# Patient Record
Sex: Female | Born: 1985 | Race: White | Hispanic: No | Smoking: Current every day smoker
Health system: Southern US, Community
[De-identification: ages and names within clinical notes are randomized; demographics above are authoritative.]

## PROBLEM LIST (undated history)

## (undated) DIAGNOSIS — F329 Major depressive disorder, single episode, unspecified: Secondary | ICD-10-CM

## (undated) DIAGNOSIS — K219 Gastro-esophageal reflux disease without esophagitis: Secondary | ICD-10-CM

## (undated) DIAGNOSIS — F909 Attention-deficit hyperactivity disorder, unspecified type: Secondary | ICD-10-CM

## (undated) DIAGNOSIS — F1910 Other psychoactive substance abuse, uncomplicated: Secondary | ICD-10-CM

## (undated) DIAGNOSIS — R011 Cardiac murmur, unspecified: Secondary | ICD-10-CM

## (undated) DIAGNOSIS — K589 Irritable bowel syndrome without diarrhea: Secondary | ICD-10-CM

## (undated) DIAGNOSIS — J343 Hypertrophy of nasal turbinates: Secondary | ICD-10-CM

## (undated) DIAGNOSIS — M26629 Arthralgia of temporomandibular joint, unspecified side: Secondary | ICD-10-CM

## (undated) DIAGNOSIS — J342 Deviated nasal septum: Secondary | ICD-10-CM

## (undated) DIAGNOSIS — K7689 Other specified diseases of liver: Secondary | ICD-10-CM

## (undated) DIAGNOSIS — Z98811 Dental restoration status: Secondary | ICD-10-CM

## (undated) DIAGNOSIS — F429 Obsessive-compulsive disorder, unspecified: Secondary | ICD-10-CM

## (undated) DIAGNOSIS — M797 Fibromyalgia: Secondary | ICD-10-CM

## (undated) DIAGNOSIS — I10 Essential (primary) hypertension: Secondary | ICD-10-CM

## (undated) DIAGNOSIS — L309 Dermatitis, unspecified: Secondary | ICD-10-CM

## (undated) DIAGNOSIS — F32A Depression, unspecified: Secondary | ICD-10-CM

## (undated) DIAGNOSIS — A64 Unspecified sexually transmitted disease: Secondary | ICD-10-CM

## (undated) DIAGNOSIS — R87619 Unspecified abnormal cytological findings in specimens from cervix uteri: Secondary | ICD-10-CM

## (undated) DIAGNOSIS — F319 Bipolar disorder, unspecified: Secondary | ICD-10-CM

## (undated) DIAGNOSIS — G43909 Migraine, unspecified, not intractable, without status migrainosus: Secondary | ICD-10-CM

## (undated) DIAGNOSIS — F419 Anxiety disorder, unspecified: Secondary | ICD-10-CM

## (undated) HISTORY — DX: Depression, unspecified: F32.A

## (undated) HISTORY — PX: COARCTATION OF AORTA REPAIR: SHX261

## (undated) HISTORY — DX: Unspecified abnormal cytological findings in specimens from cervix uteri: R87.619

## (undated) HISTORY — DX: Migraine, unspecified, not intractable, without status migrainosus: G43.909

## (undated) HISTORY — DX: Essential (primary) hypertension: I10

## (undated) HISTORY — DX: Bipolar disorder, unspecified: F31.9

## (undated) HISTORY — PX: ASD AND VSD REPAIR: SHX257

## (undated) HISTORY — DX: Anxiety disorder, unspecified: F41.9

## (undated) HISTORY — DX: Attention-deficit hyperactivity disorder, unspecified type: F90.9

## (undated) HISTORY — DX: Major depressive disorder, single episode, unspecified: F32.9

## (undated) HISTORY — DX: Gastro-esophageal reflux disease without esophagitis: K21.9

## (undated) HISTORY — DX: Obsessive-compulsive disorder, unspecified: F42.9

## (undated) HISTORY — DX: Fibromyalgia: M79.7

## (undated) HISTORY — DX: Other specified diseases of liver: K76.89

## (undated) HISTORY — DX: Cardiac murmur, unspecified: R01.1

## (undated) HISTORY — DX: Other psychoactive substance abuse, uncomplicated: F19.10

## (undated) HISTORY — DX: Irritable bowel syndrome, unspecified: K58.9

## (undated) HISTORY — DX: Unspecified sexually transmitted disease: A64

---
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
## 2007-02-05 ENCOUNTER — Ambulatory Visit: Payer: Self-pay | Admitting: *Deleted

## 2007-02-05 ENCOUNTER — Ambulatory Visit: Payer: Self-pay | Admitting: Cardiology

## 2007-02-05 ENCOUNTER — Inpatient Hospital Stay (HOSPITAL_COMMUNITY): Admission: EM | Admit: 2007-02-05 | Discharge: 2007-02-09 | Payer: Self-pay | Admitting: Emergency Medicine

## 2007-02-06 ENCOUNTER — Encounter: Payer: Self-pay | Admitting: Cardiology

## 2007-02-06 ENCOUNTER — Encounter: Payer: Self-pay | Admitting: Internal Medicine

## 2007-02-11 ENCOUNTER — Ambulatory Visit: Payer: Self-pay | Admitting: Gastroenterology

## 2007-02-11 ENCOUNTER — Ambulatory Visit: Payer: Self-pay | Admitting: Internal Medicine

## 2007-02-18 ENCOUNTER — Ambulatory Visit: Payer: Self-pay | Admitting: Gastroenterology

## 2007-04-24 DIAGNOSIS — R079 Chest pain, unspecified: Secondary | ICD-10-CM | POA: Insufficient documentation

## 2007-04-24 DIAGNOSIS — I7090 Unspecified atherosclerosis: Secondary | ICD-10-CM

## 2007-04-24 DIAGNOSIS — Q251 Coarctation of aorta: Secondary | ICD-10-CM | POA: Insufficient documentation

## 2007-04-24 DIAGNOSIS — K219 Gastro-esophageal reflux disease without esophagitis: Secondary | ICD-10-CM | POA: Insufficient documentation

## 2007-04-24 DIAGNOSIS — R143 Flatulence: Secondary | ICD-10-CM

## 2007-04-24 DIAGNOSIS — R142 Eructation: Secondary | ICD-10-CM

## 2007-04-24 DIAGNOSIS — G43909 Migraine, unspecified, not intractable, without status migrainosus: Secondary | ICD-10-CM

## 2007-04-24 DIAGNOSIS — R141 Gas pain: Secondary | ICD-10-CM | POA: Insufficient documentation

## 2007-04-24 DIAGNOSIS — Q210 Ventricular septal defect: Secondary | ICD-10-CM

## 2007-04-24 DIAGNOSIS — I10 Essential (primary) hypertension: Secondary | ICD-10-CM | POA: Insufficient documentation

## 2007-04-29 ENCOUNTER — Ambulatory Visit: Payer: Self-pay | Admitting: Internal Medicine

## 2007-05-11 ENCOUNTER — Emergency Department (HOSPITAL_COMMUNITY): Admission: EM | Admit: 2007-05-11 | Discharge: 2007-05-11 | Payer: Self-pay | Admitting: Emergency Medicine

## 2007-11-26 ENCOUNTER — Ambulatory Visit: Payer: Self-pay | Admitting: Internal Medicine

## 2007-12-08 ENCOUNTER — Ambulatory Visit: Payer: Self-pay | Admitting: Internal Medicine

## 2007-12-08 DIAGNOSIS — J309 Allergic rhinitis, unspecified: Secondary | ICD-10-CM | POA: Insufficient documentation

## 2007-12-08 DIAGNOSIS — K589 Irritable bowel syndrome without diarrhea: Secondary | ICD-10-CM | POA: Insufficient documentation

## 2007-12-08 DIAGNOSIS — Q249 Congenital malformation of heart, unspecified: Secondary | ICD-10-CM

## 2007-12-08 DIAGNOSIS — L259 Unspecified contact dermatitis, unspecified cause: Secondary | ICD-10-CM | POA: Insufficient documentation

## 2007-12-08 DIAGNOSIS — J310 Chronic rhinitis: Secondary | ICD-10-CM | POA: Insufficient documentation

## 2007-12-08 DIAGNOSIS — F329 Major depressive disorder, single episode, unspecified: Secondary | ICD-10-CM

## 2007-12-08 DIAGNOSIS — F411 Generalized anxiety disorder: Secondary | ICD-10-CM | POA: Insufficient documentation

## 2007-12-08 DIAGNOSIS — F418 Other specified anxiety disorders: Secondary | ICD-10-CM | POA: Insufficient documentation

## 2007-12-15 ENCOUNTER — Ambulatory Visit: Payer: Self-pay | Admitting: Internal Medicine

## 2007-12-15 DIAGNOSIS — B9789 Other viral agents as the cause of diseases classified elsewhere: Secondary | ICD-10-CM

## 2007-12-15 LAB — CONVERTED CEMR LAB
Inflenza A Ag: NEGATIVE
Influenza B Ag: NEGATIVE
Rapid Strep: NEGATIVE

## 2008-01-19 ENCOUNTER — Ambulatory Visit: Payer: Self-pay | Admitting: Internal Medicine

## 2008-01-19 DIAGNOSIS — F909 Attention-deficit hyperactivity disorder, unspecified type: Secondary | ICD-10-CM | POA: Insufficient documentation

## 2008-01-19 DIAGNOSIS — F988 Other specified behavioral and emotional disorders with onset usually occurring in childhood and adolescence: Secondary | ICD-10-CM | POA: Insufficient documentation

## 2008-02-07 ENCOUNTER — Ambulatory Visit: Payer: Self-pay | Admitting: Internal Medicine

## 2008-02-10 ENCOUNTER — Encounter (INDEPENDENT_AMBULATORY_CARE_PROVIDER_SITE_OTHER): Payer: Self-pay | Admitting: Internal Medicine

## 2008-02-14 ENCOUNTER — Encounter (INDEPENDENT_AMBULATORY_CARE_PROVIDER_SITE_OTHER): Payer: Self-pay | Admitting: Internal Medicine

## 2008-02-22 ENCOUNTER — Ambulatory Visit: Payer: Self-pay | Admitting: Internal Medicine

## 2008-02-22 DIAGNOSIS — R110 Nausea: Secondary | ICD-10-CM

## 2008-02-22 DIAGNOSIS — R1013 Epigastric pain: Secondary | ICD-10-CM

## 2008-02-22 LAB — CONVERTED CEMR LAB
Beta hcg, urine, semiquantitative: NEGATIVE
Blood in Urine, dipstick: NEGATIVE
Nitrite: NEGATIVE
Protein, U semiquant: NEGATIVE
WBC Urine, dipstick: NEGATIVE
pH: 7

## 2008-02-28 LAB — CONVERTED CEMR LAB
ALT: 10 units/L (ref 0–35)
AST: 13 units/L (ref 0–37)
Basophils Absolute: 0 10*3/uL (ref 0.0–0.1)
Basophils Relative: 0 % (ref 0–1)
CO2: 24 meq/L (ref 19–32)
Creatinine, Ser: 0.72 mg/dL (ref 0.40–1.20)
Eosinophils Relative: 1 % (ref 0–5)
HCT: 40.4 % (ref 36.0–46.0)
Lymphocytes Relative: 14 % (ref 12–46)
MCHC: 33.9 g/dL (ref 30.0–36.0)
Monocytes Absolute: 0.7 10*3/uL (ref 0.1–1.0)
Neutro Abs: 6 10*3/uL (ref 1.7–7.7)
Platelets: 270 10*3/uL (ref 150–400)
RDW: 13.6 % (ref 11.5–15.5)
Sodium: 137 meq/L (ref 135–145)
Total Bilirubin: 0.5 mg/dL (ref 0.3–1.2)
Total Protein: 7 g/dL (ref 6.0–8.3)

## 2008-03-01 ENCOUNTER — Ambulatory Visit (HOSPITAL_COMMUNITY): Admission: RE | Admit: 2008-03-01 | Discharge: 2008-03-01 | Payer: Self-pay | Admitting: Internal Medicine

## 2008-03-09 ENCOUNTER — Encounter (INDEPENDENT_AMBULATORY_CARE_PROVIDER_SITE_OTHER): Payer: Self-pay | Admitting: Internal Medicine

## 2008-03-16 ENCOUNTER — Ambulatory Visit: Payer: Self-pay | Admitting: Gastroenterology

## 2008-03-21 ENCOUNTER — Encounter (HOSPITAL_COMMUNITY): Admission: RE | Admit: 2008-03-21 | Discharge: 2008-03-28 | Payer: Self-pay | Admitting: Gastroenterology

## 2008-03-28 ENCOUNTER — Ambulatory Visit: Payer: Self-pay | Admitting: Licensed Clinical Social Worker

## 2008-03-29 ENCOUNTER — Ambulatory Visit: Payer: Self-pay | Admitting: Gastroenterology

## 2008-03-30 ENCOUNTER — Telehealth (INDEPENDENT_AMBULATORY_CARE_PROVIDER_SITE_OTHER): Payer: Self-pay | Admitting: Internal Medicine

## 2008-03-31 ENCOUNTER — Encounter: Payer: Self-pay | Admitting: Gastroenterology

## 2008-03-31 LAB — CONVERTED CEMR LAB: TSH: 1.299 microintl units/mL (ref 0.350–4.50)

## 2008-04-03 ENCOUNTER — Ambulatory Visit: Payer: Self-pay | Admitting: Licensed Clinical Social Worker

## 2008-04-04 ENCOUNTER — Ambulatory Visit (HOSPITAL_COMMUNITY): Admission: RE | Admit: 2008-04-04 | Discharge: 2008-04-04 | Payer: Self-pay | Admitting: Gastroenterology

## 2008-04-04 ENCOUNTER — Ambulatory Visit: Payer: Self-pay | Admitting: Gastroenterology

## 2008-04-04 ENCOUNTER — Encounter: Payer: Self-pay | Admitting: Gastroenterology

## 2008-04-04 HISTORY — PX: FLEXIBLE SIGMOIDOSCOPY: SHX1649

## 2008-04-04 HISTORY — PX: ESOPHAGOGASTRODUODENOSCOPY: SHX1529

## 2008-04-07 ENCOUNTER — Ambulatory Visit: Payer: Self-pay | Admitting: Internal Medicine

## 2008-04-07 DIAGNOSIS — T148XXA Other injury of unspecified body region, initial encounter: Secondary | ICD-10-CM | POA: Insufficient documentation

## 2008-04-13 ENCOUNTER — Telehealth (INDEPENDENT_AMBULATORY_CARE_PROVIDER_SITE_OTHER): Payer: Self-pay | Admitting: Internal Medicine

## 2008-04-14 ENCOUNTER — Ambulatory Visit: Payer: Self-pay | Admitting: Licensed Clinical Social Worker

## 2008-04-14 ENCOUNTER — Encounter (INDEPENDENT_AMBULATORY_CARE_PROVIDER_SITE_OTHER): Payer: Self-pay | Admitting: Internal Medicine

## 2008-04-28 ENCOUNTER — Encounter (HOSPITAL_COMMUNITY): Admission: RE | Admit: 2008-04-28 | Discharge: 2008-05-28 | Payer: Self-pay | Admitting: Gastroenterology

## 2008-05-03 ENCOUNTER — Encounter (INDEPENDENT_AMBULATORY_CARE_PROVIDER_SITE_OTHER): Payer: Self-pay | Admitting: Internal Medicine

## 2008-05-04 ENCOUNTER — Ambulatory Visit: Payer: Self-pay | Admitting: Licensed Clinical Social Worker

## 2008-05-05 ENCOUNTER — Ambulatory Visit: Payer: Self-pay | Admitting: Gastroenterology

## 2008-05-06 ENCOUNTER — Encounter: Payer: Self-pay | Admitting: Gastroenterology

## 2008-05-18 ENCOUNTER — Ambulatory Visit: Payer: Self-pay | Admitting: Licensed Clinical Social Worker

## 2008-05-19 ENCOUNTER — Encounter (INDEPENDENT_AMBULATORY_CARE_PROVIDER_SITE_OTHER): Payer: Self-pay | Admitting: Internal Medicine

## 2008-05-29 ENCOUNTER — Ambulatory Visit: Payer: Self-pay | Admitting: Licensed Clinical Social Worker

## 2008-06-02 ENCOUNTER — Ambulatory Visit: Payer: Self-pay | Admitting: Internal Medicine

## 2008-07-31 ENCOUNTER — Ambulatory Visit: Payer: Self-pay | Admitting: Internal Medicine

## 2008-07-31 ENCOUNTER — Telehealth (INDEPENDENT_AMBULATORY_CARE_PROVIDER_SITE_OTHER): Payer: Self-pay | Admitting: Internal Medicine

## 2008-07-31 LAB — CONVERTED CEMR LAB
Beta hcg, urine, semiquantitative: POSITIVE
Bilirubin Urine: NEGATIVE
Blood in Urine, dipstick: NEGATIVE
Glucose, Urine, Semiquant: NEGATIVE
Ketones, urine, test strip: NEGATIVE
Protein, U semiquant: 100
Urobilinogen, UA: 0.2
pH: 7

## 2008-08-01 ENCOUNTER — Telehealth: Payer: Self-pay | Admitting: Internal Medicine

## 2008-08-09 ENCOUNTER — Encounter (INDEPENDENT_AMBULATORY_CARE_PROVIDER_SITE_OTHER): Payer: Self-pay | Admitting: *Deleted

## 2008-08-22 ENCOUNTER — Encounter: Payer: Self-pay | Admitting: Internal Medicine

## 2008-08-22 ENCOUNTER — Ambulatory Visit: Payer: Self-pay

## 2008-09-01 ENCOUNTER — Ambulatory Visit: Payer: Self-pay

## 2008-10-20 ENCOUNTER — Telehealth (INDEPENDENT_AMBULATORY_CARE_PROVIDER_SITE_OTHER): Payer: Self-pay | Admitting: *Deleted

## 2008-11-21 ENCOUNTER — Encounter (INDEPENDENT_AMBULATORY_CARE_PROVIDER_SITE_OTHER): Payer: Self-pay | Admitting: *Deleted

## 2008-12-06 ENCOUNTER — Ambulatory Visit (HOSPITAL_COMMUNITY): Admission: RE | Admit: 2008-12-06 | Discharge: 2008-12-06 | Payer: Self-pay | Admitting: Obstetrics and Gynecology

## 2009-01-02 ENCOUNTER — Ambulatory Visit (HOSPITAL_COMMUNITY): Admission: RE | Admit: 2009-01-02 | Discharge: 2009-01-02 | Payer: Self-pay | Admitting: Obstetrics and Gynecology

## 2009-01-11 ENCOUNTER — Inpatient Hospital Stay (HOSPITAL_COMMUNITY): Admission: AD | Admit: 2009-01-11 | Discharge: 2009-01-12 | Payer: Self-pay | Admitting: Obstetrics and Gynecology

## 2009-04-09 ENCOUNTER — Inpatient Hospital Stay (HOSPITAL_COMMUNITY): Admission: AD | Admit: 2009-04-09 | Discharge: 2009-04-09 | Payer: Self-pay | Admitting: Obstetrics & Gynecology

## 2009-04-11 ENCOUNTER — Inpatient Hospital Stay (HOSPITAL_COMMUNITY): Admission: AD | Admit: 2009-04-11 | Discharge: 2009-04-15 | Payer: Self-pay | Admitting: Obstetrics & Gynecology

## 2009-05-21 ENCOUNTER — Ambulatory Visit: Payer: Self-pay | Admitting: Internal Medicine

## 2009-08-02 ENCOUNTER — Telehealth (INDEPENDENT_AMBULATORY_CARE_PROVIDER_SITE_OTHER): Payer: Self-pay | Admitting: *Deleted

## 2009-11-04 ENCOUNTER — Emergency Department (HOSPITAL_COMMUNITY): Admission: EM | Admit: 2009-11-04 | Discharge: 2009-11-04 | Payer: Self-pay | Admitting: Emergency Medicine

## 2009-11-14 ENCOUNTER — Ambulatory Visit (HOSPITAL_COMMUNITY): Payer: Self-pay | Admitting: Psychiatry

## 2009-12-23 ENCOUNTER — Emergency Department (HOSPITAL_COMMUNITY): Admission: EM | Admit: 2009-12-23 | Discharge: 2009-12-23 | Payer: Self-pay | Admitting: Emergency Medicine

## 2009-12-23 ENCOUNTER — Telehealth: Payer: Self-pay | Admitting: Nurse Practitioner

## 2010-01-21 ENCOUNTER — Telehealth (INDEPENDENT_AMBULATORY_CARE_PROVIDER_SITE_OTHER): Payer: Self-pay | Admitting: *Deleted

## 2010-01-21 ENCOUNTER — Ambulatory Visit: Payer: Self-pay

## 2010-01-21 ENCOUNTER — Ambulatory Visit: Payer: Self-pay | Admitting: Cardiology

## 2010-01-21 ENCOUNTER — Encounter: Payer: Self-pay | Admitting: Internal Medicine

## 2010-01-21 ENCOUNTER — Ambulatory Visit (HOSPITAL_COMMUNITY): Admission: RE | Admit: 2010-01-21 | Discharge: 2010-01-21 | Payer: Self-pay | Admitting: Internal Medicine

## 2010-01-21 ENCOUNTER — Ambulatory Visit: Payer: Self-pay | Admitting: Internal Medicine

## 2010-01-21 DIAGNOSIS — R0789 Other chest pain: Secondary | ICD-10-CM

## 2010-03-22 ENCOUNTER — Emergency Department (HOSPITAL_COMMUNITY)
Admission: EM | Admit: 2010-03-22 | Discharge: 2010-03-22 | Payer: Self-pay | Source: Home / Self Care | Admitting: Emergency Medicine

## 2010-03-27 ENCOUNTER — Telehealth: Payer: Self-pay | Admitting: Internal Medicine

## 2010-04-20 ENCOUNTER — Encounter: Payer: Self-pay | Admitting: Family Medicine

## 2010-04-20 ENCOUNTER — Encounter: Payer: Self-pay | Admitting: *Deleted

## 2010-04-30 NOTE — Progress Notes (Signed)
Summary: pt wants a call back  Medications Added CLINORIL 200 MG TABS (SULINDAC) Take 1 tablet twice a day       Phone Note Call from Patient Call back at Home Phone [phone]   Caller: Patient Reason for Call: Talk to Nurse Summary of Call: pt is pregnant and wants to know if it's ok .Marland Kitchen..pls call pt  Initial call taken by: Sydell Axon,  Aug 01, 2008 11:24 AM  Follow-up for Phone Call        Patient needs echo to evaluate heart and aorta.  I do not recomm clinoril while pregnant, category C.  Discuss with OB other options.  Needs clinic f/u after echo  PT CALLING BACK.Marland Kitchen [phone] WOULD LIKE CALLED TODAY Follow-up by: Sherrill Raring, MD, [facility],  Aug 01, 2008 2:13 PM  Additional Follow-up for Phone Call Additional follow up Details #1::        Called pt advised she needs echo and rov with Dr. Tenny Craw.. will send message to pcc to schedule. Pt. is currently NOT on any medications.    New/Updated Medications: CLINORIL 200 MG TABS (SULINDAC) Take 1 tablet twice a day

## 2010-04-30 NOTE — Progress Notes (Signed)
Summary: Psychiatry  Psychiatry   Imported By: Lutricia Horsfall LPN 16/12/9602 54:09:81  _____________________________________________________________________  External Attachment:    Type:   Image     Comment:   External Document

## 2010-04-30 NOTE — Assessment & Plan Note (Signed)
Summary: f1y per pt call/lg   Visit Type:  Follow-up Primary Provider:  Dr Latricia Heft  CC:  pt having no complaints.  History of Present Illness: Patient is a 25 year old with a history of ASD/VSD/Coarc repair.  She was last in clinic in May of 2010 when she had an echo done.  She is now postpartum approx 6 wks   She was actually seen and followed by Dr. Ambrose Mantle at [facility].  Pregnancy was uncomplicated until time of delivery.  She underwent a C Section because of fetal heart rate decelerations. She notes occasional chest pressure both during and post pregnancy.  Not associated with activity. she has had occasional dizziness but no syncope.  Current Medications (verified): 1)  Biotin 800 Mcg Tabs (Biotin) 2)  Multivitamins  Tabs (Multiple Vitamin) 3)  Triamcinolone Acetonide 0.1 % Oint (Triamcinolone Acetonide) .... Apply To Affected Area Two Times A Day 4)  Alprazolam 0.5 Mg Tabs (Alprazolam) .... 1/2 To 1 By Mouth Q 6 Hours As Needed Anxiety 5)  Fluticasone Propionate 50 Mcg/act Susp (Fluticasone Propionate) .... 2 Sprays in Each Nostril Once Daily 6)  Tylenol 325 Mg Tabs (Acetaminophen) .... As Needed 7)  Amoxicillin 500 Mg Caps (Amoxicillin) .... 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 EOMI,  PERRLA.  Neck: JVP is normal. No thyromegaly. No bruits.  Lungs: clear to auscultation. No rales no wheezes.  Heart: Regular rate and rhythm. Normal S1, S2. No S3.   No significant murmurs. PMI not displaced.  Abdomen:  Supple, nontender. Normal bowel sounds. No masses. No hepatomegaly.  Extremities:   Good distal pulses throughout. No lower extremity edema.  Musculoskeletal :moving all extremities.  Neuro:   alert and oriented x3.    EKG  Procedure date:  05/21/2009  Findings:      NSR.  74 bpm.  Nonspecific ST T wave changes.  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  Tolerated preg well.  Delivery at [facility]. BP is good.  Will f/u in 12 months with probable echo.  Problem # 2:  HYPERTENSION (ICD-401.9) BP is good.  Other Orders: EKG w/ Interpretation (93000)

## 2010-04-30 NOTE — Progress Notes (Signed)
  Phone Note Other Incoming   Request: Send information Summary of Call: Request for records received from [facility]. Request forwarded to [facility].     

## 2010-04-30 NOTE — Progress Notes (Signed)
Summary: Cardiology Phone Note - Palpitations and LH  Phone Note Call from Patient Call back at Home Phone [phone]   Caller: Patient Summary of Call: received call from pt this afternoon stating that she has had occasional palpitations over the past few days.  today while sitting she had sudden chest tightness, palpitations, and has since been lightheaded and unsteady on her feet, despite palpitations resolving.  i've advisded that she present to her local ED for evaluation.  She plans to go to [employer]. Initial call taken by: Creig Hines, ANP-BC,  December 23, 2009 5:14 PM

## 2010-04-30 NOTE — Assessment & Plan Note (Signed)
Summary: rov   Primary Provider:  Dr Latricia Heft   History of Present Illness: Patient is a 25 year old with a history of ASD/VSD/Coarc repair.  I saw her earlier this year.. The patient comes in  to be evaluated for chest tightness.  She was seen in the ER.   She said that she had tightness in her chest for days.  Continuous.  She also has felt palpitations.  Occasional dizziness but no syncope.  In the ER she was given Rx for reflux.  She said this didn't help. She also says that about 1 wk ago she began feeling better. She has been under increased stress from losing her job.  Current Medications (verified): 1)  Biotin 800 Mcg Tabs (Biotin) .Marland Kitchen.. 1 Tab By Mouth Once Daily 2)  Multivitamins  Tabs (Multiple Vitamin) 3)  Triamcinolone Acetonide 0.1 % Oint (Triamcinolone Acetonide) .... Apply To Affected Area Two Times A Day 4)  Tylenol 325 Mg Tabs (Acetaminophen) .... As Needed 5)  Amoxicillin 500 Mg Caps (Amoxicillin) .... Take As  Dental 6)  Paxil 30 Mg Tabs (Paroxetine Hcl) .Marland Kitchen.. 1 Tab By Mouth Once Daily 7)  Ativan 0.5 Mg Tabs (Lorazepam) .... As Needed  Allergies: 1)  Sulfa  Past History:  Past medical, surgical, family and social histories (including risk factors) reviewed, and no changes noted (except as noted below).  Past Medical History: Reviewed history from 12/08/2007 and no changes required. 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 Post ASD/VSD repair, coarc repair 1989 cardiac cath 2008  Family History: Reviewed history from 12/08/2007 and no changes required. father-64-A&W mother-57-hyprlipidemia, HTN, thyroid sister-24 brother-21  Social History: Reviewed history from 04/07/2008 and no changes required. Single lives with parents Smoker-1.2ppd x5  years--quit Sept 09 Alcohol use-yes-0-2 drinks, 2-4x per month Drug use-yes-hx of marijuana and pain pills          -12/08/07-smoked x1 month ago-this was first time in 2.5 years          Morphine Sulfate and oxycontin-none since age 89  Review of Systems       All systems reviewed.  Neg to the above problem except as noted above.  Vital Signs:  Patient profile:   25 year old female Height:      65 inches Weight:      196 pounds BMI:     32.73 Pulse rate:   87 / minute Resp:     12 per minute BP sitting:   120 / 80  (left arm)  Vitals Entered By: Kem Parkinson (January 21, 2010 8:30 AM)  Serial Vital Signs/Assessments:  Time      Position  BP       Pulse  Resp  Temp     By           R Arm     120/80                         Kimalexis Barnes           L Arm     118/84                         Kem Parkinson   Physical Exam  Additional Exam:  Patient is in NAD HEENT:  Normocephalic, atraumatic. EOMI, PERRLA.  Neck: JVP is normal. No thyromegaly. No bruits.  Lungs: clear to auscultation. No rales.  Tight upper airways Heart: Regular rate and rhythm. Normal S1, S2. No S3.   No significant murmurs. PMI not displaced.  Abdomen:  Supple, nontender. Normal bowel sounds. No masses. No hepatomegaly.  Extremities:   Good distal pulses throughout. No lower extremity edema.  Musculoskeletal :moving all extremities.  Neuro:   alert and oriented x3.    Impression & Recommendations:  Problem # 1:  CHEST DISCOMFORT (ICD-786.59) Paitent's symptoms are improving.  ON exam she has some chest tightness.  I wonder if indeed she had a URI.  Chiild was sick at home. I have given her an Rx for an inhaled steroid.  will go ahead and schedule an echo to reevaluate.  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 Orders: Echocardiogram (Echo)  Patient Instructions: 1)  Your physician recommends that you schedule a follow-up appointment in 12 months. 2)  Your  physician has recommended you make the following change in your medication: START taking Asmanex inhaler 1 puff daily.  3)  Your physician has requested that you have an echocardiogram.  Echocardiography is a painless test that uses sound waves to create images of your heart. It provides your doctor with information about the size and shape of your heart and how well your heart's chambers and valves are working.  This procedure takes approximately one hour. There are no restrictions for this procedure. Prescriptions: ASMANEX 60 METERED DOSES 220 MCG/INH AEPB (MOMETASONE FUROATE) Please take 1 puff daily.  #1 x 14   Entered by:   Ellender Hose RN   Authorized by:   Sherrill Raring, MD, [facility]   Signed by:   Ellender Hose RN on 01/21/2010   Method used:   Electronically to        [facility] [phone]* (retail)       [address]       [place], [place]  [address]       Ph: [phone] or [phone]       Fax: [phone]   RxID:   [id number]

## 2010-04-30 NOTE — Letter (Signed)
Summary: shot record  shot record   Imported By: Curtis Sites 02/14/2008 10:41:16  _____________________________________________________________________  External Attachment:    Type:   Image     Comment:   External Document

## 2010-04-30 NOTE — Progress Notes (Signed)
Summary: Asmanex not covered by insurance  Phone Note From Pharmacy   Caller: Luisa Hart Reason for Call: Medication not on formulary Summary of Call: Returned call from pharmacy stating Asmanex is not covered by insurance, wanted to change to Flovent HFA 110 micrograms 1 puff two times a day.  I oked the change.  Pt will pick up Rx tonight.   Initial call taken by: Robbi Garter NP-PA,  January 21, 2010 6:32 PM

## 2010-05-02 NOTE — Progress Notes (Signed)
Summary: pt request call  Phone Note Call from Patient Call back at [phone] or  [phone]   Caller: Patient Reason for Call: Talk to Nurse Initial call taken by: Judie Grieve,  March 27, 2010 10:08 AM  Follow-up for Phone Call        Called patient and she advised me that she recently saw her primary care doctor and she gave her a script for a medication to help her lose weight called ADEPEX ( PHENTERMINE) 37.5mg  to be taken every day. She will not start medication without Dr.Shaun Zuccaro' review. Advised her that I would discuss this use of this medication with Dr.Emanuela Runnion and get back to her this week or next. Patient verbalized understanding.   Layne Benton, RN, BSN  March 27, 2010 11:00 AM   Additional Follow-up for Phone Call Additional follow up Details #1::        pt states this is the correct medication adipex 35mg .   Additional Follow-up by: Roe Coombs,  March 27, 2010 11:16 AM    Additional Follow-up for Phone Call Additional follow up Details #2::    Called patient back and advised that Dr. Tenny Craw does not want her to take medication for weight loss. Recommended weight watchers or nutrasystem instead. Layne Benton, RN, BSN  April 05, 2010 4:07 PM

## 2010-06-11 ENCOUNTER — Encounter (HOSPITAL_COMMUNITY): Payer: Self-pay | Admitting: Psychiatry

## 2010-06-13 LAB — POCT CARDIAC MARKERS
CKMB, poc: <1 ng/mL — ABNORMAL LOW (ref 1.0–8.0)
CKMB, poc: <1 ng/mL — ABNORMAL LOW (ref 1.0–8.0)
Myoglobin, poc: 24.4 ng/mL (ref 12–200)
Troponin i, poc: <0.05 ng/mL (ref 0.00–0.09)

## 2010-06-13 LAB — CBC
HCT: 38.5 % (ref 36.0–46.0)
Hemoglobin: 13.2 g/dL (ref 12.0–15.0)
MCV: 81.9 fL (ref 78.0–100.0)
RBC: 4.7 MIL/uL (ref 3.87–5.11)
RDW: 13.4 % (ref 11.5–15.5)
WBC: 6 10*3/uL (ref 4.0–10.5)

## 2010-06-13 LAB — URINALYSIS, ROUTINE W REFLEX MICROSCOPIC
Bilirubin Urine: NEGATIVE
Glucose, UA: NEGATIVE mg/dL
Ketones, ur: NEGATIVE mg/dL
Protein, ur: NEGATIVE mg/dL

## 2010-06-13 LAB — BASIC METABOLIC PANEL
BUN: 8 mg/dL (ref 6–23)
Chloride: 102 mEq/L (ref 96–112)
GFR calc Af Amer: >60 mL/min (ref >60)
GFR calc non Af Amer: >60 mL/min (ref >60)
Potassium: 3.7 mEq/L (ref 3.5–5.1)
Sodium: 137 mEq/L (ref 135–145)

## 2010-06-13 LAB — PREGNANCY, URINE: Preg Test, Ur: NEGATIVE

## 2010-06-13 LAB — DIFFERENTIAL
Basophils Absolute: 0 10*3/uL (ref 0.0–0.1)
Lymphocytes Relative: 18 % (ref 12–46)
Lymphs Abs: 1.1 10*3/uL (ref 0.7–4.0)
Monocytes Absolute: 0.5 10*3/uL (ref 0.1–1.0)
Neutro Abs: 4.4 10*3/uL (ref 1.7–7.7)

## 2010-06-16 LAB — CBC
HCT: 26.9 % — ABNORMAL LOW (ref 36.0–46.0)
Hemoglobin: 9 g/dL — ABNORMAL LOW (ref 12.0–15.0)
MCHC: 33.7 g/dL (ref 30.0–36.0)
MCV: 79.1 fL (ref 78.0–100.0)
MCV: 79.3 fL (ref 78.0–100.0)
Platelets: 191 10*3/uL (ref 150–400)
Platelets: 250 10*3/uL (ref 150–400)
RBC: 4.32 MIL/uL (ref 3.87–5.11)
RDW: 15.2 % (ref 11.5–15.5)
RDW: 15.3 % (ref 11.5–15.5)

## 2010-06-16 LAB — COMPREHENSIVE METABOLIC PANEL
ALT: 11 U/L (ref 0–35)
AST: 17 U/L (ref 0–37)
Calcium: 9.2 mg/dL (ref 8.4–10.5)
Creatinine, Ser: 0.47 mg/dL (ref 0.4–1.2)
GFR calc Af Amer: >60 mL/min (ref >60)
GFR calc non Af Amer: >60 mL/min (ref >60)
Sodium: 134 mEq/L — ABNORMAL LOW (ref 135–145)
Total Protein: 5.9 g/dL — ABNORMAL LOW (ref 6.0–8.3)

## 2010-06-16 LAB — DIFFERENTIAL
Basophils Absolute: 0 10*3/uL (ref 0.0–0.1)
Eosinophils Absolute: 0 10*3/uL (ref 0.0–0.7)
Lymphocytes Relative: 7 % — ABNORMAL LOW (ref 12–46)
Monocytes Absolute: 0.5 10*3/uL (ref 0.1–1.0)
Neutrophils Relative %: 87 % — ABNORMAL HIGH (ref 43–77)

## 2010-06-16 LAB — RPR: RPR Ser Ql: NONREACTIVE

## 2010-06-16 LAB — RH IMMUNE GLOB WKUP(>/=20WKS)(NOT WOMEN'S HOSP)

## 2010-06-25 ENCOUNTER — Encounter (INDEPENDENT_AMBULATORY_CARE_PROVIDER_SITE_OTHER): Payer: Medicaid Other | Admitting: Psychiatry

## 2010-06-25 DIAGNOSIS — F39 Unspecified mood [affective] disorder: Secondary | ICD-10-CM

## 2010-06-25 DIAGNOSIS — F411 Generalized anxiety disorder: Secondary | ICD-10-CM

## 2010-06-26 ENCOUNTER — Emergency Department (HOSPITAL_COMMUNITY): Payer: Medicaid Other

## 2010-06-26 ENCOUNTER — Emergency Department (HOSPITAL_COMMUNITY)
Admission: EM | Admit: 2010-06-26 | Discharge: 2010-06-27 | Disposition: A | Discharge status: Home / Self Care | Payer: Medicaid Other | Attending: Emergency Medicine | Admitting: Emergency Medicine

## 2010-06-26 DIAGNOSIS — Z9889 Other specified postprocedural states: Secondary | ICD-10-CM | POA: Insufficient documentation

## 2010-06-26 DIAGNOSIS — R0609 Other forms of dyspnea: Secondary | ICD-10-CM | POA: Insufficient documentation

## 2010-06-26 DIAGNOSIS — F17200 Nicotine dependence, unspecified, uncomplicated: Secondary | ICD-10-CM | POA: Insufficient documentation

## 2010-06-26 DIAGNOSIS — R5381 Other malaise: Secondary | ICD-10-CM | POA: Insufficient documentation

## 2010-06-26 DIAGNOSIS — R509 Fever, unspecified: Secondary | ICD-10-CM | POA: Insufficient documentation

## 2010-06-26 DIAGNOSIS — R799 Abnormal finding of blood chemistry, unspecified: Secondary | ICD-10-CM | POA: Insufficient documentation

## 2010-06-26 DIAGNOSIS — M79609 Pain in unspecified limb: Secondary | ICD-10-CM | POA: Insufficient documentation

## 2010-06-26 DIAGNOSIS — R0989 Other specified symptoms and signs involving the circulatory and respiratory systems: Secondary | ICD-10-CM | POA: Insufficient documentation

## 2010-06-26 DIAGNOSIS — R5383 Other fatigue: Secondary | ICD-10-CM | POA: Insufficient documentation

## 2010-06-26 DIAGNOSIS — Z79899 Other long term (current) drug therapy: Secondary | ICD-10-CM | POA: Insufficient documentation

## 2010-06-26 DIAGNOSIS — R0602 Shortness of breath: Secondary | ICD-10-CM | POA: Insufficient documentation

## 2010-06-26 LAB — URINALYSIS, ROUTINE W REFLEX MICROSCOPIC
Bilirubin Urine: NEGATIVE
Ketones, ur: NEGATIVE mg/dL
Leukocytes, UA: NEGATIVE
Nitrite: NEGATIVE
Specific Gravity, Urine: 1.02 (ref 1.005–1.030)
Urobilinogen, UA: 1 mg/dL (ref 0.0–1.0)
pH: 6.5 (ref 5.0–8.0)

## 2010-06-26 LAB — DIFFERENTIAL
Basophils Absolute: 0.2 10*3/uL — ABNORMAL HIGH (ref 0.0–0.1)
Basophils Relative: 4 % — ABNORMAL HIGH (ref 0–1)
Eosinophils Absolute: 0 10*3/uL (ref 0.0–0.7)
Monocytes Absolute: 0.3 10*3/uL (ref 0.1–1.0)
Monocytes Relative: 5 % (ref 3–12)
Neutro Abs: 2 10*3/uL (ref 1.7–7.7)
Neutrophils Relative %: 36 % — ABNORMAL LOW (ref 43–77)

## 2010-06-26 LAB — COMPREHENSIVE METABOLIC PANEL
ALT: 31 U/L (ref 0–35)
AST: 31 U/L (ref 0–37)
Albumin: 3.4 g/dL — ABNORMAL LOW (ref 3.5–5.2)
CO2: 25 mEq/L (ref 19–32)
Calcium: 8.5 mg/dL (ref 8.4–10.5)
Creatinine, Ser: 0.85 mg/dL (ref 0.4–1.2)
GFR calc Af Amer: >60 mL/min (ref >60)
Sodium: 133 mEq/L — ABNORMAL LOW (ref 135–145)

## 2010-06-26 LAB — CBC
Hemoglobin: 12.5 g/dL (ref 12.0–15.0)
MCH: 29.1 pg (ref 26.0–34.0)
MCHC: 33.6 g/dL (ref 30.0–36.0)
Platelets: 230 10*3/uL (ref 150–400)
RBC: 4.29 MIL/uL (ref 3.87–5.11)

## 2010-06-26 LAB — PREGNANCY, URINE: Preg Test, Ur: NEGATIVE

## 2010-06-27 ENCOUNTER — Emergency Department (HOSPITAL_COMMUNITY): Payer: Medicaid Other

## 2010-06-27 MED ORDER — IOHEXOL 350 MG/ML SOLN
100.0000 mL | Freq: Once | INTRAVENOUS | Status: AC | PRN
  Administered 2010-06-27: 100 mL via INTRAVENOUS

## 2010-07-01 ENCOUNTER — Telehealth: Payer: Self-pay | Admitting: Internal Medicine

## 2010-07-01 NOTE — Telephone Encounter (Signed)
Called patient back. She is complaining about having SOB with exertion times 2 weeks. She states that she went to her primary care doctor and he felt  that her SOB was stress related. She went to th ER recently and a PE was ruled out per the patient and she was advised to follow up with cardiology. Advised her to see Dr.Ross on 4/5.

## 2010-07-01 NOTE — Telephone Encounter (Signed)
LMOM for call back. 

## 2010-07-01 NOTE — Telephone Encounter (Signed)
Pt calling having sob x 2 weeks

## 2010-07-01 NOTE — Telephone Encounter (Signed)
Pt rtn call 

## 2010-07-03 ENCOUNTER — Encounter (INDEPENDENT_AMBULATORY_CARE_PROVIDER_SITE_OTHER): Payer: Medicaid Other | Admitting: Psychiatry

## 2010-07-03 DIAGNOSIS — F39 Unspecified mood [affective] disorder: Secondary | ICD-10-CM

## 2010-07-04 ENCOUNTER — Ambulatory Visit (INDEPENDENT_AMBULATORY_CARE_PROVIDER_SITE_OTHER): Payer: Medicaid Other | Admitting: Internal Medicine

## 2010-07-04 ENCOUNTER — Encounter: Payer: Self-pay | Admitting: Internal Medicine

## 2010-07-04 DIAGNOSIS — Z954 Presence of other heart-valve replacement: Secondary | ICD-10-CM

## 2010-07-04 DIAGNOSIS — R0789 Other chest pain: Secondary | ICD-10-CM

## 2010-07-04 DIAGNOSIS — R0609 Other forms of dyspnea: Secondary | ICD-10-CM

## 2010-07-04 DIAGNOSIS — Q251 Coarctation of aorta: Secondary | ICD-10-CM

## 2010-07-04 DIAGNOSIS — I7090 Unspecified atherosclerosis: Secondary | ICD-10-CM

## 2010-07-04 LAB — URINALYSIS, ROUTINE W REFLEX MICROSCOPIC
Glucose, UA: NEGATIVE mg/dL
Ketones, ur: NEGATIVE mg/dL
Nitrite: NEGATIVE
Protein, ur: NEGATIVE mg/dL
Urobilinogen, UA: 0.2 mg/dL (ref 0.0–1.0)

## 2010-07-04 LAB — CBC
Hemoglobin: 10.7 g/dL — ABNORMAL LOW (ref 12.0–15.0)
MCV: 87.7 fL (ref 78.0–100.0)
RBC: 3.7 MIL/uL — ABNORMAL LOW (ref 3.87–5.11)
WBC: 8 10*3/uL (ref 4.0–10.5)

## 2010-07-04 LAB — GC/CHLAMYDIA PROBE AMP, GENITAL: GC Probe Amp, Genital: NEGATIVE

## 2010-07-04 LAB — URINE CULTURE: Colony Count: 3000

## 2010-07-04 LAB — ABO/RH: ABO/RH(D): O NEG

## 2010-07-04 LAB — URINE MICROSCOPIC-ADD ON

## 2010-07-04 LAB — WET PREP, GENITAL: Clue Cells Wet Prep HPF POC: NONE SEEN

## 2010-07-04 MED ORDER — ALBUTEROL 90 MCG/ACT IN AERS: 2.0000 | INHALATION_SPRAY | Freq: Four times a day (QID) | RESPIRATORY_TRACT | Status: DC | PRN

## 2010-07-04 MED ORDER — OMEPRAZOLE 20 MG PO CPDR: 20.0000 mg | DELAYED_RELEASE_CAPSULE | Freq: Two times a day (BID) | ORAL | Status: DC

## 2010-07-04 NOTE — Assessment & Plan Note (Signed)
S/p repair.  No residual

## 2010-07-04 NOTE — Progress Notes (Signed)
HPI Patient is a 25 year old with a history of ASD/VSD/COarc repair.  I saw her last year.  Had CP then.  Was atypical.  Echo showed no evidence  Of defect.  Peak gradient through the coarc is 2.3 m/sec. She presents today with a 4 wk history history of SOB.  CHest tightness.  She was seen in [facility] ER on 3/29   CT of chest was neg for PE.  She was sent out. She continues to complain of SOB with activity.   Used 1 puff of Qvar 1 1/2 wks ago. Allergies  Allergen Reactions  . Sulfonamide Derivatives     Current Outpatient Prescriptions  Medication Sig Dispense Refill  . acetaminophen (TYLENOL) 325 MG tablet Take 650 mg by mouth every 6 (six) hours as needed.        Marland Kitchen amoxicillin (AMOXIL) 500 MG capsule 500 mg. As directed       . Biotin 800 MCG TABS Take by mouth.        . levonorgestrel-ethinyl estradiol (NORDETTE) 0.15-30 MG-MCG per tablet Take 1 tablet by mouth daily. Birth control       . LORazepam (ATIVAN) 0.5 MG tablet Take 0.5 mg by mouth every 8 (eight) hours.        . mometasone (ASMANEX) 220 MCG/INH inhaler Inhale 2 puffs into the lungs daily.        . Multiple Vitamin (MULTIVITAMIN) tablet Take 1 tablet by mouth daily.        Marland Kitchen PARoxetine (PAXIL) 30 MG tablet Take 30 mg by mouth every morning.        . triamcinolone (KENALOG) 0.1 % ointment Apply topically 2 (two) times daily.          Past Medical History  Diagnosis Date  . Esophageal reflux   . Migraine, unspecified, without mention of intractable migraine without mention of status migrainosus   . Hypertension   . IBS (irritable bowel syndrome)   . Allergic rhinitis   . Anxiety   . Depression     Past Surgical History  Procedure Date  . Post asd/vsd repair ,coarc repair 1989   . Cath cardiac 2008     No family history on file.  History   Social History  . Marital Status: Single    Spouse Name: N/A    Number of Children: N/A  . Years of Education: N/A   Occupational History  . Not on file.   Social History  Main Topics  . Smoking status: Former Games developer  . Smokeless tobacco: Former Neurosurgeon    Quit date: 05/30/2010  . Alcohol Use: Not on file  . Drug Use: Not on file  . Sexually Active: Not on file   Other Topics Concern  . Not on file   Social History Narrative  . No narrative on file    Review of Systems:  All systems reviewed.  They are negative to the above problem except as previously stated.  Vital Signs: BP 117/77  Pulse 89  Ht 5\' 4"  (1.626 m)  Wt 208 lb (94.348 kg)  BMI 35.70 kg/m2  LMP 03/13/2010  Physical Exam  HEENT:  Normocephalic, atraumatic. EOMI, PERRLA.  Neck: JVP is normal. No thyromegaly. No bruits.  Lungs: Upper airway wheeze.  MOving air well otherwise. Heart: Regular rate and rhythm. Normal S1, S2. No S3.   No significant murmurs. PMI not displaced.  Abdomen:  Supple, nontender. Normal bowel sounds. No masses. No hepatomegaly.  Extremities:   Good distal  pulses throughout. No lower extremity edema.  Musculoskeletal :moving all extremities.  Neuro:   alert and oriented x3.  CN II-XII grossly intact.  WIth walking sats stay 99 going down to 97%  HR with 118 after walking   EKG:  SR  64.  1st degree av block. Assessment and Plan:

## 2010-07-04 NOTE — Patient Instructions (Signed)
Your physician recommends that you schedule a follow-up appointment in: 12 months with Dr. Tenny Craw. Your physician has recommended you make the following change in your medication: Use albuterol inhaler 2 puffs every 6 hours as needed for wheezing.  Start omeprazole 20 mg by mouth twice daily.

## 2010-07-04 NOTE — Assessment & Plan Note (Signed)
Mild gradient by echo this winter.  I do not think contrib to symptoms.

## 2010-07-04 NOTE — Assessment & Plan Note (Signed)
Patient has mild chest discomfort or tightness that I think is mainly due to pulmonary problems.  She has some pseudowheezing on exam. I would recomm a trial of omeprazole 20 bid.  I have given her an Rx  For albuterol to use as needed.  She has only used Qvar 1 x in past 1 1/2 wks.  Don't use now.  Will get PFTs Monday and F/U with K Clance next wk

## 2010-07-08 ENCOUNTER — Ambulatory Visit (INDEPENDENT_AMBULATORY_CARE_PROVIDER_SITE_OTHER): Payer: Medicaid Other | Admitting: Pulmonary Disease

## 2010-07-08 DIAGNOSIS — R0609 Other forms of dyspnea: Secondary | ICD-10-CM

## 2010-07-08 LAB — PULMONARY FUNCTION TEST

## 2010-07-08 NOTE — Progress Notes (Signed)
PFT done today. 

## 2010-07-09 ENCOUNTER — Encounter: Payer: Self-pay | Admitting: Pulmonary Disease

## 2010-07-10 ENCOUNTER — Encounter (INDEPENDENT_AMBULATORY_CARE_PROVIDER_SITE_OTHER): Payer: Medicaid Other | Admitting: Psychiatry

## 2010-07-10 DIAGNOSIS — F39 Unspecified mood [affective] disorder: Secondary | ICD-10-CM

## 2010-07-11 ENCOUNTER — Ambulatory Visit (INDEPENDENT_AMBULATORY_CARE_PROVIDER_SITE_OTHER): Payer: Medicaid Other | Admitting: Pulmonary Disease

## 2010-07-11 ENCOUNTER — Encounter: Payer: Self-pay | Admitting: Pulmonary Disease

## 2010-07-11 VITALS — BP 114/70 | HR 103 | Temp 98.4°F | Ht 65.0 in | Wt 209.8 lb

## 2010-07-11 DIAGNOSIS — R0600 Dyspnea, unspecified: Secondary | ICD-10-CM

## 2010-07-11 DIAGNOSIS — R0989 Other specified symptoms and signs involving the circulatory and respiratory systems: Secondary | ICD-10-CM

## 2010-07-11 NOTE — Progress Notes (Signed)
  Subjective:    Patient ID: Kristin Stephens, female    DOB: 1985/10/21, 25 y.o.   MRN: [id number]  HPI The pt is a 25y/o female who I have been asked to see for dyspnea.  She has known congenital heart disease, but has done quite well with surgery as an infant.  She has mild chronic doe (but is obese as well), but approx one month ago began to notice worsening fatigue, doe, and chest tightness.  She currently describes a 3block doe or less at moderate pace, and will also get winded bringing groceries in from the car.  Her chest tightness comes and goes, and is not necessarily exertion related.  She has minimal cough with no significant mucus.  She denies any LE edema of significance.  She has no h/o asthma, but does have seasonal allergies.  She has been tried on albuterol as needed, and thinks she may be better.  She is using about 2 times a day.  She has been tried on PPI for reflux, and feels the chest tightness is better, but the breathing is no different.  She has had a recent ct chest last month that was unremarkable, and her echo last year was stable.  She did not desaturate with ambulation at last visit with Dr. Tenny Craw.  She has had pfts today which show no obstruction, mild restriction, and a moderate reduction in DLCO which corrects with Av.     Review of Systems  Constitutional: Positive for unexpected weight change. Negative for fever.  HENT: Positive for congestion. Negative for ear pain, nosebleeds, sore throat, rhinorrhea, sneezing, trouble swallowing, dental problem, postnasal drip and sinus pressure.   Eyes: Negative for redness and itching.  Respiratory: Positive for cough and shortness of breath. Negative for chest tightness and wheezing.   Cardiovascular: Positive for chest pain and palpitations.  Gastrointestinal: Negative for nausea and vomiting.  Genitourinary: Negative for dysuria.  Musculoskeletal: Positive for joint swelling.  Skin: Negative for rash.  Neurological:  Positive for headaches.  Hematological: Does not bruise/bleed easily.  Psychiatric/Behavioral: Positive for dysphoric mood. The patient is not nervous/anxious.        Objective:   Physical Exam Constitutional:  Obese female, no acute distress  HENT:  Nares patent without discharge  Oropharynx without exudate, palate and uvula are normal  Eyes:  Perrla, eomi, no scleral icterus  Neck:  No JVD, no TMG  Cardiovascular:  Normal rate, regular rhythm, no rubs or gallops.  No obvious murmur heard (surprisingly)        Intact distal pulses  Pulmonary :  Normal breath sounds, no stridor or respiratory distress   No rales, rhonchi, or wheezing  Abdominal:  Soft, nondistended, bowel sounds present.  No tenderness noted.   Musculoskeletal:  No lower extremity edema noted.  Lymph Nodes:  No cervical lymphadenopathy noted  Skin:  No cyanosis noted  Neurologic:  Alert, appropriate, moves all 4 extremities without obvious deficit.         Assessment & Plan:

## 2010-07-11 NOTE — Patient Instructions (Signed)
Ok to use albuterol as needed, but do not use qvar or asmanex Will schedule for methacholine challenge testing, and call you with results.

## 2010-07-15 ENCOUNTER — Encounter: Payer: Self-pay | Admitting: Pulmonary Disease

## 2010-07-15 NOTE — Assessment & Plan Note (Signed)
The pt has worsening doe over the last 4 weeks, along with chest tightness that may or may not be related.  Nothing has been found on w/u so far.  Her chest tightness has improved with PPI, but not her sob.  I have asked her to continue with this.  She thinks SABA may have helped her breathing, but PFTS show no airflow obstruction.  Of course, this does not exclude possible asthma.  I think her decreased lung volumes are due to her obesity (50 pound weight gain in one year), and her DLCO can be due to this as well given her correction with alveolar volume adjustment.  Because of the complicated nature of the pt with her congenital heart disease, I think we need to put the issue of asthma to rest.  If it is not this, then further evaluation may be necessary.  Will schedule for methacholine challenge testing, and have her f/u afterwards.

## 2010-07-19 ENCOUNTER — Ambulatory Visit (HOSPITAL_COMMUNITY)
Admission: RE | Admit: 2010-07-19 | Discharge: 2010-07-19 | Disposition: A | Discharge status: Home / Self Care | Payer: Medicaid Other | Source: Ambulatory Visit | Attending: Pulmonary Disease | Admitting: Pulmonary Disease

## 2010-07-19 ENCOUNTER — Emergency Department (HOSPITAL_COMMUNITY)
Admission: EM | Admit: 2010-07-19 | Discharge: 2010-07-19 | Disposition: A | Discharge status: Home / Self Care | Payer: Medicaid Other | Attending: Emergency Medicine | Admitting: Emergency Medicine

## 2010-07-19 DIAGNOSIS — R0602 Shortness of breath: Secondary | ICD-10-CM | POA: Insufficient documentation

## 2010-07-19 DIAGNOSIS — F411 Generalized anxiety disorder: Secondary | ICD-10-CM | POA: Insufficient documentation

## 2010-07-19 DIAGNOSIS — J9801 Acute bronchospasm: Secondary | ICD-10-CM | POA: Insufficient documentation

## 2010-07-19 DIAGNOSIS — R079 Chest pain, unspecified: Secondary | ICD-10-CM | POA: Insufficient documentation

## 2010-07-19 DIAGNOSIS — I498 Other specified cardiac arrhythmias: Secondary | ICD-10-CM | POA: Insufficient documentation

## 2010-07-23 ENCOUNTER — Institutional Professional Consult (permissible substitution): Payer: Medicaid Other | Admitting: Internal Medicine

## 2010-07-24 ENCOUNTER — Telehealth: Payer: Self-pay | Admitting: Pulmonary Disease

## 2010-07-24 ENCOUNTER — Encounter (INDEPENDENT_AMBULATORY_CARE_PROVIDER_SITE_OTHER): Payer: Medicaid Other | Admitting: Psychiatry

## 2010-07-24 DIAGNOSIS — F411 Generalized anxiety disorder: Secondary | ICD-10-CM

## 2010-07-24 DIAGNOSIS — F39 Unspecified mood [affective] disorder: Secondary | ICD-10-CM

## 2010-07-24 NOTE — Telephone Encounter (Signed)
Spoke with patient-states she had Methacholine challenge on 07-19-10; states she is trying to return to work asap but states after having test she had to get a breathing tx and then had an attack after that and was able to use her rescue inhaler to help relieve the attack. Pt is concerned and would like results as soon as possible. Please advise.

## 2010-07-24 NOTE — Telephone Encounter (Signed)
Have not seen the results.  They usu take a long time getting this to [place].  Please have them fax over asap whether it is read or not.

## 2010-07-24 NOTE — Telephone Encounter (Signed)
Please advise getting pt meth challenge results. Thanks

## 2010-07-25 ENCOUNTER — Telehealth: Payer: Self-pay | Admitting: Internal Medicine

## 2010-07-25 ENCOUNTER — Ambulatory Visit (INDEPENDENT_AMBULATORY_CARE_PROVIDER_SITE_OTHER): Payer: Medicaid Other | Admitting: Psychiatry

## 2010-07-25 DIAGNOSIS — F3189 Other bipolar disorder: Secondary | ICD-10-CM

## 2010-07-25 NOTE — Telephone Encounter (Signed)
Pt has question re meds. Pt would like to talk to a nurse.

## 2010-07-25 NOTE — Group Therapy Note (Signed)
NAME:  Kristin Stephens, Kristin Stephens              ACCOUNT NO.:  [id number]  MEDICAL RECORD NO.:  [id number]           PATIENT TYPE:  A  LOCATION:  [facility]                           FACILITY:  [facility]  PHYSICIAN:  Syed T. Arfeen, M.D.        DATE OF BIRTH:                                PROGRESS NOTE   The patient is a 25 year old Caucasian unemployed female.  The patient has been seen by our therapist in this office for a long time.  She had stopped coming until very recently she restarted counseling due to increased mood swings, depression and anxiety.  The patient admitted that she has long history of psychiatric illness and has been taking Paxil through her primary care doctor.  However, now she is not sure that she is on the right medication.  She wanted to come off from Paxil but also endorsed increased agitation, anger, mood swings and poor sleep.  She was started on Paxil when she was going through severe postpartum depression 14 months ago.  She mentioned at that time she was having a lot of social isolation, either sleeping too much or not enough, and did not want to be around anyone but her daughter or spending all day with the daughter.  At that time she also noted yelling, cursing and agitated and wanted to hurt her boyfriend.  She was started on Paxil which help her depression but she also noticed that she has been more moody and irritable.  She also endorsed difficult relationship with her mother who initially did not like that she was pregnant without marriage but supported her during the pregnancy and the patient admitted that without her she could not have handled the pregnancy and postpartum depression.  She has been seeing Peggy since then and now she realized that she needs either a different medication to help her mood swings.  The patient denies any auditory hallucinations, suicidal thoughts or homicidal thoughts but endorsed she has burst of energy at times when she is spending too  much time on cleaning,  eating  impulsively and shopping unreasonably.  She admitted that she also gets into a road rage and has been pulled over 6 times but only got ticket one time for speeding.  She realizes that people have noticed that she has severe mood swings.  Lately she has been so anxious and irritable that she is biting her nails to the point that they start bleeding. She also has history of self-abusive behavior in her teens when she cut herself with a  razor.  However, she never required any stitches.  Currently the patient is on 40 mg of Paxil and takes Ativan on a p.r.n. basis.  PAST PSYCHIATRIC HISTORY: As mentioned above the patient has been seeing a therapist in this office when she was only 25 years old.  At that time she mentioned that she was very depressed having a lot of tension and concentration problems and going through the rage.  She was cutting herself with a razor blade.  At age 87 she stopped that self-abusive behavior.  She mentioned at that time her  grandmother was dying and  she could not handle the stress very well.  She was very close to her grandmother whoactually raised her since she was very young.  She denies any history of psychiatric inpatient treatment.  However, she has been seen by psychiatrist when she was admitted on the medical floor and prescribed Wellbutrin but she did not like it.  PSYCHOSOCIAL HISTORY: The patient reported that when she was only 89 years old her father left her mom and she was actually raised by her mom and her grandmother.  She admitted that her father was never in her life and she tried to write him a letter but her letter was sent back without father to open it. She had lot of resentment and anger towards her father. But in time has been able to manage that anger.  She was very close to her  grandmother when she died at age 31.  She admitted that she was in a severe rage and depression.  She is mother of a  45-month-old daughter.  She denies history of getting her but admitted having a difficult relationship with her boyfriend, but for the past few months her boyfriend has been more involved in taking care of the daughter.  The patient currently lives with her mother and her relationship with the mother has been improving.  FAMILY HISTORY: The patient endorsed significant psychiatric illness runs on the father's side, but she did not know the details.  MEDICAL HISTORY: The patient has history of ASD,  VSD, operation of the aorta,  migraine,  and reflux esophagitis.  She also had a cardiac catheterization in 2008. She sees a cardiologist, Dr. Dietrich Pates but she is not prescribed any  medication.  Her primary care doctor is Dr.Luking  She takes inhaler, birth control pill and Ativan 0.5 mg as needed and Paxil 40 mg daily.  ALLERGIES: She is allergic to sulfa.  SUBSTANCE ABUSE: The patient endorsed history of using pot, drugs and alcohol in her teens when she was self-medicating for her depression and anxiety, though she had to stop all the drugs, she continued to drink on and off but denies any history of DWI, tremors, blackout or seizures.  EDUCATION AND WORK HISTORY: The patient has GED.  She has difficulty in the school with attention and concentration and has formally tested by Dr. Jiles Garter in this office. 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  MENTAL STATUS EXAM: The patient is mildly obese young female who is casually dressed.  She is very cooperative but anxious.  Her speech is at times fast and rapid but clear and coherent.  Her thought process was logical, linear and goal-directed.  She denies any auditory hallucinations, suicidal thoughts or homicidal thoughts.  Her attention and concentration were distracted at times as she was more emotional and tearful when she was talking about her father and grandmother.   However, there were no psychosis present.  She described her mood as anxious and affect was constricted.  There were no paranoia or delusion present at this time she is alert and oriented x3.  Her insight, judgment, impulse control were okay.  DIAGNOSIS: AXIS I:  Mood disorder NOS, rule out bipolar disorder depressed type, rule out major depressive disorder recurrent, possibly alcohol abuse. AXIS II:  Deferred. AXIS III:  See medical history. AXIS IV:  Mild to moderate. AXIS V:  55-65.  PLAN: At this time  the patient is requesting to change her medication as she feels that Paxil is not helping her depression but also causing more agitation and irritable.  I had talked in length about her underlying diagnosis, possible bipolar disorder which the patient believes, given the relevant facts and history which she had in past few years.  We talked about cutting down the Paxil from 40-30 mg to see if lowering the Paxil can help her moodiness and anger.  We also talked about starting the Lamictal.  However, the patient will discuss this medication with her cardiologist, if it is safe to take given the fact she has ASD and VSD and coarctation of  aorta.  We will also talk about continued counselling with a therapist in this office.  At this time we will reduce the Paxil to 30 mg and I also asked to take the Paxil during the day, believing that taking the Paxil at the night time causing insomnia. I will see her again in 2 weeks.  I  explained the risks and benefits of medication.  I have recommended in crisis call 9-1-1 or go to the local ER which she acknowledged.  Will see her again in 2 weeks.     Syed T. Lolly Mustache, M.D.     STA/MEDQ  D:  07/25/2010  T:  07/25/2010  Job:  [id number]  Electronically Signed by Kathryne Sharper M.D. on 07/25/2010 02:46:33 PM

## 2010-07-25 NOTE — Telephone Encounter (Signed)
Spoke with pt who reports she saw Dr. Peggyann Shoals at [facility] yesterday. Per pt Dr. Peggyann Shoals would like to wean pt off Paxil and then start Lamictal daily but would like Dr. Tenny Craw' input on this change in medication.  Pt is due to see Dr. Peggyann Shoals again in 2 weeks and start new medication at that time if OK with Dr. Tenny Craw. I told pt I would forward to Dr. Tenny Craw to review.

## 2010-07-26 NOTE — Telephone Encounter (Signed)
Received results and put in KC's very important look at folder for him to review.  

## 2010-07-26 NOTE — Telephone Encounter (Signed)
Please let her know the methacholine challenge was positive See if we can call in symbicort 160/4.5  2 inhalations am and pm.  Rinse mouth well. Need to see her in 3 weeks to review test with her on site and see how she has responded.

## 2010-07-26 NOTE — Telephone Encounter (Signed)
[id number] 

## 2010-07-29 ENCOUNTER — Encounter: Payer: Self-pay | Admitting: Pulmonary Disease

## 2010-07-29 MED ORDER — BUDESONIDE-FORMOTEROL FUMARATE 160-4.5 MCG/ACT IN AERO: 2.0000 | INHALATION_SPRAY | Freq: Two times a day (BID) | RESPIRATORY_TRACT | Status: DC

## 2010-07-29 NOTE — Telephone Encounter (Signed)
Called and spoke with pt.  Pt aware of MCT results and KC's recs.  Rx sent to pt's pharmacy of choice:  [facility] in [place].  Pt scheduled 3 week f/u appt for Friday May 25 at 9:45am.

## 2010-07-31 ENCOUNTER — Encounter (INDEPENDENT_AMBULATORY_CARE_PROVIDER_SITE_OTHER): Payer: Medicaid Other | Admitting: Psychiatry

## 2010-07-31 DIAGNOSIS — F39 Unspecified mood [affective] disorder: Secondary | ICD-10-CM

## 2010-08-05 NOTE — Telephone Encounter (Signed)
Called patient and she is aware that Dr.Ross is OK with medication change.

## 2010-08-05 NOTE — Telephone Encounter (Signed)
I think it is OK for her to try the Lamictel.  Wean Paxil.

## 2010-08-07 ENCOUNTER — Encounter (INDEPENDENT_AMBULATORY_CARE_PROVIDER_SITE_OTHER): Payer: Medicaid Other | Admitting: Psychiatry

## 2010-08-07 DIAGNOSIS — F39 Unspecified mood [affective] disorder: Secondary | ICD-10-CM

## 2010-08-08 ENCOUNTER — Encounter (INDEPENDENT_AMBULATORY_CARE_PROVIDER_SITE_OTHER): Payer: Medicaid Other | Admitting: Psychiatry

## 2010-08-08 DIAGNOSIS — F39 Unspecified mood [affective] disorder: Secondary | ICD-10-CM

## 2010-08-13 NOTE — Consult Note (Signed)
NAME:  Kristin Stephens, Kristin Stephens NO.:  [id number]   MEDICAL RECORD NO.:  [id number]          PATIENT TYPE:  AMB   LOCATION:  DAY                           FACILITY:  [facility]   PHYSICIAN:  Kassie Mends, M.D.      DATE OF BIRTH:  [birth year]   DATE OF CONSULTATION:  DATE OF DISCHARGE:                                 CONSULTATION   REFERRING [name]:  Erle Crocker, MD   PROBLEM LIST:  1. Rectal bleeding.  2. Abdominal pain and nausea for a year.  3. Depression.  4. Anxiety.  5. Migraines.  6. History of repair of ASD and VSD, as well as coarctation of the      aorta.  7. Chest pain evaluated in 2008, found to be non-erosive reflux      disease and treated unsuccessfully with Protonix and Kapidex.  8. Cardiac catheterization in 2008.   SUBJECTIVE:  Kristin Stephens is a 25 year old female who was initially seen  on March 16, 2008.  A question was whether or not she had biliary  dyskinesia.  She had extensive evaluation in 2008, for chest pain which  included a CTA of the chest and abdomen.  She had no acute intra-  abdominal process.  The vasculature showed postsurgical changes in the  thoracic aorta and no evidence of abdominal aortic aneurysm or  dissection.  She states that this summer there was a possibility that  she has H1N1 virus.  She reports of not being confirmed and she did  receive the vaccination.  She received a flu shot and pneumonia shot at  the same time.  She says that before Thanksgiving she would have  problems intermittently with vomiting.  The last time she had vomiting  was a few weeks ago.  If she overeats, then she may vomit.  She has been  nauseous since before Thanksgiving.  She was given ondansetron and  Phenergan.  Phenergan causes her to be drowsy.  In the past, proton pump  inhibitors had not worked for her chest pain or her nausea.  She denies  any weight loss.  Appetite has been okay.  She eats more at nighttime.  The nausea currently is all  the time.  Zofran helps.  She never has  watery stool.  She does have to strain at times to have a bowel  movement.  She uses ibuprofen every few weeks.  She rarely consumes ice  or milk.  She does not use aspirin or BC powders.  She consumes cheese  regularly.  Her nausea may be worse with spicy foods.  Bland foods are  okay.  She describes a pressure in the middle of her abdomen which  radiates to her bilateral upper quadrant.  Sometimes, she has pressure  and then nausea and sometimes she has nausea and then pressure.  She  only bloats around her menstrual cycle.  She has had no problems with  sedation.  One to two times a week, she does see blood in her stool.  She saw blood today with wiping.  She attributes that to hemorrhoids.  Her last menstrual period was last month.   ALLERGIES:  SULFA.   MEDICATIONS:  1. Trazodone 100 mg nightly.  2. Xanax 0.5 mg.  3. Phenergan as needed.  4. Ondansetron 8 mg twice a day as needed.  5. Multivitamin.  6. Biotin.  7. Tylenol.  8. Ibuprofen.   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  She  lives with her mother.  She is employed at [employer].   REVIEW OF SYSTEMS:  Last year she weighed 163 pounds.  Review of systems  per the subjective, otherwise, negative.   OBJECTIVE:  VITAL SIGNS:  Weight 174 pounds, height 5 feet 4 inches,  temperature 97.8, blood pressure 110/68, pulse 72, BMI 29.5  (overweight).GENERAL:  She is in no apparent distress, alert and  oriented x4.HEENT:  Atraumatic, normocephalic.  Pupils equal and  reactive to light.MOUTH:  No oral lesions.  Posterior pharynx without  erythema or exudate.NECK:  Full range of motion and no  lymphadenopathy.LUNGS:  Clear to auscultation  bilaterally.CARDIOVASCULAR:  Regular rhythm, no murmur, normal S1 and  S2.ABDOMEN:  Bowel sounds are present, soft, nondistended, mild  tenderness to palpation in the epigastrium without rebound or  guarding.EXTREMITIES:  No  cyanosis or edema.   RADIOGRAPHIC STUDIES:  HIDA scan on March 21, 2008, showed a  gallbladder ejection fraction of 48%, no evidence of acute cholecystitis  or biliary obstruction, abdominal ultrasound in December 2009, revealed  gallbladder sludge.   LABORATORY DATA:  From November 2009, white count 7.9, hemoglobin 13.7,  platelets 270, potassium 4.2, BUN 12, creatinine 0.72, total bili 0.5,  albumin 4.7, AST 13, ALT 10.   ASSESSMENT:  Kristin Stephens is a 25 year old female who complains of  persistent nausea.  The differential diagnosis includes postviral  gastroparesis, H. pylori gastritis, adrenal insufficiency, occult  diabetes with thyroid disease, and a low likelihood of celiac sprue.  She also complains of dysphagia to solids.  She also has problems with  pills.  She never has a problem with liquid.  The differential diagnosis  include peptic stricture, esophageal web, and a low likelihood of  eosinophilic esophagitis.  Thank you for allowing me to see Kristin Stephens  at consultation.  My recommendations follow.   RECOMMENDATIONS:  1. Will schedule an EGD to biopsy the esophagus, stomach, and the      duodenum.  She was scheduled for colonoscopy with MoviPrep due to      rectal bleeding.  We would check a TSH, hemoglobin A1c, and      cortisol level.  2. She has a follow up appointment to see me in 6-8 weeks with regards      to her nausea.  If the biopsy do not reveal an etiology for her      nausea, then she will have a gastric emptying study.     Kassie Mends, M.D.  Electronically Signed    SM/MEDQ  D:  03/29/2008  T:  03/30/2008  Job:  [id number]   cc:   Erle Crocker, M.D.

## 2010-08-13 NOTE — Assessment & Plan Note (Signed)
NAMEMarland Stephens  MAVERY, MILLING               CHART#:  [id number]   DATE:  03/16/2008                       DOB:  04-May-1985   REQUESTING PHYSICIAN:  Erle Crocker, MD   CHIEF COMPLAINT:  Upper abdominal pain and nausea.   HISTORY OF PRESENT ILLNESS:  The patient is a 25 year old Caucasian  female.  She tells me for about the last year and half, she has had  upper abdominal pain.  It is made worse with certain foods.  She has had  a trial of Kapidex 60 mg daily for 2-3 weeks.  She has noticed no  difference.  She tells me she had an EGD by Dr. Arlyce Dice 1 year ago and  this was normal.  She has had an abdominal ultrasound, which did show  gallbladder sludge and splenomegaly at 13.5 cm.  She tells me her nausea  is usually associated with upper abdominal pain, which radiates to the  middle of her abdomen.  It is worse with spicy foods.  It is a constant  pain.  It is worse after she eats.  She describes the pain as a  pressure.  She rates it 7/10 on pain scale.  She has continued to gain  weight.  She has gained about 40 pounds in the last 2 years and  attributes this to starting Depo.  She denies any heartburn or  indigestion.  Denies any water brash.  She rarely has dysphagia and  feels as though food gets stuck in her upper esophagus.  Her appetite  waxes and wanes.  She occasionally has constipation which alter with  loose stools.  She has noticed scant blood in her stools with wiping and  attributes this to hemorrhoids.  She does use ibuprofen on a p.r.n.  basis for migraines rarely.   Laboratory studies from February 22, 2008, show a normal CBC and normal  CMP including LFTs, a negative urine pregnancy test.  She tells me that  ondansetron seems to be the only medication that helps her nausea.   PAST SURGICAL HISTORY:  She has history of coarctation of the aorta with  VSD and ASD, surgically corrected; migraine headaches; NERD;  hypertension; IBS;  allergic rhinitis; anxiety; depression;  and cardiac  cath in 2008.   CURRENT MEDICATIONS:  Trazodone 100 mg nightly, Xanax 0.5 mg p.r.n.,  Phenergan 12.5 mg q.6 h. p.r.n., ondansetron 8 mg b.i.d. p.r.n.,  multivitamin daily, biotin daily, Tylenol p.r.n., and ibuprofen 800 mg  p.r.n. rarely.   ALLERGIES:  Sulfa.   FAMILY HISTORY:  There is no known family history of colorectal  carcinoma, liver or chronic GI problems.  Mother has hypertension and  hyperlipidemia.  Father's history is unknown.  She has 3 healthy  siblings.   SOCIAL HISTORY:  The patient is single.  She lives with her mother.  She  is employed part-time with [employer].  She is an [employer] student wanting to  study nursing.  She has a remote history of tobacco use.  She consumes  alcohol about once a month and denies any drug use.   REVIEW OF SYSTEMS:  See HPI, otherwise negative.   PHYSICAL EXAMINATION:  VITAL SIGNS:  Weight 172 pounds, height 64  inches, temperature 97.8, blood pressure 120/74, and pulse 68.  GENERAL:  She is an obese Caucasian female, who is  alert, oriented,  pleasant, and cooperative, no acute distress.  HEENT:  Sclerae clear, nonicteric.  Conjunctivae pink.  Oropharynx pink  and moist without any lesions.  NECK:  Supple without mass or thyromegaly.  CHEST:  Heart, regular rate and rhythm.  Normal S1 and S2.  LUNGS:  Clear to auscultation bilaterally.  ABDOMEN:  Positive bowel sounds x4.  No bruits auscultated.  Abdomen is  soft.  She does have mild upper abdominal tenderness and tenderness  around the umbilicus.  There is no rebound, tenderness, or guarding.  No  hepatosplenomegaly or mass.  EXTREMITIES:  Without clubbing or edema.   ASSESSMENT:  The patient is a 25 year old Caucasian female with chronic  upper abdominal pain and nausea.  Symptoms are made worse with certain  foods.  EGD last year was unremarkable.  Recent abdominal ultrasound is  benign.  I suspect she could have biliary dyskinesia to attribute to her  symptoms  especially since it is worse postprandially.  It is possible  this could be functional abdominal pain and/or gastroparesis.   PLAN:  1. HIDA scan.  2. We will follow up with HIDA scan results.  If positive, she will be      referred to Surgery.  If negative, we will discuss further with Dr.      Cira Servant whether she is going to need EGD versus gastric emptying      study.   Thank you Dr. Jen Mow for allowing me to participate in the care of the  patient.       Lorenza Burton, N.P.  Electronically Signed     Kassie Mends, M.D.  Electronically Signed    KJ/MEDQ  D:  03/17/2008  T:  03/18/2008  Job:  [id number]   cc:   Erle Crocker, M.D.

## 2010-08-13 NOTE — Assessment & Plan Note (Signed)
[facility]                         GASTROENTEROLOGY OFFICE NOTE   Kristin Stephens, Kristin Stephens                     MRN:          [id number]  DATE:02/18/2007                            DOB:          03/31/1986    PROBLEM:  Chest pain.   Kristin Stephens has returned following hospitalization where she was  evaluated for chest pain.  Several months ago, she underwent cardiac  catheterization that was normal.  Cardiac evaluation both in hospital  and in Dr. Tenny Craw' office apparently was unremarkable.  She is status post  ASD and VSD repair at several weeks of age.  She underwent upper  endoscopy that was normal.  She has been taking Protonix twice a day.  She was recently started on Reglan, at my advise, because of abdominal  bloating.  She reports no improvement in her bloating symptoms.  She has  chest pain that is described as a pressure-type of discomfort in her mid  and upper chest.  It clearly worsens with twisting and bending and  lifting.  Pain is there all the time.  She feels like food may get stuck  in her throat, though she denies dysphagia, per se.  There is no history  of pyrosis.  Her February 05, 2007, admission was prompted by  lightheadedness and tingling of the hands concurrent with chest pain and  dizziness.   MEDICATIONS:  1. Protonix twice a day.  2. Biotin twice a day.  3. Oral contraceptive.  4. Reglan.   ALLERGIES:  She is allergic to SULFA.   PHYSICAL EXAMINATION:  VITAL SIGNS:  Pulse 72, blood pressure 124/68,  weight 163.  CHEST:  Clear.  CARDIOVASCULAR:  There is a 1-2/6 early systolic murmur heard best at  the left sternal border.  She has mild tenderness to palpation over her  sternum.  She claims this does reproduce her chest discomfort.  Abdomen  is without masses, tenderness or organomegaly.   IMPRESSION:  Chest pain.  I strongly suspect this is due to chest wall  pain.  There is no evidence for cardiac pain and I do not  think  gastrointestinal pain is objective including pain from reflux.   RECOMMENDATIONS:  1. Discontinue Reglan and Protonix.  2. Trial of Clinoril 200 mg twice a day for 10 days, then as needed.  3. Xanax 0.5 mg p.o. q.8h. p.r.n.   I have instructed Kristin Stephens to follow up with Dr. Gerda Diss.     Barbette Hair. Arlyce Dice, MD,FACG  Electronically Signed    RDK/MedQ  DD: 02/18/2007  DT: 02/19/2007  Job #: [id number]   cc:   Donna Bernard, M.D.
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
## 2010-08-13 NOTE — Assessment & Plan Note (Signed)
[facility]                            CARDIOLOGY OFFICE NOTE   Kristin Stephens, Kristin Stephens                     MRN:          [id number]  DATE:11/26/2007                            DOB:          April 19, 1985    IDENTIFICATION:  Kristin Stephens is a 25 year old who I have seen in the  clinic.  She has a history of congenital heart disease (status post  ASD/VSD repair.  Also history of coarctation of the aorta).  I last saw  her back in January.  She has had a history of chest pain.  Note, she  has been told she has reflux and been treated for this with improvement.   Since seen, the patient is active.  She is a Consulting civil engineer.  She does have  episodes of chest pain, though not consistent.  Note, she is not on  anything for her GI tract.  Notes occasional tingling in her hands and  feet.   CURRENT MEDICINES:  Multivitamin and Tylenol.   PHYSICAL EXAMINATION:  GENERAL:  The patient is in no distress.  VITAL SIGNS:  Blood pressure 125/79 bilateral arms, left calf 122/82,  pulse is 74 and regular.  Orthostatics; lying 110/70, pulse 69, the  patient has little pressure in the chest; sitting, 112/73, pulse 72,  dizzy at first; standing at 0 minutes 113/74, pulse 75, little  lightheaded; 2 minutes, 119/23, pulse 76, little lightheaded; 5 minutes,  118/74, pulse 76.  The patient without symptoms.  LUNGS:  Clear.  CARDIAC:  Regular rate and rhythm.  S1 and S2.  No S3.  No significant  murmurs.  ABDOMEN:  Benign.  EXTREMITIES:  No edema.   IMPRESSION:  1. Dizziness.  The patient has had a problem in the past.  She has      probably had some mild autonomic dysfunction.  She shows no      evidence of significant issues today, though on exam.  Again, I      encouraged her to stay adequately hydrated.  2. History of congenital heart disease status post repair with      residual coarctation.  Note, it does not appear to be significant      though.  She was seen by [facility]  Cardiology and placed on      antihypertensives, which made things worse.  I would not change      this for now.  I would not place her on anything with the blood      pressures as listed and again I will follow up clinically.  Note,      her last echo was in 2008.  We will need to review it to see if she      needs a repeat.  3. Chest pain is intermittent, again maybe related to autonomic      issues.  Again, maybe gastrointestinal, which she has had.  No      followup from here unless symptoms change.   I will set to see the patient back in about 1 year's time.     Pricilla Riffle, MD,  [facility]  Electronically Signed   PVR/MedQ  DD: 11/30/2007  DT: 12/01/2007  Job #: [id number]

## 2010-08-13 NOTE — Assessment & Plan Note (Signed)
[facility]                            CARDIOLOGY OFFICE NOTE   Kristin Stephens, Kristin Stephens                     MRN:          [id number]  DATE:04/29/2007                            DOB:          08/30/1985    IDENTIFICATION:  [name] is an 25 year old woman whom I saw back in  November.  She was previously followed by Dr. Lorna Few and then by  Dr. Dorris Singh [facility].  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  She also has a  history of a coarctation of the aorta.  I do not have the records.  I  saw them from [facility] but cannot find them.   When I saw her in November, she was having significant abdominal chest  pain that was not convinced was GI.  I started her on Reglan.  She was  seen by Dr. Barnet Pall in GI.  This appointment in GI was back in  November 20.  He did not think that her pain was GI in nature but gave  her a trial of Clinoril for 2 weeks and then p.r.n.   In the interval she says it is improved.  In the only comes here and  there.  Otherwise her breathing is okay.  She is in school.   She does have a little lightheadedness but no syncope.   She does give out, but she always has, with activity of any significant  walking.   CURRENT MEDS:  Include Biotin and multivitamin.   PHYSICAL EXAM:  The patient is in no distress.  Blood pressure initially 107/63.  Orthostatics blood pressure lying  104/62, pulse 68, sitting 101/62 pulse 78, standing at 0-minutes, she  was dizzy (note lightheaded while lying and sitting).  The blood  pressure standing initially 101/61 pulse 82, at 2 minutes lightheaded  101/62 pulse 88, at 5 minutes dizzy 101/59 pulse 82.  Her lungs are clear.  Cardiac exam regular rate and rhythm, S1-S2 no S3 no murmurs.  ABDOMEN:  Benign.  EXTREMITIES:  No edema.   IMPRESSION:  1. Congenital heart disease, has residual coarctation.  I do not have      the echo  records.  I do have the CAT scan that showed the site of      the coarctation repair.  I do not have the pressure readings.  Her      blood pressure is on the lower side.  She is just a little dizzy.      Encouraged to increase her fluid and salt intake.  I would not      place her on any medicines that she had been on.  2. Chest pains intermittent.  Again treat p.r.n.   I will set to see the patient back at the end of the summer.  Need to  get the records from [facility].     Pricilla Riffle, MD, [facility]  Electronically Signed    PVR/MedQ  DD: 04/29/2007  DT: 04/30/2007  Job #: [id number]

## 2010-08-13 NOTE — Op Note (Signed)
NAME:  Kristin Stephens, Kristin Stephens              ACCOUNT NO.:  [id number]   MEDICAL RECORD NO.:  [id number]          PATIENT TYPE:  AMB   LOCATION:  DAY                           FACILITY:  [facility]   PHYSICIAN:  Kassie Mends, M.D.      DATE OF BIRTH:  March 11, 1986   DATE OF PROCEDURE:  DATE OF DISCHARGE:                               OPERATIVE REPORT   REFERRING PHYSICIAN:  Erle Crocker, MD   PROCEDURES:  1. Limited colonoscopy due to poor bowel prep.  2. Esophagogastroduodenoscopy with cold forceps biopsy of the gastric      and duodenal mucosa.   INDICATION FOR EXAM:  Ms. Kristin Stephens is a 25 year old female who presents  with rectal bleeding.  She is also complaining of abdominal pain and  nausea for a year.  Ondansetron helps, and Phenergan makes her drowsy.  She uses ibuprofen. She also was complaining of difficulty swallowing  solids and pills.   LABORATORY DATA:  All within normal limits-TSH 1.299, hemoglobin 5.2,  morning cortisol 19.1.   FINDINGS:  1. Able to adequately visualize the mucosa from the anal verge to 40      cm, but beyond that, she had semiformed stool in the lumen and in      the proximal transverse colon it became more formed.  The scope      could not be advanced beyond this point.  No masses were seen.      Polyps less than 1 cm would have been missed.  Unable to appreciate      diverticulosis, inflammatory changes, or arteriovenous      malformations.  2. Small internal hemorrhoids.  Otherwise, normal retroflexed view of      the rectum.  3. Normal esophagus.  It did not have the appearance of feline      esophagus.  No evidence of Barrett's mass, erosion, ulceration, or      stricture.  4. Mild patchy erythema in the antrum.  Biopsy was obtained via cold      forceps to evaluate for eosinophilic gastritis or H. pylori      gastritis.  5. Normal duodenal bulb.  Lymphangiectasia in the second portion of      the duodenum without erythema or ulceration.  Biopsies obtained  via      cold forceps to evaluate for celiac sprue or eosinophilic      duodenitis.   DIAGNOSES:  1. No obvious source for nausea identified.  2. Mild gastritis.  3. Mild lymphangiectasia.   RECOMMENDATIONS:  1. Will await biopsies.  If biopsy reveal no etiology for nausea, then      we will proceed with a gastric-emptying study.  2. No aspirin or anti-inflammatory drugs for 30 days.  3. She should follow a high-fiber diet.  She is given a handout on      high-fiber diet, gastritis, and hemorrhoids.  4. The patient already has a follow up appointment to see me in 6-8      weeks regarding her nausea.   MEDICATIONS:  1. Demerol 125 mg IV.  2. Versed 6 mg IV.  3. Phenergan 25 mg IV.   PROCEDURE TECHNIQUE:  Physical exam was performed.  Informed consent was  obtained from the patient after explaining the benefits, risks, and  alternatives to the procedure.  The patient was connected to the monitor  and placed in left lateral position.  Continuous oxygen was provided by  nasal cannula.  IV Medicine administered through an indwelling cannula.  After administration of sedation on anorectal exam, the patient's rectum  was intubated.  The scope was advanced under direct visualization to the  proximal transverse colon.  The procedure was limited by poor bowel  prep.  The scope was removed slowly by carefully examining the color,  texture, anatomy, and integrity of the mucosa on the way out.   After colonoscopy, the patient's esophagus was intubated with a  diagnostic gastroscope and the scope was advanced under direct  visualization to the second portion of the duodenum.  The scope was  removed slowly by carefully examining the color, texture, anatomy, and  integrity of mucosa on the way out.  The patient was recovered in  endoscopy and discharged home in satisfactory condition.   PATH:  Mild gastritis. Nl duodenum. GES 12910:normal      Kassie Mends, M.D.  Electronically  Signed     SM/MEDQ  D:  04/04/2008  T:  04/04/2008  Job:  [id number]   cc:   Erle Crocker, M.D.

## 2010-08-13 NOTE — Assessment & Plan Note (Signed)
NAME:  Kristin Stephens, Kristin Stephens               CHART#:  [id number]   DATE:  05/05/2008                       DOB:  03-Apr-1985   REFERRING PHYSICIAN:  Erle Crocker, MD   PROBLEM LIST:  1. Rectal bleeding secondary to small internal hemorrhoids.  2. Flexible sigmoidoscopy in January 2010 due to formed stool in the      colon.  3. Mild gastritis via EGD in January 2010.  Small bowel biopsies      showed no evidence of celiac sprue.  4. Mildly reduced gallbladder.  Ejection fraction at 40% in December      2009 and gallbladder sludge seen on ultrasound in December 2009.  5. 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  6. Migraine headaches.  7. Nonerosive reflux disease.  8. Hypertension.  9. Anxiety.  10.Allergic rhinitis.  11.Depression.  12.Last cardiac catheterization in 2008.  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   SUBJECTIVE:  Kristin Stephens is a 25 year old female who was initially seen  in December 2009 for upper abdominal pain and nausea.  She had a HIDA  scan which showed a mildly reduced gallbladder ejection fraction.  Due  to her persistent complaint of nausea and dysphagia to solids and pills,  she was scheduled for an EGD.  She did have labs drawn during her  evaluation in December 2009.  Her TSH was 1.299, hemoglobin A1c 5.7, and  morning cortisol level 19.1.  All her labs were within normal limits.  Her upper endoscopy showed mild gastritis and no evidence of celiac  sprue.  A gastric emptying study was performed in January 2010 and it  was normal.   She returns today feeling better since being placed on a lactose-free  diet.  She was also asked to avoid anti-inflammatory drugs.  She is  using Zofran intermittently.  She uses a Zofran more at nighttime when  she eats her dinner.  Sometimes she uses it every night, but it really  depends on what she eats.  She is currently eating 2 times a day.  She  is able to eat up to 3 times a day.   She does have exposure to chemicals  and cleaning products at work.  Her headaches have become more frequent  over the last 2 weeks.  She has had one daily for the last 2 weeks.  She  uses Tylenol.  She cannot take Imitrex because it makes her chest tight.  Mother also has Fioricet which she uses.  She denies any posterior nasal  drainage.  She does have visual changes associated with her headaches.  She has some bilateral ear discomfort.  She denies any congestion.  She  is only dizzy with migraines.  She has chronic neck and back pain.  She  complains of weakness in her legs and her arms.  She denies any  heartburn or indigestion.  She did vomit once within the last week.   ALLERGIES:  SULFA.   MEDICATIONS:  1. Trazodone 100 mg nightly.  2. Xanax 0.5 mg.  3. Phenergan as needed.  4. Zofran as needed.  5. Multivitamin.  6. Biaxin  7. Tylenol as needed.  8. Saline nasal drops.   OBJECTIVE:  VITAL SIGNS:  Weight 178 pounds (up 6 pounds since December  2009), height 5 feet 4 inches, BMI 30.6 (obese), temperature 98, blood  pressure 104/70, pulse 72.  GENERAL:  She is in no apparent distress.  Alert and oriented x4.  HEENT:  Atraumatic, normocephalic.  Pupils are equal and reactive to  light.  Mouth, no oral lesions.  Posterior pharynx without erythema or  exudate.  NECK:  Full range of motion.  No lymphadenopathy.  LUNGS:  Clear to auscultation bilaterally.  CARDIOVASCULAR:  Regular rhythm.  No murmur.  Normal S1 and S2.  ABDOMEN:  Bowel sounds are present, soft, nontender, nondistended.  No  rebound or guarding.  EXTREMITIES:  No cyanosis or edema.  NEUROLOGIC:  She has no horizontal or vertical nystagmus, her pupils  equal and reactive to light, no visual field defects, she is able to  successfully perform finger-to-nose, rapid successive movements, shin-to-  heel, and heel-to-toe tandem walking without difficulty.  She has no  gross neurologic deficits.   ASSESSMENT:  Ms.  Stephens is a 25 year old female who complains of nausea  which persists but is better.  No obvious gastrointestinal source has  been identified.  Review of systems did not reveal any significant  pathology.  However, she is complaining of headaches and nausea.  The  nausea may be generated from psychosocial stressors.  The differential  diagnosis includes a low likelihood of normal pressure hydrocephalus, or  central nervous system or vascular anomaly, or malignancy.  Thank you  for allowing me to see Kristin Stephens in consultation.  My recommendation is  followed.   RECOMMENDATIONS:  1. Will give her a refill on her Zofran.  She was given 4 mg tablets      and asked to take 1-2 p.o. every 4-6 hours as needed for nausea      with refills.  2. Will order a CT scan of her head due to her nausea and headaches.  3. Will also perform a 24-hour urine for heavy metals.  4. She has a follow up appointment to see me in 3 months.  If her CT      scan and her 24-hour urine do not reveal an etiology for her      nausea, then we will refer her to Neurology.  Her GI workup is      complete.  I discussed this with Kristin Stephens and her mother.   RADS:  CT-normal, GES-nl, 24-HR Heavy metals-nl       Kassie Mends, M.D.  Electronically Signed     SM/MEDQ  D:  05/05/2008  T:  05/06/2008  Job:  [id number]   cc:   Erle Crocker, M.D.

## 2010-08-15 ENCOUNTER — Encounter: Payer: Self-pay | Admitting: Pulmonary Disease

## 2010-08-16 NOTE — Discharge Summary (Signed)
NAMECHARLE, Kristin Stephens NO.:  [id number]   MEDICAL RECORD NO.:  [id number]          PATIENT TYPE:  INP   LOCATION:  A318                          FACILITY:  [facility]   PHYSICIAN:  Donna Bernard, M.D.DATE OF BIRTH:  June 20, 1985   DATE OF ADMISSION:  08/23/2004  DATE OF DISCHARGE:  05/31/2006LH                                 DISCHARGE SUMMARY   FINAL DIAGNOSES:  1.  Migraine headaches.  2.  Gastroenteritis.   DISPOSITION:  The patient is discharged to home.   DISCHARGE MEDICATIONS:  1.  Protonix 40 mg daily x30 days.  2.  Prednisone taper as directed.  3.  Phenergan 25 mg q.4-6h. p.r.n. nausea.   FOLLOWUP:  1.  Follow up with the headache specialist, Dr. Neale Burly, as scheduled.  2.  Follow up with [place] as needed.   HISTORY AND PHYSICAL:  Please see initial H&P as dictated.   HOSPITAL COURSE:  This patient is a 25 year old white female who was  admitted to the emergency room with acute difficulties.  She had developed  five days of progressive headaches accompanied by vomiting.  Headache was  throbbing in nature with photophobia.  The patient was known to have history  of migraines.  Her presentation was complicated, however, by severe  vomiting.  In addition, other family members had been recently admitted to  the hospital for vomiting.  White blood cell on admission was 13,000 with a  left shift.  The patient was admitted to the hospital.  She started on IV  fluids.  She as given Phenergan for nausea, morphine for pain.  Topamax was  administered 200 mg at bedtime, this in keeping with her chronic Topamax  dose.  It took rather a long time for the patient to improve.  She was in  the hospital for five days.  She continued to have severe headaches,  continued to have severe nausea.  The day prior to discharge, the patient  finally started turning the corner.  On the day of discharge she was feeling  better and sent home.  Diagnosis and disposition as noted  above.       WSL/MEDQ  D:  10/16/2004  T:  10/17/2004  Job:  [id number]

## 2010-08-16 NOTE — H&P (Signed)
NAMEALTON, TREMBLAY              ACCOUNT NO.:  [id number]   MEDICAL RECORD NO.:  [id number]          PATIENT TYPE:  INP   LOCATION:  A318                          FACILITY:  [facility]   PHYSICIAN:  Scott A. Gerda Diss, MD    DATE OF BIRTH:  11-Feb-1986   DATE OF ADMISSION:  08/23/2004  DATE OF DISCHARGE:  LH                                HISTORY & PHYSICAL   CHIEF COMPLAINT:  Headache, vomiting.   HISTORY OF PRESENT ILLNESS:  This 25 year old white female who five days ago  began with having a headache.  She has a history of severe migraines.  She  states this was a throbbing headache with mild photophobia, no double  vision, no unilateral numbness or weakness, progressed on to having nausea  the following day with multiple episodes of vomiting for the next several  days and was feeling very weak and dizzy, like she was going to pass out  whenever she stood up, very lethargic.  She states that her other family  members had gastroenteritis and even her grandmother had been recently in  the hospital for gastroenteritis.  She states she tried various measures for  her headache including taking Advil and Tylenol without success.  In  addition to this she also states that she tried laying down.  Nothing  worked.  She was unable to keep anything down today, so she went ahead and  came in to be see in the office.  She was orthostatic in regards to her  heart rate going up but her blood pressure stayed steady.   PAST MEDICAL HISTORY:  1.  Congenital heart disease that was repaired.  2.  Migraine headaches.   She is up to date on all immunizations.   VICODIN causes nausea.   She is currently on:  1.  Topamax 200 mg q.h.s.  2.  Ibuprofen p.r.n.  3.  Relpax p.r.n.   REVIEW OF SYSTEMS:  Negative.   PHYSICAL EXAMINATION:  GENERAL:  NAD.  HEENT:  Optic disks appear sharp.  Pupils respond to light.  TMs normal.  __________  normal.  NECK:  Supple.  CHEST:  CTA.  HEART:  Regular.  ABDOMEN:   Soft.  EXTREMITIES:  No edema.   White count 13.2, 94 segs.  Sodium 135, potassium 3.6, CO2 19.  Liver  enzymes negative.  Amylase negative.   ASSESSMENT/PLAN:  1.  Status migraine.      1.  I had spoke with Dr. Neale Burly, her neurologist.  He recommended          intravenous steroids.      2.  We will go ahead and put her on Solu-Medrol every eight hours          intravenously, plus also morphine for pain.      3.  When she leaves, she will need to be on a steroid taper over 5-6          days.  2.  Gastroenteritis.      1.  I feel this is a viral illness but because she did have slight  abdominal tenderness, I will repeat a urine.  A urine in the office          did not show any urinary tract infection.      2.  We will also repeat lab work within the next 24 hours.       SAL/MEDQ  D:  08/23/2004  T:  08/23/2004  Job:  [id number]

## 2010-08-19 ENCOUNTER — Encounter: Payer: Self-pay | Admitting: Pulmonary Disease

## 2010-08-20 ENCOUNTER — Encounter: Payer: Self-pay | Admitting: Pulmonary Disease

## 2010-08-21 ENCOUNTER — Encounter (INDEPENDENT_AMBULATORY_CARE_PROVIDER_SITE_OTHER): Payer: Medicaid Other | Admitting: Psychiatry

## 2010-08-21 DIAGNOSIS — F39 Unspecified mood [affective] disorder: Secondary | ICD-10-CM

## 2010-08-22 ENCOUNTER — Encounter (INDEPENDENT_AMBULATORY_CARE_PROVIDER_SITE_OTHER): Payer: Medicaid Other | Admitting: Psychiatry

## 2010-08-22 DIAGNOSIS — F3189 Other bipolar disorder: Secondary | ICD-10-CM

## 2010-08-23 ENCOUNTER — Ambulatory Visit: Payer: Medicaid Other | Admitting: Pulmonary Disease

## 2010-08-28 ENCOUNTER — Ambulatory Visit (INDEPENDENT_AMBULATORY_CARE_PROVIDER_SITE_OTHER): Payer: Medicaid Other | Admitting: Pulmonary Disease

## 2010-08-28 ENCOUNTER — Encounter: Payer: Self-pay | Admitting: Pulmonary Disease

## 2010-08-28 VITALS — BP 118/70 | HR 73 | Temp 97.4°F | Ht 65.0 in | Wt 205.6 lb

## 2010-08-28 DIAGNOSIS — J45909 Unspecified asthma, uncomplicated: Secondary | ICD-10-CM

## 2010-08-28 MED ORDER — BUDESONIDE-FORMOTEROL FUMARATE 160-4.5 MCG/ACT IN AERO: 2.0000 | INHALATION_SPRAY | Freq: Two times a day (BID) | RESPIRATORY_TRACT | Status: DC

## 2010-08-28 NOTE — Assessment & Plan Note (Signed)
The pt has asthma on the basis of her methacholine challenge testing, and has seen significant improvement since being on symbicort.  She still has dyspnea with heavier exertional activities, but suspect this is related to her weight and conditioning issues.  She has no sob at rest, and this is much improved from prior.  I have had a long discussion with her about asthma, airway inflammation, and how she will need to stay on medication everyday for this.  We may be able to get her to an ICS alone in the future if she continues to improve.

## 2010-08-28 NOTE — Patient Instructions (Addendum)
Stay on symbicort as you are doing, keep mouth rinsed well Please call if you are having to use your rescue inhaler more than twice a week, or if you feel your symptoms are not under control. followup with me in 3mos

## 2010-08-28 NOTE — Progress Notes (Signed)
  Subjective:    Patient ID: Kristin Stephens, female    DOB: Jul 26, 1985, 25 y.o.   MRN: [id number]  HPI The pt comes in today for f/u of her known asthma.  She had totally normal spirometry, but had a very impressive methacholine challenge test.  She has been started on symbicort, and has seen a significant improvement in her breathing.  She denies any cough or issues with the inhaler.  She still has doe at times, but overall it is significantly improved.  I think her obesity and deconditioning are still issues for her.  She rarely has had to use her rescue inhaler.    Review of Systems  Constitutional: Negative for fever and unexpected weight change.  HENT: Positive for ear pain, congestion and sneezing. Negative for nosebleeds, sore throat, rhinorrhea, trouble swallowing, dental problem, postnasal drip and sinus pressure.   Eyes: Negative for redness and itching.  Respiratory: Positive for cough, choking and shortness of breath. Negative for chest tightness and wheezing.   Cardiovascular: Positive for palpitations and leg swelling.  Gastrointestinal: Positive for nausea. Negative for vomiting.  Genitourinary: Negative for dysuria.  Musculoskeletal: Negative for joint swelling.  Skin: Positive for rash.  Neurological: Positive for headaches.  Hematological: Bruises/bleeds easily.  Psychiatric/Behavioral: Positive for dysphoric mood. The patient is nervous/anxious.        Objective:   Physical Exam Obese female in nad Nares patent without discharge, no purulence Chest totally clear to auscultation Cor with rrr LE without edema, no cyanosis  Alert, oriented, moves all 4         Assessment & Plan:

## 2010-09-03 ENCOUNTER — Ambulatory Visit: Payer: Medicaid Other | Admitting: Pulmonary Disease

## 2010-09-05 ENCOUNTER — Encounter (HOSPITAL_BASED_OUTPATIENT_CLINIC_OR_DEPARTMENT_OTHER): Payer: Medicaid Other

## 2010-09-12 ENCOUNTER — Encounter (INDEPENDENT_AMBULATORY_CARE_PROVIDER_SITE_OTHER): Payer: Medicaid Other | Admitting: Psychiatry

## 2010-09-12 DIAGNOSIS — F3189 Other bipolar disorder: Secondary | ICD-10-CM

## 2010-09-13 ENCOUNTER — Encounter (INDEPENDENT_AMBULATORY_CARE_PROVIDER_SITE_OTHER): Payer: Medicaid Other | Admitting: Psychiatry

## 2010-09-13 DIAGNOSIS — F39 Unspecified mood [affective] disorder: Secondary | ICD-10-CM

## 2010-10-10 ENCOUNTER — Encounter (INDEPENDENT_AMBULATORY_CARE_PROVIDER_SITE_OTHER): Payer: Medicaid Other | Admitting: Psychiatry

## 2010-10-10 DIAGNOSIS — F3189 Other bipolar disorder: Secondary | ICD-10-CM

## 2010-11-12 ENCOUNTER — Telehealth: Payer: Self-pay | Admitting: Pulmonary Disease

## 2010-11-12 NOTE — Telephone Encounter (Signed)
I Spoke with the pt and she states that she had an asthma attack at work today and EMS was called and they gave her a breathing treatment and advised her to go to ER but she refused. She is c/o increased SOB, chest pressure, and dry cough. I advised pt she needs an appt. Pt set to see [facility] tomorrow 11-13-10 at 9:30am. Carron Curie, CMA

## 2010-11-13 ENCOUNTER — Encounter: Payer: Self-pay | Admitting: Pulmonary Disease

## 2010-11-13 ENCOUNTER — Ambulatory Visit (INDEPENDENT_AMBULATORY_CARE_PROVIDER_SITE_OTHER): Payer: Medicaid Other | Admitting: Pulmonary Disease

## 2010-11-13 VITALS — BP 122/78 | HR 85 | Temp 98.1°F | Ht 65.0 in | Wt 208.8 lb

## 2010-11-13 DIAGNOSIS — J45909 Unspecified asthma, uncomplicated: Secondary | ICD-10-CM

## 2010-11-13 MED ORDER — PREDNISONE 20 MG PO TABS: ORAL_TABLET | ORAL | Status: DC

## 2010-11-13 NOTE — Patient Instructions (Signed)
Will treat this as a possible asthma flare, but this may be due to spasticity of your vocal cords. No change in meds. Please call if you have another episode. Cancel upcoming apptm, and arrange for followup with me in 4mos.

## 2010-11-13 NOTE — Progress Notes (Signed)
  Subjective:    Patient ID: Kristin Stephens, female    DOB: [birth year], 25 y.o.   MRN: [id number]  HPI The patient comes in today for an acute sick visit.  She has known reactive airways disease by methacholine challenge, that is felt probably secondary to asthma.  She also has some history however that is suggestive of an element of upper airway dysfunction.  She's been maintained on symbicort and has been doing quite well.  She notes a recent episode at work where suddenly she began to have chest tightness, shortness of breath, but admits that she had been having hoarseness leading up to this event.  She had exercised aggressively the night before, and did not feel like her usual self the next morning.  However, she was not short of breath nor did she have any cough or congestion prior to this episode.  She ultimately had to be treated with a nebulizer treatment, and had improvement in her symptoms.  She denied any excessive reflux the day before or leading up to this episode.  She currently is much improved, but does not feel that she is back to her usual baseline.  She still feels some chest heaviness and some restriction to airflow.   Review of Systems  Constitutional: Negative for fever and unexpected weight change.  HENT: Positive for sore throat and trouble swallowing. Negative for ear pain, nosebleeds, congestion, rhinorrhea, sneezing, dental problem, postnasal drip and sinus pressure.   Eyes: Negative for redness and itching.  Respiratory: Positive for cough, chest tightness and shortness of breath. Negative for wheezing.   Cardiovascular: Negative for palpitations and leg swelling.  Gastrointestinal: Negative for nausea and vomiting.  Genitourinary: Negative for dysuria.  Musculoskeletal: Negative for joint swelling.  Skin: Negative for rash.  Neurological: Negative for headaches.  Hematological: Bruises/bleeds easily.  Psychiatric/Behavioral: Positive for dysphoric mood. The patient  is nervous/anxious.        Objective:   Physical Exam Obese female in no acute distress Nose without discharge or purulence Chest totally clear to auscultation Cardiac with regular rate and rhythm, 2/6 systolic murmur Lower extremities without edema, no cyanosis noted Alert and oriented, moves all 4 extremities.       Assessment & Plan:

## 2010-11-13 NOTE — Assessment & Plan Note (Signed)
The patient had been doing extremely well with symbicort, however recently had an episode of worsening shortness of breath that responded to a nebulizer treatment with albuterol.  It still remains unclear however whether this was truly an asthma attack, or whether this involved more of her upper airway.  She did have some hoarseness prior to this, and I wonder if she also has vocal cord dysfunction.  Since she has not returned to her usual baseline, we'll go ahead and treat her with a course of prednisone.  If she has another episode similar to this, may have to consider whether this is vocal cord dysfunction.  At that point she may benefit from speech therapy evaluation for relaxation exercises.

## 2010-12-04 ENCOUNTER — Ambulatory Visit: Payer: Medicaid Other | Admitting: Pulmonary Disease

## 2010-12-20 LAB — DIFFERENTIAL
Basophils Relative: 1
Lymphs Abs: 0.9
Monocytes Absolute: 0.5
Monocytes Relative: 8
Neutro Abs: 4.7

## 2010-12-20 LAB — URINALYSIS, ROUTINE W REFLEX MICROSCOPIC
Glucose, UA: NEGATIVE
Specific Gravity, Urine: >1.03 — ABNORMAL HIGH
pH: 5.5

## 2010-12-20 LAB — BASIC METABOLIC PANEL
Chloride: 107
GFR calc Af Amer: >60
Potassium: 3.7
Sodium: 137

## 2010-12-20 LAB — CBC
Hemoglobin: 11.3 — ABNORMAL LOW
MCHC: 35.2
MCV: 82.5
RBC: 3.88
WBC: 6.1

## 2011-01-07 ENCOUNTER — Encounter (HOSPITAL_COMMUNITY): Payer: Medicaid Other | Admitting: Psychiatry

## 2011-01-07 LAB — CBC
HCT: 38.2
Platelets: 263
RDW: 13.4
WBC: 5.4

## 2011-01-07 LAB — I-STAT 8, (EC8 V) (CONVERTED LAB)
Acid-base deficit: 1
Bicarbonate: 22
Glucose, Bld: 108 — ABNORMAL HIGH
HCT: 41
Hemoglobin: 13.9
Operator id: 272551
Potassium: 3.8
Sodium: 138
TCO2: 23

## 2011-01-07 LAB — URINALYSIS, ROUTINE W REFLEX MICROSCOPIC
Bilirubin Urine: NEGATIVE
Glucose, UA: NEGATIVE
Hgb urine dipstick: NEGATIVE
Ketones, ur: NEGATIVE
Nitrite: NEGATIVE
Specific Gravity, Urine: >1.046 — ABNORMAL HIGH
pH: 7

## 2011-01-07 LAB — LIPID PANEL
Cholesterol: 139
HDL: 33 — ABNORMAL LOW
LDL Cholesterol: 92
Total CHOL/HDL Ratio: 4.2
Triglycerides: 72

## 2011-01-07 LAB — RAPID URINE DRUG SCREEN, HOSP PERFORMED
Benzodiazepines: NOT DETECTED
Cocaine: NOT DETECTED
Opiates: POSITIVE — AB
Tetrahydrocannabinol: NOT DETECTED

## 2011-01-07 LAB — COMPREHENSIVE METABOLIC PANEL
CO2: 26
Calcium: 8.7
Creatinine, Ser: 0.71
GFR calc non Af Amer: >60
Glucose, Bld: 85
Total Protein: 6

## 2011-01-07 LAB — DIFFERENTIAL
Basophils Relative: 1
Eosinophils Absolute: 0
Eosinophils Relative: 1
Lymphs Abs: 1.3
Monocytes Relative: 9
Neutrophils Relative %: 65

## 2011-01-07 LAB — CK TOTAL AND CKMB (NOT AT ARMC)
Relative Index: 0.6
Total CK: 108

## 2011-01-07 LAB — CARDIAC PANEL(CRET KIN+CKTOT+MB+TROPI)
Relative Index: INVALID
Relative Index: INVALID
Total CK: 71
Total CK: 79
Troponin I: 0.01

## 2011-01-07 LAB — POCT CARDIAC MARKERS
CKMB, poc: <1 — ABNORMAL LOW
Myoglobin, poc: 32.6

## 2011-01-07 LAB — PREGNANCY, URINE: Preg Test, Ur: NEGATIVE

## 2011-01-07 LAB — MAGNESIUM: Magnesium: 2.1

## 2011-01-09 ENCOUNTER — Encounter (INDEPENDENT_AMBULATORY_CARE_PROVIDER_SITE_OTHER): Payer: Medicaid Other | Admitting: Psychiatry

## 2011-01-09 DIAGNOSIS — F39 Unspecified mood [affective] disorder: Secondary | ICD-10-CM

## 2011-02-27 NOTE — Telephone Encounter (Signed)
Seen by Dr.Ross on 07/04/2010.

## 2011-02-27 NOTE — Telephone Encounter (Signed)
Please close encounter

## 2011-03-06 ENCOUNTER — Ambulatory Visit (INDEPENDENT_AMBULATORY_CARE_PROVIDER_SITE_OTHER): Payer: Medicaid Other | Admitting: Psychiatry

## 2011-03-06 ENCOUNTER — Encounter (HOSPITAL_COMMUNITY): Payer: Medicaid Other | Admitting: Psychiatry

## 2011-03-06 ENCOUNTER — Encounter (HOSPITAL_COMMUNITY): Payer: Self-pay | Admitting: Psychiatry

## 2011-03-06 DIAGNOSIS — F319 Bipolar disorder, unspecified: Secondary | ICD-10-CM

## 2011-03-06 DIAGNOSIS — F3130 Bipolar disorder, current episode depressed, mild or moderate severity, unspecified: Secondary | ICD-10-CM

## 2011-03-06 DIAGNOSIS — F3189 Other bipolar disorder: Secondary | ICD-10-CM

## 2011-03-06 MED ORDER — PAROXETINE HCL 40 MG PO TABS: 40.0000 mg | ORAL_TABLET | Freq: Every day | ORAL | Status: DC

## 2011-03-06 MED ORDER — LAMOTRIGINE 100 MG PO TABS: 100.0000 mg | ORAL_TABLET | Freq: Every day | ORAL | Status: DC

## 2011-03-06 NOTE — Progress Notes (Signed)
Patient came for her followup appointment. She is more sad and upset as she lost her job 2 days ago. She admitted being more depressed anxious poor sleep and getting easily irritable. She is taking more regularly her melatonin for sleep. She has notice more isolated withdrawn with decreased energy and limited involvement in her life. However she denies any active or passive suicidal thinking. She admitted lately more crying and worried about her future. She is wondering if she can go back on her Paxil 40 mg which she was taking earlier this year. She is applying for unemployment and also looking for a new job. She had a good support system, her boyfriend his very involved and take good care of her. Patient also remembering the loss of her grandmother who died 4 years ago around this time. However patient is very close to her 12-year-old daughter and wants to live for her. She is hoping increase Paxil will alleviate her depression. She also wants to resume her therapy with therapist in this office. She denies any side effects of medication including itching or rash with Lamictal. She is not using any drugs.  Mental status examination Patient is well groomed and casually dressed. She described her mood as depressed and anxious and her affect is constricted. She was notice at times tearful but she denies any active or passive suicidal thoughts homicidal thoughts. She denies any auditory or visual hallucination. There are no psychotic symptoms present. Her attention and concentration is fair. Her thought process is logical linear and goal-directed. She's alert and oriented x3. Her insight judgment and impulse control is okay  Assessment Mood disorder NOS, rule out bipolar disorder depressed type  Plan I will increase her Paxil to 40 mg which she was taking earlier this year. I have explained the risk and benefits of medication in detail. The also talk about safety plan that in case if her symptoms start to get  worse or any time if she having any suicidal thoughts and homicidal thoughts than she need to call 911 or go to local ER. Patient agreed with the plan. I will continue Lamictal 100 mg daily. We also talked about applying for unemployment and new job. She will also resume her therapy in this office. I will see her again in 4-5 weeks

## 2011-03-07 ENCOUNTER — Ambulatory Visit: Payer: Medicaid Other | Admitting: Pulmonary Disease

## 2011-03-08 ENCOUNTER — Other Ambulatory Visit (HOSPITAL_COMMUNITY): Payer: Self-pay | Admitting: Psychiatry

## 2011-04-10 ENCOUNTER — Ambulatory Visit: Payer: Medicaid Other | Admitting: Pulmonary Disease

## 2011-04-15 ENCOUNTER — Ambulatory Visit (HOSPITAL_COMMUNITY): Payer: Medicaid Other | Admitting: Psychiatry

## 2011-05-01 ENCOUNTER — Encounter (HOSPITAL_COMMUNITY): Payer: Self-pay | Admitting: Psychiatry

## 2011-05-01 ENCOUNTER — Ambulatory Visit (INDEPENDENT_AMBULATORY_CARE_PROVIDER_SITE_OTHER): Payer: Medicaid Other | Admitting: Psychiatry

## 2011-05-01 VITALS — Wt 205.0 lb

## 2011-05-01 DIAGNOSIS — F319 Bipolar disorder, unspecified: Secondary | ICD-10-CM

## 2011-05-01 MED ORDER — LORAZEPAM 0.5 MG PO TABS: 0.5000 mg | ORAL_TABLET | ORAL | Status: DC | PRN

## 2011-05-01 MED ORDER — PAROXETINE HCL 40 MG PO TABS: 40.0000 mg | ORAL_TABLET | Freq: Every day | ORAL | Status: DC

## 2011-05-01 MED ORDER — LAMOTRIGINE 100 MG PO TABS: 100.0000 mg | ORAL_TABLET | Freq: Every day | ORAL | Status: DC

## 2011-05-01 NOTE — Progress Notes (Signed)
Patient came for her followup appointment. She is doing better since we increased the Paxil to 40 mg. She is also thinking to start school in March. 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 Overall her mood has been much improved. She denies any crying spells agitation or anger. Though she has not seen the therapist but like to schedule appointment very soon. She recently approved her to medicate. She still has some issues in the family but she is dealing better from the past. She reported no side effects of medication. She started recently her diet program and able to lose 3 pounds from the last visit. She denies any tremors shakes or sedation but the medication. She is still takes lorazepam only if needed. She is not using any drugs or drinking alcohol.  Mental status examination Patient is well groomed and casually dressed. She described her mood is good and her affect is improved from the past. Her speech is soft clear and coherent. She denies any active or passive suicidal thoughts or homicidal thoughts. Her attention and concentration is also improved from the past. She denies any auditory or visual hallucination. There are no psychotic symptoms present.  Her thought process is logical linear and goal-directed. She's alert and oriented x3. Her insight judgment and impulse control is okay  Assessment Mood disorder NOS, rule out bipolar disorder depressed type  Plan I will continue her Paxil 40 mg daily and Lamictal 100 mg daily. I will also continue lorazepam 0.5 mg extreme anxiety. I have explained the risk and benefits of medication in detail. I discuss the safety plan that in case if her symptoms start to get worse or any time if she having any suicidal thoughts and homicidal thoughts than she need to call 911 or go to local ER.Marland Kitchen She will also resume her therapy in this office. I will see her again in 8 weeks

## 2011-05-09 ENCOUNTER — Encounter (HOSPITAL_COMMUNITY): Payer: Self-pay | Admitting: Psychiatry

## 2011-05-09 ENCOUNTER — Ambulatory Visit (INDEPENDENT_AMBULATORY_CARE_PROVIDER_SITE_OTHER): Payer: Medicaid Other | Admitting: Psychiatry

## 2011-05-09 DIAGNOSIS — F319 Bipolar disorder, unspecified: Secondary | ICD-10-CM

## 2011-05-09 NOTE — Patient Instructions (Signed)
Discussed orally 

## 2011-05-12 NOTE — Progress Notes (Signed)
Patient:  Kristin Stephens   DOB: Aug 11, 1985  MR Number: [id number]  [facility]:  [address] [place],  [place], [address]  Start: Friday 05/09/2011 3:05 PM End: Friday 05/09/2011 3:55 PM  Provider/Observer:     Florencia Reasons, MSW, LCSW   Chief Complaint:      Chief Complaint  Patient presents with  . Depression  . Anxiety    Reason For Service:     The patient has a returning patient to this practice and has resumed services due to increased symptoms of anxiety and depression. Patient also has a history of mood swings.  Interventions Strategy:  Supportive therapy  Participation Level:   Active  Participation Quality:  Monopolizing      Behavioral Observation:  Casual, Alert, and Tearful.   Current Psychosocial Factors: Patient's maternal uncle, who is a father figure for patient, recently was diagnosed with cancer. Patient lost her job in December 2012. Patient reports a poor relationship with her sister  Content of Session:   Reviewing symptoms, identifying stressors, processing feelings, discussed the patient's illness and the effects  Current Status:   The patient reports improved stability in mood but continued mood swings at times along with increased anxiety and worry.  Patient Progress:   Fair. The patient reports experiencing several stressors within the past 2-3 months including her uncle recently being diagnosed with cancer, being laid off her job in December 2012, and increased thoughts about her deceased grandmother who died on Christmas Eve in 09-03-05. Patient also reports increased discord with her sister. Patient has returned to live with her mother and reports good support from mother. Patient reports she and her mother still have conflict at times. Patient also has increased thoughts about her father and she hasn't seen since she was 26 years old. She has unresolved issues regarding the lack of relationship with her father. Patient is hopeful that  she can apply and attend the nursing program in the fall. She reports continued support from her child's father.  Target Goals:   Decrease anxiety, decreased excessive worry, improve mood, decrease crying spells,and improve coping skills  Last Reviewed:   07/03/2010  Goals Addressed Today:    Decrease anxiety, decrease worry  Impression/Diagnosis:   The patient has a long-standing history of symptoms of anxiety and depression along with mood swings beginning in early adolescence. Symptoms have worsened since the birth of her daughter in January 2011. Current symptoms now include anxiety, depressed mood, ruminating thoughts, irritability, mood swings, and crying spells. Diagnoses: bipolar disorder NOS, anxiety disorder NOS  Diagnosis:  Axis I:  1. Bipolar disorder             Axis II: Deferred

## 2011-05-16 ENCOUNTER — Ambulatory Visit (INDEPENDENT_AMBULATORY_CARE_PROVIDER_SITE_OTHER): Payer: Medicaid Other | Admitting: Psychiatry

## 2011-05-16 DIAGNOSIS — F319 Bipolar disorder, unspecified: Secondary | ICD-10-CM

## 2011-05-19 NOTE — Progress Notes (Signed)
Patient:  Kristin Stephens   DOB: 02-22-1986  MR Number: [id number]  Location[facility]:  [address] [place],  [place], [address]  Start: Friday 05/16/2011 3:05 PM End: Friday 05/16/2011 3:55 PM  Provider/Observer:     Florencia Reasons, MSW, LCSW   Chief Complaint:      Chief Complaint  Patient presents with  . Depression  . Anxiety    Reason For Service:     The patient is a returning patient to this practice and has resumed services due to increased symptoms of anxiety and depression. Patient also has a history of mood swings.  Interventions Strategy:  Supportive therapy, psychoeducation, cognitive interpersonal therapy  Participation Level:   Active  Participation Quality:  Monopolizing      Behavioral Observation:  Casual, Alert, and Tearful.   Current Psychosocial Factors: Patient's maternal uncle, who is a father figure for patient, recently was diagnosed with cancer. Patient lost her job in December 2012. Patient reports ongoing stress regarding her relationship with her mother  Content of Session:   Reviewing symptoms, identifying stressors, processing feelings, working with patient and her mother to facilitate understanding of patient's illness as well as support for patient  Current Status:   The patient reports improved stability in mood but continued mood swings at times along with increased anxiety and worry.  Patient Progress:   Fair. The patient reports continued stress and expresses increased frustration regarding her relationship with her mother. Patient reports being appreciative of mother's support but disliking the way mother treats her as mother does not treat her like she is an adult.  Mother expresses concern that patient does not follow through in some areas and does not help with household tasks as mother expects. Therapist works with patient and her mother to improve communication skills including empathic listening skills. Patient shares with  therapist that she continues to have mood swings.  Patient reports she is considering taking a nursing entrance exam for [employer].  Target Goals:   Decrease anxiety, decreased excessive worry, improve mood, decrease crying spells,and improve coping skills  Last Reviewed:   07/03/2010  Goals Addressed Today:    Decrease anxiety, decrease worry  Impression/Diagnosis:   The patient has a long-standing history of symptoms of anxiety and depression along with mood swings beginning in early adolescence. Symptoms have worsened since the birth of her daughter in January 2011. Current symptoms now include anxiety, depressed mood, ruminating thoughts, irritability, mood swings, and crying spells. Diagnoses: bipolar disorder NOS, anxiety disorder NOS  Diagnosis:  Axis I:  1. Bipolar disorder             Axis II: Deferred

## 2011-05-23 ENCOUNTER — Ambulatory Visit (INDEPENDENT_AMBULATORY_CARE_PROVIDER_SITE_OTHER): Payer: Medicaid Other | Admitting: Psychiatry

## 2011-05-23 ENCOUNTER — Encounter (HOSPITAL_COMMUNITY): Payer: Self-pay | Admitting: Psychiatry

## 2011-05-23 DIAGNOSIS — F319 Bipolar disorder, unspecified: Secondary | ICD-10-CM

## 2011-05-23 DIAGNOSIS — F419 Anxiety disorder, unspecified: Secondary | ICD-10-CM

## 2011-05-23 DIAGNOSIS — F411 Generalized anxiety disorder: Secondary | ICD-10-CM

## 2011-05-23 NOTE — Progress Notes (Signed)
Patient:  Kristin Stephens   DOB: 19-Nov-1985  MR Number: [id number]  Location[facility]:  [address] [place],  [place], [address]  Start: Friday 05/23/2011 8:40 PM End: Friday 05/23/2011 9:30 PM  Provider/Observer:     Florencia Reasons, MSW, LCSW   Chief Complaint:      Chief Complaint  Patient presents with  . Depression  . Anxiety    Reason For Service:     The patient is a returning patient to this practice and has resumed services due to increased symptoms of anxiety and depression. Patient also has a history of mood swings.  Interventions Strategy:  Supportive therapy, psychoeducation, cognitive behavioral therapy  Participation Level:   Active  Participation Quality:  Monopolizing      Behavioral Observation:  Casual, Alert,   Current Psychosocial Factors:  Patient reports recent conflict with her mother.   Content of Session:   Reviewing symptoms, identifying stressors, processing feelings, identifying thought patterns and effects on patient's mood and behavior, practicing a mindfulness activity using breath awareness  Current Status:   The patient reports improved stability in mood but continued mood swings at times along with anxiety and worry.   Patient Progress:   Fair. The patient reports increased stress due to recent conflict with her mother as well as patient's concerns about her daughter. Patient admits a tendency to anticipate the worst. Therapist works with patient to begin to identify thinking errors. Patient also admits  inappropriate and unresolved guilt regarding her relationship with her daughter when patient was experiencing postpartum depression. 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 Patient also is pleased that she has improved self-care efforts regarding nutrition.  Target Goals:   Decrease anxiety, decrease excessive worry, improve mood, decrease crying spells,and  improve coping skills  Last Reviewed:   07/03/2010  Goals Addressed Today:    Decrease anxiety, decrease worry  Impression/Diagnosis:   The patient has a long-standing history of symptoms of anxiety and depression along with mood swings beginning in early adolescence. Symptoms have worsened since the birth of her daughter in January 2011. Current symptoms now include anxiety, depressed mood, ruminating thoughts, irritability, mood swings, and crying spells. Diagnoses: bipolar disorder NOS, anxiety disorder NOS  Diagnosis:  Axis I:  1. Bipolar disorder   2. Anxiety disorder             Axis II: Deferred

## 2011-05-23 NOTE — Patient Instructions (Signed)
Practice mindfulness exercise using breath awareness.  Practice diaphragmatic breathing.

## 2011-05-29 ENCOUNTER — Ambulatory Visit (INDEPENDENT_AMBULATORY_CARE_PROVIDER_SITE_OTHER): Payer: Medicaid Other | Admitting: Psychiatry

## 2011-05-29 ENCOUNTER — Encounter (HOSPITAL_COMMUNITY): Payer: Self-pay | Admitting: Psychiatry

## 2011-05-29 DIAGNOSIS — F411 Generalized anxiety disorder: Secondary | ICD-10-CM

## 2011-05-29 DIAGNOSIS — F419 Anxiety disorder, unspecified: Secondary | ICD-10-CM

## 2011-05-29 DIAGNOSIS — F319 Bipolar disorder, unspecified: Secondary | ICD-10-CM

## 2011-05-29 NOTE — Progress Notes (Signed)
Patient:  Kristin Stephens   DOB: 06-Apr-1985  MR Number: [id number]  Location[facility]:  [address] [place],  [place], [address]  Start: Thursday 05/29/2011 9:35 AM End: Thursday 05/29/2011 10:30 PM  Provider/Observer:     Florencia Reasons, MSW, LCSW   Chief Complaint:      Chief Complaint  Patient presents with  . Depression  . Anxiety    Reason For Service:     The patient is a returning patient to this practice and has resumed services due to increased symptoms of anxiety and depression. Patient also has a history of mood swings.  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  Content of Session:   Reviewing symptoms, processing feelings, identifying ways to improve self-care, identifying ways to develop schedule and balance responsibilities, identifying ways to increase my exposure and physicial activity. Therapist and patient also began to explore tracking tools to monitor her symptoms and help patient in developing healthy lifestyle.  Current Status:   The patient reports improved mood stability but continued down days on rainy days, Patient also is experiencing anxiety regarding beginning classes.  Patient Progress:   Fair. 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 She plans to attend orientation this Saturday and anticipates starting classes 06/09/2011. Patient is experiencing some anxiety about becoming adjusted to school and being able to concentrate. However, she is glad that she will be doing something productive and states feeling a sense of accomplishment being accepted into the program. Therapist and patient discuss the need for self-care and  balance. Patient reports continued strong support from her mother.  Target Goals:   Decrease anxiety, decrease excessive worry, improve mood, decrease crying spells,and improve coping skills  Last Reviewed:   07/03/2010  Goals Addressed Today:    Decrease anxiety, decrease worry, improve coping skills  Impression/Diagnosis:   The patient has a long-standing history of symptoms of anxiety and depression along with mood swings beginning in early adolescence. Symptoms have worsened since the birth of her daughter in January 2011. Current symptoms now include anxiety, depressed mood, ruminating thoughts, irritability, mood swings, and crying spells. Diagnoses: bipolar disorder NOS, anxiety disorder NOS  Diagnosis:  Axis I:  1. Bipolar disorder   2. Anxiety disorder             Axis II: Deferred

## 2011-06-26 ENCOUNTER — Ambulatory Visit (INDEPENDENT_AMBULATORY_CARE_PROVIDER_SITE_OTHER): Payer: Medicaid Other | Admitting: Psychiatry

## 2011-06-26 ENCOUNTER — Ambulatory Visit (HOSPITAL_COMMUNITY): Payer: Medicaid Other | Admitting: Psychiatry

## 2011-06-26 ENCOUNTER — Encounter (HOSPITAL_COMMUNITY): Payer: Self-pay | Admitting: Psychiatry

## 2011-06-26 ENCOUNTER — Ambulatory Visit (HOSPITAL_COMMUNITY): Payer: Self-pay | Admitting: Psychiatry

## 2011-06-26 VITALS — Wt 207.0 lb

## 2011-06-26 DIAGNOSIS — Z79899 Other long term (current) drug therapy: Secondary | ICD-10-CM

## 2011-06-26 DIAGNOSIS — F319 Bipolar disorder, unspecified: Secondary | ICD-10-CM

## 2011-06-26 MED ORDER — LAMOTRIGINE 100 MG PO TABS: 100.0000 mg | ORAL_TABLET | Freq: Every day | ORAL | Status: DC

## 2011-06-26 MED ORDER — PAROXETINE HCL 40 MG PO TABS: 40.0000 mg | ORAL_TABLET | Freq: Every day | ORAL | Status: DC

## 2011-06-26 NOTE — Progress Notes (Signed)
Chief complaint Medication management and followup.  History of present illness Patient is 26 year old Caucasian female who came for her followup appointment.  She has been compliant with her medication and reported no side effects.  She recently started school and wants to do nursing .  She is very happy about her school .  She has a good support from her mother.  She denies any agitation anger or mood swings.  She is sleeping fine .  She has some anxiety and nervousness but overall she denies any crying spells depressive thoughts agitation or severe anger .  She's not drinking or using any illegal substances.  She is seeing her therapist regularly .  She does not abuse his or ask for early refills of her Ativan.  Current psychiatric medication Lamictal 100 mg daily Paxil 40 mg daily Ativan 0.5 mg as needed for anxiety.  Medical history Patient has history of headache, GERD, allergic rhinitis, hypertension, irritable bowel syndrome and aortic Valve repaired.  She has recently started weight loss program and working with trainer.  Mental status examination Patient is well groomed and casually dressed. She is pleasant, calm and cooperative.  She described her mood good and her affect is mood congruent.  Her speech is soft clear and coherent. She denies any active or passive suicidal thoughts or homicidal thoughts. Her attention and concentration is also improved from the past. She denies any auditory or visual hallucination. There are no psychotic symptoms present.  Her thought process is logical linear and goal-directed. She's alert and oriented x3. Her insight judgment and impulse control is okay  Assessment Axis I Mood disorder NOS, R/O Bipolar disorder  Axis II deferred Axis III see medical history Axis IV mild to moderate Axis V 60-65  Plan I will continue her Paxil 40 mg daily,  Lamictal 100 mg daily and lorazepam 0.5 mg for extreme anxiety.  I will order CBC calm CMP and hemoglobin A1c  since patient has no blood work in past one year. I have explained the risk and benefits of medication in detail. I discuss the safety plan that in case if her symptoms start to get worse or any time if she having any suicidal thoughts and homicidal thoughts than she need to call 911 or go to local ER.Marland Kitchen She will also resume her therapy in this office. I will see her again in 8 weeks

## 2011-07-04 ENCOUNTER — Other Ambulatory Visit: Payer: Self-pay | Admitting: Internal Medicine

## 2011-07-14 ENCOUNTER — Ambulatory Visit: Payer: Medicaid Other | Admitting: Internal Medicine

## 2011-08-28 ENCOUNTER — Ambulatory Visit (INDEPENDENT_AMBULATORY_CARE_PROVIDER_SITE_OTHER): Payer: Self-pay | Admitting: Psychiatry

## 2011-08-28 ENCOUNTER — Encounter (HOSPITAL_COMMUNITY): Payer: Self-pay | Admitting: Psychiatry

## 2011-08-28 VITALS — Wt 210.0 lb

## 2011-08-28 DIAGNOSIS — F319 Bipolar disorder, unspecified: Secondary | ICD-10-CM

## 2011-08-28 DIAGNOSIS — F909 Attention-deficit hyperactivity disorder, unspecified type: Secondary | ICD-10-CM

## 2011-08-28 DIAGNOSIS — Z79899 Other long term (current) drug therapy: Secondary | ICD-10-CM

## 2011-08-28 LAB — COMPREHENSIVE METABOLIC PANEL
ALT: 17 U/L (ref 0–35)
CO2: 28 mEq/L (ref 19–32)
Calcium: 9.3 mg/dL (ref 8.4–10.5)
Chloride: 103 mEq/L (ref 96–112)
Sodium: 138 mEq/L (ref 135–145)
Total Protein: 6.8 g/dL (ref 6.0–8.3)

## 2011-08-28 LAB — CBC WITH DIFFERENTIAL/PLATELET
Lymphocytes Relative: 30 % (ref 12–46)
Lymphs Abs: 1.9 10*3/uL (ref 0.7–4.0)
MCV: 86.8 fL (ref 78.0–100.0)
Neutro Abs: 4 10*3/uL (ref 1.7–7.7)
Neutrophils Relative %: 63 % (ref 43–77)
Platelets: 332 10*3/uL (ref 150–400)
RBC: 4.63 MIL/uL (ref 3.87–5.11)
WBC: 6.3 10*3/uL (ref 4.0–10.5)

## 2011-08-28 LAB — HEMOGLOBIN A1C: Hgb A1c MFr Bld: 5 % (ref <5.7)

## 2011-08-28 MED ORDER — METHYLPHENIDATE HCL 5 MG PO TABS: 5.0000 mg | ORAL_TABLET | Freq: Every day | ORAL | Status: DC

## 2011-08-28 MED ORDER — PAROXETINE HCL 40 MG PO TABS: 40.0000 mg | ORAL_TABLET | Freq: Every day | ORAL | Status: DC

## 2011-08-28 MED ORDER — LAMOTRIGINE 100 MG PO TABS: 100.0000 mg | ORAL_TABLET | Freq: Every day | ORAL | Status: DC

## 2011-08-28 NOTE — Progress Notes (Signed)
Chief complaint I started school and I have difficulty in concentration.    History of present illness Patient is 26 year old Caucasian female who came for her followup appointment.  Patient is compliant with her school and recently reported difficulty in concentration and attention.  She's able to make B's and A's however she requires a lot of time to finish her job and Prozac.  In the past she has been diagnosed with ADHD and ADD and given Ritalin with good response.  She's wondering if she can try again Ritalin is the smaller dose .  Overall she is doing better in her mood and agitation.  She is sleeping better.  Her relationship is going well.  She denies any agitation anger or any crying spells.  She denies any rash or age with Lamictal.  She rarely takes lorazepam.  She's not drinking or using any illegal substance.  Patient had a good support from her mother.  Patient told she forgot to do blood work however she will do in next week's.    Current psychiatric medication Lamictal 100 mg daily Paxil 40 mg daily Ativan 0.5 mg as needed for anxiety.  Past psychiatric history Patient has been seeing in this office since April 2012 by this Clinical research associate however she has been seeing therapist in this office for more than 10 years.  Patient endorse history of mood swing, depression, anger and poor attention and concentration.  Patient also endorse history of self abusive behavior and endorse cutting herself with a razor blade.  She mentioned that time that her grandmother was dying she was in a lot of the stress and developed significant depression.  In the past she was prescribed Wellbutrin but she was admitted on medical for the she never took the Wellbutrin.  Patient has a history of suicidal attempt or any inpatient psychiatric treatment.  She had history of burst of energy with excessive shopping, cleaning and impulse he eating too much.  She also get into road rage and has been told multiple  times.  Psychosocial history Patient was raised by her mother and grandmother when her father left when she was only 26 years old.  Patient told her father was never in her life and she was very close to her grandmother.  Patient has a lot of resentment and anger towards her father.  Patient grandmother died when she was only 31 years old.  Patient has one daughter who is 39 years old.  Patient has a very good relationship with her boyfriend whose been very supportive.  Family history Patient endorse psychiatric illness once on her father side however patient do not know the details.  Alcohol and substance use history Patient endorse history of using marijuana and drugs and alcohol in her teens when she was self-medicating for her depression and anxiety.  However she claims to be sober since then.  Patient has a history of DWI, tremors or blackout.  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  She is hoping to finish her schooling 2015.  Patient has a history of attention and concentration problem which was formally tested by psychologist in this office.    Medical history Patient has history of headache, GERD, allergic rhinitis, hypertension, irritable bowel syndrome and aortic Valve repaired.    Mental status examination Patient is well groomed and casually dressed. She is pleasant, calm and cooperative.  She described her mood is tired and her affect is mood congruent.  Her speech is soft clear and coherent. She denies any active or passive suicidal thoughts or homicidal thoughts. Her attention and concentration is fair.  She denies any auditory or visual hallucination. There are no psychotic symptoms present.  Her thought process is logical linear and goal-directed. She's alert and oriented x3. Her insight judgment and impulse control is okay  Assessment Axis I Mood disorder NOS, R/O Bipolar disorder, ADD by history.  Axis II deferred Axis  III see medical history Axis IV mild to moderate Axis V 60-65  Plan I discussed the symptoms to the patient.  We will add Ritalin 5 mg to target the concentration and attention problem .  I will continue her Paxil 40 mg daily,  Lamictal 100 mg daily and lorazepam 0.5 mg for extreme anxiety.  I explain in detail the risks and benefits of medication.  I also recommend not to use drugs or any alcohol with her psychiatric medication due to potential interaction.  Patient is not ringing or using any illegal substance at this time.  I also encouraged her to have her blood work before the next visit.  I reordered her her CBC, CMP and hemoglobin A 1C.  I recommend to call us if you've any question or concern about the medication.  I will see her again in 4 weeks.  Time spent 30 minutes.

## 2011-09-23 ENCOUNTER — Ambulatory Visit (HOSPITAL_COMMUNITY): Payer: Self-pay | Admitting: Psychiatry

## 2011-10-03 ENCOUNTER — Other Ambulatory Visit (HOSPITAL_COMMUNITY): Payer: Self-pay | Admitting: *Deleted

## 2011-10-03 DIAGNOSIS — F319 Bipolar disorder, unspecified: Secondary | ICD-10-CM

## 2011-10-05 ENCOUNTER — Other Ambulatory Visit (HOSPITAL_COMMUNITY): Payer: Self-pay | Admitting: Psychiatry

## 2011-10-05 MED ORDER — LAMOTRIGINE 100 MG PO TABS: 100.0000 mg | ORAL_TABLET | Freq: Every day | ORAL | Status: DC

## 2011-10-09 ENCOUNTER — Encounter: Payer: Self-pay | Admitting: Internal Medicine

## 2011-10-22 ENCOUNTER — Other Ambulatory Visit: Payer: Self-pay | Admitting: Family Medicine

## 2011-10-22 DIAGNOSIS — R109 Unspecified abdominal pain: Secondary | ICD-10-CM

## 2011-10-22 LAB — BASIC METABOLIC PANEL
Alkaline Phosphatase: 81 U/L
Bilirubin, Direct: 0.1 mg/dL (ref 0.01–0.4)
Bilirubin, Indirect: 0.2
CO2: 28 mmol/L
Calcium: 9.6 mg/dL
Creat: 0.63
Lipase: 19 units/L (ref 0–53)
Sodium: 139 mmol/L (ref 137–147)

## 2011-10-27 ENCOUNTER — Ambulatory Visit (HOSPITAL_COMMUNITY)
Admission: RE | Admit: 2011-10-27 | Discharge: 2011-10-27 | Disposition: A | Discharge status: Home / Self Care | Payer: Medicaid Other | Source: Ambulatory Visit | Attending: Family Medicine | Admitting: Family Medicine

## 2011-10-27 DIAGNOSIS — R109 Unspecified abdominal pain: Secondary | ICD-10-CM | POA: Insufficient documentation

## 2011-10-27 DIAGNOSIS — K769 Liver disease, unspecified: Secondary | ICD-10-CM | POA: Insufficient documentation

## 2011-10-30 ENCOUNTER — Other Ambulatory Visit: Payer: Self-pay | Admitting: Family Medicine

## 2011-10-30 DIAGNOSIS — K769 Liver disease, unspecified: Secondary | ICD-10-CM

## 2011-10-30 DIAGNOSIS — R109 Unspecified abdominal pain: Secondary | ICD-10-CM

## 2011-11-03 ENCOUNTER — Encounter (HOSPITAL_COMMUNITY)
Admission: RE | Admit: 2011-11-03 | Discharge: 2011-11-03 | Disposition: A | Discharge status: Home / Self Care | Payer: Medicaid Other | Source: Ambulatory Visit | Attending: Family Medicine | Admitting: Family Medicine

## 2011-11-03 ENCOUNTER — Encounter (HOSPITAL_COMMUNITY): Payer: Self-pay

## 2011-11-03 ENCOUNTER — Ambulatory Visit (HOSPITAL_COMMUNITY)
Admission: RE | Admit: 2011-11-03 | Discharge: 2011-11-03 | Disposition: A | Discharge status: Home / Self Care | Payer: Medicaid Other | Source: Ambulatory Visit | Attending: Family Medicine | Admitting: Family Medicine

## 2011-11-03 DIAGNOSIS — R109 Unspecified abdominal pain: Secondary | ICD-10-CM | POA: Insufficient documentation

## 2011-11-03 DIAGNOSIS — K769 Liver disease, unspecified: Secondary | ICD-10-CM

## 2011-11-03 MED ORDER — GADOXETATE DISODIUM 0.25 MMOL/ML IV SOLN
10.0000 mL | Freq: Once | INTRAVENOUS | Status: AC | PRN
  Administered 2011-11-03: 10 mL via INTRAVENOUS

## 2011-11-03 MED ORDER — AMPHOTERICIN B 50 MG IJ SOLR
INTRAMUSCULAR | Status: AC
  Filled 2011-11-03: qty 50

## 2011-11-03 MED ORDER — TECHNETIUM TC 99M MEBROFENIN IV KIT
5.0000 | PACK | Freq: Once | INTRAVENOUS | Status: AC | PRN
  Administered 2011-11-03: 4.9 via INTRAVENOUS

## 2011-11-10 ENCOUNTER — Encounter (HOSPITAL_COMMUNITY): Payer: Self-pay | Admitting: *Deleted

## 2011-11-10 ENCOUNTER — Emergency Department (HOSPITAL_COMMUNITY): Payer: Medicaid Other

## 2011-11-10 ENCOUNTER — Emergency Department (HOSPITAL_COMMUNITY)
Admission: EM | Admit: 2011-11-10 | Discharge: 2011-11-11 | Disposition: A | Discharge status: Home / Self Care | Payer: Medicaid Other | Attending: Emergency Medicine | Admitting: Emergency Medicine

## 2011-11-10 DIAGNOSIS — K219 Gastro-esophageal reflux disease without esophagitis: Secondary | ICD-10-CM | POA: Insufficient documentation

## 2011-11-10 DIAGNOSIS — R1013 Epigastric pain: Secondary | ICD-10-CM | POA: Insufficient documentation

## 2011-11-10 DIAGNOSIS — I10 Essential (primary) hypertension: Secondary | ICD-10-CM | POA: Insufficient documentation

## 2011-11-10 DIAGNOSIS — K589 Irritable bowel syndrome without diarrhea: Secondary | ICD-10-CM | POA: Insufficient documentation

## 2011-11-10 DIAGNOSIS — R141 Gas pain: Secondary | ICD-10-CM | POA: Insufficient documentation

## 2011-11-10 DIAGNOSIS — Z87891 Personal history of nicotine dependence: Secondary | ICD-10-CM | POA: Insufficient documentation

## 2011-11-10 DIAGNOSIS — R142 Eructation: Secondary | ICD-10-CM | POA: Insufficient documentation

## 2011-11-10 MED ORDER — SODIUM CHLORIDE 0.9 % IV SOLN: INTRAVENOUS | Status: DC

## 2011-11-10 MED ORDER — ONDANSETRON HCL 4 MG/2ML IJ SOLN
4.0000 mg | Freq: Once | INTRAMUSCULAR | Status: AC
  Administered 2011-11-11: 4 mg via INTRAVENOUS
  Filled 2011-11-10: qty 2

## 2011-11-10 MED ORDER — LORAZEPAM 2 MG/ML IJ SOLN
1.0000 mg | Freq: Once | INTRAMUSCULAR | Status: AC
  Administered 2011-11-11: 1 mg via INTRAVENOUS
  Filled 2011-11-10: qty 1

## 2011-11-10 MED ORDER — PANTOPRAZOLE SODIUM 40 MG IV SOLR
40.0000 mg | Freq: Once | INTRAVENOUS | Status: AC
  Administered 2011-11-11: 40 mg via INTRAVENOUS
  Filled 2011-11-10: qty 40

## 2011-11-10 MED ORDER — SODIUM CHLORIDE 0.9 % IV BOLUS (SEPSIS)
250.0000 mL | Freq: Once | INTRAVENOUS | Status: AC
  Administered 2011-11-11: 250 mL via INTRAVENOUS

## 2011-11-10 MED ORDER — HYDROMORPHONE HCL PF 1 MG/ML IJ SOLN
1.0000 mg | Freq: Once | INTRAMUSCULAR | Status: AC
  Administered 2011-11-11: 1 mg via INTRAVENOUS
  Filled 2011-11-10: qty 1

## 2011-11-10 NOTE — ED Notes (Signed)
Constant belching, for 7 hours, Longest this has happened in past was 2 hours. And stopped on its own.  Alert.

## 2011-11-10 NOTE — ED Provider Notes (Addendum)
History  This chart was scribed for Shelda Jakes, MD by Erskine Emery. This patient was seen in room APA18/APA18 and the patient's care was started at 22:25.   CSN: [id number]  Arrival date & time 11/10/11  2042   First MD Initiated Contact with Patient 11/10/11 2225      Chief Complaint  Patient presents with  . Abdominal Pain    constant belching    (Consider location/radiation/quality/duration/timing/severity/associated sxs/prior treatment) HPI Kristin Stephens is a 26 y.o. female who presents to the Emergency Department complaining of belching for over 8 hours with an associated pressure-like abdomdinal pain, chest pain, and emesis in the mouth. Pt has tried Tums, Maalox, and Pepcid AC today with no relief from symptoms. Pt also reports she is clammy and sweaty and has loose stools, but denies any diarrhea, dysuria, or fevers. Pt has had one prior episode of simlar symptoms, but it only lasted 2 hours. Pt had an episode of abdominal pain 5 years ago and was prescribed medication that she now takes x2/day but she still has no official diagnosis. Pt's LNMP was 2 weeks ago. Pt has been seeing Dr. Jonette Eva for her GI issues and has an appointment to see her soon.   Dr. Lubertha South is the pt's PCP.   Past Medical History  Diagnosis Date  . Esophageal reflux   . Migraine, unspecified, without mention of intractable migraine without mention of status migrainosus   . Hypertension   . IBS (irritable bowel syndrome)   . Allergic rhinitis   . Anxiety   . Depression   . Precancerous changes of the cervix   . Asthma     Past Surgical History  Procedure Date  . Post asd/vsd repair ,coarc repair 1989   . Cath cardiac 2008   . Cesarean section     Family History  Problem Relation Age of Onset  . Hyperlipidemia Mother   . Hypertension Mother   . Emphysema Paternal Grandmother   . Heart disease Paternal Grandmother   . Heart disease Maternal Grandmother   . Stroke  Maternal Grandfather   . Prostate cancer Maternal Grandfather   . Bipolar disorder Paternal Grandfather     History  Substance Use Topics  . Smoking status: Former Smoker -- 0.3 packs/day for 6 years    Quit date: 05/30/2010  . Smokeless tobacco: Former Neurosurgeon    Quit date: 05/30/2010   Comment: started at age 43.   Marland Kitchen Alcohol Use: No    OB History    Grav Para Term Preterm Abortions TAB SAB Ect Mult Living                  Review of Systems  Constitutional: Negative for fever and chills.  HENT: Positive for trouble swallowing. Negative for neck pain.   Eyes: Negative for photophobia.  Respiratory: Negative for shortness of breath.   Cardiovascular: Positive for chest pain.  Gastrointestinal: Positive for vomiting and abdominal pain. Negative for diarrhea.  Genitourinary: Negative for dysuria.  Musculoskeletal: Negative for back pain.  Skin: Negative for rash.  Neurological: Negative for weakness.  Psychiatric/Behavioral: Negative for hallucinations.  All other systems reviewed and are negative.    Allergies  Coconut flavor and Sulfonamide derivatives  Home Medications   Current Outpatient Rx  Name Route Sig Dispense Refill  . ACETAMINOPHEN 325 MG PO TABS Oral Take 650 mg by mouth every 6 (six) hours as needed.      . ALBUTEROL SULFATE HFA 108 (  90 BASE) MCG/ACT IN AERS Inhalation Inhale 2 puffs into the lungs every 6 (six) hours as needed.    . AMPHETAMINE-DEXTROAMPHETAMINE 20 MG PO TABS Oral Take 10 mg by mouth 2 (two) times daily. *to be taken atleast 4 hours apart*    . LAMOTRIGINE 100 MG PO TABS Oral Take 1 tablet (100 mg total) by mouth daily. 30 tablet 0    Needs appointment for future refill.  Marland Kitchen LORAZEPAM 0.5 MG PO TABS Oral Take 1 tablet (0.5 mg total) by mouth as needed for anxiety. 30 tablet 1  . ONE-DAILY MULTI VITAMINS PO TABS Oral Take 1 tablet by mouth daily.      Marland Kitchen NORGESTIMATE-ETH ESTRADIOL 0.25-35 MG-MCG PO TABS Oral Take 1 tablet by mouth daily.    Marland Kitchen  PANTOPRAZOLE SODIUM 40 MG PO TBEC Oral Take 40 mg by mouth daily as needed. For acid reflux    . PAROXETINE HCL 20 MG PO TABS Oral Take 40 mg by mouth daily.    . TRIAMCINOLONE ACETONIDE 0.1 % EX OINT Topical Apply topically 2 (two) times daily as needed.     . ALBUTEROL 90 MCG/ACT IN AERS Inhalation Inhale 2 puffs into the lungs every 6 (six) hours as needed for wheezing. 17 g 12  . BUDESONIDE-FORMOTEROL FUMARATE 160-4.5 MCG/ACT IN AERO Inhalation Inhale 2 puffs into the lungs 2 (two) times daily. 1 Inhaler 6    Triage Vitals: BP 142/80  Pulse 102  Temp 97.7 F (36.5 C) (Oral)  Resp 20  Ht 5\' 5"  (1.651 m)  Wt 215 lb (97.523 kg)  BMI 35.78 kg/m2  SpO2 98%  LMP 10/30/2011  Physical Exam  Nursing note and vitals reviewed. Constitutional: She is oriented to person, place, and time. She appears well-developed and well-nourished. No distress.  HENT:  Head: Normocephalic and atraumatic.  Eyes: EOM are normal.  Neck: Neck supple. No tracheal deviation present.  Cardiovascular: Normal rate, regular rhythm and normal heart sounds.   No murmur heard. Pulmonary/Chest: Effort normal and breath sounds normal. No respiratory distress.  Abdominal: Soft. Bowel sounds are normal. There is no tenderness.  Musculoskeletal: Normal range of motion. She exhibits no edema.  Neurological: She is alert and oriented to person, place, and time.  Skin: Skin is warm and dry.  Psychiatric: She has a normal mood and affect.    ED Course  Procedures (including critical care time) DIAGNOSTIC STUDIES: Oxygen Saturation is 98% on room air, normal by my interpretation.    COORDINATION OF CARE: 23:01--I evaluated the patient and we discussed a treatment plan including pain medication to which the pt agreed. I notified the pt that she can have some water.    Labs Reviewed  CBC WITH DIFFERENTIAL  COMPREHENSIVE METABOLIC PANEL  LIPASE, BLOOD   Dg Chest Port 1 View  11/11/2011  *RADIOLOGY REPORT*  Clinical  Data: Abdominal pain, chest pain  PORTABLE CHEST - 1 VIEW  Comparison: 06/26/2010  Findings: Vascular clips project over the proximal descending thoracic aorta. Lungs clear.  Heart size and pulmonary vascularity normal.  No effusion.  Visualized bones unremarkable.  IMPRESSION: No acute disease  Original Report Authenticated By: Osa Craver, M.D.   Results for orders placed during the hospital encounter of 11/10/11  CBC WITH DIFFERENTIAL      Component Value Range   WBC 8.2  4.0 - 10.5 K/uL   RBC 4.62  3.87 - 5.11 MIL/uL   Hemoglobin 13.4  12.0 - 15.0 g/dL   HCT 40.1  36.0 - 46.0 %   MCV 86.8  78.0 - 100.0 fL   MCH 29.0  26.0 - 34.0 pg   MCHC 33.4  30.0 - 36.0 g/dL   RDW 19.1  47.8 - 29.5 %   Platelets 330  150 - 400 K/uL   Neutrophils Relative 70  43 - 77 %   Neutro Abs 5.8  1.7 - 7.7 K/uL   Lymphocytes Relative 23  12 - 46 %   Lymphs Abs 1.9  0.7 - 4.0 K/uL   Monocytes Relative 7  3 - 12 %   Monocytes Absolute 0.5  0.1 - 1.0 K/uL   Eosinophils Relative 0  0 - 5 %   Eosinophils Absolute 0.0  0.0 - 0.7 K/uL   Basophils Relative 0  0 - 1 %   Basophils Absolute 0.0  0.0 - 0.1 K/uL  COMPREHENSIVE METABOLIC PANEL      Component Value Range   Sodium 139  135 - 145 mEq/L   Potassium 3.5  3.5 - 5.1 mEq/L   Chloride 103  96 - 112 mEq/L   CO2 27  19 - 32 mEq/L   Glucose, Bld 87  70 - 99 mg/dL   BUN 10  6 - 23 mg/dL   Creatinine, Ser 6.21  0.50 - 1.10 mg/dL   Calcium 9.9  8.4 - 30.8 mg/dL   Total Protein 7.8  6.0 - 8.3 g/dL   Albumin 4.0  3.5 - 5.2 g/dL   AST 14  0 - 37 U/L   ALT 13  0 - 35 U/L   Alkaline Phosphatase 86  39 - 117 U/L   Total Bilirubin 0.4  0.3 - 1.2 mg/dL   GFR calc non Af Amer >90  >90 mL/min   GFR calc Af Amer >90  >90 mL/min  LIPASE, BLOOD      Component Value Range   Lipase 29  11 - 59 U/L     1. Belching   2. Abdominal pain, epigastric       MDM  Patient with history of epigastric abdominal pain with extensive workup by Dr. Kennis Carina  gallbladders toes been including the hiatus scan. When patient gets epigastric pain that time she gets persistent belching belching has never lasted longer than 2 hours tonight is been going on for 7 hours. To control the belching with the Protonix Ativan and the lauded on first round was not successful even though patient's epigastric abdominal pain is improved. It is associated with a reflux type component because she does get some sour taste in her mouth. Her liver function test are normal lipase is normal no leukocytosis abdomen was soft no acute surgical abdomen. Will try of further Ativan and hydromorphone.  Patient has also been referred to GI medicine Dr. Darrick Penna for this problem.     I personally performed the services described in this documentation, which was scribed in my presence. The recorded information has been reviewed and considered.      Shelda Jakes, MD 11/11/11 [phone]   Addendum: For Plan B will try IV Reglan 10 mg and simethicone chewable tablets to see if we can help control the perpetual belching.  Shelda Jakes, MD 11/11/11 [phone]

## 2011-11-11 LAB — COMPREHENSIVE METABOLIC PANEL
ALT: 13 U/L (ref 0–35)
AST: 14 U/L (ref 0–37)
Albumin: 4 g/dL (ref 3.5–5.2)
CO2: 27 mEq/L (ref 19–32)
Calcium: 9.9 mg/dL (ref 8.4–10.5)
Chloride: 103 mEq/L (ref 96–112)
GFR calc non Af Amer: >90 mL/min (ref >90)
Sodium: 139 mEq/L (ref 135–145)
Total Bilirubin: 0.4 mg/dL (ref 0.3–1.2)

## 2011-11-11 LAB — CBC WITH DIFFERENTIAL/PLATELET
Basophils Absolute: 0 10*3/uL (ref 0.0–0.1)
Basophils Relative: 0 % (ref 0–1)
Lymphocytes Relative: 23 % (ref 12–46)
MCHC: 33.4 g/dL (ref 30.0–36.0)
Neutro Abs: 5.8 10*3/uL (ref 1.7–7.7)
Platelets: 330 10*3/uL (ref 150–400)
RDW: 13.3 % (ref 11.5–15.5)
WBC: 8.2 10*3/uL (ref 4.0–10.5)

## 2011-11-11 MED ORDER — METOCLOPRAMIDE HCL 5 MG/ML IJ SOLN
10.0000 mg | Freq: Once | INTRAMUSCULAR | Status: AC
  Administered 2011-11-11: 10 mg via INTRAVENOUS
  Filled 2011-11-11: qty 2

## 2011-11-11 MED ORDER — SIMETHICONE 80 MG PO CHEW
80.0000 mg | CHEWABLE_TABLET | Freq: Once | ORAL | Status: AC
  Administered 2011-11-11: 80 mg via ORAL
  Filled 2011-11-11: qty 1

## 2011-11-11 NOTE — ED Provider Notes (Signed)
1 Assumed care/disposition of patient here with continuous belching. Has just been given simethicone and reglan. 1610 Patient states that belching has resolved.  Ready for discharge home.  Nicoletta Dress. Colon Branch, MD 11/11/11 [id number]

## 2011-11-11 NOTE — ED Notes (Signed)
Amount of "belching" and volume related to same has decreased slightly.  Medicated as ordered.  Lights dimmed and patient encouraged to relax.

## 2011-11-11 NOTE — ED Notes (Signed)
Almost no more belching - states she feels much better.  Calling for transport home

## 2011-11-17 ENCOUNTER — Encounter: Payer: Self-pay | Admitting: Gastroenterology

## 2011-11-17 ENCOUNTER — Ambulatory Visit (INDEPENDENT_AMBULATORY_CARE_PROVIDER_SITE_OTHER): Payer: 59 | Admitting: Psychiatry

## 2011-11-17 DIAGNOSIS — F319 Bipolar disorder, unspecified: Secondary | ICD-10-CM

## 2011-11-17 NOTE — Patient Instructions (Signed)
Discussed orally 

## 2011-11-17 NOTE — Progress Notes (Addendum)
Patient:  Kristin Stephens   DOB: [birth year]  MR Number: [id number]  [facility]  [address] [place],  [place], [address]  Start: Monday 11/17/2011 10:00 AM End: Monday 11/17/2011 10:55 AM  Provider/Observer:     Florencia Reasons, MSW, LCSW   Chief Complaint:      Chief Complaint  Patient presents with  . Anxiety    Reason For Service:     The patient is a returning patient to this practice and has resumed services now that she has insurance. Patient reports increased symptoms of anxiety and depression. She has a history of bipolar disorder.  Interventions Strategy:  Supportive therapy, cognitive therapy  Participation Level:   Active  Participation Quality:  Appropriate    Behavioral Observation:  Casual, Alert, Anxious  Current Psychosocial Factors:  Patient reports stress related to doing poorly in one of her classes, balancing parenting responsibilities with attending school, and patient's two and  half-year-old daughter recently taking some of patient's medication. Patient also is experiencing increased gastrointestinal issues but does not yet have a diagnosis.  Content of Session:   Reviewing symptoms, processing feelings, reframing negative thoughts  Current Status:   The patient reports anxiety, excessive worry, sleep difficulty, fatigue, and irritability.  Patient Progress:   Fair. The patient reports experiencing increased stress and anxiety. She is pleased with attending school but expresses frustration regarding doing poorly in one of her classes. However, she has accepted that she may have to repeat this particular class. Patient struggles with organization and balancing her responsibilities. She expresses guilt about sometimes being too tired to play with her daughter. She is pleased that they have a good relationship and admits that she puts too much pressure on herself. She continues to have strong support from her mother. Patient also reports an  incident occurred 2 weeks ago when her to a half-year-old daughter took one of patient's Adderall pills. Per patient's report, she took her daughter to the emergency room at [facility]. Her child was transferred to [facility] hospital where she eventually suffered respiratory issues related to being given Ativan in the ER. Patient reports being very frightened by the incident. She also expresses embarrassment. Therapist works with patient to process her feelings and to reframe negative thoughts. Patient also is worried about increased gastrointestinal issues. She is scheduled to see a gastroenterologist tomorrow. Patient also is scheduled to see Dr. Lolly Mustache for medication management on 11/20/2011.   Target Goals:   Decrease anxiety, decrease excessive worry  Last Reviewed:   07/03/2010  Goals Addressed Today:    Decrease anxiety, decrease worry,   Impression/Diagnosis:   The patient has a long-standing history of symptoms of anxiety and depression along with mood swings beginning in early adolescence. Symptoms have worsened since the birth of her daughter in January 2011. Current symptoms now include anxiety, depressed mood, ruminating thoughts, irritability, mood swings, and crying spells. Diagnosis: bipolar 1 disorder  Diagnosis:  Axis I:  1. Bipolar 1 disorder             Axis II: Deferred

## 2011-11-18 ENCOUNTER — Encounter: Payer: Self-pay | Admitting: Urgent Care

## 2011-11-18 ENCOUNTER — Ambulatory Visit (INDEPENDENT_AMBULATORY_CARE_PROVIDER_SITE_OTHER): Payer: Medicaid Other | Admitting: Urgent Care

## 2011-11-18 VITALS — BP 125/75 | HR 96 | Temp 98.0°F | Ht 65.0 in | Wt 220.4 lb

## 2011-11-18 DIAGNOSIS — R140 Abdominal distension (gaseous): Secondary | ICD-10-CM

## 2011-11-18 DIAGNOSIS — R141 Gas pain: Secondary | ICD-10-CM

## 2011-11-18 DIAGNOSIS — R110 Nausea: Secondary | ICD-10-CM

## 2011-11-18 MED ORDER — ALIGN PO CAPS: 1.0000 | ORAL_CAPSULE | Freq: Every day | ORAL | Status: DC

## 2011-11-18 MED ORDER — DEXLANSOPRAZOLE 60 MG PO CPDR: 60.0000 mg | DELAYED_RELEASE_CAPSULE | Freq: Every day | ORAL | Status: DC

## 2011-11-18 MED ORDER — SIMETHICONE 125 MG PO CHEW: 125.0000 mg | CHEWABLE_TABLET | Freq: Four times a day (QID) | ORAL | Status: DC | PRN

## 2011-11-18 NOTE — Progress Notes (Signed)
Primary Care Physician:  Harlow Asa, MD Primary Gastroenterologist:  Dr. Jonette Eva  Chief Complaint  Patient presents with  . Abdominal Pain  . Bloated    HPI:  Kristin Stephens is a 26 y.o. female here for evaluation of chronic abdominal bloating.  C/o chronic bloating & discomfort.  Also has "gas trapping "RUQ.  Increased belching.  Chronic nausea without vomiting.  BM 1-5 per day.  Denies rectal bleeding or melena.  No appetite.  Weight gain 48# since 4 yr ago.  Denies heartburn & indigestion.  Takes protonix 40mg  bid daily.    Gas X, TUMS, Maalox, Pepcid AC, Pepto Bismol.  No recent GYN exam.  She does a workup for chronic nausea included a negative gastric imaging study, normal fasting cortisol, normal EGD, a normal head CT. She was recently diagnosed with hepatic FNH.  10/22/11 CBC normal, met 7 and LFTs normal, lipase normal 10/30/11 MR abdomen with and without contrast-7.0 cm enhancing lesion in the central hepatic dome, corresponding to the sonographic abnormality, with imaging features characteristic of focal nodular hyperplasia (FNH). 11/03/11 HIDA scan-1. Patent cystic duct and common bile duct. 2. Normal ejection fraction equal 57% 10/27/11 abdominal ultrasound-No gallstones are noted within gallbladder. Normal CBD. 2. Heterogeneous somewhat nodular lesion in the right hepatic lobe  measures 7 x 6.3 x 6.8 cm. Further evaluation with enhanced MRI liver lesion protocol is recommended. 3. No aortic aneurysm.    Past Medical History  Diagnosis Date  . Esophageal reflux   . Migraine, unspecified, without mention of intractable migraine without mention of status migrainosus   . Hypertension   . IBS (irritable bowel syndrome)   . Allergic rhinitis   . Anxiety   . Depression   . Precancerous changes of the cervix   . Asthma   . Coarctation of aorta   . Bipolar affective disorder   . Liver lesion     7cm FNH    Past Surgical History  Procedure Date  . Post asd/vsd repair ,coarc  repair 1989   . Cath cardiac 2008   . Cesarean section 2011  . Esophagogastroduodenoscopy 04/04/2008      Normal esophagus/Mild patchy erythema in the antrum/ Normal duodenal bulb, normal small bowel biopsy  . Flexible sigmoidoscopy 04/04/2008      Fields- Small internal hemorrhoids( poor bowel prep)    Current Outpatient Prescriptions  Medication Sig Dispense Refill  . acetaminophen (TYLENOL) 325 MG tablet Take 650 mg by mouth every 6 (six) hours as needed.        Marland Kitchen albuterol (PROVENTIL HFA) 108 (90 BASE) MCG/ACT inhaler Inhale 2 puffs into the lungs every 6 (six) hours as needed.      Marland Kitchen amphetamine-dextroamphetamine (ADDERALL) 20 MG tablet Take 10 mg by mouth 2 (two) times daily. *to be taken atleast 4 hours apart*      . lamoTRIgine (LAMICTAL) 100 MG tablet Take 1 tablet (100 mg total) by mouth daily.  30 tablet  0  . LORazepam (ATIVAN) 0.5 MG tablet Take 1 tablet (0.5 mg total) by mouth as needed for anxiety.  30 tablet  1  . norgestimate-ethinyl estradiol (SPRINTEC 28) 0.25-35 MG-MCG tablet Take 1 tablet by mouth daily.      . pantoprazole (PROTONIX) 40 MG tablet Take 40 mg by mouth 2 (two) times daily. For acid reflux      . PARoxetine (PAXIL) 20 MG tablet Take 40 mg by mouth daily.      Marland Kitchen albuterol (PROVENTIL) 90 MCG/ACT inhaler  Inhale 2 puffs into the lungs every 6 (six) hours as needed for wheezing.  17 g  12  . budesonide-formoterol (SYMBICORT) 160-4.5 MCG/ACT inhaler Inhale 2 puffs into the lungs 2 (two) times daily.  1 Inhaler  6    Allergies as of 11/18/2011 - Review Complete 11/18/2011  Allergen Reaction Noted  . Coconut flavor Hives and Itching 11/10/2011  . Sulfonamide derivatives Swelling and Other (See Comments)     Family History  Problem Relation Age of Onset  . Hyperlipidemia Mother   . Hypertension Mother   . Emphysema Paternal Grandmother   . Heart disease Paternal Grandmother   . Heart disease Maternal Grandmother   . Stroke Maternal Grandfather   .  Prostate cancer Maternal Grandfather   . Bipolar disorder Paternal Grandfather     History   Social History  . Marital Status: Single    Spouse Name: N/A    Number of Children: 1  . Years of Education: N/A   Occupational History  . 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 Smoking status: Current Some Day Smoker -- 0.3 packs/day for 6 years    Last Attempt to Quit: 05/30/2010  . Smokeless tobacco: Former Neurosurgeon    Quit date: 05/30/2010   Comment: started at age 54.   Marland Kitchen Alcohol Use: Yes     rare, once per month  . Drug Use: No     marijuana and pain pills; 12/08/07-smoked x 1 month ago- this was the first time in 2.years.  Morphine Sulfate and oxycontin - none since age 25  . Sexually Active: Yes    Birth Control/ Protection: Pill   Other Topics Concern  . Not on file   Social History Narrative   Lives with mother and 2 1/2 yr old daughter.     Review of Systems: Gen: see HPI CV: Denies chest pain, angina, palpitations, syncope, orthopnea, PND, peripheral edema, and claudication. Resp: Denies dyspnea at rest, dyspnea with exercise, cough, sputum, wheezing, coughing up blood, and pleurisy. GI: Denies vomiting blood, jaundice, and fecal incontinence.   GU : Denies urinary burning, blood in urine, urinary frequency, urinary hesitancy, nocturnal urination, and urinary incontinence. MS: Denies joint pain, limitation of movement, and swelling, stiffness, low back pain, extremity pain. Denies muscle weakness, cramps, atrophy.  Derm: Denies rash, itching, dry skin, hives, moles, warts, or unhealing ulcers.  Psych: Denies depression, anxiety, memory loss, suicidal ideation, hallucinations, paranoia, and confusion. Heme: Denies bruising, bleeding, and enlarged lymph nodes. Neuro:  Denies any headaches, dizziness, paresthesias. Endo:  Denies any problems with DM, thyroid, adrenal function.  Physical Exam: BP 125/75  Pulse 96  Temp 98 F (36.7 C)  (Temporal)  Ht 5\' 5"  (1.651 m)  Wt 220 lb 6.4 oz (99.973 kg)  BMI 36.68 kg/m2  LMP 10/30/2011 Patient's last menstrual period was 10/30/2011. General:   Alert,  Well-developed, obese, pleasant and cooperative in NAD.  Accompanied by her mother. Head:  Normocephalic and atraumatic. Eyes:  Sclera clear, no icterus.   Conjunctiva pink. Ears:  Normal auditory acuity. Nose:  No deformity, discharge, or lesions. Mouth:  No deformity or lesions,oropharynx pink & moist. Neck:  Supple; no masses or thyromegaly. Lungs:  Clear throughout to auscultation.   No wheezes, crackles, or rhonchi. No acute distress. Heart:  Regular rate and rhythm; no murmurs, clicks, rubs,  or gallops. Abdomen:  Protuberant. Normal bowel sounds.  No bruits.  Soft, non-tender and non-distended without  masses, hepatosplenomegaly or hernias noted.  No guarding or rebound tenderness.   Rectal:  Deferred. Msk:  Symmetrical without gross deformities. Normal posture. Pulses:  Normal pulses noted. Extremities:  No clubbing or edema. Neurologic:  Alert and  oriented x4;  grossly normal neurologically. Skin:  Intact without significant lesions or rashes. Lymph Nodes:  No significant cervical adenopathy. Psych:  Alert and cooperative. Normal mood and affect.

## 2011-11-18 NOTE — Assessment & Plan Note (Addendum)
Kristin Stephens is a pleasant 26 y.o. female with chronic abdominal bloating and symptoms typical for irritable bowel syndrome and functional abdominal pain. No alarm features. Previous colonoscopy, EGD, small bowel biopsy negative for celiac disease. Small bowel bacterial overgrowth remains in the differential. Symptomatic treatment for IBS initially, however if she fails would consider HBT.  Urine pregnancy prior to beginning any medications Begin align daily Begin Dexilant 60 mg daily Simethicone as directed for bloating Stop Protonix Office visit in 6 weeks w/ Dr Darrick Penna

## 2011-11-18 NOTE — Patient Instructions (Addendum)
Go get your urine test at lab today before you begin any medication Begin align daily Begin Dexilant 60 mg daily Simethicone as directed for bloating Stop Protonix Office visit in 6 weeks w/ Dr Darrick Penna

## 2011-11-19 LAB — CBC WITH DIFFERENTIAL/PLATELET
HCT: 38 %
WBC: 5.7
platelet count: 342

## 2011-11-19 LAB — PREGNANCY, URINE: Preg Test, Ur: NEGATIVE

## 2011-11-19 NOTE — Progress Notes (Signed)
Faxed to PCP

## 2011-11-20 NOTE — Progress Notes (Signed)
Quick Note:  LMOM to call. ______ 

## 2011-11-20 NOTE — Progress Notes (Signed)
Quick Note:  Please let patient know urine pregnancy negative. CC: Harlow Asa, MD  ______

## 2011-11-24 ENCOUNTER — Ambulatory Visit (HOSPITAL_COMMUNITY): Payer: Self-pay | Admitting: Psychiatry

## 2011-11-25 ENCOUNTER — Encounter (HOSPITAL_COMMUNITY): Payer: Self-pay | Admitting: Psychiatry

## 2011-11-25 ENCOUNTER — Ambulatory Visit (INDEPENDENT_AMBULATORY_CARE_PROVIDER_SITE_OTHER): Payer: 59 | Admitting: Psychiatry

## 2011-11-25 ENCOUNTER — Telehealth: Payer: Self-pay | Admitting: Gastroenterology

## 2011-11-25 VITALS — BP 133/84 | HR 69 | Wt 219.0 lb

## 2011-11-25 DIAGNOSIS — F338 Other recurrent depressive disorders: Secondary | ICD-10-CM | POA: Insufficient documentation

## 2011-11-25 DIAGNOSIS — F319 Bipolar disorder, unspecified: Secondary | ICD-10-CM

## 2011-11-25 DIAGNOSIS — F39 Unspecified mood [affective] disorder: Secondary | ICD-10-CM

## 2011-11-25 MED ORDER — LAMOTRIGINE 150 MG PO TABS: 150.0000 mg | ORAL_TABLET | Freq: Every day | ORAL | Status: DC

## 2011-11-25 NOTE — Telephone Encounter (Signed)
Pt called to follow up PA and to see if she can get samples of Dexilant because she has enough for 4 more days. Please call her back at [phone]

## 2011-11-25 NOTE — Progress Notes (Signed)
Chief complaint I am started to feel more depressed and irritable.  I do not want to do anything.      History of present illness Patient is 26 year old Caucasian female who came for her followup appointment.  She was last seen in May and since then she has been getting prescription from her primary care physician as she cannot afford seeing psychiatrist.  Recently she has insurance and now like to continue her psychiatric treatment here.  On her last visit we started her on Ritalin as patient endorse ADD symptoms.  She did not felt better and her primary care physician try Adderall .  She is taking Adderall with good response.  She takes Adderall only the days when she needed.  Patient admitted recently she's been feeling more depressed sad isolated and lack of motivation.  She also endorse irritable mood and frustrated.  She endorse some stress from the school but denies any other issues.  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  She is concerned about her depression .  She endorse poor energy and crying spells.  She also gained weight in past few months and her blood pressure is also high .  She has seen recently GI Dr. and given medication .  Patient is hoping that her GI symptoms get better.  She admitted feeling nervous about her physical health and future.  She denies any mania, active or passive suicidal thoughts or homicidal thoughts.  She denies any paranoia or any hallucination.  She still takes Paxil and Lamictal.  She denies any rash itching or any side effects.    Current psychiatric medication Lamictal 100 mg daily Paxil 40 mg daily Adderall 10 mg daily however is prescribed 20 mg , she takes only when she needed  Ativan 0.5 mg as needed for anxiety.  Has not taken in recent weeks.  Past psychiatric history Patient has been seeing in this office since April 2012 by this Clinical research associate however she has been seeing therapist in this office for more than 10 years.  Patient endorse history  of mood swing, depression, anger and poor attention and concentration.  Patient also endorse history of self abusive behavior and endorse cutting herself with a razor blade when her grandmother was dying.  In the past she was prescribed Wellbutrin but she was admitted on medical for the she never took the Wellbutrin.  Patient denies any history of suicidal attempt or any inpatient psychiatric treatment.  She had history of burst of energy with excessive shopping, cleaning, road rage and impulse eating.   Psychosocial history Patient was raised by her mother and grandmother when her father left when she was only 65 years old.  Patient told her father was never in her life and she was very close to her grandmother.  Patient has a lot of resentment and anger towards her father.  Patient grandmother died when she was only 53 years old.  Patient has one daughter.  Patient has a very good relationship with her boyfriend whose been very supportive.  Family history Patient endorse psychiatric illness once on her father side however patient do not know the details.  Alcohol and substance use history Patient endorse history of using marijuana and drugs and alcohol in her teens when she was self-medicating for her depression and anxiety.  However she claims to be sober since then.  Patient has a history of DWI, tremors or blackout.  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  She is hoping to finish her schooling 2015.  Patient has a history of attention and concentration problem which was formally tested by psychologist in this office.    Medical history Patient has history of headache, GERD, allergic rhinitis, hypertension, irritable bowel syndrome and aortic Valve repaired.    Mental status examination Patient is well groomed and casually dressed. She is anxious but cooperative.  She described her mood is sad depressed and her affect is constricted.  She  denies any active or passive suicidal thoughts or homicidal thoughts.  Her speech is fast but clear and coherent.  Her thought process is logical linear and goal-directed.  There no psychotic symptoms present at this time.  Her attention and concentration this could.  She's oriented x3.  Her insight judgment and impulse control is okay.  Assessment Axis I Mood disorder NOS, R/O Bipolar disorder, ADD by history.  Axis II deferred Axis III see medical history Axis IV mild to moderate Axis V 60-65  Plan I reviewed vitals, progress note and response to medication.  Her last blood work was normal including CBC CMP.  I will increase her Lamictal to 150 mg daily.  I explained the risks and benefits especially rash in that case she need to stop Lamictal immediately.  We also talk about weight loss and closely monitoring her blood pressure and diet.  She does not take Adderall every day.  I recommend to call us if she is a question or concern about the medication she feels worsening of the symptoms.  I will see her again in 3 weeks.  Time spent 30 minutes.

## 2011-11-26 NOTE — Telephone Encounter (Signed)
#  3 boxes of Dexilant at front desk. LM for pt to pick up. We are still waiting to hear from insurance about PA

## 2011-11-27 ENCOUNTER — Ambulatory Visit (INDEPENDENT_AMBULATORY_CARE_PROVIDER_SITE_OTHER): Payer: 59 | Admitting: Psychiatry

## 2011-11-27 DIAGNOSIS — F319 Bipolar disorder, unspecified: Secondary | ICD-10-CM

## 2011-11-27 NOTE — Telephone Encounter (Signed)
agree

## 2011-11-27 NOTE — Progress Notes (Signed)
Patient:  Kristin Stephens   DOB: [birth year]  MR Number: [id number]  Location: [facility]:  [address] [place],  [place], [address]  Start: Thursday 11/27/2011 3:05 PM End: Thursday 11/27/2011 4:00 PM  Provider/Observer:     Florencia Reasons, MSW, LCSW   Chief Complaint:      Chief Complaint  Patient presents with  . Anxiety  . Depression    Reason For Service:     The patient is a returning patient to this practice and has resumed services now that she has insurance. Patient reports increased symptoms of anxiety and depression. She has a history of bipolar disorder.  Interventions Strategy:  Supportive therapy, cognitive therapy  Participation Level:   Active  Participation Quality:  Appropriate    Behavioral Observation:  Casual, Alert, Anxious  Current Psychosocial Factors:  Patient reports continued stress related to class. Patient also continues to experience gastrointestinal issues.  Content of Session:   Reviewing symptoms, processing feelings, reframing negative thoughts, identifying patient's strengths, discussing patient's mental illness and the effects on patient's life as well as ways to manage  Current Status:   The patient reports decreased irritability, increased energy, but continued anxiety and excessive worry.  Patient Progress:   Fair. The patient reports continued stress regarding academics as this is her last week of class. Patient expresses frustration and anxiety regarding her relationship with one of her instructors as she is failing then instructor's class. She recently received an e-mail from the instructor requesting patient completed a 10 page paper. Patient has decided against completing the paper as she has determined she will not have a high enough grade to pass the class even if she does extremely well on the paper. However, patient expresses some anxiety about talking with her instructor. The patient also continues to worry about her  gastrointestinal issues and weight gain. Therapist and patient explore ways to improve self-care. Patient expresses her concern about having bipolar disorder. Therapist works with patient to discuss the impact of the disorder on patient's life and areas within patient's control in terms of managing the disorder. Therapist Hospital works with patient to identify strengths. Patient reports decreased irritability and increased energy since taking increased dosage of Lamictal as prescribed by Dr. Lolly Mustache.  Target Goals:   Decrease anxiety, decrease excessive worry  Last Reviewed:   07/03/2010  Goals Addressed Today:    Decrease anxiety, decrease worry,   Impression/Diagnosis:   The patient has a long-standing history of symptoms of anxiety and depression along with mood swings beginning in early adolescence. Symptoms have worsened since the birth of her daughter in January 2011. Current symptoms now include anxiety, depressed mood, ruminating thoughts, irritability, mood swings, and crying spells. Diagnosis: bipolar 1 disorder  Diagnosis:  Axis I:  1. Bipolar disorder             Axis II: Deferred

## 2011-11-27 NOTE — Patient Instructions (Signed)
Discussed orally 

## 2011-11-28 ENCOUNTER — Encounter: Payer: Self-pay | Admitting: Internal Medicine

## 2011-11-28 ENCOUNTER — Encounter: Payer: Self-pay | Admitting: Pulmonary Disease

## 2011-11-28 ENCOUNTER — Ambulatory Visit (INDEPENDENT_AMBULATORY_CARE_PROVIDER_SITE_OTHER): Payer: Medicaid Other | Admitting: Pulmonary Disease

## 2011-11-28 ENCOUNTER — Ambulatory Visit (INDEPENDENT_AMBULATORY_CARE_PROVIDER_SITE_OTHER): Payer: Medicaid Other | Admitting: Internal Medicine

## 2011-11-28 VITALS — BP 120/72 | HR 82 | Temp 98.0°F | Ht 65.0 in | Wt 220.6 lb

## 2011-11-28 VITALS — BP 122/76 | HR 88 | Ht 65.0 in | Wt 217.0 lb

## 2011-11-28 DIAGNOSIS — J45909 Unspecified asthma, uncomplicated: Secondary | ICD-10-CM

## 2011-11-28 DIAGNOSIS — I10 Essential (primary) hypertension: Secondary | ICD-10-CM

## 2011-11-28 MED ORDER — BECLOMETHASONE DIPROPIONATE 80 MCG/ACT IN AERS: 2.0000 | INHALATION_SPRAY | Freq: Two times a day (BID) | RESPIRATORY_TRACT | Status: DC

## 2011-11-28 MED ORDER — ALBUTEROL SULFATE HFA 108 (90 BASE) MCG/ACT IN AERS: 2.0000 | INHALATION_SPRAY | Freq: Four times a day (QID) | RESPIRATORY_TRACT | Status: DC | PRN

## 2011-11-28 NOTE — Patient Instructions (Signed)
Your physician wants you to follow-up in:  12 months.  You will receive a reminder letter in the mail two months in advance. If you don't receive a letter, please call our office to schedule the follow-up appointment.   

## 2011-11-28 NOTE — Patient Instructions (Addendum)
Will start back on a daily medication for your asthma now that you have coverage.  Start qvar  2 inhalations am and pm everday whether you think you need it or not.  Keep mouth rinsed well. Use albuterol for rescue only, but call us if using more than twice a week. Stop smoking.  You have to do this! followup with me in 6mos if doing well.

## 2011-11-28 NOTE — Assessment & Plan Note (Signed)
The patient has a history of mild asthma, and I stressed to her the importance of staying on a daily medication for maintenance.  Now that she has coverage, there is no reason why she cannot stay on a maintenance inhaler.  I would like to change her over to Qvar since this is on the [facility] formulary.  I have also asked her to call me if she is overusing her albuterol.  Finally, I stressed the importance of total smoking cessation, as well as weight loss.

## 2011-11-28 NOTE — Progress Notes (Signed)
  Subjective:    Patient ID: Kristin Stephens, female    DOB: Dec 27, 1985, 26 y.o.   MRN: [id number]  HPI Patient comes in today for followup of her known asthma.  She was not able to afford her maintenance medication, and therefore discontinued.  Since being off this, she has not seen a significant worsening of her symptoms with day-to-day activity, but does see worsening shortness of breath with more moderate to heavy exertional activities.  He has not had significant cough or mucus production.  She now has Medicaid, and is able to afford her maintenance medications.   Review of Systems  Constitutional: Negative for fever and unexpected weight change.  HENT: Negative for ear pain, nosebleeds, congestion, sore throat, rhinorrhea, sneezing, trouble swallowing, dental problem, postnasal drip and sinus pressure.   Eyes: Negative for redness and itching.  Respiratory: Negative for cough, chest tightness, shortness of breath and wheezing.   Cardiovascular: Negative for palpitations and leg swelling.  Gastrointestinal: Negative for nausea and vomiting.  Genitourinary: Negative for dysuria.  Musculoskeletal: Negative for joint swelling.  Skin: Negative for rash.  Neurological: Negative for headaches.  Hematological: Does not bruise/bleed easily.  Psychiatric/Behavioral: Negative for dysphoric mood. The patient is not nervous/anxious.        Objective:   Physical Exam Obese female in no acute distress Nose without purulent or discharge noted Chest totally clear to auscultation Cardiac exam with regular rate and rhythm Lower extremities without edema, no cyanosis Alert and oriented, moves all 4 extremities.       Assessment & Plan:

## 2011-11-28 NOTE — Progress Notes (Addendum)
HPI Patient is a 26 year old with a history of ASD/VSD/Coarctation repair.  I saw hier in clinic in April 2012.  Echo prior showed no residual defects  Peak gradient across coarc of 2.3 m/sec SInce seen she has done OK from a cardiac standpoint She denies SOB  No palpitations.  No CP   She was diagnosed with bipolar disorder  She has improved with current Rx.  Allergies  Allergen Reactions  . Coconut Flavor Hives and Itching  . Sulfonamide Derivatives Swelling and Other (See Comments)    REACTION: Swelling Ophthalmic only. Patient can take orally    Current Outpatient Prescriptions  Medication Sig Dispense Refill  . acetaminophen (TYLENOL) 325 MG tablet Take 650 mg by mouth every 6 (six) hours as needed.        Marland Kitchen albuterol (PROVENTIL HFA) 108 (90 BASE) MCG/ACT inhaler Inhale 2 puffs into the lungs every 6 (six) hours as needed.      Marland Kitchen amphetamine-dextroamphetamine (ADDERALL) 20 MG tablet Take 10 mg by mouth 2 (two) times daily. *to be taken atleast 4 hours apart*      . bifidobacterium infantis (ALIGN) capsule Take 1 capsule by mouth daily.  14 capsule  0  . dexlansoprazole (DEXILANT) 60 MG capsule Take 1 capsule (60 mg total) by mouth daily.  31 capsule  2  . lamoTRIgine (LAMICTAL) 150 MG tablet Take 1 tablet (150 mg total) by mouth daily.  30 tablet  0  . LORazepam (ATIVAN) 0.5 MG tablet Take 1 tablet (0.5 mg total) by mouth as needed for anxiety.  30 tablet  1  . norgestimate-ethinyl estradiol (SPRINTEC 28) 0.25-35 MG-MCG tablet Take 1 tablet by mouth daily.      Marland Kitchen PARoxetine (PAXIL) 20 MG tablet Take 40 mg by mouth daily.      . simethicone (MYLICON) 125 MG chewable tablet Chew 1 tablet (125 mg total) by mouth every 6 (six) hours as needed for flatulence.  30 tablet  5  . albuterol (PROVENTIL) 90 MCG/ACT inhaler Inhale 2 puffs into the lungs every 6 (six) hours as needed for wheezing.  17 g  12  . budesonide-formoterol (SYMBICORT) 160-4.5 MCG/ACT inhaler Inhale 2 puffs into the lungs 2  (two) times daily.  1 Inhaler  6    Past Medical History  Diagnosis Date  . Esophageal reflux   . Migraine, unspecified, without mention of intractable migraine without mention of status migrainosus   . Hypertension   . IBS (irritable bowel syndrome)   . Allergic rhinitis   . Anxiety   . Depression   . Precancerous changes of the cervix   . Asthma   . Coarctation of aorta   . Bipolar affective disorder   . Liver lesion     7cm FNH    Past Surgical History  Procedure Date  . Post asd/vsd repair ,coarc repair 1989   . Cath cardiac 2008   . Cesarean section 2011  . Esophagogastroduodenoscopy 04/04/2008      Normal esophagus/Mild patchy erythema in the antrum/ Normal duodenal bulb, normal small bowel biopsy  . Flexible sigmoidoscopy 04/04/2008      Fields- Small internal hemorrhoids( poor bowel prep)    Family History  Problem Relation Age of Onset  . Hyperlipidemia Mother   . Hypertension Mother   . Emphysema Paternal Grandmother   . Heart disease Paternal Grandmother   . Heart disease Maternal Grandmother   . Stroke Maternal Grandfather   . Prostate cancer Maternal Grandfather   .  Bipolar disorder Paternal Grandfather     History   Social History  . Marital Status: Single    Spouse Name: N/A    Number of Children: 1  . Years of Education: N/A   Occupational History  . 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 Smoking status: Current Some Day Smoker -- 0.3 packs/day for 6 years    Last Attempt to Quit: 05/30/2010  . Smokeless tobacco: Former Neurosurgeon    Quit date: 05/30/2010   Comment: started at age 10.   Marland Kitchen Alcohol Use: Yes     rare, once per month  . Drug Use: No     marijuana and pain pills; 12/08/07-smoked x 1 month ago- this was the first time in 2.years.  Morphine Sulfate and oxycontin - none since age 63  . Sexually Active: Yes    Birth Control/ Protection: Pill   Other Topics Concern  . Not on file   Social History  Narrative   Lives with mother and 2 1/2 yr old daughter.     Review of Systems:  All systems reviewed.  They are negative to the above problem except as previously stated.  Vital Signs: BP 122/76  Pulse 88  Ht 5\' 5"  (1.651 m)  Wt 217 lb (98.431 kg)  BMI 36.11 kg/m2  LMP 10/30/2011  Physical Exam Patient is in NAD HEENT:  Normocephalic, atraumatic. EOMI, PERRLA.  Neck: JVP is normal.  No bruits.  Lungs: clear to auscultation. No rales no wheezes.  Heart: Regular rate and rhythm. Normal S1, S2. No S3.   No significant murmurs. PMI not displaced.  Abdomen:  Supple, nontender. Normal bowel sounds. No masses. No hepatomegaly.  Extremities:   Good distal pulses throughout. No lower extremity edema.  Musculoskeletal :moving all extremities.  Neuro:   alert and oriented x3.  CN II-XII grossly intact.  EKG:  SR  82 bpm. Assessment and Plan:  1.  Congenital heart disease.  Patient doing well from a cardiac standpoint.  Last echo with mild gradient in desc aorta.  Will follow periodically.  2.  HCM  Encouraged her to increase her physical activity.

## 2011-12-12 ENCOUNTER — Ambulatory Visit (HOSPITAL_COMMUNITY): Payer: Self-pay | Admitting: Psychiatry

## 2011-12-16 ENCOUNTER — Encounter (HOSPITAL_COMMUNITY): Payer: Self-pay | Admitting: Psychiatry

## 2011-12-16 ENCOUNTER — Ambulatory Visit (INDEPENDENT_AMBULATORY_CARE_PROVIDER_SITE_OTHER): Payer: 59 | Admitting: Psychiatry

## 2011-12-16 VITALS — BP 127/75 | HR 78 | Wt 219.0 lb

## 2011-12-16 DIAGNOSIS — F319 Bipolar disorder, unspecified: Secondary | ICD-10-CM

## 2011-12-16 MED ORDER — LAMOTRIGINE 150 MG PO TABS: 150.0000 mg | ORAL_TABLET | Freq: Every day | ORAL | Status: DC

## 2011-12-16 MED ORDER — PAROXETINE HCL 40 MG PO TABS: 40.0000 mg | ORAL_TABLET | Freq: Every day | ORAL | Status: DC

## 2011-12-16 NOTE — Progress Notes (Signed)
Chief complaint I am doing better on Lamictal.  I still have lack of energy and decreased motivation to do things.        History of present illness Patient is 26 year old Caucasian female who came for her followup appointment.  On her last appointment we have increase her Lamictal to target the physical depression and mood lability.  Patient has some anxiety symptoms.  She still feels some time decreased energy and stays to herself.  She's stressed about school , she is thinking to switch her school and like to restart at [employer] .  She was not happy at her current school when she was told that she cannot pasta spike having  B grades in one class.  She was told that passing should be more than 90%.  She stalk to the dean however if issue do not resolve she will consider going to community college.  She's been busy taking care of her daughter.  She noticed usually in wintertime it more depressed and isolated.  But increasing Lamictal help her mood and crying spells.  She is taking Ativan very rarely and takes Adderall only a few times in past 2 weeks.  She denies any crying spells.  She denies any agitation anger mood swing.  She denies any insomnia or any feeling of hopelessness or helplessness.  There were no rash or itching present.  She started to monitor her calorie intake and does not eat when she gets borderm.  She's not drinking or using any illegal substance.  Her GI symptoms are also improved since she start taking dexilant.    Current psychiatric medication Lamictal 150 mg daily Paxil 40 mg daily Adderall 10 mg daily however is prescribed 20 mg , she takes only when she needed prescribed by primary care physician Ativan 0.5 mg as needed for anxiety.  Has not taken in recent weeks.  Past psychiatric history Patient has been seeing in this office since April 2012 by this Clinical research associate however she has been seeing therapist in this office for more than 10 years.  Patient endorse history of mood swing,  depression, anger and poor attention and concentration.  Patient also endorse history of self abusive behavior and endorse cutting herself with a razor blade when her grandmother was dying.  In the past she was prescribed Wellbutrin but she was admitted on medical for the she never took the Wellbutrin.  Patient denies any history of suicidal attempt or any inpatient psychiatric treatment.  She had history of burst of energy with excessive shopping, cleaning, road rage and impulse eating.   Psychosocial history Patient was raised by her mother and grandmother when her father left when she was only 31 years old.  Patient told her father was never in her life and she was very close to her grandmother.  Patient has a lot of resentment and anger towards her father.  Patient grandmother died when she was only 59 years old.  Patient has one daughter.  Patient has a very good relationship with her boyfriend whose been very supportive.  Family history Patient endorse psychiatric illness once on her father side however patient do not know the details.  Alcohol and substance use history Patient endorse history of using marijuana and drugs and alcohol in her teens when she was self-medicating for her depression and anxiety.  However she claims to be sober since then.  Patient denies any history of DWI, tremors or blackout.  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  She is considering to change at a school .  She is hoping to finish her schooling 2015.  Patient has a history of attention and concentration problem which was formally tested by psychologist in this office.    Medical history Patient has history of headache, GERD, allergic rhinitis, hypertension, irritable bowel syndrome and aortic Valve repaired.    Mental status examination Patient is well groomed and casually dressed. She is calm and cooperative.  She described her mood is better and her  affect is improved from the past   She denies any active or passive suicidal thoughts or homicidal thoughts.  Her speech is fast but clear and coherent.  Her thought process is logical linear and goal-directed.  There no psychotic symptoms present at this time.  Her attention and concentration this could.  She's oriented x3.  Her insight judgment and impulse control is okay.  Assessment Axis I Mood disorder NOS, R/O Bipolar disorder, ADD by history.  Axis II deferred Axis III see medical history Axis IV mild to moderate Axis V 60-65  Plan I reviewed vitals, progress note and response to medication.  I will continue her current psychiatric dosage.  I also encouraged her to take Adderall more often to help her focus energy and a lack of concentration.  I also recommend to take Ativan to 0.5 mg half tablet as needed if full tablet make her sleepy.  We will consider increasing Paxil the patient continued to have depressive symptoms or get worse in the winter.  I will see her again in 2 months.  Time spent 30 minutes.

## 2011-12-19 ENCOUNTER — Ambulatory Visit (INDEPENDENT_AMBULATORY_CARE_PROVIDER_SITE_OTHER): Payer: 59 | Admitting: Psychiatry

## 2011-12-19 DIAGNOSIS — F319 Bipolar disorder, unspecified: Secondary | ICD-10-CM

## 2011-12-22 NOTE — Progress Notes (Signed)
Patient:  Kristin Stephens   DOB: 1985/10/22  MR Number: [id number]  [facility]: [facility]:  [address] [place],  [place], [address]  Start: Friday 12/19/2011 10:00 AM End: Friday 12/19/2011 10:50 AM  Provider/Observer:     Florencia Reasons, MSW, LCSW   Chief Complaint:      Chief Complaint  Patient presents with  . Depression  . Anxiety    Reason For Service:     The patient is a returning patient to this practice and has resumed services now that she has insurance. Patient reports increased symptoms of anxiety and depression. She has a history of bipolar disorder.  Interventions Strategy:  Supportive therapy, cognitive therapy  Participation Level:   Active  Participation Quality:  Appropriate    Behavioral Observation:  Casual, Alert, Anxious, tearful  Current Psychosocial Factors:  Patient reports stressful relationship with boyfriend.  Patient also continues to experience gastrointestinal issues.  Content of Session:   Reviewing symptoms, processing feelings, identifying ways to develop structure and routine, identifying ways to improve self-care  Current Status:   The patient reports decreased irritability but continued anxiety and excessive worry.  Patient Progress:   Fair. 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 She expresses frustration as she uses schools policies and procedures as confusing and misleading.Marland Kitchen However, she already has taken steps to enroll at [facility] and will meet with her advisor on 02/09/2012 in anticipation of starting the nursing program in January 2014. Patient expresses some anxiety about managing her time now that she isn't in school. She also expresses fear of becoming more depressed as the seasons change. Therapist works with patient to identify ways to develop a schedule and include activities to improve patient's self-care as well as manage her time. Therapist and  patient also discuss ways to increase light exposure. Patient reports increased worry about her relationship with her boyfriend as they do not see each other on a regular basis. Patient also expresses ambivalent feelings about the relationship as they have not accomplished what patient had anticipated at this point in their relationship.  Target Goals:   Decrease anxiety, decrease excessive worry  Last Reviewed:   07/03/2010  Goals Addressed Today:    Decrease anxiety, decrease worry,   Impression/Diagnosis:   The patient has a long-standing history of symptoms of anxiety and depression along with mood swings beginning in early adolescence. Symptoms have worsened since the birth of her daughter in January 2011. Current symptoms now include anxiety, depressed mood, ruminating thoughts, irritability, mood swings, and crying spells. Diagnosis: bipolar 1 disorder  Diagnosis:  Axis I:  1. Bipolar disorder             Axis II: Deferred

## 2011-12-22 NOTE — Patient Instructions (Signed)
Discussed orally 

## 2011-12-24 ENCOUNTER — Ambulatory Visit (HOSPITAL_COMMUNITY): Payer: Self-pay | Admitting: Psychiatry

## 2011-12-25 ENCOUNTER — Ambulatory Visit: Payer: Self-pay | Admitting: Gastroenterology

## 2011-12-26 ENCOUNTER — Ambulatory Visit (INDEPENDENT_AMBULATORY_CARE_PROVIDER_SITE_OTHER): Payer: 59 | Admitting: Psychiatry

## 2011-12-26 DIAGNOSIS — F319 Bipolar disorder, unspecified: Secondary | ICD-10-CM

## 2011-12-31 ENCOUNTER — Encounter: Payer: Self-pay | Admitting: Gastroenterology

## 2011-12-31 ENCOUNTER — Ambulatory Visit (INDEPENDENT_AMBULATORY_CARE_PROVIDER_SITE_OTHER): Payer: Medicaid Other | Admitting: Gastroenterology

## 2011-12-31 VITALS — BP 149/92 | HR 118 | Temp 98.1°F | Ht 65.0 in | Wt 224.2 lb

## 2011-12-31 DIAGNOSIS — K589 Irritable bowel syndrome without diarrhea: Secondary | ICD-10-CM

## 2011-12-31 DIAGNOSIS — K219 Gastro-esophageal reflux disease without esophagitis: Secondary | ICD-10-CM

## 2011-12-31 NOTE — Progress Notes (Signed)
Faxed to PCP

## 2011-12-31 NOTE — Progress Notes (Signed)
Subjective:    Patient ID: Kristin Stephens, female    DOB: 24-Jul-1985, 24 y.o.   MRN: [id number]  PCP: Gerda Diss  HPI INSURANCE HASN'T APPROVED DEXILANT. HAS MEDICAID. TRIED 2 OTHER MEDS. GETTING SAMPLES FROM [facility]. BLOATING IS GONE. TAKING ALIGN EVERY OTHER DAY. PAYS OUT OF POCKET. KIND OF EXPENSIVE. QUESTIONS ABOUT PPIS AND BONE LOSS. SMOKE: LESS THAN 2X/MO.  Past Medical History  Diagnosis Date  . Esophageal reflux   . Migraine, unspecified, without mention of intractable migraine without mention of status migrainosus   . Hypertension   . IBS (irritable bowel syndrome)   . Allergic rhinitis   . Anxiety   . Depression   . Precancerous changes of the cervix   . Asthma   . Coarctation of aorta   . Bipolar affective disorder   . Liver lesion     7cm FNH    Past Surgical History  Procedure Date  . Post asd/vsd repair ,coarc repair 1989   . Cath cardiac 2008   . Cesarean section 2011  . Esophagogastroduodenoscopy 04/04/2008      Normal esophagus/Mild patchy erythema in the antrum/ Normal duodenal bulb, normal small bowel biopsy  . Flexible sigmoidoscopy 04/04/2008      Jhanvi Drakeford- Small internal hemorrhoids( poor bowel prep)    Allergies  Allergen Reactions  . Coconut Flavor Hives and Itching  . Sulfonamide Derivatives Swelling and Other (See Comments)    REACTION: Swelling Ophthalmic only. Patient can take orally   Current Outpatient Prescriptions  Medication Sig Dispense Refill  . acetaminophen (TYLENOL) 325 MG tablet Take 650 mg by mouth every 6 (six) hours as needed.        Marland Kitchen albuterol (PROVENTIL HFA;VENTOLIN HFA) 108 (90 BASE) MCG/ACT inhaler Inhale 2 puffs into the lungs every 6 (six) hours as needed for wheezing.    Marland Kitchen ALPRAZolam (XANAX) 0.5 MG tablet Take 0.5 mg by mouth at bedtime as needed.    Marland Kitchen amphetamine-dextroamphetamine (ADDERALL) 20 MG tablet Take 10 mg by mouth 2 (two) times daily. *to be taken atleast 4 hours apart*    . beclomethasone (QVAR) 80 MCG/ACT inhaler  Inhale 2 puffs into the lungs 2 (two) times daily.    . bifidobacterium infantis (ALIGN) capsule Take 1 capsule by mouth daily.    . Biotin 5000 MCG TABS Take 5,000 mg by mouth daily.    Marland Kitchen dexlansoprazole (DEXILANT) 60 MG capsule Take 60 mg by mouth daily.    Marland Kitchen lamoTRIgine (LAMICTAL) 150 MG tablet Take 1 tablet (150 mg total) by mouth daily.    Marland Kitchen PARoxetine (PAXIL) 40 MG tablet Take 1 tablet (40 mg total) by mouth daily.    . simethicone (MYLICON) 125 MG chewable tablet Chew 125 mg by mouth every 6 (six) hours as needed.    .      .      . albuterol (PROVENTIL,VENTOLIN) 90 MCG/ACT inhaler Inhale 2 puffs into the lungs every 6 (six) hours as needed.    Marland Kitchen LORazepam (ATIVAN) 0.5 MG tablet Take 1 tablet (0.5 mg total) by mouth as needed for anxiety.    . norgestimate-ethinyl estradiol (SPRINTEC 28) 0.25-35 MG-MCG tablet Take 1 tablet by mouth daily.          Review of Systems     Objective:   Physical Exam  Constitutional: She is oriented to person, place, and time. She appears well-nourished. No distress.  HENT:  Head: Normocephalic and atraumatic.  Mouth/Throat: Oropharynx is clear and moist. No oropharyngeal exudate.  Eyes: Pupils are equal, round, and reactive to light. No scleral icterus.  Neck: Normal range of motion. Neck supple.  Cardiovascular: Normal rate, regular rhythm and normal heart sounds.   Pulmonary/Chest: Effort normal and breath sounds normal. No respiratory distress.  Abdominal: Soft. Bowel sounds are normal. She exhibits no distension. There is no tenderness.  Neurological: She is alert and oriented to person, place, and time.       NO FOCAL DEFICITS   Psychiatric: She has a normal mood and affect.          Assessment & Plan:

## 2011-12-31 NOTE — Patient Instructions (Signed)
CONTINUE DEXILANT.  CONTINUE A PROBIOTIC DAILY.  CONTINUE YOUR WEIGHT LOSS EFFORTS. FOLLOW A LOW FAT DIET. SEE INFO BELOW.  FOLLOW UP IN 4 MOS.   Low-Fat Diet BREADS, CEREALS, PASTA, RICE, DRIED PEAS, AND BEANS These products are high in carbohydrates and most are low in fat. Therefore, they can be increased in the diet as substitutes for fatty foods. They too, however, contain calories and should not be eaten in excess. Cereals can be eaten for snacks as well as for breakfast.   FRUITS AND VEGETABLES It is good to eat fruits and vegetables. Besides being sources of fiber, both are rich in vitamins and some minerals. They help you get the daily allowances of these nutrients. Fruits and vegetables can be used for snacks and desserts.  MEATS Limit lean meat, chicken, [place], and fish to no more than 6 ounces per day. Beef, Pork, and Lamb Use lean cuts of beef, pork, and lamb. Lean cuts include:  Extra-lean ground beef.  Arm roast.  Sirloin tip.  Center-cut ham.  Round steak.  Loin chops.  Rump roast.  Tenderloin.  Trim all fat off the outside of meats before cooking. It is not necessary to severely decrease the intake of red meat, but lean choices should be made. Lean meat is rich in protein and contains a highly absorbable form of iron. Premenopausal women, in particular, should avoid reducing lean red meat because this could increase the risk for low red blood cells (iron-deficiency anemia).  Chicken and [place] These are good sources of protein. The fat of poultry can be reduced by removing the skin and underlying fat layers before cooking. Chicken and [place] can be substituted for lean red meat in the diet. Poultry should not be fried or covered with high-fat sauces. Fish and Shellfish Fish is a good source of protein. Shellfish contain cholesterol, but they usually are low in saturated fatty acids. The preparation of fish is important. Like chicken and [place], they should not be  fried or covered with high-fat sauces. EGGS Egg whites contain no fat or cholesterol. They can be eaten often. Try 1 to 2 egg whites instead of whole eggs in recipes or use egg substitutes that do not contain yolk. MILK AND DAIRY PRODUCTS Use skim or 1% milk instead of 2% or whole milk. Decrease whole milk, natural, and processed cheeses. Use nonfat or low-fat (2%) cottage cheese or low-fat cheeses made from vegetable oils. Choose nonfat or low-fat (1 to 2%) yogurt. Experiment with evaporated skim milk in recipes that call for heavy cream. Substitute low-fat yogurt or low-fat cottage cheese for sour cream in dips and salad dressings. Have at least 2 servings of low-fat dairy products, such as 2 glasses of skim (or 1%) milk each day to help get your daily calcium intake. FATS AND OILS Reduce the total intake of fats, especially saturated fat. Butterfat, lard, and beef fats are high in saturated fat and cholesterol. These should be avoided as much as possible. Vegetable fats do not contain cholesterol, but certain vegetable fats, such as coconut oil, palm oil, and palm kernel oil are very high in saturated fats. These should be limited. These fats are often used in bakery goods, processed foods, popcorn, oils, and nondairy creamers. Vegetable shortenings and some peanut butters contain hydrogenated oils, which are also saturated fats. Read the labels on these foods and check for saturated vegetable oils. Unsaturated vegetable oils and fats do not raise blood cholesterol. However, they should be limited because they  are fats and are high in calories. Total fat should still be limited to 30% of your daily caloric intake. Desirable liquid vegetable oils are corn oil, cottonseed oil, olive oil, canola oil, safflower oil, soybean oil, and sunflower oil. Peanut oil is not as good, but small amounts are acceptable. Buy a heart-healthy tub margarine that has no partially hydrogenated oils in the ingredients. Mayonnaise  and salad dressings often are made from unsaturated fats, but they should also be limited because of their high calorie and fat content. Seeds, nuts, peanut butter, olives, and avocados are high in fat, but the fat is mainly the unsaturated type. These foods should be limited mainly to avoid excess calories and fat. OTHER EATING TIPS Snacks  Most sweets should be limited as snacks. They tend to be rich in calories and fats, and their caloric content outweighs their nutritional value. Some good choices in snacks are graham crackers, melba toast, soda crackers, bagels (no egg), English muffins, fruits, and vegetables. These snacks are preferable to snack crackers, [place] fries, TORTILLA CHIPS, and POTATO chips. Popcorn should be air-popped or cooked in small amounts of liquid vegetable oil. Desserts Eat fruit, low-fat yogurt, and fruit ices instead of pastries, cake, and cookies. Sherbet, angel food cake, gelatin dessert, frozen low-fat yogurt, or other frozen products that do not contain saturated fat (pure fruit juice bars, frozen ice pops) are also acceptable.  COOKING METHODS Choose those methods that use little or no fat. They include: Poaching.  Braising.  Steaming.  Grilling.  Baking.  Stir-frying.  Broiling.  Microwaving.  Foods can be cooked in a nonstick pan without added fat, or use a nonfat cooking spray in regular cookware. Limit fried foods and avoid frying in saturated fat. Add moisture to lean meats by using water, broth, cooking wines, and other nonfat or low-fat sauces along with the cooking methods mentioned above. Soups and stews should be chilled after cooking. The fat that forms on top after a few hours in the refrigerator should be skimmed off. When preparing meals, avoid using excess salt. Salt can contribute to raising blood pressure in some people.  EATING AWAY FROM HOME Order entres, potatoes, and vegetables without sauces or butter. When meat exceeds the size of a deck  of cards (3 to 4 ounces), the rest can be taken home for another meal. Choose vegetable or fruit salads and ask for low-calorie salad dressings to be served on the side. Use dressings sparingly. Limit high-fat toppings, such as bacon, crumbled eggs, cheese, sunflower seeds, and olives. Ask for heart-healthy tub margarine instead of butter.

## 2011-12-31 NOTE — Assessment & Plan Note (Addendum)
SX IMPROVED WITH PROBIOTIC. GAINED 52 LBS SINCE 2009.  CONTINUE DAILY (ALIGN OR [employer] BRAND). OPV IN 4 MOS.

## 2011-12-31 NOTE — Assessment & Plan Note (Signed)
FAILED PROTONIX.  CONTINUE DEXILANT. AWAITING PA. PT WILL CALL IN  2 WEEKS IF NO DECISION BY MEDICAID. LOW FAT DIET CONTINUE WEIGHT LOSS EFFORTS OPV IN 4 MOS

## 2012-01-01 NOTE — Patient Instructions (Signed)
Discussed orally 

## 2012-01-01 NOTE — Progress Notes (Signed)
Patient:  Kristin Stephens   DOB: 06-Jul-1985  MR Number: [id number]  Location: [facility]:  [address] [place],  [place], [address]  Start: Friday 12/26/2011 3:00 PM End: Friday 12/26/2011 3:50 PM  Provider/Observer:     Florencia Reasons, MSW, LCSW   Chief Complaint:      Chief Complaint  Patient presents with  . Anxiety  . Depression    Reason For Service:     The patient is a returning patient to this practice and has resumed services now that she has insurance. Patient reports increased symptoms of anxiety and depression. She has a history of bipolar disorder.  Interventions Strategy:  Supportive therapy, cognitive therapy  Participation Level:   Active  Participation Quality:  Appropriate    Behavioral Observation:  Casual, Alert, Anxious  Current Psychosocial Factors:  Patient reports improved communication with boyfriend.  Content of Session:   Reviewing symptoms, processing feelings, reviewing ways to develop structure and routine, reviewing ways to improve self-care  Current Status:   The patient reports decreased irritability, decreased anxiety, and decreased worry  Patient Progress:   Good. The patient reports decreased stress as she was assertive with her boyfriend and expressed her concerns about their relationship. Per patient's report, her boyfriend was receptive and is making increased efforts to improve his communication as well as increase the frequency he sees patient and their daughter.  Patient shares that she has been a good breath to nasal spray for 4-5 years. However she officially quit using nasal spray this week under the supervision of her primary care physician. Patient is fearful that she may  resume nasal spray use if and when she becomes congested. Therapist works with patient to identify her thought patterns and effects on her mood and behavior. Therapist also works with patient to problem solve and identify options available to patient.  Patient continues to struggle with maintaining consistency regarding self-care and organization. Therapist reviews with patient ways to develop structure and routine as well as ways to improve self-care.  Target Goals:   Decrease anxiety, decrease excessive worry  Last Reviewed:   07/03/2010  Goals Addressed Today:    Decrease anxiety, decrease worry,   Impression/Diagnosis:   The patient has a long-standing history of symptoms of anxiety and depression along with mood swings beginning in early adolescence. Symptoms have worsened since the birth of her daughter in January 2011. Current symptoms now include anxiety, depressed mood, ruminating thoughts, irritability, mood swings, and crying spells. Diagnosis: bipolar 1 disorder  Diagnosis:  Axis I:  1. Bipolar disorder             Axis II: Deferred

## 2012-01-05 ENCOUNTER — Ambulatory Visit (HOSPITAL_COMMUNITY): Payer: Self-pay | Admitting: Psychiatry

## 2012-01-05 ENCOUNTER — Ambulatory Visit (HOSPITAL_COMMUNITY)
Admission: RE | Admit: 2012-01-05 | Discharge: 2012-01-05 | Disposition: A | Discharge status: Home / Self Care | Payer: Medicaid Other | Source: Ambulatory Visit | Attending: Family Medicine | Admitting: Family Medicine

## 2012-01-05 ENCOUNTER — Other Ambulatory Visit: Payer: Self-pay | Admitting: Family Medicine

## 2012-01-05 DIAGNOSIS — M25473 Effusion, unspecified ankle: Secondary | ICD-10-CM | POA: Insufficient documentation

## 2012-01-05 DIAGNOSIS — M25579 Pain in unspecified ankle and joints of unspecified foot: Secondary | ICD-10-CM | POA: Insufficient documentation

## 2012-01-05 DIAGNOSIS — M25476 Effusion, unspecified foot: Secondary | ICD-10-CM | POA: Insufficient documentation

## 2012-01-08 NOTE — Progress Notes (Signed)
Reminder in epic to follow up in 4 months °

## 2012-01-11 NOTE — Progress Notes (Signed)
REVIEWED.  

## 2012-01-19 ENCOUNTER — Ambulatory Visit (INDEPENDENT_AMBULATORY_CARE_PROVIDER_SITE_OTHER): Payer: Medicaid Other | Admitting: Psychiatry

## 2012-01-19 DIAGNOSIS — F319 Bipolar disorder, unspecified: Secondary | ICD-10-CM

## 2012-01-21 NOTE — Progress Notes (Signed)
Patient:  Kristin Stephens   DOB: [birth year]  MR Number: [id number]  Location: [facility]:  [address] [place],  [place], [address]  Start: Monday 01/19/2012 1:00 PM End: Monday 01/19/2012 1:50 PM  Provider/Observer:     Florencia Reasons, MSW, LCSW   Chief Complaint:      Chief Complaint  Patient presents with  . Depression    Reason For Service:     The patient is a returning patient to this practice and has resumed services now that she has insurance. Patient reports increased symptoms of anxiety and depression. She has a history of bipolar disorder. Patient is seen today for follow up appointment.  Interventions Strategy:  Supportive therapy, psychoeducation ,cognitive therapy  Participation Level:   Active  Participation Quality:  Appropriate    Behavioral Observation:  Casual, Alert, less anxious  Current Psychosocial Factors:  Patient reports improved communication with boyfriend.  Content of Session:   Reviewing symptoms, processing feelings, developing treatment plan  Current Status:   The patient reports decreased irritability, decreased anxiety, and decreased worry but experiencing periods of depression since last session  Patient Progress:   Good. The patient reports doing better but continuing to experience periods of depression. She reports having an episode recently in which she felt very lonely, sad, and fear of being alone. She also reports increased negative thought patterns during that time. Therapist works with patient to discuss the symptoms of her illness and ways to intervene. Patient reports increased thoughts as well as grief and loss issues regarding her relationships with her father and her sister. Patient is pleased with improved and successful efforts regarding improving her communication skills in her relationship with her boyfriend. She reports decreased yelling and being assertive rather than aggressive. Patient continues to struggle  regarding self-care but reports being more active until recently when she tore a ligament in her foot. She continues to pursue efforts to resume attending school. She is considering going to [employer] next semester rather than attending [employer].    Target Goals:   1. Increase understanding of mental disorder and improve self-care ; 1: 1 psychoeducation and cognitive behavior therapy, one time every 1-4 weeks     2. Improve communication skills (decrease yelling) and ability to identify and verbalize feelings; 1-1 psychotherapy ( supportive and cognitive therapy) one time every 1-4 weeks    3. Decrease anxiety and improve relaxation/coping techniques; 1-1 psychotherapy (supportive and cognitive behavioral therapy) one time every 1-4 weeks    4. increase positive thought patterns and decreased negative thought patterns; 1:1 psychotherapy (supportive and cognitive therapy) one time every 1-4 weeks  Last Reviewed:   01/19/2012  Goals Addressed Today:    Goal 1  Impression/Diagnosis:   The patient has a long-standing history of symptoms of anxiety and depression along with mood swings beginning in early adolescence. Symptoms have worsened since the birth of her daughter in January 2011. Current symptoms now include anxiety, depressed mood, ruminating thoughts, irritability, mood swings, and crying spells. Diagnosis: bipolar 1 disorder  Diagnosis:  Axis I:  1. Bipolar disorder             Axis II: Deferred

## 2012-01-21 NOTE — Patient Instructions (Signed)
Discussed orally 

## 2012-01-28 ENCOUNTER — Ambulatory Visit (HOSPITAL_COMMUNITY): Payer: Self-pay | Admitting: Psychiatry

## 2012-02-05 ENCOUNTER — Ambulatory Visit (INDEPENDENT_AMBULATORY_CARE_PROVIDER_SITE_OTHER): Payer: Medicaid Other | Admitting: Psychiatry

## 2012-02-05 ENCOUNTER — Encounter (HOSPITAL_COMMUNITY): Payer: Self-pay | Admitting: Psychiatry

## 2012-02-05 VITALS — BP 124/78 | HR 72 | Ht 65.75 in | Wt 218.0 lb

## 2012-02-05 DIAGNOSIS — F988 Other specified behavioral and emotional disorders with onset usually occurring in childhood and adolescence: Secondary | ICD-10-CM

## 2012-02-05 DIAGNOSIS — F319 Bipolar disorder, unspecified: Secondary | ICD-10-CM

## 2012-02-05 MED ORDER — CITALOPRAM HYDROBROMIDE 10 MG PO TABS: ORAL_TABLET | ORAL | Status: DC

## 2012-02-05 MED ORDER — LAMOTRIGINE 150 MG PO TABS: 150.0000 mg | ORAL_TABLET | Freq: Every day | ORAL | Status: DC

## 2012-02-05 NOTE — Progress Notes (Signed)
Chief complaint I am doing okay on Lamictal, but the depression and anxiety keep coming back.   History of present illness Patient is 26 year old Caucasian female who came for her followup appointment. Discussed her history, medications, effects, and side effects, along with her responses and GI symptoms.  It seems that she could have Celiac Disease and Seasonal affective disorder.  Recc that she consult with her provider for a test to confirm the Celiac.  Also recommended that she try [employer] brand bread and use almond flour for substitiues.  Also gave her infor about [employer] planner and trying full spectrum lights.  Also discussed the addictive nature of Xanax.  She only used 5 doses of Xanax in the month of Oct.  She also had 2 episodes of depression in Oct.  Will shift from Paxil to Celexa in cross over titration.      Current psychiatric medication Lamictal 150 mg daily Paxil 40 mg daily Adderall 10 mg daily however is prescribed 20 mg , she takes only when she needed prescribed by primary care physician Xanax from nurse practitioner.  Past psychiatric history Patient has been seeing in this office since April 2012 by this Clinical research associate however she has been seeing therapist in this office for more than 10 years.  Patient endorse history of mood swing, depression, anger and poor attention and concentration.  Patient also endorse history of self abusive behavior and endorse cutting herself with a razor blade when her grandmother was dying.  In the past she was prescribed Wellbutrin but she was admitted on medical for the she never took the Wellbutrin.  Patient denies any history of suicidal attempt or any inpatient psychiatric treatment.  She had history of burst of energy with excessive shopping, cleaning, road rage and impulse eating.   Psychosocial history Patient was raised by her mother and grandmother when her father left when she was only 44 years old.  Patient told her father was never in  her life and she was very close to her grandmother.  Patient has a lot of resentment and anger towards her father.  Patient grandmother died when she was only 89 years old.  Patient has one daughter.  Patient has a very good relationship with her boyfriend whose been very supportive.  Family history Patient endorse psychiatric illness once on her father side however patient do not know the details.  Alcohol and substance use history Patient endorse history of using marijuana and drugs and alcohol in her teens when she was self-medicating for her depression and anxiety.  However she claims to be sober since then.  Patient denies any history of DWI, tremors or blackout.  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  She is considering to change at a school .  She is hoping to finish her schooling 2015.  Patient has a history of attention and concentration problem which was formally tested by psychologist in this office.    Medical history Patient has history of headache, GERD, allergic rhinitis, hypertension, irritable bowel syndrome and aortic Valve repaired.    Mental status examination Patient is well groomed and casually dressed. She is calm and cooperative.  She described her mood is better and her affect is improved from the past   She denies any active or passive suicidal thoughts or homicidal thoughts.  Her speech is fast but clear and coherent.  Her thought process is logical linear and goal-directed.  There no psychotic  symptoms present at this time.  Her attention and concentration this could.  She's oriented x3.  Her insight judgment and impulse control is okay.  Assessment Axis I Mood disorder NOS, R/O Bipolar disorder, ADD by history.  Axis II deferred Axis III see medical history Axis IV mild to moderate Axis V 60-65  Plan I reviewed vitals, history, medications, effects and side effects as well as response to medications and  general health issues.  Also asked her about her spiritual home.  She dose not have a church that helped her grow.  Spoke to her about some options.  She was open to them.  Will taper off Paxil and onto Celexa.  Suggest that she stop the Xanax and occasional cigarette.  Also try using full spectrum lights for the possible seasonal affective disorder.   Dan Humphreys, Imari Sivertsen 02/05/2012

## 2012-02-05 NOTE — Patient Instructions (Addendum)
Light therapy lights from [employer] may be very helpful for you.  Getting tests for celiac disease could also be helpful.  [employer] for [employer] by Wyn Quaker Schafe may be an interesting source for you.  Strongly consider attending at least 6 Alanon Meetings to help you learn about how your helping others to the exclusion of helping yourself is actually hurting yourself and is actually an addiction to fixing others and that you need to work the 12 Step to Happiness through the [employer]. Al-Anon Family Groups could be helpful with how to deal with substance abusing family and friends. Or your own issues of being in victim role.  There are only 40 Alanon Family Group meetings a week here in [place].  Online are current listing of those meetings @ [email or web address].[email or web address]/meetings.[email or web address]  There are [employer].  Search on line and there you can learn the format and can access the schedule for yourself.  Their number is [phone]  Break Paxil in 1/2 for a week then stop  Start Celexa as instructed and could go up to 30 or even 40 mg a day.

## 2012-02-06 ENCOUNTER — Ambulatory Visit (INDEPENDENT_AMBULATORY_CARE_PROVIDER_SITE_OTHER): Payer: 59 | Admitting: Psychiatry

## 2012-02-06 DIAGNOSIS — F319 Bipolar disorder, unspecified: Secondary | ICD-10-CM

## 2012-02-06 NOTE — Patient Instructions (Signed)
Discussed orally 

## 2012-02-06 NOTE — Progress Notes (Signed)
Patient:  Kristin Stephens   DOB: [birth year]  MR Number: [id number]  Location: [facility]:  [address] [place],  [place], [address]  Start: Friday 02/06/2012 11:00 AM End: Friday 02/06/2012 11:50 AM  Provider/Observer:     Florencia Reasons, MSW, LCSW   Chief Complaint:      Chief Complaint  Patient presents with  . Depression  . Anxiety    Reason For Service:     The patient is a returning patient to this practice and has resumed services now that she has insurance. Patient reports increased symptoms of anxiety and depression. She has a history of bipolar disorder. Patient is seen today for follow up appointment.  Interventions Strategy:  Supportive therapy, psychoeducation ,cognitive therapy  Participation Level:   Active  Participation Quality:  Appropriate    Behavioral Observation:  Casual, Alert, less anxious, improved mood  Current Psychosocial Factors:  Patient reports stress related to trying to enroll in school and her relationship with her sister.  Content of Session:   Reviewing symptoms, processing feelings, identifying counter statements to challenge negative thoughts, reinforcing patient's efforts to improve self-care  Current Status:   The patient reports decreased irritability, decreased anxiety, and decreased worry along with improved mood.  Patient Progress:   Good. The patient reports doing well  but  experiencing a period of depression that caused patient to miss her last appointment as she was very emotional and could not get out of bed. She also reports experiencing increased negative thoughts and guilt during that time. Therapist works with patient to identify counter statements to use to intervene when having negative thought patterns. Patient expresses frustration about trying to enroll at the local community college as extensive information about patient's mental healthy history was requested per patient's report. She has decided to no longer  pursue involvement at that particular college but plans to try to roll in a no other community college. Patient reports increased thoughts and concerns about her sister and expresses sadness as well as frustration. Patient is concerned that sister is not addressing emotional problems and fears her sister may have problems if she becomes pregnant as patient had when she was pregnant. Therapist works with patient to process feelings and to discuss boundary issues and limits of patient's responsibility in patient's relationship with her sister.   Target Goals:   1. Increase understanding of mental disorder and improve self-care ; 1: 1 psychoeducation and cognitive behavior therapy, one time every 1-4 weeks     2. Improve communication skills (decrease yelling) and ability to identify and verbalize feelings; 1-1 psychotherapy ( supportive and cognitive therapy) one time every 1-4 weeks    3. Decrease anxiety and improve relaxation/coping techniques; 1-1 psychotherapy (supportive and cognitive behavioral therapy) one time every 1-4 weeks    4. increase positive thought patterns and decreased negative thought patterns; 1:1 psychotherapy (supportive and cognitive therapy) one time every 1-4 weeks  Last Reviewed:   01/19/2012  Goals Addressed Today:    Goals 3 and 4  Impression/Diagnosis:   The patient has a long-standing history of symptoms of anxiety and depression along with mood swings beginning in early adolescence. Symptoms have worsened since the birth of her daughter in January 2011. Current symptoms now include anxiety, depressed mood, ruminating thoughts, irritability, mood swings, and crying spells. Diagnosis: bipolar 1 disorder  Diagnosis:  Axis I:  1. Bipolar 1 disorder             Axis II: Deferred

## 2012-02-10 ENCOUNTER — Ambulatory Visit (INDEPENDENT_AMBULATORY_CARE_PROVIDER_SITE_OTHER): Payer: 59 | Admitting: Psychiatry

## 2012-02-10 DIAGNOSIS — F319 Bipolar disorder, unspecified: Secondary | ICD-10-CM

## 2012-02-10 NOTE — Patient Instructions (Signed)
Discussed orally 

## 2012-02-10 NOTE — Progress Notes (Signed)
Patient:  Kristin Stephens   DOB: Mar 15, 1986  MR Number: [id number]  Location: [facility]:  [address] [place],  [place], [address]  Start: Monday 02/10/2012 9:15 AM End: Monday 02/10/2012 10:00 AM  Provider/Observer:     Florencia Reasons, MSW, LCSW   Chief Complaint:      Chief Complaint  Patient presents with  . Anxiety    Reason For Service:     The patient is a returning patient to this practice and reports increased symptoms of anxiety and depression. She has a history of bipolar disorder. Patient is seen today for follow up appointment.  Interventions Strategy:  Supportive therapy,cognitive therapy  Participation Level:   Active  Participation Quality:  Appropriate    Behavioral Observation:  Casual, Alert, less anxious, improved mood  Current Psychosocial Factors:  Patient reports stress related to increased thoughts about her father  Content of Session:   Reviewing symptoms, processing feelings, identifying and challenging cognitive distortions, discussing patient's spirituality and effects on her thought patterns, mood and behavior  Current Status:   The patient reports decreased irritability, improved mood but increased worry.  Patient Progress:   Good. The patient reports doing well  but feeling strange since she is decreasing Paxil and taking Celexa. Patient states feeling lightheaded at times. She will discuss this at her next appointment with Dr. Dan Humphreys. Patient reports increased thoughts about her father with whom she does not have a relationship and has not seen since she was 26 years old. These thoughts appear to have been triggered by patient's relationship with her to a half-year-old daughter. Patient expresses worry and anxiety about her feelings for her father due to to her spiritual beliefs. She expresses ambivalent feelings but is feeling pressure to forgive her father due to her spiritual convictions. Therapist works with patient to process her  feelings, discuss grief and loss issues,  and to identify and challenge cognitive distortions.     Target Goals:   1. Increase understanding of mental disorder and improve self-care ; 1: 1 psychoeducation and cognitive behavior therapy, one time every 1-4 weeks     2. Improve communication skills (decrease yelling) and ability to identify and verbalize feelings; 1-1 psychotherapy ( supportive and cognitive therapy) one time every 1-4 weeks    3. Decrease anxiety and improve relaxation/coping techniques; 1-1 psychotherapy (supportive and cognitive behavioral therapy) one time every 1-4 weeks    4. increase positive thought patterns and decreased negative thought patterns; 1:1 psychotherapy (supportive and cognitive therapy) one time every 1-4 weeks  Last Reviewed:   01/19/2012  Goals Addressed Today:    Goals 3 and 4  Impression/Diagnosis:   The patient has a long-standing history of symptoms of anxiety and depression along with mood swings beginning in early adolescence. Symptoms have worsened since the birth of her daughter in January 2011. Current symptoms now include anxiety, depressed mood, ruminating thoughts, irritability, mood swings, and crying spells. Diagnosis: bipolar 1 disorder  Diagnosis:  Axis I:  1. Bipolar 1 disorder             Axis II: Deferred

## 2012-02-18 ENCOUNTER — Ambulatory Visit (HOSPITAL_COMMUNITY)
Admission: RE | Admit: 2012-02-18 | Discharge: 2012-02-18 | Disposition: A | Discharge status: Home / Self Care | Payer: Medicaid Other | Source: Ambulatory Visit | Attending: Orthopedic Surgery | Admitting: Orthopedic Surgery

## 2012-02-18 DIAGNOSIS — IMO0001 Reserved for inherently not codable concepts without codable children: Secondary | ICD-10-CM | POA: Insufficient documentation

## 2012-02-18 DIAGNOSIS — R262 Difficulty in walking, not elsewhere classified: Secondary | ICD-10-CM | POA: Insufficient documentation

## 2012-02-18 NOTE — Evaluation (Signed)
Physical Therapy Evaluation  Patient Details  Name: Kristin Stephens MRN: [id number] Date of Birth: 1985/10/19  Today's Date: 02/18/2012    Visit#: 1  of 4   Re-eval: 02/27/12 Assessment Diagnosis: L ankle  Prior Therapy: none (Pt was given red t-band for HEP this did not help.)  Authorization: medicaid  APast Medical History:  Past Medical History  Diagnosis Date  . Esophageal reflux   . Migraine, unspecified, without mention of intractable migraine without mention of status migrainosus   . Hypertension   . IBS (irritable bowel syndrome)   . Allergic rhinitis   . Anxiety   . Depression   . Precancerous changes of the cervix   . Asthma   . Coarctation of aorta   . Bipolar affective disorder   . Liver lesion     7cm FNH  . Body mass index (BMI) of 30.0-30.9 in adult 2009 172 LBS   Past Surgical History:  Past Surgical History  Procedure Date  . Post asd/vsd repair ,coarc repair 1989   . Cath cardiac 2008   . Cesarean section 2011  . Esophagogastroduodenoscopy 04/04/2008      Normal esophagus/Mild patchy erythema in the antrum/ Normal duodenal bulb, normal small bowel biopsy  . Flexible sigmoidoscopy 04/04/2008      Fields- Small internal hemorrhoids( poor bowel prep)    Subjective Symptoms/Limitations Symptoms: Kristin Stephens states that about 12 weeks ago she work up with a bruise on her left ankle.  The ankle continued to swell and have pain so she went to her MD.  She has had a cortisone shot that has helped but did not get rid of all her pain.  She has now been referred to PT.   How long can you sit comfortably?: no problem How long can you stand comfortably?: for 15 minutes. How long can you walk comfortably?: for 30 minutes. Pain Assessment Currently in Pain?: Yes Pain Score:   5 (goes up as high as an 8/10) Pain Location: Ankle Pain Orientation: Left;Lateral Pain Type: Chronic pain      Prior Functioning  Home Living Lives With: Family Type of Home:  House Home Access: Stairs to enter [employer] of Steps: 2 Prior Function Vocation: Student Leisure: Hobbies-yes (Comment) Comments: play with 2 yo daughter-unable to at this time  Cognition/Observation Cognition Overall Cognitive Status: Appears within functional limits for tasks assessed  Sensation/Coordination/Flexibility/Functional Tests Functional Tests Functional Tests: LEFS 38  Assessment LLE AROM (degrees) Left Ankle Dorsiflexion: 10  Left Ankle Plantar Flexion: 45  Left Ankle Inversion: 15  Left Ankle Eversion: 18  LLE Strength Left Ankle Dorsiflexion: 3+/5 Left Ankle Plantar Flexion: 3+/5 Left Ankle Inversion: 3+/5 Left Ankle Eversion: 3+/5  Exercise/Treatments Mobility/Balance  Static Standing Balance Single Leg Stance - Right Leg: 41  Single Leg Stance - Left Leg: 18    SLS:  (18sec)   Ankle Exercises - Supine Isometrics: 5 Other Supine Ankle Exercises: ROM Modalities Modalities: Iontophoresis Iontophoresis Type of Iontophoresis: Dexamethasone Location: lateral aspect of L ankle  Physical Therapy Assessment and Plan PT Assessment and Plan Clinical Impression Statement:  Pt withchronic ankle sprain with decreased ROM, strength and proprioception who will benefit from skilled PT to improve sx. Pt will benefit from skilled therapeutic intervention in order to improve on the following deficits: Decreased strength;Difficulty walking;Pain;Decreased activity tolerance;Decreased range of motion PT Frequency: Min 2X/week PT Duration:  (2 weeks) PT Treatment/Interventions: Modalities;Therapeutic exercise PT Plan: Pt to be seen three more visits; next visit heel raise  toe raises; PRE sitting for DF; inversion; side-lying ER; and gastroc stretch;  3rd visit t-band; walking on heel/ toe; vector    Goals Home Exercise Program Pt will Perform Home Exercise Program: Independently PT Short Term Goals Time to Complete Short Term Goals: 2 weeks PT Short  Term Goal 1: Pain no greater than a 4 PT Short Term Goal 2: strength improved to 4/10  Problem List Patient Active Problem List  Diagnosis  . ATTENTION DEFICIT DISORDER, ADULT  . MIGRAINE HEADACHE  . HYPERTENSION  . ASVD  . RHINITIS MEDICAMENTOSA  . ALLERGIC RHINITIS  . GERD  . IBS  . ECZEMA  . VSD  . CONGENITAL HEART DISEASE  . COARCTATION OF AORTA  . Asthma, intrinsic  . Bipolar 1 disorder  . Difficulty in walking    PT Plan of Care PT Home Exercise Plan: given  GP    Kristin Stephens 02/18/2012, 11:04 AM  Physician Documentation Your signature is required to indicate approval of the treatment plan as stated above.  Please sign and either send electronically or make a copy of this report for your files and return this physician signed original.   Please mark one 1.__approve of plan  2. ___approve of plan with the following conditions.   ______________________________                                                          _____________________ Physician Signature                                                                                                             Date

## 2012-02-19 ENCOUNTER — Ambulatory Visit (HOSPITAL_COMMUNITY)
Admission: RE | Admit: 2012-02-19 | Discharge: 2012-02-19 | Disposition: A | Discharge status: Home / Self Care | Payer: Medicaid Other | Source: Ambulatory Visit | Attending: Orthopedic Surgery | Admitting: Orthopedic Surgery

## 2012-02-19 NOTE — Progress Notes (Signed)
Physical Therapy Treatment Patient Details  Name: Kristin Stephens MRN: [id number] Date of Birth: 11/24/1985  Today's Date: 02/19/2012 Time: 1023-1110 PT Time Calculation (min): 47 min Charges: 35' TE, 10' ionto Visit#: 2  of 4   Re-eval: 02/27/12    Authorization: medicaid  Authorization Time Period:    Authorization Visit#: 2  of 4    Subjective: Symptoms/Limitations Symptoms: Pt reports that she went home and did some of her exercises that the doctor have given her (red t-band 4 way ankle).  Her c/co today remains difficulty standing and walking for any length of time due to pain.   Pain Assessment Currently in Pain?: Yes Pain Score:   5 Pain Location: Ankle Pain Orientation: Left;Lateral  Precautions/Restrictions     Exercise/Treatments Ankle Stretches Soleus Stretch: 3 reps;30 seconds Gastroc Stretch: 3 reps;30 seconds Ankle Exercises - Standing Heel Raises: 15 reps Toe Raise: 15 reps Ankle Exercises - Supine Other Supine Ankle Exercises: PRE for DF, PF, INV and EV x10 eac Ankle Exercises - Sidelying Ankle Inversion: AROM;Left;15 reps Ankle Eversion: AROM;Left;15 reps Modalities Modalities: Iontophoresis Iontophoresis Type of Iontophoresis: Dexamethasone Location: lateral aspect of L ankle Dose: 40 mA-min, @ 4.0 mA Time: 8 minutes  Physical Therapy Assessment and Plan PT Assessment and Plan Clinical Impression Statement: Pt able to complete all activities with moderate pain which disappated after activity.  Continues to have decreased pain after ionto treatment.  PT Duration:  (2 weeks) PT Plan: F/u w/ionto treatment. Continue to progress strength and improve her HEP and decrease pain by 3rd visit t-band; walking on heel/ toe; vector    Goals    Problem List Patient Active Problem List  Diagnosis  . ATTENTION DEFICIT DISORDER, ADULT  . MIGRAINE HEADACHE  . HYPERTENSION  . ASVD  . RHINITIS MEDICAMENTOSA  . ALLERGIC RHINITIS  . GERD  . IBS  .  ECZEMA  . VSD  . CONGENITAL HEART DISEASE  . COARCTATION OF AORTA  . Asthma, intrinsic  . Bipolar 1 disorder  . Difficulty in walking    PT - End of Session Activity Tolerance: Patient tolerated treatment well  GP    Dannya Pitkin 02/19/2012, 11:13 AM

## 2012-02-23 ENCOUNTER — Ambulatory Visit (HOSPITAL_COMMUNITY)
Admission: RE | Admit: 2012-02-23 | Discharge: 2012-02-23 | Disposition: A | Discharge status: Home / Self Care | Payer: Medicaid Other | Source: Ambulatory Visit | Attending: Orthopedic Surgery | Admitting: Orthopedic Surgery

## 2012-02-23 NOTE — Progress Notes (Signed)
Physical Therapy Treatment Patient Details  Name: Kristin Stephens MRN: [id number] Date of Birth: [birth year]  Today's Date: 02/23/2012 Time: 1022-1120 PT Time Calculation (min): 58 min Visit#: 3  of 4   Re-eval: 02/27/12 Authorization: medicaid  Authorization Visit#: 3  of 4   Charges:  therex 36' iontophoresis 16'  Subjective: Symptoms/Limitations Symptoms: Pt. states she woke last night with pain and has never done this before.  Unsure if its because of the cold weather. Pain Assessment Currently in Pain?: Yes Pain Score:   3 Pain Location: Ankle Pain Orientation: Left;Lateral;Anterior   Exercise/Treatments Ankle Stretches Soleus Stretch: 3 reps;30 seconds;Limitations Soleus Stretch Limitations: with slant board Gastroc Stretch: 3 reps;30 seconds;Limitations Gastroc Stretch Limitations: slant board Ankle Exercises - Standing SLS: 35" max of 3 Heel Raises: 15 reps Toe Raise: 15 reps Ankle Exercises - Supine Other Supine Ankle Exercises: PRE for DF, PF, INV and EV x10 eac Ankle Exercises - Sidelying Ankle Inversion: AROM;Left;15 reps Ankle Eversion: AROM;Left;15 reps    Modalities Modalities: Iontophoresis Iontophoresis Type of Iontophoresis: Dexamethasone Location: Lateral aspect of L ankle Dose: 60 mA-min @ 3.0 mA Time: 75'  Physical Therapy Assessment and Plan PT Assessment and Plan Clinical Impression Statement: Pt. continues to be intolerable of increased activities, however encouraged pt. to begin walking program.  Added heel and toe walking and vector stance without difficulty or pain. PT Duration:  (2 weeks) PT Plan: Re-evaluate next visit (last visit per medicaid).     Problem List Patient Active Problem List  Diagnosis  . ATTENTION DEFICIT DISORDER, ADULT  . MIGRAINE HEADACHE  . HYPERTENSION  . ASVD  . RHINITIS MEDICAMENTOSA  . ALLERGIC RHINITIS  . GERD  . IBS  . ECZEMA  . VSD  . CONGENITAL HEART DISEASE  . COARCTATION OF AORTA  .  Asthma, intrinsic  . Bipolar 1 disorder  . Difficulty in walking    PT - End of Session Activity Tolerance: Patient tolerated treatment well General Behavior During Session: [facility] for tasks performed Cognition: [facility] for tasks performed   Lurena Nida, PTA/CLT 02/23/2012, 11:23 AM

## 2012-02-25 ENCOUNTER — Ambulatory Visit (HOSPITAL_COMMUNITY)
Admission: RE | Admit: 2012-02-25 | Discharge: 2012-02-25 | Disposition: A | Discharge status: Home / Self Care | Payer: Medicaid Other | Source: Ambulatory Visit | Attending: Orthopedic Surgery | Admitting: Orthopedic Surgery

## 2012-02-25 NOTE — Evaluation (Cosign Needed)
Physical Therapy Re-evaluation  Patient Details  Name: Kristin Stephens MRN: [id number] Date of Birth: 03/26/1986  Today's Date: 02/25/2012 Time: 1022-1110 PT Time Calculation (min): 48 min  Visit#: 4  of 4   Re-eval:   Assessment Diagnosis: L ankle  Prior Therapy: none (Pt was given red t-band for HEP this did not help.)  Authorization:   medicaid  Authorization Visit#:  4 of   4  Past Medical History:  Past Medical History  Diagnosis Date  . Esophageal reflux   . Migraine, unspecified, without mention of intractable migraine without mention of status migrainosus   . Hypertension   . IBS (irritable bowel syndrome)   . Allergic rhinitis   . Anxiety   . Depression   . Precancerous changes of the cervix   . Asthma   . Coarctation of aorta   . Bipolar affective disorder   . Liver lesion     7cm FNH  . Body mass index (BMI) of 30.0-30.9 in adult 2009 172 LBS   Past Surgical History:  Past Surgical History  Procedure Date  . Post asd/vsd repair ,coarc repair 1989   . Cath cardiac 2008   . Cesarean section 2011  . Esophagogastroduodenoscopy 04/04/2008      Normal esophagus/Mild patchy erythema in the antrum/ Normal duodenal bulb, normal small bowel biopsy  . Flexible sigmoidoscopy 04/04/2008      Fields- Small internal hemorrhoids( poor bowel prep)    Subjective Symptoms/Limitations Symptoms: Pt states she is doing her exercises at home. How long can you stand comfortably?: able to stand for 20 minutes was 15 How long can you walk comfortably?: Pt states that she is able to walk 30-45 minutes was 30  Pain Assessment Currently in Pain?: Yes Pain Score:   3 (highest has been a 7/10)   Prior Functioning  Home Living Lives With: Family Type of Home: House Home Access: Stairs to enter [employer] of Steps: 2 Prior Function Vocation: Consulting civil engineer Leisure: Hobbies-yes (Comment)  Cognition/Observation Cognition Overall Cognitive Status: Appears within  functional limits for tasks assessed  Sensation/Coordination/Flexibility/Functional Tests Functional Tests Functional Tests: LEFS 38  Assessment LLE AROM (degrees) Left Ankle Dorsiflexion: 12  (was 10) Left Ankle Plantar Flexion: 60  (was at 45) Left Ankle Inversion: 25  (was 15) Left Ankle Eversion: 18  (was 18) LLE Strength Left Ankle Dorsiflexion: 3+/5 (was 4-/5 was 3+/5) Left Ankle Plantar Flexion: 4/5 (was 3+/5) Left Ankle Inversion:  (4-/5 was 3+/5) Left Ankle Eversion:  (was 4-/5 was 3+5)  Exercise/Treatments Mobility/Balance  Static Standing Balance Single Leg Stance - Right Leg: 41  Single Leg Stance - Left Leg: 24  (was 18)   Ankle Stretches Soleus Stretch: 3 reps;30 seconds;Limitations Soleus Stretch Limitations: with slant board Gastroc Stretch: 3 reps;30 seconds;Limitations Gastroc Stretch Limitations: slant board   Ankle Exercises - Standing SLS: 35" max of 3 Heel Raises: 15 reps Toe Raise: 15 reps Ankle Exercises - Supine Other Supine Ankle Exercises: PRE for DF x15 3# eac Ankle Exercises - Sidelying Ankle Inversion: Strengthening;Left;15 reps;Weights Ankle Inversion Weights (lbs): 3 Ankle Eversion: Strengthening;Left;15 reps;Weights Ankle Eversion Weights (lbs): 3 Modalities Modalities: Iontophoresis Iontophoresis Type of Iontophoresis: Dexamethasone Location: lateral aspect of ankle Dose: 69mA-min @ 4.0  Physical Therapy Assessment and Plan PT Assessment and Plan Clinical Impression Statement: Pt improving in all aspects but not back to previous level.  Pt has HEP and is doing I.  insurance has ran out therefore pt wll continue exercises on her own at  home. PT Plan: D/C to HEP    Goals Home Exercise Program PT Goal: Perform Home Exercise Program - Progress: Met PT Short Term Goals PT Short Term Goal 1 - Progress: Progressing toward goal PT Short Term Goal 2 - Progress: Progressing toward goal  Problem List Patient Active Problem List    Diagnosis  . ATTENTION DEFICIT DISORDER, ADULT  . MIGRAINE HEADACHE  . HYPERTENSION  . ASVD  . RHINITIS MEDICAMENTOSA  . ALLERGIC RHINITIS  . GERD  . IBS  . ECZEMA  . VSD  . CONGENITAL HEART DISEASE  . COARCTATION OF AORTA  . Asthma, intrinsic  . Bipolar 1 disorder  . Difficulty in walking    General Behavior During Session: [facility] for tasks performed  GP    Giovonnie Trettel,CINDY 02/25/2012, 11:01 AM  Physician Documentation Your signature is required to indicate approval of the treatment plan as stated above.  Please sign and either send electronically or make a copy of this report for your files and return this physician signed original.   Please mark one 1.__approve of plan  2. ___approve of plan with the following conditions.   ______________________________                                                          _____________________ Physician Signature                                                                                                             Date

## 2012-03-08 ENCOUNTER — Encounter (HOSPITAL_COMMUNITY): Payer: Self-pay | Admitting: Psychiatry

## 2012-03-08 ENCOUNTER — Ambulatory Visit (INDEPENDENT_AMBULATORY_CARE_PROVIDER_SITE_OTHER): Payer: 59 | Admitting: Psychiatry

## 2012-03-08 VITALS — Wt 221.8 lb

## 2012-03-08 DIAGNOSIS — F39 Unspecified mood [affective] disorder: Secondary | ICD-10-CM

## 2012-03-08 DIAGNOSIS — F988 Other specified behavioral and emotional disorders with onset usually occurring in childhood and adolescence: Secondary | ICD-10-CM

## 2012-03-08 DIAGNOSIS — F319 Bipolar disorder, unspecified: Secondary | ICD-10-CM

## 2012-03-08 MED ORDER — LAMOTRIGINE 100 MG PO TABS: 100.0000 mg | ORAL_TABLET | Freq: Every day | ORAL | Status: DC

## 2012-03-08 MED ORDER — CITALOPRAM HYDROBROMIDE 20 MG PO TABS: ORAL_TABLET | ORAL | Status: DC

## 2012-03-08 NOTE — Progress Notes (Signed)
Chief complaint Chief Complaint  Patient presents with  . Depression  . Follow-up  . Manic Behavior  . Medication Refill   History of present illness Patient is 26 year old Caucasian female who came for her followup appointment.  She is compliant with the psychotropic medications.  She has been trying to be more healthy, eating more healthy, and getting back into some exercise.  She has stopped smoking and she feels better too.  She notes that her boy friend's breath stinks to her now.  She is using Adderall XR 20 and will be needing the regular in addition when she gets back into school after the New Years'.  The switch from Paxil to Celexa has been very helpful.  She was scared that her mood would get depressed.  Her family has noted that her attitude is happier and easier to get along with.    Current psychiatric medication Lamictal 150 mg daily Celexa 30 mg daily Adderall 10 mg daily however is prescribed 20 mg , she takes only when she needed prescribed by primary care physician Xanax from nurse practitioner.  Past psychiatric history Patient has been seeing in this office since April 2012 by Dr Lolly Mustache however she has been seeing therapist in this office for more than 10 years.  Patient endorse history of mood swing, depression, anger and poor attention and concentration.  Patient also endorse history of self abusive behavior and endorse cutting herself with a razor blade when her grandmother was dying.  In the past she was prescribed Wellbutrin but she was admitted on medical for the she never took the Wellbutrin.  Patient denies any history of suicidal attempt or any inpatient psychiatric treatment.  She had history of burst of energy with excessive shopping, cleaning, road rage and impulse eating.   Psychosocial history Patient was raised by her mother and grandmother when her father left when she was only 53 years old.  Patient told her father was never in her life and she was very close  to her grandmother.  Patient has a lot of resentment and anger towards her father.  Patient grandmother died when she was only 64 years old.  Patient has one daughter.  Patient has a very good relationship with her boyfriend whose been very supportive.  Family history family history includes ADD / ADHD in her brothers and sister; Anxiety disorder in her brother; Bipolar disorder in her paternal grandmother; Dementia in her maternal grandmother and paternal grandmother; Emphysema in her paternal grandmother; Heart disease in her maternal grandmother and paternal grandmother; Hyperlipidemia in her mother; Hypertension in her mother; Personality disorder in her father; Prostate cancer in her maternal grandfather; and Stroke in her maternal grandfather.  Alcohol and substance use history Patient endorse history of using marijuana and drugs and alcohol in her teens when she was self-medicating for her depression and anxiety.  However she claims to be sober since then.  Patient denies any history of DWI, tremors or blackout.  Education and work history Patient has GED and currently she is enrolled in [employer].   She is hoping to finish her schooling in 2015.  Patient has a history of attention and concentration problem which was formally tested by psychologist in this office.    Medical history Patient has history of headache, GERD, allergic rhinitis, hypertension, irritable bowel syndrome and aortic Valve repaired.    Mental status examination Patient is well groomed and casually dressed. She is calm and cooperative.  She described her mood  is better and her affect is improved from the past   She denies any active or passive suicidal thoughts or homicidal thoughts.  Her speech is fast but clear and coherent.  Her thought process is logical linear and goal-directed.  There no psychotic symptoms present at this time.  Her attention and concentration this could.  She's oriented x3.  Her insight  judgment and impulse control is okay.  Assessment Axis I Mood disorder NOS, Seasonal Affective Disorder, ADD by history.  Axis II deferred Axis III see medical history Axis IV mild to moderate Axis V 60-65  Plan I took her vitals.  I reviewed CC, tobacco/med/surg Hx, meds effects/ side effects, problem list, therapies and responses as well as current situation/symptoms discussed options. See orders and pt instructions for more details. New Dx: Seasonal Affective Disorder  Kristin Stephens, Kristin Stephens 03/08/2012

## 2012-03-08 NOTE — Patient Instructions (Addendum)
To prevent dementia Play cards or broad games Learn a musical instrument Dancing Use the other hand to brush teeth or open doors Get regular exercise and read or study some new subject  Keep up the reading!  Have a happy holiday!  Keep the light therapy up.  Light therapy can be very helpful for sadness that is associated with a particular season of the year.  A light therapy light such as the SADelite from [employer] can be very helpful for the management of the condition of Seasonal Affective Disorder.  One should try this light first at a ealry time like before 8AM for only 5 minutes the first day, then 10 minutes on the second day.   If irritability occurs then shift to evening time dosing like after 6PM.   If mania occurs then stop and consult your prescriber on how to proceed. If no response or minimal response is noted then increase to 15 minutes a day.  You can gradually increase until the symptoms of depression subside. Some people may need only 10 to 15 minutes a day, while others may need as much as 30 minutes in the morning and 30 minutes in the evening, but this large amount of time should be used under the direct guidance of your prescriber. Again mania can occur with this, particularly in those with an official diagnosis of bipolar disorder.  In that case the doseing (amount of time a day) needs to be discussed with your prescriber.  BlueMax is a brand of a Compact Florescent Lamp that may be tried as al alternative to the above.  The higher the strength the better.  After a month on the 100mg  of Lamictal could try going to 1/2 tab a day.

## 2012-03-11 ENCOUNTER — Ambulatory Visit (INDEPENDENT_AMBULATORY_CARE_PROVIDER_SITE_OTHER): Payer: Medicaid Other | Admitting: Otolaryngology

## 2012-03-11 DIAGNOSIS — J310 Chronic rhinitis: Secondary | ICD-10-CM

## 2012-03-11 DIAGNOSIS — J342 Deviated nasal septum: Secondary | ICD-10-CM

## 2012-03-15 ENCOUNTER — Ambulatory Visit (INDEPENDENT_AMBULATORY_CARE_PROVIDER_SITE_OTHER): Payer: 59 | Admitting: Psychiatry

## 2012-03-15 DIAGNOSIS — F319 Bipolar disorder, unspecified: Secondary | ICD-10-CM

## 2012-03-15 NOTE — Patient Instructions (Signed)
Discussed orally 

## 2012-03-15 NOTE — Progress Notes (Addendum)
Patient:  Kristin Stephens   DOB: 22-Nov-1985  MR Number: [id number]  Location: [facility]:  [address] [place],  [place], [address]  Start: Monday 03/15/2012 10:05 AM End: Monday 03/15/2012 10:50 AM  Provider/Observer:     Florencia Reasons, MSW, LCSW   Chief Complaint:      Chief Complaint  Patient presents with  . Anxiety    Reason For Service:     The patient is a returning patient to this practice and reports increased symptoms of anxiety and depression. She has a history of bipolar disorder. Patient is seen today for follow up appointment.  Interventions Strategy:  Supportive therapy,cognitive therapy  Participation Level:   Active  Participation Quality:  Appropriate    Behavioral Observation:  Casual, Alert, less anxious, improved mood  Current Psychosocial Factors:  Patient reports stress related to daughter being sick last night and taking her to ER.  She reports she plans to attend orientation tomorrow to resume attendance in college.  Content of Session:   Reviewing symptoms, processing feelings, identifying and challenging cognitive distortions, identifying ways to improve self-care and maintain consistency, identifying ways to develop a schedule and improve structure  Current Status:   The patient reports decreased irritability, improved mood, decreased  worry.  Patient Progress:   Good. The patient reports feeling much better since she has discontinued taking Paxil and is taking Celexa as prescribed by Dr. Dan Humphreys. She reports decreased irritability and improved communication in the relationship with her boyfriend. She expresses appropriate concern about her 75-year-old daughter who has been complaining of headaches. Patient expresses frustration about the response she received from the ER when she took her daughter there last night. She is following up with her pediatrician Wednesday. Patient is excited that she is attending orientation tomorrow to begin  taking classes at [employer] in January. She expresses some anxiety regarding this and reports fear of failing due to to some of the negative experiences she had the last academic institution she attended. Patient also expresses some anxiety about managing parenting responsibilities and attending school Therapist works with patient to process her feelings and to identify patient's successes at the last school she attended. Therapist also works with patient to identify and challenge cognitive distortions. Therapist encourages patient to improve self-care and to establish consistency. Therapist provides patient with a lifestyle and mood tracking log and encourages patient to develop a schedule to manage her time effectively.     Target Goals:   1. Increase understanding of mental disorder and improve self-care ; 1: 1 psychoeducation and cognitive behavior therapy, one time every 1-4 weeks     2. Improve communication skills (decrease yelling) and ability to identify and verbalize feelings; 1-1 psychotherapy ( supportive and cognitive therapy) one time every 1-4 weeks    3. Decrease anxiety and improve relaxation/coping techniques; 1-1 psychotherapy (supportive and cognitive behavioral therapy) one time every 1-4 weeks    4. increase positive thought patterns and decreased negative thought patterns; 1:1 psychotherapy (supportive and cognitive therapy) one time every 1-4 weeks  Last Reviewed:   01/19/2012  Goals Addressed Today:    Goals 3 and 4  Impression/Diagnosis:   The patient has a long-standing history of symptoms of anxiety and depression along with mood swings beginning in early adolescence. Symptoms have worsened since the birth of her daughter in January 2011. Current symptoms now include anxiety, depressed mood, ruminating thoughts, irritability, mood swings, and crying spells. Diagnosis: bipolar 1 disorder  Diagnosis:  Axis  I:  1. Bipolar 1 disorder             Axis II: Deferred

## 2012-03-29 ENCOUNTER — Encounter: Payer: Self-pay | Admitting: *Deleted

## 2012-04-01 ENCOUNTER — Ambulatory Visit (INDEPENDENT_AMBULATORY_CARE_PROVIDER_SITE_OTHER): Payer: Medicaid Other | Admitting: Otolaryngology

## 2012-04-01 DIAGNOSIS — J343 Hypertrophy of nasal turbinates: Secondary | ICD-10-CM

## 2012-04-01 DIAGNOSIS — J342 Deviated nasal septum: Secondary | ICD-10-CM

## 2012-04-12 ENCOUNTER — Ambulatory Visit (INDEPENDENT_AMBULATORY_CARE_PROVIDER_SITE_OTHER): Payer: 59 | Admitting: Psychiatry

## 2012-04-12 DIAGNOSIS — F319 Bipolar disorder, unspecified: Secondary | ICD-10-CM

## 2012-04-12 NOTE — Progress Notes (Signed)
Patient:  Kristin Stephens   DOB: 1985-12-21  MR Number: [id number]  Location: [facility]:  [address] [place],  [place], [address]  Start: Monday 04/12/2012 10:00 AM End: Monday 04/12/2012 10:45 AM  Provider/Observer:     Florencia Reasons, MSW, LCSW   Chief Complaint:      Chief Complaint  Patient presents with  . Anxiety    Reason For Service:     The patient is a returning patient to this practice and reports increased symptoms of anxiety and depression. She has a history of bipolar disorder. Patient is seen today for follow up appointment.  Interventions Strategy:  Supportive therapy,cognitive therapy  Participation Level:   Active  Participation Quality:  Appropriate    Behavioral Observation:  Casual, Alert, less anxious, improved mood  Current Psychosocial Factors:  Patient began attending college April 05, 2012,  Content of Session:   Reviewing symptoms, processing feelings, reinforcing patient's efforts  to improve self-care and maintain consistency, identifying relaxation and coping techniques  Current Status:   The patient reports continued improved mood and decreased irritability but some anxiety regarding performance in school.   Patient Progress:   Good. The patient reports beginning school on 04/06/2011. She reports liking her classes and her instructors. She also has taken the initiative to call with her instructors regarding accommodations as patient has ADHD. She already has made efforts to sit at the front of the class. Patient also has made efforts to improve organizational skills and has developed a study schedule. She also has begun improving her eating habits and is in the process of determining a workout schedule. Patient continues to experience anxiety about school performance. Therapist works with patient to identify relaxation and coping techniques.     Target Goals:   1. Increase understanding of mental disorder and improve self-care ; 1:  1 psychoeducation and cognitive behavior therapy, one time every 1-4 weeks     2. Improve communication skills (decrease yelling) and ability to identify and verbalize feelings; 1-1 psychotherapy ( supportive and cognitive therapy) one time every 1-4 weeks    3. Decrease anxiety and improve relaxation/coping techniques; 1-1 psychotherapy (supportive and cognitive behavioral therapy) one time every 1-4 weeks    4. Increase positive thought patterns and decreased negative thought patterns; 1:1 psychotherapy (supportive and cognitive therapy) one time every 1-4 weeks  Last Reviewed:   01/19/2012  Goals Addressed Today:    Goal 3   Impression/Diagnosis:   The patient has a long-standing history of symptoms of anxiety and depression along with mood swings beginning in early adolescence. Symptoms have worsened since the birth of her daughter in January 2011. Current symptoms now include anxiety, depressed mood, ruminating thoughts, irritability, mood swings, and crying spells. Diagnosis: bipolar 1 disorder  Diagnosis:  Axis I:  1. Bipolar 1 disorder             Axis II: Deferred

## 2012-04-12 NOTE — Patient Instructions (Signed)
Discussed orally 

## 2012-04-20 ENCOUNTER — Ambulatory Visit (INDEPENDENT_AMBULATORY_CARE_PROVIDER_SITE_OTHER): Payer: 59 | Admitting: Psychology

## 2012-04-20 ENCOUNTER — Ambulatory Visit (HOSPITAL_COMMUNITY): Payer: Self-pay | Admitting: Psychology

## 2012-04-20 ENCOUNTER — Encounter (HOSPITAL_COMMUNITY): Payer: Self-pay | Admitting: Psychology

## 2012-04-20 DIAGNOSIS — F319 Bipolar disorder, unspecified: Secondary | ICD-10-CM

## 2012-04-20 DIAGNOSIS — F909 Attention-deficit hyperactivity disorder, unspecified type: Secondary | ICD-10-CM

## 2012-04-20 NOTE — Progress Notes (Signed)
Patient:   Kristin Stephens   DOB:   1985-12-16  MR Number:  [id number]  Location:  [facility] [address] [place] [address] Dept: [phone]           Date of Service:   04/20/2012  Start Time:   10 AM End Time:   11 AM  Provider/Observer:  Hershal Coria PSYD       Billing Code/Service: [phone]  Chief Complaint:     Chief Complaint  Patient presents with  . ADD  . Depression  . Manic Behavior  . Agitation  . Stress    Reason for Service:  The patient was referred by Dr. Dan Humphreys as well as the patient herself because she needed a formal evaluation regarding her attention/concentration issues for any type of adjustments or changes in school to help her with adjusting to attentional issues. The patient was diagnosed with attention deficit disorder when she was child and was tried on Ritalin initially. However that medication was stopped. One year ago when she returned back to school she continued with significant attention concentration issues and was tried on  Adderall and she found this to be very helpful. However, the patient does have a history of depression or depressive events as well as some manic or and she was diagnosed with bipolar disorder after an episode where it was initially thought to be solely postpartum depression but the symptoms lingered well after standard her additionally time. Because of the patient's family history and symptoms that included apparent manic episodes but mostly depressive events the diagnosis was changed and considered bipolar disorder. Part of the reason for this evaluation is to facilitate with differential diagnoses as well as provide objective measures of attention concentration and other factors of attentional issues.  Current Status:  The patient reports continued difficulties with school because of attention and concentration issues. The patient  reports that she finds it difficult to concentrate and focus.  Reliability of Information: The information was provided by the patient as well as with her review of numerous medical records including psychiatric pressure and primary care records.  Behavioral Observation: Kristin Stephens  presents as a 27 y.o.-year-old Right Caucasian Female who appeared her stated age. her dress was Appropriate and she was Well Groomed and her manners were Appropriate to the situation.  There were not any physical disabilities noted.  she displayed an appropriate level of cooperation and motivation.    Interactions:    Active   Attention:   within normal limits  Memory:   within normal limits  Visuo-spatial:   within normal limits  Speech (Volume):  normal  Speech:   normal pitch and normal volume  Thought Process:  Coherent  Though Content:  WNL  Orientation:   person, place, time/date and situation  Judgment:   Good  Planning:   Good  Affect:    Appropriate  Mood:    The patient did not appear to display any significant issues of anxiety or depression during the clinical interview.  Insight:   Good  Intelligence:   normal  Marital Status/Living: The patient was born in [place] and grew up in [place]. She is single but has a significant boyfriend as well as a 25-year-old daughter. The patient grew up with her sister and brother as well as her grandmother and her mother. She is a 37 year old sister and a 9 year old sister. She does not  see her father due to his decisions to not interact or engage with the family.  Current Employment: The patient is not working right now but is returning to school.  Past Employment:  The patient has no work history at this point.  Substance Use:  No concerns of substance abuse are reported.  the patient did have some episodes with drinking alcohol when she was much younger but also risky behaviors. The patient was tried on  a psychostimulant when she was 15 years or so this on the last couple of months.included engaging in some  Education:   GED  Medical History:   Past Medical History  Diagnosis Date  . Esophageal reflux   . Migraine, unspecified, without mention of intractable migraine without mention of status migrainosus   . Hypertension   . IBS (irritable bowel syndrome)   . Allergic rhinitis   . Anxiety   . Depression   . Precancerous changes of the cervix   . Asthma   . Coarctation of aorta   . Bipolar affective disorder   . Liver lesion     7cm FNH  . Body mass index (BMI) of 30.0-30.9 in adult 2009 172 LBS        Outpatient Encounter Prescriptions as of 04/20/2012  Medication Sig Dispense Refill  . acetaminophen (TYLENOL) 325 MG tablet Take 650 mg by mouth every 6 (six) hours as needed.        Marland Kitchen albuterol (PROVENTIL HFA;VENTOLIN HFA) 108 (90 BASE) MCG/ACT inhaler Inhale 2 puffs into the lungs every 6 (six) hours as needed for wheezing.  1 Inhaler  6  . albuterol (PROVENTIL,VENTOLIN) 90 MCG/ACT inhaler Inhale 2 puffs into the lungs every 6 (six) hours as needed.      . ALPRAZolam (XANAX) 0.5 MG tablet Take 0.5 mg by mouth at bedtime as needed.      Marland Kitchen amphetamine-dextroamphetamine (ADDERALL) 20 MG tablet Take 10 mg by mouth 2 (two) times daily. *to be taken atleast 4 hours apart*      . beclomethasone (QVAR) 80 MCG/ACT inhaler Inhale 2 puffs into the lungs 2 (two) times daily.  1 Inhaler  6  . bifidobacterium infantis (ALIGN) capsule Take 1 capsule by mouth daily.  14 capsule  0  . Biotin 5000 MCG TABS Take 5,000 mg by mouth daily.      . citalopram (CELEXA) 20 MG tablet Take one by mouth daily THREE tabs a day  90 tablet  2  . dexlansoprazole (DEXILANT) 60 MG capsule Take 60 mg by mouth daily.      Marland Kitchen lamoTRIgine (LAMICTAL) 100 MG tablet Take 1 tablet (100 mg total) by mouth daily.  30 tablet  2  . norgestimate-ethinyl estradiol (SPRINTEC 28) 0.25-35 MG-MCG tablet Take 1 tablet by mouth daily.       . simethicone (MYLICON) 125 MG chewable tablet Chew 125 mg by mouth every 6 (six) hours as needed.             the patient pleaded her GED but she is back in school taking college courses.   Sexual History:   History  Sexual Activity  . Sexually Active: Yes  . Birth Control/ Protection: Pill    Abuse/Trauma History:  The patient denies any history of abuse or trauma.  Psychiatric History:   the patient was first concerns about attention concentration and she was a young child and was tried on a psychostimulant medication. However, she also episodes of depression when she was 18 or 27  years old and had a hard time in life do to her grandmother being quite sick at the time. The patient has been tried on a number of different psychotropic medications to the years and is currently responding very well to Celexa. She also takes a mood stabilizer Lamictal. In the past, the patient had been treated with benzodiazepine either Xanax or Ativan for what was considered to be an anxiety disorder and panic attacks. However, she is now been diagnosed tentatively with asthmatic responses are both stress-induced as well as exercise-induced asthmatic responses. It is likely that some of the events that have been described as panic attacks in the past were more of anxiety coupled with an asthmatic response.   Family Med/Psych History:  Family History  Problem Relation Age of Onset  . Hyperlipidemia Mother   . Hypertension Mother   . Emphysema Paternal Grandmother   . Heart disease Paternal Grandmother   . Bipolar disorder Paternal Grandmother   . Dementia Paternal Grandmother   . Heart disease Maternal Grandmother   . Dementia Maternal Grandmother   . Stroke Maternal Grandfather   . Prostate cancer Maternal Grandfather   . Personality disorder Father   . Bipolar disorder Father   . ADD / ADHD Sister   . Anxiety disorder Brother   . ADD / ADHD Brother   . ADD / ADHD Brother     Risk of  Suicide/Violence: low The patient has had suicidal ideation in the past but denies any suicidal thoughts at this point.   Impression/DX:  The patient has a biological family history of bipolar disorder with both her father and her paternal grandmother. The patient had some difficulties in school and may have had some early manic and depressive events in her late teenage years. The patient suffered a significant depressive event after the birth of her daughter 3 years ago when she was 29 years old. Initially was diagnosed as postpartum depression which may very well been the case but she continued to have mood disturbance after that time and the diagnosis was changed to bipolar I disorder. She also been diagnosed with attention deficit disorder as well and is at times where she was diagnosed with anxiety. However, the anxiety while it may be situational induced does not includes her appear to include panic disorder. I do think that the things that have been described as panic events in the past were asthmatic responses.  Disposition/Plan:   we will conduct the comprehensive attention battery and the cab CPT 2 provide objective assessment of attention and concentration to facilitate differential diagnoses.   Diagnosis:    Axis I:   1. Bipolar I disorder   2. Unspecified hyperkinetic syndrome of childhood         Axis II: No diagnosis             Axis IV:  educational problems          Axis V:  51-60 moderate symptoms

## 2012-04-23 ENCOUNTER — Telehealth: Payer: Self-pay | Admitting: Internal Medicine

## 2012-04-23 NOTE — Telephone Encounter (Signed)
I am not too familiar with the pediatric neurologists in this region. Dr. Thad Ranger is the ped neuro in [employer].

## 2012-04-23 NOTE — Telephone Encounter (Signed)
Will forward to [facility] and Dr Tenny Craw

## 2012-04-23 NOTE — Telephone Encounter (Signed)
New problem:   Aware that Annice Pih if off today.     Patient daughter is having to see pediatric neurologist wanted to know who does Dr. Tenny Craw recommend .

## 2012-04-23 NOTE — Telephone Encounter (Signed)
Advised patients

## 2012-04-26 ENCOUNTER — Ambulatory Visit (INDEPENDENT_AMBULATORY_CARE_PROVIDER_SITE_OTHER): Payer: 59 | Admitting: Psychiatry

## 2012-04-26 DIAGNOSIS — F319 Bipolar disorder, unspecified: Secondary | ICD-10-CM

## 2012-04-27 ENCOUNTER — Ambulatory Visit (HOSPITAL_COMMUNITY): Payer: Self-pay | Admitting: Psychology

## 2012-04-27 NOTE — Patient Instructions (Signed)
Discussed orally 

## 2012-04-27 NOTE — Progress Notes (Signed)
Patient:  Kristin Stephens   DOB: Jul 12, 1985  MR Number: [id number]  [facility]: [facility]:  [address] [place],  [place], [address]  Start: Monday 04/26/2012 10:00 AM End: Monday 04/26/2012 10:45 AM  Provider/Observer:     Florencia Reasons, MSW, LCSW   Chief Complaint:      Chief Complaint  Patient presents with  . Anxiety    Reason For Service:     The patient is a returning patient to this practice and reports increased symptoms of anxiety and depression. She has a history of bipolar disorder. Patient is seen today for follow up appointment.  Interventions Strategy:  Supportive therapy,cognitive therapy  Participation Level:   Active  Participation Quality:  Appropriate    Behavioral Observation:  Casual, Alert,  anxious,   Current Psychosocial Factors:  Patient's 56-year-old daughter had a seizure last week. Patient is scheduled for surgery 05/31/2012. Patient is adjusting to resuming school.  Content of Session:   Reviewing symptoms, processing feelings, reinforcing patient's efforts  to improve self-care and maintain consistency, identifying relaxation and coping techniques  Current Status:   The patient reports increased anxiety and worry.   Patient Progress:   Fair. The patient reports increased stress and anxiety in the past week. Her daughter recently had another seizure. Her daughter is scheduled to see a specialist this week. Patient also reports increased nervousness about school and her performance as she has started taking tests. She is scheduled for psychological testing tomorrow with Dr Kieth Brightly as part of the process to see if she qualifies for any special accommodations for test taking and assignments in school. Therapist works with patient to review relaxation techniques and to identify coping statements.  Target Goals:   1. Increase understanding of mental disorder and improve self-care ; 1: 1 psychoeducation and cognitive behavior therapy, one time  every 1-4 weeks     2. Improve communication skills (decrease yelling) and ability to identify and verbalize feelings; 1-1 psychotherapy ( supportive and cognitive therapy) one time every 1-4 weeks    3. Decrease anxiety and improve relaxation/coping techniques; 1-1 psychotherapy (supportive and cognitive behavioral therapy) one time every 1-4 weeks    4. Increase positive thought patterns and decreased negative thought patterns; 1:1 psychotherapy (supportive and cognitive therapy) one time every 1-4 weeks  Last Reviewed:   01/19/2012  Goals Addressed Today:    Goal 3   Impression/Diagnosis:   The patient has a long-standing history of symptoms of anxiety and depression along with mood swings beginning in early adolescence. Symptoms have worsened since the birth of her daughter in January 2011. Current symptoms now include anxiety, depressed mood, ruminating thoughts, irritability, mood swings, and crying spells. Diagnosis: bipolar 1 disorder  Diagnosis:  Axis I:  1. Bipolar 1 disorder             Axis II: Deferred

## 2012-05-01 DIAGNOSIS — J342 Deviated nasal septum: Secondary | ICD-10-CM

## 2012-05-01 DIAGNOSIS — J343 Hypertrophy of nasal turbinates: Secondary | ICD-10-CM

## 2012-05-01 HISTORY — DX: Deviated nasal septum: J34.2

## 2012-05-01 HISTORY — DX: Hypertrophy of nasal turbinates: J34.3

## 2012-05-07 ENCOUNTER — Ambulatory Visit (INDEPENDENT_AMBULATORY_CARE_PROVIDER_SITE_OTHER): Payer: 59 | Admitting: Psychiatry

## 2012-05-07 ENCOUNTER — Encounter (HOSPITAL_COMMUNITY): Payer: Self-pay | Admitting: Psychiatry

## 2012-05-07 VITALS — Wt 214.2 lb

## 2012-05-07 DIAGNOSIS — F39 Unspecified mood [affective] disorder: Secondary | ICD-10-CM

## 2012-05-07 DIAGNOSIS — F319 Bipolar disorder, unspecified: Secondary | ICD-10-CM

## 2012-05-07 DIAGNOSIS — F988 Other specified behavioral and emotional disorders with onset usually occurring in childhood and adolescence: Secondary | ICD-10-CM

## 2012-05-07 DIAGNOSIS — F338 Other recurrent depressive disorders: Secondary | ICD-10-CM

## 2012-05-07 MED ORDER — LAMOTRIGINE 100 MG PO TABS: 100.0000 mg | ORAL_TABLET | Freq: Every day | ORAL | Status: DC

## 2012-05-07 MED ORDER — CITALOPRAM HYDROBROMIDE 40 MG PO TABS: 40.0000 mg | ORAL_TABLET | Freq: Every day | ORAL | Status: DC

## 2012-05-07 NOTE — Patient Instructions (Signed)
Keep the increased exercise up.  7 pound weight loss is AWESOME  Call if problems or concerns.

## 2012-05-07 NOTE — Progress Notes (Signed)
[facility] [address] Progress Note DESIRA ALESSANDRINI MRN: [id number] DOB: Feb 12, 1986 Age: 27 y.o.  Date: 05/07/2012 Start Time: 9:05 AM End Time: 9:28 AM  Chief Complaint: Chief Complaint  Patient presents with  . ADHD  . Follow-up  . Medication Refill   Subjective: "I'm doing better on the Celexa and better on 40 mg a day". Depression 0/10 and Anxiety 3/10, where 1 is the best and 10 is the worst.  Focus is adequately controlled with Adderall when she is in school.   History of present illness Patient is 27 year old Caucasian female who came for her followup appointment.  Pt reports that she is compliant with the psychotropic medications with good benefit and some side effects.  Her side effects are primarily interactions with antibiotics.  She especially likes the light that she uses for her seasonal affective disorder.  It is more stable now.    Current psychiatric medication Lamictal 100 mg daily Celexa 40 mg daily Adderall 10 mg daily however is prescribed 20 mg , she takes only when she needed prescribed by primary care physician Xanax from nurse practitioner. Only as needed and less now on Celexa  Past psychiatric history Patient has been seeing in this office since April 2012 by Dr Lolly Mustache however she has been seeing therapist in this office for more than 10 years.  Patient endorse history of mood swing, depression, anger and poor attention and concentration.  Patient also endorse history of self abusive behavior and endorse cutting herself with a razor blade when her grandmother was dying.  In the past she was prescribed Wellbutrin but she was admitted on medical for the she never took the Wellbutrin.  Patient denies any history of suicidal attempt or any inpatient psychiatric treatment.  She had history of burst of energy with excessive shopping, cleaning, road rage and impulse eating.   Psychosocial history Patient was raised by her mother and grandmother when her  father left when she was only 53 years old.  Patient told her father was never in her life and she was very close to her grandmother.  Patient has a lot of resentment and anger towards her father.  Patient grandmother died when she was only 27 years old.  Patient has one daughter.  Patient has a very good relationship with her boyfriend whose been very supportive.  Family history family history includes ADD / ADHD in her brothers and sister; Alcohol abuse in her father; Anxiety disorder in her brother; Bipolar disorder in her father and paternal grandmother; Dementia in her maternal grandmother and paternal grandmother; Depression in her mother; Emphysema in her paternal grandmother; Heart disease in her maternal grandmother and paternal grandmother; Hyperlipidemia in her mother; Hypertension in her mother; Personality disorder in her father; Physical abuse in her mother; Prostate cancer in her maternal grandfather; Seizures in her daughter; Sexual abuse in her mother; and Stroke in her maternal grandfather.  There is no history of Drug abuse, and OCD, and Paranoid behavior, and Schizophrenia, .  Alcohol and substance use history Patient endorse history of using marijuana and drugs and alcohol in her teens when she was self-medicating for her depression and anxiety.  However she claims to be sober since then.  Patient denies any history of DWI, tremors or blackout.  Education and work history Patient has GED and currently she is enrolled in [employer].   She is hoping to finish her schooling in 2015.  Patient has a history of attention and concentration problem  which was formally tested by psychologist in this office.    Medical history Patient has history of headache, GERD, allergic rhinitis, hypertension, irritable bowel syndrome and aortic Valve repaired.    Mental status examination Patient is well groomed and casually dressed. She is calm and cooperative.  She described her mood is better  and her affect is improved from the past   She denies any active or passive suicidal thoughts or homicidal thoughts.  Her speech is fast but clear and coherent.  Her thought process is logical linear and goal-directed.  There no psychotic symptoms present at this time.  Her attention and concentration this could.  She's oriented x3.  Her insight judgment and impulse control is okay.  Lab Results:  Recent Results (from the past 8736 hour(s))  BASIC METABOLIC PANEL      Component Value Range   Sodium 139  137 - 147 mmol/L   Potassium 4.2     Chloride 103     CO2 28     Glucose 90     BUN 12  4 - 21 mg/dL   Creat 1.61     Calcium 9.6     Total Bilirubin 0.3     Bilirubin, Direct 0.1  0.01 - 0.4 mg/dL   Bilirubin, Indirect 0.2     Alkaline Phosphatase 81     AST 15     ALT 15  7 - 35 U/L   Lipase 19  0 - 53 units/L  CBC WITH DIFFERENTIAL      Component Value Range   WBC 5.7     Hemoglobin 12.9  12.0 - 16.0 g/dL   HCT 38     platelet count 342    CBC WITH DIFFERENTIAL   Collection Time   08/28/11  9:43 AM      Component Value Range   WBC 6.3  4.0 - 10.5 K/uL   RBC 4.63  3.87 - 5.11 MIL/uL   Hemoglobin 13.7  12.0 - 15.0 g/dL   HCT 09.6  04.5 - 40.9 %   MCV 86.8  78.0 - 100.0 fL   MCH 29.6  26.0 - 34.0 pg   MCHC 34.1  30.0 - 36.0 g/dL   RDW 81.1  91.4 - 78.2 %   Platelets 332  150 - 400 K/uL   Neutrophils Relative 63  43 - 77 %   Neutro Abs 4.0  1.7 - 7.7 K/uL   Lymphocytes Relative 30  12 - 46 %   Lymphs Abs 1.9  0.7 - 4.0 K/uL   Monocytes Relative 7  3 - 12 %   Monocytes Absolute 0.4  0.1 - 1.0 K/uL   Eosinophils Relative 0  0 - 5 %   Eosinophils Absolute 0.0  0.0 - 0.7 K/uL   Basophils Relative 0  0 - 1 %   Basophils Absolute 0.0  0.0 - 0.1 K/uL   Smear Review Criteria for review not met    COMPREHENSIVE METABOLIC PANEL   Collection Time   08/28/11  9:43 AM      Component Value Range   Sodium 138  135 - 145 mEq/L   Potassium 4.5  3.5 - 5.3 mEq/L   Chloride 103  96 -  112 mEq/L   CO2 28  19 - 32 mEq/L   Glucose, Bld 74  70 - 99 mg/dL   BUN 9  6 - 23 mg/dL   Creat 9.56  2.13 - 0.86 mg/dL   Total  Bilirubin 0.4  0.3 - 1.2 mg/dL   Alkaline Phosphatase 73  39 - 117 U/L   AST 13  0 - 37 U/L   ALT 17  0 - 35 U/L   Total Protein 6.8  6.0 - 8.3 g/dL   Albumin 4.0  3.5 - 5.2 g/dL   Calcium 9.3  8.4 - 16.1 mg/dL  HEMOGLOBIN W9U   Collection Time   08/28/11  9:43 AM      Component Value Range   Hemoglobin A1C 5.0  <5.7 %   Mean Plasma Glucose 97  <117 mg/dL  CBC WITH DIFFERENTIAL   Collection Time   11/11/11 12:16 AM      Component Value Range   WBC 8.2  4.0 - 10.5 K/uL   RBC 4.62  3.87 - 5.11 MIL/uL   Hemoglobin 13.4  12.0 - 15.0 g/dL   HCT 04.5  40.9 - 81.1 %   MCV 86.8  78.0 - 100.0 fL   MCH 29.0  26.0 - 34.0 pg   MCHC 33.4  30.0 - 36.0 g/dL   RDW 91.4  78.2 - 95.6 %   Platelets 330  150 - 400 K/uL   Neutrophils Relative 70  43 - 77 %   Neutro Abs 5.8  1.7 - 7.7 K/uL   Lymphocytes Relative 23  12 - 46 %   Lymphs Abs 1.9  0.7 - 4.0 K/uL   Monocytes Relative 7  3 - 12 %   Monocytes Absolute 0.5  0.1 - 1.0 K/uL   Eosinophils Relative 0  0 - 5 %   Eosinophils Absolute 0.0  0.0 - 0.7 K/uL   Basophils Relative 0  0 - 1 %   Basophils Absolute 0.0  0.0 - 0.1 K/uL  COMPREHENSIVE METABOLIC PANEL   Collection Time   11/11/11 12:16 AM      Component Value Range   Sodium 139  135 - 145 mEq/L   Potassium 3.5  3.5 - 5.1 mEq/L   Chloride 103  96 - 112 mEq/L   CO2 27  19 - 32 mEq/L   Glucose, Bld 87  70 - 99 mg/dL   BUN 10  6 - 23 mg/dL   Creatinine, Ser 2.13  0.50 - 1.10 mg/dL   Calcium 9.9  8.4 - 08.6 mg/dL   Total Protein 7.8  6.0 - 8.3 g/dL   Albumin 4.0  3.5 - 5.2 g/dL   AST 14  0 - 37 U/L   ALT 13  0 - 35 U/L   Alkaline Phosphatase 86  39 - 117 U/L   Total Bilirubin 0.4  0.3 - 1.2 mg/dL   GFR calc non Af Amer >90  >90 mL/min   GFR calc Af Amer >90  >90 mL/min  LIPASE, BLOOD   Collection Time   11/11/11 12:16 AM      Component Value Range    Lipase 29  11 - 59 U/L  PREGNANCY, URINE   Collection Time   11/18/11  3:19 PM      Component Value Range   Preg Test, Ur NEG    Family doctor draws these  Assessment Axis I Mood disorder NOS, Seasonal Affective Disorder, ADD by history.  Axis II deferred Axis III see medical history Axis IV mild to moderate Axis V 60-65  Plan: I took her vitals.  I reviewed CC, tobacco/med/surg Hx, meds effects/ side effects, problem list, therapies and responses as well as current situation/symptoms discussed options. See orders  and pt instructions for more details.  Medical Decision Making Problem Points:  Established problem, stable/improving (1), Review of last therapy session (1) and Review of psycho-social stressors (1) Data Points:  Review or order clinical lab tests (1) Review of medication regiment & side effects (2)  I certify that outpatient services furnished can reasonably be expected to improve the patient's condition.   Orson Aloe, MD, [facility]

## 2012-05-12 ENCOUNTER — Encounter (HOSPITAL_COMMUNITY): Payer: Self-pay | Admitting: Psychology

## 2012-05-12 ENCOUNTER — Ambulatory Visit (INDEPENDENT_AMBULATORY_CARE_PROVIDER_SITE_OTHER): Payer: 59 | Admitting: Psychology

## 2012-05-12 DIAGNOSIS — F39 Unspecified mood [affective] disorder: Secondary | ICD-10-CM

## 2012-05-12 DIAGNOSIS — F909 Attention-deficit hyperactivity disorder, unspecified type: Secondary | ICD-10-CM

## 2012-05-12 NOTE — Progress Notes (Addendum)
The patient was administered the Comprehensive Attention Battery and the CAB CPT measures. The patient appeared to fully participate in these testing procedures and this does appear to be a fair and valid sample of her current attentional abilities as well as various aspects of executive functioning. Below are the results of this broad and comprehensive assessment of attention/concentration and executive functioning.  Initially, the patient was administered the auditory/visual reaction time test. These two measures are both pure reaction time measures and are administered in both the visual and auditory modalities. On the visual pure reaction time test, the patient accurately responded to 47 of the 50 targets, which is within normal limits. her average response time was 352 ms which is also within normal limits. The patient was administered the auditory pure reaction time test and she correctly responded to 49 of 50 targets, which is an inpatient performance and within normal limits. her average response time was 443 ms, which is within normal limits.  The patient was then administered the discriminant reaction time test. she was administered the visual, auditory, and mixed subtests. On the visual discriminate reaction time measure, she correctly responded to 35 of 35 targets and had to errors of commission and 0 errors of omission. This is is an efficient performance and represents a performance that is within normative expectations. her average response time for correctly responded to items was 416 ms which is within normal limits. The patient was then administered the auditory discriminate reaction time measure. she correctly responded to 35 of 35 targets, which is efficient and within normal limits. her average response time was 719 ms, which is within normative expectations. The patient was then administered the mixed discriminate reaction time, which require shifting from between either auditory or visual  targets with an alteration between auditory and visual stimuli. This measure require shifting attention on top of discriminate identification and responding.  The patient correctly responded to 29 of the 30 targets and had 5 errors of commission and 1 errors of omission. This is an efficient score for accuracy.  her average response time for correct responses was 770 ms.  This performance is within  normal limits and represents the ability to effectively shift between targets and show affective shifting factor of attention.  The patient was administered the auditory/visual scan reaction time test. On the visual measure the patient correctly responded to 39 of 40 targets and the average response time was within normal limits. The auditory measure resulted in the correct response to 40 of 40 targets with 0 errors of commission and 0 error of omission. her average response times were within normal limits. The patient was then administered the mixed auditory visual scan measure and she correctly responded to 40 of 40 targets, which is within normal limits and her response times were within normal limits.  The patient was then administered the auditory/visual encoding test. On the auditory forwards the patient's performance was within normal limits.  On the auditory backwards measures the patient's performance was lower than her typical performance was but was still within normal limits and less than standard deviation below normative values.  This pattern suggests adequate functioning with regard to auditory encoding. On the visual encoding forward measure the patient produced performance that was within normal limits.  On the visual backwards measures the patient's performance was quite efficient and at the upper end of normal limits.  Overall, this pattern suggests that auditory encoding is within normal limits and visual encoding is relative strength  for her and clearly within.  The patient was then  administered the Stroop interference cancellation test. This task is broken down into eight separate trials. On the first four trials the patient is presented with a focus execute task that requires the patient to scan a 36 grid layout in which the words red green or blue were randomly printed in each grid. Each of these color words and be printed in either red green or blue color. On half of them, the word matches the color of the font and it is these that the patient is to identify where the color and word match. After the first four trials of this visual scanning measure change to four trials that include a Stroop interference component inwhich the words red green and blue are played randomly over the speakers. On the first four "noninterference" trials the patient produced performances on these focus execute task that initially were below normative expectations on the first 2 trials. She was nearly 1/2 standard deviation below normative expectations with regard to basic focus execute task. However, by the third and fourth noninterference trial or performances were clearly within normal limits.  she correctly identified between 7 and 12 items on each of these trials. On the next four interference trials, the patient's performance showed generally stable performance although it did deteriorate towards the end of the measure and the last interference trial was more than a standard deviation below normative expectations. This pattern does suggest some difficulties with sustaining attention with distraction is present.  The patient was then administered the CAB CPT visual monitor measure, which is a 15 minute long visual continuous performance measure.  This measure is broken down into five 3-minute blocks of time for analysis. The patient is presented with either the color red green or blue every 2 seconds and every time the color red is presented the patient is to respond. On the first 3 min. Block of time the  patient correctly identified 27 of 30 targets with 0 error of commission and 3 errors of omission. her average response time was 473 ms. This performance showed considerable variability with regard to errors of omission. The patient produced 8 errors of omission and the second 3 minute blocks of time and had 5 errors in of omission at the last 3 minutes block of time. Her response times actually improved as a function of time. For example during the first 3 minutes of time she took an average of 473 ms and by the last 3 minutes of time she averaged 350 ms. This pattern shows a level of increased errors of omission and significant difficulty maintaining focus as a function of time. Previous measures also show deterioration of focus and execution in as little as 3 minutes of sustained performance. However, her average response times for correctly identified items actually showed an increase in efficiency with regard to response times. This pattern is relatively unusual and likely to fix an individual who is trying very hard but still having difficulty sustaining attention in performance and having lapses of attention.  Overall:  The patient's performance on this broad range of attention/concentration measures and executive functioning measures do suggest some specific attentional deficits. The patient showed difficulty maintaining attention over time and does show some mild to moderate deficits with the factor of sustaining attention. This was identified both on the Stroop interference cancellation test as well as aspects of the CPT measures. However, these deficits in sustained attention appear to have more to do  with lapses of attention rather than effectively slowing down overall processing speed. Other measures such as fundamental aspects like pure reaction time, discriminate reaction time measures, visual encoding and to a lesser degree of auditory encoding were all within normal limits. The patient was able  to divide attention and remain free of distraction for brief periods of time.  When these measures were extended she had increasing problems. While there are not the classic pattern of findings with attention deficit disorder she does clearly show some aspects of the residual attention deficit disorder particularly with the element of sustained attention. However, her history of mood disorder including episodic depression which may be seasonally based and episodes of hypomanic behaviors in the past to raise a concern about appropriate medication interventions. The patient reports that she has responded well to Adderall recently and this may very well help with her ability to sustain attention particularly an academic settings. However, given her history of mood disturbance it is important that we monitor for either depressive or manic episodes developing and caution the patient about use of psychostimulants during these episodes. The patient should also be cautioned about using them during times of high stress and anxiety as psychostimulants may exacerbate these features when they present.  Later, she may talk with her treating physician about a trial on a medication like Wellbutrin to see if it works as well as the current combination of SSIR and psychostimulant.  As far as school goes, the patient does have documented attentional deficits and would clearly benefit for various adjustments to testing situations such as more time taking tests, less distracting testing setting or others adjustments that would help her more clearly express her level of learning and understanding of class material.

## 2012-05-14 ENCOUNTER — Ambulatory Visit (HOSPITAL_COMMUNITY): Payer: Self-pay | Admitting: Psychiatry

## 2012-05-21 ENCOUNTER — Ambulatory Visit (INDEPENDENT_AMBULATORY_CARE_PROVIDER_SITE_OTHER): Payer: 59 | Admitting: Psychology

## 2012-05-21 ENCOUNTER — Encounter (HOSPITAL_COMMUNITY): Payer: Self-pay | Admitting: Psychology

## 2012-05-21 DIAGNOSIS — F909 Attention-deficit hyperactivity disorder, unspecified type: Secondary | ICD-10-CM

## 2012-05-21 DIAGNOSIS — F39 Unspecified mood [affective] disorder: Secondary | ICD-10-CM

## 2012-05-21 NOTE — Progress Notes (Signed)
Provided feedback of neuropsychological testing.  Can be found on 05/12/2012 note with summery found below.  Overall:  The patient's performance on this broad range of attention/concentration measures and executive functioning measures do suggest some specific attentional deficits. The patient showed difficulty maintaining attention over time and does show some mild to moderate deficits with the factor of sustaining attention. This was identified both on the Stroop interference cancellation test as well as aspects of the CPT measures. However, these deficits in sustained attention appear to have more to do with lapses of attention rather than effectively slowing down overall processing speed. Other measures such as fundamental aspects like pure reaction time, discriminate reaction time measures, visual encoding and to a lesser degree of auditory encoding were all within normal limits. The patient was able to divide attention and remain free of distraction for brief periods of time. When these measures were extended she had increasing problems. While there are not the classic pattern of findings with attention deficit disorder she does clearly show some aspects of the residual attention deficit disorder particularly with the element of sustained attention. However, her history of mood disorder including episodic depression which may be seasonally based and episodes of hypomanic behaviors in the past to raise a concern about appropriate medication interventions. The patient reports that she has responded well to Adderall recently and this may very well help with her ability to sustain attention particularly an academic settings. However, given her history of mood disturbance it is important that we monitor for either depressive or manic episodes developing and caution the patient about use of psychostimulants during these episodes. The patient should also be cautioned about using them during times of high stress and  anxiety as psychostimulants may exacerbate these features when they present. Later, she may talk with her treating physician about a trial on a medication like Wellbutrin to see if it works as well as the current combination of SSIR and psychostimulant.  As far as school goes, the patient does have documented attentional deficits and would clearly benefit for various adjustments to testing situations such as more time taking tests, less distracting testing setting or others adjustments that would help her more clearly express her level of learning and understanding of class material.

## 2012-05-24 ENCOUNTER — Ambulatory Visit (INDEPENDENT_AMBULATORY_CARE_PROVIDER_SITE_OTHER): Payer: 59 | Admitting: Psychiatry

## 2012-05-24 ENCOUNTER — Encounter (HOSPITAL_BASED_OUTPATIENT_CLINIC_OR_DEPARTMENT_OTHER): Payer: Self-pay | Admitting: *Deleted

## 2012-05-24 DIAGNOSIS — F319 Bipolar disorder, unspecified: Secondary | ICD-10-CM

## 2012-05-24 NOTE — Progress Notes (Addendum)
Patient:  Kristin Stephens   DOB: 09/17/1985  MR Number: [id number]  [facility]:  [address] [place],  [place], [address]  Start: Monday 05/24/2012 11:10 AM End: Monday 05/24/2012 11:55 AM  Provider/Observer:     Florencia Reasons, MSW, LCSW   Chief Complaint:      Chief Complaint  Patient presents with  . Depression  . Anxiety    Reason For Service:     The patient is a returning patient to this practice and reports increased symptoms of anxiety and depression. She has a history of bipolar disorder. Patient is seen today for follow up appointment.  Interventions Strategy:  Supportive therapy,cognitive therapy  Participation Level:   Active  Participation Quality:  Appropriate    Behavioral Observation:  Casual, Alert,  anxious,   Current Psychosocial Factors: Patient's 21-year-old daughter recently was diagnosed with seizures. Patient is scheduled for surgery 05/31/2012.  Content of Session:   Reviewing symptoms, processing feelings, identifying and challenging cognitive distortions, identifying coping statements, identifying relaxation and coping techniques  Current Status:   The patient reports decreased anxiety and worry.  Patient Progress:   Good. The patient is relieved that her daughter has seen a specialist and been diagnosed. She expresses appropriate concerns about her child and the diagnosis. Patient continues to pressure self about her parenting responsibilities. Patient is scheduled for surgery next week and expresses anxiety about the impact of her recovery time on her daughter. Therapist works with patient to identify ways to use her support system as well as identify and challenge cognitive distortions.  She also expresses some anxiety about her surgery and the effects of the recovery time on patient. Therapist works with patient to review relaxation techniques and to identify coping statements.   Target Goals:   1. Increase understanding of  mental disorder and improve self-care ; 1: 1 psychoeducation and cognitive behavior therapy, one time every 1-4 weeks     2. Improve communication skills (decrease yelling) and ability to identify and verbalize feelings; 1-1 psychotherapy ( supportive and cognitive therapy) one time every 1-4 weeks    3. Decrease anxiety and improve relaxation/coping techniques; 1-1 psychotherapy (supportive and cognitive behavioral therapy) one time every 1-4 weeks    4. Increase positive thought patterns and decreased negative thought patterns; 1:1 psychotherapy (supportive and cognitive therapy) one time every 1-4 weeks  Last Reviewed:   01/19/2012  Goals Addressed Today:    Goals 3 and 4  Impression/Diagnosis:   The patient has a long-standing history of symptoms of anxiety and depression along with mood swings beginning in early adolescence. Symptoms have worsened since the birth of her daughter in January 2011. Current symptoms now include anxiety, depressed mood, ruminating thoughts, irritability, mood swings, and crying spells. Diagnosis: bipolar 1 disorder  Diagnosis:  Axis I:  Bipolar disorder          Axis II: Deferred

## 2012-05-24 NOTE — Patient Instructions (Signed)
Discussed orally 

## 2012-05-25 NOTE — Pre-Procedure Instructions (Signed)
Echo reviewed by Dr. Crews; pt. OK to come for surgery. 

## 2012-05-26 ENCOUNTER — Encounter: Payer: Self-pay | Admitting: Gastroenterology

## 2012-05-26 ENCOUNTER — Ambulatory Visit (INDEPENDENT_AMBULATORY_CARE_PROVIDER_SITE_OTHER): Payer: Medicaid Other | Admitting: Gastroenterology

## 2012-05-26 VITALS — BP 118/70 | HR 104 | Temp 98.2°F | Ht 65.0 in | Wt 216.4 lb

## 2012-05-26 DIAGNOSIS — K219 Gastro-esophageal reflux disease without esophagitis: Secondary | ICD-10-CM

## 2012-05-26 DIAGNOSIS — K589 Irritable bowel syndrome without diarrhea: Secondary | ICD-10-CM

## 2012-05-26 MED ORDER — DEXLANSOPRAZOLE 60 MG PO CPDR: 60.0000 mg | DELAYED_RELEASE_CAPSULE | Freq: Every day | ORAL | Status: DC

## 2012-05-26 NOTE — Progress Notes (Signed)
Subjective:    Patient ID: Kristin Stephens, female    DOB: 09-16-1985, 27 y.o.   MRN: [id number]  PCP:  HPI ONE EPISODE OF BURPING THAT LASTED 2 DAYS V. BEFORE IT WAS WEEKS. TRIGGERS:??, JUST HAPPENED. TOOK MYLICON AND IT CAME IT SPURTS. ATE [facility] PRIOR TO EPISODE. BMs: ONCE A DAY-#3/4 MOST OF THE TIME #2 OCCASIONALLY. DIDN'T GO FOR 23 DAYS AND IT WAS HARD COMING OUT. RARE HAS A #6 STOOL. REFLUX: BETTER. DEXILANT IS A WONDERFUL DRUG. FINALLY GOT APPROVED. WEIGHT LOSS: 6 LBS.  HAS A 3 YO DAUGHTER IN JAN 2014. 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   Past Medical History  Diagnosis Date  . Esophageal reflux   . IBS (irritable bowel syndrome)   . Anxiety   . Depression   . Bipolar affective disorder   . TMJ syndrome   . Asthma     daily and prn inhalers  . Deviated nasal septum 05/2012  . Nasal turbinate hypertrophy 05/2012    bilateral  . Dental crowns present   . Eczema     right leg   Past Surgical History  Procedure Laterality Date  . Asd and vsd repair  1989  . Coarctation of aorta repair  1989  . Cesarean section  04/12/2009  . Esophagogastroduodenoscopy  04/04/2008      Normal esophagus/Mild patchy erythema in the antrum/ Normal duodenal bulb, normal small bowel biopsy  . Flexible sigmoidoscopy  04/04/2008      Small internal hemorrhoids (poor bowel prep)  . Cardiac catheterization  06/18/2006   Allergies  Allergen Reactions  . Sulfonamide Derivatives Swelling    SWELLING OF EYES WITH OPHTHALMIC SULFA  . Coconut Flavor Rash    Current Outpatient Prescriptions  Medication Sig Dispense Refill  . acetaminophen (TYLENOL) 325 MG tablet Take 650 mg by mouth every 6 (six) hours as needed.      Marland Kitchen albuterol (PROVENTIL HFA;VENTOLIN HFA) 108 (90 BASE) MCG/ACT inhaler Inhale 2 puffs into the lungs every 6 (six) hours as needed for wheezing.    Marland Kitchen ALPRAZolam (XANAX) 0.5 MG tablet Take 0.5 mg by mouth at bedtime as needed.    Marland Kitchen  amphetamine-dextroamphetamine (ADDERALL) 20 MG tablet Take 20 mg by mouth daily. Sometimes takes additional 10 mg. in afternoon    . beclomethasone (QVAR) 80 MCG/ACT inhaler Inhale 2 puffs into the lungs 2 (two) times daily.    . citalopram (CELEXA) 40 MG tablet Take 1 tablet (40 mg total) by mouth daily. Take one by mouth daily THREE tabs a day    . dexlansoprazole (DEXILANT) 60 MG capsule Take 60 mg by mouth daily.    Marland Kitchen lamoTRIgine (LAMICTAL) 100 MG tablet Take 1 tablet (100 mg total) by mouth daily.    . medroxyPROGESTERone (DEPO-PROVERA) 150 MG/ML injection Inject 150 mg into the muscle every 3 (three) months.      . simethicone (MYLICON) 125 MG chewable tablet Chew 125 mg by mouth every 6 (six) hours as needed.      Marland Kitchen albuterol (PROVENTIL,VENTOLIN) 90 MCG/ACT inhaler Inhale 2 puffs into the lungs every 6 (six) hours as needed.           Review of Systems     Objective:   Physical Exam  Vitals reviewed. Constitutional: She is oriented to person, place, and time. She appears well-nourished. No distress.  HENT:  Head: Normocephalic and atraumatic.  Mouth/Throat: Oropharynx is clear and moist. No oropharyngeal exudate.  Eyes: Pupils are  equal, round, and reactive to light. No scleral icterus.  Neck: Normal range of motion. Neck supple.  Cardiovascular: Normal rate, regular rhythm and normal heart sounds.   Pulmonary/Chest: Effort normal and breath sounds normal. No respiratory distress.  Abdominal: Soft. Bowel sounds are normal. She exhibits no distension. There is no tenderness.  Musculoskeletal: She exhibits no edema.  Lymphadenopathy:    She has no cervical adenopathy.  Neurological: She is alert and oriented to person, place, and time.  NO FOCAL DEFICITS   Psychiatric: She has a normal mood and affect.          Assessment & Plan:

## 2012-05-26 NOTE — Assessment & Plan Note (Addendum)
SX FAIRLY WELL CONTROLLED.  CONTINUE DEXILANT.  CONTINUE YOUR WEIGHT LOSS EFFORTS.  FOLLOW A LOW FAT DIET.   FOLLOW UP IN 6 MOS.

## 2012-05-26 NOTE — Assessment & Plan Note (Addendum)
SX FAIRLY WELL CONTROLLED.  CONTINUE A PROBIOTIC DAILY.  FOLLOW A LOW FAT DIET.  FOLLOW UP IN 6 MOS.

## 2012-05-26 NOTE — Progress Notes (Signed)
Faxed to PCP

## 2012-05-26 NOTE — Patient Instructions (Signed)
CONTINUE DEXILANT.   CONTINUE A PROBIOTIC DAILY.   CONTINUE YOUR WEIGHT LOSS EFFORTS.   FOLLOW A LOW FAT DIET.    FOLLOW UP IN 6 MOS.

## 2012-05-28 ENCOUNTER — Ambulatory Visit: Payer: Self-pay | Admitting: Pulmonary Disease

## 2012-05-28 NOTE — Progress Notes (Signed)
Reminder in epic to follow up in 6 months °

## 2012-05-31 ENCOUNTER — Ambulatory Visit (HOSPITAL_BASED_OUTPATIENT_CLINIC_OR_DEPARTMENT_OTHER): Payer: Medicaid Other | Admitting: Anesthesiology

## 2012-05-31 ENCOUNTER — Encounter (HOSPITAL_BASED_OUTPATIENT_CLINIC_OR_DEPARTMENT_OTHER): Payer: Self-pay | Admitting: Anesthesiology

## 2012-05-31 ENCOUNTER — Encounter (HOSPITAL_BASED_OUTPATIENT_CLINIC_OR_DEPARTMENT_OTHER)
Admission: RE | Disposition: A | Discharge status: Home / Self Care | Payer: Self-pay | Source: Ambulatory Visit | Attending: Otolaryngology

## 2012-05-31 ENCOUNTER — Encounter (HOSPITAL_BASED_OUTPATIENT_CLINIC_OR_DEPARTMENT_OTHER): Payer: Self-pay | Admitting: *Deleted

## 2012-05-31 ENCOUNTER — Ambulatory Visit (HOSPITAL_BASED_OUTPATIENT_CLINIC_OR_DEPARTMENT_OTHER)
Admission: RE | Admit: 2012-05-31 | Discharge: 2012-05-31 | Disposition: A | Discharge status: Home / Self Care | Payer: Medicaid Other | Source: Ambulatory Visit | Attending: Otolaryngology | Admitting: Otolaryngology

## 2012-05-31 DIAGNOSIS — Z6835 Body mass index (BMI) 35.0-35.9, adult: Secondary | ICD-10-CM | POA: Insufficient documentation

## 2012-05-31 DIAGNOSIS — J45909 Unspecified asthma, uncomplicated: Secondary | ICD-10-CM | POA: Insufficient documentation

## 2012-05-31 DIAGNOSIS — J3489 Other specified disorders of nose and nasal sinuses: Secondary | ICD-10-CM | POA: Insufficient documentation

## 2012-05-31 DIAGNOSIS — K219 Gastro-esophageal reflux disease without esophagitis: Secondary | ICD-10-CM | POA: Insufficient documentation

## 2012-05-31 DIAGNOSIS — I10 Essential (primary) hypertension: Secondary | ICD-10-CM | POA: Insufficient documentation

## 2012-05-31 DIAGNOSIS — F319 Bipolar disorder, unspecified: Secondary | ICD-10-CM | POA: Insufficient documentation

## 2012-05-31 DIAGNOSIS — F411 Generalized anxiety disorder: Secondary | ICD-10-CM | POA: Insufficient documentation

## 2012-05-31 DIAGNOSIS — Q249 Congenital malformation of heart, unspecified: Secondary | ICD-10-CM | POA: Insufficient documentation

## 2012-05-31 DIAGNOSIS — E669 Obesity, unspecified: Secondary | ICD-10-CM | POA: Insufficient documentation

## 2012-05-31 DIAGNOSIS — K589 Irritable bowel syndrome without diarrhea: Secondary | ICD-10-CM | POA: Insufficient documentation

## 2012-05-31 DIAGNOSIS — J343 Hypertrophy of nasal turbinates: Secondary | ICD-10-CM

## 2012-05-31 DIAGNOSIS — J342 Deviated nasal septum: Secondary | ICD-10-CM | POA: Insufficient documentation

## 2012-05-31 DIAGNOSIS — Z882 Allergy status to sulfonamides status: Secondary | ICD-10-CM | POA: Insufficient documentation

## 2012-05-31 DIAGNOSIS — Z9889 Other specified postprocedural states: Secondary | ICD-10-CM

## 2012-05-31 DIAGNOSIS — Z87891 Personal history of nicotine dependence: Secondary | ICD-10-CM | POA: Insufficient documentation

## 2012-05-31 HISTORY — DX: Dermatitis, unspecified: L30.9

## 2012-05-31 HISTORY — DX: Dental restoration status: Z98.811

## 2012-05-31 HISTORY — DX: Hypertrophy of nasal turbinates: J34.3

## 2012-05-31 HISTORY — DX: Deviated nasal septum: J34.2

## 2012-05-31 HISTORY — DX: Arthralgia of temporomandibular joint, unspecified side: M26.629

## 2012-05-31 HISTORY — PX: NASAL SEPTOPLASTY W/ TURBINOPLASTY: SHX2070

## 2012-05-31 SURGERY — SEPTOPLASTY, NOSE, WITH NASAL TURBINATE REDUCTION
Anesthesia: General | Site: Nose | Laterality: Bilateral | Wound class: Clean Contaminated

## 2012-05-31 MED ORDER — PROMETHAZINE HCL 25 MG/ML IJ SOLN: 6.2500 mg | INTRAMUSCULAR | Status: DC | PRN

## 2012-05-31 MED ORDER — ONDANSETRON HCL 4 MG/2ML IJ SOLN
INTRAMUSCULAR | Status: DC | PRN
  Administered 2012-05-31: 4 mg via INTRAVENOUS

## 2012-05-31 MED ORDER — HYDROMORPHONE HCL PF 1 MG/ML IJ SOLN
0.2500 mg | INTRAMUSCULAR | Status: DC | PRN
  Administered 2012-05-31 (×4): 0.5 mg via INTRAVENOUS

## 2012-05-31 MED ORDER — PROMETHAZINE HCL 25 MG RE SUPP: 25.0000 mg | Freq: Four times a day (QID) | RECTAL | Status: DC | PRN

## 2012-05-31 MED ORDER — OXYCODONE-ACETAMINOPHEN 5-325 MG PO TABS: 1.0000 | ORAL_TABLET | ORAL | Status: DC | PRN

## 2012-05-31 MED ORDER — FENTANYL CITRATE 0.05 MG/ML IJ SOLN: 50.0000 ug | INTRAMUSCULAR | Status: DC | PRN

## 2012-05-31 MED ORDER — PROPOFOL 10 MG/ML IV BOLUS
INTRAVENOUS | Status: DC | PRN
  Administered 2012-05-31: 180 mg via INTRAVENOUS

## 2012-05-31 MED ORDER — MIDAZOLAM HCL 2 MG/ML PO SYRP: 12.0000 mg | ORAL_SOLUTION | Freq: Once | ORAL | Status: DC | PRN

## 2012-05-31 MED ORDER — CEFAZOLIN SODIUM-DEXTROSE 2-3 GM-% IV SOLR
INTRAVENOUS | Status: DC | PRN
  Administered 2012-05-31: 2 g via INTRAVENOUS

## 2012-05-31 MED ORDER — DEXAMETHASONE SODIUM PHOSPHATE 4 MG/ML IJ SOLN
INTRAMUSCULAR | Status: DC | PRN
  Administered 2012-05-31: 10 mg via INTRAVENOUS

## 2012-05-31 MED ORDER — OXYCODONE HCL 5 MG/5ML PO SOLN: 5.0000 mg | Freq: Once | ORAL | Status: AC | PRN

## 2012-05-31 MED ORDER — FENTANYL CITRATE 0.05 MG/ML IJ SOLN
INTRAMUSCULAR | Status: DC | PRN
  Administered 2012-05-31: 50 ug via INTRAVENOUS
  Administered 2012-05-31: 100 ug via INTRAVENOUS

## 2012-05-31 MED ORDER — LACTATED RINGERS IV SOLN
INTRAVENOUS | Status: DC
  Administered 2012-05-31 (×2): via INTRAVENOUS

## 2012-05-31 MED ORDER — OXYCODONE HCL 5 MG PO TABS
5.0000 mg | ORAL_TABLET | Freq: Once | ORAL | Status: AC | PRN
  Administered 2012-05-31: 5 mg via ORAL

## 2012-05-31 MED ORDER — AMOXICILLIN 875 MG PO TABS: 875.0000 mg | ORAL_TABLET | Freq: Two times a day (BID) | ORAL | Status: AC

## 2012-05-31 MED ORDER — LIDOCAINE HCL (CARDIAC) 20 MG/ML IV SOLN
INTRAVENOUS | Status: DC | PRN
  Administered 2012-05-31: 80 mg via INTRAVENOUS

## 2012-05-31 MED ORDER — MIDAZOLAM HCL 2 MG/2ML IJ SOLN: 1.0000 mg | INTRAMUSCULAR | Status: DC | PRN

## 2012-05-31 MED ORDER — SUCCINYLCHOLINE CHLORIDE 20 MG/ML IJ SOLN
INTRAMUSCULAR | Status: DC | PRN
  Administered 2012-05-31: 120 mg via INTRAVENOUS

## 2012-05-31 MED ORDER — MIDAZOLAM HCL 5 MG/5ML IJ SOLN
INTRAMUSCULAR | Status: DC | PRN
  Administered 2012-05-31: 2 mg via INTRAVENOUS

## 2012-05-31 MED ORDER — OXYMETAZOLINE HCL 0.05 % NA SOLN
NASAL | Status: DC | PRN
  Administered 2012-05-31: 1 via NASAL

## 2012-05-31 MED ORDER — MEPERIDINE HCL 25 MG/ML IJ SOLN: 6.2500 mg | INTRAMUSCULAR | Status: DC | PRN

## 2012-05-31 MED ORDER — MUPIROCIN 2 % EX OINT
TOPICAL_OINTMENT | CUTANEOUS | Status: DC | PRN
  Administered 2012-05-31: 1 via NASAL

## 2012-05-31 SURGICAL SUPPLY — 36 items
ATTRACTOMAT 16X20 MAGNETIC DRP (DRAPES) IMPLANT
BLADE SURG 15 STRL LF DISP TIS (BLADE) IMPLANT
BLADE SURG 15 STRL SS (BLADE)
CANISTER SUCTION 1200CC (MISCELLANEOUS) ×2 IMPLANT
CLOTH BEACON ORANGE TIMEOUT ST (SAFETY) ×2 IMPLANT
COAGULATOR SUCT 8FR VV (MISCELLANEOUS) ×2 IMPLANT
DECANTER SPIKE VIAL GLASS SM (MISCELLANEOUS) IMPLANT
DRSG NASOPORE 8CM (GAUZE/BANDAGES/DRESSINGS) IMPLANT
DRSG TELFA 3X8 NADH (GAUZE/BANDAGES/DRESSINGS) IMPLANT
ELECT COATED BLADE 2.86 ST (ELECTRODE) ×2 IMPLANT
ELECT REM PT RETURN 9FT ADLT (ELECTROSURGICAL) ×2
ELECTRODE REM PT RTRN 9FT ADLT (ELECTROSURGICAL) ×1 IMPLANT
GLOVE BIO SURGEON STRL SZ 6.5 (GLOVE) ×2 IMPLANT
GLOVE BIO SURGEON STRL SZ7 (GLOVE) ×2 IMPLANT
GLOVE BIO SURGEON STRL SZ7.5 (GLOVE) ×2 IMPLANT
GOWN PREVENTION PLUS XLARGE (GOWN DISPOSABLE) ×6 IMPLANT
NEEDLE HYPO 25X1 1.5 SAFETY (NEEDLE) ×2 IMPLANT
NS IRRIG 1000ML POUR BTL (IV SOLUTION) ×2 IMPLANT
PACK BASIN DAY SURGERY FS (CUSTOM PROCEDURE TRAY) ×2 IMPLANT
PACK ENT DAY SURGERY (CUSTOM PROCEDURE TRAY) ×2 IMPLANT
PENCIL BUTTON HOLSTER BLD 10FT (ELECTRODE) ×2 IMPLANT
SLEEVE SCD COMPRESS KNEE MED (MISCELLANEOUS) ×2 IMPLANT
SOLUTION BUTLER CLEAR DIP (MISCELLANEOUS) ×2 IMPLANT
SPLINT NASAL DOYLE BI-VL (GAUZE/BANDAGES/DRESSINGS) ×2 IMPLANT
SPONGE GAUZE 2X2 8PLY STRL LF (GAUZE/BANDAGES/DRESSINGS) ×2 IMPLANT
SPONGE NEURO XRAY DETECT 1X3 (DISPOSABLE) ×2 IMPLANT
SUT CHROMIC 4 0 P 3 18 (SUTURE) ×2 IMPLANT
SUT PLAIN 4 0 ~~LOC~~ 1 (SUTURE) ×2 IMPLANT
SUT PROLENE 3 0 PS 2 (SUTURE) ×2 IMPLANT
SUT VIC AB 4-0 P-3 18XBRD (SUTURE) IMPLANT
SUT VIC AB 4-0 P3 18 (SUTURE)
TOWEL OR 17X24 6PK STRL BLUE (TOWEL DISPOSABLE) ×2 IMPLANT
TUBE SALEM SUMP 12R W/ARV (TUBING) IMPLANT
TUBE SALEM SUMP 16 FR W/ARV (TUBING) ×2 IMPLANT
WATER STERILE IRR 1000ML POUR (IV SOLUTION) IMPLANT
YANKAUER SUCT BULB TIP NO VENT (SUCTIONS) ×2 IMPLANT

## 2012-05-31 NOTE — H&P (Signed)
Cc: Chronic nasal congestion  HPI: The patient is here for evaluation of chronic nasal congestion secondary to daily Afrin use.  The patient has been using Afrin for over 8 years.  She is trying to wean herself off of the medication.  She was recently placed on a 6 day course of prednisone and Flonase.  The patient noted some relief while on the prednisone but as soon as the pak was completed, her congestion returned and she began using the Afrin again.  The patient has a history of asthma.  No history of chronic sinusitis is noted. She also denies ant previous ENT surgery.   The patient's review of systems (constitutional, eyes, ENT, cardiovascular, respiratory, GI, musculoskeletal, skin, neurologic, psychiatric, endocrine, hematologic, allergic) is noted in the ROS questionnaire.  It is reviewed with the patient.    Past Medical History (Major events, hospitalizations, surgeries):  Aorta repair/open heart surgery as a newborn, Cesarean section, Cardiac catherater.     Known allergies: Sulfa.     Ongoing medical problems: Asthma, ADHD, Anxiety/depression, Bipolar, Hypertension, IBS, Allergic rhinitis, GERD, Congenital heart disease.     Family medical history: Hypertension, High Cholesterol, Thyroid disease.     Social history: The patient is single. She is a former smoker. She drinks alcohol a few times a month. She denies the use of illegal drugs.   Exam: General: Communicates without difficulty, well nourished, no acute distress.  Head: Normocephalic, no evidence injury, no tenderness, facial buttresses intact without stepoff.  Eyes: PERRL, EOMI. No scleral icterus, conjunctivae clear.  Neuro: CN II exam reveals vision grossly intact.  No nystagmus at any point of gaze.  Ears: Auricles well formed without lesions.  Ear canals are intact without mass or lesion.  No erythema or edema is appreciated.  The TMs are intact without fluid.  Nose: External evaluation reveals normal support and skin  without lesions.  Dorsum is intact.  Anterior rhinoscopy reveals congested mucosa over anterior aspect of the inferior turbinates and nasal septum.  No purulence is noted. Leftward septal deviation is noted.  Oral:  Oral cavity and oropharynx are intact, symmetric, without erythema or edema.  Mucosa is moist without lesions.  Neck: Full range of motion without pain.  There is no significant lymphadenopathy.  No masses palpable.  Thyroid bed within normal limits to palpation.  Parotid glands and submandibular glands equal bilaterally without mass.  Trachea is midline.  Neuro:  CN 2-12 grossly intact. Gait normal.  Vestibular: No nystagmus at any point of gaze.    A: 1.  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  Mucosal congestion is noted on today's exam. 2.  Left septal deviation.  No purulent drainage, polyps, or other suspicious mass or lesion is noted on today's nasal endoscopy.  P: The patient continues to be symptomatic despite extensive medical treatment.  I would recommend proceeding with bilateral turbinate reduction and septoplasty to improve her nasal airway.  The risks and benefits are discussed at length with the patient.  She would like to proceed with the procedure.

## 2012-05-31 NOTE — Anesthesia Postprocedure Evaluation (Signed)
  Anesthesia Post-op Note  Patient: Kristin Stephens  Procedure(s) Performed: Procedure(s): NASAL SEPTOPLASTY WITH TURBINATE REDUCTION (Bilateral)  Patient Location: PACU  Anesthesia Type:General  Level of Consciousness: awake  Airway and Oxygen Therapy: Patient Spontanous Breathing  Post-op Pain: mild  Post-op Assessment: Post-op Vital signs reviewed  Post-op Vital Signs: stable  Complications: No apparent anesthesia complications

## 2012-05-31 NOTE — Brief Op Note (Signed)
05/31/2012  11:24 AM  PATIENT:  Blondell Reveal  27 y.o. female  PRE-OPERATIVE DIAGNOSIS:  Deviated septum; bilateral turbinate hypertrophy   POST-OPERATIVE DIAGNOSIS:  Deviated septum; bilateral turbinate hypertrophy   PROCEDURE:  Procedure(s): 1) NASAL SEPTOPLASTY  2) Bilateral partial inferior turbinate resection  SURGEON:  Surgeon(s) and Role:    * Sui W Danae Oland, MD - Primary  PHYSICIAN ASSISTANT:   ASSISTANTS: none   ANESTHESIA:   general  EBL:  Total I/O In: 1200 [I.V.:1200] Out: -   BLOOD ADMINISTERED:none  DRAINS: none   LOCAL MEDICATIONS USED:  NONE  SPECIMEN:  No Specimen  DISPOSITION OF SPECIMEN:  N/A  COUNTS:  YES  TOURNIQUET:  * No tourniquets in log *  DICTATION: .Note written in EPIC  PLAN OF CARE: Discharge to home after PACU  PATIENT DISPOSITION:  PACU - hemodynamically stable.   Delay start of Pharmacological VTE agent (>24hrs) due to surgical blood loss or risk of bleeding: not applicable

## 2012-05-31 NOTE — Transfer of Care (Signed)
Immediate Anesthesia Transfer of Care Note  Patient: Kristin Stephens  Procedure(s) Performed: Procedure(s): NASAL SEPTOPLASTY WITH TURBINATE REDUCTION (Bilateral)  Patient Location: PACU  Anesthesia Type:General  Level of Consciousness: awake and alert   Airway & Oxygen Therapy: Patient Spontanous Breathing and Patient connected to face mask oxygen  Post-op Assessment: Report given to PACU RN and Post -op Vital signs reviewed and stable  Post vital signs: Reviewed and stable  Complications: No apparent anesthesia complications

## 2012-05-31 NOTE — Anesthesia Procedure Notes (Signed)
Procedure Name: Intubation Date/Time: 05/31/2012 10:08 AM Performed by: Verlan Friends Pre-anesthesia Checklist: Patient identified, Emergency Drugs available, Suction available and Patient being monitored Patient Re-evaluated:Patient Re-evaluated prior to inductionOxygen Delivery Method: Circle System Utilized Preoxygenation: Pre-oxygenation with 100% oxygen Intubation Type: IV induction Ventilation: Mask ventilation without difficulty Laryngoscope Size: Miller and 3 Grade View: Grade I Tube type: Oral Tube size: 7.0 mm Number of attempts: 1 Airway Equipment and Method: stylet and oral airway Placement Confirmation: ETT inserted through vocal cords under direct vision,  positive ETCO2 and breath sounds checked- equal and bilateral Secured at: 22 (22) cm Tube secured with: Tape Dental Injury: Teeth and Oropharynx as per pre-operative assessment

## 2012-05-31 NOTE — Op Note (Signed)
DATE OF PROCEDURE: 05/31/2012  OPERATIVE REPORT   SURGEON: Newman Pies, MD   PREOPERATIVE DIAGNOSES:  1. Nasal septal deviation.  2. Bilateral inferior turbinate hypertrophy.  3. Chronic nasal obstruction.  POSTOPERATIVE DIAGNOSES:  1. Nasal septal deviation.  2. Bilateral inferior turbinate hypertrophy.  3. Chronic nasal obstruction.  PROCEDURE PERFORMED:  1. Septoplasty.  2. Bilateral partial inferior turbinate resection.   ANESTHESIA: General endotracheal tube anesthesia.   COMPLICATIONS: None.   ESTIMATED BLOOD LOSS: Less than xxx mL.   INDICATION FOR PROCEDURE: Kristin Stephens is a 27 y.o. female with a history of chronic nasal obstruction. The patient was treated with antihistamine, decongestant, steroid nasal spray, and systemic steroids. However, the patient continues to be symptomatic. On examination, the patient was noted to have bilateral severe inferior turbinate hypertrophy and significant nasal septal deviation, causing significant nasal obstruction. Based on the above findings, the decision was made for the patient to undergo the above-stated procedure. The risks, benefits, alternatives, and details of the procedure were discussed with the patient. Questions were invited and answered. Informed consent was obtained.   DESCRIPTION OF PROCEDURE: The patient was taken to the operating room and placed supine on the operating table. General endotracheal tube anesthesia was administered by the anesthesiologist. The patient was positioned, and prepped and draped in the standard fashion for nasal surgery. Pledgets soaked with Afrin were placed in both nasal cavities for decongestion. The pledgets were subsequently removed. The above mentioned severe septal deviation was again noted. A hemitransfixion incision was made on the left side. The mucosal flap was carefully elevated on the left side. A cartilaginous incision was made 1 cm superior to the caudal margin of the nasal septum.  Mucosal flap was also elevated on the right side in the similar fashion. It should be noted that due to the severe septal deviation, the deviated portion of the cartilaginous and bony septum had to be removed in piecemeal fashion. Once the deviated portions were removed, a straight midline septum was achieved. The septum was then quilted with 4-0 plain gut sutures. The hemitransfixion incision was closed with interrupted 4-0 chromic sutures. Doyle splints were applied.   Prior to the [facility] splint application, the inferior one half of both hypertrophied inferior turbinate was crossclamped with a Kelly clamp. The inferior one half of each inferior turbinate was then resected with a pair of cross cutting scissors. Hemostasis was achieved with a suction cautery device.   The care of the patient was turned over to the anesthesiologist. The patient was awakened from anesthesia without difficulty. The patient was extubated and transferred to the recovery room in good condition.   OPERATIVE FINDINGS: Severe nasal septal deviation and bilateral inferior turbinate hypertrophy.   SPECIMEN: None.   FOLLOWUP CARE: The patient be discharged home once she is awake and alert. The patient will be placed on Percocet 1-2 tablets p.o. q.6 hours p.r.n. pain, and amoxicillin 875 mg p.o. b.i.d. for 5 days. The patient will follow up in my office in approximately 1 week for splint removal.   Su Philomena Doheny, MD

## 2012-05-31 NOTE — Anesthesia Preprocedure Evaluation (Addendum)
Anesthesia Evaluation  Patient identified by MRN, date of birth, ID band Patient awake  General Assessment Comment:H/o TMJ  Reviewed: Allergy & Precautions, H&P , Patient's Chart, lab work & pertinent test results  Airway Mallampati: II  Neck ROM: Full  Mouth opening: Limited Mouth Opening  Dental  (+) Teeth Intact   Pulmonary asthma ,  breath sounds clear to auscultation        Cardiovascular hypertension, Rhythm:Irregular Rate:Normal  ASD,VSD, Coarctation aorta repaired as child   Neuro/Psych  Headaches, Anxiety    GI/Hepatic GERD-  ,  Endo/Other    Renal/GU      Musculoskeletal   Abdominal (+) + obese,   Peds  Hematology   Anesthesia Other Findings   Reproductive/Obstetrics                         Anesthesia Physical Anesthesia Plan  ASA: III  Anesthesia Plan: General   Post-op Pain Management:    Induction: Intravenous  Airway Management Planned: Oral ETT  Additional Equipment:   Intra-op Plan:   Post-operative Plan: Extubation in OR  Informed Consent: I have reviewed the patients History and Physical, chart, labs and discussed the procedure including the risks, benefits and alternatives for the proposed anesthesia with the patient or authorized representative who has indicated his/her understanding and acceptance.   Dental advisory given  Plan Discussed with: CRNA and Surgeon  Anesthesia Plan Comments:        Anesthesia Quick Evaluation

## 2012-06-02 ENCOUNTER — Encounter (HOSPITAL_BASED_OUTPATIENT_CLINIC_OR_DEPARTMENT_OTHER): Payer: Self-pay | Admitting: Otolaryngology

## 2012-06-03 ENCOUNTER — Ambulatory Visit (INDEPENDENT_AMBULATORY_CARE_PROVIDER_SITE_OTHER): Payer: Medicaid Other | Admitting: Otolaryngology

## 2012-06-07 ENCOUNTER — Ambulatory Visit (INDEPENDENT_AMBULATORY_CARE_PROVIDER_SITE_OTHER): Payer: 59 | Admitting: Psychiatry

## 2012-06-07 ENCOUNTER — Encounter (HOSPITAL_COMMUNITY): Payer: Self-pay | Admitting: Psychiatry

## 2012-06-07 ENCOUNTER — Ambulatory Visit (HOSPITAL_COMMUNITY): Payer: Self-pay | Admitting: Psychiatry

## 2012-06-07 VITALS — Wt 210.2 lb

## 2012-06-07 DIAGNOSIS — F39 Unspecified mood [affective] disorder: Secondary | ICD-10-CM

## 2012-06-07 DIAGNOSIS — F338 Other recurrent depressive disorders: Secondary | ICD-10-CM

## 2012-06-07 DIAGNOSIS — F988 Other specified behavioral and emotional disorders with onset usually occurring in childhood and adolescence: Secondary | ICD-10-CM

## 2012-06-07 NOTE — Progress Notes (Signed)
[facility] [address] Progress Note Kristin Stephens MRN: [id number] DOB: 10/06/1985 Age: 27 y.o.  Date: 06/07/2012 Start Time: 11:55 AM End Time: 12:35 PM  Chief Complaint: Chief Complaint  Patient presents with  . ADHD  . Follow-up  . Medication Refill   Subjective: "I'm doing better on the Celexa and better on 40 mg a day". Depression 0/10 and Anxiety 3/10, where 1 is the best and 10 is the worst.  Focus is adequately controlled with Adderall when she is in school.   History of present illness Patient is 27 year old Caucasian female who came for her followup appointment.  Pt reports that she is compliant with the psychotropic medications with good benefit and some side effects.  Her side effects are primarily interactions with antibiotics.  She especially likes the light that she uses for her seasonal affective disorder.  It is more stable now.    Current psychiatric medication Lamictal 100 mg daily Celexa 40 mg daily Adderall 10 mg daily however is prescribed 20 mg , she takes only when she needed prescribed by primary care physician Xanax from nurse practitioner. Only as needed and less now on Celexa  Past psychiatric history Patient has been seeing in this office since April 2012 by Dr Lolly Mustache however she has been seeing therapist in this office for more than 10 years.  Patient endorse history of mood swing, depression, anger and poor attention and concentration.  Patient also endorse history of self abusive behavior and endorse cutting herself with a razor blade when her grandmother was dying.  In the past she was prescribed Wellbutrin but she was admitted on medical for the she never took the Wellbutrin.  Patient denies any history of suicidal attempt or any inpatient psychiatric treatment.  She had history of burst of energy with excessive shopping, cleaning, road rage and impulse eating.   Psychosocial history Patient was raised by her mother and grandmother when her  father left when she was only 6 years old.  Patient told her father was never in her life and she was very close to her grandmother.  Patient has a lot of resentment and anger towards her father.  Patient grandmother died when she was only 4 years old.  Patient has one daughter.  Patient has a very good relationship with her boyfriend whose been very supportive.  Family history family history includes ADD / ADHD in her brothers and sister; Alcohol abuse in her father; Anxiety disorder in her brother; Bipolar disorder in her father and paternal grandmother; Dementia in her maternal grandmother and paternal grandmother; Depression in her mother; Emphysema in her paternal grandmother; Heart disease in her maternal grandmother and paternal grandmother; Hyperlipidemia in her mother; Hypertension in her mother; Personality disorder in her father; Physical abuse in her mother; Prostate cancer in her maternal grandfather; Seizures in her daughter; Sexual abuse in her mother; and Stroke in her maternal grandfather.  There is no history of Drug abuse, and OCD, and Paranoid behavior, and Schizophrenia, .  Alcohol and substance use history Patient endorse history of using marijuana and drugs and alcohol in her teens when she was self-medicating for her depression and anxiety.  However she claims to be sober since then.  Patient denies any history of DWI, tremors or blackout.  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   She is hoping to finish her schooling in 2015.  Patient has a history of attention and concentration problem  which was formally tested by psychologist in this office.    Medical history Patient has history of headache, GERD, allergic rhinitis, hypertension, irritable bowel syndrome and aortic Valve repaired.    Mental status examination Patient is well groomed and casually dressed. She is calm and cooperative.  She described her mood is better  and her affect is improved from the past   She denies any active or passive suicidal thoughts or homicidal thoughts.  Her speech is fast but clear and coherent.  Her thought process is logical linear and goal-directed.  There no psychotic symptoms present at this time.  Her attention and concentration this could.  She's oriented x3.  Her insight judgment and impulse control is okay.  Lab Results:  Results for orders placed during the hospital encounter of 05/31/12 (from the past 8736 hour(s))  POCT HEMOGLOBIN-HEMACUE   Collection Time    05/31/12  8:30 AM      Result Value Range   Hemoglobin 12.1  12.0 - 15.0 g/dL  Results for orders placed in visit on 11/19/11 (from the past 8736 hour(s))  BASIC METABOLIC PANEL   Collection Time    10/22/11  3:19 PM      Result Value Range   Sodium 139  137 - 147 mmol/L   Potassium 4.2     Chloride 103     CO2 28     Glucose 90     BUN 12  4 - 21 mg/dL   Creat 5.78     Calcium 9.6     Total Bilirubin 0.3     Bilirubin, Direct 0.1  0.01 - 0.4 mg/dL   Bilirubin, Indirect 0.2     Alkaline Phosphatase 81     AST 15     ALT 15  7 - 35 U/L   Lipase 19  0 - 53 units/L  CBC WITH DIFFERENTIAL   Collection Time    11/19/11  3:18 PM      Result Value Range   WBC 5.7     Hemoglobin 12.9  12.0 - 16.0 g/dL   HCT 38     platelet count 342    Results for orders placed in visit on 11/18/11 (from the past 8736 hour(s))  PREGNANCY, URINE   Collection Time    11/18/11  3:19 PM      Result Value Range   Preg Test, Ur NEG    Results for orders placed during the hospital encounter of 11/10/11 (from the past 8736 hour(s))  CBC WITH DIFFERENTIAL   Collection Time    11/11/11 12:16 AM      Result Value Range   WBC 8.2  4.0 - 10.5 K/uL   RBC 4.62  3.87 - 5.11 MIL/uL   Hemoglobin 13.4  12.0 - 15.0 g/dL   HCT 46.9  62.9 - 52.8 %   MCV 86.8  78.0 - 100.0 fL   MCH 29.0  26.0 - 34.0 pg   MCHC 33.4  30.0 - 36.0 g/dL   RDW 41.3  24.4 - 01.0 %   Platelets 330   150 - 400 K/uL   Neutrophils Relative 70  43 - 77 %   Neutro Abs 5.8  1.7 - 7.7 K/uL   Lymphocytes Relative 23  12 - 46 %   Lymphs Abs 1.9  0.7 - 4.0 K/uL   Monocytes Relative 7  3 - 12 %   Monocytes Absolute 0.5  0.1 - 1.0 K/uL   Eosinophils Relative 0  0 - 5 %   Eosinophils Absolute 0.0  0.0 - 0.7 K/uL   Basophils Relative 0  0 - 1 %   Basophils Absolute 0.0  0.0 - 0.1 K/uL  COMPREHENSIVE METABOLIC PANEL   Collection Time    11/11/11 12:16 AM      Result Value Range   Sodium 139  135 - 145 mEq/L   Potassium 3.5  3.5 - 5.1 mEq/L   Chloride 103  96 - 112 mEq/L   CO2 27  19 - 32 mEq/L   Glucose, Bld 87  70 - 99 mg/dL   BUN 10  6 - 23 mg/dL   Creatinine, Ser 2.95  0.50 - 1.10 mg/dL   Calcium 9.9  8.4 - 62.1 mg/dL   Total Protein 7.8  6.0 - 8.3 g/dL   Albumin 4.0  3.5 - 5.2 g/dL   AST 14  0 - 37 U/L   ALT 13  0 - 35 U/L   Alkaline Phosphatase 86  39 - 117 U/L   Total Bilirubin 0.4  0.3 - 1.2 mg/dL   GFR calc non Af Amer >90  >90 mL/min   GFR calc Af Amer >90  >90 mL/min  LIPASE, BLOOD   Collection Time    11/11/11 12:16 AM      Result Value Range   Lipase 29  11 - 59 U/L  Results for orders placed in visit on 08/28/11 (from the past 8736 hour(s))  CBC WITH DIFFERENTIAL   Collection Time    08/28/11  9:43 AM      Result Value Range   WBC 6.3  4.0 - 10.5 K/uL   RBC 4.63  3.87 - 5.11 MIL/uL   Hemoglobin 13.7  12.0 - 15.0 g/dL   HCT 30.8  65.7 - 84.6 %   MCV 86.8  78.0 - 100.0 fL   MCH 29.6  26.0 - 34.0 pg   MCHC 34.1  30.0 - 36.0 g/dL   RDW 96.2  95.2 - 84.1 %   Platelets 332  150 - 400 K/uL   Neutrophils Relative 63  43 - 77 %   Neutro Abs 4.0  1.7 - 7.7 K/uL   Lymphocytes Relative 30  12 - 46 %   Lymphs Abs 1.9  0.7 - 4.0 K/uL   Monocytes Relative 7  3 - 12 %   Monocytes Absolute 0.4  0.1 - 1.0 K/uL   Eosinophils Relative 0  0 - 5 %   Eosinophils Absolute 0.0  0.0 - 0.7 K/uL   Basophils Relative 0  0 - 1 %   Basophils Absolute 0.0  0.0 - 0.1 K/uL   Smear Review  Criteria for review not met    COMPREHENSIVE METABOLIC PANEL   Collection Time    08/28/11  9:43 AM      Result Value Range   Sodium 138  135 - 145 mEq/L   Potassium 4.5  3.5 - 5.3 mEq/L   Chloride 103  96 - 112 mEq/L   CO2 28  19 - 32 mEq/L   Glucose, Bld 74  70 - 99 mg/dL   BUN 9  6 - 23 mg/dL   Creat 3.24  4.01 - 0.27 mg/dL   Total Bilirubin 0.4  0.3 - 1.2 mg/dL   Alkaline Phosphatase 73  39 - 117 U/L   AST 13  0 - 37 U/L   ALT 17  0 - 35 U/L   Total Protein 6.8  6.0 -  8.3 g/dL   Albumin 4.0  3.5 - 5.2 g/dL   Calcium 9.3  8.4 - 16.1 mg/dL  HEMOGLOBIN W9U   Collection Time    08/28/11  9:43 AM      Result Value Range   Hemoglobin A1C 5.0  <5.7 %   Mean Plasma Glucose 97  <117 mg/dL  Family doctor draws these  Assessment Axis I Mood disorder NOS, Seasonal Affective Disorder, ADD by history.  Axis II deferred Axis III see medical history Axis IV mild to moderate Axis V 60-65  Plan: I took her vitals.  I reviewed CC, tobacco/med/surg Hx, meds effects/ side effects, problem list, therapies and responses as well as current situation/symptoms discussed options. Add back light therapy and Adderall, consider increasing Celexa to 50 mg a day for a short bit. See orders and pt instructions for more details.  Medical Decision Making Problem Points:  Established problem, stable/improving (1), Review of last therapy session (1) and Review of psycho-social stressors (1) Data Points:  Review or order clinical lab tests (1) Review of medication regiment & side effects (2)  I certify that outpatient services furnished can reasonably be expected to improve the patient's condition.   Orson Aloe, MD, [facility]

## 2012-06-07 NOTE — Patient Instructions (Signed)
Strongly consider attending at least 6 Alanon Meetings to help you learn about how your helping others to the exclusion of helping yourself is actually hurting yourself and is actually an addiction to fixing others and that you need to work the 12 Step to Happiness through the [employer]. Al-Anon Family Groups could be helpful with how to deal with substance abusing family and friends. Or your own issues of being in victim role.  There are only 40 Alanon Family Group meetings a week here in [place].  Online are current listing of those meetings @ [email or web address].[email or web address]/meetings.[email or web address]  There are [employer].  Search on line and there you can learn the format and can access the schedule for yourself.  Their number is [phone]  Use the full spectrum light for the Vitamin D  Light therapy can be very helpful for sadness that is associated with a particular season of the year.  A light therapy light such as the SADelite from [employer] can be very helpful for the management of the condition of Seasonal Affective Disorder.  One should try this light first at a early time like before 8AM for only 5 minutes the first day, then 10 minutes on the second day.   The light can be shown on the face at a distance of 15 inches or on the back of the calves. If irritability occurs then shift to evening time dosing like after 6PM.   If mania occurs then stop and consult your prescriber on how to proceed. If no response or minimal response is noted then increase to 15 minutes a day.  You can gradually increase until the symptoms of depression subside. Some people may need only 10 to 15 minutes a day, while others may need as much as 30 minutes in the morning and 30 minutes in the evening, but this large amount of time should be used under the direct guidance of your prescriber. Again mania can occur with this, particularly in those with an official diagnosis of bipolar disorder.   In that case the doseing (amount of time a day) needs to be discussed with your prescriber.   [facility] in [place] has free counseling sessions.   Spiritual connection is where it is at for you.   And get 12 hugs a day  Call if problems or concerns.

## 2012-06-14 ENCOUNTER — Encounter: Payer: Self-pay | Admitting: Pulmonary Disease

## 2012-06-14 ENCOUNTER — Ambulatory Visit (INDEPENDENT_AMBULATORY_CARE_PROVIDER_SITE_OTHER): Payer: Medicaid Other | Admitting: Pulmonary Disease

## 2012-06-14 VITALS — BP 120/80 | HR 89 | Temp 98.2°F | Ht 64.0 in | Wt 216.8 lb

## 2012-06-14 DIAGNOSIS — J45909 Unspecified asthma, uncomplicated: Secondary | ICD-10-CM

## 2012-06-14 NOTE — Assessment & Plan Note (Signed)
The patient is doing fairly well from a pulmonary standpoint on her current aspirin regimen.  She had some temporary issues earlier in the winter with the low temperatures and dry air, but is now back to baseline.  I have asked her to try and keep her mouth covered during the cold times so that the air is warmed.  She has quit smoking, and is now trying to work on an exercise program.  I have commended her on this.  She is to stay on her current maintenance regimen, and followup with me again in a year if doing well.

## 2012-06-14 NOTE — Progress Notes (Signed)
  Subjective:    Patient ID: Kristin Stephens, female    DOB: 09/08/1985, 27 y.o.   MRN: [id number]  HPI Patient comes in today for followup of her known asthma.  She has had no acute exacerbation or increasing rescue inhaler use since last visit.  She has stopped smoking, and is working on an exercise program.  She had some increase in symptoms earlier in the year when the weather was cold and dry, but now is back to baseline.  She denies any cough or congestion.   Review of Systems  Constitutional: Negative for fever and unexpected weight change.  HENT: Positive for congestion, rhinorrhea, sneezing, postnasal drip and sinus pressure. Negative for ear pain, nosebleeds, sore throat, trouble swallowing and dental problem.        Possibly d/t nasal surgery  Eyes: Negative for redness and itching.  Respiratory: Negative for cough, chest tightness, shortness of breath and wheezing.   Cardiovascular: Negative for palpitations and leg swelling.  Gastrointestinal: Negative for nausea and vomiting.  Genitourinary: Negative for dysuria.  Musculoskeletal: Negative for joint swelling.  Skin: Positive for rash ( eczema).  Neurological: Negative for headaches.  Hematological: Does not bruise/bleed easily.  Psychiatric/Behavioral: Negative for dysphoric mood. The patient is not nervous/anxious.        Objective:   Physical Exam Obese female in no acute distress Nose without purulence or discharge noted Neck without lymphadenopathy or thyromegaly Chest clear to auscultation, no wheezing Cardiac exam with regular rate and rhythm  lower extremities with no significant edema, no cyanosis Alert and oriented, moves all 4 extremities.       Assessment & Plan:

## 2012-06-14 NOTE — Patient Instructions (Addendum)
You are doing great!  Stay on your current medications, and let us know if you are having breathing issues. followup with me in one year.

## 2012-06-23 ENCOUNTER — Telehealth (HOSPITAL_COMMUNITY): Payer: Self-pay | Admitting: Psychiatry

## 2012-06-23 DIAGNOSIS — F319 Bipolar disorder, unspecified: Secondary | ICD-10-CM

## 2012-06-23 MED ORDER — LAMOTRIGINE 100 MG PO TABS: 100.0000 mg | ORAL_TABLET | Freq: Every day | ORAL | Status: DC

## 2012-06-23 NOTE — Telephone Encounter (Signed)
Lamictal ONE hundred was renewed and only enough to make it to the May 2nd appointment, via eScripts

## 2012-06-28 ENCOUNTER — Telehealth: Payer: Self-pay | Admitting: Family Medicine

## 2012-06-28 NOTE — Telephone Encounter (Signed)
Pt is existing with [facility], needs referral for Left shoulder pain, seen them before for this, requesting appt ASAP due to pain,  Pt has Medicaid and referral is required.  If OK, please initiate referral in system so I may proceed.  Please advise

## 2012-06-29 NOTE — Telephone Encounter (Signed)
Nurses, let's please to one ortho consult. I will cosign order after you to

## 2012-06-30 ENCOUNTER — Other Ambulatory Visit: Payer: Self-pay | Admitting: *Deleted

## 2012-06-30 ENCOUNTER — Ambulatory Visit (INDEPENDENT_AMBULATORY_CARE_PROVIDER_SITE_OTHER): Payer: 59 | Admitting: Psychiatry

## 2012-06-30 DIAGNOSIS — F319 Bipolar disorder, unspecified: Secondary | ICD-10-CM

## 2012-06-30 DIAGNOSIS — M25512 Pain in left shoulder: Secondary | ICD-10-CM

## 2012-06-30 NOTE — Telephone Encounter (Signed)
Referral done, appt with Dr. Ave Filter @ [facility] 07/07/12 @ 9:15, spoke to pt to notify

## 2012-07-01 ENCOUNTER — Emergency Department (HOSPITAL_COMMUNITY)
Admission: EM | Admit: 2012-07-01 | Discharge: 2012-07-01 | Disposition: A | Discharge status: Home / Self Care | Payer: Medicaid Other | Attending: Emergency Medicine | Admitting: Emergency Medicine

## 2012-07-01 ENCOUNTER — Emergency Department (HOSPITAL_COMMUNITY): Payer: Medicaid Other

## 2012-07-01 ENCOUNTER — Encounter (HOSPITAL_COMMUNITY): Payer: Self-pay | Admitting: Unknown Physician Specialty

## 2012-07-01 DIAGNOSIS — Z98811 Dental restoration status: Secondary | ICD-10-CM | POA: Insufficient documentation

## 2012-07-01 DIAGNOSIS — IMO0002 Reserved for concepts with insufficient information to code with codable children: Secondary | ICD-10-CM | POA: Insufficient documentation

## 2012-07-01 DIAGNOSIS — R05 Cough: Secondary | ICD-10-CM | POA: Insufficient documentation

## 2012-07-01 DIAGNOSIS — Z79899 Other long term (current) drug therapy: Secondary | ICD-10-CM | POA: Insufficient documentation

## 2012-07-01 DIAGNOSIS — Z8709 Personal history of other diseases of the respiratory system: Secondary | ICD-10-CM | POA: Insufficient documentation

## 2012-07-01 DIAGNOSIS — Z87891 Personal history of nicotine dependence: Secondary | ICD-10-CM | POA: Insufficient documentation

## 2012-07-01 DIAGNOSIS — R059 Cough, unspecified: Secondary | ICD-10-CM | POA: Insufficient documentation

## 2012-07-01 DIAGNOSIS — F319 Bipolar disorder, unspecified: Secondary | ICD-10-CM | POA: Insufficient documentation

## 2012-07-01 DIAGNOSIS — R0789 Other chest pain: Secondary | ICD-10-CM | POA: Insufficient documentation

## 2012-07-01 DIAGNOSIS — Z872 Personal history of diseases of the skin and subcutaneous tissue: Secondary | ICD-10-CM | POA: Insufficient documentation

## 2012-07-01 DIAGNOSIS — Z8719 Personal history of other diseases of the digestive system: Secondary | ICD-10-CM | POA: Insufficient documentation

## 2012-07-01 DIAGNOSIS — K219 Gastro-esophageal reflux disease without esophagitis: Secondary | ICD-10-CM | POA: Insufficient documentation

## 2012-07-01 DIAGNOSIS — J45901 Unspecified asthma with (acute) exacerbation: Secondary | ICD-10-CM | POA: Insufficient documentation

## 2012-07-01 DIAGNOSIS — F411 Generalized anxiety disorder: Secondary | ICD-10-CM | POA: Insufficient documentation

## 2012-07-01 MED ORDER — IPRATROPIUM BROMIDE 0.02 % IN SOLN
0.5000 mg | Freq: Once | RESPIRATORY_TRACT | Status: AC
  Administered 2012-07-01: 0.5 mg via RESPIRATORY_TRACT
  Filled 2012-07-01: qty 2.5

## 2012-07-01 MED ORDER — ALBUTEROL SULFATE (5 MG/ML) 0.5% IN NEBU
5.0000 mg | INHALATION_SOLUTION | Freq: Once | RESPIRATORY_TRACT | Status: AC
  Administered 2012-07-01: 5 mg via RESPIRATORY_TRACT
  Filled 2012-07-01: qty 1

## 2012-07-01 MED ORDER — ALBUTEROL SULFATE HFA 108 (90 BASE) MCG/ACT IN AERS: 1.0000 | INHALATION_SPRAY | Freq: Four times a day (QID) | RESPIRATORY_TRACT | Status: DC | PRN

## 2012-07-01 NOTE — Patient Instructions (Signed)
Discussed orally 

## 2012-07-01 NOTE — ED Notes (Signed)
Patient arrived via [facility] with shortness of breath. Patient has a history of asthma and felt tightness in her throat and then became short of breath. Patient has a rescue inhaler that she did not have with her. EMS states she was not short of breath with them. Patient arrived off monitor.

## 2012-07-01 NOTE — ED Provider Notes (Signed)
Medical screening examination/treatment/procedure(s) were performed by non-physician practitioner and as supervising physician I was immediately available for consultation/collaboration.  Gilda Crease, MD 07/01/12 [phone]

## 2012-07-01 NOTE — Progress Notes (Signed)
Patient:  Kristin Stephens   DOB: 17-Nov-1985  MR Number: [id number]  Location: [facility]:  [address] [place] [place],  [place], [address]  Start: Wednesday 06/30/2012 10:00 AM End: Wednesday 06/30/2012 10:50 AM  Provider/Observer:     Florencia Reasons, MSW, LCSW   Chief Complaint:      Chief Complaint  Patient presents with  . Anxiety    Reason For Service:     The patient is a returning patient to this practice and reports increased symptoms of anxiety and depression. She has a history of bipolar disorder. Patient is seen today for follow up appointment.  Interventions Strategy:  Supportive therapy,cognitive therapy  Participation Level:   Active  Participation Quality:  Appropriate    Behavioral Observation:  Casual, Alert,  anxious,   Current Psychosocial Factors: Patient recently had surgery and states experiencing a mental breakdown after surgery.  Content of Session:   Reviewing symptoms, processing feelings, working with patient regarding acceptance of her mental disorder, identifying coping statements  Current Status:   The patient reports improved mood but continued anxiety  Patient Progress:   Fair. The patient reports having successful nasal surgery and being happy that she can now breathe clearly through her nose. However, she states having a mental breakdown a day after the surgery and experiencing racing and uncontrollable thoughts. She also reports being informed she yelled, screamed, and broke up with her boyfriend. Patient has no recollection of these events. She has seen Dr. Dan Humphreys regarding this. Patient reports this situation provided more information to her family regarding her illness. However, she expresses frustration as she states that some family members still think she can just snap out of it. Therapist and patient agreed to include family members in upcoming sessions to facilitate more support for patient. Therapist also provides patient with contact  information regarding depression and bipolar support groups in [place].   Target Goals:   1. Increase understanding of mental disorder and improve self-care ; 1: 1 psychoeducation and cognitive behavior therapy, one time every 1-4 weeks     2. Improve communication skills (decrease yelling) and ability to identify and verbalize feelings; 1-1 psychotherapy ( supportive and cognitive therapy) one time every 1-4 weeks    3. Decrease anxiety and improve relaxation/coping techniques; 1-1 psychotherapy (supportive and cognitive behavioral therapy) one time every 1-4 weeks    4. Increase positive thought patterns and decreased negative thought patterns; 1:1 psychotherapy (supportive and cognitive therapy) one time every 1-4 weeks  Last Reviewed:   01/19/2012  Goals Addressed Today:    Goals 1 and 3  Impression/Diagnosis:   The patient has a long-standing history of symptoms of anxiety and depression along with mood swings beginning in early adolescence. Symptoms have worsened since the birth of her daughter in January 2011. Current symptoms now include anxiety, depressed mood, ruminating thoughts, irritability, mood swings, and crying spells. Diagnosis: bipolar 1 disorder  Diagnosis:  Axis I:  Bipolar disorder          Axis II: Deferred

## 2012-07-01 NOTE — ED Provider Notes (Signed)
History     CSN: [id number]  Arrival date & time 07/01/12  1403   First MD Initiated Contact with Patient 07/01/12 1404      Chief Complaint  Patient presents with  . Shortness of Breath    (Consider location/radiation/quality/duration/timing/severity/associated sxs/prior treatment) HPI Comments: Pt presenting to the ED via EMS for SOB with chest and throat tightness PTA.   Pt has hx of asthma but is currently out of her rescue inhaler. Notes a recent productive cough with green sputum over the past week.  Also notes that she ate honey flavored teddy grams which she has never eaten before and is unsure if she had an allergic rxn.  Pt denies any fever, chills, abdominal pain, nausea, vomiting, dizziness, or weakness.  Uses albuterol inhaler sparingly.  Has a nebulizer machine at home which she seldom uses.  The history is provided by the patient.    Past Medical History  Diagnosis Date  . Esophageal reflux   . IBS (irritable bowel syndrome)   . Anxiety   . Depression   . Bipolar affective disorder   . TMJ syndrome   . Asthma     daily and prn inhalers  . Deviated nasal septum 05/2012  . Nasal turbinate hypertrophy 05/2012    bilateral  . Dental crowns present   . Eczema     right leg    Past Surgical History  Procedure Laterality Date  . Asd and vsd repair  1989  . Coarctation of aorta repair  1989  . Cesarean section  04/12/2009  . Esophagogastroduodenoscopy  04/04/2008      Normal esophagus/Mild patchy erythema in the antrum/ Normal duodenal bulb, normal small bowel biopsy  . Flexible sigmoidoscopy  04/04/2008      Small internal hemorrhoids (poor bowel prep)  . Cardiac catheterization  06/18/2006  . Nasal septoplasty w/ turbinoplasty Bilateral 05/31/2012    Procedure: NASAL SEPTOPLASTY WITH TURBINATE REDUCTION;  Surgeon: Darletta Moll, MD;  Location: [facility];  Service: ENT;  Laterality: Bilateral;    Family History  Problem Relation Age of Onset  .  Hyperlipidemia Mother   . Hypertension Mother   . Depression Mother   . Physical abuse Mother   . Sexual abuse Mother     possibly  . Emphysema Paternal Grandmother   . Heart disease Paternal Grandmother   . Bipolar disorder Paternal Grandmother   . Dementia Paternal Grandmother   . Heart disease Maternal Grandmother   . Dementia Maternal Grandmother   . Stroke Maternal Grandfather   . Prostate cancer Maternal Grandfather   . Personality disorder Father   . Bipolar disorder Father   . Alcohol abuse Father   . ADD / ADHD Sister   . Anxiety disorder Brother   . ADD / ADHD Brother   . ADD / ADHD Brother   . Seizures Daughter   . Drug abuse Neg Hx   . OCD Neg Hx   . Paranoid behavior Neg Hx   . Schizophrenia Neg Hx     History  Substance Use Topics  . Smoking status: Former Smoker -- 0.50 packs/day for 10 years    Types: Cigarettes    Quit date: 04/01/2011  . Smokeless tobacco: Former Neurosurgeon    Quit date: 05/01/2012     Comment: 1 pack/week  . Alcohol Use: Yes     Comment: 1-2 x/month    OB History   Grav Para Term Preterm Abortions TAB SAB Ect Mult Living  Review of Systems  Respiratory: Positive for cough, chest tightness and shortness of breath.   All other systems reviewed and are negative.    Allergies  Sulfonamide derivatives and Coconut flavor  Home Medications   Current Outpatient Rx  Name  Route  Sig  Dispense  Refill  . albuterol (PROVENTIL HFA;VENTOLIN HFA) 108 (90 BASE) MCG/ACT inhaler   Inhalation   Inhale 2 puffs into the lungs every 6 (six) hours as needed for wheezing.   1 Inhaler   6   . EXPIRED: albuterol (PROVENTIL,VENTOLIN) 90 MCG/ACT inhaler   Inhalation   Inhale 2 puffs into the lungs every 6 (six) hours as needed.         . ALPRAZolam (XANAX) 0.5 MG tablet   Oral   Take 0.5 mg by mouth at bedtime as needed.         Marland Kitchen amphetamine-dextroamphetamine (ADDERALL) 20 MG tablet   Oral   Take 20 mg by mouth daily.  Sometimes takes additional 10 mg. in afternoon         . beclomethasone (QVAR) 80 MCG/ACT inhaler   Inhalation   Inhale 2 puffs into the lungs 2 (two) times daily.   1 Inhaler   6   . citalopram (CELEXA) 40 MG tablet   Oral   Take 40 mg by mouth daily.         Marland Kitchen dexlansoprazole (DEXILANT) 60 MG capsule   Oral   Take 1 capsule (60 mg total) by mouth daily.   31 capsule   11   . lamoTRIgine (LAMICTAL) 100 MG tablet   Oral   Take 1 tablet (100 mg total) by mouth daily.   30 tablet   1     ONE HUNDRED only not anything higher PLEASE, ONLY  .Marland Kitchen.   . medroxyPROGESTERone (DEPO-PROVERA) 150 MG/ML injection   Intramuscular   Inject 150 mg into the muscle every 3 (three) months.         . simethicone (MYLICON) 125 MG chewable tablet   Oral   Chew 125 mg by mouth every 6 (six) hours as needed.           BP 131/78  Temp(Src) 98.3 F (36.8 C) (Oral)  Resp 20  SpO2 98%  Physical Exam  Nursing note and vitals reviewed. Constitutional: She is oriented to person, place, and time. She appears well-developed and well-nourished.  HENT:  Head: Normocephalic and atraumatic. No trismus in the jaw.  Mouth/Throat: Uvula is midline, oropharynx is clear and moist and mucous membranes are normal. No dental abscesses or edematous. No oropharyngeal exudate, posterior oropharyngeal edema, posterior oropharyngeal erythema or tonsillar abscesses.  Eyes: Conjunctivae and EOM are normal. Pupils are equal, round, and reactive to light.  Neck: Normal range of motion. Neck supple. No tracheal deviation present.  Cardiovascular: Normal rate, regular rhythm and normal heart sounds.   Pulmonary/Chest: Effort normal and breath sounds normal. No accessory muscle usage. No respiratory distress. She has no wheezes. She has no rales. She exhibits no tenderness.  Abdominal: Soft. Normal appearance and bowel sounds are normal. There is no tenderness. There is no guarding.  Lymphadenopathy:    She has no  cervical adenopathy.  Neurological: She is alert and oriented to person, place, and time.  Skin: Skin is warm and dry.  Psychiatric: She has a normal mood and affect.    ED Course  Procedures (including critical care time)  Labs Reviewed - No data to display Dg Chest 2 View  07/01/2012  *RADIOLOGY REPORT*  Clinical Data: Shortness of breath  CHEST - 2 VIEW  Comparison: November 10, 2011.  Findings: Cardiomediastinal silhouette appears normal.  Surgical clips are seen projected over the superior mediastinum.  No acute pulmonary disease is noted.  Bony thorax is intact.  IMPRESSION: No acute cardiopulmonary abnormality seen.   Original Report Authenticated By: Lupita Raider.,  M.D.      1. Asthma attack       MDM   Pt presenting to the ED with chest tightness and SOB.  CXR neg for acute processes.  Good relief of sx with albuterol/atrovent neb.  O2 sats 100%.  Rx albuterol.  FU with PCP if sx worsen.  Return precautions advised.        Garlon Hatchet, PA-C 07/01/12 1610

## 2012-07-12 ENCOUNTER — Telehealth: Payer: Self-pay | Admitting: Family Medicine

## 2012-07-12 NOTE — Telephone Encounter (Signed)
Discussed with patient

## 2012-07-12 NOTE — Telephone Encounter (Signed)
Patient needs a refill of her Xanax .5mg  sent to [facility].

## 2012-07-12 NOTE — Telephone Encounter (Signed)
Pt was referred urgently to mental health that day. That is who needs to maintain her medication anxiety management. Surely they scheduled a follow up visit with her serious m h concerns.

## 2012-07-12 NOTE — Telephone Encounter (Signed)
Seen in office 06/07/12

## 2012-07-16 ENCOUNTER — Telehealth: Payer: Self-pay | Admitting: *Deleted

## 2012-07-16 MED ORDER — AMOXICILLIN-POT CLAVULANATE 875-125 MG PO TABS: 1.0000 | ORAL_TABLET | Freq: Two times a day (BID) | ORAL | Status: AC

## 2012-07-16 NOTE — Telephone Encounter (Signed)
Med electronically sent to [facility]. Patient notified.

## 2012-07-16 NOTE — Telephone Encounter (Signed)
Aug 875 bid 10d 

## 2012-07-16 NOTE — Telephone Encounter (Signed)
C/o cough and congestion-drainage-green discharge- no fever no wheezing Would like med called into [facility]

## 2012-07-28 ENCOUNTER — Ambulatory Visit (INDEPENDENT_AMBULATORY_CARE_PROVIDER_SITE_OTHER): Payer: 59 | Admitting: Psychiatry

## 2012-07-28 ENCOUNTER — Encounter (HOSPITAL_COMMUNITY): Payer: Self-pay | Admitting: Psychiatry

## 2012-07-28 VITALS — BP 122/69 | HR 95 | Ht 65.5 in | Wt 207.2 lb

## 2012-07-28 DIAGNOSIS — F319 Bipolar disorder, unspecified: Secondary | ICD-10-CM

## 2012-07-28 DIAGNOSIS — F39 Unspecified mood [affective] disorder: Secondary | ICD-10-CM

## 2012-07-28 DIAGNOSIS — F338 Other recurrent depressive disorders: Secondary | ICD-10-CM

## 2012-07-28 DIAGNOSIS — F988 Other specified behavioral and emotional disorders with onset usually occurring in childhood and adolescence: Secondary | ICD-10-CM

## 2012-07-28 MED ORDER — LAMOTRIGINE 100 MG PO TABS: 100.0000 mg | ORAL_TABLET | Freq: Every day | ORAL | Status: DC

## 2012-07-28 MED ORDER — CITALOPRAM HYDROBROMIDE 40 MG PO TABS: 40.0000 mg | ORAL_TABLET | Freq: Every day | ORAL | Status: DC

## 2012-07-28 MED ORDER — ALPRAZOLAM 0.5 MG PO TABS: 0.5000 mg | ORAL_TABLET | Freq: Every evening | ORAL | Status: DC | PRN

## 2012-07-28 NOTE — Progress Notes (Signed)
[facility] [address] Progress Note Kristin Stephens MRN: [id number] DOB: 1985/10/25 Age: 27 y.o.  Date: 07/28/2012 Start Time: 9:02 AM End Time: 9:22 AM  Chief Complaint: Chief Complaint  Patient presents with  . ADHD  . Depression  . Follow-up  . Medication Refill   Subjective: "I like the Celexa.  This is finals week and I am doing okay". Depression 3/10 and Anxiety 5/10, where 1 is the best and 10 is the worst.  Focus is adequately controlled with Adderall when she is in school.   History of present illness Patient is 27 year old Caucasian female who came for her followup appointment.  Pt reports that she is compliant with the psychotropic medications with good benefit and no noticeable side effects.  She is doing well with her mood control.  Her PCP is writing the Adderall, but opts for me to write the Xanax, which she uses 30 tabs in 6 months period of time.  She likes going to the [employer]. She liked the speaker meeting on Wednesday nite.  She bought the book "Courage to Change".   Current psychiatric medication Lamictal 100 mg daily Celexa 40 mg daily Adderall 10 mg daily however is prescribed 20 mg , she takes only when she needed prescribed by primary care physician Xanax from nurse practitioner. Only as needed and less now on Celexa  Past psychiatric history Patient has been seeing in this office since April 2012 by Dr Lolly Mustache however she has been seeing therapist in this office for more than 10 years.  Patient endorse history of mood swing, depression, anger and poor attention and concentration.  Patient also endorse history of self abusive behavior and endorse cutting herself with a razor blade when her grandmother was dying.  In the past she was prescribed Wellbutrin but she was admitted on medical for the she never took the Wellbutrin.  Patient denies any history of suicidal attempt or any inpatient psychiatric treatment.  She had history of burst of  energy with excessive shopping, cleaning, road rage and impulse eating.   Psychosocial history Patient was raised by her mother and grandmother when her father left when she was only 37 years old.  Patient told her father was never in her life and she was very close to her grandmother.  Patient has a lot of resentment and anger towards her father.  Patient grandmother died when she was only 24 years old.  Patient has one daughter.  Patient has a very good relationship with her boyfriend whose been very supportive.  Family history family history includes ADD / ADHD in her brothers and sister; Alcohol abuse in her father; Anxiety disorder in her brother; Bipolar disorder in her father and paternal grandmother; Dementia in her maternal grandmother and paternal grandmother; Depression in her mother; Emphysema in her paternal grandmother; Heart disease in her maternal grandmother and paternal grandmother; Hyperlipidemia in her mother; Hypertension in her mother; Personality disorder in her father; Physical abuse in her mother; Prostate cancer in her maternal grandfather; Seizures in her daughter; Sexual abuse in her mother; and Stroke in her maternal grandfather.  There is no history of Drug abuse, and OCD, and Paranoid behavior, and Schizophrenia, .  Alcohol and substance use history Patient endorse history of using marijuana and drugs and alcohol in her teens when she was self-medicating for her depression and anxiety.  However she claims to be sober since then.  Patient denies any history of DWI, tremors or blackout.  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   She is hoping to finish her schooling in 2015.  Patient has a history of attention and concentration problem which was formally tested by psychologist in this office.    Medical history Patient has history of headache, GERD, allergic rhinitis, hypertension, irritable bowel syndrome and aortic  Valve repaired.    Mental status examination Patient is well groomed and casually dressed. She is calm and cooperative.  She described her mood is better and her affect is improved from the past   She denies any active or passive suicidal thoughts or homicidal thoughts.  Her speech is fast but clear and coherent.  Her thought process is logical linear and goal-directed.  There no psychotic symptoms present at this time.  Her attention and concentration this could.  She's oriented x3.  Her insight judgment and impulse control is okay.  Lab Results:  Results for orders placed during the hospital encounter of 05/31/12 (from the past 8736 hour(s))  POCT HEMOGLOBIN-HEMACUE   Collection Time    05/31/12  8:30 AM      Result Value Range   Hemoglobin 12.1  12.0 - 15.0 g/dL  Results for orders placed in visit on 11/19/11 (from the past 8736 hour(s))  BASIC METABOLIC PANEL   Collection Time    10/22/11  3:19 PM      Result Value Range   Sodium 139  137 - 147 mmol/L   Potassium 4.2     Chloride 103     CO2 28     Glucose 90     BUN 12  4 - 21 mg/dL   Creat 9.14     Calcium 9.6     Total Bilirubin 0.3     Bilirubin, Direct 0.1  0.01 - 0.4 mg/dL   Bilirubin, Indirect 0.2     Alkaline Phosphatase 81     AST 15     ALT 15  7 - 35 U/L   Lipase 19  0 - 53 units/L  CBC WITH DIFFERENTIAL   Collection Time    11/19/11  3:18 PM      Result Value Range   WBC 5.7     Hemoglobin 12.9  12.0 - 16.0 g/dL   HCT 38     platelet count 342    Results for orders placed in visit on 11/18/11 (from the past 8736 hour(s))  PREGNANCY, URINE   Collection Time    11/18/11  3:19 PM      Result Value Range   Preg Test, Ur NEG    Results for orders placed during the hospital encounter of 11/10/11 (from the past 8736 hour(s))  CBC WITH DIFFERENTIAL   Collection Time    11/11/11 12:16 AM      Result Value Range   WBC 8.2  4.0 - 10.5 K/uL   RBC 4.62  3.87 - 5.11 MIL/uL   Hemoglobin 13.4  12.0 - 15.0 g/dL    HCT 78.2  95.6 - 21.3 %   MCV 86.8  78.0 - 100.0 fL   MCH 29.0  26.0 - 34.0 pg   MCHC 33.4  30.0 - 36.0 g/dL   RDW 08.6  57.8 - 46.9 %   Platelets 330  150 - 400 K/uL   Neutrophils Relative 70  43 - 77 %   Neutro Abs 5.8  1.7 - 7.7 K/uL   Lymphocytes Relative 23  12 - 46 %  Lymphs Abs 1.9  0.7 - 4.0 K/uL   Monocytes Relative 7  3 - 12 %   Monocytes Absolute 0.5  0.1 - 1.0 K/uL   Eosinophils Relative 0  0 - 5 %   Eosinophils Absolute 0.0  0.0 - 0.7 K/uL   Basophils Relative 0  0 - 1 %   Basophils Absolute 0.0  0.0 - 0.1 K/uL  COMPREHENSIVE METABOLIC PANEL   Collection Time    11/11/11 12:16 AM      Result Value Range   Sodium 139  135 - 145 mEq/L   Potassium 3.5  3.5 - 5.1 mEq/L   Chloride 103  96 - 112 mEq/L   CO2 27  19 - 32 mEq/L   Glucose, Bld 87  70 - 99 mg/dL   BUN 10  6 - 23 mg/dL   Creatinine, Ser 1.61  0.50 - 1.10 mg/dL   Calcium 9.9  8.4 - 09.6 mg/dL   Total Protein 7.8  6.0 - 8.3 g/dL   Albumin 4.0  3.5 - 5.2 g/dL   AST 14  0 - 37 U/L   ALT 13  0 - 35 U/L   Alkaline Phosphatase 86  39 - 117 U/L   Total Bilirubin 0.4  0.3 - 1.2 mg/dL   GFR calc non Af Amer >90  >90 mL/min   GFR calc Af Amer >90  >90 mL/min  LIPASE, BLOOD   Collection Time    11/11/11 12:16 AM      Result Value Range   Lipase 29  11 - 59 U/L  Results for orders placed in visit on 08/28/11 (from the past 8736 hour(s))  CBC WITH DIFFERENTIAL   Collection Time    08/28/11  9:43 AM      Result Value Range   WBC 6.3  4.0 - 10.5 K/uL   RBC 4.63  3.87 - 5.11 MIL/uL   Hemoglobin 13.7  12.0 - 15.0 g/dL   HCT 04.5  40.9 - 81.1 %   MCV 86.8  78.0 - 100.0 fL   MCH 29.6  26.0 - 34.0 pg   MCHC 34.1  30.0 - 36.0 g/dL   RDW 91.4  78.2 - 95.6 %   Platelets 332  150 - 400 K/uL   Neutrophils Relative 63  43 - 77 %   Neutro Abs 4.0  1.7 - 7.7 K/uL   Lymphocytes Relative 30  12 - 46 %   Lymphs Abs 1.9  0.7 - 4.0 K/uL   Monocytes Relative 7  3 - 12 %   Monocytes Absolute 0.4  0.1 - 1.0 K/uL    Eosinophils Relative 0  0 - 5 %   Eosinophils Absolute 0.0  0.0 - 0.7 K/uL   Basophils Relative 0  0 - 1 %   Basophils Absolute 0.0  0.0 - 0.1 K/uL   Smear Review Criteria for review not met    COMPREHENSIVE METABOLIC PANEL   Collection Time    08/28/11  9:43 AM      Result Value Range   Sodium 138  135 - 145 mEq/L   Potassium 4.5  3.5 - 5.3 mEq/L   Chloride 103  96 - 112 mEq/L   CO2 28  19 - 32 mEq/L   Glucose, Bld 74  70 - 99 mg/dL   BUN 9  6 - 23 mg/dL   Creat 2.13  0.86 - 5.78 mg/dL   Total Bilirubin 0.4  0.3 - 1.2 mg/dL  Alkaline Phosphatase 73  39 - 117 U/L   AST 13  0 - 37 U/L   ALT 17  0 - 35 U/L   Total Protein 6.8  6.0 - 8.3 g/dL   Albumin 4.0  3.5 - 5.2 g/dL   Calcium 9.3  8.4 - 30.8 mg/dL  HEMOGLOBIN M5H   Collection Time    08/28/11  9:43 AM      Result Value Range   Hemoglobin A1C 5.0  <5.7 %   Mean Plasma Glucose 97  <117 mg/dL  Family doctor draws these  Assessment Axis I Mood disorder NOS, Seasonal Affective Disorder, ADD by history.  Axis II deferred Axis III see medical history Axis IV mild to moderate Axis V 60-65  Plan: I took her vitals.  I reviewed CC, tobacco/med/surg Hx, meds effects/ side effects, problem list, therapies and responses as well as current situation/symptoms discussed options. Add back light therapy and Adderall, consider increasing Celexa to 50 mg a day for a short bit. See orders and pt instructions for more details.  MEDICATIONS this encounter: Meds ordered this encounter  Medications  . lamoTRIgine (LAMICTAL) 100 MG tablet    Sig: Take 1 tablet (100 mg total) by mouth daily.    Dispense:  30 tablet    Refill:  2  . citalopram (CELEXA) 40 MG tablet    Sig: Take 1 tablet (40 mg total) by mouth daily.    Dispense:  30 tablet    Refill:  2  . ALPRAZolam (XANAX) 0.5 MG tablet    Sig: Take 1 tablet (0.5 mg total) by mouth at bedtime as needed (anxiety).    Dispense:  30 tablet    Refill:  0    Medical Decision  Making Problem Points:  Established problem, stable/improving (1), Review of last therapy session (1) and Review of psycho-social stressors (1) Data Points:  Review or order clinical lab tests (1) Review of medication regiment & side effects (2)  I certify that outpatient services furnished can reasonably be expected to improve the patient's condition.   Orson Aloe, MD, [facility]

## 2012-07-28 NOTE — Patient Instructions (Signed)
Keep up the good work.  Take care of yourself.  No one else is standing up to do the job and only you know what you need.   GET SERIOUS about taking care of yourself.  Do the next right thing and that often means doing something to care for yourself along the lines of are you hungry, are you angry, are you lonely, are you tired, are you scared?  [employer] is what that stands for.  Call if problems or concerns.

## 2012-07-30 ENCOUNTER — Ambulatory Visit (HOSPITAL_COMMUNITY): Payer: Self-pay | Admitting: Psychiatry

## 2012-08-11 ENCOUNTER — Telehealth: Payer: Self-pay | Admitting: Internal Medicine

## 2012-08-11 NOTE — Telephone Encounter (Signed)
New problem   Pt needs letter stating she can do cardio & weight training for school

## 2012-08-12 NOTE — Telephone Encounter (Signed)
Is this ok?

## 2012-08-13 ENCOUNTER — Encounter: Payer: Self-pay | Admitting: *Deleted

## 2012-08-13 NOTE — Telephone Encounter (Signed)
Follow-up:    Patient called in wanting to know the status of her letter from Dr. Tenny Craw.  Please call back.

## 2012-08-16 ENCOUNTER — Encounter: Payer: Self-pay | Admitting: *Deleted

## 2012-08-16 NOTE — Telephone Encounter (Signed)
Per Dr. Tenny Craw:  Kristin Stephens to give note of clearance for cardio and weight training.  No weightlifting weights greater than 35lbs.  Letter provided.

## 2012-08-24 ENCOUNTER — Telehealth: Payer: Self-pay | Admitting: *Deleted

## 2012-08-24 NOTE — Telephone Encounter (Signed)
zofran odt #20 called into [facility] and discussed with pt to keep taking otc antidiarrhea meds.

## 2012-08-24 NOTE — Telephone Encounter (Signed)
zofran odt 4 twenty one q 4-6 prn. Stay with otc antidiarrheals

## 2012-08-24 NOTE — Telephone Encounter (Signed)
Pt starting having nausea and diarrhea this am and feeling hot. Taking anti diarrhea pills but not helping. Can something be called into [facility] [place] or does she need to be seen. Pt does not want phenergan because it makes her sleepy.

## 2012-08-25 ENCOUNTER — Encounter (INDEPENDENT_AMBULATORY_CARE_PROVIDER_SITE_OTHER): Payer: Medicaid Other | Admitting: Nurse Practitioner

## 2012-08-25 ENCOUNTER — Encounter: Payer: Self-pay | Admitting: Nurse Practitioner

## 2012-08-26 ENCOUNTER — Encounter: Payer: Self-pay | Admitting: *Deleted

## 2012-08-27 ENCOUNTER — Ambulatory Visit (HOSPITAL_COMMUNITY): Payer: Self-pay | Admitting: Psychiatry

## 2012-08-27 NOTE — Progress Notes (Signed)
This encounter was created in error - please disregard.

## 2012-09-01 ENCOUNTER — Ambulatory Visit (INDEPENDENT_AMBULATORY_CARE_PROVIDER_SITE_OTHER): Payer: Medicaid Other | Admitting: *Deleted

## 2012-09-01 DIAGNOSIS — Z3049 Encounter for surveillance of other contraceptives: Secondary | ICD-10-CM

## 2012-09-01 DIAGNOSIS — Z30013 Encounter for initial prescription of injectable contraceptive: Secondary | ICD-10-CM

## 2012-09-01 MED ORDER — MEDROXYPROGESTERONE ACETATE 150 MG/ML IM SUSP
150.0000 mg | Freq: Once | INTRAMUSCULAR | Status: AC
  Administered 2012-09-01: 150 mg via INTRAMUSCULAR

## 2012-09-03 ENCOUNTER — Ambulatory Visit: Payer: Medicaid Other

## 2012-09-07 ENCOUNTER — Ambulatory Visit: Payer: Self-pay

## 2012-09-10 ENCOUNTER — Ambulatory Visit (INDEPENDENT_AMBULATORY_CARE_PROVIDER_SITE_OTHER): Payer: 59 | Admitting: Psychiatry

## 2012-09-10 DIAGNOSIS — F319 Bipolar disorder, unspecified: Secondary | ICD-10-CM

## 2012-09-13 ENCOUNTER — Ambulatory Visit (HOSPITAL_COMMUNITY): Payer: Self-pay | Admitting: Psychiatry

## 2012-09-13 NOTE — Progress Notes (Signed)
Patient:  Kristin Stephens   DOB: 10/22/1985  MR Number: [id number]  Location: [facility]:  [address] [place] [place],  [place], [address]  Start: Friday 09/10/2012 4:00 PM End: Friday 09/10/2012 4:55 PM  Provider/Observer:     Florencia Reasons, MSW, LCSW   Chief Complaint:      Chief Complaint  Patient presents with  . Anxiety  . Depression    Reason For Service:     The patient is a returning patient to this practice and reports increased symptoms of anxiety and depression. She has a history of bipolar disorder. Patient is seen today for follow up appointment.  Interventions Strategy:  Supportive therapy,cognitive behavioral therapy  Participation Level:   Active  Participation Quality:  Appropriate    Behavioral Observation:  Casual, Alert,  talkative  Current Psychosocial Factors: Patient reports daughter has a seizure disorder.  Content of Session:   Reviewing symptoms, processing feelings, ways to maintain consistency regarding self-care efforts, practicing a mindfulness activity using breath awareness  Current Status:   The patient reports improved mood but continued anxiety  Patient Progress:   Good. The patient reports increased anxiety as her daughter seizures have worsened. She expresses frustration as she states she feels as though she isn't being taken seriously and the pediatrician will not prescribed medication per patient's report. She has made an appointment for her daughter at [facility] for a second opinion. Patient fears potential effects of seizures on daughter's functioning. Therapist works with patient to process her feelings and to validate patient's past and current efforts in seeking care for her child. Patient is doing well in school and now is enrolled in a summer session. She reports good support from her teachers as well as the school counselor. Patient is pleased she hasn't used alcohol in 30 days. She states she was using alcohol about once a month but  knows that was bad since she is on medication. She reports relaxes she needed help to stop using when she attended [employer]. Patient now attends AA and reports this has been very helpful. Therapist works with patient to identify ways to reduce anxiety including developing a schedule and processing organizational skills to achieve balance with responsibilities and self-care efforts. Therapist provides patient with a handout. Therapist also works with patient to practice a mindfulness activity using breath awareness.  Target Goals:   1. Increase understanding of mental disorder and improve self-care ; 1: 1 psychoeducation and cognitive behavior therapy, one time every 1-4 weeks     2. Improve communication skills (decrease yelling) and ability to identify and verbalize feelings; 1-1 psychotherapy ( supportive and cognitive therapy) one time every 1-4 weeks    3. Decrease anxiety and improve relaxation/coping techniques; 1-1 psychotherapy (supportive and cognitive behavioral therapy) one time every 1-4 weeks    4. Increase positive thought patterns and decreased negative thought patterns; 1:1 psychotherapy (supportive and cognitive therapy) one time every 1-4 weeks  Last Reviewed:   01/19/2012  Goals Addressed Today:    Goals 1, 3, and 4  Impression/Diagnosis:   The patient has a long-standing history of symptoms of anxiety and depression along with mood swings beginning in early adolescence. Symptoms have worsened since the birth of her daughter in January 2011. Current symptoms now include anxiety, depressed mood, ruminating thoughts, irritability, mood swings, and crying spells. Diagnosis: bipolar 1 disorder  Diagnosis:  Axis I:  Bipolar disorder          Axis II: Deferred

## 2012-09-13 NOTE — Patient Instructions (Signed)
Discussed orally 

## 2012-09-17 ENCOUNTER — Encounter: Payer: Self-pay | Admitting: Nurse Practitioner

## 2012-09-17 ENCOUNTER — Ambulatory Visit (INDEPENDENT_AMBULATORY_CARE_PROVIDER_SITE_OTHER): Payer: Medicaid Other | Admitting: Nurse Practitioner

## 2012-09-17 VITALS — BP 120/78 | Wt 209.2 lb

## 2012-09-17 DIAGNOSIS — Z3009 Encounter for other general counseling and advice on contraception: Secondary | ICD-10-CM

## 2012-09-17 DIAGNOSIS — F988 Other specified behavioral and emotional disorders with onset usually occurring in childhood and adolescence: Secondary | ICD-10-CM

## 2012-09-17 MED ORDER — AMPHETAMINE-DEXTROAMPHET ER 25 MG PO CP24: 25.0000 mg | ORAL_CAPSULE | ORAL | Status: DC

## 2012-09-17 NOTE — Assessment & Plan Note (Signed)
Increase Adderall XR to 25 mg daily. Recheck in 3 months.

## 2012-09-17 NOTE — Progress Notes (Signed)
Subjective:  Presents for recheck on her ADD. Still having some slight distraction on Adderall XR 20 mg daily. Unsure whether this is her anxiety or her ADD symptoms. Still in school. Takes occasional Adderall 10 mg in the afternoon with school work and studying. Does not take this every day. Denies any insomnia. No adverse effects from Adderall. Also requesting referral to gynecology to discuss permanent contraception such as tubal ligation.  Objective:   BP 120/78  Wt 209 lb 3.2 oz (94.892 kg)  BMI 34.27 kg/m2 NAD. Alert, oriented. Lungs clear. Heart regular rate rhythm.  Assessment:ATTENTION DEFICIT DISORDER, ADULT  Other general counseling and advice for contraceptive management - Plan: Ambulatory referral to Gynecology  Plan: Meds ordered this encounter  Medications  . amphetamine-dextroamphetamine (ADDERALL XR) 25 MG 24 hr capsule    Sig: Take 1 capsule (25 mg total) by mouth every morning.    Dispense:  30 capsule    Refill:  0    Order Specific Question:  Supervising Provider    Answer:  Merlyn Albert [[id number]]   Patient to call back in a few weeks and let us know if this dosage is working better, sooner if any problems. Routine followup in 3 months.

## 2012-09-24 ENCOUNTER — Other Ambulatory Visit: Payer: Self-pay | Admitting: Nurse Practitioner

## 2012-09-24 ENCOUNTER — Telehealth: Payer: Self-pay | Admitting: Nurse Practitioner

## 2012-09-24 MED ORDER — AMPHETAMINE-DEXTROAMPHET ER 20 MG PO CP24: 20.0000 mg | ORAL_CAPSULE | ORAL | Status: DC

## 2012-09-24 MED ORDER — AMPHETAMINE-DEXTROAMPHET ER 5 MG PO CP24: 5.0000 mg | ORAL_CAPSULE | ORAL | Status: DC

## 2012-09-24 NOTE — Progress Notes (Signed)
Patient brought back original Rx from 09/17/12.  Advised by pharmacist that Adderall XR 25 mg is on back order and that new Medicaid formulary will only pay for name brand.  Given new Rx with changes.

## 2012-09-24 NOTE — Telephone Encounter (Signed)
Called pt to pick up script.

## 2012-09-24 NOTE — Telephone Encounter (Signed)
Pt is out of this med, she went to refill it but was told Medicaid will only do Name Brand on her Adderall 25 mg. Pharmacist says you can rewrite her for a 20 mg and 5 mg they can fill that, they don't carry the adderall 25 mg [facility] (see chart for original script)

## 2012-10-11 ENCOUNTER — Emergency Department (HOSPITAL_COMMUNITY)
Admission: EM | Admit: 2012-10-11 | Discharge: 2012-10-11 | Disposition: A | Discharge status: Home / Self Care | Payer: Medicaid Other | Attending: Emergency Medicine | Admitting: Emergency Medicine

## 2012-10-11 ENCOUNTER — Encounter (HOSPITAL_COMMUNITY): Payer: Self-pay

## 2012-10-11 ENCOUNTER — Emergency Department (HOSPITAL_COMMUNITY): Payer: Medicaid Other

## 2012-10-11 DIAGNOSIS — R109 Unspecified abdominal pain: Secondary | ICD-10-CM | POA: Insufficient documentation

## 2012-10-11 DIAGNOSIS — R1110 Vomiting, unspecified: Secondary | ICD-10-CM | POA: Insufficient documentation

## 2012-10-11 DIAGNOSIS — R112 Nausea with vomiting, unspecified: Secondary | ICD-10-CM | POA: Insufficient documentation

## 2012-10-11 DIAGNOSIS — F17200 Nicotine dependence, unspecified, uncomplicated: Secondary | ICD-10-CM | POA: Insufficient documentation

## 2012-10-11 DIAGNOSIS — I10 Essential (primary) hypertension: Secondary | ICD-10-CM | POA: Insufficient documentation

## 2012-10-11 DIAGNOSIS — G43909 Migraine, unspecified, not intractable, without status migrainosus: Secondary | ICD-10-CM | POA: Insufficient documentation

## 2012-10-11 DIAGNOSIS — Z79899 Other long term (current) drug therapy: Secondary | ICD-10-CM | POA: Insufficient documentation

## 2012-10-11 DIAGNOSIS — Z3202 Encounter for pregnancy test, result negative: Secondary | ICD-10-CM | POA: Insufficient documentation

## 2012-10-11 DIAGNOSIS — K219 Gastro-esophageal reflux disease without esophagitis: Secondary | ICD-10-CM | POA: Insufficient documentation

## 2012-10-11 DIAGNOSIS — F329 Major depressive disorder, single episode, unspecified: Secondary | ICD-10-CM | POA: Insufficient documentation

## 2012-10-11 DIAGNOSIS — Z8719 Personal history of other diseases of the digestive system: Secondary | ICD-10-CM | POA: Insufficient documentation

## 2012-10-11 DIAGNOSIS — Z8709 Personal history of other diseases of the respiratory system: Secondary | ICD-10-CM | POA: Insufficient documentation

## 2012-10-11 DIAGNOSIS — Z872 Personal history of diseases of the skin and subcutaneous tissue: Secondary | ICD-10-CM | POA: Insufficient documentation

## 2012-10-11 DIAGNOSIS — J45909 Unspecified asthma, uncomplicated: Secondary | ICD-10-CM | POA: Insufficient documentation

## 2012-10-11 DIAGNOSIS — F411 Generalized anxiety disorder: Secondary | ICD-10-CM | POA: Insufficient documentation

## 2012-10-11 DIAGNOSIS — F909 Attention-deficit hyperactivity disorder, unspecified type: Secondary | ICD-10-CM | POA: Insufficient documentation

## 2012-10-11 DIAGNOSIS — F3289 Other specified depressive episodes: Secondary | ICD-10-CM | POA: Insufficient documentation

## 2012-10-11 LAB — CBC WITH DIFFERENTIAL/PLATELET
Basophils Absolute: 0 10*3/uL (ref 0.0–0.1)
Basophils Relative: 0 % (ref 0–1)
MCHC: 34.7 g/dL (ref 30.0–36.0)
Neutro Abs: 6.7 10*3/uL (ref 1.7–7.7)
Neutrophils Relative %: 82 % — ABNORMAL HIGH (ref 43–77)
Platelets: 299 10*3/uL (ref 150–400)
RDW: 13.6 % (ref 11.5–15.5)

## 2012-10-11 LAB — BASIC METABOLIC PANEL
Chloride: 107 mEq/L (ref 96–112)
GFR calc Af Amer: >90 mL/min (ref >90)
GFR calc non Af Amer: >90 mL/min (ref >90)
Potassium: 3.8 mEq/L (ref 3.5–5.1)

## 2012-10-11 LAB — URINALYSIS, ROUTINE W REFLEX MICROSCOPIC
Glucose, UA: NEGATIVE mg/dL
Leukocytes, UA: NEGATIVE
Nitrite: NEGATIVE
Protein, ur: NEGATIVE mg/dL
Urobilinogen, UA: 0.2 mg/dL (ref 0.0–1.0)

## 2012-10-11 LAB — PREGNANCY, URINE: Preg Test, Ur: NEGATIVE

## 2012-10-11 LAB — LIPASE, BLOOD: Lipase: 26 U/L (ref 11–59)

## 2012-10-11 LAB — URINE MICROSCOPIC-ADD ON

## 2012-10-11 MED ORDER — ONDANSETRON HCL 4 MG/2ML IJ SOLN
INTRAMUSCULAR | Status: AC
  Filled 2012-10-11: qty 2

## 2012-10-11 MED ORDER — ONDANSETRON HCL 4 MG/2ML IJ SOLN
4.0000 mg | Freq: Once | INTRAMUSCULAR | Status: AC
  Administered 2012-10-11: 4 mg via INTRAVENOUS

## 2012-10-11 MED ORDER — MORPHINE SULFATE 4 MG/ML IJ SOLN
2.0000 mg | Freq: Once | INTRAMUSCULAR | Status: AC
  Administered 2012-10-11: 2 mg via INTRAVENOUS
  Filled 2012-10-11: qty 1

## 2012-10-11 MED ORDER — ONDANSETRON HCL 4 MG/2ML IJ SOLN
4.0000 mg | Freq: Once | INTRAMUSCULAR | Status: AC
  Administered 2012-10-11: 4 mg via INTRAVENOUS
  Filled 2012-10-11: qty 2

## 2012-10-11 NOTE — ED Provider Notes (Signed)
History    CSN: [id number] Arrival date & time 10/11/12  0317  First MD Initiated Contact with Patient 10/11/12 0345     Chief Complaint  Patient presents with  . Abdominal Pain   (Consider location/radiation/quality/duration/timing/severity/associated sxs/prior Treatment) HPI HPI Comments: Kristin Stephens is a 27 y.o. female who presents to the Emergency Department complaining of abdominal pain that has been present since Saturday which associated vomiting that began yesterday. Abdominal pain has been sharp and stabbing pain to the mid abdomen. Last BM was yesterday and was normal. Denies fever, chills, diarrhea, shortness of breath, cough.   PCP Dr. Gerda Diss Past Medical History  Diagnosis Date  . Esophageal reflux   . IBS (irritable bowel syndrome)   . Anxiety   . Depression   . Bipolar affective disorder   . TMJ syndrome   . Asthma     daily and prn inhalers  . Deviated nasal septum 05/2012  . Nasal turbinate hypertrophy 05/2012    bilateral  . Dental crowns present   . Eczema     right leg  . Migraine headache   . Hypertension   . ADHD (attention deficit hyperactivity disorder)    Past Surgical History  Procedure Laterality Date  . Asd and vsd repair  1989  . Coarctation of aorta repair  1989  . Cesarean section  04/12/2009  . Esophagogastroduodenoscopy  04/04/2008      Normal esophagus/Mild patchy erythema in the antrum/ Normal duodenal bulb, normal small bowel biopsy  . Flexible sigmoidoscopy  04/04/2008      Small internal hemorrhoids (poor bowel prep)  . Cardiac catheterization  06/18/2006  . Nasal septoplasty w/ turbinoplasty Bilateral 05/31/2012    Procedure: NASAL SEPTOPLASTY WITH TURBINATE REDUCTION;  Surgeon: Darletta Moll, MD;  Location: [facility];  Service: ENT;  Laterality: Bilateral;   Family History  Problem Relation Age of Onset  . Hyperlipidemia Mother   . Hypertension Mother   . Depression Mother   . Physical abuse Mother   .  Sexual abuse Mother     possibly  . Emphysema Paternal Grandmother   . Heart disease Paternal Grandmother   . Bipolar disorder Paternal Grandmother   . Dementia Paternal Grandmother   . Heart disease Maternal Grandmother   . Dementia Maternal Grandmother   . Stroke Maternal Grandfather   . Prostate cancer Maternal Grandfather   . Personality disorder Father   . Bipolar disorder Father   . Alcohol abuse Father   . ADD / ADHD Sister   . Anxiety disorder Brother   . ADD / ADHD Brother   . ADD / ADHD Brother   . Seizures Daughter   . Drug abuse Neg Hx   . OCD Neg Hx   . Paranoid behavior Neg Hx   . Schizophrenia Neg Hx    History  Substance Use Topics  . Smoking status: Light Tobacco Smoker -- 0.50 packs/day for 10 years    Types: Cigarettes    Last Attempt to Quit: 04/01/2011  . Smokeless tobacco: Former Neurosurgeon    Quit date: 05/01/2012     Comment: 3-6 cigs a day during for finals week as of 07/18/2012  . Alcohol Use: Yes     Comment: 1-2 x/month   OB History   Grav Para Term Preterm Abortions TAB SAB Ect Mult Living                 Review of Systems  Constitutional: Negative for fever.  10 Systems reviewed and are negative for acute change except as noted in the HPI.  HENT: Negative for congestion.   Eyes: Negative for discharge and redness.  Respiratory: Negative for cough and shortness of breath.   Cardiovascular: Negative for chest pain.  Gastrointestinal: Positive for nausea, vomiting and abdominal pain.  Musculoskeletal: Negative for back pain.  Skin: Negative for rash.  Neurological: Negative for syncope, numbness and headaches.  Psychiatric/Behavioral:       No behavior change.    Allergies  Sulfonamide derivatives and Coconut flavor  Home Medications   Current Outpatient Rx  Name  Route  Sig  Dispense  Refill  . albuterol (PROVENTIL HFA;VENTOLIN HFA) 108 (90 BASE) MCG/ACT inhaler   Inhalation   Inhale 2 puffs into the lungs every 6 (six) hours as  needed for wheezing.   1 Inhaler   6   . albuterol (PROVENTIL HFA;VENTOLIN HFA) 108 (90 BASE) MCG/ACT inhaler   Inhalation   Inhale 1-2 puffs into the lungs every 6 (six) hours as needed for wheezing.   1 Inhaler   0   . ALPRAZolam (XANAX) 0.5 MG tablet   Oral   Take 1 tablet (0.5 mg total) by mouth at bedtime as needed (anxiety).   30 tablet   0   . amphetamine-dextroamphetamine (ADDERALL XR) 20 MG 24 hr capsule   Oral   Take 1 capsule (20 mg total) by mouth every morning.   30 capsule   0     Dispense name brand only   . beclomethasone (QVAR) 80 MCG/ACT inhaler   Inhalation   Inhale 2 puffs into the lungs 2 (two) times daily.   1 Inhaler   6   . BIOTIN PO   Oral   Take by mouth.         . citalopram (CELEXA) 40 MG tablet   Oral   Take 1 tablet (40 mg total) by mouth daily.   30 tablet   2   . dexlansoprazole (DEXILANT) 60 MG capsule   Oral   Take 1 capsule (60 mg total) by mouth daily.   31 capsule   11   . lamoTRIgine (LAMICTAL) 100 MG tablet   Oral   Take 1 tablet (100 mg total) by mouth daily.   30 tablet   2   . medroxyPROGESTERone (DEPO-PROVERA) 150 MG/ML injection   Intramuscular   Inject 150 mg into the muscle every 3 (three) months.         Marland Kitchen amphetamine-dextroamphetamine (ADDERALL XR) 5 MG 24 hr capsule   Oral   Take 1 capsule (5 mg total) by mouth every morning.   30 capsule   0     Dispense name brand only   . amphetamine-dextroamphetamine (ADDERALL) 10 MG tablet   Oral   Take 10 mg by mouth daily.          BP 119/86  Pulse 92  Resp 22  Ht 5\' 5"  (1.651 m)  Wt 205 lb (92.987 kg)  BMI 34.11 kg/m2  SpO2 97% Physical Exam  Nursing note and vitals reviewed. Constitutional: She appears well-developed and well-nourished.  Awake, alert, nontoxic appearance.  HENT:  Head: Normocephalic and atraumatic.  Right Ear: External ear normal.  Left Ear: External ear normal.  Eyes: EOM are normal. Pupils are equal, round, and  reactive to light.  Neck: Neck supple.  Cardiovascular: Normal rate and intact distal pulses.   Pulmonary/Chest: Effort normal and breath sounds normal. She exhibits no tenderness.  Abdominal: Soft. There is tenderness. There is no rebound.  Tenderness to mid abdomen with palpation  Musculoskeletal: She exhibits no tenderness.  Baseline ROM, no obvious new focal weakness.  Neurological:  Mental status and motor strength appears baseline for patient and situation.  Skin: No rash noted.  Psychiatric: She has a normal mood and affect.    ED Course  Procedures (including critical care time) Results for orders placed during the hospital encounter of 10/11/12  URINALYSIS, ROUTINE W REFLEX MICROSCOPIC      Result Value Range   Color, Urine YELLOW  YELLOW   APPearance CLEAR  CLEAR   Specific Gravity, Urine 1.025  1.005 - 1.030   pH 6.5  5.0 - 8.0   Glucose, UA NEGATIVE  NEGATIVE mg/dL   Hgb urine dipstick TRACE (*) NEGATIVE   Bilirubin Urine SMALL (*) NEGATIVE   Ketones, ur 15 (*) NEGATIVE mg/dL   Protein, ur NEGATIVE  NEGATIVE mg/dL   Urobilinogen, UA 0.2  0.0 - 1.0 mg/dL   Nitrite NEGATIVE  NEGATIVE   Leukocytes, UA NEGATIVE  NEGATIVE  PREGNANCY, URINE      Result Value Range   Preg Test, Ur NEGATIVE  NEGATIVE  CBC WITH DIFFERENTIAL      Result Value Range   WBC 8.1  4.0 - 10.5 K/uL   RBC 4.67  3.87 - 5.11 MIL/uL   Hemoglobin 13.6  12.0 - 15.0 g/dL   HCT 78.2  95.6 - 21.3 %   MCV 83.9  78.0 - 100.0 fL   MCH 29.1  26.0 - 34.0 pg   MCHC 34.7  30.0 - 36.0 g/dL   RDW 08.6  57.8 - 46.9 %   Platelets 299  150 - 400 K/uL   Neutrophils Relative % 82 (*) 43 - 77 %   Neutro Abs 6.7  1.7 - 7.7 K/uL   Lymphocytes Relative 12  12 - 46 %   Lymphs Abs 1.0  0.7 - 4.0 K/uL   Monocytes Relative 5  3 - 12 %   Monocytes Absolute 0.4  0.1 - 1.0 K/uL   Eosinophils Relative 0  0 - 5 %   Eosinophils Absolute 0.0  0.0 - 0.7 K/uL   Basophils Relative 0  0 - 1 %   Basophils Absolute 0.0  0.0 -  0.1 K/uL  BASIC METABOLIC PANEL      Result Value Range   Sodium 141  135 - 145 mEq/L   Potassium 3.8  3.5 - 5.1 mEq/L   Chloride 107  96 - 112 mEq/L   CO2 24  19 - 32 mEq/L   Glucose, Bld 120 (*) 70 - 99 mg/dL   BUN 5 (*) 6 - 23 mg/dL   Creatinine, Ser 6.29  0.50 - 1.10 mg/dL   Calcium 9.1  8.4 - 52.8 mg/dL   GFR calc non Af Amer >90  >90 mL/min   GFR calc Af Amer >90  >90 mL/min  LIPASE, BLOOD      Result Value Range   Lipase 26  11 - 59 U/L  URINE MICROSCOPIC-ADD ON      Result Value Range   Squamous Epithelial / LPF MANY (*) RARE   WBC, UA 0-2  <3 WBC/hpf   RBC / HPF 0-2  <3 RBC/hpf   Bacteria, UA MANY (*) RARE   Dg Abd Acute W/chest  10/11/2012   *RADIOLOGY REPORT*  Clinical Data: Abdominal pain and vomiting.  ACUTE ABDOMEN SERIES (ABDOMEN 2 VIEW & CHEST  1 VIEW)  Comparison: Chest radiograph performed 07/01/2012  Findings: The lungs are well-aerated and clear.  There is no evidence of focal opacification, pleural effusion or pneumothorax. The cardiomediastinal silhouette is borderline normal in size; scattered postoperative change is noted about the aortic arch.  The visualized bowel gas pattern is unremarkable.  Scattered stool and air are seen within the colon; there is no evidence of small bowel dilatation to suggest obstruction.  No free intra-abdominal air is identified on the provided upright view.  No acute osseous abnormalities are seen; the sacroiliac joints are unremarkable in appearance.  IMPRESSION:  1.  Unremarkable bowel gas pattern; no free intra-abdominal air seen. 2.  No acute cardiopulmonary process identified.   Original Report Authenticated By: Tonia Ghent, M.D.   Medications  ondansetron (ZOFRAN) injection 4 mg (4 mg Intravenous Given 10/11/12 0328)  morphine 4 MG/ML injection 2 mg (2 mg Intravenous Given 10/11/12 0514)  ondansetron (ZOFRAN) injection 4 mg (4 mg Intravenous Given 10/11/12 0511)   MDM  Patient who presents with abdominal pain and vomiting that  began tonight. Blood work is normal. Xray unremarkable. Given zofran x 2 and morphine with relief. Discharged home. Pt stable in ED with no significant deterioration in condition.The patient appears reasonably screened and/or stabilized for discharge and I doubt any other medical condition or other [facility] requiring further screening, evaluation, or treatment in the ED at this time prior to discharge.  MDM Reviewed: nursing note and vitals Interpretation: labs and x-ray     Nicoletta Dress. Colon Branch, MD 10/20/12 [phone]

## 2012-10-11 NOTE — ED Notes (Signed)
Pt given discharge instruction, verbalized understand, pt calling for a ride.

## 2012-10-11 NOTE — ED Notes (Signed)
Pt states pain is better, but she is still nauseated

## 2012-10-11 NOTE — ED Notes (Signed)
Pt actively vomiting in room. At this time. Pt complaining of right upper quadrant pain.

## 2012-10-11 NOTE — ED Notes (Signed)
abd pain onset early Saturday, worse tonight, states vomiting at home

## 2012-10-14 ENCOUNTER — Encounter: Payer: Self-pay | Admitting: Nurse Practitioner

## 2012-10-14 ENCOUNTER — Ambulatory Visit (INDEPENDENT_AMBULATORY_CARE_PROVIDER_SITE_OTHER): Payer: Medicaid Other | Admitting: Nurse Practitioner

## 2012-10-14 VITALS — BP 122/74 | Wt 208.6 lb

## 2012-10-14 DIAGNOSIS — F3289 Other specified depressive episodes: Secondary | ICD-10-CM

## 2012-10-14 DIAGNOSIS — F411 Generalized anxiety disorder: Secondary | ICD-10-CM

## 2012-10-14 DIAGNOSIS — K5900 Constipation, unspecified: Secondary | ICD-10-CM

## 2012-10-14 DIAGNOSIS — F329 Major depressive disorder, single episode, unspecified: Secondary | ICD-10-CM

## 2012-10-14 MED ORDER — LAMOTRIGINE 100 MG PO TABS: ORAL_TABLET | ORAL | Status: DC

## 2012-10-14 NOTE — Patient Instructions (Signed)
Fleets enema  Magnesium citrate

## 2012-10-15 NOTE — Assessment & Plan Note (Signed)
Medications  . lamoTRIgine (LAMICTAL) 100 MG tablet    Sig: 1 1/2 tabs po qd    Dispense:  45 tablet    Refill:  5    Order Specific Question:  Supervising Provider    Answer:  Merlyn Albert [[id number]]   increase Lamictal, continue other medications as directed. Hopefully she will be able to restart psychiatric care in the near future. Continue counseling. Call back if any problems, seek emergency care if symptoms worsen. Discussed importance of stress reduction.

## 2012-10-15 NOTE — Assessment & Plan Note (Signed)
Medications  . lamoTRIgine (LAMICTAL) 100 MG tablet    Sig: 1 1/2 tabs po qd    Dispense:  45 tablet    Refill:  5    Order Specific Question:  Supervising Provider    Answer:  LUKING, WILLIAM S [[id number]]   increase Lamictal, continue other medications as directed. Hopefully she will be able to restart psychiatric care in the near future. Continue counseling. Call back if any problems, seek emergency care if symptoms worsen. Discussed importance of stress reduction. 

## 2012-10-15 NOTE — Progress Notes (Signed)
Subjective:  Patient presents for recheck. Has experienced more depression lately, has been under tremendous amounts of stress due to her daughter's seizure disorder. Feels like she is starting to get some of her energy back. Continues mental health counseling. Dr. Dan Humphreys her psychiatrist is no longer with his practice. She is been told it may be several weeks before they have another psychiatrist. Denies suicidal or homicidal thoughts or ideation. Has taken Lamictal 150 mg without difficulty in the past. Also complaints of constipation lately, no BM for the past 3 days. Mild cramping, no severe abdominal pain. No blood in her stool. Has stopped taking her probiotic yogurt, also changes in her diet.  Objective:   BP 122/74  Wt 208 lb 9.6 oz (94.62 kg)  BMI 34.71 kg/m2 NAD. Alert, oriented. Calm affect. Crying once during office visit when talking about her daughter. Thoughts logical coherent and relevant. Lungs clear. Heart regular rate rhythm. Abdomen obese nondistended with active bowel sounds x4, nontender to palpation. No obvious masses.  Assessment:DEPRESSION  Unspecified constipation  ANXIETY  Plan: Meds ordered this encounter  Medications  . lamoTRIgine (LAMICTAL) 100 MG tablet    Sig: 1 1/2 tabs po qd    Dispense:  45 tablet    Refill:  5    Order Specific Question:  Supervising Provider    Answer:  Merlyn Albert [[id number]]   increase Lamictal, continue other medications as directed. Hopefully she will be able to restart psychiatric care in the near future. Continue counseling. Call back if any problems, seek emergency care if symptoms worsen. Discussed importance of stress reduction. Also restart her probiotics and continue MiraLAX, discussed OTC options for severe constipation including fleets enema and magnesium citrate. Call back next week if no improvement.

## 2012-10-18 ENCOUNTER — Ambulatory Visit (INDEPENDENT_AMBULATORY_CARE_PROVIDER_SITE_OTHER): Payer: Medicaid Other | Admitting: Nurse Practitioner

## 2012-10-18 ENCOUNTER — Encounter: Payer: Self-pay | Admitting: Nurse Practitioner

## 2012-10-18 ENCOUNTER — Telehealth: Payer: Self-pay | Admitting: Family Medicine

## 2012-10-18 ENCOUNTER — Telehealth: Payer: Self-pay | Admitting: Nurse Practitioner

## 2012-10-18 VITALS — BP 132/98 | Temp 98.9°F | Wt 208.4 lb

## 2012-10-18 DIAGNOSIS — F988 Other specified behavioral and emotional disorders with onset usually occurring in childhood and adolescence: Secondary | ICD-10-CM

## 2012-10-18 DIAGNOSIS — J069 Acute upper respiratory infection, unspecified: Secondary | ICD-10-CM

## 2012-10-18 MED ORDER — AMOXICILLIN-POT CLAVULANATE 875-125 MG PO TABS: 1.0000 | ORAL_TABLET | Freq: Two times a day (BID) | ORAL | Status: DC

## 2012-10-18 MED ORDER — AMPHETAMINE-DEXTROAMPHET ER 25 MG PO CP24: 25.0000 mg | ORAL_CAPSULE | ORAL | Status: DC

## 2012-10-18 MED ORDER — AMPHETAMINE-DEXTROAMPHETAMINE 10 MG PO TABS: 10.0000 mg | ORAL_TABLET | Freq: Every day | ORAL | Status: DC

## 2012-10-18 NOTE — Telephone Encounter (Signed)
Left message on voicemail to return call.

## 2012-10-18 NOTE — Telephone Encounter (Signed)
Pt calling to ask about her coughing up green/yellow mucous all weekend. No fever, no other symptoms other than a slight stuffy nose. [facility]

## 2012-10-18 NOTE — Telephone Encounter (Signed)
Transferred to front desk to schedule appt.

## 2012-10-18 NOTE — Telephone Encounter (Signed)
Pt left a message on my voice mail on 10/15/12 stating that she needs a new psychiatrist.  I have mailed her a letter explaining that those providers can be hard to find, gave her the names and phone numbers of a couple places that at one time were accepting Medicaid adult patients.  She will need to call to set up.

## 2012-10-19 ENCOUNTER — Encounter: Payer: Self-pay | Admitting: Nurse Practitioner

## 2012-10-19 NOTE — Assessment & Plan Note (Signed)
Continue Adderall XR 25 mg every morning and Adderall 10 mg in the afternoon.

## 2012-10-19 NOTE — Progress Notes (Signed)
Subjective:  Presents for complaints of head congestion and cough for the past several days. Cough mainly in the morning, producing green-yellow mucus. No fever. No headache. No sore throat. No runny nose. Slight ear pain. No vomiting diarrhea or abdominal pain. No wheezing. Of note her increase Adderall XR to 25 mg per day is working well. Also taking 10 mg in the afternoon while she is in school.  Objective:   BP 132/98  Temp(Src) 98.9 F (37.2 C) (Oral)  Wt 208 lb 6.4 oz (94.53 kg)  BMI 34.68 kg/m2 NAD. Alert, oriented. TMs clear effusion, no erythema. Pharynx injected with green PND noted. Neck supple with mild soft slightly tender adenopathy. Lungs clear. Heart regular rate rhythm.  Assessment:Acute upper respiratory infections of unspecified site  ATTENTION DEFICIT DISORDER, ADULT   Plan: Augmentin 875 mg 1 by mouth twice a day x10 days. OTC meds as directed for congestion and cough. Call back by the end of the week if no improvement. Also patient given 3 separate monthly 30 day prescriptions for Adderall XR 25 mg daily and Adderall 10 mg each afternoon. Followup in 3 months, sooner if any problems.

## 2012-10-27 ENCOUNTER — Ambulatory Visit (HOSPITAL_COMMUNITY): Payer: Self-pay | Admitting: Psychiatry

## 2012-11-03 ENCOUNTER — Telehealth: Payer: Self-pay | Admitting: Family Medicine

## 2012-11-03 MED ORDER — AMPHETAMINE-DEXTROAMPHET ER 5 MG PO CP24: 5.0000 mg | ORAL_CAPSULE | ORAL | Status: DC

## 2012-11-03 MED ORDER — AMPHETAMINE-DEXTROAMPHET ER 20 MG PO CP24: 20.0000 mg | ORAL_CAPSULE | ORAL | Status: DC

## 2012-11-03 NOTE — Telephone Encounter (Signed)
Rxs printed and left up front for patient pick up. Patient notified. 

## 2012-11-03 NOTE — Telephone Encounter (Signed)
Patient needs Rx wrote for Adderall 20 mg ER and 5 mg because pharmacy dont have the 25 mg yet. She would like to pick this up by today. She turned in her 25 mg.

## 2012-11-03 NOTE — Telephone Encounter (Signed)
FYI it must be brand name for medicaid to pay

## 2012-11-03 NOTE — Telephone Encounter (Signed)
Kristin Stephens gave a rx for AdderallXR 25 mg   The pharm dont have it -Patient needs 2 separate rxs for 20mg  xr and 5 mg xr-   Patient returned the original rx

## 2012-11-03 NOTE — Telephone Encounter (Signed)
Say what? Verify with pt what is needed,secondly just a week/10 days?

## 2012-11-03 NOTE — Telephone Encounter (Signed)
Please give as requested (fyi-we can do nothing about double copays)

## 2012-11-18 ENCOUNTER — Telehealth: Payer: Self-pay | Admitting: Nurse Practitioner

## 2012-11-18 ENCOUNTER — Other Ambulatory Visit: Payer: Self-pay | Admitting: Nurse Practitioner

## 2012-11-18 MED ORDER — CLOBETASOL PROPIONATE 0.05 % EX CREA: TOPICAL_CREAM | Freq: Two times a day (BID) | CUTANEOUS | Status: DC

## 2012-11-18 NOTE — Telephone Encounter (Signed)
Pt has rash from beach, was told possible [employer], OTC is not working can you call in something stronger. [facility]

## 2012-11-22 ENCOUNTER — Ambulatory Visit (HOSPITAL_COMMUNITY): Payer: Self-pay | Admitting: Psychiatry

## 2012-11-25 ENCOUNTER — Ambulatory Visit (INDEPENDENT_AMBULATORY_CARE_PROVIDER_SITE_OTHER): Payer: Medicaid Other

## 2012-11-25 ENCOUNTER — Ambulatory Visit (INDEPENDENT_AMBULATORY_CARE_PROVIDER_SITE_OTHER): Payer: 59 | Admitting: Psychiatry

## 2012-11-25 ENCOUNTER — Encounter (HOSPITAL_COMMUNITY): Payer: Self-pay | Admitting: Psychiatry

## 2012-11-25 VITALS — BP 140/80 | Ht 65.0 in | Wt 207.0 lb

## 2012-11-25 DIAGNOSIS — Z30013 Encounter for initial prescription of injectable contraceptive: Secondary | ICD-10-CM

## 2012-11-25 DIAGNOSIS — F988 Other specified behavioral and emotional disorders with onset usually occurring in childhood and adolescence: Secondary | ICD-10-CM

## 2012-11-25 DIAGNOSIS — Z3049 Encounter for surveillance of other contraceptives: Secondary | ICD-10-CM

## 2012-11-25 DIAGNOSIS — F319 Bipolar disorder, unspecified: Secondary | ICD-10-CM

## 2012-11-25 DIAGNOSIS — Z3009 Encounter for other general counseling and advice on contraception: Secondary | ICD-10-CM

## 2012-11-25 MED ORDER — AMPHETAMINE-DEXTROAMPHET ER 10 MG PO CP24: 30.0000 mg | ORAL_CAPSULE | Freq: Every day | ORAL | Status: DC

## 2012-11-25 MED ORDER — AMPHETAMINE-DEXTROAMPHETAMINE 10 MG PO TABS: 10.0000 mg | ORAL_TABLET | Freq: Every day | ORAL | Status: DC

## 2012-11-25 MED ORDER — MEDROXYPROGESTERONE ACETATE 150 MG/ML IM SUSP
150.0000 mg | Freq: Once | INTRAMUSCULAR | Status: AC
  Administered 2012-11-25: 150 mg via INTRAMUSCULAR

## 2012-11-25 MED ORDER — LAMOTRIGINE 200 MG PO TABS: 200.0000 mg | ORAL_TABLET | Freq: Every day | ORAL | Status: DC

## 2012-11-25 MED ORDER — ALPRAZOLAM 0.5 MG PO TABS: 0.5000 mg | ORAL_TABLET | Freq: Every evening | ORAL | Status: DC | PRN

## 2012-11-25 MED ORDER — CITALOPRAM HYDROBROMIDE 40 MG PO TABS: 40.0000 mg | ORAL_TABLET | Freq: Every day | ORAL | Status: DC

## 2012-11-25 NOTE — Progress Notes (Signed)
Patient ID: Kristin Stephens, female   DOB: 1985/11/13, 27 y.o.   MRN: [id number] [facility] [address] Progress Note IRIE DOWSON MRN: [id number] DOB: [birth year] Age: 27 y.o.  Date: 11/25/2012 Start Time: 9:02 AM End Time: 9:22 AM  Chief Complaint: Chief Complaint  Patient presents with  . Depression  . Follow-up  . Medication Refill   Subjective: "I'm still having some depressed times."  This patient is a 27 year old single black female who lives with her mother and her 77 and a half-year-old daughter. She still dating the father of her baby. She is a Physicist, medical at [employer].  The patient states that she initially got depressed around age 52. She saw Peggy for counseling for a long time. Her teen years she double her depression by using alcohol marijuana and prescription pills she was also getting into self-mutilation. She was never psychiatrically hospitalized  The patient did well while she was pregnant with her daughter at age 39. However shortly after giving birth she got extremely depressed, she was unable to care for herself or the baby and had absolutely no interest in anything. She also began developing manic symptoms at times when she was very hyperactive "high", and didn't sleep or eat and was hyperalert. The physician had diagnosed her as being bipolar and she was placed on Lamictal.  The patient also has a history of ADHD that has been present since childhood. Adderall has been effective for this but she's getting it from a nurse practitioner and primary care which I don't think it's a good idea. Currently the patient is going through a difficult time her uncle has multiple myeloma and he has been like a father for her. She's afraid of going back into a severe depression and I agree that we need to try to prevent this. Currently she is fairly functional but I told her we need to boost up her Lamictal dose and  try to consolidate her Adderall XR. She denies any suicidal ideation right now   Current psychiatric medication Lamictal 150 mg daily Celexa 40 mg daily Adderall xr 25 mg every morning and Adderall 10 mg after school Xanax  0.5 mg each bedtime  Past psychiatric history Patient has been seeing in this office since April 2012 by Dr Lolly Mustache however she has been seeing therapist in this office for more than 10 years.  Patient endorse history of mood swing, depression, anger and poor attention and concentration.  Patient also endorse history of self abusive behavior and endorse cutting herself with a razor blade when her grandmother was dying.  In the past she was prescribed Wellbutrin but she was admitted on medical for the she never took the Wellbutrin.  Patient denies any history of suicidal attempt or any inpatient psychiatric treatment.  She had history of burst of energy with excessive shopping, cleaning, road rage and impulse eating.   Psychosocial history Patient was raised by her mother and grandmother when her father left when she was only 51 years old.  Patient told her father was never in her life and she was very close to her grandmother.  Patient has a lot of resentment and anger towards her father.  Patient grandmother died when she was only 33 years old.  Patient has one daughter.  Patient has a very good relationship with her boyfriend whose been very supportive.  Family history family history includes ADD / ADHD in her brother, brother, and sister; Alcohol abuse  in her father; Anxiety disorder in her brother; Bipolar disorder in her father and paternal grandmother; Dementia in her maternal grandmother and paternal grandmother; Depression in her mother; Emphysema in her paternal grandmother; Heart disease in her maternal grandmother and paternal grandmother; Hyperlipidemia in her mother; Hypertension in her mother; Personality disorder in her father; Physical abuse in her mother; Prostate  cancer in her maternal grandfather; Seizures in her daughter; Sexual abuse in her mother; Stroke in her maternal grandfather. There is no history of Drug abuse, OCD, Paranoid behavior, or Schizophrenia.  Alcohol and substance use history Patient endorse history of using marijuana and drugs and alcohol in her teens when she was self-medicating for her depression and anxiety.  However she began drinking heavily again in the last several months, particularly on weekends. She quit 90 days ago and is attending AA.  Patient denies any history of DWI, tremors or blackout.  Education and work history Patient has GED and currently she is enrolled in [employer]  She is hoping to finish her schooling in 2015.  Patient has a history of attention and concentration problem which was formally tested by psychologist in this office.    Medical history Patient has history of headache, GERD, allergic rhinitis, hypertension, irritable bowel syndrome and aortic Valve repaired.    Mental status examination Patient is well groomed and casually dressed. She is calm and cooperative.  She described her mood is better and her affect is improved from the past she is worried and depressed about her uncles cancer   She denies any active or passive suicidal thoughts or homicidal thoughts.  Her speech is fast but clear and coherent.  Her thought process is logical linear and goal-directed.  There no psychotic symptoms present at this time.  Her attention and concentration this could.  She's oriented x3.  Her insight judgment and impulse control is okay.  Lab Results:  Results for orders placed during the hospital encounter of 10/11/12 (from the past 8736 hour(s))  URINALYSIS, ROUTINE W REFLEX MICROSCOPIC   Collection Time    10/11/12  3:42 AM      Result Value Range   Color, Urine YELLOW  YELLOW   APPearance CLEAR  CLEAR   Specific Gravity, Urine 1.025  1.005 - 1.030   pH 6.5  5.0 - 8.0   Glucose, UA NEGATIVE  NEGATIVE mg/dL    Hgb urine dipstick TRACE (*) NEGATIVE   Bilirubin Urine SMALL (*) NEGATIVE   Ketones, ur 15 (*) NEGATIVE mg/dL   Protein, ur NEGATIVE  NEGATIVE mg/dL   Urobilinogen, UA 0.2  0.0 - 1.0 mg/dL   Nitrite NEGATIVE  NEGATIVE   Leukocytes, UA NEGATIVE  NEGATIVE  PREGNANCY, URINE   Collection Time    10/11/12  3:42 AM      Result Value Range   Preg Test, Ur NEGATIVE  NEGATIVE  URINE MICROSCOPIC-ADD ON   Collection Time    10/11/12  3:42 AM      Result Value Range   Squamous Epithelial / LPF MANY (*) RARE   WBC, UA 0-2  <3 WBC/hpf   RBC / HPF 0-2  <3 RBC/hpf   Bacteria, UA MANY (*) RARE  CBC WITH DIFFERENTIAL   Collection Time    10/11/12  4:11 AM      Result Value Range   WBC 8.1  4.0 - 10.5 K/uL   RBC 4.67  3.87 - 5.11 MIL/uL   Hemoglobin 13.6  12.0 - 15.0 g/dL   HCT 39.2  36.0 - 46.0 %   MCV 83.9  78.0 - 100.0 fL   MCH 29.1  26.0 - 34.0 pg   MCHC 34.7  30.0 - 36.0 g/dL   RDW 28.4  13.2 - 44.0 %   Platelets 299  150 - 400 K/uL   Neutrophils Relative % 82 (*) 43 - 77 %   Neutro Abs 6.7  1.7 - 7.7 K/uL   Lymphocytes Relative 12  12 - 46 %   Lymphs Abs 1.0  0.7 - 4.0 K/uL   Monocytes Relative 5  3 - 12 %   Monocytes Absolute 0.4  0.1 - 1.0 K/uL   Eosinophils Relative 0  0 - 5 %   Eosinophils Absolute 0.0  0.0 - 0.7 K/uL   Basophils Relative 0  0 - 1 %   Basophils Absolute 0.0  0.0 - 0.1 K/uL  BASIC METABOLIC PANEL   Collection Time    10/11/12  4:11 AM      Result Value Range   Sodium 141  135 - 145 mEq/L   Potassium 3.8  3.5 - 5.1 mEq/L   Chloride 107  96 - 112 mEq/L   CO2 24  19 - 32 mEq/L   Glucose, Bld 120 (*) 70 - 99 mg/dL   BUN 5 (*) 6 - 23 mg/dL   Creatinine, Ser 1.02  0.50 - 1.10 mg/dL   Calcium 9.1  8.4 - 72.5 mg/dL   GFR calc non Af Amer >90  >90 mL/min   GFR calc Af Amer >90  >90 mL/min  LIPASE, BLOOD   Collection Time    10/11/12  4:11 AM      Result Value Range   Lipase 26  11 - 59 U/L  Results for orders placed during the hospital encounter of  05/31/12 (from the past 8736 hour(s))  POCT HEMOGLOBIN-HEMACUE   Collection Time    05/31/12  8:30 AM      Result Value Range   Hemoglobin 12.1  12.0 - 15.0 g/dL  Family doctor draws these  Assessment Axis I bipolar 1 disorder, history of postpartum depression, history of ADHD Axis II deferred Axis III see medical history Axis IV moderate Axis V 60-65  Plan: I took her vitals.  I reviewed CC, tobacco/med/surg Hx, meds effects/ side effects, problem list, therapies and responses as well as current situation/symptoms discussed options.exa to 50 mg a day for a short bit. Increase Lamictal to 200 mg each bedtime, continue the Celexa 40 mg every morning, consolidated Adderall XR to 30 mg every morning and 10 mg of Adderall it after school continue Xanax 0.5 mg each bedtime as needed. Return to clinic in 2 months but call if her depression worsen sooner See orders and pt instructions for more details.  MEDICATIONS this encounter: Meds ordered this encounter  Medications  . citalopram (CELEXA) 40 MG tablet    Sig: Take 1 tablet (40 mg total) by mouth daily.    Dispense:  30 tablet    Refill:  2  . lamoTRIgine (LAMICTAL) 200 MG tablet    Sig: Take 1 tablet (200 mg total) by mouth daily.    Dispense:  30 tablet    Refill:  2  . amphetamine-dextroamphetamine (ADDERALL XR) 10 MG 24 hr capsule    Sig: Take 3 capsules (30 mg total) by mouth daily.    Dispense:  30 capsule    Refill:  0  . amphetamine-dextroamphetamine (ADDERALL) 10 MG tablet    Sig: Take  1 tablet (10 mg total) by mouth daily.    Dispense:  30 tablet    Refill:  0    Please dispense name brand Adderall per Medicaid;    Order Specific Question:  Supervising Provider    Answer:  Merlyn Albert [[id number]]  . amphetamine-dextroamphetamine (ADDERALL XR) 10 MG 24 hr capsule    Sig: Take 3 capsules (30 mg total) by mouth daily.    Dispense:  30 capsule    Refill:  0    Do not fill before 12/26/12  .  amphetamine-dextroamphetamine (ADDERALL) 10 MG tablet    Sig: Take 1 tablet (10 mg total) by mouth daily.    Dispense:  90 tablet    Refill:  0    Brand name medically necessary, do not fill before 12/26/12  . ALPRAZolam (XANAX) 0.5 MG tablet    Sig: Take 1 tablet (0.5 mg total) by mouth at bedtime as needed (anxiety).    Dispense:  30 tablet    Refill:  2    Medical Decision Making Problem Points:  Established problem, stable/improving (1), Review of last therapy session (1) and Review of psycho-social stressors (1) Data Points:  Review or order clinical lab tests (1) Review of medication regiment & side effects (2)  I certify that outpatient services furnished can reasonably be expected to improve the patient's condition.   Diannia Ruder, MD

## 2012-12-02 ENCOUNTER — Emergency Department (HOSPITAL_COMMUNITY)
Admission: EM | Admit: 2012-12-02 | Discharge: 2012-12-02 | Disposition: A | Discharge status: Home / Self Care | Payer: Medicaid Other | Attending: Emergency Medicine | Admitting: Emergency Medicine

## 2012-12-02 ENCOUNTER — Encounter (HOSPITAL_COMMUNITY): Payer: Self-pay | Admitting: Adult Health

## 2012-12-02 ENCOUNTER — Emergency Department (HOSPITAL_COMMUNITY): Payer: Medicaid Other

## 2012-12-02 DIAGNOSIS — Z8719 Personal history of other diseases of the digestive system: Secondary | ICD-10-CM | POA: Insufficient documentation

## 2012-12-02 DIAGNOSIS — F411 Generalized anxiety disorder: Secondary | ICD-10-CM | POA: Insufficient documentation

## 2012-12-02 DIAGNOSIS — IMO0002 Reserved for concepts with insufficient information to code with codable children: Secondary | ICD-10-CM | POA: Insufficient documentation

## 2012-12-02 DIAGNOSIS — G43909 Migraine, unspecified, not intractable, without status migrainosus: Secondary | ICD-10-CM | POA: Insufficient documentation

## 2012-12-02 DIAGNOSIS — I10 Essential (primary) hypertension: Secondary | ICD-10-CM | POA: Insufficient documentation

## 2012-12-02 DIAGNOSIS — J45909 Unspecified asthma, uncomplicated: Secondary | ICD-10-CM | POA: Insufficient documentation

## 2012-12-02 DIAGNOSIS — S93402A Sprain of unspecified ligament of left ankle, initial encounter: Secondary | ICD-10-CM

## 2012-12-02 DIAGNOSIS — F17200 Nicotine dependence, unspecified, uncomplicated: Secondary | ICD-10-CM | POA: Insufficient documentation

## 2012-12-02 DIAGNOSIS — F319 Bipolar disorder, unspecified: Secondary | ICD-10-CM | POA: Insufficient documentation

## 2012-12-02 DIAGNOSIS — F909 Attention-deficit hyperactivity disorder, unspecified type: Secondary | ICD-10-CM | POA: Insufficient documentation

## 2012-12-02 DIAGNOSIS — Z9889 Other specified postprocedural states: Secondary | ICD-10-CM | POA: Insufficient documentation

## 2012-12-02 DIAGNOSIS — S93409A Sprain of unspecified ligament of unspecified ankle, initial encounter: Secondary | ICD-10-CM | POA: Insufficient documentation

## 2012-12-02 DIAGNOSIS — Z23 Encounter for immunization: Secondary | ICD-10-CM | POA: Insufficient documentation

## 2012-12-02 DIAGNOSIS — Z79899 Other long term (current) drug therapy: Secondary | ICD-10-CM | POA: Insufficient documentation

## 2012-12-02 DIAGNOSIS — Y9301 Activity, walking, marching and hiking: Secondary | ICD-10-CM | POA: Insufficient documentation

## 2012-12-02 DIAGNOSIS — Z872 Personal history of diseases of the skin and subcutaneous tissue: Secondary | ICD-10-CM | POA: Insufficient documentation

## 2012-12-02 DIAGNOSIS — Y9289 Other specified places as the place of occurrence of the external cause: Secondary | ICD-10-CM | POA: Insufficient documentation

## 2012-12-02 DIAGNOSIS — W010XXA Fall on same level from slipping, tripping and stumbling without subsequent striking against object, initial encounter: Secondary | ICD-10-CM | POA: Insufficient documentation

## 2012-12-02 MED ORDER — TETANUS-DIPHTH-ACELL PERTUSSIS 5-2.5-18.5 LF-MCG/0.5 IM SUSP
0.5000 mL | Freq: Once | INTRAMUSCULAR | Status: AC
  Administered 2012-12-02: 0.5 mL via INTRAMUSCULAR
  Filled 2012-12-02: qty 0.5

## 2012-12-02 MED ORDER — NAPROXEN 500 MG PO TABS: 500.0000 mg | ORAL_TABLET | Freq: Two times a day (BID) | ORAL | Status: DC

## 2012-12-02 NOTE — ED Notes (Signed)
Presents with left ankle injury from tripping on rocks, left ankle scrapes and edema noted, no deformity, CMS intact.

## 2012-12-02 NOTE — ED Notes (Signed)
Patient transported to X-ray 

## 2012-12-02 NOTE — ED Provider Notes (Signed)
CSN: [id number]     Arrival date & time 12/02/12  1723 History  This chart was scribed for non-physician practitioner Lynelle Doctor working with Flint Melter, MD by Valera Castle, ED scribe. This patient was seen in room TR11C/TR11C and the patient's care was started at 6:20 PM.    Chief Complaint  Patient presents with  . Ankle Pain    The history is provided by the patient. No language interpreter was used.   HPI Comments: Kristin Stephens is a 27 y.o. female who presents to the Emergency Department complaining of constant, moderate left ankle pain, onset when she fell after a rock slipped while walking over rocks. She reports a sudden onset of swelling of her ankle. She also has minor abrasions to her left foot. She states she is able to ambulate, but with pain. She reports a h/o spraining her left ankle. She states she thinks she twisted it, but does not think that she broke it. She is unsure of when she last had her tetanus vaccination. She denies head injury, LOC, or any other symptoms after the fall.      Past Medical History  Diagnosis Date  . Esophageal reflux   . IBS (irritable bowel syndrome)   . Anxiety   . Depression   . Bipolar affective disorder   . TMJ syndrome   . Asthma     daily and prn inhalers  . Deviated nasal septum 05/2012  . Nasal turbinate hypertrophy 05/2012    bilateral  . Dental crowns present   . Eczema     right leg  . Migraine headache   . Hypertension   . ADHD (attention deficit hyperactivity disorder)    Past Surgical History  Procedure Laterality Date  . Asd and vsd repair  1989  . Coarctation of aorta repair  1989  . Cesarean section  04/12/2009  . Esophagogastroduodenoscopy  04/04/2008      Normal esophagus/Mild patchy erythema in the antrum/ Normal duodenal bulb, normal small bowel biopsy  . Flexible sigmoidoscopy  04/04/2008      Small internal hemorrhoids (poor bowel prep)  . Cardiac catheterization  06/18/2006  . Nasal  septoplasty w/ turbinoplasty Bilateral 05/31/2012    Procedure: NASAL SEPTOPLASTY WITH TURBINATE REDUCTION;  Surgeon: Darletta Moll, MD;  Location: [facility];  Service: ENT;  Laterality: Bilateral;   Family History  Problem Relation Age of Onset  . Hyperlipidemia Mother   . Hypertension Mother   . Depression Mother   . Physical abuse Mother   . Sexual abuse Mother     possibly  . Emphysema Paternal Grandmother   . Heart disease Paternal Grandmother   . Bipolar disorder Paternal Grandmother   . Dementia Paternal Grandmother   . Heart disease Maternal Grandmother   . Dementia Maternal Grandmother   . Stroke Maternal Grandfather   . Prostate cancer Maternal Grandfather   . Personality disorder Father   . Bipolar disorder Father   . Alcohol abuse Father   . ADD / ADHD Sister   . Anxiety disorder Brother   . ADD / ADHD Brother   . ADD / ADHD Brother   . Seizures Daughter   . Drug abuse Neg Hx   . OCD Neg Hx   . Paranoid behavior Neg Hx   . Schizophrenia Neg Hx    History  Substance Use Topics  . Smoking status: Light Tobacco Smoker -- 0.50 packs/day for 10 years    Types: Cigarettes  Last Attempt to Quit: 04/01/2011  . Smokeless tobacco: Former Neurosurgeon    Quit date: 05/01/2012     Comment: 3-6 cigs a day during for finals week as of 07/18/2012  . Alcohol Use: No     Comment: 1-2 x/month   OB History   Grav Para Term Preterm Abortions TAB SAB Ect Mult Living                 Review of Systems  Musculoskeletal: Positive for arthralgias (left ankle) and gait problem.  Neurological: Negative for syncope and headaches.  All other systems reviewed and are negative.    Allergies  Sulfonamide derivatives and Coconut flavor  Home Medications   Current Outpatient Rx  Name  Route  Sig  Dispense  Refill  . albuterol (PROVENTIL HFA;VENTOLIN HFA) 108 (90 BASE) MCG/ACT inhaler   Inhalation   Inhale 1-2 puffs into the lungs every 6 (six) hours as needed for  wheezing.   1 Inhaler   0   . ALPRAZolam (XANAX) 0.5 MG tablet   Oral   Take 1 tablet (0.5 mg total) by mouth at bedtime as needed (anxiety).   30 tablet   2   . amoxicillin-clavulanate (AUGMENTIN) 875-125 MG per tablet   Oral   Take 1 tablet by mouth 2 (two) times daily.   20 tablet   0   . BIOTIN PO   Oral   Take by mouth.         . clobetasol cream (TEMOVATE) 0.05 %   Topical   Apply topically 2 (two) times daily. Prn rash up to 2 weeks   30 g   0   . lamoTRIgine (LAMICTAL) 200 MG tablet   Oral   Take 1 tablet (200 mg total) by mouth daily.   30 tablet   2   . medroxyPROGESTERone (DEPO-PROVERA) 150 MG/ML injection   Intramuscular   Inject 150 mg into the muscle every 3 (three) months.          Triage Vitals: BP 126/74  Pulse 106  Temp(Src) 98.9 F (37.2 C) (Oral)  Resp 16  SpO2 98%  Physical Exam  Nursing note and vitals reviewed. Constitutional: She is oriented to person, place, and time. She appears well-developed and well-nourished. No distress.  HENT:  Head: Normocephalic and atraumatic.  Eyes: EOM are normal.  Neck: Neck supple. No tracheal deviation present.  Cardiovascular: Normal rate.   DP pulses intact in her left foot.   Pulmonary/Chest: Effort normal. No respiratory distress.  Musculoskeletal: Normal range of motion.  No tenderness to the left knee. Full ROM of the left knee. Medial and lateral malleolus tenderness. Full ROM of the left ankle. Pain with inversion and eversion of the ankle. Joint is stable.   Neurological: She is alert and oriented to person, place, and time.  Skin: Skin is warm and dry.  Superficial abrasion to the anterior of left ankle.   Psychiatric: She has a normal mood and affect. Her behavior is normal.    ED Course  Procedures (including critical care time)  DIAGNOSTIC STUDIES: Oxygen Saturation is 98% on room air, normal by my interpretation.    COORDINATION OF CARE: 6:26 PM-Pt advised of normal  radiology findings indicating no ankle fracture. Discussed treatment plan which includes updating her tetanus vaccination with pt at bedside and pt agreed to plan. Pt also advised of plan to receive a brace and to f/u if her pain worsens.  Medications  TDaP (BOOSTRIX) injection 0.5  mL (not administered)     Labs Review Labs Reviewed - No data to display Imaging Review Dg Ankle Complete Left  12/02/2012   *RADIOLOGY REPORT*  Clinical Data: Left ankle pain after fall.  LEFT ANKLE COMPLETE - 3+ VIEW  Comparison: Plain films 01/05/2012.  Findings: Imaged bones, joints and soft tissues appear normal.  IMPRESSION: Negative exam.   Original Report Authenticated By: Holley Dexter, M.D.    MDM   1. Ankle sprain, left, initial encounter    Patient with twisting injury of the left ankle. No knee pain or tenderness. X-ray of the ankle is normal. I suspect this is likely ankle sprain. ASO provided. She is ambulatory on the ankle. Will treated with anti-inflammatories, ice, elevation. Followup as needed. Do to an abrasion tetanus was updated.  Filed Vitals:   12/02/12 1742  BP: 126/74  Pulse: 106  Temp: 98.9 F (37.2 C)  TempSrc: Oral  Resp: 16  SpO2: 98%     I personally performed the services described in this documentation, which was scribed in my presence. The recorded information has been reviewed and is accurate.   Lottie Mussel, PA-C 12/02/12 1920

## 2012-12-03 NOTE — ED Provider Notes (Signed)
Medical screening examination/treatment/procedure(s) were performed by non-physician practitioner and as supervising physician I was immediately available for consultation/collaboration.  Kortney Potvin L Ziaire Bieser, MD 12/03/12 0043 

## 2012-12-08 ENCOUNTER — Telehealth (HOSPITAL_COMMUNITY): Payer: Self-pay | Admitting: Psychiatry

## 2012-12-08 NOTE — Telephone Encounter (Signed)
Quantity corrected to 90

## 2012-12-08 NOTE — Telephone Encounter (Signed)
Called pharmacy, they have no strengths of Adderall xr. Instructed pt to try another pharmacy

## 2012-12-09 ENCOUNTER — Ambulatory Visit (INDEPENDENT_AMBULATORY_CARE_PROVIDER_SITE_OTHER): Payer: 59 | Admitting: Psychiatry

## 2012-12-09 DIAGNOSIS — F319 Bipolar disorder, unspecified: Secondary | ICD-10-CM

## 2012-12-10 NOTE — Patient Instructions (Signed)
Discussed orally 

## 2012-12-10 NOTE — Progress Notes (Signed)
Patient:  Kristin Stephens   DOB: [birth year]  MR Number: [id number]  [facility]: [facility]:  [address] [place],  [place], [address]  Start: Thursday 12/09/2012 4:15  PM End: Thursday 12/09/2012 5:05  PM  Provider/Observer:     Florencia Reasons, MSW, LCSW   Chief Complaint:      Chief Complaint  Patient presents with  . Anxiety    Reason For Service:     The patient is a returning patient to this practice and reports increased symptoms of anxiety and depression. She has a history of bipolar disorder. Patient is seen today for follow up appointment.  Interventions Strategy:  Supportive therapy,cognitive behavioral therapy  Participation Level:   Active  Participation Quality:  Appropriate    Behavioral Observation:  Casual, Alert,  Talkative, tearful at times  Current Psychosocial Factors: Patient reports uncle's cancer has returned.  Content of Session:   Reviewing symptoms, processing feelings, reviewing coping and relaxation techniques  Current Status:   The patient reports stable mood but anxiety and sadness.  Patient Progress:    The patient reports has resumed school and is doing well this academic year. She is taking 4 classes and making progress in achieving balance regarding her responsibilities. She expresses relief her daughter now is taking seizure medication and is doing well. Patient expresses concern regarding her uncle as cancer has returned. She expresses sadness as well as anxiety. She is frustrated as she thinks uncle may not be totally honest about his condition and prognosis. Therapist  works with patient to process her feelings and to review relaxation and coping techniques.   Target Goals:   1. Increase understanding of mental disorder and improve self-care ; 1: 1 psychoeducation and cognitive behavior therapy, one time every 1-4 weeks     2. Improve communication skills (decrease yelling) and ability to identify and verbalize feelings; 1-1  psychotherapy ( supportive and cognitive therapy) one time every 1-4 weeks    3. Decrease anxiety and improve relaxation/coping techniques; 1-1 psychotherapy (supportive and cognitive behavioral therapy) one time every 1-4 weeks    4. Increase positive thought patterns and decreased negative thought patterns; 1:1 psychotherapy (supportive and cognitive therapy) one time every 1-4 weeks  Last Reviewed:   01/19/2012  Goals Addressed Today:    Goal 3  Impression/Diagnosis:   The patient has a long-standing history of symptoms of anxiety and depression along with mood swings beginning in early adolescence. Symptoms have worsened since the birth of her daughter in January 2011. Current symptoms now include anxiety, depressed mood, ruminating thoughts, irritability, mood swings, and crying spells. Diagnosis: bipolar 1 disorder  Diagnosis:  Axis I:  Bipolar disorder          Axis II: Deferred

## 2012-12-16 ENCOUNTER — Encounter: Payer: Self-pay | Admitting: Family Medicine

## 2012-12-16 ENCOUNTER — Ambulatory Visit (INDEPENDENT_AMBULATORY_CARE_PROVIDER_SITE_OTHER): Payer: Medicaid Other | Admitting: Family Medicine

## 2012-12-16 VITALS — BP 122/82 | Ht 65.0 in | Wt 207.2 lb

## 2012-12-16 DIAGNOSIS — M25512 Pain in left shoulder: Secondary | ICD-10-CM

## 2012-12-16 DIAGNOSIS — M25519 Pain in unspecified shoulder: Secondary | ICD-10-CM

## 2012-12-16 MED ORDER — DICLOFENAC SODIUM 75 MG PO TBEC: 75.0000 mg | DELAYED_RELEASE_TABLET | Freq: Two times a day (BID) | ORAL | Status: DC

## 2012-12-16 NOTE — Progress Notes (Signed)
  Subjective:    Patient ID: Kristin Stephens, female    DOB: 1986-01-31, 27 y.o.   MRN: [id number]  Shoulder Pain  The pain is present in the left shoulder and left arm. This is a chronic problem. The current episode started more than 1 year ago. The problem occurs constantly. The problem has been unchanged. The quality of the pain is described as aching. The pain is at a severity of 7/10. The pain is moderate. Associated symptoms include numbness and tingling. The symptoms are aggravated by activity. She has tried rest for the symptoms. The treatment provided mild relief.    Years worth of pain.  Hx of physical therapy, has had cortisone rxs and shots in the past.  Has seen dr Ave Filter at [facility] and others.  More and more painful,  Doesn't want shots for ever, takes occas otcs. Recalls no remote injury.   Review of Systems  Neurological: Positive for tingling and numbness.   [phone]    Objective:   Physical Exam Alert no acute distress. Lungs clear. Heart regular in rhythm. Shoulder significant pain with rotation. Hands sensation grossly intact slight diminishment in home are region.       Assessment & Plan:  Impression chronic shoulder pain with high frustration on part of patient. She wants to see a different specialist. On her neuropathy like symptoms for just one week's duration with no pain or weakness. Plan trial Voltaren or so consult would give the ulnar neuropathy a bit of time before initiating for workup. WSL

## 2012-12-27 ENCOUNTER — Ambulatory Visit (INDEPENDENT_AMBULATORY_CARE_PROVIDER_SITE_OTHER): Payer: Medicaid Other | Admitting: Nurse Practitioner

## 2012-12-27 ENCOUNTER — Encounter: Payer: Self-pay | Admitting: Nurse Practitioner

## 2012-12-27 VITALS — BP 122/88 | Temp 98.2°F | Ht 65.0 in | Wt 207.0 lb

## 2012-12-27 DIAGNOSIS — J069 Acute upper respiratory infection, unspecified: Secondary | ICD-10-CM

## 2012-12-27 MED ORDER — FLUTICASONE PROPIONATE 50 MCG/ACT NA SUSP: 2.0000 | Freq: Every day | NASAL | Status: DC

## 2012-12-27 MED ORDER — AMOXICILLIN-POT CLAVULANATE 875-125 MG PO TABS: 1.0000 | ORAL_TABLET | Freq: Two times a day (BID) | ORAL | Status: DC

## 2012-12-29 ENCOUNTER — Telehealth: Payer: Self-pay | Admitting: Internal Medicine

## 2012-12-29 NOTE — Telephone Encounter (Signed)
New Problem  Pt request clearance for anaesthetic usage for 10/13 appointment w/ oral surgeon, Pt believes they they will use statinal and valium. Please call back advise.

## 2012-12-29 NOTE — Telephone Encounter (Signed)
Spoke with pt, she is having wisdom teeth removed. Will forward for dr Tenny Craw review

## 2012-12-29 NOTE — Telephone Encounter (Signed)
Yes, OK to proceed with anethesia for dental work.

## 2012-12-30 ENCOUNTER — Telehealth (HOSPITAL_COMMUNITY): Payer: Self-pay | Admitting: Psychiatry

## 2012-12-30 ENCOUNTER — Encounter: Payer: Self-pay | Admitting: Nurse Practitioner

## 2012-12-30 NOTE — Telephone Encounter (Signed)
Left message for pt of dr Tenny Craw recommendations

## 2012-12-30 NOTE — Progress Notes (Signed)
Subjective:  Presents complaints of head congestion that began 2 days ago. No fever. Mild headaches. Mild body aches which have improved. Head congestion. Mild sore throat. Ear pressure. Postnasal drainage. No vomiting diarrhea or abdominal pain. Smoker.  Objective:   BP 122/88  Temp(Src) 98.2 F (36.8 C)  Ht 5\' 5"  (1.651 m)  Wt 207 lb (93.895 kg)  BMI 34.45 kg/m2 NAD. Alert, oriented. TMs significant clear effusion, no erythema. Pharynx mildly injected with green PND noted. Neck supple with mild soft nontender adenopathy. Lungs clear. Heart regular rate rhythm.  Assessment:Acute upper respiratory infections of unspecified site  Plan: Meds ordered this encounter  Medications  . citalopram (CELEXA) 40 MG tablet    Sig: Take 40 mg by mouth daily.  Marland Kitchen amoxicillin-clavulanate (AUGMENTIN) 875-125 MG per tablet    Sig: Take 1 tablet by mouth 2 (two) times daily.    Dispense:  20 tablet    Refill:  0    Order Specific Question:  Supervising Provider    Answer:  Merlyn Albert [[id number]]  . fluticasone (FLONASE) 50 MCG/ACT nasal spray    Sig: Place 2 sprays into the nose daily.    Dispense:  16 g    Refill:  11    Order Specific Question:  Supervising Provider    Answer:  Merlyn Albert [[id number]]   OTC antihistamines as directed. Call back by the end of the week if no improvement, sooner if worse.

## 2012-12-31 NOTE — Telephone Encounter (Signed)
Told meds from dentist will be ok with antidepressants and lamictal

## 2013-01-13 DIAGNOSIS — S4380XA Sprain of other specified parts of unspecified shoulder girdle, initial encounter: Secondary | ICD-10-CM | POA: Insufficient documentation

## 2013-01-20 ENCOUNTER — Ambulatory Visit (INDEPENDENT_AMBULATORY_CARE_PROVIDER_SITE_OTHER): Payer: 59 | Admitting: Psychiatry

## 2013-01-20 ENCOUNTER — Encounter (HOSPITAL_COMMUNITY): Payer: Self-pay | Admitting: Psychiatry

## 2013-01-20 VITALS — BP 140/90 | Ht 67.0 in | Wt 210.0 lb

## 2013-01-20 DIAGNOSIS — F319 Bipolar disorder, unspecified: Secondary | ICD-10-CM

## 2013-01-20 MED ORDER — AMPHETAMINE-DEXTROAMPHET ER 30 MG PO CP24: 30.0000 mg | ORAL_CAPSULE | Freq: Every day | ORAL | Status: DC

## 2013-01-20 MED ORDER — AMPHETAMINE-DEXTROAMPHETAMINE 10 MG PO TABS: 10.0000 mg | ORAL_TABLET | Freq: Three times a day (TID) | ORAL | Status: DC

## 2013-01-20 MED ORDER — CITALOPRAM HYDROBROMIDE 40 MG PO TABS: 40.0000 mg | ORAL_TABLET | Freq: Every day | ORAL | Status: DC

## 2013-01-20 MED ORDER — LAMOTRIGINE 200 MG PO TABS: 200.0000 mg | ORAL_TABLET | Freq: Every day | ORAL | Status: DC

## 2013-01-20 MED ORDER — AMPHETAMINE-DEXTROAMPHETAMINE 10 MG PO TABS: ORAL_TABLET | ORAL | Status: DC

## 2013-01-20 MED ORDER — ALPRAZOLAM 0.5 MG PO TABS: 0.5000 mg | ORAL_TABLET | Freq: Every evening | ORAL | Status: DC | PRN

## 2013-01-20 MED ORDER — AMPHETAMINE-DEXTROAMPHETAMINE 10 MG PO TABS: 10.0000 mg | ORAL_TABLET | Freq: Every day | ORAL | Status: DC | PRN

## 2013-01-20 NOTE — Progress Notes (Signed)
Patient ID: Kristin Stephens, female   DOB: November 10, 1985, 27 y.o.   MRN: [id number] Patient ID: Kristin Stephens, female   DOB: [birth year], 40 y.o.   MRN: [id number] [facility] [id number] Progress Note Kristin Stephens MRN: [id number] DOB: 03-Sep-1985 Age: 27 y.o.  Date: 01/20/2013 Start Time: 9:02 AM End Time: 9:22 AM  Chief Complaint: Chief Complaint  Patient presents with  . ADHD  . Depression  . Follow-up   Subjective: "I'm doing better."  This patient is a 27 year old single white female who lives with her mother and her 75 and a half-year-old daughter. She still dating the father of her baby. She is a Physicist, medical at [employer].  The patient states that she initially got depressed around age 27. She saw Peggy for counseling for a long time. Her teen years she double her depression by using alcohol marijuana and prescription pills she was also getting into self-mutilation. She was never psychiatrically hospitalized  The patient did well while she was pregnant with her daughter at age 27. However shortly after giving birth she got extremely depressed, she was unable to care for herself or the baby and had absolutely no interest in anything. She also began developing manic symptoms at times when she was very hyperactive "high", and didn't sleep or eat and was hyperalert. The physician had diagnosed her as being bipolar and she was placed on Lamictal.  The patient also has a history of ADHD that has been present since childhood. Adderall has been effective for this. The patient returns after 2 months. I increased her Lamictal to 200 mg and her Adderall XR to 30 mg. She's doing very well. Her mood is good. She's doing well her studies and managing to take care of her daughter who has a seizure disorder. So far she's not fallen and any sort of depressive episode. She's going to keep a close eye on it as a winter approaches. Current  psychiatric medication Lamictal 200 mg daily Celexa 40 mg daily Adderall xr 30 mg every morning and Adderall 10 mg after school Xanax  0.5 mg each bedtime  Past psychiatric history Patient has been seeing in this office since April 2012 by Dr Lolly Mustache however she has been seeing therapist in this office for more than 10 years.  Patient endorse history of mood swing, depression, anger and poor attention and concentration.  Patient also endorse history of self abusive behavior and endorse cutting herself with a razor blade when her grandmother was dying.  In the past she was prescribed Wellbutrin but she was admitted on medical for the she never took the Wellbutrin.  Patient denies any history of suicidal attempt or any inpatient psychiatric treatment.  She had history of burst of energy with excessive shopping, cleaning, road rage and impulse eating.   Psychosocial history Patient was raised by her mother and grandmother when her father left when she was only 88 years old.  Patient told her father was never in her life and she was very close to her grandmother.  Patient has a lot of resentment and anger towards her father.  Patient grandmother died when she was only 42 years old.  Patient has one daughter.  Patient has a very good relationship with her boyfriend whose been very supportive.  Family history family history includes ADD / ADHD in her brother, brother, and sister; Alcohol abuse in her father; Anxiety disorder in her brother; Bipolar disorder in her father  and paternal grandmother; Dementia in her maternal grandmother and paternal grandmother; Depression in her mother; Emphysema in her paternal grandmother; Heart disease in her maternal grandmother and paternal grandmother; Hyperlipidemia in her mother; Hypertension in her mother; Personality disorder in her father; Physical abuse in her mother; Prostate cancer in her maternal grandfather; Seizures in her daughter; Sexual abuse in her mother;  Stroke in her maternal grandfather. There is no history of Drug abuse, OCD, Paranoid behavior, or Schizophrenia.  Alcohol and substance use history Patient endorse history of using marijuana and drugs and alcohol in her teens when she was self-medicating for her depression and anxiety.  However she began drinking heavily again in the last several months, particularly on weekends. She quit 90 days ago and is attending AA.  Patient denies any history of DWI, tremors or blackout.  Education and work history Patient has GED and currently she is enrolled in [employer]  She is hoping to finish her schooling in 2015.  Patient has a history of attention and concentration problem which was formally tested by psychologist in this office.    Medical history Patient has history of headache, GERD, allergic rhinitis, hypertension, irritable bowel syndrome and aortic Valve repaired.    Mental status examination Patient is well groomed and casually dressed. She is calm and cooperative.  She described her mood is better and her affect is improved from the past    She denies any active or passive suicidal thoughts or homicidal thoughts.  Her speech is fast but clear and coherent.  Her thought process is logical linear and goal-directed.  There no psychotic symptoms present at this time.  Her attention and concentration this could.  She's oriented x3.  Her insight judgment and impulse control is okay.  Lab Results:  Results for orders placed during the hospital encounter of 10/11/12 (from the past 8736 hour(s))  URINALYSIS, ROUTINE W REFLEX MICROSCOPIC   Collection Time    10/11/12  3:42 AM      Result Value Range   Color, Urine YELLOW  YELLOW   APPearance CLEAR  CLEAR   Specific Gravity, Urine 1.025  1.005 - 1.030   pH 6.5  5.0 - 8.0   Glucose, UA NEGATIVE  NEGATIVE mg/dL   Hgb urine dipstick TRACE (*) NEGATIVE   Bilirubin Urine SMALL (*) NEGATIVE   Ketones, ur 15 (*) NEGATIVE mg/dL   Protein, ur NEGATIVE   NEGATIVE mg/dL   Urobilinogen, UA 0.2  0.0 - 1.0 mg/dL   Nitrite NEGATIVE  NEGATIVE   Leukocytes, UA NEGATIVE  NEGATIVE  PREGNANCY, URINE   Collection Time    10/11/12  3:42 AM      Result Value Range   Preg Test, Ur NEGATIVE  NEGATIVE  URINE MICROSCOPIC-ADD ON   Collection Time    10/11/12  3:42 AM      Result Value Range   Squamous Epithelial / LPF MANY (*) RARE   WBC, UA 0-2  <3 WBC/hpf   RBC / HPF 0-2  <3 RBC/hpf   Bacteria, UA MANY (*) RARE  CBC WITH DIFFERENTIAL   Collection Time    10/11/12  4:11 AM      Result Value Range   WBC 8.1  4.0 - 10.5 K/uL   RBC 4.67  3.87 - 5.11 MIL/uL   Hemoglobin 13.6  12.0 - 15.0 g/dL   HCT 16.1  09.6 - 04.5 %   MCV 83.9  78.0 - 100.0 fL   MCH 29.1  26.0 -  34.0 pg   MCHC 34.7  30.0 - 36.0 g/dL   RDW 16.1  09.6 - 04.5 %   Platelets 299  150 - 400 K/uL   Neutrophils Relative % 82 (*) 43 - 77 %   Neutro Abs 6.7  1.7 - 7.7 K/uL   Lymphocytes Relative 12  12 - 46 %   Lymphs Abs 1.0  0.7 - 4.0 K/uL   Monocytes Relative 5  3 - 12 %   Monocytes Absolute 0.4  0.1 - 1.0 K/uL   Eosinophils Relative 0  0 - 5 %   Eosinophils Absolute 0.0  0.0 - 0.7 K/uL   Basophils Relative 0  0 - 1 %   Basophils Absolute 0.0  0.0 - 0.1 K/uL  BASIC METABOLIC PANEL   Collection Time    10/11/12  4:11 AM      Result Value Range   Sodium 141  135 - 145 mEq/L   Potassium 3.8  3.5 - 5.1 mEq/L   Chloride 107  96 - 112 mEq/L   CO2 24  19 - 32 mEq/L   Glucose, Bld 120 (*) 70 - 99 mg/dL   BUN 5 (*) 6 - 23 mg/dL   Creatinine, Ser 4.09  0.50 - 1.10 mg/dL   Calcium 9.1  8.4 - 81.1 mg/dL   GFR calc non Af Amer >90  >90 mL/min   GFR calc Af Amer >90  >90 mL/min  LIPASE, BLOOD   Collection Time    10/11/12  4:11 AM      Result Value Range   Lipase 26  11 - 59 U/L  Results for orders placed during the hospital encounter of 05/31/12 (from the past 8736 hour(s))  POCT HEMOGLOBIN-HEMACUE   Collection Time    05/31/12  8:30 AM      Result Value Range   Hemoglobin  12.1  12.0 - 15.0 g/dL  Family doctor draws these  Assessment Axis I bipolar 1 disorder, history of postpartum depression, history of ADHD Axis II deferred Axis III see medical history Axis IV moderate Axis V 60-65  Plan: I took her vitals.  I reviewed CC, tobacco/med/surg Hx, meds effects/ side effects, problem list, therapies and responses as well as current situation/symptoms discussed options. She will continue her current effective medications. Return to clinic in 3 months but call if her depression worsen sooner See orders and pt instructions for more details.  MEDICATIONS this encounter: Meds ordered this encounter  Medications  . amphetamine-dextroamphetamine (ADDERALL) 10 MG tablet    Sig: Take 1 tablet (10 mg total) by mouth daily as needed (after 3pm for alertness).    Dispense:  30 tablet    Refill:  0    Do not fill before 02/20/13  . ALPRAZolam (XANAX) 0.5 MG tablet    Sig: Take 1 tablet (0.5 mg total) by mouth at bedtime as needed for sleep.    Dispense:  30 tablet    Refill:  2  . citalopram (CELEXA) 40 MG tablet    Sig: Take 1 tablet (40 mg total) by mouth daily.    Dispense:  30 tablet    Refill:  2  . lamoTRIgine (LAMICTAL) 200 MG tablet    Sig: Take 1 tablet (200 mg total) by mouth at bedtime.    Dispense:  30 tablet    Refill:  2  . DISCONTD: amphetamine-dextroamphetamine (ADDERALL) 10 MG tablet    Sig: Take 1 tablet (10 mg total) by mouth every 8 (  eight) hours.    Dispense:  30 tablet    Refill:  0    Do not fill before 02/20/13  . amphetamine-dextroamphetamine (ADDERALL) 10 MG tablet    Sig: Take one at 3 pm    Dispense:  30 tablet    Refill:  0    Do not fill before 03/22/13  . amphetamine-dextroamphetamine (ADDERALL XR) 30 MG 24 hr capsule    Sig: Take 1 capsule (30 mg total) by mouth daily.    Dispense:  30 capsule    Refill:  0  . amphetamine-dextroamphetamine (ADDERALL XR) 30 MG 24 hr capsule    Sig: Take 1 capsule (30 mg total) by mouth  daily.    Dispense:  30 capsule    Refill:  0    Do not fill before 02/20/13  . amphetamine-dextroamphetamine (ADDERALL XR) 30 MG 24 hr capsule    Sig: Take 1 capsule (30 mg total) by mouth daily.    Dispense:  30 capsule    Refill:  0    Do not fill before 03/22/13    Medical Decision Making Problem Points:  Established problem, stable/improving (1), Review of last therapy session (1) and Review of psycho-social stressors (1) Data Points:  Review or order clinical lab tests (1) Review of medication regiment & side effects (2)  I certify that outpatient services furnished can reasonably be expected to improve the patient's condition.   Diannia Ruder, MD

## 2013-01-21 ENCOUNTER — Encounter: Payer: Self-pay | Admitting: Family Medicine

## 2013-01-21 ENCOUNTER — Encounter: Payer: Self-pay | Admitting: Nurse Practitioner

## 2013-01-21 ENCOUNTER — Ambulatory Visit (INDEPENDENT_AMBULATORY_CARE_PROVIDER_SITE_OTHER): Payer: Medicaid Other | Admitting: Nurse Practitioner

## 2013-01-21 VITALS — BP 134/76 | Temp 98.1°F | Ht 65.0 in | Wt 214.0 lb

## 2013-01-21 DIAGNOSIS — J069 Acute upper respiratory infection, unspecified: Secondary | ICD-10-CM

## 2013-01-21 MED ORDER — CEFUROXIME AXETIL 500 MG PO TABS: 500.0000 mg | ORAL_TABLET | Freq: Two times a day (BID) | ORAL | Status: DC

## 2013-01-21 NOTE — Patient Instructions (Signed)

## 2013-01-23 ENCOUNTER — Encounter: Payer: Self-pay | Admitting: Nurse Practitioner

## 2013-01-23 NOTE — Progress Notes (Signed)
Subjective:  Presents complaints of cough and congestion for the past week. Last seen on 9/29 for URI which was resolved. No fever. Facial area headache. Cough especially at night. This a.m. produce green mucus. No wheezing. Some ear pain yesterday. Sore throat. Taking fluids well.  Objective:   BP 134/76  Temp(Src) 98.1 F (36.7 C) (Oral)  Ht 5\' 5"  (1.651 m)  Wt 214 lb (97.07 kg)  BMI 35.61 kg/m2 NAD. Alert, oriented. TMs significant clear effusion, no erythema. Pharynx erythematous with green PND noted. Neck supple with mild soft nontender adenopathy. Lungs clear. Heart regular rate rhythm.  Assessment:Acute upper respiratory infections of unspecified site  Plan: Meds ordered this encounter  Medications  . cefUROXime (CEFTIN) 500 MG tablet    Sig: Take 1 tablet (500 mg total) by mouth 2 (two) times daily.    Dispense:  20 tablet    Refill:  0    Order Specific Question:  Supervising Provider    Answer:  Merlyn Albert [[id number]]   OTC meds as directed for congestion and cough. Callback in 7-10 days if no improvement, sooner if worse.Marland Kitchen

## 2013-01-26 ENCOUNTER — Other Ambulatory Visit: Payer: Self-pay | Admitting: Gastroenterology

## 2013-01-27 ENCOUNTER — Ambulatory Visit (HOSPITAL_COMMUNITY): Payer: Self-pay | Admitting: Psychiatry

## 2013-01-27 ENCOUNTER — Encounter (HOSPITAL_COMMUNITY): Payer: Self-pay | Admitting: Psychiatry

## 2013-01-28 ENCOUNTER — Ambulatory Visit: Payer: Medicaid Other

## 2013-01-28 ENCOUNTER — Ambulatory Visit (INDEPENDENT_AMBULATORY_CARE_PROVIDER_SITE_OTHER): Payer: Medicaid Other | Admitting: *Deleted

## 2013-01-28 DIAGNOSIS — Z23 Encounter for immunization: Secondary | ICD-10-CM

## 2013-02-03 ENCOUNTER — Ambulatory Visit (INDEPENDENT_AMBULATORY_CARE_PROVIDER_SITE_OTHER): Payer: Medicaid Other | Admitting: Nurse Practitioner

## 2013-02-03 ENCOUNTER — Encounter: Payer: Self-pay | Admitting: Nurse Practitioner

## 2013-02-03 ENCOUNTER — Other Ambulatory Visit: Payer: Self-pay | Admitting: Nurse Practitioner

## 2013-02-03 VITALS — BP 122/74 | Temp 98.3°F | Ht 65.0 in | Wt 210.2 lb

## 2013-02-03 DIAGNOSIS — IMO0001 Reserved for inherently not codable concepts without codable children: Secondary | ICD-10-CM

## 2013-02-03 DIAGNOSIS — R5383 Other fatigue: Secondary | ICD-10-CM

## 2013-02-03 DIAGNOSIS — R5381 Other malaise: Secondary | ICD-10-CM

## 2013-02-03 DIAGNOSIS — M2550 Pain in unspecified joint: Secondary | ICD-10-CM

## 2013-02-03 DIAGNOSIS — M791 Myalgia, unspecified site: Secondary | ICD-10-CM

## 2013-02-03 MED ORDER — GABAPENTIN 300 MG PO CAPS: 300.0000 mg | ORAL_CAPSULE | Freq: Two times a day (BID) | ORAL | Status: DC

## 2013-02-04 LAB — SEDIMENTATION RATE: Sed Rate: 22 mm/hr (ref 0–22)

## 2013-02-04 LAB — ANA: Anti Nuclear Antibody(ANA): NEGATIVE

## 2013-02-04 LAB — B. BURGDORFI ANTIBODIES: B burgdorferi Ab IgG+IgM: 0.11 {ISR}

## 2013-02-04 LAB — RHEUMATOID FACTOR: Rhuematoid fact SerPl-aCnc: <10 IU/mL (ref <=14)

## 2013-02-06 ENCOUNTER — Encounter: Payer: Self-pay | Admitting: Nurse Practitioner

## 2013-02-06 DIAGNOSIS — M797 Fibromyalgia: Secondary | ICD-10-CM | POA: Insufficient documentation

## 2013-02-06 NOTE — Assessment & Plan Note (Signed)
.   gabapentin (NEURONTIN) 300 MG capsule    Sig: Take 1 capsule (300 mg total) by mouth 2 (two) times daily.    Dispense:  60 capsule    Refill:  0    Order Specific Question:  Supervising Provider    Answer:  Merlyn Albert [[id number]]   Discussed importance of stress reduction, exercise and healthy lifestyle. Recheck in 3 months, call back sooner if any problems.

## 2013-02-06 NOTE — Progress Notes (Signed)
Subjective:  Presents for c/o fibromyalgia symptoms. Persistent fatigue, worse with stress and cold weather. Generalized muscle pain at times. Difficulty picking up her child due to tenderness. No fevers. No history of tick bite.  Objective:   BP 122/74  Temp(Src) 98.3 F (36.8 C) (Oral)  Ht 5\' 5"  (1.651 m)  Wt 210 lb 4 oz (95.369 kg)  BMI 34.99 kg/m2 NAD. Alert, oriented. Lungs clear. Heart RRR. Positive pressure points upper and lower body bilat.  Assessment: Myalgia - Plan: Sed Rate (ESR), Antinuclear Antib (ANA), Rheumatoid factor  Joint pain - Plan: Sed Rate (ESR), Antinuclear Antib (ANA), Rheumatoid factor  Fatigue  Plan:  Meds ordered this encounter  Medications  . gabapentin (NEURONTIN) 300 MG capsule    Sig: Take 1 capsule (300 mg total) by mouth 2 (two) times daily.    Dispense:  60 capsule    Refill:  0    Order Specific Question:  Supervising Provider    Answer:  Merlyn Albert [[id number]]   Discussed importance of stress reduction, exercise and healthy lifestyle. Recheck in 3 months, call back sooner if any problems.

## 2013-02-08 ENCOUNTER — Other Ambulatory Visit (HOSPITAL_COMMUNITY): Payer: Self-pay | Admitting: Psychiatry

## 2013-02-08 MED ORDER — AMPHETAMINE-DEXTROAMPHETAMINE 10 MG PO TABS: 10.0000 mg | ORAL_TABLET | Freq: Every day | ORAL | Status: DC | PRN

## 2013-02-09 ENCOUNTER — Ambulatory Visit (INDEPENDENT_AMBULATORY_CARE_PROVIDER_SITE_OTHER): Payer: 59 | Admitting: Psychiatry

## 2013-02-09 DIAGNOSIS — F319 Bipolar disorder, unspecified: Secondary | ICD-10-CM

## 2013-02-09 NOTE — Patient Instructions (Signed)
Discussed orally 

## 2013-02-09 NOTE — Progress Notes (Signed)
Patient:  Kristin Stephens   DOB: 03-11-1986  MR Number: [id number]  Location: [facility]:  [address] [place] [place],  [place], [address]  Start: Wednesday 02/09/2013 2:00 PM End: Wednesday 02/09/2013 2:55 PM  Provider/Observer:     Florencia Reasons, MSW, LCSW   Chief Complaint:      Chief Complaint  Patient presents with  . Anxiety  . Depression    Reason For Service:     The patient is a returning patient to this practice and reports increased symptoms of anxiety and depression. She has a history of bipolar disorder. Patient is seen today for follow up appointment.  Interventions Strategy:  Supportive therapy,cognitive behavioral therapy  Participation Level:   Active  Participation Quality:  Appropriate    Behavioral Observation:  Casual, Alert,  Talkative, tearful at times  Current Psychosocial Factors: Patient's daughter continues to have health issues. Patient recently was diagnosed with fibromyalgia. 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 Patient's uncle has a terminal illness.  Content of Session:   Reviewing symptoms, processing feelings, reviewing coping and relaxation techniques  Current Status:   The patient reports increased sadness, anxiety, and excessive worry.  Patient Progress:    The patient reports increased stress as she was diagnosed with fibromyalgia last week and fears this will affect her ability  to be a good mother to her child. Patient continues to express guilt about her emotional state and decreased involvement with  her daughter for 6 months after her birth due to suffering post partum depression. She also is experiencing anger as she has 3 major conditions including heart issues, bipolar disorder, and fibromyalgia. She states being angry with God. Therapist works with patient to process her feelings and to identify and challenge cognitive distortions. Therapist also works with patient to identify  ways to improve self-care, schedule time for self, and reviewi coping and relaxation techniques.  Target Goals:   1. Increase understanding of mental disorder and improve self-care ; 1: 1 psychoeducation and cognitive behavior therapy, one time every 1-4 weeks     2. Improve communication skills (decrease yelling) and ability to identify and verbalize feelings; 1-1 psychotherapy ( supportive and cognitive therapy) one time every 1-4 weeks    3. Decrease anxiety and improve relaxation/coping techniques; 1-1 psychotherapy (supportive and cognitive behavioral therapy) one time every 1-4 weeks    4. Increase positive thought patterns and decreased negative thought patterns; 1:1 psychotherapy (supportive and cognitive therapy) one time every 1-4 weeks  Last Reviewed:   01/19/2012  Goals Addressed Today:    3,4  Impression/Diagnosis:   The patient has a long-standing history of symptoms of anxiety and depression along with mood swings beginning in early adolescence. Symptoms have worsened since the birth of her daughter in January 2011. Current symptoms now include anxiety, depressed mood, ruminating thoughts, irritability, mood swings, and crying spells. Diagnosis: bipolar 1 disorder  Diagnosis:  Axis I:  Bipolar disorder          Axis II: Deferred

## 2013-02-10 ENCOUNTER — Ambulatory Visit (INDEPENDENT_AMBULATORY_CARE_PROVIDER_SITE_OTHER): Payer: Medicaid Other | Admitting: *Deleted

## 2013-02-10 DIAGNOSIS — Z3049 Encounter for surveillance of other contraceptives: Secondary | ICD-10-CM

## 2013-02-10 DIAGNOSIS — IMO0001 Reserved for inherently not codable concepts without codable children: Secondary | ICD-10-CM

## 2013-02-10 MED ORDER — MEDROXYPROGESTERONE ACETATE 150 MG/ML IM SUSP
150.0000 mg | Freq: Once | INTRAMUSCULAR | Status: AC
  Administered 2013-02-10: 150 mg via INTRAMUSCULAR

## 2013-03-02 ENCOUNTER — Ambulatory Visit (INDEPENDENT_AMBULATORY_CARE_PROVIDER_SITE_OTHER): Payer: Medicaid Other | Admitting: Nurse Practitioner

## 2013-03-02 ENCOUNTER — Encounter: Payer: Self-pay | Admitting: Nurse Practitioner

## 2013-03-02 ENCOUNTER — Telehealth: Payer: Self-pay | Admitting: Internal Medicine

## 2013-03-02 VITALS — BP 126/68 | Ht 65.0 in | Wt 210.0 lb

## 2013-03-02 DIAGNOSIS — Z79899 Other long term (current) drug therapy: Secondary | ICD-10-CM

## 2013-03-02 DIAGNOSIS — F429 Obsessive-compulsive disorder, unspecified: Secondary | ICD-10-CM

## 2013-03-02 DIAGNOSIS — IMO0001 Reserved for inherently not codable concepts without codable children: Secondary | ICD-10-CM

## 2013-03-02 DIAGNOSIS — M797 Fibromyalgia: Secondary | ICD-10-CM

## 2013-03-02 DIAGNOSIS — Q251 Coarctation of aorta: Secondary | ICD-10-CM

## 2013-03-02 DIAGNOSIS — Z8774 Personal history of (corrected) congenital malformations of heart and circulatory system: Secondary | ICD-10-CM

## 2013-03-02 MED ORDER — GABAPENTIN 300 MG PO CAPS: ORAL_CAPSULE | ORAL | Status: DC

## 2013-03-02 NOTE — Telephone Encounter (Signed)
New Message  Pt requests a call back to discuss if an ECHO is need for her 12/4 appointment with Dr. Tenny Craw // Please call back to discuss

## 2013-03-02 NOTE — Telephone Encounter (Signed)
I would recomm an echo before I see her   I would resched appt until after echo done. Hx coarctation of aorta, ASD and VSD  S/p Repairs

## 2013-03-02 NOTE — Telephone Encounter (Signed)
Left message for pt, according to her chart her last echo was 2011. Will find out from dr Tenny Craw if she would like an echo prior to follow up appt.

## 2013-03-02 NOTE — Telephone Encounter (Signed)
Left message for pt to call to schedule

## 2013-03-03 ENCOUNTER — Ambulatory Visit: Payer: Self-pay | Admitting: Internal Medicine

## 2013-03-03 LAB — HEPATIC FUNCTION PANEL
AST: 16 U/L (ref 0–37)
Alkaline Phosphatase: 85 U/L (ref 39–117)
Bilirubin, Direct: 0.1 mg/dL (ref 0.0–0.3)
Total Bilirubin: 0.5 mg/dL (ref 0.3–1.2)

## 2013-03-03 NOTE — Telephone Encounter (Signed)
Echo and office visit after echo scheduled.

## 2013-03-07 ENCOUNTER — Encounter: Payer: Self-pay | Admitting: Nurse Practitioner

## 2013-03-07 DIAGNOSIS — F429 Obsessive-compulsive disorder, unspecified: Secondary | ICD-10-CM | POA: Insufficient documentation

## 2013-03-07 NOTE — Assessment & Plan Note (Signed)
Slowly titrate Neurontin dose as directed. Stop at dose if any adverse affects. Liver profile pending due to excessive Tylenol use. Strongly recommend the patient cutback use to no more than 3200 mg per day. Patient is currently carrying around a very large bottle of acetaminophen. Patient is to give this to her mother so dosing can be control. Also strongly recommend that she discuss her OCD issues with her counselor at next visit. Recheck in 3 months, call back sooner if any problems.

## 2013-03-07 NOTE — Assessment & Plan Note (Signed)
Slowly titrate Neurontin dose as directed. Stop at dose if any adverse affects. Liver profile pending due to excessive Tylenol use. Strongly recommend the patient cutback use to no more than 3200 mg per day. Patient is currently carrying around a very large bottle of acetaminophen. Patient is to give this to her mother so dosing can be control. Also strongly recommend that she discuss her OCD issues with her counselor at next visit. Recheck in 3 months, call back sooner if any problems. 

## 2013-03-07 NOTE — Progress Notes (Signed)
Subjective:  Presents for recheck of her fibromyalgia. Has seen minimal improvement on Neurontin but denies any side effects. Has a regular yearly appointment with her cardiologist next week. Has obsessive-compulsive tendencies. Has not discussed at length with her counselor. Her current obsession is with taking Tylenol 500 mg 3 pills 6 times per day although this does not help her pain at all. Mentally patient feels she must take medication although it is of no pharmacological benefit.  Objective:   BP 126/68  Ht 5\' 5"  (1.651 m)  Wt 210 lb (95.255 kg)  BMI 34.95 kg/m2 NAD. Alert, oriented. Mildly anxious affect. Lungs clear. Heart regular rate rhythm.  Assessment: Fibromyalgia  High risk medication use - Plan: Hepatic function panel, Hepatic function panel  Obsessive compulsive disorder  Plan: Meds ordered this encounter  Medications  . acetaminophen (TYLENOL) 500 MG tablet    Sig: Take 1,500 mg by mouth daily.  Marland Kitchen gabapentin (NEURONTIN) 300 MG capsule    Sig: 2 po q am; one in the afternoon; 2 po q pm    Dispense:  150 capsule    Refill:  2    Order Specific Question:  Supervising Provider    Answer:  Merlyn Albert [[id number]]   Slowly titrate Neurontin dose as directed. Stop at dose if any adverse affects. Liver profile pending due to excessive Tylenol use. Strongly recommend the patient cutback use to no more than 3200 mg per day. Patient is currently carrying around a very large bottle of acetaminophen. Patient is to give this to her mother so dosing can be control. Also strongly recommend that she discuss her OCD issues with her counselor at next visit. Recheck in 3 months, call back sooner if any problems.

## 2013-03-08 ENCOUNTER — Ambulatory Visit (HOSPITAL_COMMUNITY): Payer: Self-pay | Admitting: Psychiatry

## 2013-03-09 NOTE — Progress Notes (Signed)
[facility] - voicemail full 03/04/13, 03/09/13

## 2013-03-10 ENCOUNTER — Ambulatory Visit (INDEPENDENT_AMBULATORY_CARE_PROVIDER_SITE_OTHER): Payer: Medicaid Other | Admitting: Gastroenterology

## 2013-03-10 ENCOUNTER — Encounter: Payer: Self-pay | Admitting: Gastroenterology

## 2013-03-10 ENCOUNTER — Ambulatory Visit: Payer: Medicaid Other | Admitting: Nurse Practitioner

## 2013-03-10 VITALS — BP 117/79 | HR 108 | Temp 98.1°F | Ht 65.0 in | Wt 211.0 lb

## 2013-03-10 DIAGNOSIS — K219 Gastro-esophageal reflux disease without esophagitis: Secondary | ICD-10-CM

## 2013-03-10 DIAGNOSIS — K602 Anal fissure, unspecified: Secondary | ICD-10-CM | POA: Insufficient documentation

## 2013-03-10 DIAGNOSIS — K2970 Gastritis, unspecified, without bleeding: Secondary | ICD-10-CM | POA: Insufficient documentation

## 2013-03-10 MED ORDER — NITROGLYCERIN 0.4 % RE OINT: TOPICAL_OINTMENT | RECTAL | Status: DC

## 2013-03-10 NOTE — Patient Instructions (Addendum)
CONTINUE YOUR WEIGHT LOSS EFFORTS. YOU LOST 5 LBS SINCE FEB 2014.  USE RECTIV AS NEEDED FOR RIPPING/TEARING RECTAL PAIN AND BLEEDING. USE A SMALL AMOUNT. IT CAN CAUSE YOU TO PASS OUT OR HAVE HEADACHES.  CALL WITH QUESTIONS OR CONCERNS REGARDING RECTAL BLEEDING AND PT WILL NEED COMPLETE COLONOSCOPY.  FOLLOW A LOW FAT DIET. SEE INFO BELOW.  CONTINUE DEXILANT.   FOLLOW UP IN 1 YEAR. MERRY CHRISTMAS AND HAPPY NEW YEAR!   Low-Fat Diet BREADS, CEREALS, PASTA, RICE, DRIED PEAS, AND BEANS These products are high in carbohydrates and most are low in fat. Therefore, they can be increased in the diet as substitutes for fatty foods. They too, however, contain calories and should not be eaten in excess. Cereals can be eaten for snacks as well as for breakfast.   FRUITS AND VEGETABLES It is good to eat fruits and vegetables. Besides being sources of fiber, both are rich in vitamins and some minerals. They help you get the daily allowances of these nutrients. Fruits and vegetables can be used for snacks and desserts.  MEATS Limit lean meat, chicken, [place], and fish to no more than 6 ounces per day. Beef, Pork, and Lamb Use lean cuts of beef, pork, and lamb. Lean cuts include:  Extra-lean ground beef.  Arm roast.  Sirloin tip.  Center-cut ham.  Round steak.  Loin chops.  Rump roast.  Tenderloin.  Trim all fat off the outside of meats before cooking. It is not necessary to severely decrease the intake of red meat, but lean choices should be made. Lean meat is rich in protein and contains a highly absorbable form of iron. Premenopausal women, in particular, should avoid reducing lean red meat because this could increase the risk for low red blood cells (iron-deficiency anemia).  Chicken and [place] These are good sources of protein. The fat of poultry can be reduced by removing the skin and underlying fat layers before cooking. Chicken and [place] can be substituted for lean red meat in the diet.  Poultry should not be fried or covered with high-fat sauces. Fish and Shellfish Fish is a good source of protein. Shellfish contain cholesterol, but they usually are low in saturated fatty acids. The preparation of fish is important. Like chicken and [place], they should not be fried or covered with high-fat sauces. EGGS Egg whites contain no fat or cholesterol. They can be eaten often. Try 1 to 2 egg whites instead of whole eggs in recipes or use egg substitutes that do not contain yolk. MILK AND DAIRY PRODUCTS Use skim or 1% milk instead of 2% or whole milk. Decrease whole milk, natural, and processed cheeses. Use nonfat or low-fat (2%) cottage cheese or low-fat cheeses made from vegetable oils. Choose nonfat or low-fat (1 to 2%) yogurt. Experiment with evaporated skim milk in recipes that call for heavy cream. Substitute low-fat yogurt or low-fat cottage cheese for sour cream in dips and salad dressings. Have at least 2 servings of low-fat dairy products, such as 2 glasses of skim (or 1%) milk each day to help get your daily calcium intake. FATS AND OILS Reduce the total intake of fats, especially saturated fat. Butterfat, lard, and beef fats are high in saturated fat and cholesterol. These should be avoided as much as possible. Vegetable fats do not contain cholesterol, but certain vegetable fats, such as coconut oil, palm oil, and palm kernel oil are very high in saturated fats. These should be limited. These fats are often used in [employer],  processed foods, popcorn, oils, and nondairy creamers. Vegetable shortenings and some peanut butters contain hydrogenated oils, which are also saturated fats. Read the labels on these foods and check for saturated vegetable oils. Unsaturated vegetable oils and fats do not raise blood cholesterol. However, they should be limited because they are fats and are high in calories. Total fat should still be limited to 30% of your daily caloric intake. Desirable liquid  vegetable oils are corn oil, cottonseed oil, olive oil, canola oil, safflower oil, soybean oil, and sunflower oil. Peanut oil is not as good, but small amounts are acceptable. Buy a heart-healthy tub margarine that has no partially hydrogenated oils in the ingredients. Mayonnaise and salad dressings often are made from unsaturated fats, but they should also be limited because of their high calorie and fat content. Seeds, nuts, peanut butter, olives, and avocados are high in fat, but the fat is mainly the unsaturated type. These foods should be limited mainly to avoid excess calories and fat. OTHER EATING TIPS Snacks  Most sweets should be limited as snacks. They tend to be rich in calories and fats, and their caloric content outweighs their nutritional value. Some good choices in snacks are graham crackers, melba toast, soda crackers, bagels (no egg), English muffins, fruits, and vegetables. These snacks are preferable to snack crackers, [place] fries, TORTILLA CHIPS, and POTATO chips. Popcorn should be air-popped or cooked in small amounts of liquid vegetable oil. Desserts Eat fruit, low-fat yogurt, and fruit ices instead of pastries, cake, and cookies. Sherbet, angel food cake, gelatin dessert, frozen low-fat yogurt, or other frozen products that do not contain saturated fat (pure fruit juice bars, frozen ice pops) are also acceptable.  COOKING METHODS Choose those methods that use little or no fat. They include: Poaching.  Braising.  Steaming.  Grilling.  Baking.  Stir-frying.  Broiling.  Microwaving.  Foods can be cooked in a nonstick pan without added fat, or use a nonfat cooking spray in regular cookware. Limit fried foods and avoid frying in saturated fat. Add moisture to lean meats by using water, broth, cooking wines, and other nonfat or low-fat sauces along with the cooking methods mentioned above. Soups and stews should be chilled after cooking. The fat that forms on top after a few hours  in the refrigerator should be skimmed off. When preparing meals, avoid using excess salt. Salt can contribute to raising blood pressure in some people.  EATING AWAY FROM HOME Order entres, potatoes, and vegetables without sauces or butter. When meat exceeds the size of a deck of cards (3 to 4 ounces), the rest can be taken home for another meal. Choose vegetable or fruit salads and ask for low-calorie salad dressings to be served on the side. Use dressings sparingly. Limit high-fat toppings, such as bacon, crumbled eggs, cheese, sunflower seeds, and olives. Ask for heart-healthy tub margarine instead of butter.

## 2013-03-10 NOTE — Progress Notes (Signed)
Patient notified and verbalized understanding. 

## 2013-03-10 NOTE — Assessment & Plan Note (Signed)
ASSOCIATED WITH LARGE STOOLS/RECTAL BLEEDING.  RECTIV PRN. DISCUSSED BENEFITS, AND SIDE EFFECTS(SYNCOPE/HAs) WITH PT. CALL WITH QUESTIONS OR CONCERNS REGARD DIG RECTAL BLEEDING AND PT WILL NEED COMPLETE TCS. OPV IN 1 YR

## 2013-03-10 NOTE — Assessment & Plan Note (Signed)
SX CONTROLLED.  LOW FAT DIET CONTINUE DEXILANT OPV IN 12 MOS

## 2013-03-10 NOTE — Assessment & Plan Note (Signed)
SX CONTROLLED.  LOW FAT DIET CONTINUE DEXILANT OPV IN 12 MOS 

## 2013-03-10 NOTE — Progress Notes (Signed)
Subjective:    Patient ID: Kristin Stephens, female    DOB: Aug 18, 1985, 27 y.o.   MRN: [id number]  Harlow Asa, MD  HPI [facility] [place] HELPS. OUT 2 DAYS AND BOWEL FELT LIKE THEY WERE GOING TO ACT UP. NOW TAKING NEURONTIN FOR FIBROMYALGIA AT THE END OF OCT 2014. SOME RECTAL BLEEDING WHEN SHE HAS A LARGE BM(ON STOLL, WHEN SHE WIPES). NO RECTAL PRESSURE, PAIN, ITCHING, OR BURNING. GOES AWAY AND DOESN'T BOTHER HER. RARE DIARRHEA WITH CERTAIN FOODS. PT DENIES FEVER, CHILLS, BRBPR, nausea, vomiting, melena, constipation, abd pain, problems swallowing, OR heartburn or indigestion.  Past Medical History  Diagnosis Date  . Esophageal reflux   . IBS (irritable bowel syndrome)   . Anxiety   . Depression   . Bipolar affective disorder   . TMJ syndrome   . Asthma     daily and prn inhalers  . Deviated nasal septum 05/2012  . Nasal turbinate hypertrophy 05/2012    bilateral  . Dental crowns present   . Eczema     right leg  . Migraine headache   . Hypertension   . ADHD (attention deficit hyperactivity disorder)    Past Surgical History  Procedure Laterality Date  . Asd and vsd repair  1989  . Coarctation of aorta repair  1989  . Cesarean section  04/12/2009  . Esophagogastroduodenoscopy  04/04/2008      Normal esophagus/Mild patchy erythema in the antrum/ Normal duodenal bulb, normal small bowel biopsy  . Flexible sigmoidoscopy  04/04/2008      Small internal hemorrhoids (poor bowel prep)  . Cardiac catheterization  06/18/2006  . Nasal septoplasty w/ turbinoplasty Bilateral 05/31/2012    Procedure: NASAL SEPTOPLASTY WITH TURBINATE REDUCTION;  Surgeon: Darletta Moll, MD;  Location: [facility];  Service: ENT;  Laterality: Bilateral;    Allergies  Allergen Reactions  . Sulfonamide Derivatives Swelling    SWELLING OF EYES WITH OPHTHALMIC SULFA  . Coconut Flavor Rash    Current Outpatient Prescriptions  Medication Sig Dispense Refill  . acetaminophen (TYLENOL) 500 MG tablet Take  1,500 mg by mouth daily.      Marland Kitchen albuterol (PROVENTIL HFA;VENTOLIN HFA) 108 (90 BASE) MCG/ACT inhaler Inhale 1-2 puffs into the lungs every 6 (six) hours as needed for wheezing.    Marland Kitchen ALPRAZolam (XANAX) 0.5 MG tablet Take 1 tablet (0.5 mg total) by mouth at bedtime as needed for sleep.    Marland Kitchen amphetamine-dextroamphetamine (ADDERALL XR) 30 MG 24 hr capsule Take 1 capsule (30 mg total) by mouth daily.    Marland Kitchen amphetamine-dextroamphetamine (ADDERALL) 10 MG tablet Take one at 3 pm    . beclomethasone (QVAR) 80 MCG/ACT inhaler Inhale 2 puffs into the lungs 2 (two) times daily.    Marland Kitchen BIOTIN PO Take 20,000 mcg by mouth at bedtime.    . citalopram (CELEXA) 40 MG tablet Take 1 tablet (40 mg total) by mouth daily.    . clobetasol cream (TEMOVATE) 0.05 % Apply 1 application topically 2 (two) times daily as needed (eczema rash).    . DEXILANT 60 MG capsule TAKE 1 CAPSULE BY MOUTH ONCE DAILY.    . fluticasone (FLONASE) 50 MCG/ACT nasal spray Place 2 sprays into the nose daily.    Marland Kitchen gabapentin (NEURONTIN) 300 MG capsule 2 po q am; one in the afternoon; 2 po q pm 600/300/600   . hydroxypropyl methylcellulose (ISOPTO TEARS) 2.5 % ophthalmic solution Place 1 drop into both eyes as needed (dry eyes).    Marland Kitchen  lamoTRIgine (LAMICTAL) 200 MG tablet Take 1 tablet (200 mg total) by mouth at bedtime.    . medroxyPROGESTERone (DEPO-PROVERA) 150 MG/ML injection Inject 150 mg into the muscle every 3 (three) months. LAST DOSE OCT 2014   .      .      .      .      .          Review of Systems     Objective:   Physical Exam        Assessment & Plan:

## 2013-03-11 ENCOUNTER — Ambulatory Visit (INDEPENDENT_AMBULATORY_CARE_PROVIDER_SITE_OTHER): Payer: 59 | Admitting: Psychiatry

## 2013-03-11 DIAGNOSIS — F319 Bipolar disorder, unspecified: Secondary | ICD-10-CM

## 2013-03-11 NOTE — Progress Notes (Signed)
Patient:  Kristin Stephens   DOB: [birth year]  MR Number: [id number]  [facility]  [address] [place],  [place], [address]  Start: Friday 03/11/2013 11:20 AM End: Friday 03/11/2013 12:10 PM  Provider/Observer:     Florencia Reasons, MSW, LCSW   Chief Complaint:      Chief Complaint  Patient presents with  . Anxiety    Reason For Service:     The patient is a returning patient to this practice and reports increased symptoms of anxiety and depression. She has a history of bipolar disorder. Patient is seen today for follow up appointment.  Interventions Strategy:  Supportive therapy,cognitive behavioral therapy  Participation Level:   Active  Participation Quality:  Appropriate    Behavioral Observation:  Casual, Alert,  Talkative,  Current Psychosocial Factors: Patient's daughter continues to have health issues. Patient recently was diagnosed with fibromyalgia. 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 Patient's uncle has a terminal illness.  Content of Session:   Reviewing symptoms, processing feelings, identifying early signs of distress, reviewing relaxation techniques and ways to maintain consistency in self-care efforts to improve ability to manage stress.  Current Status:   The patient reports no depression but continues to experience anxiety and anger.  Patient Progress:    The patient is pleased she has successfully completed this semester. She now is on Christmas break. She continues to express resentment and anger regarding her physical and mental health conditions. Therapist works with patient to process her feelings and to identify coping statements. Therapist and patient discuss patient's strengths and ways to use to help her cope with her conditions and minimize stress. Therapist works with patient to identify early signs of stress for patient including her behaviors, thought patterns, and physical  sensations. Therapist and patient discuss relaxation techniques to use on a regular basis and ways to promote consistency including specific steps, start date, frequency, coping statements, and accountability tools.   Target Goals:   1. Increase understanding of mental disorder and improve self-care ; 1: 1 psychoeducation and cognitive behavior therapy, one time every 1-4 weeks     2. Improve communication skills (decrease yelling) and ability to identify and verbalize feelings; 1-1 psychotherapy ( supportive and cognitive therapy) one time every 1-4 weeks    3. Decrease anxiety and improve relaxation/coping techniques; 1-1 psychotherapy (supportive and cognitive behavioral therapy) one time every 1-4 weeks    4. Increase positive thought patterns and decreased negative thought patterns; 1:1 psychotherapy (supportive and cognitive therapy) one time every 1-4 weeks  Last Reviewed:   01/19/2012  Goals Addressed Today:    1,3,4  Impression/Diagnosis:   The patient has a long-standing history of symptoms of anxiety and depression along with mood swings beginning in early adolescence. Symptoms have worsened since the birth of her daughter in January 2011. Current symptoms now include anxiety, depressed mood, ruminating thoughts, irritability, mood swings, and crying spells. Diagnosis: bipolar 1 disorder  Diagnosis:  Axis I:  Bipolar disorder          Axis II: Deferred

## 2013-03-11 NOTE — Patient Instructions (Signed)
Discussed orally 

## 2013-03-14 ENCOUNTER — Ambulatory Visit (INDEPENDENT_AMBULATORY_CARE_PROVIDER_SITE_OTHER): Payer: 59 | Admitting: Psychiatry

## 2013-03-14 ENCOUNTER — Ambulatory Visit: Payer: Medicaid Other | Admitting: Nurse Practitioner

## 2013-03-14 ENCOUNTER — Encounter (HOSPITAL_COMMUNITY): Payer: Self-pay | Admitting: Psychiatry

## 2013-03-14 VITALS — BP 100/80 | Ht 65.0 in | Wt 212.0 lb

## 2013-03-14 DIAGNOSIS — Z8659 Personal history of other mental and behavioral disorders: Secondary | ICD-10-CM

## 2013-03-14 DIAGNOSIS — F988 Other specified behavioral and emotional disorders with onset usually occurring in childhood and adolescence: Secondary | ICD-10-CM

## 2013-03-14 DIAGNOSIS — F319 Bipolar disorder, unspecified: Secondary | ICD-10-CM

## 2013-03-14 MED ORDER — LAMOTRIGINE 200 MG PO TABS: 200.0000 mg | ORAL_TABLET | Freq: Every day | ORAL | Status: DC

## 2013-03-14 MED ORDER — AMPHETAMINE-DEXTROAMPHET ER 30 MG PO CP24: 30.0000 mg | ORAL_CAPSULE | Freq: Every day | ORAL | Status: DC

## 2013-03-14 MED ORDER — CITALOPRAM HYDROBROMIDE 40 MG PO TABS: 40.0000 mg | ORAL_TABLET | Freq: Every day | ORAL | Status: DC

## 2013-03-14 MED ORDER — AMPHETAMINE-DEXTROAMPHETAMINE 10 MG PO TABS: 10.0000 mg | ORAL_TABLET | Freq: Every day | ORAL | Status: DC | PRN

## 2013-03-14 MED ORDER — ALPRAZOLAM 0.5 MG PO TABS: 0.5000 mg | ORAL_TABLET | Freq: Every evening | ORAL | Status: DC | PRN

## 2013-03-14 MED ORDER — AMPHETAMINE-DEXTROAMPHETAMINE 10 MG PO TABS: ORAL_TABLET | ORAL | Status: DC

## 2013-03-14 NOTE — Progress Notes (Signed)
cc'd to pcp 

## 2013-03-14 NOTE — Progress Notes (Signed)
Patient ID: Kristin Stephens, female   DOB: 04-Sep-1985, 27 y.o.   MRN: [id number] Patient ID: Kristin Stephens, female   DOB: 1985-07-14, 27 y.o.   MRN: [id number] Patient ID: Kristin Stephens, female   DOB: June 01, 1985, 27 y.o.   MRN: [id number] [facility] [address] Progress Note Kristin Stephens MRN: [id number] DOB: 07-Feb-1986 Age: 27 y.o.  Date: 03/14/2013 Start Time: 9:02 AM End Time: 9:22 AM  Chief Complaint: Chief Complaint  Patient presents with  . Anxiety  . Depression  . Follow-up   Subjective: "I'm doing better."  This patient is a 27 year old single white female who lives with her mother and her 56 and a half-year-old daughter. She still dating the father of her baby. She is a Physicist, medical at [employer].  The patient states that she initially got depressed around age 27. She saw Peggy for counseling for a long time. Her teen years she double her depression by using alcohol marijuana and prescription pills she was also getting into self-mutilation. She was never psychiatrically hospitalized  The patient did well while she was pregnant with her daughter at age 27. However shortly after giving birth she got extremely depressed, she was unable to care for herself or the baby and had absolutely no interest in anything. She also began developing manic symptoms at times when she was very hyperactive "high", and didn't sleep or eat and was hyperalert. The physician had diagnosed her as being bipolar and she was placed on Lamictal.  The patient also has a history of ADHD that has been present since childhood. Adderall has been effective for this. The patient returns after 2 months. She states that she was recently diagnosed with fibromyalgia. She was taking a lot of Tylenol per day and obsessional way but she is now on Neurontin and has stopped doing this. Her pain is diminishing and she is sleeping better. She did well in  school and overall is able to focus better than she did. She is working hard with her counselor to try to control her obsessions and is also doing yoga and meditation. She continues to worry about her child with seizure disorder but is "trying to let go" in that the doctors manage it Current psychiatric medication Lamictal 200 mg daily Celexa 40 mg daily Adderall xr 30 mg every morning and Adderall 10 mg after school Xanax  0.5 mg each bedtime  Past psychiatric history Patient has been seeing in this office since April 2012 by Dr Lolly Mustache however she has been seeing therapist in this office for more than 10 years.  Patient endorse history of mood swing, depression, anger and poor attention and concentration.  Patient also endorse history of self abusive behavior and endorse cutting herself with a razor blade when her grandmother was dying.  In the past she was prescribed Wellbutrin but she was admitted on medical for the she never took the Wellbutrin.  Patient denies any history of suicidal attempt or any inpatient psychiatric treatment.  She had history of burst of energy with excessive shopping, cleaning, road rage and impulse eating.   Psychosocial history Patient was raised by her mother and grandmother when her father left when she was only 63 years old.  Patient told her father was never in her life and she was very close to her grandmother.  Patient has a lot of resentment and anger towards her father.  Patient grandmother died when she was only 27 years  old.  Patient has one daughter.  Patient has a very good relationship with her boyfriend whose been very supportive.  Family history family history includes ADD / ADHD in her brother, brother, and sister; Alcohol abuse in her father; Anxiety disorder in her brother; Bipolar disorder in her father and paternal grandmother; Dementia in her maternal grandmother and paternal grandmother; Depression in her mother; Emphysema in her paternal grandmother;  Heart disease in her maternal grandmother and paternal grandmother; Hyperlipidemia in her mother; Hypertension in her mother; Personality disorder in her father; Physical abuse in her mother; Prostate cancer in her maternal grandfather; Seizures in her daughter; Sexual abuse in her mother; Stroke in her maternal grandfather. There is no history of Drug abuse, OCD, Paranoid behavior, or Schizophrenia.  Alcohol and substance use history Patient endorse history of using marijuana and drugs and alcohol in her teens when she was self-medicating for her depression and anxiety.  However she began drinking heavily again in the last several months, particularly on weekends. She quit 90 days ago and is attending AA.  Patient denies any history of DWI, tremors or blackout.  Education and work history Patient has GED and currently she is enrolled in [employer]  She is hoping to finish her schooling in 2015.  Patient has a history of attention and concentration problem which was formally tested by psychologist in this office.    Medical history Patient has history of headache, GERD, allergic rhinitis, hypertension, irritable bowel syndrome and aortic Valve repaired.    Mental status examination Patient is well groomed and casually dressed. She is calm and cooperative.  She described her mood is better and her affect is improved from the past    She denies any active or passive suicidal thoughts or homicidal thoughts.  Her speech is fast but clear and coherent.  Her thought process is logical linear and goal-directed.  There no psychotic symptoms present at this time.  Her attention and concentration is good  She's oriented x3.  Her insight judgment and impulse control is okay.  Lab Results:  Results for orders placed in visit on 03/02/13 (from the past 8736 hour(s))  HEPATIC FUNCTION PANEL   Collection Time    03/02/13  2:35 PM      Result Value Range   Total Bilirubin 0.5  0.3 - 1.2 mg/dL   Bilirubin, Direct 0.1   0.0 - 0.3 mg/dL   Indirect Bilirubin 0.4  0.0 - 0.9 mg/dL   Alkaline Phosphatase 85  39 - 117 U/L   AST 16  0 - 37 U/L   ALT 14  0 - 35 U/L   Total Protein 7.2  6.0 - 8.3 g/dL   Albumin 4.5  3.5 - 5.2 g/dL  Results for orders placed in visit on 02/03/13 (from the past 8736 hour(s))  SEDIMENTATION RATE   Collection Time    02/03/13  3:46 PM      Result Value Range   Sed Rate 22  0 - 22 mm/hr  ANA   Collection Time    02/03/13  3:46 PM      Result Value Range   ANA NEG  NEGATIVE  RHEUMATOID FACTOR   Collection Time    02/03/13  3:46 PM      Result Value Range   Rheumatoid Factor <10  <=14 IU/mL  B. BURGDORFI ANTIBODIES   Collection Time    02/03/13  3:50 PM      Result Value Range   B burgdorferi Ab  IgG+IgM 0.11    Results for orders placed during the hospital encounter of 10/11/12 (from the past 8736 hour(s))  URINALYSIS, ROUTINE W REFLEX MICROSCOPIC   Collection Time    10/11/12  3:42 AM      Result Value Range   Color, Urine YELLOW  YELLOW   APPearance CLEAR  CLEAR   Specific Gravity, Urine 1.025  1.005 - 1.030   pH 6.5  5.0 - 8.0   Glucose, UA NEGATIVE  NEGATIVE mg/dL   Hgb urine dipstick TRACE (*) NEGATIVE   Bilirubin Urine SMALL (*) NEGATIVE   Ketones, ur 15 (*) NEGATIVE mg/dL   Protein, ur NEGATIVE  NEGATIVE mg/dL   Urobilinogen, UA 0.2  0.0 - 1.0 mg/dL   Nitrite NEGATIVE  NEGATIVE   Leukocytes, UA NEGATIVE  NEGATIVE  PREGNANCY, URINE   Collection Time    10/11/12  3:42 AM      Result Value Range   Preg Test, Ur NEGATIVE  NEGATIVE  URINE MICROSCOPIC-ADD ON   Collection Time    10/11/12  3:42 AM      Result Value Range   Squamous Epithelial / LPF MANY (*) RARE   WBC, UA 0-2  <3 WBC/hpf   RBC / HPF 0-2  <3 RBC/hpf   Bacteria, UA MANY (*) RARE  CBC WITH DIFFERENTIAL   Collection Time    10/11/12  4:11 AM      Result Value Range   WBC 8.1  4.0 - 10.5 K/uL   RBC 4.67  3.87 - 5.11 MIL/uL   Hemoglobin 13.6  12.0 - 15.0 g/dL   HCT 16.1  09.6 - 04.5 %    MCV 83.9  78.0 - 100.0 fL   MCH 29.1  26.0 - 34.0 pg   MCHC 34.7  30.0 - 36.0 g/dL   RDW 40.9  81.1 - 91.4 %   Platelets 299  150 - 400 K/uL   Neutrophils Relative % 82 (*) 43 - 77 %   Neutro Abs 6.7  1.7 - 7.7 K/uL   Lymphocytes Relative 12  12 - 46 %   Lymphs Abs 1.0  0.7 - 4.0 K/uL   Monocytes Relative 5  3 - 12 %   Monocytes Absolute 0.4  0.1 - 1.0 K/uL   Eosinophils Relative 0  0 - 5 %   Eosinophils Absolute 0.0  0.0 - 0.7 K/uL   Basophils Relative 0  0 - 1 %   Basophils Absolute 0.0  0.0 - 0.1 K/uL  BASIC METABOLIC PANEL   Collection Time    10/11/12  4:11 AM      Result Value Range   Sodium 141  135 - 145 mEq/L   Potassium 3.8  3.5 - 5.1 mEq/L   Chloride 107  96 - 112 mEq/L   CO2 24  19 - 32 mEq/L   Glucose, Bld 120 (*) 70 - 99 mg/dL   BUN 5 (*) 6 - 23 mg/dL   Creatinine, Ser 7.82  0.50 - 1.10 mg/dL   Calcium 9.1  8.4 - 95.6 mg/dL   GFR calc non Af Amer >90  >90 mL/min   GFR calc Af Amer >90  >90 mL/min  LIPASE, BLOOD   Collection Time    10/11/12  4:11 AM      Result Value Range   Lipase 26  11 - 59 U/L  Results for orders placed during the hospital encounter of 05/31/12 (from the past 8736 hour(s))  POCT HEMOGLOBIN-HEMACUE  Collection Time    05/31/12  8:30 AM      Result Value Range   Hemoglobin 12.1  12.0 - 15.0 g/dL  Family doctor draws these  Assessment Axis I bipolar 1 disorder, history of postpartum depression, history of ADHD Axis II deferred Axis III see medical history Axis IV moderate Axis V 60-65  Plan: I took her vitals.  I reviewed CC, tobacco/med/surg Hx, meds effects/ side effects, problem list, therapies and responses as well as current situation/symptoms discussed options. She will continue her current effective medications. Return to clinic in 2 months but call if her depression worsen sooner See orders and pt instructions for more details.  MEDICATIONS this encounter: Meds ordered this encounter  Medications  . citalopram (CELEXA)  40 MG tablet    Sig: Take 1 tablet (40 mg total) by mouth daily.    Dispense:  30 tablet    Refill:  2  . lamoTRIgine (LAMICTAL) 200 MG tablet    Sig: Take 1 tablet (200 mg total) by mouth at bedtime.    Dispense:  30 tablet    Refill:  2  . ALPRAZolam (XANAX) 0.5 MG tablet    Sig: Take 1 tablet (0.5 mg total) by mouth at bedtime as needed for sleep.    Dispense:  30 tablet    Refill:  2  . amphetamine-dextroamphetamine (ADDERALL XR) 30 MG 24 hr capsule    Sig: Take 1 capsule (30 mg total) by mouth daily.    Dispense:  30 capsule    Refill:  0  . amphetamine-dextroamphetamine (ADDERALL XR) 30 MG 24 hr capsule    Sig: Take 1 capsule (30 mg total) by mouth daily.    Dispense:  30 capsule    Refill:  0    Do not fill before 04/14/13  . amphetamine-dextroamphetamine (ADDERALL) 10 MG tablet    Sig: Take 1 tablet (10 mg total) by mouth daily as needed (after 3pm for alertness).    Dispense:  30 tablet    Refill:  0  . amphetamine-dextroamphetamine (ADDERALL) 10 MG tablet    Sig: Take one at 3 pm    Dispense:  30 tablet    Refill:  0    Do not fill before 04/14/13    Medical Decision Making Problem Points:  Established problem, stable/improving (1), Review of last therapy session (1) and Review of psycho-social stressors (1) Data Points:  Review or order clinical lab tests (1) Review of medication regiment & side effects (2)  I certify that outpatient services furnished can reasonably be expected to improve the patient's condition.   Kristin Ruder, MD

## 2013-03-15 NOTE — Progress Notes (Signed)
Reminder in computer

## 2013-03-16 ENCOUNTER — Ambulatory Visit (HOSPITAL_COMMUNITY): Payer: Medicaid Other | Attending: Internal Medicine | Admitting: Cardiology

## 2013-03-16 DIAGNOSIS — I079 Rheumatic tricuspid valve disease, unspecified: Secondary | ICD-10-CM | POA: Insufficient documentation

## 2013-03-16 DIAGNOSIS — I359 Nonrheumatic aortic valve disorder, unspecified: Secondary | ICD-10-CM | POA: Insufficient documentation

## 2013-03-16 DIAGNOSIS — Q251 Coarctation of aorta: Secondary | ICD-10-CM

## 2013-03-16 DIAGNOSIS — Z8774 Personal history of (corrected) congenital malformations of heart and circulatory system: Secondary | ICD-10-CM

## 2013-03-16 DIAGNOSIS — Z9889 Other specified postprocedural states: Secondary | ICD-10-CM | POA: Insufficient documentation

## 2013-03-16 NOTE — Progress Notes (Signed)
Echo performed. 

## 2013-03-17 ENCOUNTER — Ambulatory Visit: Payer: Medicaid Other | Admitting: Nurse Practitioner

## 2013-03-18 ENCOUNTER — Ambulatory Visit (INDEPENDENT_AMBULATORY_CARE_PROVIDER_SITE_OTHER): Payer: Medicaid Other | Admitting: Internal Medicine

## 2013-03-18 VITALS — BP 124/72 | HR 98 | Ht 65.0 in | Wt 211.0 lb

## 2013-03-18 DIAGNOSIS — Q251 Coarctation of aorta: Secondary | ICD-10-CM

## 2013-03-18 NOTE — Patient Instructions (Signed)
Your physician wants you to follow-up in: 2 YEARS WITH DR ROSS You will receive a reminder letter in the mail two months in advance. If you don't receive a letter, please call our office to schedule the follow-up appointment.  

## 2013-03-18 NOTE — Progress Notes (Signed)
HPI Patient is a 27 year old with a history of ASD/VSD/Coarctation repair.  I saw hier in clinic in August 2013.  Previous echo was in 2011 Since seen she denies CP  Breathing is OK  Diagnosed with fibromyalgia  Being seen in [place]. Denies palpitations.   Admits she has to get more active.  Starting yoga.  Had echo 2 days ago  Allergies  Allergen Reactions  . Sulfonamide Derivatives Swelling    SWELLING OF EYES WITH OPHTHALMIC SULFA  . Coconut Flavor Rash    Current Outpatient Prescriptions  Medication Sig Dispense Refill  . acetaminophen (TYLENOL) 500 MG tablet Take 1,500 mg by mouth daily.      Marland Kitchen albuterol (PROVENTIL HFA;VENTOLIN HFA) 108 (90 BASE) MCG/ACT inhaler Inhale 1-2 puffs into the lungs every 6 (six) hours as needed for wheezing.      Marland Kitchen ALPRAZolam (XANAX) 0.5 MG tablet Take 1 tablet (0.5 mg total) by mouth at bedtime as needed for sleep.  30 tablet  2  . amoxicillin (AMOXIL) 500 MG capsule Take 2,000 mg by mouth as needed (1 hrs prior to dental work).      Marland Kitchen amphetamine-dextroamphetamine (ADDERALL XR) 30 MG 24 hr capsule Take 1 capsule (30 mg total) by mouth daily.  30 capsule  0  . amphetamine-dextroamphetamine (ADDERALL XR) 30 MG 24 hr capsule Take 1 capsule (30 mg total) by mouth daily.  30 capsule  0  . amphetamine-dextroamphetamine (ADDERALL) 10 MG tablet Take one at 3 pm  30 tablet  0  . beclomethasone (QVAR) 80 MCG/ACT inhaler Inhale 2 puffs into the lungs 2 (two) times daily.      Marland Kitchen BIOTIN PO Take 20,000 mcg by mouth at bedtime.      . citalopram (CELEXA) 40 MG tablet Take 1 tablet (40 mg total) by mouth daily.  30 tablet  2  . clobetasol cream (TEMOVATE) 0.05 % Apply 1 application topically 2 (two) times daily as needed (eczema rash).      . DEXILANT 60 MG capsule TAKE 1 CAPSULE BY MOUTH ONCE DAILY.  31 capsule  5  . fluticasone (FLONASE) 50 MCG/ACT nasal spray Place 2 sprays into the nose daily.  16 g  11  . gabapentin (NEURONTIN) 300 MG capsule 2 po q am; one in the  afternoon; 2 po q pm  150 capsule  2  . hydroxypropyl methylcellulose (ISOPTO TEARS) 2.5 % ophthalmic solution Place 1 drop into both eyes as needed (dry eyes).      Marland Kitchen lamoTRIgine (LAMICTAL) 200 MG tablet Take 1 tablet (200 mg total) by mouth at bedtime.  30 tablet  2  . medroxyPROGESTERone (DEPO-PROVERA) 150 MG/ML injection Inject 150 mg into the muscle every 3 (three) months.      . Nitroglycerin 0.4 % OINT 1/2 PEA SIZE AMOUNT TO RECTUM BID TO TREAT RECTAL PAIN/BLEEDING  30 g  0   No current facility-administered medications for this visit.    Past Medical History  Diagnosis Date  . Esophageal reflux   . IBS (irritable bowel syndrome)   . Anxiety   . Depression   . Bipolar affective disorder   . TMJ syndrome   . Asthma     daily and prn inhalers  . Deviated nasal septum 05/2012  . Nasal turbinate hypertrophy 05/2012    bilateral  . Dental crowns present   . Eczema     right leg  . Migraine headache   . Hypertension   . ADHD (attention deficit hyperactivity  disorder)   . Fibromyalgia     Past Surgical History  Procedure Laterality Date  . Asd and vsd repair  1989  . Coarctation of aorta repair  1989  . Cesarean section  04/12/2009  . Esophagogastroduodenoscopy  04/04/2008      Normal esophagus/Mild patchy erythema in the antrum/ Normal duodenal bulb, normal small bowel biopsy  . Flexible sigmoidoscopy  04/04/2008      Small internal hemorrhoids (poor bowel prep)  . Cardiac catheterization  06/18/2006  . Nasal septoplasty w/ turbinoplasty Bilateral 05/31/2012    Procedure: NASAL SEPTOPLASTY WITH TURBINATE REDUCTION;  Surgeon: Darletta Moll, MD;  Location: [facility];  Service: ENT;  Laterality: Bilateral;    Family History  Problem Relation Age of Onset  . Hyperlipidemia Mother   . Hypertension Mother   . Depression Mother   . Physical abuse Mother   . Sexual abuse Mother     possibly  . Emphysema Paternal Grandmother   . Heart disease Paternal Grandmother    . Bipolar disorder Paternal Grandmother   . Dementia Paternal Grandmother   . Heart disease Maternal Grandmother   . Dementia Maternal Grandmother   . Stroke Maternal Grandfather   . Prostate cancer Maternal Grandfather   . Personality disorder Father   . Bipolar disorder Father   . Alcohol abuse Father   . ADD / ADHD Sister   . Anxiety disorder Brother   . ADD / ADHD Brother   . ADD / ADHD Brother   . Seizures Daughter   . Drug abuse Neg Hx   . OCD Neg Hx   . Paranoid behavior Neg Hx   . Schizophrenia Neg Hx     History   Social History  . Marital Status: Single    Spouse Name: N/A    Number of Children: 1  . Years of Education: N/A   Occupational History  . 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 Smoking status: Current Some Day Smoker -- 0.50 packs/day for 10 years    Types: Cigarettes    Last Attempt to Quit: 04/01/2011  . Smokeless tobacco: Former Neurosurgeon    Quit date: 05/01/2012     Comment: 3-6 cigs a day during for finals week as of 07/18/2012  . Alcohol Use: No     Comment: 1-2 x/month  . Drug Use: No     Comment: marijuana and pain pills; 12/08/07-smoked x 1 month ago- this was the first time in 2.years.  Morphine Sulfate and oxycontin - none since age 74  . Sexual Activity: Yes    Birth Control/ Protection: Pill   Other Topics Concern  . Not on file   Social History Narrative   Lives with mother and 2 1/2 yr old daughter.     Review of Systems:  All systems reviewed.  They are negative to the above problem except as previously stated.  Vital Signs: BP 124/72  Pulse 98  Ht 5\' 5"  (1.651 m)  Wt 211 lb (95.709 kg)  BMI 35.11 kg/m2  Physical Exam Patient is in NAD HEENT:  Normocephalic, atraumatic. EOMI, PERRLA.  Neck: JVP is normal.  No bruits.  Lungs: clear to auscultation. No rales no wheezes.  Heart: Regular rate and rhythm. Normal S1, S2. No S3.   No significant murmurs. PMI not displaced.  Abdomen:  Supple,  nontender. Normal bowel sounds. No masses. No hepatomegaly.  Extremities:   Good distal pulses  throughout. No lower extremity edema.  Musculoskeletal :moving all extremities.  Neuro:   alert and oriented x3.  CN II-XII grossly intact.  EKG:  SR  82 bpm. Assessment and Plan:  1.  Congenital heart disease.  Patient doing well from a cardiac standpoint. Echo shows rel stable gradient through region of coarc repair.   2.  AOrtic insuffic  Eccentric  Overall I think mild to moderate  Stable from last echo

## 2013-03-29 ENCOUNTER — Telehealth: Payer: Self-pay | Admitting: Pulmonary Disease

## 2013-03-29 MED ORDER — BECLOMETHASONE DIPROPIONATE 80 MCG/ACT IN AERS: 2.0000 | INHALATION_SPRAY | Freq: Two times a day (BID) | RESPIRATORY_TRACT | Status: DC

## 2013-03-29 MED ORDER — ALBUTEROL SULFATE HFA 108 (90 BASE) MCG/ACT IN AERS: 1.0000 | INHALATION_SPRAY | Freq: Four times a day (QID) | RESPIRATORY_TRACT | Status: DC | PRN

## 2013-03-29 NOTE — Telephone Encounter (Signed)
ATC PT NA WCB--rx sent

## 2013-03-29 NOTE — Telephone Encounter (Signed)
I called and spoke with pt. She is aware rx's have been sent. Nothing further needed

## 2013-04-11 ENCOUNTER — Ambulatory Visit (INDEPENDENT_AMBULATORY_CARE_PROVIDER_SITE_OTHER): Payer: Medicaid Other | Admitting: Nurse Practitioner

## 2013-04-11 ENCOUNTER — Encounter: Payer: Self-pay | Admitting: Nurse Practitioner

## 2013-04-11 VITALS — BP 122/70 | Temp 98.5°F | Ht 65.0 in | Wt 210.0 lb

## 2013-04-11 DIAGNOSIS — J309 Allergic rhinitis, unspecified: Secondary | ICD-10-CM

## 2013-04-11 DIAGNOSIS — J300 Vasomotor rhinitis: Secondary | ICD-10-CM

## 2013-04-11 MED ORDER — METHYLPREDNISOLONE ACETATE 40 MG/ML IJ SUSP
40.0000 mg | Freq: Once | INTRAMUSCULAR | Status: AC
  Administered 2013-04-11: 40 mg via INTRAMUSCULAR

## 2013-04-11 MED ORDER — LORATADINE 10 MG PO TABS: 10.0000 mg | ORAL_TABLET | Freq: Every day | ORAL | Status: DC | PRN

## 2013-04-11 MED ORDER — MOMETASONE FUROATE 50 MCG/ACT NA SUSP: 2.0000 | Freq: Every day | NASAL | Status: DC

## 2013-04-11 NOTE — Patient Instructions (Signed)
[email or web address] 

## 2013-04-15 ENCOUNTER — Encounter (HOSPITAL_COMMUNITY): Payer: Self-pay | Admitting: Emergency Medicine

## 2013-04-15 ENCOUNTER — Emergency Department (HOSPITAL_COMMUNITY)
Admission: EM | Admit: 2013-04-15 | Discharge: 2013-04-15 | Disposition: A | Discharge status: Home / Self Care | Payer: Medicaid Other | Attending: Emergency Medicine | Admitting: Emergency Medicine

## 2013-04-15 ENCOUNTER — Encounter: Payer: Self-pay | Admitting: Nurse Practitioner

## 2013-04-15 DIAGNOSIS — F319 Bipolar disorder, unspecified: Secondary | ICD-10-CM | POA: Insufficient documentation

## 2013-04-15 DIAGNOSIS — J45909 Unspecified asthma, uncomplicated: Secondary | ICD-10-CM | POA: Insufficient documentation

## 2013-04-15 DIAGNOSIS — F411 Generalized anxiety disorder: Secondary | ICD-10-CM | POA: Insufficient documentation

## 2013-04-15 DIAGNOSIS — G43909 Migraine, unspecified, not intractable, without status migrainosus: Secondary | ICD-10-CM | POA: Insufficient documentation

## 2013-04-15 DIAGNOSIS — L259 Unspecified contact dermatitis, unspecified cause: Secondary | ICD-10-CM | POA: Insufficient documentation

## 2013-04-15 DIAGNOSIS — Z79899 Other long term (current) drug therapy: Secondary | ICD-10-CM | POA: Insufficient documentation

## 2013-04-15 DIAGNOSIS — IMO0001 Reserved for inherently not codable concepts without codable children: Secondary | ICD-10-CM | POA: Insufficient documentation

## 2013-04-15 DIAGNOSIS — Z98811 Dental restoration status: Secondary | ICD-10-CM | POA: Insufficient documentation

## 2013-04-15 DIAGNOSIS — I10 Essential (primary) hypertension: Secondary | ICD-10-CM | POA: Insufficient documentation

## 2013-04-15 DIAGNOSIS — Z791 Long term (current) use of non-steroidal anti-inflammatories (NSAID): Secondary | ICD-10-CM | POA: Insufficient documentation

## 2013-04-15 DIAGNOSIS — Z95818 Presence of other cardiac implants and grafts: Secondary | ICD-10-CM | POA: Insufficient documentation

## 2013-04-15 DIAGNOSIS — F909 Attention-deficit hyperactivity disorder, unspecified type: Secondary | ICD-10-CM | POA: Insufficient documentation

## 2013-04-15 DIAGNOSIS — Z9889 Other specified postprocedural states: Secondary | ICD-10-CM | POA: Insufficient documentation

## 2013-04-15 DIAGNOSIS — F17200 Nicotine dependence, unspecified, uncomplicated: Secondary | ICD-10-CM | POA: Insufficient documentation

## 2013-04-15 DIAGNOSIS — IMO0002 Reserved for concepts with insufficient information to code with codable children: Secondary | ICD-10-CM | POA: Insufficient documentation

## 2013-04-15 DIAGNOSIS — J300 Vasomotor rhinitis: Secondary | ICD-10-CM | POA: Insufficient documentation

## 2013-04-15 DIAGNOSIS — K219 Gastro-esophageal reflux disease without esophagitis: Secondary | ICD-10-CM | POA: Insufficient documentation

## 2013-04-15 MED ORDER — DIPHENHYDRAMINE HCL 50 MG/ML IJ SOLN
25.0000 mg | Freq: Once | INTRAMUSCULAR | Status: AC
  Administered 2013-04-15: 25 mg via INTRAVENOUS
  Filled 2013-04-15: qty 1

## 2013-04-15 MED ORDER — METOCLOPRAMIDE HCL 5 MG/ML IJ SOLN
10.0000 mg | Freq: Once | INTRAMUSCULAR | Status: AC
  Administered 2013-04-15: 10 mg via INTRAVENOUS
  Filled 2013-04-15: qty 2

## 2013-04-15 MED ORDER — SODIUM CHLORIDE 0.9 % IV BOLUS (SEPSIS)
1000.0000 mL | Freq: Once | INTRAVENOUS | Status: AC
  Administered 2013-04-15: 1000 mL via INTRAVENOUS

## 2013-04-15 MED ORDER — KETOROLAC TROMETHAMINE 30 MG/ML IJ SOLN
30.0000 mg | Freq: Once | INTRAMUSCULAR | Status: AC
  Administered 2013-04-15: 30 mg via INTRAVENOUS
  Filled 2013-04-15: qty 1

## 2013-04-15 MED ORDER — METOCLOPRAMIDE HCL 10 MG PO TABS: 10.0000 mg | ORAL_TABLET | Freq: Three times a day (TID) | ORAL | Status: DC | PRN

## 2013-04-15 MED ORDER — NAPROXEN 500 MG PO TABS: 500.0000 mg | ORAL_TABLET | Freq: Two times a day (BID) | ORAL | Status: DC

## 2013-04-15 NOTE — Progress Notes (Signed)
Subjective:  Presents complaints of cough and congestion for the past 2 weeks, began having head congestion and runny nose for the past 2 days. No fever. Mild off-and-on sinus pressure. Clear runny nose. No wheezing. Scratchy throat. Has a history of Afrin abuse, has not restarted using this.  Objective:   BP 122/70  Temp(Src) 98.5 F (36.9 C) (Oral)  Ht 5\' 5"  (1.651 m)  Wt 210 lb (95.255 kg)  BMI 34.95 kg/m2 NAD. Alert, oriented. TMs retracted, no erythema. Pharynx injected with clear PND noted. Neck supple with mild soft nontender adenopathy. Lungs clear. Heart regular rate rhythm.   Assessment:Vasomotor rhinitis - Plan: methylPREDNISolone acetate (DEPO-MEDROL) injection 40 mg  Plan: Meds ordered this encounter  Medications  . loratadine (CLARITIN) 10 MG tablet    Sig: Take 1 tablet (10 mg total) by mouth daily as needed for rhinitis.    Dispense:  30 tablet    Refill:  0    Order Specific Question:  Supervising Provider    Answer:  Mikey Kirschner [[id number]]  . methylPREDNISolone acetate (DEPO-MEDROL) injection 40 mg    Sig:   . mometasone (NASONEX) 50 MCG/ACT nasal spray    Sig: Place 2 sprays into the nose daily.    Dispense:  17 g    Refill:  5    Please dispense name brand med per Medicaid formulary    Order Specific Question:  Supervising Provider    Answer:  Mikey Kirschner [[id number]]   Reviewed symptomatic care warning signs. Call back if symptoms worsen or persist.

## 2013-04-15 NOTE — ED Provider Notes (Signed)
CSN: [id number]     Arrival date & time 04/15/13  0406 History   First MD Initiated Contact with Patient 04/15/13 [phone]     Chief Complaint  Patient presents with  . Migraine   (Consider location/radiation/quality/duration/timing/severity/associated sxs/prior Treatment) HPI Comments: 28 year old female, history of migraine disorder formally diagnosed in the past but has been headache free for some time after becoming pregnant. She states that she has had a headache that has been present for the last 24 hours, this is right-sided, frontal, retro-orbital and right temporal. This is a throbbing pain, nothing seems to make it better, worse with bright lights. It is associated with nausea but no vomiting. She denies fevers, chills, stiff neck, rashes and has no difficulty with ambulation, speech or vision. He states this feels similar to prior headaches it is just lasting longer. 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  Patient is a 28 y.o. female presenting with migraines. The history is provided by the patient.  Migraine    Past Medical History  Diagnosis Date  . Esophageal reflux   . IBS (irritable bowel syndrome)   . Anxiety   . Depression   . Bipolar affective disorder   . TMJ syndrome   . Asthma     daily and prn inhalers  . Deviated nasal septum 05/2012  . Nasal turbinate hypertrophy 05/2012    bilateral  . Dental crowns present   . Eczema     right leg  . Migraine headache   . Hypertension   . ADHD (attention deficit hyperactivity disorder)   . Fibromyalgia    Past Surgical History  Procedure Laterality Date  . Asd and vsd repair  1989  . Coarctation of aorta repair  1989  . Cesarean section  04/12/2009  . Esophagogastroduodenoscopy  04/04/2008      Normal esophagus/Mild patchy erythema in the antrum/ Normal duodenal bulb, normal small bowel biopsy  . Flexible sigmoidoscopy  04/04/2008       Small internal hemorrhoids (poor bowel prep)  . Cardiac catheterization  06/18/2006  . Nasal septoplasty w/ turbinoplasty Bilateral 05/31/2012    Procedure: NASAL SEPTOPLASTY WITH TURBINATE REDUCTION;  Surgeon: Ascencion Dike, MD;  Location: [facility];  Service: ENT;  Laterality: Bilateral;   Family History  Problem Relation Age of Onset  . Hyperlipidemia Mother   . Hypertension Mother   . Depression Mother   . Physical abuse Mother   . Sexual abuse Mother     possibly  . Emphysema Paternal Grandmother   . Heart disease Paternal Grandmother   . Bipolar disorder Paternal Grandmother   . Dementia Paternal Grandmother   . Heart disease Maternal Grandmother   . Dementia Maternal Grandmother   . Stroke Maternal Grandfather   . Prostate cancer Maternal Grandfather   . Personality disorder Father   . Bipolar disorder Father   . Alcohol abuse Father   . ADD / ADHD Sister   . Anxiety disorder Brother   . ADD / ADHD Brother   . ADD / ADHD Brother   . Seizures Daughter   . Drug abuse Neg Hx   . OCD Neg Hx   . Paranoid behavior Neg Hx   . Schizophrenia Neg Hx    History  Substance Use Topics  . Smoking status: Current Some Day Smoker -- 0.50 packs/day for 10 years    Types: Cigarettes  Last Attempt to Quit: 04/01/2011  . Smokeless tobacco: Former Systems developer    Quit date: 05/01/2012     Comment: 3-6 cigs a day during for finals week as of 07/18/2012  . 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    Allergies  Sulfonamide derivatives and Coconut flavor  Home Medications   Current Outpatient Rx  Name  Route  Sig  Dispense  Refill  . acetaminophen (TYLENOL) 500 MG tablet   Oral   Take 1,500 mg by mouth daily.         Marland Kitchen albuterol (PROVENTIL HFA;VENTOLIN HFA) 108 (90 BASE)  MCG/ACT inhaler   Inhalation   Inhale 1-2 puffs into the lungs every 6 (six) hours as needed for wheezing.   1 Inhaler   3   . ALPRAZolam (XANAX) 0.5 MG tablet   Oral   Take 1 tablet (0.5 mg total) by mouth at bedtime as needed for sleep.   30 tablet   2   . amphetamine-dextroamphetamine (ADDERALL XR) 30 MG 24 hr capsule   Oral   Take 1 capsule (30 mg total) by mouth daily.   30 capsule   0     Do not fill before 03/22/13   . amphetamine-dextroamphetamine (ADDERALL XR) 30 MG 24 hr capsule   Oral   Take 1 capsule (30 mg total) by mouth daily.   30 capsule   0     Do not fill before 04/14/13   . amphetamine-dextroamphetamine (ADDERALL) 10 MG tablet      Take one at 3 pm   30 tablet   0     Do not fill before 04/14/13   . beclomethasone (QVAR) 80 MCG/ACT inhaler   Inhalation   Inhale 2 puffs into the lungs 2 (two) times daily.   1 Inhaler   3   . citalopram (CELEXA) 40 MG tablet   Oral   Take 1 tablet (40 mg total) by mouth daily.   30 tablet   2   . clobetasol cream (TEMOVATE) 0.05 %   Topical   Apply 1 application topically 2 (two) times daily as needed (eczema rash).         . DEXILANT 60 MG capsule      TAKE 1 CAPSULE BY MOUTH ONCE DAILY.   31 capsule   5   . gabapentin (NEURONTIN) 300 MG capsule      2 po q am; one in the afternoon; 2 po q pm   150 capsule   2   . hydroxypropyl methylcellulose (ISOPTO TEARS) 2.5 % ophthalmic solution   Both Eyes   Place 1 drop into both eyes as needed (dry eyes).         Marland Kitchen lamoTRIgine (LAMICTAL) 200 MG tablet   Oral   Take 1 tablet (200 mg total) by mouth at bedtime.   30 tablet   2   . loratadine (CLARITIN) 10 MG tablet   Oral   Take 1 tablet (10 mg total) by mouth daily as needed for rhinitis.   30 tablet   0   . medroxyPROGESTERone (DEPO-PROVERA) 150 MG/ML injection   Intramuscular   Inject 150 mg into the muscle every 3 (  three) months.         . metoCLOPramide (REGLAN) 10 MG tablet    Oral   Take 1 tablet (10 mg total) by mouth 3 (three) times daily as needed for nausea (headache / nausea).   20 tablet   0   . mometasone (NASONEX) 50 MCG/ACT nasal spray   Nasal   Place 2 sprays into the nose daily.   17 g   5     Please dispense name brand med per Medicaid formul ...   . naproxen (NAPROSYN) 500 MG tablet   Oral   Take 1 tablet (500 mg total) by mouth 2 (two) times daily with a meal.   30 tablet   0   . Nitroglycerin 0.4 % OINT      1/2 PEA SIZE AMOUNT TO RECTUM BID TO TREAT RECTAL PAIN/BLEEDING   30 g   0    BP 124/75  Pulse 80  Temp(Src) 98.4 F (36.9 C) (Oral)  Resp 16  Ht 5\' 5"  (1.651 m)  Wt 208 lb (94.348 kg)  BMI 34.61 kg/m2  SpO2 99% Physical Exam  Nursing note and vitals reviewed. Constitutional: She appears well-developed and well-nourished. No distress.  HENT:  Head: Normocephalic and atraumatic.  Mouth/Throat: Oropharynx is clear and moist. No oropharyngeal exudate.  Tympanic membranes clear, nasal passages clear, oropharynx clear  Eyes: Conjunctivae and EOM are normal. Pupils are equal, round, and reactive to light. Right eye exhibits no discharge. Left eye exhibits no discharge. No scleral icterus.  Neck: Normal range of motion. Neck supple. No JVD present. No thyromegaly present.  Cardiovascular: Normal rate, regular rhythm, normal heart sounds and intact distal pulses.  Exam reveals no gallop and no friction rub.   No murmur heard. Pulmonary/Chest: Effort normal and breath sounds normal. No respiratory distress. She has no wheezes. She has no rales.  Abdominal: Soft. Bowel sounds are normal. She exhibits no distension and no mass. There is no tenderness.  Musculoskeletal: Normal range of motion. She exhibits no edema and no tenderness.  Lymphadenopathy:    She has no cervical adenopathy.  Neurological: She is alert. Coordination normal.  Speech is clear, cranial nerves III through XII are intact, memory is intact, strength is  normal in all 4 extremities including grips, sensation is intact to light touch and pinprick in all 4 extremities. no limb ataxia.  Skin: Skin is warm and dry. No rash noted. No erythema.  Psychiatric: She has a normal mood and affect. Her behavior is normal.    ED Course  Procedures (including critical care time) Labs Review Labs Reviewed - No data to display Imaging Review No results found.  EKG Interpretation   None       MDM   1. Migraine headache    The patient is having headache, migraine type, no focal neurologic deficits, no signs of pathologic headache including no stiff neck, fever, rash or focal neurologic symptoms. She is not vomiting. We'll try a headache cocktail including Toradol, Reglan, Benadryl and IV fluids. Patient is in agreement with the plan.  Pt has been sleeping after med - states that she feels great - ready for d/c.  Meds given in ED:  Medications  ketorolac (TORADOL) 30 MG/ML injection 30 mg (30 mg Intravenous Given 04/15/13 0455)  metoCLOPramide (REGLAN) injection 10 mg (10 mg Intravenous Given 04/15/13 0453)  diphenhydrAMINE (BENADRYL) injection 25 mg (25 mg Intravenous Given 04/15/13 0455)  sodium chloride 0.9 % bolus 1,000 mL (1,000 mLs Intravenous  New Bag/Given 04/15/13 0450)    New Prescriptions   METOCLOPRAMIDE (REGLAN) 10 MG TABLET    Take 1 tablet (10 mg total) by mouth 3 (three) times daily as needed for nausea (headache / nausea).   NAPROXEN (NAPROSYN) 500 MG TABLET    Take 1 tablet (500 mg total) by mouth 2 (two) times daily with a meal.      Johnna Acosta, MD 04/15/13 [phone]

## 2013-04-15 NOTE — ED Notes (Signed)
Pt alert & oriented x4, stable gait. Patient given discharge instructions, paperwork & prescription(s). Patient  instructed to stop at the registration desk to finish any additional paperwork. Patient verbalized understanding. Pt left department w/ no further questions. 

## 2013-04-15 NOTE — ED Notes (Signed)
Recurrent migraine, lasting longer than usual,  With associated nausea and photophobia

## 2013-04-15 NOTE — Assessment & Plan Note (Signed)
.   loratadine (CLARITIN) 10 MG tablet    Sig: Take 1 tablet (10 mg total) by mouth daily as needed for rhinitis.    Dispense:  30 tablet    Refill:  0    Order Specific Question:  Supervising Provider    Answer:  Mikey Kirschner [[id number]]  . methylPREDNISolone acetate (DEPO-MEDROL) injection 40 mg    Sig:   . mometasone (NASONEX) 50 MCG/ACT nasal spray    Sig: Place 2 sprays into the nose daily.    Dispense:  17 g    Refill:  5    Please dispense name brand med per Medicaid formulary    Order Specific Question:  Supervising Provider    Answer:  Mikey Kirschner [[id number]]   Reviewed symptomatic care warning signs. Call back if symptoms worsen or persist.

## 2013-04-15 NOTE — Discharge Instructions (Signed)
Headache: ° °You are having a headache. No specific cause was found today for your headache. It may have been a migraine or other cause of headache. Stress, anxiety, fatigue, and depression are common triggers for headaches. Your headache today does not appear to be life-threatening or require hospitalization, but often the exact cause of headaches is not determined in the emergency department. Therefore, followup with your doctor is very important to find out what may have caused your headache, and whether or not you need any further diagnostic testing or treatment. Sometimes headaches can appear benign but then more serious symptoms can develop which should prompt an immediate reevaluation by your doctor or the emergency department. ° °Seek immediate medical attention if: ° °· You develop possible problems with medications prescribed. °· The medications don't resolve your headache, if it recurs, or if you have multiple episodes of vomiting or can't take fluids by mouth °· You have a change from the usual headache. °· If you developed a sudden severe headache or confusion, become poorly responsive or faint, developed a fever above 100.4 or problems breathing, have a change in speech, vision, swallowing or understanding, or developed new weakness, numbness, tingling, incoordination or have a seizure. ° °If you don't have a family doctor to follow up with, see the follow up list below - call this morning for a follow-up appointment in the next 1-2 days. ° °

## 2013-04-21 ENCOUNTER — Ambulatory Visit (INDEPENDENT_AMBULATORY_CARE_PROVIDER_SITE_OTHER): Payer: Medicaid Other | Admitting: Nurse Practitioner

## 2013-04-21 ENCOUNTER — Encounter: Payer: Self-pay | Admitting: Nurse Practitioner

## 2013-04-21 VITALS — BP 130/84 | Ht 65.0 in | Wt 209.0 lb

## 2013-04-21 DIAGNOSIS — G43909 Migraine, unspecified, not intractable, without status migrainosus: Secondary | ICD-10-CM

## 2013-04-21 MED ORDER — KETOROLAC TROMETHAMINE 10 MG PO TABS
10.0000 mg | ORAL_TABLET | Freq: Four times a day (QID) | ORAL | Status: DC | PRN
Start: 2013-04-21 — End: 2014-07-18

## 2013-04-22 ENCOUNTER — Ambulatory Visit (HOSPITAL_COMMUNITY): Payer: Self-pay | Admitting: Psychiatry

## 2013-04-26 ENCOUNTER — Ambulatory Visit (HOSPITAL_COMMUNITY): Payer: Self-pay | Admitting: Psychiatry

## 2013-04-26 ENCOUNTER — Encounter: Payer: Self-pay | Admitting: Nurse Practitioner

## 2013-04-26 NOTE — Progress Notes (Signed)
Subjective:  Presents for recheck of migraines. Was seen in local ED on 1/16. Meds given at that time helped. Had another migraine on 1/20. Has a remote history of migraines. This is the first flare up she has had in a long time. Has not identified any specific triggers. Minimal head congestion. No fever. No cough. No change in symptomatology. Takes Naproxen which helps.  Objective:   BP 130/84  Ht 5\' 5"  (1.651 m)  Wt 209 lb (94.802 kg)  BMI 34.78 kg/m2 NAD. Alert, oriented. TMs minimal clear effusion. Pharynx clear. Neck supple with minimal adenopathy. Lungs clear. Heart RRR.  Assessment: MIGRAINE HEADACHE  Plan:  Meds ordered this encounter  Medications  . ketorolac (TORADOL) 10 MG tablet    Sig: Take 1 tablet (10 mg total) by mouth every 6 (six) hours as needed. Do not use more than 5 days continuously; max dose 40 mg in 24 hrs    Dispense:  20 tablet    Refill:  0    Order Specific Question:  Supervising Provider    Answer:  Mikey Kirschner [[id number]]  continue Naproxen as directed prn migraine. Will wait to see if this is a temporary exacerbation or persistent. Patient to call back if recurrent migraines or if new symptoms.

## 2013-04-26 NOTE — Assessment & Plan Note (Signed)
Meds ordered this encounter  Medications  . ketorolac (TORADOL) 10 MG tablet    Sig: Take 1 tablet (10 mg total) by mouth every 6 (six) hours as needed. Do not use more than 5 days continuously; max dose 40 mg in 24 hrs    Dispense:  20 tablet    Refill:  0    Order Specific Question:  Supervising Provider    Answer:  Mikey Kirschner [[id number]]  continue Naproxen as directed prn migraine. Will wait to see if this is a temporary exacerbation or persistent. Patient to call back if recurrent migraines or if new symptoms.

## 2013-04-27 ENCOUNTER — Ambulatory Visit (INDEPENDENT_AMBULATORY_CARE_PROVIDER_SITE_OTHER): Payer: 59 | Admitting: Psychiatry

## 2013-04-27 ENCOUNTER — Encounter (HOSPITAL_COMMUNITY): Payer: Self-pay | Admitting: Psychiatry

## 2013-04-27 VITALS — BP 110/80 | Ht 65.0 in | Wt 204.0 lb

## 2013-04-27 DIAGNOSIS — F319 Bipolar disorder, unspecified: Secondary | ICD-10-CM

## 2013-04-27 MED ORDER — CITALOPRAM HYDROBROMIDE 40 MG PO TABS: ORAL_TABLET | ORAL | Status: DC

## 2013-04-27 MED ORDER — AMPHETAMINE-DEXTROAMPHET ER 30 MG PO CP24
30.0000 mg | ORAL_CAPSULE | Freq: Every day | ORAL | Status: DC
Start: 2013-04-27 — End: 2013-05-30

## 2013-04-27 MED ORDER — AMPHETAMINE-DEXTROAMPHETAMINE 10 MG PO TABS: ORAL_TABLET | ORAL | Status: DC

## 2013-04-27 MED ORDER — LAMOTRIGINE 200 MG PO TABS: 200.0000 mg | ORAL_TABLET | Freq: Every day | ORAL | Status: DC

## 2013-04-27 NOTE — Progress Notes (Signed)
Patient ID: Kristin Stephens, female   DOB: 1985/09/30, 28 y.o.   MRN: [id number] Patient ID: Kristin Stephens, female   DOB: Aug 01, 1985, 28 y.o.   MRN: [id number] Patient ID: Kristin Stephens, female   DOB: [birth year], 28 y.o.   MRN: [id number] Patient ID: Kristin Stephens, female   DOB: [birth year], 28 y.o.   MRN: [id number] [facility] [id number] Progress Note Kristin Stephens MRN: [id number] DOB: 01/10/1986 Age: 28 y.o.  Date: 04/27/2013 Start Time: 9:02 AM End Time: 9:22 AM  Chief Complaint: Chief Complaint  Patient presents with  . Anxiety  . Depression  . ADHD  . Follow-up   Subjective: I've been down lately "  This patient is a 28 year old single white female who lives with her mother and her 25 and a half-year-old daughter. She still dating the father of her baby. She is a Charity fundraiser at [employer].  The patient states that she initially got depressed around age 51. She saw Peggy for counseling for a long time. Her teen years she double her depression by using alcohol marijuana and prescription pills she was also getting into self-mutilation. She was never psychiatrically hospitalized  The patient did well while she was pregnant with her daughter at age 55. However shortly after giving birth she got extremely depressed, she was unable to care for herself or the baby and had absolutely no interest in anything. She also began developing manic symptoms at times when she was very hyperactive "high", and didn't sleep or eat and was hyperalert. The physician had diagnosed her as being bipolar and she was placed on Lamictal.  The patient also has a history of ADHD that has been present since childhood. Adderall has been effective for this. The patient returns after 6 weeks. She's back a little bit early. She states over the last few weeks she's been depressed. Has had some very down days. For a couple of days she couldn't get  out of bed and missed school. She just didn't have the energy and was very sad. Her grandmother died on Christmas Eve 7 years ago and is still is bothering her. Her uncle is sick with cancer right now  The patient denies any suicidal ideation but doesn't like the feelings of recurrent depression. She has been keeping her grades but is isolating more and has thought about cutting herself but promises she would never act on it. We discussed going up on her Celexa and she is agreeable to Current psychiatric medication Lamictal 200 mg daily Celexa 40 mg daily Adderall xr 30 mg every morning and Adderall 10 mg after school Xanax  0.5 mg each bedtime  Past psychiatric history Patient has been seeing in this office since April 2012 by Dr Adele Schilder however she has been seeing therapist in this office for more than 10 years.  Patient endorse history of mood swing, depression, anger and poor attention and concentration.  Patient also endorse history of self abusive behavior and endorse cutting herself with a razor blade when her grandmother was dying.  In the past she was prescribed Wellbutrin but she was admitted on medical for the she never took the Wellbutrin.  Patient denies any history of suicidal attempt or any inpatient psychiatric treatment.  She had history of burst of energy with excessive shopping, cleaning, road rage and impulse eating.   Psychosocial history Patient was raised by her mother and grandmother when her father left when she was only  86 years old.  Patient told her father was never in her life and she was very close to her grandmother.  Patient has a lot of resentment and anger towards her father.  Patient grandmother died when she was only 43 years old.  Patient has one daughter.  Patient has a very good relationship with her boyfriend whose been very supportive.  Family history family history includes ADD / ADHD in her brother, brother, and sister; Alcohol abuse in her father; Anxiety  disorder in her brother; Bipolar disorder in her father and paternal grandmother; Dementia in her maternal grandmother and paternal grandmother; Depression in her mother; Emphysema in her paternal grandmother; Heart disease in her maternal grandmother and paternal grandmother; Hyperlipidemia in her mother; Hypertension in her mother; Personality disorder in her father; Physical abuse in her mother; Prostate cancer in her maternal grandfather; Seizures in her daughter; Sexual abuse in her mother; Stroke in her maternal grandfather. There is no history of Drug abuse, OCD, Paranoid behavior, or Schizophrenia.  Alcohol and substance use history Patient endorse history of using marijuana and drugs and alcohol in her teens when she was self-medicating for her depression and anxiety.  However she began drinking heavily again in the last several months, particularly on weekends. She quit 90 days ago and is attending AA.  Patient denies any history of DWI, tremors or blackout.  Education and work history Patient has GED and currently she is enrolled in [employer]  She is hoping to finish her schooling in 2015.  Patient has a history of attention and concentration problem which was formally tested by psychologist in this office.    Medical history Patient has history of headache, GERD, allergic rhinitis, hypertension, irritable bowel syndrome and aortic Valve repaired.    Mental status examination Patient is well groomed and casually dressed. She is calm and cooperative.  She described her mood is depressed and her affect is sad and tearful  She denies any active or passive suicidal thoughts or homicidal thoughts. She has had thoughts of cutting herself but claims she would never do it Her speech is  clear and coherent.  Her thought process is logical linear and goal-directed.  There no psychotic symptoms present at this time.  Her attention and concentration is good  She's oriented x3.  Her insight judgment and impulse  control is okay.  Lab Results:  Results for orders placed in visit on 03/02/13 (from the past 8736 hour(s))  HEPATIC FUNCTION PANEL   Collection Time    03/02/13  2:35 PM      Result Value Range   Total Bilirubin 0.5  0.3 - 1.2 mg/dL   Bilirubin, Direct 0.1  0.0 - 0.3 mg/dL   Indirect Bilirubin 0.4  0.0 - 0.9 mg/dL   Alkaline Phosphatase 85  39 - 117 U/L   AST 16  0 - 37 U/L   ALT 14  0 - 35 U/L   Total Protein 7.2  6.0 - 8.3 g/dL   Albumin 4.5  3.5 - 5.2 g/dL  Results for orders placed in visit on 02/03/13 (from the past 8736 hour(s))  SEDIMENTATION RATE   Collection Time    02/03/13  3:46 PM      Result Value Range   Sed Rate 22  0 - 22 mm/hr  ANA   Collection Time    02/03/13  3:46 PM      Result Value Range   ANA NEG  NEGATIVE  RHEUMATOID FACTOR   Collection  Time    02/03/13  3:46 PM      Result Value Range   Rheumatoid Factor <10  <=14 IU/mL  B. BURGDORFI ANTIBODIES   Collection Time    02/03/13  3:50 PM      Result Value Range   B burgdorferi Ab IgG+IgM 0.11    Results for orders placed during the hospital encounter of 10/11/12 (from the past 8736 hour(s))  URINALYSIS, ROUTINE W REFLEX MICROSCOPIC   Collection Time    10/11/12  3:42 AM      Result Value Range   Color, Urine YELLOW  YELLOW   APPearance CLEAR  CLEAR   Specific Gravity, Urine 1.025  1.005 - 1.030   pH 6.5  5.0 - 8.0   Glucose, UA NEGATIVE  NEGATIVE mg/dL   Hgb urine dipstick TRACE (*) NEGATIVE   Bilirubin Urine SMALL (*) NEGATIVE   Ketones, ur 15 (*) NEGATIVE mg/dL   Protein, ur NEGATIVE  NEGATIVE mg/dL   Urobilinogen, UA 0.2  0.0 - 1.0 mg/dL   Nitrite NEGATIVE  NEGATIVE   Leukocytes, UA NEGATIVE  NEGATIVE  PREGNANCY, URINE   Collection Time    10/11/12  3:42 AM      Result Value Range   Preg Test, Ur NEGATIVE  NEGATIVE  URINE MICROSCOPIC-ADD ON   Collection Time    10/11/12  3:42 AM      Result Value Range   Squamous Epithelial / LPF MANY (*) RARE   WBC, UA 0-2  <3 WBC/hpf   RBC /  HPF 0-2  <3 RBC/hpf   Bacteria, UA MANY (*) RARE  CBC WITH DIFFERENTIAL   Collection Time    10/11/12  4:11 AM      Result Value Range   WBC 8.1  4.0 - 10.5 K/uL   RBC 4.67  3.87 - 5.11 MIL/uL   Hemoglobin 13.6  12.0 - 15.0 g/dL   HCT 39.2  36.0 - 46.0 %   MCV 83.9  78.0 - 100.0 fL   MCH 29.1  26.0 - 34.0 pg   MCHC 34.7  30.0 - 36.0 g/dL   RDW 13.6  11.5 - 15.5 %   Platelets 299  150 - 400 K/uL   Neutrophils Relative % 82 (*) 43 - 77 %   Neutro Abs 6.7  1.7 - 7.7 K/uL   Lymphocytes Relative 12  12 - 46 %   Lymphs Abs 1.0  0.7 - 4.0 K/uL   Monocytes Relative 5  3 - 12 %   Monocytes Absolute 0.4  0.1 - 1.0 K/uL   Eosinophils Relative 0  0 - 5 %   Eosinophils Absolute 0.0  0.0 - 0.7 K/uL   Basophils Relative 0  0 - 1 %   Basophils Absolute 0.0  0.0 - 0.1 K/uL  BASIC METABOLIC PANEL   Collection Time    10/11/12  4:11 AM      Result Value Range   Sodium 141  135 - 145 mEq/L   Potassium 3.8  3.5 - 5.1 mEq/L   Chloride 107  96 - 112 mEq/L   CO2 24  19 - 32 mEq/L   Glucose, Bld 120 (*) 70 - 99 mg/dL   BUN 5 (*) 6 - 23 mg/dL   Creatinine, Ser 0.59  0.50 - 1.10 mg/dL   Calcium 9.1  8.4 - 10.5 mg/dL   GFR calc non Af Amer >90  >90 mL/min   GFR calc Af Amer >90  >90 mL/min  LIPASE, BLOOD   Collection Time    10/11/12  4:11 AM      Result Value Range   Lipase 26  11 - 59 U/L  Results for orders placed during the hospital encounter of 05/31/12 (from the past 8736 hour(s))  POCT HEMOGLOBIN-HEMACUE   Collection Time    05/31/12  8:30 AM      Result Value Range   Hemoglobin 12.1  12.0 - 15.0 g/dL  Family doctor draws these  Assessment Axis I bipolar 1 disorder, history of postpartum depression, history of ADHD Axis II deferred Axis III see medical history Axis IV moderate Axis V 60-65  Plan: I took her vitals.  I reviewed CC, tobacco/med/surg Hx, meds effects/ side effects, problem list, therapies and responses as well as current situation/symptoms discussed options. Due  to her depressive symptoms she'll increase Celexa to 60 mg every morning and continue her other medications. I will see her in 3 weeks but she can call if her symptoms worsen or she feel suicidal See orders and pt instructions for more details.  MEDICATIONS this encounter: Meds ordered this encounter  Medications  . citalopram (CELEXA) 40 MG tablet    Sig: Take one and one half tablets per day    Dispense:  45 tablet    Refill:  2  . lamoTRIgine (LAMICTAL) 200 MG tablet    Sig: Take 1 tablet (200 mg total) by mouth at bedtime.    Dispense:  30 tablet    Refill:  2  . amphetamine-dextroamphetamine (ADDERALL XR) 30 MG 24 hr capsule    Sig: Take 1 capsule (30 mg total) by mouth daily.    Dispense:  30 capsule    Refill:  0  . amphetamine-dextroamphetamine (ADDERALL) 10 MG tablet    Sig: Take one at 3 pm    Dispense:  30 tablet    Refill:  0    Medical Decision Making Problem Points:  Established problem, stable/improving (1), Review of last therapy session (1) and Review of psycho-social stressors (1) Data Points:  Review or order clinical lab tests (1) Review of medication regiment & side effects (2)  I certify that outpatient services furnished can reasonably be expected to improve the patient's condition.   Levonne Spiller, MD

## 2013-04-28 ENCOUNTER — Other Ambulatory Visit (HOSPITAL_COMMUNITY): Payer: Self-pay | Admitting: Psychiatry

## 2013-04-28 ENCOUNTER — Ambulatory Visit: Payer: Medicaid Other

## 2013-05-04 ENCOUNTER — Ambulatory Visit: Payer: Medicaid Other

## 2013-05-05 ENCOUNTER — Encounter: Payer: Self-pay | Admitting: Nurse Practitioner

## 2013-05-05 ENCOUNTER — Ambulatory Visit (INDEPENDENT_AMBULATORY_CARE_PROVIDER_SITE_OTHER): Payer: Medicaid Other | Admitting: Nurse Practitioner

## 2013-05-05 VITALS — BP 122/80 | Ht 65.0 in | Wt 213.8 lb

## 2013-05-05 DIAGNOSIS — IMO0001 Reserved for inherently not codable concepts without codable children: Secondary | ICD-10-CM

## 2013-05-05 DIAGNOSIS — G43909 Migraine, unspecified, not intractable, without status migrainosus: Secondary | ICD-10-CM

## 2013-05-05 DIAGNOSIS — Z309 Encounter for contraceptive management, unspecified: Secondary | ICD-10-CM

## 2013-05-05 MED ORDER — MEDROXYPROGESTERONE ACETATE 150 MG/ML IM SUSP
150.0000 mg | Freq: Once | INTRAMUSCULAR | Status: AC
  Administered 2013-05-05: 150 mg via INTRAMUSCULAR

## 2013-05-06 ENCOUNTER — Ambulatory Visit (HOSPITAL_COMMUNITY): Payer: Self-pay | Admitting: Psychiatry

## 2013-05-09 ENCOUNTER — Encounter: Payer: Self-pay | Admitting: Nurse Practitioner

## 2013-05-09 NOTE — Progress Notes (Signed)
Subjective:  Presents for routine followup of her migraine headaches. Has not had a migraine since her last visit. Nausea but no vomiting. Moderate headache at times, rare use of Tylenol. Has not used her Toradol. Still sees her psychiatrist. Recently increased her Celexa dose to 60 mg.  Objective:   BP 122/80  Ht 5\' 5"  (1.651 m)  Wt 213 lb 12.8 oz (96.979 kg)  BMI 35.58 kg/m2 NAD. Alert, oriented. Lungs clear. Heart regular rate rhythm.  Assessment: Problem List Items Addressed This Visit     Cardiovascular and Mediastinum   MIGRAINE HEADACHE - Primary    Other Visit Diagnoses   Birth control        Relevant Medications       medroxyPROGESTERone (DEPO-PROVERA) injection 150 mg (Completed)      Continue current medications as directed. Recheck in 3 months, call back sooner if any problems.

## 2013-05-16 ENCOUNTER — Ambulatory Visit (HOSPITAL_COMMUNITY): Payer: Self-pay | Admitting: Psychiatry

## 2013-05-16 ENCOUNTER — Telehealth (HOSPITAL_COMMUNITY): Payer: Self-pay | Admitting: *Deleted

## 2013-05-16 ENCOUNTER — Telehealth: Payer: Self-pay | Admitting: Family Medicine

## 2013-05-16 NOTE — Telephone Encounter (Signed)
Patient needs Rx for lorazepam and But/Apap/Caf Codeine Cap ACT to [facility], her new pharmacy.

## 2013-05-16 NOTE — Telephone Encounter (Signed)
ERROR, wrong patient

## 2013-05-18 ENCOUNTER — Other Ambulatory Visit: Payer: Self-pay | Admitting: Gastroenterology

## 2013-05-18 ENCOUNTER — Ambulatory Visit (INDEPENDENT_AMBULATORY_CARE_PROVIDER_SITE_OTHER): Payer: Medicaid Other | Admitting: Gastroenterology

## 2013-05-18 ENCOUNTER — Encounter: Payer: Self-pay | Admitting: Gastroenterology

## 2013-05-18 ENCOUNTER — Encounter (HOSPITAL_COMMUNITY): Payer: Self-pay | Admitting: Pharmacy Technician

## 2013-05-18 VITALS — BP 133/78 | HR 101 | Temp 97.6°F | Ht 63.0 in | Wt 213.2 lb

## 2013-05-18 DIAGNOSIS — K625 Hemorrhage of anus and rectum: Secondary | ICD-10-CM

## 2013-05-18 DIAGNOSIS — K648 Other hemorrhoids: Secondary | ICD-10-CM | POA: Insufficient documentation

## 2013-05-18 NOTE — Patient Instructions (Signed)
COLONOSCOPY POSSIBLE HEMORRHOID BANDING FRI FEB 20.  CALL ME IF PREPOPIK IS TOO EXPENSIVE.  FULL LIQUID DIET ON THUR.  FOLLOW UP IN 4-6 WEEKS.

## 2013-05-18 NOTE — Assessment & Plan Note (Signed)
Most likely due to anal fissure. ONLY TAKING RECTIV ONCE DAILY. IncompleteTCS in 2010 for rectal bleeding. Pt COULD NOT TOLERATE 4L PREP.  TCS FEB 20 W/ PREPOPIK. IF TOO EXPENSIVE, PT WILL NEED MIRALAX PREP. FULL LIQUID DIET THE DAY BEFORE PHENERGAN 25 MG IV ON PREOP LIDOCAINE JELLY FOR ECTAL EXAM IN ENDO OPV IN 4-6 WEEKS

## 2013-05-18 NOTE — Progress Notes (Signed)
Subjective:    Patient ID: Kristin Stephens, female    DOB: Nov 03, 1985, 28 y.o.   MRN: [id number]  Kristin Battiest, MD   HPI USING RECTIV AT LEAST ONCE A DAY. USES IT MORE ON THE WEEKENDS. HAVING PAIN AND BLEEDING. BLEEDING AFTER BMs OFF AND ON. NOT AFTER EVERY BM. WHEN SHE WIPES NORMALLY SEES BLOOD. PAIN MAY BE BEFORE BMA ND MAY BE AFTER A LARGE BM.  FEELS LIKE SHE HAS A TEAR. RECTIV MAKES THE STOOL HURT LESS WHEN IT COMES. SOME RECTAL ITCHING/PRESSURE WHEN SHE HAS URGENCY BUT NOT AL THE TIME. HAD 4L PREP LAST TIME AND COULDN'T TOLERATE. LAST N/V 2 WEEKS AGO WITH A MIGRAINE. REGLAN PRN. DEXILANT ELIMINATES HEART BURN. PT DENIES FEVER, CHILLS,  melena, diarrhea, abd pain, problems swallowing, problems with sedation, OR heartburn or indigestion.  Past Medical History  Diagnosis Date  . Esophageal reflux   . IBS (irritable bowel syndrome)   . Anxiety   . Depression   . Bipolar affective disorder   . TMJ syndrome   . Asthma     daily and prn inhalers  . Deviated nasal septum 05/2012  . Nasal turbinate hypertrophy 05/2012    bilateral  . Dental crowns present   . Eczema     right leg  . Migraine headache   . Hypertension   . ADHD (attention deficit hyperactivity disorder)   . Fibromyalgia    Past Surgical History  Procedure Laterality Date  . Asd and vsd repair  1989  . Coarctation of aorta repair  1989  . Cesarean section  04/12/2009  . Esophagogastroduodenoscopy  04/04/2008      Normal esophagus/Mild patchy erythema in the antrum/ Normal duodenal bulb, normal small bowel biopsy  . Flexible sigmoidoscopy  04/04/2008      Small internal hemorrhoids (poor bowel prep)  . Cardiac catheterization  06/18/2006  . Nasal septoplasty w/ turbinoplasty Bilateral 05/31/2012    Procedure: NASAL SEPTOPLASTY WITH TURBINATE REDUCTION;  Surgeon: Kristin Dike, MD;  Location: [facility];  Service: ENT;  Laterality: Bilateral;   Allergies  Allergen Reactions  . Sulfonamide Derivatives  Swelling    SWELLING OF EYES WITH OPHTHALMIC SULFA  . Coconut Flavor Rash   Current Outpatient Prescriptions  Medication Sig Dispense Refill  . acetaminophen (TYLENOL) 500 MG tablet Take 1,500 mg by mouth daily.      Marland Kitchen albuterol (PROVENTIL HFA;VENTOLIN HFA) 108 (90 BASE) MCG/ACT inhaler Inhale 1-2 puffs into the lungs every 6 (six) hours as needed for wheezing.    Marland Kitchen ALPRAZolam (XANAX) 0.5 MG tablet Take 1 tablet (0.5 mg total) by mouth at bedtime as needed for sleep.    Marland Kitchen amphetamine-dextroamphetamine (ADDERALL XR) 30 MG 24 hr capsule Take 1 capsule (30 mg total) by mouth daily.    Marland Kitchen amphetamine-dextroamphetamine (ADDERALL) 10 MG tablet Take one at 3 pm    . beclomethasone (QVAR) 80 MCG/ACT inhaler Inhale 2 puffs into the lungs 2 (two) times daily.    . citalopram (CELEXA) 40 MG tablet Take one and one half tablets per day    . clobetasol cream (TEMOVATE) 2.69 % Apply 1 application topically 2 (two) times daily as needed (eczema rash).    . cyclobenzaprine (FLEXERIL) 5 MG tablet Take 5 mg by mouth 3 (three) times daily as needed for muscle spasms.    . DEXILANT 60 MG capsule TAKE 1 CAPSULE BY MOUTH ONCE DAILY.    Marland Kitchen gabapentin (NEURONTIN) 300 MG capsule 2 po q am;  one in the afternoon; 2 po q pm    . hydroxypropyl methylcellulose (ISOPTO TEARS) 2.5 % ophthalmic solution Place 1 drop into both eyes as needed (dry eyes).    Marland Kitchen ketorolac (TORADOL) 10 MG tablet Take 1 tablet (10 mg total) by mouth every 6 (six) hours as needed. Do not use more than 5 days continuously; max dose 40 mg in 24 hrs    . lamoTRIgine (LAMICTAL) 200 MG tablet Take 1 tablet (200 mg total) by mouth at bedtime.    Marland Kitchen loratadine (CLARITIN) 10 MG tablet Take 1 tablet (10 mg total) by mouth daily as needed for rhinitis.    . medroxyPROGESTERone (DEPO-PROVERA) 150 MG/ML injection Inject 150 mg into the muscle every 3 (three) months.    . metoCLOPramide (REGLAN) 10 MG tablet Take 1 tablet (10 mg total) by mouth 3 (three) times daily  as needed for nausea (headache / nausea). AS NEEDED   . mometasone (NASONEX) 50 MCG/ACT nasal spray Place 2 sprays into the nose daily.    . naproxen (NAPROSYN) 500 MG tablet Take 1 tablet (500 mg total) by mouth 2 (two) times daily with a meal.    . Nitroglycerin 0.4 % OINT 1/2 PEA SIZE AMOUNT TO RECTUM BID TO TREAT RECTAL PAIN/BLEEDING      Review of Systems     Objective:   Physical Exam  Vitals reviewed. Constitutional: She is oriented to person, place, and time. She appears well-nourished. No distress.  HENT:  Head: Normocephalic and atraumatic.  Mouth/Throat: Oropharynx is clear and moist. No oropharyngeal exudate.  Eyes: Pupils are equal, round, and reactive to light. No scleral icterus.  Neck: Normal range of motion. Neck supple.  Cardiovascular: Normal rate, regular rhythm and normal heart sounds.   Pulmonary/Chest: Effort normal and breath sounds normal. No respiratory distress.  Abdominal: Soft. Bowel sounds are normal. She exhibits no distension. There is no tenderness.  Musculoskeletal: She exhibits no edema.  Lymphadenopathy:    She has no cervical adenopathy.  Neurological: She is alert and oriented to person, place, and time.  NO FOCAL DEFICITS   Psychiatric: She has a normal mood and affect.          Assessment & Plan:

## 2013-05-18 NOTE — Progress Notes (Signed)
cc'd to pcp 

## 2013-05-20 ENCOUNTER — Encounter (HOSPITAL_COMMUNITY)
Admission: RE | Disposition: A | Discharge status: Home / Self Care | Payer: Self-pay | Source: Ambulatory Visit | Attending: Gastroenterology

## 2013-05-20 ENCOUNTER — Ambulatory Visit (HOSPITAL_COMMUNITY)
Admission: RE | Admit: 2013-05-20 | Discharge: 2013-05-20 | Disposition: A | Discharge status: Home / Self Care | Payer: Medicaid Other | Source: Ambulatory Visit | Attending: Gastroenterology | Admitting: Gastroenterology

## 2013-05-20 ENCOUNTER — Encounter (HOSPITAL_COMMUNITY): Payer: Self-pay | Admitting: *Deleted

## 2013-05-20 DIAGNOSIS — K625 Hemorrhage of anus and rectum: Secondary | ICD-10-CM

## 2013-05-20 DIAGNOSIS — I10 Essential (primary) hypertension: Secondary | ICD-10-CM | POA: Insufficient documentation

## 2013-05-20 DIAGNOSIS — K648 Other hemorrhoids: Secondary | ICD-10-CM | POA: Insufficient documentation

## 2013-05-20 DIAGNOSIS — Q438 Other specified congenital malformations of intestine: Secondary | ICD-10-CM | POA: Insufficient documentation

## 2013-05-20 HISTORY — PX: HEMORRHOID BANDING: SHX5850

## 2013-05-20 HISTORY — PX: COLONOSCOPY: SHX5424

## 2013-05-20 SURGERY — COLONOSCOPY
Anesthesia: Moderate Sedation

## 2013-05-20 MED ORDER — SODIUM CHLORIDE 0.9 % IV SOLN
INTRAVENOUS | Status: DC
  Administered 2013-05-20: 12:00:00 via INTRAVENOUS

## 2013-05-20 MED ORDER — MIDAZOLAM HCL 5 MG/5ML IJ SOLN
INTRAMUSCULAR | Status: DC | PRN
  Administered 2013-05-20 (×4): 2 mg via INTRAVENOUS

## 2013-05-20 MED ORDER — PROMETHAZINE HCL 25 MG/ML IJ SOLN
INTRAMUSCULAR | Status: AC
  Filled 2013-05-20: qty 1

## 2013-05-20 MED ORDER — STERILE WATER FOR IRRIGATION IR SOLN
Status: DC | PRN
  Administered 2013-05-20: 13:00:00

## 2013-05-20 MED ORDER — LIDOCAINE VISCOUS 2 % MT SOLN
OROMUCOSAL | Status: AC
  Filled 2013-05-20: qty 15

## 2013-05-20 MED ORDER — MEPERIDINE HCL 100 MG/ML IJ SOLN
INTRAMUSCULAR | Status: DC | PRN
  Administered 2013-05-20: 25 mg via INTRAVENOUS
  Administered 2013-05-20: 50 mg via INTRAVENOUS
  Administered 2013-05-20: 25 mg via INTRAVENOUS
  Administered 2013-05-20: 50 mg via INTRAVENOUS

## 2013-05-20 MED ORDER — MIDAZOLAM HCL 5 MG/5ML IJ SOLN
INTRAMUSCULAR | Status: AC
  Filled 2013-05-20: qty 10

## 2013-05-20 MED ORDER — PROMETHAZINE HCL 25 MG/ML IJ SOLN
25.0000 mg | Freq: Once | INTRAMUSCULAR | Status: AC
  Administered 2013-05-20: 25 mg via INTRAVENOUS

## 2013-05-20 MED ORDER — MEPERIDINE HCL 100 MG/ML IJ SOLN
INTRAMUSCULAR | Status: AC
  Filled 2013-05-20: qty 2

## 2013-05-20 MED ORDER — SODIUM CHLORIDE 0.9 % IJ SOLN
INTRAMUSCULAR | Status: AC
  Filled 2013-05-20: qty 10

## 2013-05-20 NOTE — H&P (View-Only) (Signed)
Subjective:    Patient ID: Kristin Stephens, female    DOB: Nov 03, 1985, 28 y.o.   MRN: [id number]  Rubbie Battiest, MD   HPI USING RECTIV AT LEAST ONCE A DAY. USES IT MORE ON THE WEEKENDS. HAVING PAIN AND BLEEDING. BLEEDING AFTER BMs OFF AND ON. NOT AFTER EVERY BM. WHEN SHE WIPES NORMALLY SEES BLOOD. PAIN MAY BE BEFORE BMA ND MAY BE AFTER A LARGE BM.  FEELS LIKE SHE HAS A TEAR. RECTIV MAKES THE STOOL HURT LESS WHEN IT COMES. SOME RECTAL ITCHING/PRESSURE WHEN SHE HAS URGENCY BUT NOT AL THE TIME. HAD 4L PREP LAST TIME AND COULDN'T TOLERATE. LAST N/V 2 WEEKS AGO WITH A MIGRAINE. REGLAN PRN. DEXILANT ELIMINATES HEART BURN. PT DENIES FEVER, CHILLS,  melena, diarrhea, abd pain, problems swallowing, problems with sedation, OR heartburn or indigestion.  Past Medical History  Diagnosis Date  . Esophageal reflux   . IBS (irritable bowel syndrome)   . Anxiety   . Depression   . Bipolar affective disorder   . TMJ syndrome   . Asthma     daily and prn inhalers  . Deviated nasal septum 05/2012  . Nasal turbinate hypertrophy 05/2012    bilateral  . Dental crowns present   . Eczema     right leg  . Migraine headache   . Hypertension   . ADHD (attention deficit hyperactivity disorder)   . Fibromyalgia    Past Surgical History  Procedure Laterality Date  . Asd and vsd repair  1989  . Coarctation of aorta repair  1989  . Cesarean section  04/12/2009  . Esophagogastroduodenoscopy  04/04/2008      Normal esophagus/Mild patchy erythema in the antrum/ Normal duodenal bulb, normal small bowel biopsy  . Flexible sigmoidoscopy  04/04/2008      Small internal hemorrhoids (poor bowel prep)  . Cardiac catheterization  06/18/2006  . Nasal septoplasty w/ turbinoplasty Bilateral 05/31/2012    Procedure: NASAL SEPTOPLASTY WITH TURBINATE REDUCTION;  Surgeon: Ascencion Dike, MD;  Location: [facility];  Service: ENT;  Laterality: Bilateral;   Allergies  Allergen Reactions  . Sulfonamide Derivatives  Swelling    SWELLING OF EYES WITH OPHTHALMIC SULFA  . Coconut Flavor Rash   Current Outpatient Prescriptions  Medication Sig Dispense Refill  . acetaminophen (TYLENOL) 500 MG tablet Take 1,500 mg by mouth daily.      Marland Kitchen albuterol (PROVENTIL HFA;VENTOLIN HFA) 108 (90 BASE) MCG/ACT inhaler Inhale 1-2 puffs into the lungs every 6 (six) hours as needed for wheezing.    Marland Kitchen ALPRAZolam (XANAX) 0.5 MG tablet Take 1 tablet (0.5 mg total) by mouth at bedtime as needed for sleep.    Marland Kitchen amphetamine-dextroamphetamine (ADDERALL XR) 30 MG 24 hr capsule Take 1 capsule (30 mg total) by mouth daily.    Marland Kitchen amphetamine-dextroamphetamine (ADDERALL) 10 MG tablet Take one at 3 pm    . beclomethasone (QVAR) 80 MCG/ACT inhaler Inhale 2 puffs into the lungs 2 (two) times daily.    . citalopram (CELEXA) 40 MG tablet Take one and one half tablets per day    . clobetasol cream (TEMOVATE) 2.69 % Apply 1 application topically 2 (two) times daily as needed (eczema rash).    . cyclobenzaprine (FLEXERIL) 5 MG tablet Take 5 mg by mouth 3 (three) times daily as needed for muscle spasms.    . DEXILANT 60 MG capsule TAKE 1 CAPSULE BY MOUTH ONCE DAILY.    Marland Kitchen gabapentin (NEURONTIN) 300 MG capsule 2 po q am;  one in the afternoon; 2 po q pm    . hydroxypropyl methylcellulose (ISOPTO TEARS) 2.5 % ophthalmic solution Place 1 drop into both eyes as needed (dry eyes).    . ketorolac (TORADOL) 10 MG tablet Take 1 tablet (10 mg total) by mouth every 6 (six) hours as needed. Do not use more than 5 days continuously; max dose 40 mg in 24 hrs    . lamoTRIgine (LAMICTAL) 200 MG tablet Take 1 tablet (200 mg total) by mouth at bedtime.    . loratadine (CLARITIN) 10 MG tablet Take 1 tablet (10 mg total) by mouth daily as needed for rhinitis.    . medroxyPROGESTERone (DEPO-PROVERA) 150 MG/ML injection Inject 150 mg into the muscle every 3 (three) months.    . metoCLOPramide (REGLAN) 10 MG tablet Take 1 tablet (10 mg total) by mouth 3 (three) times daily  as needed for nausea (headache / nausea). AS NEEDED   . mometasone (NASONEX) 50 MCG/ACT nasal spray Place 2 sprays into the nose daily.    . naproxen (NAPROSYN) 500 MG tablet Take 1 tablet (500 mg total) by mouth 2 (two) times daily with a meal.    . Nitroglycerin 0.4 % OINT 1/2 PEA SIZE AMOUNT TO RECTUM BID TO TREAT RECTAL PAIN/BLEEDING      Review of Systems     Objective:   Physical Exam  Vitals reviewed. Constitutional: She is oriented to person, place, and time. She appears well-nourished. No distress.  HENT:  Head: Normocephalic and atraumatic.  Mouth/Throat: Oropharynx is clear and moist. No oropharyngeal exudate.  Eyes: Pupils are equal, round, and reactive to light. No scleral icterus.  Neck: Normal range of motion. Neck supple.  Cardiovascular: Normal rate, regular rhythm and normal heart sounds.   Pulmonary/Chest: Effort normal and breath sounds normal. No respiratory distress.  Abdominal: Soft. Bowel sounds are normal. She exhibits no distension. There is no tenderness.  Musculoskeletal: She exhibits no edema.  Lymphadenopathy:    She has no cervical adenopathy.  Neurological: She is alert and oriented to person, place, and time.  NO FOCAL DEFICITS   Psychiatric: She has a normal mood and affect.          Assessment & Plan:   

## 2013-05-20 NOTE — Op Note (Signed)
[facility] [address] [place], [address]   COLONOSCOPY PROCEDURE REPORT  PATIENT: Kristin Stephens, Kristin Stephens.  MR#: [id number] BIRTHDATE: 04-Jun-1985 , 27  yrs. old GENDER: Female ENDOSCOPIST: Barney Drain, MD REFERRED AG:TXMIWOE Wolfgang Phoenix, M.D. PROCEDURE DATE:  05/20/2013 PROCEDURE:   Colonoscopy, diagnostic and Hemorrhoidectomy via banding, clips or ligation INDICATIONS:Rectal Bleeding. MEDICATIONS: PREOP: Promethazine (Phenergan) 25mg  IV, Demerol 150 mg IV, and Versed 8 mg IV  DESCRIPTION OF PROCEDURE:    Physical exam was performed.  Informed consent was obtained from the patient after explaining the benefits, risks, and alternatives to procedure.  The patient was connected to monitor and placed in left lateral position. Continuous oxygen was provided by nasal cannula and IV medicine administered through an indwelling cannula.  After administration of sedation and rectal exam, the patients rectum was intubated and the EC-3890Li ([id number]) and EG-[id number] ([id number])  colonoscope was advanced under direct visualization to the ileum.  The scope was removed slowly by carefully examining the color, texture, anatomy, and integrity mucosa on the way out.  The patient was recovered in endoscopy and discharged home in satisfactory condition.    COLON FINDINGS: The mucosa appeared normal in the terminal ileum.  , The colon IS redundant.  Manual abdominal counter-pressure was used to reach the cecum, The colon mucosa was otherwise normal. Moderate sized internal hemorrhoids were found.    3 BANDS APPLIED.  PREP QUALITY: good.CECAL W/D TIME: 14 minutes     COMPLICATIONS: None  ENDOSCOPIC IMPRESSION: 1.   Normal mucosa in the terminal ileum 2.   The colon IS redundant 3.   RECTAL BLEEDING DUE TO Moderate sized internal hemorrhoids  RECOMMENDATIONS: CALL [phone] FOR FEVER, A LARGE AMOUNT OF BLEEDING, OR DIFFICULTY URINATING.  DRINK WATER TO KEEP URINE LIGHT YELLOW. MAY  USE NAPROXEN OR IBUPROFEN TWICE DAILY FOR RECTAL DISCOMFORT. TYLENOL AS NEED FOR ADDITIONAL PAIN RELIEF. IF NEEDED, USE COLACE TWICE DAILY TO SOFTEN STOOL. FOLLOW A LOW RESIDUE DIET FOR THE NEXT 2-3 WEEKS. FOLLOW UP IN MAR 5 AT 1130.       _______________________________ Lorrin MaisBarney Drain, MD 05/20/2013 2:44 PM     PATIENT NAME:  Kristin Stephens. MR#: [id number]

## 2013-05-20 NOTE — Interval H&P Note (Signed)
History and Physical Interval Note:  05/20/2013 12:16 PM  Kristin Stephens  has presented today for surgery, with the diagnosis of RECTAL BLEEDING  The various methods of treatment have been discussed with the patient and family. After consideration of risks, benefits and other options for treatment, the patient has consented to  Procedure(s) with comments: COLONOSCOPY (N/A) - 115-moved to [place] notified pt HEMORRHOID BANDING (N/A) as a surgical intervention .  The patient's history has been reviewed, patient examined, no change in status, stable for surgery.  I have reviewed the patient's chart and labs.  Questions were answered to the patient's satisfaction.     [employer]

## 2013-05-20 NOTE — Discharge Instructions (Signed)
You have internal hemorrhoids. YOU DID NOT HAVE ANY POLYPS. I PLACED 3 blue BANDS TO TREAT YOUR HEMORRHOIDAL BLEEDING. YOU MAY SOME MILD BLEEDING OVER THE NEXT 3 TO 5 DAYS.   CALL [phone] IF YOU HAVE A FEVER, A LARGE AMOUNT OF BLEEDING, OR DIFFICULTY URINATING.  DRINK WATER TO KEEP URINE LIGHT YELLOW. YOU MAY USE NAPROXEN OR IBUPROFEN TWICE DAILY FOR RECTAL DISCOMFORT. TYLENOL AS NEED FOR ADDITIONAL PAIN RELIEF. IF NEEDED, USE COLACE TWICE DAILY TO SOFTEN STOOL.  FOLLOW A LOW RESIDUE DIET FOR THE NEXT 2-3 WEEKS. SEE INFO BELOW. FOLLOW UP IN 2-3 WEEKS.      Colonoscopy Care After Read the instructions outlined below and refer to this sheet in the next week. These discharge instructions provide you with general information on caring for yourself after you leave the hospital. While your treatment has been planned according to the most current medical practices available, unavoidable complications occasionally occur. If you have any problems or questions after discharge, call DR. FIELDS, [phone].  ACTIVITY  You may resume your regular activity, but move at a slower pace for the next 24 hours.   Take frequent rest periods for the next 24 hours.   Walking will help get rid of the air and reduce the bloated feeling in your belly (abdomen).   No driving for 24 hours (because of the medicine (anesthesia) used during the test).   You may shower.   Do not sign any important legal documents or operate any machinery for 24 hours (because of the anesthesia used during the test).    NUTRITION  Drink plenty of fluids.   You may resume your normal diet as instructed by your doctor.   Begin with a light meal and progress to your normal diet. Heavy or fried foods are harder to digest and may make you feel sick to your stomach (nauseated).   Avoid alcoholic beverages for 24 hours or as instructed.    MEDICATIONS  You may resume your normal medications.   WHAT YOU CAN EXPECT  TODAY  Some feelings of bloating in the abdomen.   Passage of more gas than usual.   Spotting of blood in your stool or on the toilet paper  .  IF YOU HAD POLYPS REMOVED DURING THE COLONOSCOPY:  Eat a soft diet IF YOU HAVE NAUSEA, BLOATING, ABDOMINAL PAIN, OR VOMITING.    FINDING OUT THE RESULTS OF YOUR TEST Not all test results are available during your visit. DR. Oneida Alar WILL CALL YOU WITHIN 7 DAYS OF YOUR PROCEDUE WITH YOUR RESULTS. Do not assume everything is normal if you have not heard from DR. FIELDS IN ONE WEEK, CALL HER OFFICE AT [phone].  SEEK IMMEDIATE MEDICAL ATTENTION AND CALL THE OFFICE: [phone] IF:  You have more than a spotting of blood in your stool.   Your belly is swollen (abdominal distention).   You are nauseated or vomiting.   You have a temperature over 101F.   You have abdominal pain or discomfort that is severe or gets worse throughout the day.   Hemorrhoids Hemorrhoids are dilated (enlarged) veins around the rectum. Sometimes clots will form in the veins. This makes them swollen and painful. These are called thrombosed hemorrhoids. Causes of hemorrhoids include:  Constipation.   Straining to have a bowel movement.   HEAVY LIFTING HOME CARE INSTRUCTIONS  Eat a well balanced diet and drink 6 to 8 glasses of water every day to avoid constipation. You may also use a bulk laxative.  Avoid straining to have bowel movements.   Keep anal area dry and clean.   Do not use a donut shaped pillow or sit on the toilet for long periods. This increases blood pooling and pain.   Move your bowels when your body has the urge; this will require less straining and will decrease pain and pressure.  LOW RESIDUE DIET  A low-residue diet, aka low-fiber diet, is usually recommended. An intake of less than 10 grams of fiber per day is generally considered a low-residue diet.  LOW RESIDUE Diet  Grain Products: enriched refined white bread, buns,  bagels, English muffins  plain cereals e.g. Cheerios, Cornflakes, Cream of Wheat, Rice Krispies, Special K  arrowroot cookies, tea biscuits, soda crackers, plain melba toast  white rice, refined pasta and noodles  avoid whole grains   Fruits: fruit juices except prune juice  applesauce, apricots, banana (1/2), cantaloupe, canned fruit cocktail, grapes, honeydew melon, peaches, watermelon  avoid raw and dried fruits, and berries.   Vegetables: vegetable juices  potatoes (no Skin)  alfalfa sprouts, beets, green/yellow beans, carrots, celery, cucumber, eggplant, lettuce, mushrooms, green/red peppers, potatoes (peeled), squash, zucchini  avoid vegetables from the cruciferous family such as broccoli, cauliflower, brussels sprouts, cabbage, kale, Swiss chard, onion, etc   Meat and Protein Choice: well-cooked, tender meat, fish and eggs  avoid beans  avoid all nuts and seeds, as well as foods that may contain seeds (such as yogurt)   Dairy: NO REsTRICTIONS   Drinks: juices  tea and coffee   avoid alcohol

## 2013-05-23 ENCOUNTER — Encounter (HOSPITAL_COMMUNITY): Payer: Self-pay | Admitting: Gastroenterology

## 2013-05-25 NOTE — Progress Notes (Signed)
Reminder in epic °

## 2013-05-30 ENCOUNTER — Telehealth (HOSPITAL_COMMUNITY): Payer: Self-pay | Admitting: *Deleted

## 2013-05-30 ENCOUNTER — Other Ambulatory Visit (HOSPITAL_COMMUNITY): Payer: Self-pay | Admitting: Psychiatry

## 2013-05-30 MED ORDER — AMPHETAMINE-DEXTROAMPHETAMINE 10 MG PO TABS: ORAL_TABLET | ORAL | Status: DC

## 2013-05-30 MED ORDER — AMPHETAMINE-DEXTROAMPHET ER 30 MG PO CP24: 30.0000 mg | ORAL_CAPSULE | Freq: Every day | ORAL | Status: DC

## 2013-05-30 NOTE — Telephone Encounter (Signed)
Given but needs to come in

## 2013-06-01 ENCOUNTER — Ambulatory Visit (INDEPENDENT_AMBULATORY_CARE_PROVIDER_SITE_OTHER): Payer: 59 | Admitting: Psychiatry

## 2013-06-01 ENCOUNTER — Encounter (HOSPITAL_COMMUNITY): Payer: Self-pay | Admitting: Psychiatry

## 2013-06-01 VITALS — BP 130/88 | Ht 63.0 in | Wt 209.0 lb

## 2013-06-01 DIAGNOSIS — Z8659 Personal history of other mental and behavioral disorders: Secondary | ICD-10-CM

## 2013-06-01 DIAGNOSIS — F988 Other specified behavioral and emotional disorders with onset usually occurring in childhood and adolescence: Secondary | ICD-10-CM

## 2013-06-01 DIAGNOSIS — F319 Bipolar disorder, unspecified: Secondary | ICD-10-CM

## 2013-06-01 MED ORDER — LAMOTRIGINE 200 MG PO TABS: 200.0000 mg | ORAL_TABLET | Freq: Every day | ORAL | Status: DC

## 2013-06-01 MED ORDER — CITALOPRAM HYDROBROMIDE 40 MG PO TABS: ORAL_TABLET | ORAL | Status: DC

## 2013-06-01 MED ORDER — AMPHETAMINE-DEXTROAMPHETAMINE 10 MG PO TABS: ORAL_TABLET | ORAL | Status: DC

## 2013-06-01 MED ORDER — AMPHETAMINE-DEXTROAMPHET ER 30 MG PO CP24: 30.0000 mg | ORAL_CAPSULE | Freq: Every day | ORAL | Status: DC

## 2013-06-01 NOTE — Progress Notes (Signed)
Patient ID: Kristin Stephens, female   DOB: 03/18/1986, 28 y.o.   MRN: [id number] Patient ID: Kristin Stephens, female   DOB: 27-Jan-1986, 10 y.o.   MRN: [id number] Patient ID: Kristin Stephens, female   DOB: [birth year], 21 y.o.   MRN: [id number] Patient ID: Kristin Stephens, female   DOB: Apr 26, 1985, 78 y.o.   MRN: [id number] Patient ID: Kristin Stephens, female   DOB: 1985/11/14, 42 y.o.   MRN: [id number] [facility] [id number] Progress Note Kristin Stephens MRN: [id number] DOB: Apr 29, 1985 Age: 28 y.o.  Date: 06/01/2013 Start Time: 9:02 AM End Time: 9:22 AM  Chief Complaint: Chief Complaint  Patient presents with  . Anxiety  . ADD  . Depression  . Follow-up   Subjective: I've been doing better "  This patient is a 28 year old single white female who lives with her mother and her 83 and a [age]-year-old daughter. She still dating the father of her baby. She is a Charity fundraiser at [employer].  The patient states that she initially got depressed around age 70. She saw Peggy for counseling for a long time. Her teen years she double her depression by using alcohol marijuana and prescription pills she was also getting into self-mutilation. She was never psychiatrically hospitalized  The patient did well while she was pregnant with her daughter at age 17. However shortly after giving birth she got extremely depressed, she was unable to care for herself or the baby and had absolutely no interest in anything. She also began developing manic symptoms at times when she was very hyperactive "high", and didn't sleep or eat and was hyperalert. The physician had diagnosed her as being bipolar and she was placed on Lamictal.  The patient also has a history of ADHD that has been present since childhood. Adderall has been effective for this. The patient returns after 6 weeks. Last time she was more depressed and we increased her Celexa. She steadily  feeling better and her mood is improved. She's focusing well and doing well in school. She recently had some rectal hemorrhoids banded but other than that her health has been stable.  The patient denies any suicidal ideation but doesn't like the feelings of recurrent depression. She has been keeping her grades but is isolating more and has thought about cutting herself but promises she would never act on it. We discussed going up on her Celexa and she is agreeable to Current psychiatric medication Lamictal 200 mg daily Celexa 40 mg daily Adderall xr 30 mg every morning and Adderall 10 mg after school Xanax  0.5 mg each bedtime  Past psychiatric history Patient has been seeing in this office since April 2012 by Dr Adele Schilder however she has been seeing therapist in this office for more than 10 years.  Patient endorse history of mood swing, depression, anger and poor attention and concentration.  Patient also endorse history of self abusive behavior and endorse cutting herself with a razor blade when her grandmother was dying.  In the past she was prescribed Wellbutrin but she was admitted on medical for the she never took the Wellbutrin.  Patient denies any history of suicidal attempt or any inpatient psychiatric treatment.  She had history of burst of energy with excessive shopping, cleaning, road rage and impulse eating.   Psychosocial history Patient was raised by her mother and grandmother when her father left when she was only 83 years old.  Patient told her father was  never in her life and she was very close to her grandmother.  Patient has a lot of resentment and anger towards her father.  Patient grandmother died when she was only 66 years old.  Patient has one daughter.  Patient has a very good relationship with her boyfriend whose been very supportive.  Family history family history includes ADD / ADHD in her brother, brother, and sister; Alcohol abuse in her father; Anxiety disorder in her  brother; Bipolar disorder in her father and paternal grandmother; Dementia in her maternal grandmother and paternal grandmother; Depression in her mother; Emphysema in her paternal grandmother; Heart disease in her maternal grandmother and paternal grandmother; Hyperlipidemia in her mother; Hypertension in her mother; Personality disorder in her father; Physical abuse in her mother; Prostate cancer in her maternal grandfather; Seizures in her daughter; Sexual abuse in her mother; Stroke in her maternal grandfather. There is no history of Drug abuse, OCD, Paranoid behavior, or Schizophrenia.  Alcohol and substance use history Patient endorse history of using marijuana and drugs and alcohol in her teens when she was self-medicating for her depression and anxiety.  However she began drinking heavily again in the last several months, particularly on weekends. She quit 90 days ago and is attending AA.  Patient denies any history of DWI, tremors or blackout.  Education and work history Patient has GED and currently she is enrolled in [employer]  She is hoping to finish her schooling in 2015.  Patient has a history of attention and concentration problem which was formally tested by psychologist in this office.    Medical history Patient has history of headache, GERD, allergic rhinitis, hypertension, irritable bowel syndrome and aortic Valve repaired.    Mental status examination Patient is well groomed and casually dressed. She is calm and cooperative.  She described her mood is fairly upbeat and her affect is bright  She denies any active or passive suicidal thoughts or homicidal thoughts. She denies thoughts of self-harm Her speech is  clear and coherent.  Her thought process is logical linear and goal-directed.  There no psychotic symptoms present at this time.  Her attention and concentration is good  She's oriented x3.  Her insight judgment and impulse control is okay.  Lab Results:  Results for orders placed  in visit on 03/02/13 (from the past 8736 hour(s))  HEPATIC FUNCTION PANEL   Collection Time    03/02/13  2:35 PM      Result Value Ref Range   Total Bilirubin 0.5  0.3 - 1.2 mg/dL   Bilirubin, Direct 0.1  0.0 - 0.3 mg/dL   Indirect Bilirubin 0.4  0.0 - 0.9 mg/dL   Alkaline Phosphatase 85  39 - 117 U/L   AST 16  0 - 37 U/L   ALT 14  0 - 35 U/L   Total Protein 7.2  6.0 - 8.3 g/dL   Albumin 4.5  3.5 - 5.2 g/dL  Results for orders placed in visit on 02/03/13 (from the past 8736 hour(s))  SEDIMENTATION RATE   Collection Time    02/03/13  3:46 PM      Result Value Ref Range   Sed Rate 22  0 - 22 mm/hr  ANA   Collection Time    02/03/13  3:46 PM      Result Value Ref Range   ANA NEG  NEGATIVE  RHEUMATOID FACTOR   Collection Time    02/03/13  3:46 PM      Result Value Ref  Range   Rheumatoid Factor <10  <=14 IU/mL  B. BURGDORFI ANTIBODIES   Collection Time    02/03/13  3:50 PM      Result Value Ref Range   B burgdorferi Ab IgG+IgM 0.11    Results for orders placed during the hospital encounter of 10/11/12 (from the past 8736 hour(s))  URINALYSIS, ROUTINE W REFLEX MICROSCOPIC   Collection Time    10/11/12  3:42 AM      Result Value Ref Range   Color, Urine YELLOW  YELLOW   APPearance CLEAR  CLEAR   Specific Gravity, Urine 1.025  1.005 - 1.030   pH 6.5  5.0 - 8.0   Glucose, UA NEGATIVE  NEGATIVE mg/dL   Hgb urine dipstick TRACE (*) NEGATIVE   Bilirubin Urine SMALL (*) NEGATIVE   Ketones, ur 15 (*) NEGATIVE mg/dL   Protein, ur NEGATIVE  NEGATIVE mg/dL   Urobilinogen, UA 0.2  0.0 - 1.0 mg/dL   Nitrite NEGATIVE  NEGATIVE   Leukocytes, UA NEGATIVE  NEGATIVE  PREGNANCY, URINE   Collection Time    10/11/12  3:42 AM      Result Value Ref Range   Preg Test, Ur NEGATIVE  NEGATIVE  URINE MICROSCOPIC-ADD ON   Collection Time    10/11/12  3:42 AM      Result Value Ref Range   Squamous Epithelial / LPF MANY (*) RARE   WBC, UA 0-2  <3 WBC/hpf   RBC / HPF 0-2  <3 RBC/hpf    Bacteria, UA MANY (*) RARE  CBC WITH DIFFERENTIAL   Collection Time    10/11/12  4:11 AM      Result Value Ref Range   WBC 8.1  4.0 - 10.5 K/uL   RBC 4.67  3.87 - 5.11 MIL/uL   Hemoglobin 13.6  12.0 - 15.0 g/dL   HCT 39.2  36.0 - 46.0 %   MCV 83.9  78.0 - 100.0 fL   MCH 29.1  26.0 - 34.0 pg   MCHC 34.7  30.0 - 36.0 g/dL   RDW 13.6  11.5 - 15.5 %   Platelets 299  150 - 400 K/uL   Neutrophils Relative % 82 (*) 43 - 77 %   Neutro Abs 6.7  1.7 - 7.7 K/uL   Lymphocytes Relative 12  12 - 46 %   Lymphs Abs 1.0  0.7 - 4.0 K/uL   Monocytes Relative 5  3 - 12 %   Monocytes Absolute 0.4  0.1 - 1.0 K/uL   Eosinophils Relative 0  0 - 5 %   Eosinophils Absolute 0.0  0.0 - 0.7 K/uL   Basophils Relative 0  0 - 1 %   Basophils Absolute 0.0  0.0 - 0.1 K/uL  BASIC METABOLIC PANEL   Collection Time    10/11/12  4:11 AM      Result Value Ref Range   Sodium 141  135 - 145 mEq/L   Potassium 3.8  3.5 - 5.1 mEq/L   Chloride 107  96 - 112 mEq/L   CO2 24  19 - 32 mEq/L   Glucose, Bld 120 (*) 70 - 99 mg/dL   BUN 5 (*) 6 - 23 mg/dL   Creatinine, Ser 0.59  0.50 - 1.10 mg/dL   Calcium 9.1  8.4 - 10.5 mg/dL   GFR calc non Af Amer >90  >90 mL/min   GFR calc Af Amer >90  >90 mL/min  LIPASE, BLOOD   Collection Time  10/11/12  4:11 AM      Result Value Ref Range   Lipase 26  11 - 59 U/L  Family doctor draws these  Assessment Axis I bipolar 1 disorder, history of postpartum depression, history of ADHD Axis II deferred Axis III see medical history Axis IV moderate Axis V 60-65  Plan: I took her vitals.  I reviewed CC, tobacco/med/surg Hx, meds effects/ side effects, problem list, therapies and responses as well as current situation/symptoms discussed options. She will continue her current effective medications and return in 3 months.  See orders and pt instructions for more details.  MEDICATIONS this encounter: Meds ordered this encounter  Medications  . lamoTRIgine (LAMICTAL) 200 MG tablet     Sig: Take 1 tablet (200 mg total) by mouth at bedtime.    Dispense:  30 tablet    Refill:  2  . citalopram (CELEXA) 40 MG tablet    Sig: Take one and one half tablets per day    Dispense:  45 tablet    Refill:  2  . amphetamine-dextroamphetamine (ADDERALL XR) 30 MG 24 hr capsule    Sig: Take 1 capsule (30 mg total) by mouth daily.    Dispense:  30 capsule    Refill:  0    Do not fill before 06/30/13  . amphetamine-dextroamphetamine (ADDERALL XR) 30 MG 24 hr capsule    Sig: Take 1 capsule (30 mg total) by mouth daily.    Dispense:  30 capsule    Refill:  0    Do not fill before 07/30/13  . amphetamine-dextroamphetamine (ADDERALL) 10 MG tablet    Sig: Take one at 3 pm    Dispense:  30 tablet    Refill:  0    Do not fill before 06/30/13  . amphetamine-dextroamphetamine (ADDERALL) 10 MG tablet    Sig: Take one daily at 3 pm    Dispense:  30 tablet    Refill:  0    Do not fill before 07/30/13    Medical Decision Making Problem Points:  Established problem, stable/improving (1), Review of last therapy session (1) and Review of psycho-social stressors (1) Data Points:  Review or order clinical lab tests (1) Review of medication regiment & side effects (2)  I certify that outpatient services furnished can reasonably be expected to improve the patient's condition.   Levonne Spiller, MD

## 2013-06-02 ENCOUNTER — Ambulatory Visit (HOSPITAL_COMMUNITY): Payer: Self-pay | Admitting: Psychiatry

## 2013-06-03 ENCOUNTER — Ambulatory Visit (INDEPENDENT_AMBULATORY_CARE_PROVIDER_SITE_OTHER): Payer: 59 | Admitting: Psychiatry

## 2013-06-03 DIAGNOSIS — F319 Bipolar disorder, unspecified: Secondary | ICD-10-CM

## 2013-06-03 NOTE — Patient Instructions (Signed)
Discussed orallly 

## 2013-06-03 NOTE — Progress Notes (Signed)
Patient:  Kristin Stephens   DOB: 1986/02/21  MR Number: [id number]  Location: [place]:  [address] [place],  [place], [place]  Start: Friday 06/03/2013 9:05 PM End: Friday 06/03/2013 9:55 PM  Provider/Observer:     Maurice Small, MSW, LCSW   Chief Complaint:      Chief Complaint  Patient presents with  . Anxiety  . Depression    Reason For Service:     The patient is a returning patient to this practice and reports increased symptoms of anxiety and depression. She has a history of bipolar disorder. Patient is seen today for follow up appointment.  Interventions Strategy:  Supportive therapy,cognitive behavioral therapy  Participation Level:   Active  Participation Quality:  Appropriate    Behavioral Observation:  Casual, Alert,  Talkative, teasrful  Current Psychosocial Factors: Patient's daughter continues to have health issues. Patient's uncle has cancer. She recently learned boyfriend's aunt has poor prognosis as she was diagnosed with cancer for the third time.  Content of Session:   Reviewing symptoms, processing feelings, identifying thought patterns and ways to intervene, encouraging patient to resume self-care efforts, reviewing relaxation tecniques  Current Status:   The patient reports increased depressed mood, anxiety, and anger.  Patient Progress:    The patient reports increased stress due to multiple stressors and states feeling more emotional. She reports taking it out on people sometimes yelling at  her mother She has 10 months sobriety and fears resuming alcohol use if her uncle  and her boyfriend's aunt die.  This leads to spiraling thoughts of being a bad mother to her child and regrets over the past. Therapist works with patient to process feelings, identify her patterns of ruminating over the past and catastrophizing about the future. Therapist and patient discuss ways to intervene in spiraling negative thoughts including challenging thoughts,  identifying coping statements, identifying supports and resources, and reviewing relaxation techniques.    Target Goals:   1. Increase understanding of mental disorder and improve self-care ; 1: 1 psychoeducation and cognitive behavior therapy, one time every 1-4 weeks     2. Improve communication skills (decrease yelling) and ability to identify and verbalize feelings; 1-1 psychotherapy ( supportive and cognitive therapy) one time every 1-4 weeks    3. Decrease anxiety and improve relaxation/coping techniques; 1-1 psychotherapy (supportive and cognitive behavioral therapy) one time every 1-4 weeks    4. Increase positive thought patterns and decreased negative thought patterns; 1:1 psychotherapy (supportive and cognitive therapy) one time every 1-4 weeks  Last Reviewed:   01/19/2012  Goals Addressed Today:    1,3,4  Impression/Diagnosis:   The patient has a long-standing history of symptoms of anxiety and depression along with mood swings beginning in early adolescence. Symptoms have worsened since the birth of her daughter in January 2011. Current symptoms now include anxiety, depressed mood, ruminating thoughts, irritability, mood swings, and crying spells. Diagnosis: bipolar 1 disorder  Diagnosis:  Axis I:  Bipolar disorder          Axis II: Deferred

## 2013-06-06 ENCOUNTER — Ambulatory Visit (INDEPENDENT_AMBULATORY_CARE_PROVIDER_SITE_OTHER): Payer: Medicaid Other | Admitting: Nurse Practitioner

## 2013-06-06 ENCOUNTER — Encounter: Payer: Self-pay | Admitting: Nurse Practitioner

## 2013-06-06 VITALS — BP 134/78 | Temp 99.1°F | Ht 65.0 in | Wt 213.0 lb

## 2013-06-06 DIAGNOSIS — J069 Acute upper respiratory infection, unspecified: Secondary | ICD-10-CM

## 2013-06-06 DIAGNOSIS — B349 Viral infection, unspecified: Secondary | ICD-10-CM

## 2013-06-06 DIAGNOSIS — B9789 Other viral agents as the cause of diseases classified elsewhere: Secondary | ICD-10-CM

## 2013-06-06 NOTE — Progress Notes (Signed)
Subjective:  Presents for c/o cough, head congestion, runny nose, sore throat and ear pain that started yest. No documented fever. Generalized headache and myalgias. No vomiting diarrhea or abd pain. Went to college today.   Objective:   BP 134/78  Temp(Src) 99.1 F (37.3 C) (Oral)  Ht 5\' 5"  (1.651 m)  Wt 213 lb (96.616 kg)  BMI 35.44 kg/m2 NAD. Alert, oriented. TMs mild clear effusion, no erythema. Pharynx clear. Neck supple with mild soft anterior adenopathy. Lungs clear. Heart RRR.   Assessment:Acute upper respiratory infections of unspecified site  Viral illness  Plan:Reviewed symptomatic care and warning signs. Call back by end of week if no better, sooner if worse.

## 2013-06-14 ENCOUNTER — Ambulatory Visit: Payer: Self-pay | Admitting: Pulmonary Disease

## 2013-06-17 ENCOUNTER — Ambulatory Visit (INDEPENDENT_AMBULATORY_CARE_PROVIDER_SITE_OTHER): Payer: 59 | Admitting: Psychiatry

## 2013-06-17 DIAGNOSIS — F319 Bipolar disorder, unspecified: Secondary | ICD-10-CM
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
## 2013-06-17 NOTE — Patient Instructions (Signed)
Discussed orally 

## 2013-06-22 ENCOUNTER — Telehealth (HOSPITAL_COMMUNITY): Payer: Self-pay | Admitting: *Deleted

## 2013-06-22 ENCOUNTER — Other Ambulatory Visit (HOSPITAL_COMMUNITY): Payer: Self-pay | Admitting: Psychiatry

## 2013-06-22 MED ORDER — AMPHETAMINE-DEXTROAMPHETAMINE 10 MG PO TABS: ORAL_TABLET | ORAL | Status: DC

## 2013-06-22 NOTE — Telephone Encounter (Signed)
done

## 2013-06-24 ENCOUNTER — Ambulatory Visit (INDEPENDENT_AMBULATORY_CARE_PROVIDER_SITE_OTHER): Payer: Medicaid Other | Admitting: Family Medicine

## 2013-06-24 ENCOUNTER — Encounter: Payer: Self-pay | Admitting: Family Medicine

## 2013-06-24 VITALS — BP 120/76 | Temp 98.5°F | Ht 65.0 in | Wt 212.8 lb

## 2013-06-24 DIAGNOSIS — K529 Noninfective gastroenteritis and colitis, unspecified: Secondary | ICD-10-CM

## 2013-06-24 DIAGNOSIS — K5289 Other specified noninfective gastroenteritis and colitis: Secondary | ICD-10-CM

## 2013-06-24 MED ORDER — ONDANSETRON HCL 8 MG PO TABS: 8.0000 mg | ORAL_TABLET | Freq: Three times a day (TID) | ORAL | Status: DC | PRN

## 2013-06-24 NOTE — Progress Notes (Signed)
   Subjective:    Patient ID: Kristin Stephens, female    DOB: [birth year], 28 y.o.   MRN: [id number]  Emesis  This is a new problem. The current episode started in the past 7 days. Pertinent negatives include no abdominal pain, chest pain, coughing, diarrhea or fever.  started 10 pm Weds Nausea first, emesis No diarrhea Vomiting on first day now nausea and dry heaves. Had chills, no fever PMH benign  Review of Systems  Constitutional: Negative for fever and fatigue.  HENT: Negative for congestion.   Respiratory: Negative for cough.   Cardiovascular: Negative for chest pain.  Gastrointestinal: Positive for nausea and vomiting. Negative for abdominal pain and diarrhea.       Objective:   Physical Exam  Vitals reviewed. Constitutional: She appears well-nourished. No distress.  Cardiovascular: Normal rate, regular rhythm and normal heart sounds.   No murmur heard. Pulmonary/Chest: Effort normal and breath sounds normal. No respiratory distress.  Abdominal: Soft. She exhibits no distension. There is no tenderness.  Musculoskeletal: She exhibits no edema.  Lymphadenopathy:    She has no cervical adenopathy.  Neurological: She is alert. She exhibits normal muscle tone.  Psychiatric: Her behavior is normal.          Assessment & Plan:  Gastroenteritis viral process Zofran for the nausea followup if high fevers chills sweats nausea or other problems persist patient not dehydrated.

## 2013-06-27 ENCOUNTER — Telehealth: Payer: Self-pay | Admitting: Family Medicine

## 2013-06-27 ENCOUNTER — Encounter (HOSPITAL_COMMUNITY): Payer: Self-pay | Admitting: Emergency Medicine

## 2013-06-27 ENCOUNTER — Emergency Department (HOSPITAL_COMMUNITY)
Admission: EM | Admit: 2013-06-27 | Discharge: 2013-06-27 | Disposition: A | Discharge status: Home / Self Care | Payer: Medicaid Other | Attending: Emergency Medicine | Admitting: Emergency Medicine

## 2013-06-27 DIAGNOSIS — K589 Irritable bowel syndrome without diarrhea: Secondary | ICD-10-CM | POA: Insufficient documentation

## 2013-06-27 DIAGNOSIS — F319 Bipolar disorder, unspecified: Secondary | ICD-10-CM | POA: Insufficient documentation

## 2013-06-27 DIAGNOSIS — Z98811 Dental restoration status: Secondary | ICD-10-CM | POA: Insufficient documentation

## 2013-06-27 DIAGNOSIS — I10 Essential (primary) hypertension: Secondary | ICD-10-CM | POA: Insufficient documentation

## 2013-06-27 DIAGNOSIS — Z9889 Other specified postprocedural states: Secondary | ICD-10-CM | POA: Insufficient documentation

## 2013-06-27 DIAGNOSIS — F17200 Nicotine dependence, unspecified, uncomplicated: Secondary | ICD-10-CM | POA: Insufficient documentation

## 2013-06-27 DIAGNOSIS — L600 Ingrowing nail: Secondary | ICD-10-CM

## 2013-06-27 DIAGNOSIS — J45909 Unspecified asthma, uncomplicated: Secondary | ICD-10-CM | POA: Insufficient documentation

## 2013-06-27 DIAGNOSIS — G43909 Migraine, unspecified, not intractable, without status migrainosus: Secondary | ICD-10-CM | POA: Insufficient documentation

## 2013-06-27 DIAGNOSIS — F411 Generalized anxiety disorder: Secondary | ICD-10-CM | POA: Insufficient documentation

## 2013-06-27 DIAGNOSIS — K219 Gastro-esophageal reflux disease without esophagitis: Secondary | ICD-10-CM | POA: Insufficient documentation

## 2013-06-27 DIAGNOSIS — IMO0001 Reserved for inherently not codable concepts without codable children: Secondary | ICD-10-CM | POA: Insufficient documentation

## 2013-06-27 DIAGNOSIS — Z79899 Other long term (current) drug therapy: Secondary | ICD-10-CM | POA: Insufficient documentation

## 2013-06-27 DIAGNOSIS — IMO0002 Reserved for concepts with insufficient information to code with codable children: Secondary | ICD-10-CM | POA: Insufficient documentation

## 2013-06-27 DIAGNOSIS — F909 Attention-deficit hyperactivity disorder, unspecified type: Secondary | ICD-10-CM | POA: Insufficient documentation

## 2013-06-27 MED ORDER — CEPHALEXIN 500 MG PO CAPS: 500.0000 mg | ORAL_CAPSULE | Freq: Four times a day (QID) | ORAL | Status: DC

## 2013-06-27 MED ORDER — CHLORHEXIDINE GLUCONATE 4 % EX LIQD: Freq: Every day | CUTANEOUS | Status: DC | PRN

## 2013-06-27 MED ORDER — LIDOCAINE HCL (PF) 1 % IJ SOLN
INTRAMUSCULAR | Status: AC
  Administered 2013-06-27: 18:00:00
  Filled 2013-06-27: qty 5

## 2013-06-27 NOTE — ED Provider Notes (Signed)
CSN: [id number]     Arrival date & time 06/27/13  1546 History   First MD Initiated Contact with Patient 06/27/13 1644    This chart was scribed for non-physician practitioner, Montine Circle, [place] working with Richarda Blade, MD by Terressa Koyanagi, ED Scribe. This patient was seen in room APFT21/APFT21 and the patient's care was started at 4:52 PM.  Chief Complaint  Patient presents with  . Abscess   HPI HPI Comments: Kristin Stephens is a 28 y.o. female, with an extensive history including HTN and Fibromyalgia, who presents to the Emergency Department with a chief complaint of right big toe pain onset yesterday. Pt reports she has an ingrown toenail on her right, great toe and she attempted to remove it herself last night, leading to her chief complaint. Pt has taken over the counter pain meds to alleviate the pain without relief.  Pt is a current smoker who has been smoking .5 ppd for the past 10 years. Pt is a former smokeless tobacco user with a quit date of 05/01/2012.  Past Medical History  Diagnosis Date  . Esophageal reflux   . IBS (irritable bowel syndrome)   . Anxiety   . Depression   . Bipolar affective disorder   . TMJ syndrome   . Asthma     daily and prn inhalers  . Deviated nasal septum 05/2012  . Nasal turbinate hypertrophy 05/2012    bilateral  . Dental crowns present   . Eczema     right leg  . Migraine headache   . Hypertension   . ADHD (attention deficit hyperactivity disorder)   . Fibromyalgia    Past Surgical History  Procedure Laterality Date  . Asd and vsd repair  1989  . Coarctation of aorta repair  1989  . Cesarean section  04/12/2009  . Esophagogastroduodenoscopy  04/04/2008      Normal esophagus/Mild patchy erythema in the antrum/ Normal duodenal bulb, normal small bowel biopsy  . Flexible sigmoidoscopy  04/04/2008      Small internal hemorrhoids (poor bowel prep)  . Cardiac catheterization  06/18/2006  . Nasal septoplasty w/ turbinoplasty Bilateral  05/31/2012    Procedure: NASAL SEPTOPLASTY WITH TURBINATE REDUCTION;  Surgeon: Ascencion Dike, MD;  Location: [facility];  Service: ENT;  Laterality: Bilateral;  . Colonoscopy N/A 05/20/2013    Procedure: COLONOSCOPY;  Surgeon: Danie Binder, MD;  Location[facility];  Service: Endoscopy;  Laterality: N/A;  115-moved to [place] notified pt  . Hemorrhoid banding N/A 05/20/2013    Procedure: HEMORRHOID BANDING;  Surgeon: Danie Binder, MD;  Location: [facility];  Service: Endoscopy;  Laterality: N/A;   Family History  Problem Relation Age of Onset  . Hyperlipidemia Mother   . Hypertension Mother   . Depression Mother   . Physical abuse Mother   . Sexual abuse Mother     possibly  . Emphysema Paternal Grandmother   . Heart disease Paternal Grandmother   . Bipolar disorder Paternal Grandmother   . Dementia Paternal Grandmother   . Heart disease Maternal Grandmother   . Dementia Maternal Grandmother   . Stroke Maternal Grandfather   . Prostate cancer Maternal Grandfather   . Personality disorder Father   . Bipolar disorder Father   . Alcohol abuse Father   . ADD / ADHD Sister   . Anxiety disorder Brother   . ADD / ADHD Brother   . ADD / ADHD Brother   .  Seizures Daughter   . Drug abuse Neg Hx   . OCD Neg Hx   . Paranoid behavior Neg Hx   . Schizophrenia Neg Hx    History  Substance Use Topics  . Smoking status: Current Some Day Smoker -- 0.50 packs/day for 10 years    Types: Cigarettes    Last Attempt to Quit: 04/01/2011  . Smokeless tobacco: Former Systems developer    Quit date: 05/01/2012     Comment: [place]   . 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  Respiratory: Negative for shortness of breath.   Cardiovascular: Negative for chest pain.   Gastrointestinal: Negative for nausea, vomiting, diarrhea and constipation.  Genitourinary: Negative for dysuria.  Musculoskeletal:       Pain and swelling in right big toe.   Skin: Positive for wound.      Allergies  Sulfonamide derivatives and Coconut flavor  Home Medications   Current Outpatient Rx  Name  Route  Sig  Dispense  Refill  . acetaminophen (TYLENOL) 500 MG tablet   Oral   Take 500 mg by mouth every 6 (six) hours as needed for moderate pain or headache.          . albuterol (PROVENTIL HFA;VENTOLIN HFA) 108 (90 BASE) MCG/ACT inhaler   Inhalation   Inhale 1-2 puffs into the lungs every 6 (six) hours as needed for wheezing.   1 Inhaler   3   . ALPRAZolam (XANAX) 0.5 MG tablet   Oral   Take 1 tablet (0.5 mg total) by mouth at bedtime as needed for sleep.   30 tablet   2   . amphetamine-dextroamphetamine (ADDERALL XR) 30 MG 24 hr capsule   Oral   Take 1 capsule (30 mg total) by mouth daily.   30 capsule   0     Do not fill before 06/30/13   . amphetamine-dextroamphetamine (ADDERALL XR) 30 MG 24 hr capsule   Oral   Take 1 capsule (30 mg total) by mouth daily.   30 capsule   0     Do not fill before 07/30/13   . amphetamine-dextroamphetamine (ADDERALL) 10 MG tablet      Take one at 3 pm   30 tablet   0     Do not fill before 06/30/13   . amphetamine-dextroamphetamine (ADDERALL) 10 MG tablet      Take one daily at 3 pm   30 tablet   0   . beclomethasone (QVAR) 80 MCG/ACT inhaler   Inhalation   Inhale 2 puffs into the lungs 2 (two) times daily.   1 Inhaler   3   . citalopram (CELEXA) 40 MG tablet      Take one and one half tablets per day   45 tablet   2   . clobetasol cream (TEMOVATE) 0.05 %   Topical   Apply 1 application topically 2 (two) times daily as needed (eczema rash).         . cyclobenzaprine (FLEXERIL) 5 MG tablet   Oral   Take 5-10 mg by mouth every 6 (six) hours as needed for muscle spasms.          . cycloSPORINE  (  RESTASIS) 0.05 % ophthalmic emulsion   Both Eyes   Place 1 drop into both eyes 2 (two) times daily.         Marland Kitchen DEXILANT 60 MG capsule      TAKE 1 CAPSULE BY MOUTH ONCE DAILY.   31 capsule   5   . fluticasone (FLONASE) 50 MCG/ACT nasal spray   Each Nare   Place 1 spray into both nostrils daily.         Marland Kitchen gabapentin (NEURONTIN) 300 MG capsule      2 po q am; one in the afternoon; 2 po q pm   150 capsule   2   . hydroxypropyl methylcellulose (ISOPTO TEARS) 2.5 % ophthalmic solution   Both Eyes   Place 1 drop into both eyes as needed (dry eyes).         Marland Kitchen ketorolac (TORADOL) 10 MG tablet   Oral   Take 1 tablet (10 mg total) by mouth every 6 (six) hours as needed. Do not use more than 5 days continuously; max dose 40 mg in 24 hrs   20 tablet   0   . lamoTRIgine (LAMICTAL) 200 MG tablet   Oral   Take 1 tablet (200 mg total) by mouth at bedtime.   30 tablet   2   . loratadine (CLARITIN) 10 MG tablet   Oral   Take 1 tablet (10 mg total) by mouth daily as needed for rhinitis.   30 tablet   0   . medroxyPROGESTERone (DEPO-PROVERA) 150 MG/ML injection   Intramuscular   Inject 150 mg into the muscle every 3 (three) months.         . metoCLOPramide (REGLAN) 10 MG tablet   Oral   Take 1 tablet (10 mg total) by mouth 3 (three) times daily as needed for nausea (headache / nausea).   20 tablet   0   . Nitroglycerin 0.4 % OINT      1/2 PEA SIZE AMOUNT TO RECTUM BID TO TREAT RECTAL PAIN/BLEEDING   30 g   0   . ondansetron (ZOFRAN) 8 MG tablet   Oral   Take 1 tablet (8 mg total) by mouth every 8 (eight) hours as needed for nausea.   20 tablet   0    Triage Vitals: BP 131/73  Pulse 107  Temp(Src) 99.1 F (37.3 C) (Oral)  Resp 18  Ht 5\' 5"  (1.651 m)  Wt 208 lb (94.348 kg)  BMI 34.61 kg/m2  SpO2 98% Physical Exam  Nursing note and vitals reviewed. Constitutional: She is oriented to person, place, and time. She appears well-developed and well-nourished. No  distress.  HENT:  Head: Normocephalic and atraumatic.  Eyes: EOM are normal.  Neck: Neck supple. No tracheal deviation present.  Cardiovascular: Normal rate.   Pulmonary/Chest: Effort normal. No respiratory distress.  Musculoskeletal: Normal range of motion.  Right great toe remarkable for moderate swelling and pain to palpation about the medial aspect of the toenail. Mild purulent discharge is noted.   Neurological: She is alert and oriented to person, place, and time.  Skin: Skin is warm and dry.  Psychiatric: She has a normal mood and affect. Her behavior is normal.    ED Course  NAIL REMOVAL Date/Time: 06/27/2013 5:35 PM Performed by: Montine Circle Authorized by: Montine Circle Consent: Verbal consent obtained. Risks and benefits: risks, benefits and alternatives were discussed Consent given by: patient Patient understanding: patient states understanding of the procedure being performed Patient consent:  the patient's understanding of the procedure matches consent given Procedure consent: procedure consent matches procedure scheduled Relevant documents: relevant documents present and verified Test results: test results available and properly labeled Site marked: the operative site was marked Imaging studies: imaging studies available Required items: required blood products, implants, devices, and special equipment available Patient identity confirmed: verbally with patient Time out: Immediately prior to procedure a "time out" was called to verify the correct patient, procedure, equipment, support staff and site/side marked as required. Location: right foot Location details: right big toe Anesthesia: digital block Local anesthetic: lidocaine 1% without epinephrine Anesthetic total: 5 ml Patient sedated: no Preparation: skin prepped with Betadine Amount removed: 1/5 Side: radial Wedge excision of skin of nail fold: yes Nail bed sutured: no Nail matrix removed:  none Removed nail replaced and anchored: no Dressing: 4x4 and antibiotic ointment Patient tolerance: Patient tolerated the procedure well with no immediate complications.   (including critical care time) DIAGNOSTIC STUDIES:   COORDINATION OF CARE:   Labs Review Labs Reviewed - No data to display Imaging Review No results found.   EKG Interpretation None      MDM   Final diagnoses:  Ingrown toenail    Patient with ingrown toenail. No evidence of infection. Lateral aspect of the nail was removed without complication. She given for hygiene instructions. Recommended Hibiclens soaks for the next week. PCP followup. Patient understands and agrees to plan. She is stable and ready for discharge.  I personally performed the services described in this documentation, which was scribed in my presence. The recorded information has been reviewed and is accurate.     Montine Circle, PA-C 06/27/13 1740

## 2013-06-27 NOTE — Discharge Instructions (Signed)

## 2013-06-27 NOTE — ED Notes (Signed)
Rt great toe infection around nail. Pt says she tried to open area at home and became worse.  Now soaking in saline and betadine.

## 2013-06-27 NOTE — Addendum Note (Signed)
Addended by: Dairl Ponder on: 06/27/2013 12:08 PM   Modules accepted: Orders

## 2013-06-27 NOTE — Telephone Encounter (Signed)
Pt called, wants referral to [facility] - Dr. Petery for an infected ingrown toenail (we've not seen pt for this). Explained that we have to send message to have doc ok referral and to initiate in system, she stated that she just didn't want to have to go to the ER to get it looked at or drained and to make sure this was done, again explained the protocol for requesting referrals. If it's "ok", please initiate in system so that I may process. Pt asks to please do ASAP, as they have an appointment open for her this afternoon but needs referral first, explained to pt we would do out best. Please advise  ° °

## 2013-06-27 NOTE — Telephone Encounter (Signed)
May initiate

## 2013-06-27 NOTE — Telephone Encounter (Signed)
Referral initiated in Epic.  

## 2013-06-27 NOTE — ED Notes (Signed)
Pt reports had ingrown toenail on r great toe.  Reports has become infected.

## 2013-06-27 NOTE — Telephone Encounter (Signed)
Pt called, wants referral to [facility] - Dr. Barkley Bruns for an infected ingrown toenail (we've not seen pt for this). Explained that we have to send message to have doc ok referral and to initiate in system, she stated that she just didn't want to have to go to the ER to get it looked at or drained and to make sure this was done, again explained the protocol for requesting referrals. If it's "ok", please initiate in system so that I may process. Pt asks to please do ASAP, as they have an appointment open for her this afternoon but needs referral first, explained to pt we would do out best. Please advise

## 2013-06-28 ENCOUNTER — Encounter: Payer: Self-pay | Admitting: Family Medicine

## 2013-06-28 NOTE — ED Provider Notes (Signed)
Medical screening examination/treatment/procedure(s) were performed by non-physician practitioner and as supervising physician I was immediately available for consultation/collaboration.  Richarda Blade, MD 06/28/13 [phone]

## 2013-07-04 ENCOUNTER — Encounter: Payer: Self-pay | Admitting: Pulmonary Disease

## 2013-07-04 ENCOUNTER — Ambulatory Visit (INDEPENDENT_AMBULATORY_CARE_PROVIDER_SITE_OTHER): Payer: Medicaid Other | Admitting: Pulmonary Disease

## 2013-07-04 VITALS — BP 108/68 | HR 92 | Temp 98.2°F | Ht 65.0 in | Wt 212.0 lb

## 2013-07-04 DIAGNOSIS — J45909 Unspecified asthma, uncomplicated: Secondary | ICD-10-CM

## 2013-07-04 MED ORDER — BECLOMETHASONE DIPROPIONATE 80 MCG/ACT IN AERS: 2.0000 | INHALATION_SPRAY | Freq: Two times a day (BID) | RESPIRATORY_TRACT | Status: DC

## 2013-07-04 NOTE — Addendum Note (Signed)
Addended by: Maurice March on: 07/04/2013 03:13 PM   Modules accepted: Orders

## 2013-07-04 NOTE — Patient Instructions (Signed)
Continue on qvar everyday Work on smoking cessation followup with me again in one year if doing well.

## 2013-07-04 NOTE — Assessment & Plan Note (Signed)
The patient feels that she is doing very well on Qvar, and has not had an acute exacerbation since the last visit. She rarely uses her rescue inhaler, and feels that she is doing well. I have stressed to her the importance of total smoking cessation, and she is working on this. I've asked her to continue with her current meds, and will see her back in one year.

## 2013-07-04 NOTE — Progress Notes (Signed)
   Subjective:    Patient ID: FRANKLIN CLAPSADDLE, female    DOB: 05/20/1985, 28 y.o.   MRN: [id number]  HPI The patient comes in today for followup of her known asthma. She has been staying on Qvar compliantly, and has not had an acute exacerbation. She rarely uses her rescue inhaler. She feels that her breathing has been well-controlled. Unfortunately, she restarted smoking again, but is trying to stop using a vapor cigarette.   Review of Systems  Constitutional: Negative for fever and unexpected weight change.  HENT: Negative for congestion, dental problem, ear pain, nosebleeds, postnasal drip, rhinorrhea, sinus pressure, sneezing, sore throat and trouble swallowing.   Eyes: Negative for redness and itching.  Respiratory: Negative for cough, chest tightness, shortness of breath and wheezing.   Cardiovascular: Positive for palpitations. Negative for leg swelling.  Gastrointestinal: Negative for nausea and vomiting.  Genitourinary: Negative for dysuria.  Musculoskeletal: Negative for joint swelling.  Skin: Negative for rash.  Neurological: Positive for headaches.  Hematological: Does not bruise/bleed easily.  Psychiatric/Behavioral: Positive for dysphoric mood. The patient is nervous/anxious.        Objective:   Physical Exam Ow female in nad Nose without purulence or d/c noted. Neck without LN or TMG Chest with totally clear bs, no wheezing Cor with rrr LE without edema, no cyanosis Alert and oriented, moves all 4.        Assessment & Plan:

## 2013-07-15 ENCOUNTER — Ambulatory Visit (INDEPENDENT_AMBULATORY_CARE_PROVIDER_SITE_OTHER): Payer: 59 | Admitting: Psychiatry

## 2013-07-15 DIAGNOSIS — F319 Bipolar disorder, unspecified: Secondary | ICD-10-CM

## 2013-07-15 NOTE — Patient Instructions (Signed)
Discussed orally 

## 2013-07-15 NOTE — Progress Notes (Signed)
   THERAPIST PROGRESS NOTE  Session Time:  Friday 07/15/2013 1:05 PM -1:55 PM  Participation Level: Active  Behavioral Response: CasualAlertAnxious/Tearful  Type of Therapy: Individual Therapy  Treatment Goals addressed:  Improve communication skills (decrease yelling) and ability to identify and verbalize feelings       Decrease anxiety and improve relaxation/coping techniques       Increase positive thought patterns and decreased negative thought patterns   Interventions: CBT, Assertiveness Training and Supportive  Summary: Kristin Stephens is a 28 y.o. female who presents with a long-standing history of symptoms of anxiety and depression along with mood swings beginning in early adolescence. Symptoms worsened after the birth of her daughter in January 2011 as patient experienced severe depression. She continues to experience recurrent periods of depression and periods of irritability and anger.  She is tearful in session today as she shares information regarding relationship with mother. Patient reports mother has begun to make different requests from patient since mother recently started seeing a therapist. Mother also gives patient a note to give to this clinician about issues she wants patient to work on in therapy. Patient expresses frustration and anger but also wants to be supportive of mother as well as understanding mother's point of view. Patient reports she has been using feelings log and has improved emotional awareness as well as emotional regulation.   Suicidal/Homicidal: No  Therapist Response: Therapist works with patient to process feelings, reinforce her efforts to set/maintain boundaries in relationship with mother, identify ways to improve communication in relationship with mother, discuss being assertive versus being aggressive, Therapist provides patient with handouts on assertiveness skills  Plan: Return again in 6 weeks.  Diagnosis: Axis I: Bipolar  Disorder    Axis II: Deferred    Aivy Akter, LCSW 07/15/2013

## 2013-07-26 ENCOUNTER — Telehealth (HOSPITAL_COMMUNITY): Payer: Self-pay | Admitting: *Deleted

## 2013-07-26 NOTE — Telephone Encounter (Signed)
done

## 2013-07-27 ENCOUNTER — Ambulatory Visit (INDEPENDENT_AMBULATORY_CARE_PROVIDER_SITE_OTHER): Payer: Medicaid Other | Admitting: *Deleted

## 2013-07-27 DIAGNOSIS — IMO0001 Reserved for inherently not codable concepts without codable children: Secondary | ICD-10-CM

## 2013-07-27 DIAGNOSIS — Z309 Encounter for contraceptive management, unspecified: Secondary | ICD-10-CM

## 2013-07-27 MED ORDER — MEDROXYPROGESTERONE ACETATE 150 MG/ML IM SUSP
150.0000 mg | Freq: Once | INTRAMUSCULAR | Status: AC
  Administered 2013-07-27: 150 mg via INTRAMUSCULAR

## 2013-08-03 ENCOUNTER — Other Ambulatory Visit: Payer: Self-pay | Admitting: Gastroenterology

## 2013-08-12 IMAGING — CR DG CHEST 2V
2 series · 2 of 2 positions shown · non-contrast
Comparison: November 10, 2011.

CLINICAL DATA: Shortness of breath

CHEST - 2 VIEW

[w chest pa]
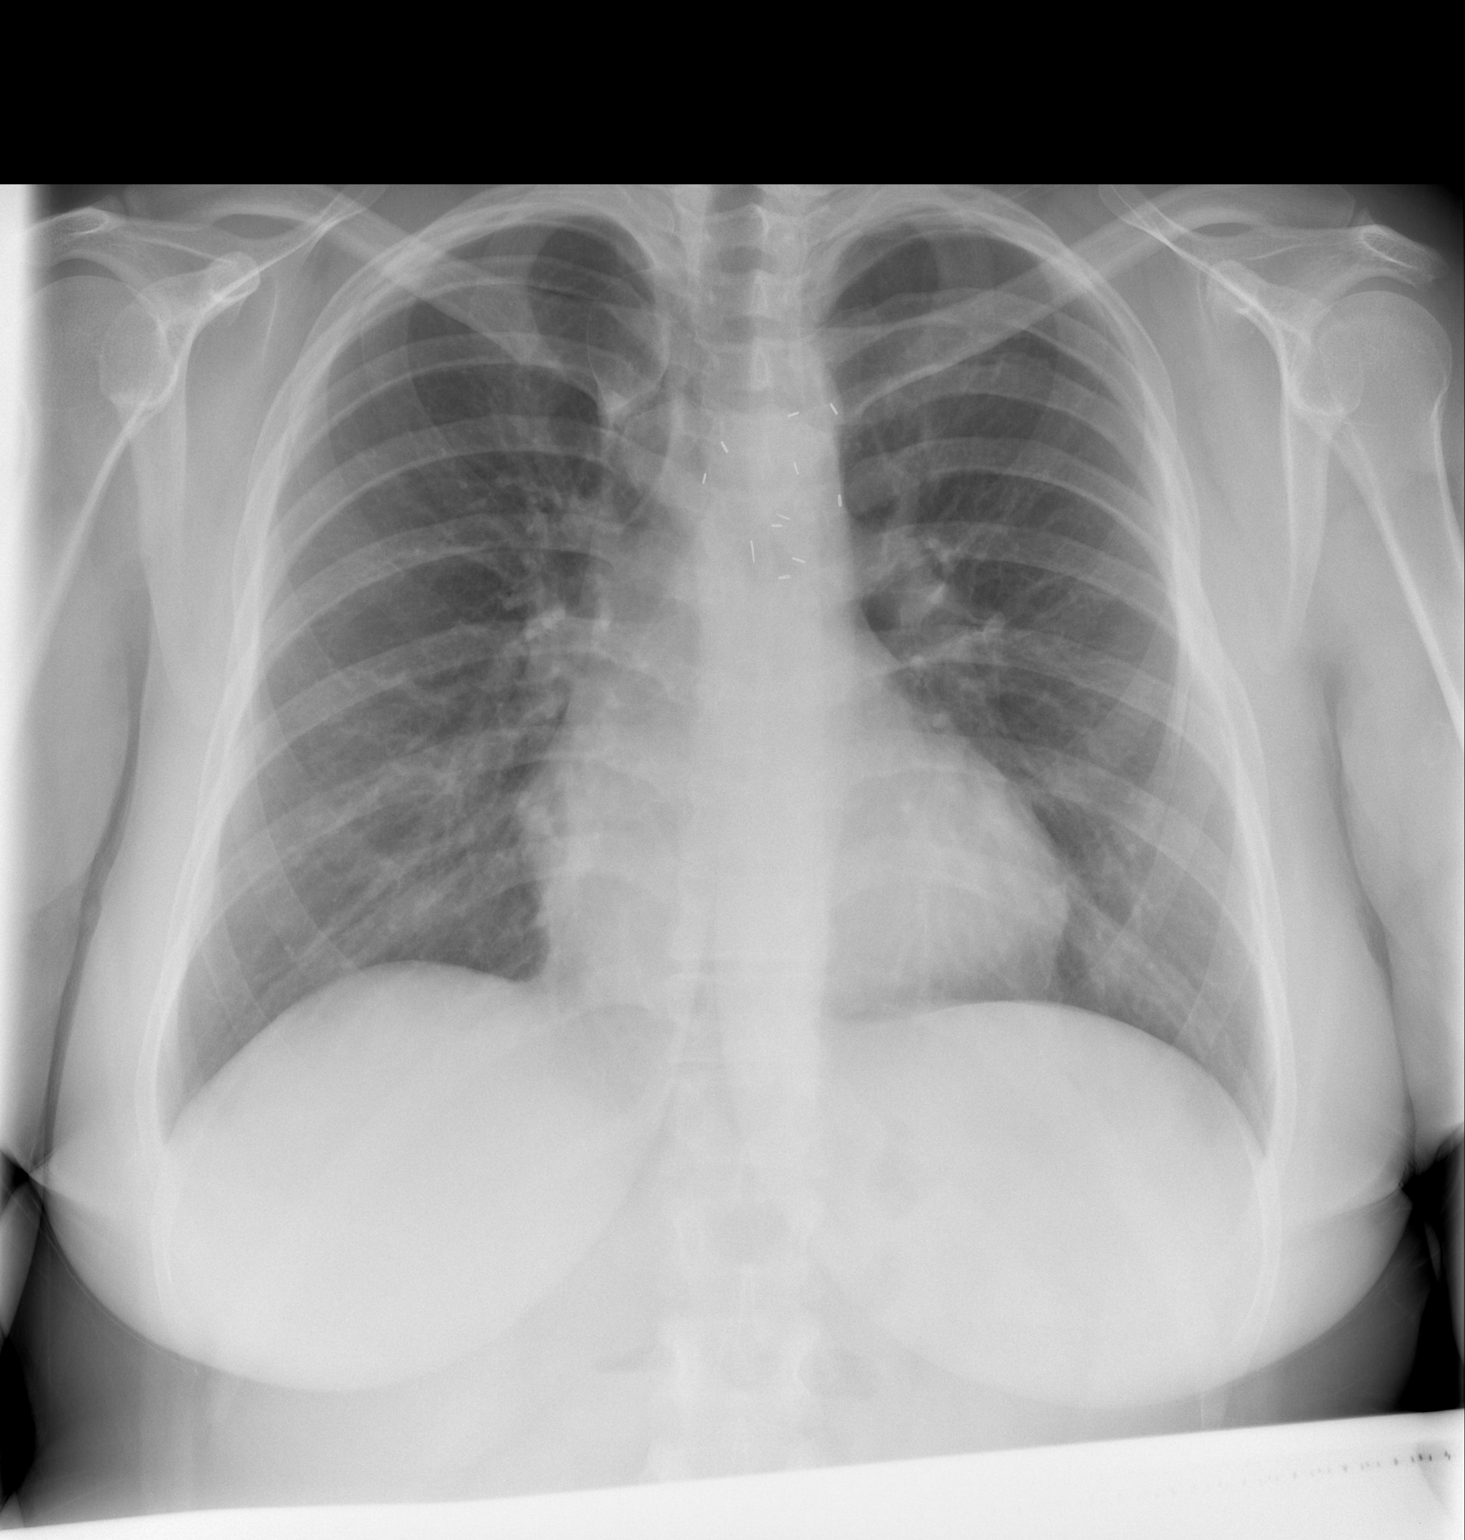

[w chest lat]
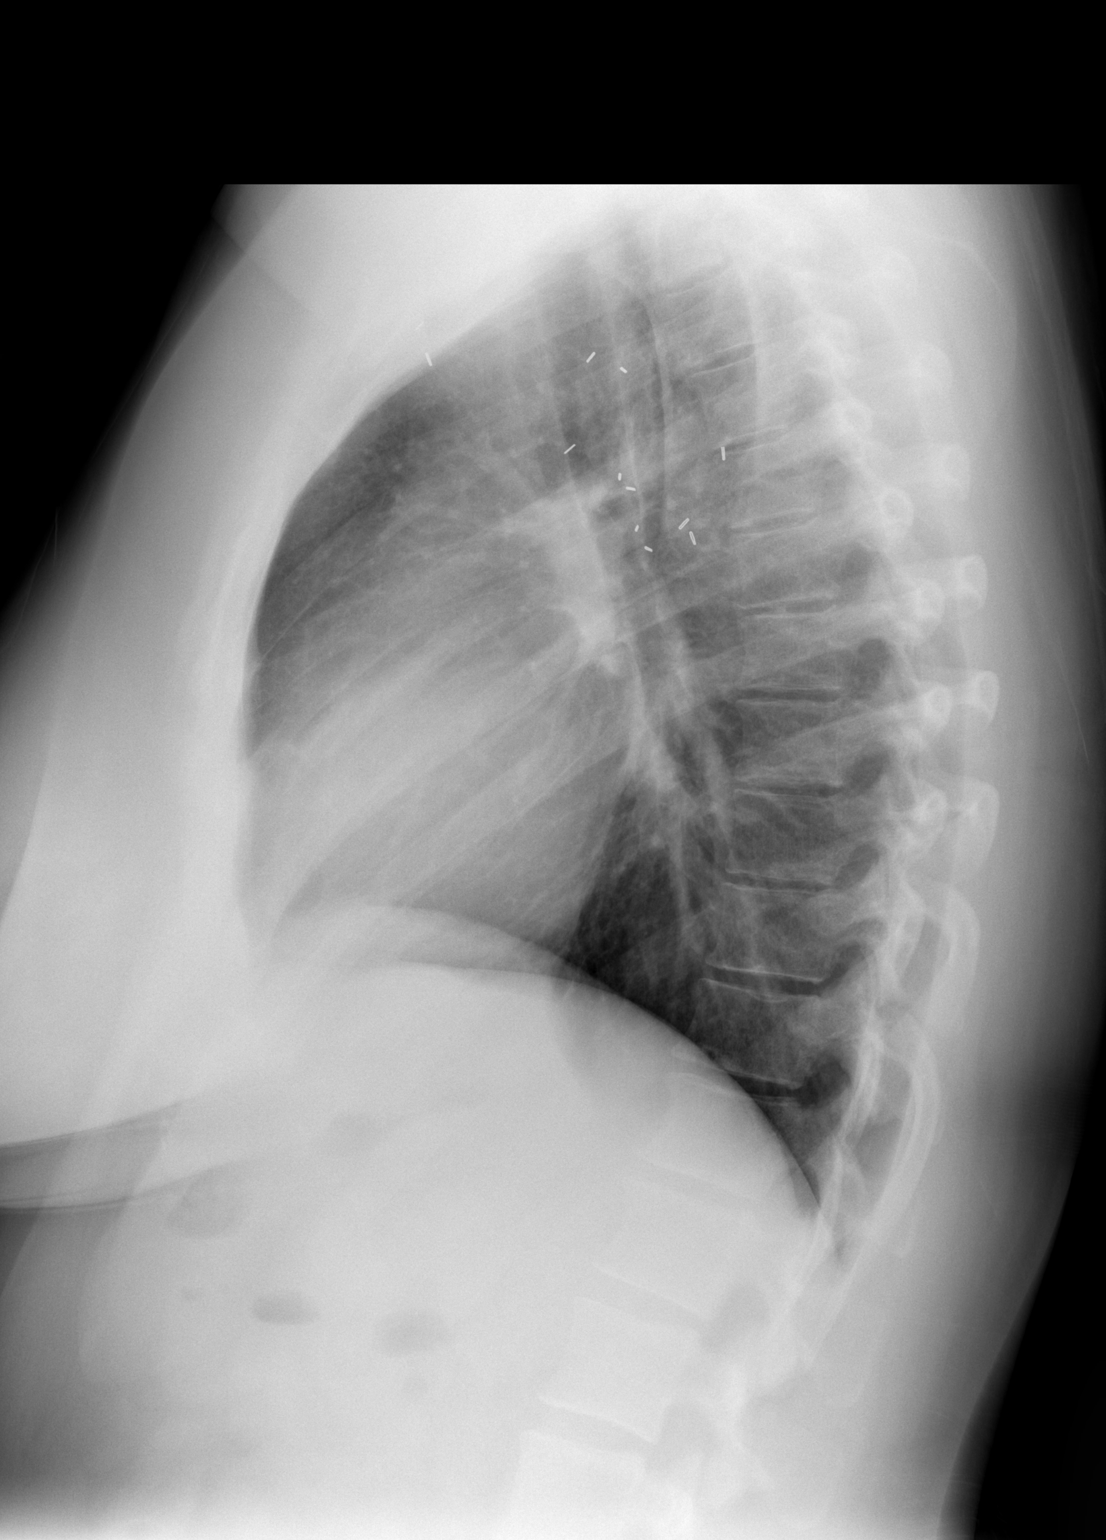

[2 of 2 positions shown; findings below may reference images not displayed]

FINDINGS: Cardiomediastinal silhouette appears normal.  Surgical
clips are seen projected over the superior mediastinum.  No acute
pulmonary disease is noted.  Bony thorax is intact.
IMPRESSION: No acute cardiopulmonary abnormality seen.

## 2013-08-26 ENCOUNTER — Ambulatory Visit (INDEPENDENT_AMBULATORY_CARE_PROVIDER_SITE_OTHER): Payer: 59 | Admitting: Psychiatry

## 2013-08-26 DIAGNOSIS — F319 Bipolar disorder, unspecified: Secondary | ICD-10-CM

## 2013-08-29 NOTE — Patient Instructions (Signed)
Discussed orally 

## 2013-08-29 NOTE — Progress Notes (Addendum)
   THERAPIST PROGRESS NOTE  Session Time: Friday 08/26/2013 3:10 PM - 3:55 PM  Participation Level: Active  Behavioral Response: Fairly GroomedAlertAnxious  Type of Therapy: Individual Therapy  Treatment Goals addressed: Improve communication skills (decrease yelling) and ability to identify and verbalize feelings  Decrease anxiety and improve relaxation/coping techniques  Increase positive thought patterns and decreased negative thought patterns   Interventions: CBT and Supportive  Summary: VERMELL MADRID is a 28 y.o. female who presents with a long-standing history of symptoms of anxiety and depression along with mood swings beginning in early adolescence. Symptoms worsened after the birth of her daughter in January 2011 as patient experienced severe depression. She continues to experience recurrent periods of depression and periods of irritability and anger.  Since last session, patient reports increased stress related to learning last week her father died in 30-May-2013 and her uncle whom she considers a father figure having surgery last week. She is very tearful and emotional in session today as she discusses her father with whom she has not had contact since she was around 31- years-old. She expresses sadness, frustration, and anger that she no longer has a chance to be able to talk with her father and to ask questions. She expresses relief her uncle's surgery went well. Patient reports increased efforts to be assertive rather than aggressive especially with her boyfriend. She is also trying to be assertive rather than aggressive with her mother.   Suicidal/Homicidal: No  Therapist Response: Therapist works with patient to review treatment plan, process grief and loss issues, reinforced patient's efforts to be assertive rather than aggressive, and review coping and relaxation techniques.  Plan: Return again in 6 weeks.  Diagnosis: Axis I: Bipolar Disorder    Axis II: No  diagnosis    Rowyn Spilde, LCSW 08/29/2013

## 2013-09-01 ENCOUNTER — Ambulatory Visit (INDEPENDENT_AMBULATORY_CARE_PROVIDER_SITE_OTHER): Payer: 59 | Admitting: Psychiatry

## 2013-09-01 ENCOUNTER — Ambulatory Visit (INDEPENDENT_AMBULATORY_CARE_PROVIDER_SITE_OTHER): Payer: Medicaid Other | Admitting: Nurse Practitioner

## 2013-09-01 ENCOUNTER — Encounter (HOSPITAL_COMMUNITY): Payer: Self-pay | Admitting: Psychiatry

## 2013-09-01 ENCOUNTER — Encounter: Payer: Self-pay | Admitting: Nurse Practitioner

## 2013-09-01 VITALS — BP 142/88 | Ht 64.0 in | Wt 215.0 lb

## 2013-09-01 VITALS — BP 130/80 | Ht 65.0 in | Wt 215.0 lb

## 2013-09-01 DIAGNOSIS — IMO0001 Reserved for inherently not codable concepts without codable children: Secondary | ICD-10-CM

## 2013-09-01 DIAGNOSIS — Z8659 Personal history of other mental and behavioral disorders: Secondary | ICD-10-CM

## 2013-09-01 DIAGNOSIS — M797 Fibromyalgia: Secondary | ICD-10-CM

## 2013-09-01 DIAGNOSIS — F988 Other specified behavioral and emotional disorders with onset usually occurring in childhood and adolescence: Secondary | ICD-10-CM

## 2013-09-01 DIAGNOSIS — F319 Bipolar disorder, unspecified: Secondary | ICD-10-CM

## 2013-09-01 MED ORDER — CITALOPRAM HYDROBROMIDE 40 MG PO TABS: ORAL_TABLET | ORAL | Status: DC

## 2013-09-01 MED ORDER — AMPHETAMINE-DEXTROAMPHETAMINE 10 MG PO TABS: ORAL_TABLET | ORAL | Status: DC

## 2013-09-01 MED ORDER — AMPHETAMINE-DEXTROAMPHET ER 30 MG PO CP24: 30.0000 mg | ORAL_CAPSULE | Freq: Every day | ORAL | Status: DC

## 2013-09-01 MED ORDER — ALPRAZOLAM 0.5 MG PO TABS: 0.5000 mg | ORAL_TABLET | Freq: Every evening | ORAL | Status: DC | PRN

## 2013-09-01 MED ORDER — GABAPENTIN 600 MG PO TABS: 600.0000 mg | ORAL_TABLET | Freq: Three times a day (TID) | ORAL | Status: DC

## 2013-09-01 MED ORDER — LAMOTRIGINE 200 MG PO TABS: 200.0000 mg | ORAL_TABLET | Freq: Every day | ORAL | Status: DC

## 2013-09-01 NOTE — Progress Notes (Signed)
Patient ID: Kristin Stephens, female   DOB: 07/08/1985, 28 y.o.   MRN: [id number] Patient ID: Kristin Stephens, female   DOB: 1985-08-17, 45 y.o.   MRN: [id number] Patient ID: Kristin Stephens, female   DOB: December 03, 1985, 28 y.o.   MRN: [id number] Patient ID: Kristin Stephens, female   DOB: 04-15-1985, 27 y.o.   MRN: [id number] Patient ID: Kristin Stephens, female   DOB: 1985/06/09, 76 y.o.   MRN: [id number] Patient ID: Kristin Stephens, female   DOB: [birth year], 71 y.o.   MRN: [id number] [facility] [id number] Progress Note [name] MRN: [id number] DOB: 04/07/1985 Age: 28 y.o.  Date: 09/01/2013 Start Time: 9:02 AM End Time: 9:22 AM  Chief Complaint: Chief Complaint  Patient presents with  . Anxiety  . Depression  . ADHD  . Follow-up   Subjective: I've been doing better "  This patient is a 28 year old single white female who lives with her mother and her 24 and a half-year-old daughter. She still dating the father of her baby. She is a Charity fundraiser at [employer].  The patient states that she initially got depressed around age 13. She saw Peggy for counseling for a long time. Her teen years she double her depression by using alcohol marijuana and prescription pills she was also getting into self-mutilation. She was never psychiatrically hospitalized  The patient did well while she was pregnant with her daughter at age 73. However shortly after giving birth she got extremely depressed, she was unable to care for herself or the baby and had absolutely no interest in anything. She also began developing manic symptoms at times when she was very hyperactive "high", and didn't sleep or eat and was hyperalert. The physician had diagnosed her as being bipolar and she was placed on Lamictal.  The patient also has a history of ADHD that has been present since childhood. Adderall has been effective for this. The patient returns after 6  weeks. Last time she was more depressed and we increased her Celexa. She steadily feeling better and her mood is improved. She's focusing well and doing well in school. She recently had some rectal hemorrhoids banded but other than that her health has been stable.  The patient returns after 3 months. For the most part she's doing well. Her health has been good. Unfortunately her 75-year-old daughter is still suffering from seizure disorder. The patient is doing well in school. She found out that her biological father died a few months ago but she has not contact with them since age 63. This still disturbed her however. She's not sleeping as well and I told her to watch this because it can precipitate a manic episode. I told her to take her Xanax each night and also use melatonin and she agrees. She denies suicidal ideation Current psychiatric medication Lamictal 200 mg daily Celexa 40 mg daily Adderall xr 30 mg every morning and Adderall 10 mg after school Xanax  0.5 mg each bedtime  Past psychiatric history Patient has been seeing in this office since April 2012 by Dr Adele Schilder however she has been seeing therapist in this office for more than 10 years.  Patient endorse history of mood swing, depression, anger and poor attention and concentration.  Patient also endorse history of self abusive behavior and endorse cutting herself with a razor blade when her grandmother was dying.  In the past she was prescribed Wellbutrin but she was admitted on  medical for the she never took the Wellbutrin.  Patient denies any history of suicidal attempt or any inpatient psychiatric treatment.  She had history of burst of energy with excessive shopping, cleaning, road rage and impulse eating.   Psychosocial history Patient was raised by her mother and grandmother when her father left when she was only 47 years old.  Patient told her father was never in her life and she was very close to her grandmother.  Patient has a lot of  resentment and anger towards her father.  Patient grandmother died when she was only 4 years old.  Patient has one daughter.  Patient has a very good relationship with her boyfriend whose been very supportive.  Family history family history includes ADD / ADHD in her brother, brother, and sister; Alcohol abuse in her father; Anxiety disorder in her brother; Bipolar disorder in her father and paternal grandmother; Dementia in her maternal grandmother and paternal grandmother; Depression in her mother; Emphysema in her paternal grandmother; Heart disease in her maternal grandmother and paternal grandmother; Hyperlipidemia in her mother; Hypertension in her mother; Personality disorder in her father; Physical abuse in her mother; Prostate cancer in her maternal grandfather; Seizures in her daughter; Sexual abuse in her mother; Stroke in her maternal grandfather. There is no history of Drug abuse, OCD, Paranoid behavior, or Schizophrenia.  Alcohol and substance use history Patient endorse history of using marijuana and drugs and alcohol in her teens when she was self-medicating for her depression and anxiety.  However she began drinking heavily again in the last several months, particularly on weekends. She quit 90 days ago and is attending AA.  Patient denies any history of DWI, tremors or blackout.  Education and work history Patient has GED and currently she is enrolled in [employer]  She is hoping to finish her schooling in 2015.  Patient has a history of attention and concentration problem which was formally tested by psychologist in this office.    Medical history Patient has history of headache, GERD, allergic rhinitis, hypertension, irritable bowel syndrome and aortic Valve repaired.    Mental status examination Patient is well groomed and casually dressed. She is calm and cooperative.  She described her mood is fairly upbeat and her affect is bright  She denies any active or passive suicidal thoughts  or homicidal thoughts. She denies thoughts of self-harm Her speech is  clear and coherent.  Her thought process is logical linear and goal-directed.  There no psychotic symptoms present at this time.  Her attention and concentration is good  She's oriented x3.  Her insight judgment and impulse control is okay.  Lab Results:  Results for orders placed in visit on 03/02/13 (from the past 8736 hour(s))  HEPATIC FUNCTION PANEL   Collection Time    03/02/13  2:35 PM      Result Value Ref Range   Total Bilirubin 0.5  0.3 - 1.2 mg/dL   Bilirubin, Direct 0.1  0.0 - 0.3 mg/dL   Indirect Bilirubin 0.4  0.0 - 0.9 mg/dL   Alkaline Phosphatase 85  39 - 117 U/L   AST 16  0 - 37 U/L   ALT 14  0 - 35 U/L   Total Protein 7.2  6.0 - 8.3 g/dL   Albumin 4.5  3.5 - 5.2 g/dL  Results for orders placed in visit on 02/03/13 (from the past 8736 hour(s))  SEDIMENTATION RATE   Collection Time    02/03/13  3:46 PM  Result Value Ref Range   Sed Rate 22  0 - 22 mm/hr  ANA   Collection Time    02/03/13  3:46 PM      Result Value Ref Range   ANA NEG  NEGATIVE  RHEUMATOID FACTOR   Collection Time    02/03/13  3:46 PM      Result Value Ref Range   Rheumatoid Factor <10  <=14 IU/mL  B. BURGDORFI ANTIBODIES   Collection Time    02/03/13  3:50 PM      Result Value Ref Range   B burgdorferi Ab IgG+IgM 0.11    Results for orders placed during the hospital encounter of 10/11/12 (from the past 8736 hour(s))  URINALYSIS, ROUTINE W REFLEX MICROSCOPIC   Collection Time    10/11/12  3:42 AM      Result Value Ref Range   Color, Urine YELLOW  YELLOW   APPearance CLEAR  CLEAR   Specific Gravity, Urine 1.025  1.005 - 1.030   pH 6.5  5.0 - 8.0   Glucose, UA NEGATIVE  NEGATIVE mg/dL   Hgb urine dipstick TRACE (*) NEGATIVE   Bilirubin Urine SMALL (*) NEGATIVE   Ketones, ur 15 (*) NEGATIVE mg/dL   Protein, ur NEGATIVE  NEGATIVE mg/dL   Urobilinogen, UA 0.2  0.0 - 1.0 mg/dL   Nitrite NEGATIVE  NEGATIVE    Leukocytes, UA NEGATIVE  NEGATIVE  PREGNANCY, URINE   Collection Time    10/11/12  3:42 AM      Result Value Ref Range   Preg Test, Ur NEGATIVE  NEGATIVE  URINE MICROSCOPIC-ADD ON   Collection Time    10/11/12  3:42 AM      Result Value Ref Range   Squamous Epithelial / LPF MANY (*) RARE   WBC, UA 0-2  <3 WBC/hpf   RBC / HPF 0-2  <3 RBC/hpf   Bacteria, UA MANY (*) RARE  CBC WITH DIFFERENTIAL   Collection Time    10/11/12  4:11 AM      Result Value Ref Range   WBC 8.1  4.0 - 10.5 K/uL   RBC 4.67  3.87 - 5.11 MIL/uL   Hemoglobin 13.6  12.0 - 15.0 g/dL   HCT 39.2  36.0 - 46.0 %   MCV 83.9  78.0 - 100.0 fL   MCH 29.1  26.0 - 34.0 pg   MCHC 34.7  30.0 - 36.0 g/dL   RDW 13.6  11.5 - 15.5 %   Platelets 299  150 - 400 K/uL   Neutrophils Relative % 82 (*) 43 - 77 %   Neutro Abs 6.7  1.7 - 7.7 K/uL   Lymphocytes Relative 12  12 - 46 %   Lymphs Abs 1.0  0.7 - 4.0 K/uL   Monocytes Relative 5  3 - 12 %   Monocytes Absolute 0.4  0.1 - 1.0 K/uL   Eosinophils Relative 0  0 - 5 %   Eosinophils Absolute 0.0  0.0 - 0.7 K/uL   Basophils Relative 0  0 - 1 %   Basophils Absolute 0.0  0.0 - 0.1 K/uL  BASIC METABOLIC PANEL   Collection Time    10/11/12  4:11 AM      Result Value Ref Range   Sodium 141  135 - 145 mEq/L   Potassium 3.8  3.5 - 5.1 mEq/L   Chloride 107  96 - 112 mEq/L   CO2 24  19 - 32 mEq/L   Glucose, Bld  120 (*) 70 - 99 mg/dL   BUN 5 (*) 6 - 23 mg/dL   Creatinine, Ser 0.59  0.50 - 1.10 mg/dL   Calcium 9.1  8.4 - 10.5 mg/dL   GFR calc non Af Amer >90  >90 mL/min   GFR calc Af Amer >90  >90 mL/min  LIPASE, BLOOD   Collection Time    10/11/12  4:11 AM      Result Value Ref Range   Lipase 26  11 - 59 U/L  Family doctor draws these  Assessment Axis I bipolar 1 disorder, history of postpartum depression, history of ADHD Axis II deferred Axis III see medical history Axis IV moderate Axis V 60-65  Plan: I took her vitals.  I reviewed CC, tobacco/med/surg Hx, meds  effects/ side effects, problem list, therapies and responses as well as current situation/symptoms discussed options. She will continue her current effective medications and return in 3 months.  See orders and pt instructions for more details.  MEDICATIONS this encounter: Meds ordered this encounter  Medications  . lamoTRIgine (LAMICTAL) 200 MG tablet    Sig: Take 1 tablet (200 mg total) by mouth at bedtime.    Dispense:  30 tablet    Refill:  2  . citalopram (CELEXA) 40 MG tablet    Sig: Take one and one half tablets per day    Dispense:  45 tablet    Refill:  2  . DISCONTD: amphetamine-dextroamphetamine (ADDERALL XR) 30 MG 24 hr capsule    Sig: Take 1 capsule (30 mg total) by mouth daily.    Dispense:  30 capsule    Refill:  0    Do not fill before 11/01/13  . DISCONTD: amphetamine-dextroamphetamine (ADDERALL XR) 30 MG 24 hr capsule    Sig: Take 1 capsule (30 mg total) by mouth daily.    Dispense:  30 capsule    Refill:  0    Do not fill before 10/01/13  . DISCONTD: amphetamine-dextroamphetamine (ADDERALL XR) 30 MG 24 hr capsule    Sig: Take 1 capsule (30 mg total) by mouth daily.    Dispense:  30 capsule    Refill:  0  . amphetamine-dextroamphetamine (ADDERALL XR) 30 MG 24 hr capsule    Sig: Take 1 capsule (30 mg total) by mouth daily.    Dispense:  30 capsule    Refill:  0  . amphetamine-dextroamphetamine (ADDERALL XR) 30 MG 24 hr capsule    Sig: Take 1 capsule (30 mg total) by mouth daily.    Dispense:  30 capsule    Refill:  0    Do not fill before 10/01/13  . amphetamine-dextroamphetamine (ADDERALL XR) 30 MG 24 hr capsule    Sig: Take 1 capsule (30 mg total) by mouth daily.    Dispense:  30 capsule    Refill:  0    Do not fill before 11/01/13  . amphetamine-dextroamphetamine (ADDERALL) 10 MG tablet    Sig: Take one daily at 3 pm    Dispense:  30 tablet    Refill:  0  . amphetamine-dextroamphetamine (ADDERALL) 10 MG tablet    Sig: Take daily at 3 pm    Dispense:  30  tablet    Refill:  0    Do not fill before 10/01/13  . amphetamine-dextroamphetamine (ADDERALL) 10 MG tablet    Sig: Take daily at 3pm    Dispense:  90 tablet    Refill:  0    Do not fill before  11/01/13  . ALPRAZolam (XANAX) 0.5 MG tablet    Sig: Take 1 tablet (0.5 mg total) by mouth at bedtime as needed for sleep.    Dispense:  30 tablet    Refill:  2    Medical Decision Making Problem Points:  Established problem, stable/improving (1), Review of last therapy session (1) and Review of psycho-social stressors (1) Data Points:  Review or order clinical lab tests (1) Review of medication regiment & side effects (2)  I certify that outpatient services furnished can reasonably be expected to improve the patient's condition.   Levonne Spiller, MD

## 2013-09-01 NOTE — Patient Instructions (Signed)
One today;  Tomorrow one twice a day; then one three times a day

## 2013-09-06 ENCOUNTER — Encounter: Payer: Self-pay | Admitting: Nurse Practitioner

## 2013-09-06 NOTE — Progress Notes (Signed)
Subjective:  Presents for recheck of her fibromyalgia. Has been off gabapentin for about a month and a half. Did not think it was helping at that time but has noticed a significant exacerbation of her symptoms since stopping gabapentin. Patient is questioning whether she should switch to Lyrica. Continues followup with mental health.  Objective:   BP 142/88  Ht 5\' 4"  (1.626 m)  Wt 215 lb (97.523 kg)  BMI 36.89 kg/m2 NAD. Alert, oriented. Cheerful affect. Lungs clear. Heart regular rate rhythm.  Assessment: Problem List Items Addressed This Visit     Musculoskeletal and Integument   Fibromyalgia - Primary (Chronic)     Plan: Meds ordered this encounter  Medications  . DiphenhydrAMINE HCl (BENADRYL ALLERGY PO)    Sig: Take by mouth.  . gabapentin (NEURONTIN) 600 MG tablet    Sig: Take 1 tablet (600 mg total) by mouth 3 (three) times daily.    Dispense:  90 tablet    Refill:  2    Order Specific Question:  Supervising Provider    Answer:  Mikey Kirschner [[id number]]   Length the discussion regarding choices for her fibromyalgia. Discussed potential adverse effects associated with Lyrica. Patient wishes to restart gabapentin but we will increase the dose to 600 3 times a day. Patient to call back in a few weeks if no improvement in fibromyalgia symptoms. Reviewed alternative therapies to help with symptoms including ice/heat applications, massage therapy, stress reduction, reflexology. Return in about 3 months (around 12/02/2013), or if symptoms worsen or fail to improve.

## 2013-09-29 ENCOUNTER — Ambulatory Visit (INDEPENDENT_AMBULATORY_CARE_PROVIDER_SITE_OTHER): Payer: Medicaid Other | Admitting: Nurse Practitioner

## 2013-09-29 ENCOUNTER — Encounter: Payer: Self-pay | Admitting: Nurse Practitioner

## 2013-09-29 VITALS — BP 130/84 | Temp 98.9°F | Ht 65.0 in | Wt 219.0 lb

## 2013-09-29 DIAGNOSIS — J329 Chronic sinusitis, unspecified: Secondary | ICD-10-CM

## 2013-09-29 MED ORDER — CEFDINIR 300 MG PO CAPS: 300.0000 mg | ORAL_CAPSULE | Freq: Two times a day (BID) | ORAL | Status: DC

## 2013-10-03 ENCOUNTER — Encounter: Payer: Self-pay | Admitting: Nurse Practitioner

## 2013-10-03 NOTE — Progress Notes (Signed)
Subjective:  Presents complaints of sinus congestion for the past 3 weeks. Began having green mucus yesterday. Cough mainly in the mornings. No wheezing. Frontal area headache. No fever. No ear pain or sore throat. No relief with OTC Sudafed.  Objective:   BP 130/84  Temp(Src) 98.9 F (37.2 C) (Oral)  Ht 5\' 5"  (1.651 m)  Wt 219 lb (99.338 kg)  BMI 36.44 kg/m2 NAD. Alert, oriented. TMs clear effusion, no erythema. Pharynx injected with green PND noted. Neck supple with mild soft anterior adenopathy. Lungs clear. Heart regular rate rhythm.  Assessment: Rhinosinusitis  Plan:  Meds ordered this encounter  Medications  . cefdinir (OMNICEF) 300 MG capsule    Sig: Take 1 capsule (300 mg total) by mouth 2 (two) times daily.    Dispense:  20 capsule    Refill:  0    Order Specific Question:  Supervising Provider    Answer:  Mikey Kirschner [[id number]]   Restart Flonase as directed. OTC antihistamines as directed. Call back if symptoms worsen or persist.

## 2013-10-04 ENCOUNTER — Telehealth: Payer: Self-pay | Admitting: Family Medicine

## 2013-10-04 MED ORDER — BENZONATATE 100 MG PO CAPS: 100.0000 mg | ORAL_CAPSULE | Freq: Three times a day (TID) | ORAL | Status: DC | PRN

## 2013-10-04 NOTE — Telephone Encounter (Signed)
Rx sent electronically to pharmacy. Patient notified. 

## 2013-10-04 NOTE — Telephone Encounter (Signed)
Kristin Stephens 100 mg twenty four one q 6 hrs prn cough

## 2013-10-04 NOTE — Telephone Encounter (Signed)
Pt seen by Hoyle Sauer 7/2 her cough has continued to get worse And wants to know if there is something she can get called in  Or she can get OTC to use? She has been trying Robitussin  But its not helping her to sleep at night   [place]

## 2013-10-10 ENCOUNTER — Ambulatory Visit (INDEPENDENT_AMBULATORY_CARE_PROVIDER_SITE_OTHER): Payer: 59 | Admitting: Psychiatry

## 2013-10-10 DIAGNOSIS — F3130 Bipolar disorder, current episode depressed, mild or moderate severity, unspecified: Secondary | ICD-10-CM

## 2013-10-10 NOTE — Patient Instructions (Signed)
Discussed orally 

## 2013-10-10 NOTE — Progress Notes (Signed)
   THERAPIST PROGRESS NOTE  Session Time: Monday 10/10/2013 11:20 AM - 11:55 AM  Participation Level: Active  Behavioral Response: CasualAlertAnxious  Type of Therapy: Individual Therapy  Treatment Goals addressed: Improve communication skills (decrease yelling) and ability to identify and verbalize feelings  Decrease anxiety and improve relaxation/coping techniques  Increase positive thought patterns and decreased negative thought patterns    Interventions: CBT and Supportive  Summary: Kristin Stephens is a 28 y.o. female who presents witha long-standing history of symptoms of anxiety and depression along with mood swings beginning in early adolescence. Symptoms worsened after the birth of her daughter in January 2011 as patient experienced severe depression. She continues to experience recurrent periods of depression and periods of irritability and anger.  Patient reports doing well since last session. She has had nor anger outbursts. She reports decreased stress in relationship with mother. She expresses increased acceptance of her father's death. Patient is experiencing anxiety and obsessive thoughts regarding daughter having an ambulatory EEG next week.   Suicidal/Homicidal: No  Therapist Response: Therapist works with patient to process feelings, identify thought patterns and effects on mood/behavior, identify ways to limit excessive worry,   Plan: Return again in 7-8 weeks.  Diagnosis: Axis I: Bipolar 1 disorder    Axis II: Deferred    Ernestyne Caldwell, LCSW 10/10/2013

## 2013-10-12 ENCOUNTER — Ambulatory Visit (INDEPENDENT_AMBULATORY_CARE_PROVIDER_SITE_OTHER): Payer: Medicaid Other | Admitting: *Deleted

## 2013-10-12 DIAGNOSIS — Z308 Encounter for other contraceptive management: Secondary | ICD-10-CM

## 2013-10-12 MED ORDER — MEDROXYPROGESTERONE ACETATE 150 MG/ML IM SUSP
150.0000 mg | Freq: Once | INTRAMUSCULAR | Status: AC
  Administered 2013-10-12: 150 mg via INTRAMUSCULAR

## 2013-11-29 ENCOUNTER — Ambulatory Visit (HOSPITAL_COMMUNITY): Payer: Self-pay | Admitting: Psychiatry

## 2013-11-29 ENCOUNTER — Encounter (HOSPITAL_COMMUNITY): Payer: Self-pay | Admitting: Psychiatry

## 2013-12-02 ENCOUNTER — Encounter: Payer: Self-pay | Admitting: Nurse Practitioner

## 2013-12-02 ENCOUNTER — Ambulatory Visit: Payer: Medicaid Other | Admitting: Nurse Practitioner

## 2013-12-02 ENCOUNTER — Ambulatory Visit (INDEPENDENT_AMBULATORY_CARE_PROVIDER_SITE_OTHER): Payer: 59 | Admitting: Psychiatry

## 2013-12-02 ENCOUNTER — Ambulatory Visit (INDEPENDENT_AMBULATORY_CARE_PROVIDER_SITE_OTHER): Payer: Medicaid Other | Admitting: Nurse Practitioner

## 2013-12-02 ENCOUNTER — Encounter (HOSPITAL_COMMUNITY): Payer: Self-pay | Admitting: Psychiatry

## 2013-12-02 VITALS — BP 130/82 | Ht 65.0 in | Wt 217.0 lb

## 2013-12-02 VITALS — BP 124/74 | HR 88 | Ht 65.0 in | Wt 216.6 lb

## 2013-12-02 DIAGNOSIS — J329 Chronic sinusitis, unspecified: Secondary | ICD-10-CM

## 2013-12-02 DIAGNOSIS — F3130 Bipolar disorder, current episode depressed, mild or moderate severity, unspecified: Secondary | ICD-10-CM

## 2013-12-02 MED ORDER — AMPHETAMINE-DEXTROAMPHET ER 30 MG PO CP24: 30.0000 mg | ORAL_CAPSULE | Freq: Every day | ORAL | Status: DC

## 2013-12-02 MED ORDER — LAMOTRIGINE 200 MG PO TABS: 200.0000 mg | ORAL_TABLET | Freq: Every day | ORAL | Status: DC

## 2013-12-02 MED ORDER — ALPRAZOLAM 1 MG PO TABS: 1.0000 mg | ORAL_TABLET | Freq: Every day | ORAL | Status: DC

## 2013-12-02 MED ORDER — AMPHETAMINE-DEXTROAMPHETAMINE 10 MG PO TABS: ORAL_TABLET | ORAL | Status: DC

## 2013-12-02 MED ORDER — CITALOPRAM HYDROBROMIDE 40 MG PO TABS: ORAL_TABLET | ORAL | Status: DC

## 2013-12-02 MED ORDER — AMOXICILLIN-POT CLAVULANATE 875-125 MG PO TABS: 1.0000 | ORAL_TABLET | Freq: Two times a day (BID) | ORAL | Status: DC

## 2013-12-02 NOTE — Progress Notes (Signed)
Patient ID: Kristin Stephens, female   DOB: 10-Apr-1985, 29 y.o.   MRN: [id number] Patient ID: Kristin Stephens, female   DOB: 07-31-1985, 72 y.o.   MRN: [id number] Patient ID: Kristin Stephens, female   DOB: [birth year], 35 y.o.   MRN: [id number] Patient ID: Kristin Stephens, female   DOB: Mar 12, 1986, 79 y.o.   MRN: [id number] Patient ID: Kristin Stephens, female   DOB: 1985/10/07, 70 y.o.   MRN: [id number] Patient ID: Kristin Stephens, female   DOB: [birth year], 50 y.o.   MRN: [id number] Patient ID: Kristin Stephens, female   DOB: Dec 04, 1985, 17 y.o.   MRN: [id number] [facility] [id number] Progress Note Kristin Stephens MRN: [id number] DOB: 17-Mar-1986 Age: 28 y.o.  Date: 12/02/2013 Start Time: 9:02 AM End Time: 9:22 AM  Chief Complaint: Chief Complaint  Patient presents with  . Anxiety  . Depression  . Follow-up  . ADHD   Subjective: I've been out of sorts "  This patient is a 28 year old single white female who lives with her mother and her 62 and a half-year-old daughter. She still dating the father of her baby. She is a Charity fundraiser at [employer].  The patient states that she initially got depressed around age 15. She saw Peggy for counseling for a long time. Her teen years she double her depression by using alcohol marijuana and prescription pills she was also getting into self-mutilation. She was never psychiatrically hospitalized  The patient did well while she was pregnant with her daughter at age 89. However shortly after giving birth she got extremely depressed, she was unable to care for herself or the baby and had absolutely no interest in anything. She also began developing manic symptoms at times when she was very hyperactive "high", and didn't sleep or eat and was hyperalert. The physician had diagnosed her as being bipolar and she was placed on Lamictal.  The patient also has a history of ADHD that has been present  since childhood. Adderall has been effective for this. The patient returns after 6 weeks. Last time she was more depressed and we increased her Celexa. She steadily feeling better and her mood is improved. She's focusing well and doing well in school. She recently had some rectal hemorrhoids banded but other than that her health has been stable.  The patient returns after 3 months. She's not been herself for the last few weeks and feels more angry and irritable. She states the last time she filled her Lamictal and Celexa she was  At [employer] and got them at different drugstore. The pills were different generics than what she is used to. She thinks this has made a difference. She's not suicidal and she still functioning well at school and home. She's going to go back to [place] and get the medicines she is use to see if this will change things Current psychiatric medication Lamictal 200 mg daily Celexa 40 mg daily Adderall xr 30 mg every morning and Adderall 10 mg after school Xanax  0.5 mg each bedtime  Past psychiatric history Patient has been seeing in this office since April 2012 by Dr Adele Schilder however she has been seeing therapist in this office for more than 10 years.  Patient endorse history of mood swing, depression, anger and poor attention and concentration.  Patient also endorse history of self abusive behavior and endorse cutting herself with a razor blade when her grandmother was dying.  In the past she was prescribed Wellbutrin but she was admitted on medical for the she never took the Wellbutrin.  Patient denies any history of suicidal attempt or any inpatient psychiatric treatment.  She had history of burst of energy with excessive shopping, cleaning, road rage and impulse eating.   Psychosocial history Patient was raised by her mother and grandmother when her father left when she was only 57 years old.  Patient told her father was never in her life and she was very close  to her grandmother.  Patient has a lot of resentment and anger towards her father.  Patient grandmother died when she was only 37 years old.  Patient has one daughter.  Patient has a very good relationship with her boyfriend whose been very supportive.  Family history family history includes ADD / ADHD in her brother, brother, and sister; Alcohol abuse in her father; Anxiety disorder in her brother; Bipolar disorder in her father and paternal grandmother; Dementia in her maternal grandmother and paternal grandmother; Depression in her mother; Emphysema in her paternal grandmother; Heart disease in her maternal grandmother and paternal grandmother; Hyperlipidemia in her mother; Hypertension in her mother; Personality disorder in her father; Physical abuse in her mother; Prostate cancer in her maternal grandfather; Seizures in her daughter; Sexual abuse in her mother; Stroke in her maternal grandfather. There is no history of Drug abuse, OCD, Paranoid behavior, or Schizophrenia.  Alcohol and substance use history Patient endorse history of using marijuana and drugs and alcohol in her teens when she was self-medicating for her depression and anxiety.  However she began drinking heavily again in the last several months, particularly on weekends. She quit 90 days ago and is attending AA.  Patient denies any history of DWI, tremors or blackout.  Education and work history Patient has GED and currently she is enrolled in [employer]  She is hoping to finish her schooling in 2015.  Patient has a history of attention and concentration problem which was formally tested by psychologist in this office.    Medical history Patient has history of headache, GERD, allergic rhinitis, hypertension, irritable bowel syndrome and aortic Valve repaired.    Mental status examination Patient is well groomed and casually dressed. She is calm and cooperative.  She described her mood is a bit irritable and her affect is bright  She  denies any active or passive suicidal thoughts or homicidal thoughts. She denies thoughts of self-harm Her speech is  clear and coherent.  Her thought process is logical linear and goal-directed.  There no psychotic symptoms present at this time.  Her attention and concentration is good  She's oriented x3.  Her insight judgment and impulse control is okay.  Lab Results:  Results for orders placed in visit on 03/02/13 (from the past 8736 hour(s))  HEPATIC FUNCTION PANEL   Collection Time    03/02/13  2:35 PM      Result Value Ref Range   Total Bilirubin 0.5  0.3 - 1.2 mg/dL   Bilirubin, Direct 0.1  0.0 - 0.3 mg/dL   Indirect Bilirubin 0.4  0.0 - 0.9 mg/dL   Alkaline Phosphatase 85  39 - 117 U/L   AST 16  0 - 37 U/L   ALT 14  0 - 35 U/L   Total Protein 7.2  6.0 - 8.3 g/dL   Albumin 4.5  3.5 - 5.2 g/dL  Results for orders placed in visit on 02/03/13 (from the past 8736 hour(s))  SEDIMENTATION RATE  Collection Time    02/03/13  3:46 PM      Result Value Ref Range   Sed Rate 22  0 - 22 mm/hr  ANA   Collection Time    02/03/13  3:46 PM      Result Value Ref Range   ANA NEG  NEGATIVE  RHEUMATOID FACTOR   Collection Time    02/03/13  3:46 PM      Result Value Ref Range   Rheumatoid Factor <10  <=14 IU/mL  B. BURGDORFI ANTIBODIES   Collection Time    02/03/13  3:50 PM      Result Value Ref Range   B burgdorferi Ab IgG+IgM 0.11    Family doctor draws these  Assessment Axis I bipolar 1 disorder, history of postpartum depression, history of ADHD Axis II deferred Axis III see medical history Axis IV moderate Axis V 60-65  Plan: I took her vitals.  I reviewed CC, tobacco/med/surg Hx, meds effects/ side effects, problem list, therapies and responses as well as current situation/symptoms discussed options. She will continue her current effective medications and return in 3 months. She will call in a couple weeks of going back to her regular medicines does not help See orders and pt  instructions for more details.  MEDICATIONS this encounter: Meds ordered this encounter  Medications  . citalopram (CELEXA) 40 MG tablet    Sig: Take one and one half tablets per day    Dispense:  45 tablet    Refill:  2  . lamoTRIgine (LAMICTAL) 200 MG tablet    Sig: Take 1 tablet (200 mg total) by mouth at bedtime.    Dispense:  30 tablet    Refill:  2  . ALPRAZolam (XANAX) 1 MG tablet    Sig: Take 1 tablet (1 mg total) by mouth at bedtime.    Dispense:  30 tablet    Refill:  2  . amphetamine-dextroamphetamine (ADDERALL XR) 30 MG 24 hr capsule    Sig: Take 1 capsule (30 mg total) by mouth daily.    Dispense:  30 capsule    Refill:  0  . amphetamine-dextroamphetamine (ADDERALL XR) 30 MG 24 hr capsule    Sig: Take 1 capsule (30 mg total) by mouth daily.    Dispense:  30 capsule    Refill:  0    Do not fill before 01/01/14  . amphetamine-dextroamphetamine (ADDERALL XR) 30 MG 24 hr capsule    Sig: Take 1 capsule (30 mg total) by mouth daily.    Dispense:  30 capsule    Refill:  0    Do not fill before 02/01/14  . amphetamine-dextroamphetamine (ADDERALL) 10 MG tablet    Sig: Take one daily at 3 pm    Dispense:  30 tablet    Refill:  0  . amphetamine-dextroamphetamine (ADDERALL) 10 MG tablet    Sig: Take daily at 3 pm    Dispense:  30 tablet    Refill:  0    Do not fill before 01/01/14  . amphetamine-dextroamphetamine (ADDERALL) 10 MG tablet    Sig: Take daily at 3pm    Dispense:  30 tablet    Refill:  0    Do not fill before 02/01/14    Medical Decision Making Problem Points:  Established problem, stable/improving (1), Review of last therapy session (1) and Review of psycho-social stressors (1) Data Points:  Review or order clinical lab tests (1) Review of medication regiment & side effects (2)  I certify that outpatient services furnished can reasonably be expected to improve the patient's condition.   Levonne Spiller, MD

## 2013-12-05 ENCOUNTER — Encounter: Payer: Self-pay | Admitting: Nurse Practitioner

## 2013-12-05 NOTE — Progress Notes (Signed)
Subjective:  Presents for complaints of frequent cough producing green mucus that began about a week ago. Postnasal drainage. Facial area headache. No fever. No sore throat. No ear pain. Slight wheezing, has used her inhaler x1. Continues to smoke.  Objective:   BP 130/82  Ht 5\' 5"  (1.651 m)  Wt 217 lb (98.431 kg)  BMI 36.11 kg/m2 NAD. Alert, oriented. TMs retracted, no erythema. Pharynx injected with PND noted. Neck supple with mild soft anterior adenopathy. Lungs clear. Heart regular rate rhythm.  Assessment: Rhinosinusitis  Plan: Meds ordered this encounter  Medications  . amoxicillin-clavulanate (AUGMENTIN) 875-125 MG per tablet    Sig: Take 1 tablet by mouth 2 (two) times daily.    Dispense:  20 tablet    Refill:  0    Order Specific Question:  Supervising Provider    Answer:  Mikey Kirschner [[id number]]   Restart Flonase and Claritin daily. Call back if worsens or persists.

## 2013-12-06 ENCOUNTER — Encounter: Payer: Self-pay | Admitting: Nurse Practitioner

## 2013-12-06 ENCOUNTER — Ambulatory Visit (INDEPENDENT_AMBULATORY_CARE_PROVIDER_SITE_OTHER): Payer: Medicaid Other | Admitting: Nurse Practitioner

## 2013-12-06 VITALS — BP 140/88 | Ht 65.0 in | Wt 215.0 lb

## 2013-12-06 DIAGNOSIS — L858 Other specified epidermal thickening: Secondary | ICD-10-CM

## 2013-12-06 DIAGNOSIS — Q828 Other specified congenital malformations of skin: Secondary | ICD-10-CM

## 2013-12-06 DIAGNOSIS — D1801 Hemangioma of skin and subcutaneous tissue: Secondary | ICD-10-CM

## 2013-12-06 DIAGNOSIS — I781 Nevus, non-neoplastic: Secondary | ICD-10-CM

## 2013-12-06 NOTE — Progress Notes (Signed)
Subjective:  Presents for c/o of a few tiny red spots mainly on trunk. Minimal change in size. Used to tan regularly, but has stopped this. Also c/o "chicken skin" on the outer arms and thighs. Has had this for years. Just started using [employer] rough and bumpy skin cream.  Objective:   BP 140/88  Ht 5\' 5"  (1.651 m)  Wt 215 lb (97.523 kg)  BMI 35.78 kg/m2 NAD. Alert, oriented. Several tiny pinpoint red dots on trunk consistent with early cherry hemangiomas. Dry nonerythematous papules noted outer arms and thighs.   Assessment: Cherry hemangioma  Keratosis pilaris  Plan: avoid further tanning. Understands these areas may become larger and more in number over time. Continue OTC products for KP. Avoid harsh scrubs and topical steroids. Recheck if any problems.

## 2013-12-11 ENCOUNTER — Other Ambulatory Visit: Payer: Self-pay | Admitting: Gastroenterology

## 2013-12-14 ENCOUNTER — Telehealth: Payer: Self-pay | Admitting: Gastroenterology

## 2013-12-14 NOTE — Telephone Encounter (Signed)
Tried to call pt . Mail box full and could not leave a message. Dexilant 60 mg #10 at front for pick up and Almyra Free is working on [place].

## 2013-12-14 NOTE — Telephone Encounter (Signed)
Pt called to follow up on PA for her dexilant and could she get some samples since she is having to wait. Please advise. [phone]

## 2013-12-26 NOTE — Telephone Encounter (Signed)
PA with [place] medicaid has been approved. Pharmacy is aware.

## 2013-12-28 ENCOUNTER — Ambulatory Visit: Payer: Medicaid Other

## 2014-01-05 ENCOUNTER — Ambulatory Visit: Payer: Medicaid Other

## 2014-01-06 ENCOUNTER — Ambulatory Visit (INDEPENDENT_AMBULATORY_CARE_PROVIDER_SITE_OTHER): Payer: Medicaid Other | Admitting: *Deleted

## 2014-01-06 DIAGNOSIS — Z3042 Encounter for surveillance of injectable contraceptive: Secondary | ICD-10-CM

## 2014-01-06 MED ORDER — MEDROXYPROGESTERONE ACETATE 150 MG/ML IM SUSP
150.0000 mg | Freq: Once | INTRAMUSCULAR | Status: AC
  Administered 2014-01-06: 150 mg via INTRAMUSCULAR

## 2014-01-12 ENCOUNTER — Telehealth: Payer: Self-pay | Admitting: *Deleted

## 2014-01-12 ENCOUNTER — Ambulatory Visit (INDEPENDENT_AMBULATORY_CARE_PROVIDER_SITE_OTHER): Payer: Medicaid Other | Admitting: Nurse Practitioner

## 2014-01-12 ENCOUNTER — Encounter: Payer: Self-pay | Admitting: Nurse Practitioner

## 2014-01-12 VITALS — BP 134/82 | Ht 65.0 in | Wt 213.0 lb

## 2014-01-12 DIAGNOSIS — M797 Fibromyalgia: Secondary | ICD-10-CM

## 2014-01-12 DIAGNOSIS — G5621 Lesion of ulnar nerve, right upper limb: Secondary | ICD-10-CM

## 2014-01-12 MED ORDER — HYDROCODONE-ACETAMINOPHEN 5-325 MG PO TABS
ORAL_TABLET | ORAL | Status: DC
Start: 2014-01-12 — End: 2014-03-27

## 2014-01-12 NOTE — Progress Notes (Signed)
Subjective:  Presents for complaints of numbness in her right small finger and part of her ring finger over the past several weeks. Began off and on, now fairly constant. No specific history of injury. Has her arm bent a lot while writing and studying. No neck or shoulder pain. Mild tenderness around the elbow at times. No wrist tenderness. Also complaints of severe pain with her fibromyalgia on a rare occasion seems to be worse when it is raining. Requesting occasional pain medication only for days when it is severe. Has taken one hydrocodone on these days in the past which helps her function and brings pain to a tolerable level. History of seasonal effective disorder, does light therapy especially during the winter.  Objective:   BP 134/82  Ht 5\' 5"  (1.651 m)  Wt 213 lb (96.616 kg)  BMI 35.44 kg/m2 NAD. Alert, oriented. Cheerful affect. Lungs clear. Heart regular rhythm. Good ROM of the neck right shoulder and elbow. Mild tenderness noted around the right elbow joint. Hand strength 5+ bilateral. Strong radial pulse on the right. Sensation present in the right fingers but diminished particularly in the ring and small finger right side.  Assessment:  Problem List Items Addressed This Visit     Musculoskeletal and Integument   Fibromyalgia (Chronic)    Other Visit Diagnoses   Ulnar neuropathy at wrist, right    -  Primary    Relevant Orders       Nerve conduction test      Plan:  Meds ordered this encounter  Medications  . HYDROcodone-acetaminophen (NORCO/VICODIN) 5-325 MG per tablet    Sig: One po qd prn severe pain    Dispense:  20 tablet    Refill:  0    Order Specific Question:  Supervising Provider    Answer:  Mikey Kirschner [[id number]]   Further followup based on NCV test. Very limited use of hydrocodone only for days with severe pain. Patient understands and agrees to limit use of narcotic pain reliever. Recommend flu vaccine at local health department. Return in about 3 months  (around 04/14/2014).

## 2014-01-17 ENCOUNTER — Telehealth: Payer: Self-pay | Admitting: Nurse Practitioner

## 2014-01-17 NOTE — Telephone Encounter (Signed)
Patient was set up last week to have nerve conduction study done at Dr. Alessandra Grout office.  She said that she found out that Dr. Merlene Laughter will be actually doing the test and wants to know if Hoyle Sauer is comfortable with this?  If not, she would need to be sent somewhere else to have this done.

## 2014-01-17 NOTE — Telephone Encounter (Signed)
Patient advised and will do test as ordered.

## 2014-01-17 NOTE — Telephone Encounter (Signed)
Yes it is fine for him to do test. If any abnormalities, we can still send her to someone else. Other option cancel this test and refer her to specialist in [place].

## 2014-02-01 ENCOUNTER — Telehealth: Payer: Self-pay | Admitting: Family Medicine

## 2014-02-01 NOTE — Telephone Encounter (Signed)
Please review report from Dr. Merlene Laughter.  This was placed in yellow folder and put in Carolyns basket.

## 2014-02-02 NOTE — Telephone Encounter (Signed)
That is fine. This would be a one time consultation for brace only. Not a continuous need.

## 2014-02-02 NOTE — Telephone Encounter (Signed)
To be honest, I am not sure what he means; "elbow braces" is a broad term; it can be as simple as a velcro band or something more sophisticated.  Can we consultation with OT or PT?

## 2014-02-02 NOTE — Telephone Encounter (Signed)
Patient states that she will call Dr. Freddie Apley office and get more clarity from him. She is unable to do PT right now due to her school schedule. Patient will call back if she needs anything further.

## 2014-02-02 NOTE — Telephone Encounter (Signed)
Patient states that Dr. Merlene Laughter did not really explain the elbow braces to her. She would like you to explain to her what they are and how they treat the area and she would like it sent to [facility].

## 2014-02-02 NOTE — Telephone Encounter (Signed)
Nerve conduction report shows bilateral mild neuropathy at elbow.  Note said Dr. Merlene Laughter discussed techniques to avoid further compression such as elbow braces.  Does she want to try these first? Does she want to proceed with referral?

## 2014-02-06 ENCOUNTER — Encounter: Payer: Self-pay | Admitting: Gastroenterology

## 2014-02-13 ENCOUNTER — Other Ambulatory Visit: Payer: Self-pay | Admitting: Nurse Practitioner

## 2014-02-22 ENCOUNTER — Telehealth: Payer: Self-pay | Admitting: *Deleted

## 2014-02-22 NOTE — Telephone Encounter (Signed)
PA   APPROVED DEXILANT 60 MG DELAYED RELEASE CAPSULE

## 2014-03-03 ENCOUNTER — Other Ambulatory Visit (HOSPITAL_COMMUNITY): Payer: Self-pay | Admitting: Psychiatry

## 2014-03-03 ENCOUNTER — Ambulatory Visit (INDEPENDENT_AMBULATORY_CARE_PROVIDER_SITE_OTHER): Payer: Medicaid Other | Admitting: Family Medicine

## 2014-03-03 ENCOUNTER — Encounter: Payer: Self-pay | Admitting: Family Medicine

## 2014-03-03 VITALS — BP 130/82 | Temp 98.5°F | Ht 65.0 in | Wt 219.0 lb

## 2014-03-03 DIAGNOSIS — J329 Chronic sinusitis, unspecified: Secondary | ICD-10-CM

## 2014-03-03 DIAGNOSIS — J310 Chronic rhinitis: Secondary | ICD-10-CM

## 2014-03-03 MED ORDER — AMPHETAMINE-DEXTROAMPHET ER 30 MG PO CP24: 30.0000 mg | ORAL_CAPSULE | Freq: Every day | ORAL | Status: DC

## 2014-03-03 MED ORDER — AMPHETAMINE-DEXTROAMPHETAMINE 10 MG PO TABS: ORAL_TABLET | ORAL | Status: DC

## 2014-03-03 MED ORDER — AMOXICILLIN-POT CLAVULANATE 875-125 MG PO TABS: 1.0000 | ORAL_TABLET | Freq: Two times a day (BID) | ORAL | Status: DC

## 2014-03-03 MED ORDER — CITALOPRAM HYDROBROMIDE 40 MG PO TABS: ORAL_TABLET | ORAL | Status: DC

## 2014-03-03 MED ORDER — LAMOTRIGINE 200 MG PO TABS: 200.0000 mg | ORAL_TABLET | Freq: Every day | ORAL | Status: DC

## 2014-03-03 NOTE — Progress Notes (Signed)
   Subjective:    Patient ID: Kristin Stephens, female    DOB: 11/12/1985, 28 y.o.   MRN: [id number]  Cough This is a new problem. The current episode started yesterday. The problem has been gradually worsening. Associated symptoms include chills, myalgias, rhinorrhea and a sore throat. Nothing aggravates the symptoms. She has tried nothing for the symptoms.     achey and cough  Cough is porductive  achey  dimished energy   Review of Systems  Constitutional: Positive for chills.  HENT: Positive for rhinorrhea and sore throat.   Respiratory: Positive for cough.   Musculoskeletal: Positive for myalgias.       Objective:   Physical Exam  Alert moderate malaise. Vital stable HET moderate nasal congestion pharynx erythematous neck supple. Lungs clear heart regular in rhythm.      Assessment & Plan:  Impression post viral rhinosinusitis plan antibiotics prescribed. Symptomatic care discussed. Seen after-hours. WSL

## 2014-03-07 ENCOUNTER — Telehealth (HOSPITAL_COMMUNITY): Payer: Self-pay | Admitting: *Deleted

## 2014-03-07 NOTE — Telephone Encounter (Signed)
Pt came in to get her printed script. Pt agreed with printed script. Pt D/L number is [phone].

## 2014-03-09 ENCOUNTER — Encounter (HOSPITAL_COMMUNITY): Payer: Self-pay | Admitting: Psychiatry

## 2014-03-09 ENCOUNTER — Ambulatory Visit (INDEPENDENT_AMBULATORY_CARE_PROVIDER_SITE_OTHER): Payer: MEDICAID | Admitting: Psychiatry

## 2014-03-09 VITALS — BP 116/63 | HR 95 | Ht 65.0 in | Wt 217.8 lb

## 2014-03-09 DIAGNOSIS — F3130 Bipolar disorder, current episode depressed, mild or moderate severity, unspecified: Secondary | ICD-10-CM

## 2014-03-09 DIAGNOSIS — F319 Bipolar disorder, unspecified: Secondary | ICD-10-CM

## 2014-03-09 MED ORDER — ALPRAZOLAM 1 MG PO TABS: 1.0000 mg | ORAL_TABLET | Freq: Every day | ORAL | Status: DC

## 2014-03-09 MED ORDER — AMPHETAMINE-DEXTROAMPHET ER 30 MG PO CP24: 30.0000 mg | ORAL_CAPSULE | Freq: Every day | ORAL | Status: DC

## 2014-03-09 MED ORDER — CITALOPRAM HYDROBROMIDE 40 MG PO TABS: ORAL_TABLET | ORAL | Status: DC

## 2014-03-09 MED ORDER — AMPHETAMINE-DEXTROAMPHETAMINE 10 MG PO TABS: ORAL_TABLET | ORAL | Status: DC

## 2014-03-09 MED ORDER — LAMOTRIGINE 200 MG PO TABS: 200.0000 mg | ORAL_TABLET | Freq: Every day | ORAL | Status: DC

## 2014-03-09 NOTE — Progress Notes (Signed)
Patient ID: Kristin Stephens, female   DOB: 21-May-1985, 28 y.o.   MRN: [id number] Patient ID: Kristin Stephens, female   DOB: [birth year], 28 y.o.   MRN: [id number] Patient ID: Kristin Stephens, female   DOB: 1985/11/18, 28 y.o.   MRN: [id number] Patient ID: Kristin Stephens, female   DOB: February 14, 1986, 28 y.o.   MRN: [id number] Patient ID: Kristin Stephens, female   DOB: Aug 29, 1985, 28 y.o.   MRN: [id number] Patient ID: Kristin Stephens, female   DOB: 03/03/1986, 28 y.o.   MRN: [id number] Patient ID: Kristin Stephens, female   DOB: 07/04/1985, 28 y.o.   MRN: [id number] Patient ID: Kristin Stephens, female   DOB: Nov 17, 1985, 28 y.o.   MRN: [id number] [facility] [id number] Progress Note Kristin Stephens MRN: [id number] DOB: [birth year] Age: 28 y.o.  Date: 03/09/2014 Start Time: 9:02 AM End Time: 9:22 AM  Chief Complaint: Chief Complaint  Patient presents with  . Depression  . ADHD  . Manic Behavior  . Follow-up   Subjective: I've been doing well "  This patient is a 28 year old single white female who lives with her mother and her 26 and a half-year-old daughter. She still dating the father of her baby. She is a Charity fundraiser at [employer].  The patient states that she initially got depressed around age 52. She saw Peggy for counseling for a long time. Her teen years she double her depression by using alcohol marijuana and prescription pills she was also getting into self-mutilation. She was never psychiatrically hospitalized  The patient did well while she was pregnant with her daughter at age 65. However shortly after giving birth she got extremely depressed, she was unable to care for herself or the baby and had absolutely no interest in anything. She also began developing manic symptoms at times when she was very hyperactive "high", and didn't sleep or eat and was hyperalert. The physician had diagnosed her as being bipolar and she  was placed on Lamictal.  The patient also has a history of ADHD that has been present since childhood. Adderall has been effective for this. The patient returns after 6 weeks. Last time she was more depressed and we increased her Celexa. She steadily feeling better and her mood is improved. She's focusing well and doing well in school. She recently had some rectal hemorrhoids banded but other than that her health has been stable.  The patient returns after 3 months. She's doing well in maintaining excellent grades at school. Her mood is been stable and she's not had any episodes of depression or mania. She is sleeping well and her focus has been good Current psychiatric medication Lamictal 200 mg daily Celexa 40 mg daily Adderall xr 30 mg every morning and Adderall 10 mg after school Xanax  0.5 mg each bedtime  Past psychiatric history Patient has been seeing in this office since April 2012 by Dr Adele Schilder however she has been seeing therapist in this office for more than 10 years.  Patient endorse history of mood swing, depression, anger and poor attention and concentration.  Patient also endorse history of self abusive behavior and endorse cutting herself with a razor blade when her grandmother was dying.  In the past she was prescribed Wellbutrin but she was admitted on medical for the she never took the Wellbutrin.  Patient denies any history of suicidal attempt or any inpatient psychiatric treatment.  She had history of burst of  energy with excessive shopping, cleaning, road rage and impulse eating.   Psychosocial history Patient was raised by her mother and grandmother when her father left when she was only 89 years old.  Patient told her father was never in her life and she was very close to her grandmother.  Patient has a lot of resentment and anger towards her father.  Patient grandmother died when she was only 41 years old.  Patient has one daughter.  Patient has a very good relationship with  her boyfriend whose been very supportive.  Family history family history includes ADD / ADHD in her brother, brother, and sister; Alcohol abuse in her father; Anxiety disorder in her brother; Bipolar disorder in her father and paternal grandmother; Dementia in her maternal grandmother and paternal grandmother; Depression in her mother; Emphysema in her paternal grandmother; Heart disease in her maternal grandmother and paternal grandmother; Hyperlipidemia in her mother; Hypertension in her mother; Personality disorder in her father; Physical abuse in her mother; Prostate cancer in her maternal grandfather; Seizures in her daughter; Sexual abuse in her mother; Stroke in her maternal grandfather. There is no history of Drug abuse, OCD, Paranoid behavior, or Schizophrenia.  Alcohol and substance use history Patient endorse history of using marijuana and drugs and alcohol in her teens when she was self-medicating for her depression and anxiety.  However she began drinking heavily again in the last several months, particularly on weekends. She quit 90 days ago and is attending AA.  Patient denies any history of DWI, tremors or blackout.  Education and work history Patient has GED and currently she is enrolled in [employer]  She is hoping to finish her schooling in 2015.  Patient has a history of attention and concentration problem which was formally tested by psychologist in this office.    Medical history Patient has history of headache, GERD, allergic rhinitis, hypertension, irritable bowel syndrome and aortic Valve repaired.    Mental status examination Patient is well groomed and casually dressed. She is calm and cooperative.  She described her mood is good and her affect is bright  She denies any active or passive suicidal thoughts or homicidal thoughts. She denies thoughts of self-harm Her speech is  clear and coherent.  Her thought process is logical linear and goal-directed.  There no psychotic symptoms  present at this time.  Her attention and concentration is good  She's oriented x3.  Her insight judgment and impulse control is okay.  Lab Results:  No results found for this or any previous visit (from the past 8736 hour(s)).Family doctor draws these  Assessment Axis I bipolar 1 disorder, history of postpartum depression, history of ADHD Axis II deferred Axis III see medical history Axis IV moderate Axis V 60-65  Plan: I took her vitals.  I reviewed CC, tobacco/med/surg Hx, meds effects/ side effects, problem list, therapies and responses as well as current situation/symptoms discussed options. She will continue her current effective medications and return in 3 months.  See orders and pt instructions for more details.  MEDICATIONS this encounter: Meds ordered this encounter  Medications  . lamoTRIgine (LAMICTAL) 200 MG tablet    Sig: Take 1 tablet (200 mg total) by mouth at bedtime.    Dispense:  30 tablet    Refill:  2  . citalopram (CELEXA) 40 MG tablet    Sig: Take one and one half tablets per day    Dispense:  45 tablet    Refill:  2  . ALPRAZolam (  XANAX) 1 MG tablet    Sig: Take 1 tablet (1 mg total) by mouth at bedtime.    Dispense:  30 tablet    Refill:  2  . amphetamine-dextroamphetamine (ADDERALL XR) 30 MG 24 hr capsule    Sig: Take 1 capsule (30 mg total) by mouth daily.    Dispense:  30 capsule    Refill:  0    Do not fill before 04/06/14  . amphetamine-dextroamphetamine (ADDERALL XR) 30 MG 24 hr capsule    Sig: Take 1 capsule (30 mg total) by mouth daily.    Dispense:  30 capsule    Refill:  0    Do not fill before 05/07/14  . amphetamine-dextroamphetamine (ADDERALL) 10 MG tablet    Sig: Take one daily at 3 pm    Dispense:  30 tablet    Refill:  0    Do not fill before 04/06/14  . amphetamine-dextroamphetamine (ADDERALL) 10 MG tablet    Sig: Take daily at 3pm    Dispense:  30 tablet    Refill:  0    Do not fill before 05/07/14    Medical Decision  Making Problem Points:  Established problem, stable/improving (1), Review of last therapy session (1) and Review of psycho-social stressors (1) Data Points:  Review or order clinical lab tests (1) Review of medication regiment & side effects (2)  I certify that outpatient services furnished can reasonably be expected to improve the patient's condition.   Levonne Spiller, MD

## 2014-03-27 ENCOUNTER — Ambulatory Visit (INDEPENDENT_AMBULATORY_CARE_PROVIDER_SITE_OTHER): Payer: Medicaid Other | Admitting: Nurse Practitioner

## 2014-03-27 ENCOUNTER — Encounter: Payer: Self-pay | Admitting: Nurse Practitioner

## 2014-03-27 VITALS — BP 122/80 | Ht 65.0 in | Wt 216.8 lb

## 2014-03-27 DIAGNOSIS — M797 Fibromyalgia: Secondary | ICD-10-CM

## 2014-03-27 DIAGNOSIS — R21 Rash and other nonspecific skin eruption: Secondary | ICD-10-CM

## 2014-03-27 MED ORDER — HYDROCODONE-ACETAMINOPHEN 7.5-325 MG PO TABS: ORAL_TABLET | ORAL | Status: DC

## 2014-03-27 MED ORDER — VALACYCLOVIR HCL 1 G PO TABS: 1000.0000 mg | ORAL_TABLET | Freq: Three times a day (TID) | ORAL | Status: DC

## 2014-03-27 MED ORDER — GABAPENTIN 300 MG PO CAPS: ORAL_CAPSULE | ORAL | Status: DC

## 2014-03-27 NOTE — Patient Instructions (Addendum)
Gabapentin 300 two in the morning; two in the afternoon and three at night

## 2014-03-28 ENCOUNTER — Ambulatory Visit: Payer: Medicaid Other

## 2014-03-29 ENCOUNTER — Ambulatory Visit (HOSPITAL_COMMUNITY): Payer: Self-pay | Admitting: Psychiatry

## 2014-03-29 ENCOUNTER — Ambulatory Visit (INDEPENDENT_AMBULATORY_CARE_PROVIDER_SITE_OTHER): Payer: Medicaid Other

## 2014-03-29 DIAGNOSIS — Z30013 Encounter for initial prescription of injectable contraceptive: Secondary | ICD-10-CM

## 2014-03-29 DIAGNOSIS — Z308 Encounter for other contraceptive management: Secondary | ICD-10-CM

## 2014-03-29 MED ORDER — MEDROXYPROGESTERONE ACETATE 150 MG/ML IM SUSP
150.0000 mg | Freq: Once | INTRAMUSCULAR | Status: AC
  Administered 2014-03-29: 150 mg via INTRAMUSCULAR

## 2014-03-30 ENCOUNTER — Encounter: Payer: Self-pay | Admitting: Nurse Practitioner

## 2014-03-30 NOTE — Progress Notes (Signed)
Subjective:  Presents for complaints of possible shingles. Has a rash on the right lower back/upper buttock area with mild tenderness. No fever. Had shingles in the same area in the past. Also asking to increase her Neurontin dose for her fibromyalgia symptoms.  Objective:   BP 122/80 mmHg  Ht 5\' 5"  (1.651 m)  Wt 216 lb 12.8 oz (98.34 kg)  BMI 36.08 kg/m2 NAD. Alert, oriented. Several small discrete hypopigmented mostly flat areas noted in a cluster just above the buttock. Nonerythematous. Nontender to palpation.  Assessment:  Problem List Items Addressed This Visit      Musculoskeletal and Integument   Fibromyalgia (Chronic)    Other Visit Diagnoses    Rash and nonspecific skin eruption    -  Primary     Possible early zoster  Plan:  Meds ordered this encounter  Medications  . valACYclovir (VALTREX) 1000 MG tablet    Sig: Take 1 tablet (1,000 mg total) by mouth 3 (three) times daily.    Dispense:  21 tablet    Refill:  0    Order Specific Question:  Supervising Provider    Answer:  Mikey Kirschner [[id number]]  . HYDROcodone-acetaminophen (NORCO) 7.5-325 MG per tablet    Sig: One po qd prn pain    Dispense:  20 tablet    Refill:  0    Order Specific Question:  Supervising Provider    Answer:  Mikey Kirschner [[id number]]  . gabapentin (NEURONTIN) 300 MG capsule    Sig: Two po in the morning; two po in the afternoon; three po in the evening    Dispense:  210 capsule    Refill:  5    Order Specific Question:  Supervising Provider    Answer:  Mikey Kirschner [[id number]]   Reviewed signs of zoster, given prescription for Valtrex to start if needed. Added an extra dose of Neurontin 300 mg to her nighttime dose. Due to increased pain from fibromyalgia, increased strength of hydrocodone to 7.5 mg to use very sparingly. Return if symptoms worsen or fail to improve. Otherwise routine follow-up.

## 2014-04-03 ENCOUNTER — Telehealth: Payer: Self-pay | Admitting: Family Medicine

## 2014-04-03 NOTE — Telephone Encounter (Signed)
Office note states to call back if symptoms not better. Pt aware that she was suppose to call back to request referral if not better.  numbness in hands not any better and would like to go ahead with referral to specialist in [place].

## 2014-04-03 NOTE — Telephone Encounter (Signed)
Pt is supposed to have a referral for the numbness in her hand  She states this was stated in her last appointment   Please do if she was supposed to  Last seen by [facility] 12/28

## 2014-04-04 NOTE — Telephone Encounter (Signed)
Will refer to hand specialist as requested.

## 2014-04-05 NOTE — Telephone Encounter (Signed)
Pt aware we are doing referral.

## 2014-04-07 ENCOUNTER — Ambulatory Visit (HOSPITAL_COMMUNITY): Payer: Self-pay | Admitting: Psychiatry

## 2014-04-07 ENCOUNTER — Ambulatory Visit (INDEPENDENT_AMBULATORY_CARE_PROVIDER_SITE_OTHER): Payer: MEDICAID | Admitting: Psychiatry

## 2014-04-07 DIAGNOSIS — F3130 Bipolar disorder, current episode depressed, mild or moderate severity, unspecified: Secondary | ICD-10-CM
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
## 2014-04-07 NOTE — Patient Instructions (Signed)
Discussed orally 

## 2014-04-13 ENCOUNTER — Ambulatory Visit (HOSPITAL_COMMUNITY): Payer: Self-pay | Admitting: Psychiatry

## 2014-04-14 ENCOUNTER — Telehealth: Payer: Self-pay | Admitting: Family Medicine

## 2014-04-14 ENCOUNTER — Other Ambulatory Visit: Payer: Self-pay | Admitting: Nurse Practitioner

## 2014-04-14 DIAGNOSIS — G5621 Lesion of ulnar nerve, right upper limb: Secondary | ICD-10-CM

## 2014-04-14 NOTE — Telephone Encounter (Signed)
Patient left message on my voice mail to ask status of referral that [facility] recommended.  There is no referral in my "Q" to do for patient and I didn't see anything in most recent office visit 03/27/14 about a referral.  It is mentioned in a phone message from 04/03/14.  If referral is needed, please initiate in system so that I may process.

## 2014-04-27 ENCOUNTER — Encounter: Payer: Self-pay | Admitting: Family Medicine

## 2014-04-27 ENCOUNTER — Encounter: Payer: Self-pay | Admitting: Nurse Practitioner

## 2014-04-27 ENCOUNTER — Ambulatory Visit (INDEPENDENT_AMBULATORY_CARE_PROVIDER_SITE_OTHER): Payer: Medicaid Other | Admitting: Nurse Practitioner

## 2014-04-27 VITALS — BP 128/84 | Ht 65.0 in | Wt 215.2 lb

## 2014-04-27 DIAGNOSIS — M25512 Pain in left shoulder: Secondary | ICD-10-CM

## 2014-04-27 MED ORDER — HYDROCODONE-ACETAMINOPHEN 7.5-325 MG PO TABS: ORAL_TABLET | ORAL | Status: DC

## 2014-04-27 MED ORDER — CYCLOBENZAPRINE HCL 10 MG PO TABS: 10.0000 mg | ORAL_TABLET | Freq: Every day | ORAL | Status: DC

## 2014-04-30 ENCOUNTER — Encounter: Payer: Self-pay | Admitting: Nurse Practitioner

## 2014-04-30 NOTE — Progress Notes (Signed)
Subjective:  Presents for left shoulder pain. Needs referral to her specialist. Was last seen fall 2015. Diagnosed with several issues including tendonitis.   Objective:   BP 128/84 mmHg  Ht 5\' 5"  (1.651 m)  Wt 215 lb 3.2 oz (97.614 kg)  BMI 35.81 kg/m2 NAD. Alert, oriented. Limited ROM left shoulder. Cannot rotate above shoulder height.   Assessment: Left shoulder pain - Plan: Ambulatory referral to Orthopedic Surgery  Plan:  Meds ordered this encounter  Medications  . cyclobenzaprine (FLEXERIL) 10 MG tablet    Sig: Take 1 tablet (10 mg total) by mouth at bedtime. Prn muscle spasms    Dispense:  30 tablet    Refill:  0    Order Specific Question:  Supervising Provider    Answer:  Mikey Kirschner [[id number]]  . HYDROcodone-acetaminophen (NORCO) 7.5-325 MG per tablet    Sig: One po qd prn pain    Dispense:  20 tablet    Refill:  0    Order Specific Question:  Supervising Provider    Answer:  Maggie Font   Refer back to Dr. Graciela Husbands at [facility] for evaluation. Use hydrocodone sparingly.

## 2014-05-01 ENCOUNTER — Ambulatory Visit (HOSPITAL_COMMUNITY): Payer: Self-pay | Admitting: Psychiatry

## 2014-05-04 ENCOUNTER — Telehealth: Payer: Self-pay | Admitting: Nurse Practitioner

## 2014-05-04 ENCOUNTER — Other Ambulatory Visit (HOSPITAL_COMMUNITY): Payer: Self-pay | Admitting: Psychiatry

## 2014-05-04 ENCOUNTER — Telehealth (HOSPITAL_COMMUNITY): Payer: Self-pay | Admitting: *Deleted

## 2014-05-04 NOTE — Telephone Encounter (Signed)
noted 

## 2014-05-04 NOTE — Telephone Encounter (Signed)
Spoke with patient's mom. She is on her way to [place] from [place]. She said her son is on his way over. Told her to call Dr. Alan Ripper office and to not let Telisha be alone. If they cannot get in touch with Dr. Harrington Challenger, go to the ER. Mom verbalized understanding.

## 2014-05-04 NOTE — Telephone Encounter (Signed)
Pt called stating she would like for Dr. Harrington Challenger to call her back. Per pt, she is having problems with her depression and thinks she need to talk to Dr. Harrington Challenger about adjusting her medications. Per pt, she's crying a lot, keeps herself isolated and sleeps all the time. Per pt she is not suicidal but do have some thoughts of it but she's not going to do anything. Per pt, she would like for Dr. Harrington Challenger to call her. Informed pt of her appt with Peggy. Pt verbalized awareness. Pt number is [phone].

## 2014-05-04 NOTE — Telephone Encounter (Signed)
Pt upset about worsening of uncle's cancer. Doesn't want to add more medication. She will call back early next week to let me know how she is doing

## 2014-05-04 NOTE — Telephone Encounter (Signed)
pts mom is calling worried about her daughter, her son Kristin Stephens is on his  Way to the house now to be with her. She has been suicidal before but not  Sure if this is the issue at this point.   They would like some advise or a phone call when you get a chance to do so  Having melt down is how mom describes it

## 2014-05-05 ENCOUNTER — Ambulatory Visit (HOSPITAL_COMMUNITY)
Admission: RE | Admit: 2014-05-05 | Discharge: 2014-05-05 | Disposition: A | Discharge status: Home / Self Care | Payer: MEDICAID | Attending: Psychiatry | Admitting: Psychiatry

## 2014-05-05 NOTE — [facility] Counselor (Addendum)
Pt's contact info: Home [phone] [address] [place], [place]

## 2014-05-05 NOTE — BH Assessment (Signed)
Tele Assessment Note   Kristin Stephens is an 29 y.o. female. Pt presents voluntarily to [facility] for evaluation accompanied by her mother, Kristin Stephens. Per chart review, pt sees Dr Levonne Spiller and therapist Maurice Small at [facility]. Pt denies SI and HI. She denies [facility] and no delusions noted. Pt says, "I'm unable when it comes to my depression". She endorses following depressive symptoms: crying, isolating bx, loss of interest in usual pleasures, guilt, worthlessness, and fatigue. She endorses moderate anxiety. Pt sts she used to cut and hasn't cut in approx 7 years. She sts she lives with her mom and her 4 yo daughter Kristin Stephens. Current stressors include pt's uncle being very ill, a breakup with pt's daughter's dad and her mom is engaged to be married. Pt reports her mood as "lost and broken. Not myself." She says, "I want to handle my bipolar better". Pt mentions several times that she wants to be a good mother to her daughter and doesn't want to harm her daughter emotionally by pt not handling her mental illness as well as she might. Mom reports pt was very upset yesterday and asked Mom to come back from her weekend trip to [place]. Mom says she returned to their house and pt's brother also came to check on pt. Pt denies she was suicidal yesterday.  Writer ran pt by Catalina Pizza NP who agrees pt is a good candidate for [facility]. Writer called and left voicemail re: enrolling pt in [facility]. Pt signed MSE decline form.   Axis I: Bipolar II Disorder Axis II: Deferred Axis III:  Past Medical History  Diagnosis Date  . Esophageal reflux   . IBS (irritable bowel syndrome)   . Anxiety   . Depression   . Bipolar affective disorder   . TMJ syndrome   . Asthma     daily and prn inhalers  . Deviated nasal septum 05/2012  . Nasal turbinate hypertrophy 05/2012    bilateral  . Dental crowns present   . Eczema     right leg  . Migraine headache   . Hypertension   . ADHD (attention deficit  hyperactivity disorder)   . Fibromyalgia    Axis IV: other psychosocial or environmental problems and problems related to social environment Axis V: 51-60 moderate symptoms  Past Medical History:  Past Medical History  Diagnosis Date  . Esophageal reflux   . IBS (irritable bowel syndrome)   . Anxiety   . Depression   . Bipolar affective disorder   . TMJ syndrome   . Asthma     daily and prn inhalers  . Deviated nasal septum 05/2012  . Nasal turbinate hypertrophy 05/2012    bilateral  . Dental crowns present   . Eczema     right leg  . Migraine headache   . Hypertension   . ADHD (attention deficit hyperactivity disorder)   . Fibromyalgia     Past Surgical History  Procedure Laterality Date  . Asd and vsd repair  1989  . Coarctation of aorta repair  1989  . Cesarean section  04/12/2009  . Esophagogastroduodenoscopy  04/04/2008      Normal esophagus/Mild patchy erythema in the antrum/ Normal duodenal bulb, normal small bowel biopsy  . Flexible sigmoidoscopy  04/04/2008      Small internal hemorrhoids (poor bowel prep)  . Cardiac catheterization  06/18/2006  . Nasal septoplasty w/ turbinoplasty Bilateral 05/31/2012    Procedure: NASAL SEPTOPLASTY WITH TURBINATE REDUCTION;  Surgeon: Evelena Peat  Cassie Freer, MD;  Location: [facility];  Service: ENT;  Laterality: Bilateral;  . Colonoscopy N/A 05/20/2013    Procedure: COLONOSCOPY;  Surgeon: Danie Binder, MD;  [facility];  Service: Endoscopy;  Laterality: N/A;  115-moved to [place] notified pt  . Hemorrhoid banding N/A 05/20/2013    Procedure: HEMORRHOID BANDING;  Surgeon: Danie Binder, MD;  [facility];  Service: Endoscopy;  Laterality: N/A;    Family History:  Family History  Problem Relation Age of Onset  . Hyperlipidemia Mother   . Hypertension Mother   . Depression Mother   . Physical abuse Mother   . Sexual abuse Mother     possibly  . Emphysema Paternal Grandmother   . Heart disease  Paternal Grandmother   . Bipolar disorder Paternal Grandmother   . Dementia Paternal Grandmother   . Heart disease Maternal Grandmother   . Dementia Maternal Grandmother   . Stroke Maternal Grandfather   . Prostate cancer Maternal Grandfather   . Personality disorder Father   . Bipolar disorder Father   . Alcohol abuse Father   . ADD / ADHD Sister   . Anxiety disorder Brother   . ADD / ADHD Brother   . ADD / ADHD Brother   . Seizures Daughter   . Drug abuse Neg Hx   . OCD Neg Hx   . Paranoid behavior Neg Hx   . Schizophrenia Neg Hx     Social History:  reports that she has been smoking Cigarettes.  She has a 5 pack-year smoking history. She uses smokeless tobacco. She reports that she does not drink alcohol or use illicit drugs.  Additional Social History:  Alcohol / Drug Use Pain Medications: pt denies abuse Prescriptions: pt denies abuse Over the Counter: pt denies abuse History of alcohol / drug use?: Yes Longest period of sobriety (when/how long): 18 mos sobriety now Substance #1 Name of Substance 1: etoh 1 - Age of First Use: teenager 1 - Amount (size/oz): varied 1 - Last Use / Amount: pt sober for past 18 mos Substance #2 Name of Substance 2: THC 2 - Last Use / Amount: as a teenager  CIWA:   COWS:    PATIENT STRENGTHS: (choose at least two) Average or above average intelligence Communication skills  Allergies:  Allergies  Allergen Reactions  . Sulfonamide Derivatives Swelling    SWELLING OF EYES WITH OPHTHALMIC SULFA  . Coconut Flavor Rash    Home Medications:  (Not in a hospital admission)  OB/GYN Status:  No LMP recorded. Patient has had an injection.  General Assessment Data Location of Assessment: [facility] Is this a Tele or Face-to-Face Assessment?: Face-to-Face Is this an Initial Assessment or a Re-assessment for this encounter?: Initial Assessment Living Arrangements: Other (Comment), Parent, Children (mom, 45 yo daughter) Can  pt return to current living arrangement?: Yes Admission Status: Voluntary Is patient capable of signing voluntary admission?: Yes Transfer from: Home Referral Source: Self/Family/Friend  Medical Screening Exam ([place]) Medical Exam completed: No Reason for MSE not completed:  (pt signed declined form)  [place] Living Arrangements: Other (Comment), Parent, Children (mom, 53 yo daughter) Name of Psychiatrist: Dr Levonne Spiller Name of Therapist: Maurice Small  Education Status Is patient currently in school?: No Highest grade of school patient has completed: pt received GED  Risk to self with the past 6 months Suicidal Ideation: No Suicidal Intent: No Is patient at risk  for suicide?: No Suicidal Plan?: No Access to Means: No What has been your use of drugs/alcohol within the last 12 months?: none Previous Attempts/Gestures: No How many times?: 0 Other Self Harm Risks: none Triggers for Past Attempts:  (n/a) Intentional Self Injurious Behavior: Cutting Comment - Self Injurious Behavior: pt has hx of cutting - last cut approx 7 yrs ago Family Suicide History: No (paternal side hx of undiagnosed MI) Recent stressful life event(s): Loss (Comment), Other (Comment) (uncle is dying, breakup with daughter's dad, mom engaged) Persecutory voices/beliefs?: No Depression: Yes Depression Symptoms: Feeling worthless/self pity, Loss of interest in usual pleasures, Tearfulness, Isolating, Fatigue, Guilt Substance abuse history and/or treatment for substance abuse?: Yes Suicide prevention information given to non-admitted patients: Not applicable  Risk to Others within the past 6 months Homicidal Ideation: No Thoughts of Harm to Others: No Current Homicidal Intent: No Current Homicidal Plan: No Access to Homicidal Means: No Identified Victim: none History of harm to others?: No Assessment of Violence: None Noted Violent Behavior Description: pt calm - denies hx  violence Does patient have access to weapons?: No Criminal Charges Pending?: No Does patient have a court date: No  Psychosis Hallucinations: None noted Delusions: None noted  Mental Status Report Appear/Hygiene: Unremarkable, Other (Comment) (in street clothes) Eye Contact: Fair Motor Activity: Freedom of movement, Restlessness Speech: Aggressive Level of Consciousness: Alert, Crying Mood: Depressed, Anxious, Sad, Anhedonia Affect: Appropriate to circumstance, Sad, Depressed Anxiety Level: Moderate Thought Processes: Coherent, Relevant Judgement: Unimpaired Orientation: Person, Place, Time, Situation Obsessive Compulsive Thoughts/Behaviors: None  Cognitive Functioning Concentration: Normal Memory: Recent Intact, Remote Intact IQ: Average Insight: Fair Impulse Control: Good Appetite: Fair Sleep: No Change Total Hours of Sleep: 12 Vegetative Symptoms: None  ADLScreening [facility] Assessment Services) Patient's cognitive ability adequate to safely complete daily activities?: Yes Patient able to express need for assistance with ADLs?: Yes Independently performs ADLs?: Yes (appropriate for developmental age)  Prior Inpatient Therapy Prior Inpatient Therapy: No Prior Therapy Dates: na Prior Therapy Facilty/Provider(s): na Reason for Treatment: na  Prior Outpatient Therapy Prior Outpatient Therapy: Yes Prior Therapy Dates: currently Prior Therapy Facilty/Provider(s): Dr Harrington Challenger & Maurice Small Reason for Treatment: bipolar d/o, med management  ADL Screening (condition at time of admission) Patient's cognitive ability adequate to safely complete daily activities?: Yes Is the patient deaf or have difficulty hearing?: No Does the patient have difficulty seeing, even when wearing glasses/contacts?: No Does the patient have difficulty concentrating, remembering, or making decisions?: No Patient able to express need for assistance with ADLs?: Yes Does the patient have difficulty  dressing or bathing?: No Independently performs ADLs?: Yes (appropriate for developmental age) Does the patient have difficulty walking or climbing stairs?: No Weakness of Legs: None Weakness of Arms/Hands: None  Home Assistive Devices/Equipment Home Assistive Devices/Equipment: None    Abuse/Neglect Assessment (Assessment to be complete while patient is alone) Physical Abuse: Denies Verbal Abuse: Denies Sexual Abuse: Denies Exploitation of patient/patient's resources: Denies Self-Neglect: Denies     Regulatory affairs officer (For Healthcare) Does patient have an advance directive?: No Would patient like information on creating an advanced directive?: No - patient declined information    Additional Information 1:1 In Past 12 Months?: No CIRT Risk: No Elopement Risk: No Does patient have medical clearance?: No     Disposition:  Disposition Initial Assessment Completed for this Encounter: Yes Disposition of Patient: Outpatient treatment Type of outpatient treatment: Psych Intensive Outpatient (conrad withrow NP recommends [facility])  Zelphia Glover P 05/05/2014 2:18 PM

## 2014-05-05 NOTE — [facility] Counselor (Signed)
Writer left voicemail for [employer] re: pt's Engineer, site asked Velva Harman to contact pt at her earliest convenience.  Arnold Long, [place] Assessment Counselor

## 2014-05-15 ENCOUNTER — Ambulatory Visit (HOSPITAL_COMMUNITY): Payer: Self-pay | Admitting: Psychiatry

## 2014-05-16 ENCOUNTER — Encounter (HOSPITAL_COMMUNITY): Payer: Self-pay | Admitting: Psychiatry

## 2014-05-16 ENCOUNTER — Other Ambulatory Visit (HOSPITAL_COMMUNITY): Payer: Medicaid Other | Attending: Psychiatry | Admitting: Psychiatry

## 2014-05-16 DIAGNOSIS — K219 Gastro-esophageal reflux disease without esophagitis: Secondary | ICD-10-CM | POA: Diagnosis not present

## 2014-05-16 DIAGNOSIS — F3181 Bipolar II disorder: Secondary | ICD-10-CM | POA: Insufficient documentation

## 2014-05-16 DIAGNOSIS — Z87891 Personal history of nicotine dependence: Secondary | ICD-10-CM | POA: Diagnosis not present

## 2014-05-16 DIAGNOSIS — F900 Attention-deficit hyperactivity disorder, predominantly inattentive type: Secondary | ICD-10-CM | POA: Insufficient documentation

## 2014-05-16 DIAGNOSIS — J45909 Unspecified asthma, uncomplicated: Secondary | ICD-10-CM | POA: Diagnosis not present

## 2014-05-16 DIAGNOSIS — F419 Anxiety disorder, unspecified: Secondary | ICD-10-CM | POA: Insufficient documentation

## 2014-05-16 DIAGNOSIS — I10 Essential (primary) hypertension: Secondary | ICD-10-CM | POA: Insufficient documentation

## 2014-05-16 DIAGNOSIS — F331 Major depressive disorder, recurrent, moderate: Secondary | ICD-10-CM | POA: Diagnosis not present

## 2014-05-16 DIAGNOSIS — Z823 Family history of stroke: Secondary | ICD-10-CM | POA: Diagnosis not present

## 2014-05-16 DIAGNOSIS — M797 Fibromyalgia: Secondary | ICD-10-CM | POA: Diagnosis not present

## 2014-05-16 DIAGNOSIS — K589 Irritable bowel syndrome without diarrhea: Secondary | ICD-10-CM | POA: Diagnosis not present

## 2014-05-16 NOTE — Progress Notes (Signed)
Psychiatric Assessment Adult  Patient Identification:  Kristin Stephens Date of Evaluation:  05/16/2014 Chief Complaint: depressed with a history of bipolar 2 disorder, she says History of Chief Complaint:  Kristin Stephens says she has been diagnosed with bipolar disorder and has hypomanic episodes but mostly depression.  She takes citalopram and lamotrigine but still has depression.  She says she isolates when depressed, sleeping all the time, not going out, feeling sad, losing interest in usual activities, avoiding time with her daughter, feeling guilty, crying.  She had to drop out of community college because she could not focus.  She is irritable with everyone around her.  She wonders if different medications might help and will be more specific with her doctor.  She has grief issues.  Her uncle has been diagnosed with thyroid cancer and has been given months to live.  He has been a father to her as her biologic father absented himself from her life and said he had no children.  She broke up with the father of her 60 year old last month and misses that relationship but knows it will not work.  Her biologic father died 1 year ago and she regrets they never had a relationship but realizes that there was nothing she could do to make it better.  She wants to be able to cope with her situation better as she realizes it will not go away and she cannot rely on medications to make things as well as she wants them to be.  HPI Review of Systems Physical Exam  Depressive Symptoms: depressed mood, anhedonia, hypersomnia, fatigue, feelings of worthlessness/guilt, difficulty concentrating, impaired memory,  (Hypo) Manic Symptoms:   Elevated Mood:  Negative Irritable Mood:  Yes Grandiosity:  Negative Distractibility:  Yes Labiality of Mood:  Yes Delusions:  Negative Hallucinations:  Negative Impulsivity:  Negative Sexually Inappropriate Behavior:  Negative Financial Extravagance:  Negative Flight of  Ideas:  Negative  Anxiety Symptoms: Excessive Worry:  Negative Panic Symptoms:  Negative Agoraphobia:  Negative Obsessive Compulsive: Negative  Symptoms: None, Specific Phobias:  Negative Social Anxiety:  Negative  Psychotic Symptoms:  Hallucinations: Negative None Delusions:  Negative Paranoia:  Negative   Ideas of Reference:  Negative  PTSD Symptoms: Ever had a traumatic exposure:  Negative Had a traumatic exposure in the last month:  Negative Re-experiencing: Negative None Hypervigilance:  Negative Hyperarousal: Negative None Avoidance: Negative None  Traumatic Brain Injury: Negative na  Past Psychiatric History: Diagnosis: bipolar 2 disorder ADHD, inattentive  Hospitalizations: none  Outpatient Care: sees psychiatrist and therapist  Substance Abuse Care: 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 Esophageal reflux   . IBS (irritable bowel syndrome)   . Anxiety   . Depression   . Bipolar affective disorder   . TMJ syndrome   . Asthma     daily and prn inhalers  . Deviated nasal septum 05/2012  . Nasal turbinate hypertrophy 05/2012    bilateral  . Dental crowns present   . Eczema     right leg  . Migraine headache   . Hypertension   . ADHD (attention deficit hyperactivity disorder)   . 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 Sulfonamide Derivatives Swelling    SWELLING OF EYES WITH OPHTHALMIC SULFA  . Coconut Flavor Rash   Current Medications:  Current Outpatient Prescriptions  Medication Sig Dispense Refill  . acetaminophen (TYLENOL) 500 MG  tablet Take 500 mg by mouth every 6 (six) hours as needed for moderate pain or headache.     . albuterol (PROVENTIL HFA;VENTOLIN HFA) 108 (90 BASE) MCG/ACT inhaler Inhale 1-2 puffs into the lungs every 6 (six) hours as needed for wheezing. 1 Inhaler 3  . ALPRAZolam (XANAX) 1 MG tablet Take 1 tablet (1 mg total) by mouth at bedtime. 30 tablet 2  . amphetamine-dextroamphetamine (ADDERALL XR) 30 MG 24 hr capsule Take 1 capsule (30 mg total) by mouth daily. 30 capsule 0  . amphetamine-dextroamphetamine (ADDERALL XR) 30 MG 24 hr capsule Take 1 capsule (30 mg total) by mouth daily. 30 capsule 0  . amphetamine-dextroamphetamine (ADDERALL) 10 MG tablet Take daily at 3 pm 30 tablet 0  . amphetamine-dextroamphetamine (ADDERALL) 10 MG tablet Take one daily at 3 pm 30 tablet 0  . amphetamine-dextroamphetamine (ADDERALL) 10 MG tablet Take daily at 3pm 30 tablet 0  . beclomethasone (QVAR) 80 MCG/ACT inhaler Inhale 2 puffs into the lungs 2 (two) times daily. 1 Inhaler 3  . citalopram (CELEXA) 40 MG tablet Take one and one half tablets per day 45 tablet 2  . clobetasol cream (TEMOVATE) 6.76 % Apply 1 application topically 2 (two) times daily as needed (eczema rash).    . cyclobenzaprine (FLEXERIL) 10 MG tablet Take 1 tablet (10 mg total) by mouth at bedtime. Prn muscle spasms 30 tablet 0  . cycloSPORINE (RESTASIS) 0.05 % ophthalmic emulsion Place 1 drop into both eyes 2 (two) times daily.    Marland Kitchen DEXILANT 60 MG capsule TAKE 1 CAPSULE BY MOUTH ONCE DAILY. 31 capsule 5  . DiphenhydrAMINE HCl (BENADRYL ALLERGY PO) Take by mouth.    . fluticasone (FLONASE) 50 MCG/ACT nasal spray INSTILL 2 SPRAYS INTO EACH NOSTRIL DAILY. 16 g 6  . gabapentin (NEURONTIN) 300 MG capsule Two po in the morning; two po in the afternoon; three po in the evening 210 capsule 5  . HYDROcodone-acetaminophen (NORCO) 7.5-325 MG per tablet One po qd prn pain 20 tablet 0  . ketorolac (TORADOL) 10 MG tablet Take 1 tablet (10 mg total) by mouth every 6  (six) hours as needed. Do not use more than 5 days continuously; max dose 40 mg in 24 hrs 20 tablet 0  . lamoTRIgine (LAMICTAL) 200 MG tablet Take 1 tablet (200 mg total) by mouth at bedtime. 30 tablet 2  . loratadine (CLARITIN) 10 MG tablet Take 1 tablet (10 mg total) by mouth daily as needed for rhinitis. 30 tablet 0  . medroxyPROGESTERone (DEPO-PROVERA) 150 MG/ML injection Inject 150 mg into the muscle every 3 (three) months.    . metoCLOPramide (REGLAN) 10 MG tablet Take 1 tablet (10 mg total) by mouth 3 (three) times daily as needed for nausea (headache / nausea). 20 tablet 0  . valACYclovir (VALTREX) 1000 MG tablet Take 1 tablet (1,000 mg total) by mouth 3 (three) times daily. 21 tablet 0   No current facility-administered medications for this visit.    Previous Psychotropic Medications:  Medication Dose   as above  na  Substance Abuse History in the last 12 months: Substance Age of 1st Use Last Use Amount Specific Type  Nicotine Stopped smoking  na  na  na  Alcohol  no drinking for21 months  na  na  na  Cannabis  smoked a month ago but that was isolated  na  na  na  Opiates  none  na  na  na  Cocaine  none  na  na  na  Methamphetamines  none  na  na  na  LSD  na  na  na  na  Ecstasy  none   na  na  na  Benzodiazepines  none  na  na  na  Caffeine  none of significance  na   na  na  Inhalants  none  na  na  na  Others:                          Medical Consequences of Substance Abuse: none  Legal Consequences of Substance Abuse: none  Family Consequences of Substance Abuse: none  Blackouts:  Negative DT's:  Negative Withdrawal Symptoms:  Negative None  Social History: Current Place of Residence: [place] Place of Birth: [place] Family Members: lives with mother and daughter Marital Status:  Single Children: 1  Sons: 0  Daughters: 1 Relationships: close to mother, daughter and uncle Education:  current college Educational Problems/Performance:  ADHD Religious Beliefs/Practices: not asked History of Abuse: none Occupational Experiences; Military History:  None. Legal History: none Hobbies/Interests: none  Family History:   Family History  Problem Relation Age of Onset  . Hyperlipidemia Mother   . Hypertension Mother   . Depression Mother   . Physical abuse Mother   . Sexual abuse Mother     possibly  . Emphysema Paternal Grandmother   . Heart disease Paternal Grandmother   . Bipolar disorder Paternal Grandmother   . Dementia Paternal Grandmother   . Heart disease Maternal Grandmother   . Dementia Maternal Grandmother   . Stroke Maternal Grandfather   . Prostate cancer Maternal Grandfather   . Personality disorder Father   . Bipolar disorder Father   . Alcohol abuse Father   . ADD / ADHD Sister   . Anxiety disorder Brother   . ADD / ADHD Brother   . ADD / ADHD Brother   . Seizures Daughter   . Drug abuse Neg Hx   . OCD Neg Hx   . Paranoid behavior Neg Hx   . Schizophrenia Neg Hx     Mental Status Examination/Evaluation: Objective:  Appearance: Fairly Groomed  Eye Contact::  Good  Speech:  Clear and Coherent  Volume:  Normal  Mood:  depressed  Affect:  Appropriate  Thought Process:  Coherent and Logical  Orientation:  Full (Time, Place, and Person)  Thought Content:  Negative  Suicidal Thoughts:  No  Homicidal Thoughts:  No  Judgement:  Intact  Insight:  Fair  Psychomotor Activity:  Normal  Akathisia:  Negative  Handed:  Right  AIMS (if indicated):  0  Assets:  Communication Skills Desire for Improvement Financial Resources/Insurance Housing Leisure Time Social Support Transportation Vocational/Educational    Laboratory/X-Ray Psychological Evaluation(s)   none  none   Assessment:  Major depression, recurrent moderate.  ADHD, inattentive                  Treatment Plan/Recommendations:  Plan of Care: daily group therapy  Laboratory:  none  Psychotherapy:  group therapy   Medications: continue current medications  Routine PRN Medications:  Yes  Consultations: none  Safety Concerns:  none  Other:      Clarene Reamer, MD 2/16/20162:24 PM
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
## 2014-05-17 ENCOUNTER — Other Ambulatory Visit (HOSPITAL_COMMUNITY): Payer: Medicaid Other | Admitting: Psychiatry

## 2014-05-17 ENCOUNTER — Telehealth: Payer: Self-pay | Admitting: Nurse Practitioner

## 2014-05-17 DIAGNOSIS — F331 Major depressive disorder, recurrent, moderate: Secondary | ICD-10-CM | POA: Diagnosis not present

## 2014-05-17 DIAGNOSIS — F3181 Bipolar II disorder: Secondary | ICD-10-CM

## 2014-05-17 NOTE — Telephone Encounter (Signed)
[facility] called letting you know that the pt started with [facility] yesterday.

## 2014-05-17 NOTE — Progress Notes (Signed)
    Daily Group Progress Note  Program: IOP  Group Time: 9:00-10:30  Participation Level: Active  Behavioral Response: Appropriate  Type of Therapy:  Group Therapy  Summary of Progress: Pt. Reported that she is challenged by insomnia. Pt. Continues to process grief related to anticipating the loss of her uncle. Pt. Reported that she is 21 months sober and acknowledges need for coping skills to manage her feelings.     Group Time: 10:30-12:00  Participation Level:  Active  Behavioral Response: Appropriate  Type of Therapy: Psycho-education Group  Summary of Progress: Pt. Participated in guided meditation "magical color shower" exercise.   Nancie Neas, LPC

## 2014-05-17 NOTE — Telephone Encounter (Signed)
Noted  

## 2014-05-18 ENCOUNTER — Other Ambulatory Visit (HOSPITAL_COMMUNITY): Payer: Medicaid Other | Admitting: Psychiatry

## 2014-05-18 DIAGNOSIS — F331 Major depressive disorder, recurrent, moderate: Secondary | ICD-10-CM | POA: Diagnosis not present

## 2014-05-18 NOTE — Progress Notes (Signed)
    Daily Group Progress Note  Program: IOP  Group Time: 9:00-10:30  Participation Level: Active  Behavioral Response: Appropriate  Type of Therapy:  Group Therapy  Summary of Progress: Pt. Was very active in group. Pt shared her story of her daughter having epilepsy. She fears that her daughter will hurt herself and is very protective of her 27 year old daughter.     Group Time: 10:30-12:00  Participation Level:  Active  Behavioral Response: Appropriate  Type of Therapy: Psycho-education Group  Summary of Progress: Pt was active in group. Pt learned about the importance of self-care, self-care activities, and the importance of saying no to things that bring negativity and cause burnout. Reports getting a mani/pedi as a self-care activity.  Nancie Neas, LPC

## 2014-05-18 NOTE — Progress Notes (Signed)
    Daily Group Progress Note  Program: IOP  Group Time: 9:00-10:30  Participation Level: Active  Behavioral Response: Appropriate  Type of Therapy:  Group Therapy  Summary of Progress: Pt. Discussed history of alcohol abuse and binge eating. Pt. Discussed her desire to better understand her feelings and to learn more constructive ways of managing her emotions.      Group Time: 10:30-12:00  Participation Level:  Active  Behavioral Response: Appropriate  Type of Therapy: Psycho-education Group  Summary of Progress: Pt. Watched and discussed the role of blame versus accountability in the therapeutic process.   Nancie Neas, LPC

## 2014-05-19 ENCOUNTER — Other Ambulatory Visit (HOSPITAL_COMMUNITY): Payer: Medicaid Other | Admitting: Psychiatry

## 2014-05-19 DIAGNOSIS — F331 Major depressive disorder, recurrent, moderate: Secondary | ICD-10-CM | POA: Diagnosis not present

## 2014-05-19 DIAGNOSIS — F3181 Bipolar II disorder: Secondary | ICD-10-CM

## 2014-05-19 NOTE — Progress Notes (Signed)
    Daily Group Progress Note  Program: IOP  Group Time: 9:00-10:30  Participation Level: Active  Behavioral Response: Appropriate  Type of Therapy:  Group Therapy  Summary of Progress: Pt. Reported that she was feeling "ok" and tired because she only slept 4 hours last night. Pt. 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      Group Time: 10:30-12:00  Participation Level:  Active  Behavioral Response: Appropriate  Type of Therapy: Psycho-education Group  Summary of Progress: Pt. Participated in grief and loss facilitated by Jeanella Craze.   Nancie Neas, LPC

## 2014-05-22 ENCOUNTER — Other Ambulatory Visit (HOSPITAL_COMMUNITY): Payer: Medicaid Other | Admitting: Psychiatry

## 2014-05-22 DIAGNOSIS — F331 Major depressive disorder, recurrent, moderate: Secondary | ICD-10-CM | POA: Diagnosis not present

## 2014-05-22 DIAGNOSIS — F3181 Bipolar II disorder: Secondary | ICD-10-CM

## 2014-05-23 ENCOUNTER — Other Ambulatory Visit (HOSPITAL_COMMUNITY): Payer: Medicaid Other | Admitting: Psychiatry

## 2014-05-23 DIAGNOSIS — F3181 Bipolar II disorder: Secondary | ICD-10-CM

## 2014-05-23 DIAGNOSIS — F331 Major depressive disorder, recurrent, moderate: Secondary | ICD-10-CM | POA: Diagnosis not present

## 2014-05-24 ENCOUNTER — Other Ambulatory Visit (HOSPITAL_COMMUNITY): Payer: Medicaid Other | Admitting: Psychiatry

## 2014-05-24 DIAGNOSIS — F331 Major depressive disorder, recurrent, moderate: Secondary | ICD-10-CM | POA: Diagnosis not present

## 2014-05-25 ENCOUNTER — Other Ambulatory Visit (HOSPITAL_COMMUNITY): Payer: Medicaid Other | Admitting: Psychiatry

## 2014-05-25 ENCOUNTER — Other Ambulatory Visit: Payer: Self-pay | Admitting: Nurse Practitioner

## 2014-05-25 ENCOUNTER — Encounter: Payer: Self-pay | Admitting: Family Medicine

## 2014-05-25 ENCOUNTER — Telehealth: Payer: Self-pay | Admitting: Family Medicine

## 2014-05-25 DIAGNOSIS — F3181 Bipolar II disorder: Secondary | ICD-10-CM

## 2014-05-25 DIAGNOSIS — F331 Major depressive disorder, recurrent, moderate: Secondary | ICD-10-CM | POA: Diagnosis not present

## 2014-05-25 MED ORDER — HYDROCODONE-ACETAMINOPHEN 7.5-325 MG PO TABS: ORAL_TABLET | ORAL | Status: DC

## 2014-05-25 NOTE — Progress Notes (Signed)
    Daily Group Progress Note  Program: IOP  Group Time: 9:00-10:30  Participation Level: Active  Behavioral Response: Appropriate  Type of Therapy:  Psycho-education Group  Summary of Progress: Pt. Participated in medication group with Jiles Garter.     Group Time: 10:30-12:00  Participation Level:  Active  Behavioral Response: Appropriate  Type of Therapy: Group Therapy  Summary of Progress: Pt. Participated in discussion about developing motivation. Pt. Watched the [employer] video about motivation. Pt. Reported that she was missing her daughter.   Nancie Neas, LPC

## 2014-05-25 NOTE — Telephone Encounter (Signed)
Pt is requesting refill on HYDROcodone-acetaminophen (NORCO) 7.5-325 MG per tablet, is completely out, her appointment with Dr. Graciela Husbands for her shoulder is not until 07/03/14, she would like to see him specifically and is willing to wait, she is on a cancellation list and could be called to come in sooner, pt is willing to come in to be seen for refill if necessary, please advise

## 2014-05-25 NOTE — Telephone Encounter (Signed)
Script ready. Pt notified on voicemail.

## 2014-05-25 NOTE — Progress Notes (Signed)
    Daily Group Progress Note  Program: IOP  Group Time: 9:00-10:30  Participation Level: Active  Behavioral Response: Appropriate  Type of Therapy:  Group Therapy  Summary of Progress: Pt. Discussed grief related to anticipating the loss of her uncle and missing her daughter who is in the care of her aunt.      Group Time: 10:30-12:00  Participation Level:  Active  Behavioral Response: Appropriate  Type of Therapy: Psycho-education Group  Summary of Progress: Pt. Participated in discussion of development of healthy interpersonal and intrapersonal boundaries.   Nancie Neas, LPC

## 2014-05-25 NOTE — Progress Notes (Signed)
    Daily Group Progress Note  Program: IOP  Group Time:  9:00-10:30  Participation Level: Active  Behavioral Response: Appropriate  Type of Therapy:  Group Therapy  Summary of Progress: Pt. Was very active in group. Pt had some anxiety due to her uncle's surgery today. She stated being worried if he was going to live.      Group Time: 10:30-12:00  Participation Level:  Active  Behavioral Response: Appropriate  Type of Therapy: Psycho-education Group  Summary of Progress: Pt was active in group. Pt. watched Eilleen Kempf TED Talk and discussed the stigma around depression and anxiety. Pt. seemed passionate about sharing her story with others and helping them learn more about depression.   Nancie Neas, LPC

## 2014-05-25 NOTE — Telephone Encounter (Signed)
Will refill #20.

## 2014-05-26 ENCOUNTER — Other Ambulatory Visit (HOSPITAL_COMMUNITY): Payer: Medicaid Other | Admitting: Psychiatry

## 2014-05-26 DIAGNOSIS — F331 Major depressive disorder, recurrent, moderate: Secondary | ICD-10-CM | POA: Diagnosis not present

## 2014-05-26 DIAGNOSIS — F3181 Bipolar II disorder: Secondary | ICD-10-CM

## 2014-05-26 NOTE — Progress Notes (Signed)
    Daily Group Progress Note  Program: IOP  Group Time: 9:00-10:30  Participation Level: Active  Behavioral Response: Appropriate  Type of Therapy:  Group Therapy  Summary of Progress: Pt. Presents with bright affect, talkative, attentive. Pt. Continues to process anticipatory grief related to loss of uncle.      Group Time: 10:30-12:00  Participation Level:  Active  Behavioral Response: Appropriate  Type of Therapy: Psycho-education Group  Summary of Progress: Pt. Participated in breathing exercise and guided reflection to music about developing acceptance of self.   Nancie Neas, LPC

## 2014-05-29 ENCOUNTER — Telehealth (HOSPITAL_COMMUNITY): Payer: Self-pay | Admitting: *Deleted

## 2014-05-29 ENCOUNTER — Other Ambulatory Visit (HOSPITAL_COMMUNITY): Payer: Medicaid Other | Admitting: Psychiatry

## 2014-05-29 DIAGNOSIS — F331 Major depressive disorder, recurrent, moderate: Secondary | ICD-10-CM | POA: Diagnosis not present

## 2014-05-29 DIAGNOSIS — F3181 Bipolar II disorder: Secondary | ICD-10-CM

## 2014-05-29 NOTE — Telephone Encounter (Signed)
noted 

## 2014-05-29 NOTE — Telephone Encounter (Signed)
Pt called stating she have decided that she is going to transfer back to see Dr. Adele Schilder. Per pt she just finished her inpt treatment and just decided that she would go back to seeing Dr. Adele Schilder again.../or

## 2014-05-29 NOTE — Progress Notes (Signed)
    Daily Group Progress Note  Program: IOP  Group Time: 9:00-10:30  Participation Level: Active  Behavioral Response: Appropriate  Type of Therapy:  Group Therapy  Summary of Progress: Pt. Presented with brightened affect, talkative but needing less redirection. Pt. Is developing awareness about excessive talking. Pt. Discussed missing her daughter and fears that she will be able to communicate effectively reasons for her absence during treatment.      Group Time: 10:30-12:00  Participation Level:  Active  Behavioral Response: Appropriate  Type of Therapy: Psycho-education Group  Summary of Progress: Pt. Participated in grief and loss facilitated by Jeanella Craze.   Nancie Neas, LPC

## 2014-05-30 ENCOUNTER — Telehealth (HOSPITAL_COMMUNITY): Payer: Self-pay | Admitting: Psychiatry

## 2014-05-30 ENCOUNTER — Other Ambulatory Visit (HOSPITAL_COMMUNITY): Payer: MEDICAID

## 2014-05-31 ENCOUNTER — Other Ambulatory Visit (HOSPITAL_COMMUNITY): Payer: MEDICAID | Attending: Psychiatry | Admitting: Psychiatry

## 2014-05-31 DIAGNOSIS — K589 Irritable bowel syndrome without diarrhea: Secondary | ICD-10-CM | POA: Diagnosis not present

## 2014-05-31 DIAGNOSIS — K219 Gastro-esophageal reflux disease without esophagitis: Secondary | ICD-10-CM | POA: Insufficient documentation

## 2014-05-31 DIAGNOSIS — F419 Anxiety disorder, unspecified: Secondary | ICD-10-CM | POA: Insufficient documentation

## 2014-05-31 DIAGNOSIS — F909 Attention-deficit hyperactivity disorder, unspecified type: Secondary | ICD-10-CM | POA: Diagnosis not present

## 2014-05-31 DIAGNOSIS — F3181 Bipolar II disorder: Secondary | ICD-10-CM

## 2014-05-31 DIAGNOSIS — F319 Bipolar disorder, unspecified: Secondary | ICD-10-CM | POA: Diagnosis not present

## 2014-05-31 NOTE — Progress Notes (Signed)
Kristin Stephens is a 29 y.o. , single, unemployed, Caucasian female, who self referred; treatment for worsening depressive symptoms. Dx'd with Bipolar II and ADD. Denied SI/HI or A/V hallucinations. Other symptoms included: Sadness, ruminating thoughts, isolation, crying spells and increased sleep. Pt stated she hasn't been able to function to the point, she had to drop out of college ([employer]) this semester. "I couldn't concentrate at all." Other stressors/triggers included: 1) Unresolved grief/loss issues: Uncle diagnosed with terminal cancer five years ago and has been given six months to live. "He is like a father figure for me." Also, recent engagement with fiance' ended ~ one month ago. They have a child together. "I use to be able to call him at anytime. I want to work on the relationship but he has moved on." 2) 3 yo daughter has Epilepsy. Pt and daughter resides with patient's mother who is supportive. 3) Medical Issues: Fibromyalgia, Asthma, Migraines, Heart condition Pt denied prior psychiatric hospitalizations. Admitted to a prior suicide attempt in 2007, in which she cut her wrists. Stated she has a hx of cutting on arms and upper legs. Pt has been seeing Maurice Small, LCSW since age 45 and has been seeing Dr. Harrington Challenger since she joined the practice. Family Hx: Father (Bipolar and ETOH); Mother (Depression); Maternal Grandmother (Bipolar; hx of ECT); Brother (Depression) Pt completed MH-IOP today.  States she is trying to get use to being alone at home; since her mother is away caring for pt's dying uncle and her daughter is currently at her sisters.  Pt states that the groups were helpful; "it showed me that I'm not alone.  My brother is my rock."  Pt continues the grieve uncle's impending death.  Denies SI/HI or A/V hallucinations.  A:  D/C today.  F/U with Dr. Adele Schilder on 06-07-14 @ 3:30pm and Maurice Small, LCSW (pt to schedule).  Pt plans to f/u with [facility].  Encouraged  support groups.  R:  Pt receptive.

## 2014-05-31 NOTE — Patient Instructions (Signed)
Patient completed MH-IOP today.  Will follow up with Dr. Adele Schilder on 06-07-14 @ 3:30 pm and Maurice Small, LCSW.   Encouraged support groups.

## 2014-05-31 NOTE — Progress Notes (Signed)
  Kristin Stephens  Kristin Stephens [id number]  Admission date: 05/16/2014 Discharge date: 05/31/2014  Reason for admission: mood swings, depression  Chemical Use History: currently not drinking or using any substances she says  Family of Origin Issues: struggles with her family including her sister who is taking care of her daughter as well as grieving the likely loss of her uncle to cancer  Progress in Program Toward Treatment Goals: feels much better she says, not ready to have her daughter home but closer. Still anxious about her ability to manage her life but believes she has been understood and supported here and has been able to use coping skills more effectively  Progress (rationale): was able to listen to others and learn new ways of coping and has stayed clean and sober.    Clarene Reamer, MD 05/31/2014

## 2014-06-01 ENCOUNTER — Other Ambulatory Visit (HOSPITAL_COMMUNITY): Payer: MEDICAID

## 2014-06-01 NOTE — Progress Notes (Signed)
    Daily Group Progress Note  Program: IOP  Group Time: 9:00-10:15  Participation Level: Active  Behavioral Response: Appropriate  Type of Therapy:  Psycho-education Group  Summary of Progress: Pt. Participated in medication management group with Jiles Garter.     Group Time: 10:15-12:00  Participation Level:  Active  Behavioral Response: Appropriate  Type of Therapy: Group Therapy  Summary of Progress: Pt. Presented as talkative with bright affect. Pt. Reported her readiness for discharge. Pt. Discussed lack of understanding from her family about her mental illness, desire to be an advocate for others, continues to process difficulty of accepting that her uncle is dying.  Nancie Neas, LPC

## 2014-06-02 ENCOUNTER — Other Ambulatory Visit (HOSPITAL_COMMUNITY): Payer: MEDICAID

## 2014-06-02 NOTE — Progress Notes (Signed)
    Daily Group Progress Note  Program: IOP  Group Time: 9:00-10:30  Participation Level: Active  Behavioral Response: Appropriate  Type of Therapy:  Group Therapy  Summary of Progress: Pt. Shared reading with the group. Pt. Presented with bright affect, talkative. Pt. Shared hopefulness connected to seeing her daughter and activities with the mental health association. Pt.  Continues to grieve anticipating the death of her uncle.      Group Time: 10:30-12:00  Participation Level:  Active  Behavioral Response: Appropriate and Sharing  Type of Therapy: Psycho-education Group  Summary of Progress: Pt. Participated in discharge planning. Pt. Participated in reflective reading about control.   Nancie Neas, LPC

## 2014-06-05 ENCOUNTER — Other Ambulatory Visit (HOSPITAL_COMMUNITY): Payer: MEDICAID

## 2014-06-06 ENCOUNTER — Other Ambulatory Visit (HOSPITAL_COMMUNITY): Payer: MEDICAID

## 2014-06-07 ENCOUNTER — Other Ambulatory Visit (HOSPITAL_COMMUNITY): Payer: MEDICAID

## 2014-06-07 ENCOUNTER — Ambulatory Visit (INDEPENDENT_AMBULATORY_CARE_PROVIDER_SITE_OTHER): Payer: MEDICAID | Admitting: Psychiatry

## 2014-06-07 ENCOUNTER — Encounter (HOSPITAL_COMMUNITY): Payer: Self-pay | Admitting: Psychiatry

## 2014-06-07 VITALS — BP 134/85 | HR 112 | Ht 65.0 in | Wt 211.0 lb

## 2014-06-07 DIAGNOSIS — F909 Attention-deficit hyperactivity disorder, unspecified type: Secondary | ICD-10-CM

## 2014-06-07 DIAGNOSIS — F988 Other specified behavioral and emotional disorders with onset usually occurring in childhood and adolescence: Secondary | ICD-10-CM

## 2014-06-07 DIAGNOSIS — F3131 Bipolar disorder, current episode depressed, mild: Secondary | ICD-10-CM

## 2014-06-07 MED ORDER — AMPHETAMINE-DEXTROAMPHET ER 30 MG PO CP24: 30.0000 mg | ORAL_CAPSULE | Freq: Every day | ORAL | Status: DC

## 2014-06-07 MED ORDER — LAMOTRIGINE 200 MG PO TABS: 200.0000 mg | ORAL_TABLET | Freq: Every day | ORAL | Status: DC

## 2014-06-07 MED ORDER — FLUOXETINE HCL 20 MG PO CAPS: 20.0000 mg | ORAL_CAPSULE | Freq: Every day | ORAL | Status: DC

## 2014-06-07 MED ORDER — HYDROXYZINE PAMOATE 25 MG PO CAPS: ORAL_CAPSULE | ORAL | Status: DC

## 2014-06-07 NOTE — Progress Notes (Signed)
[place] [phone] Progress Note  Kristin Stephens [id number] 29 y.o.  06/07/2014 4:58 PM  Chief Complaint:  I am not happy with my current provider.  I like to see you back.  My medicine is not helping.  I recently finished intensive outpatient program.  History of Present Illness:  Patient is 29 year old Caucasian, single female who I have seen in the past for the veteran of depression, bipolar disorder and ADD came for her appointment.  She was seeing Dr. Harrington Challenger in [place] office.  She's been experiencing increased anxiety depression and she has not seen any improvement with changes in her medication.  She started intensive outpatient program on the 20 16th because she was feeling very depressed and lost interest in her usual activities.  She was crying and feeling guilty.  Her uncle was dying and diagnosed with thyroid cancer.  She is very close to her uncle.  She felt that she is not able to play with her 53-year-old daughter and she feels very guilty about it.  She endorsed rumination, irritability, crying spells, lack of energy and social isolation.  She is taking Lamictal, Celexa, Adderall, Xanax.  Recently her psychiatrist increased Celexa 60 mg she does not see any improvement.  She felt that her psychiatrist did not listen to her and did not make a lot of changes.  She like to try a different medication.  Patient denies any hallucination, paranoia, aggression or violence.  She denies any suicidal thoughts or homicidal thought.  She denies drinking or using any illegal substances.  Patient lives with her 70-year-old daughter.  She is trying to connect with her mother and brother again in her life.  She mentioned that the father of her daughter is no longer living together.  However she still had good relationship with him as a friend.  At this time patient does not have any side effects of medication but also feels that it is not working for her.  She is seeing Maurice Small for  counseling.  Suicidal Ideation: No Plan Formed: No Patient has means to carry out plan: No  Homicidal Ideation: No Plan Formed: No Patient has means to carry out plan: No  Medical History; Patient has history of migraine headache, GERD, allergic rhinitis, hypertension, irritable bowel syndrome and aortic valve repaired.  She has history of ASD and VSD repair, Coaraction of aorta repair, cardiac catheterization and flexible sigmoidoscopy.  Education and Work History; Patient has GED and currently she is enrolled in [employer] and is studying human services.  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  Psychosocial History; Patient was raised by her mother and her grandmother when her father left her when she was 65 years old.  Her biological father died in 06/17/2013.  She always very close to her grandmother and her uncle.  Her grandmother died when she was 47 years old.  Patient has one daughter who is 15 years old.  Even though she is separated from the father of her daughter but she still consider him a good friend.    Substance abuse history.   Patient admitted history of smoking marijuana and drugs and alcohol in her teens.  She quit drinking 2 years ago and denies any illegal substance use.  She has no history of DWI tremors, blackouts or any seizures.  Past Psychiatric History/Hospitalization(s) Patient endorse history of mood swing, depression, anger and poor attention concentration.  She is diagnosed with bipolar disorder and ADD.  She has history of self abusive behavior in the past and she remember cutting herself with a razor blade when her grandmother died.  She has not done any self abusive behavior in a while.  She was seen this Probation officer until April 2012.  After that she was seeing Dr. Harrington Challenger in [place].  In the past she had tried Wellbutrin. Anxiety: No Bipolar Disorder: Yes Depression: Yes Mania: Yes Psychosis: No Schizophrenia: No Personality Disorder:  No Hospitalization for psychiatric illness: No History of Electroconvulsive Shock Therapy: No Prior Suicide Attempts: Patient has self abusive behavior.   Review of Systems: Psychiatric: Agitation: No Hallucination: No Depressed Mood: Yes Insomnia: Yes Hypersomnia: No Altered Concentration: No Feels Worthless: Yes Grandiose Ideas: No Belief In Special Powers: No New/Increased Substance Abuse: No Compulsions: No  Neurologic: Headache: Yes Seizure: No Paresthesias: No   Musculoskeletal: Strength & Muscle Tone: within normal limits Gait & Station: normal Patient leans: N/A  Outpatient Encounter Prescriptions as of 06/07/2014  Medication Sig  . acetaminophen (TYLENOL) 500 MG tablet Take 500 mg by mouth every 6 (six) hours as needed for moderate pain or headache.   . albuterol (PROVENTIL HFA;VENTOLIN HFA) 108 (90 BASE) MCG/ACT inhaler Inhale 1-2 puffs into the lungs every 6 (six) hours as needed for wheezing.  Marland Kitchen ALPRAZolam (XANAX) 1 MG tablet Take 1 tablet (1 mg total) by mouth at bedtime.  Marland Kitchen amphetamine-dextroamphetamine (ADDERALL XR) 30 MG 24 hr capsule Take 1 capsule (30 mg total) by mouth daily.  Marland Kitchen amphetamine-dextroamphetamine (ADDERALL XR) 30 MG 24 hr capsule Take 1 capsule (30 mg total) by mouth daily.  Marland Kitchen amphetamine-dextroamphetamine (ADDERALL) 10 MG tablet Take daily at 3 pm  . amphetamine-dextroamphetamine (ADDERALL) 10 MG tablet Take one daily at 3 pm  . amphetamine-dextroamphetamine (ADDERALL) 10 MG tablet Take daily at 3pm  . beclomethasone (QVAR) 80 MCG/ACT inhaler Inhale 2 puffs into the lungs 2 (two) times daily.  . citalopram (CELEXA) 40 MG tablet Take one and one half tablets per day  . clobetasol cream (TEMOVATE) 7.32 % Apply 1 application topically 2 (two) times daily as needed (eczema rash).  . cyclobenzaprine (FLEXERIL) 10 MG tablet Take 1 tablet (10 mg total) by mouth at bedtime. Prn muscle spasms  . cycloSPORINE (RESTASIS) 0.05 % ophthalmic emulsion Place  1 drop into both eyes 2 (two) times daily.  Marland Kitchen DEXILANT 60 MG capsule TAKE 1 CAPSULE BY MOUTH ONCE DAILY.  . DiphenhydrAMINE HCl (BENADRYL ALLERGY PO) Take by mouth.  Marland Kitchen FLUoxetine (PROZAC) 20 MG capsule Take 1 capsule (20 mg total) by mouth daily.  . fluticasone (FLONASE) 50 MCG/ACT nasal spray INSTILL 2 SPRAYS INTO EACH NOSTRIL DAILY.  Marland Kitchen gabapentin (NEURONTIN) 300 MG capsule Two po in the morning; two po in the afternoon; three po in the evening  . HYDROcodone-acetaminophen (NORCO) 7.5-325 MG per tablet One po qd prn pain  . hydrOXYzine (VISTARIL) 25 MG capsule Take 1-2 capsule at bed time for insomnia  . ketorolac (TORADOL) 10 MG tablet Take 1 tablet (10 mg total) by mouth every 6 (six) hours as needed. Do not use more than 5 days continuously; max dose 40 mg in 24 hrs  . lamoTRIgine (LAMICTAL) 200 MG tablet Take 1 tablet (200 mg total) by mouth at bedtime.  Marland Kitchen loratadine (CLARITIN) 10 MG tablet Take 1 tablet (10 mg total) by mouth daily as needed for rhinitis.  . medroxyPROGESTERone (DEPO-PROVERA) 150 MG/ML injection Inject 150 mg into the muscle every 3 (three) months.  . metoCLOPramide (REGLAN) 10 MG  tablet Take 1 tablet (10 mg total) by mouth 3 (three) times daily as needed for nausea (headache / nausea).  . valACYclovir (VALTREX) 1000 MG tablet Take 1 tablet (1,000 mg total) by mouth 3 (three) times daily.  . [DISCONTINUED] amphetamine-dextroamphetamine (ADDERALL XR) 30 MG 24 hr capsule Take 1 capsule (30 mg total) by mouth daily.  . [DISCONTINUED] lamoTRIgine (LAMICTAL) 200 MG tablet Take 1 tablet (200 mg total) by mouth at bedtime.    No results found for this or any previous visit (from the past 2160 hour(s)).  Physical Exam: Consitutional ;  BP 134/85 mmHg  Pulse 112  Ht 5\' 5"  (1.651 m)  Wt 211 lb (95.709 kg)  BMI 35.11 kg/m2  Mental Status Examination;  Patient is casually dressed and groomed.  She appears anxious but cooperative.  Her speech is slow but clear and coherent.   Her thought processes logical and goal-directed.  She described her mood sad depressed and her affect is constricted.  There were no delusions, paranoia or any obsessive thoughts.  She denies any active or passive suicidal parts or homicidal thought.  Her psychomotor activity is decreased.  She has no tremors, shakes or any EPS symptoms.  Her attention concentration is fair.  Her fund of knowledge is adequate.  There were no flight of ideas or any loose association.  Her cognition is good.  She is alert and oriented 3.  Her insight judgment and impulse control is okay.   New problem, with additional work up planned, Review of Psycho-Social Stressors (1), Review or order clinical lab tests (1), Discuss test with performing physician (1), Decision to obtain old records (1), Order AIMS Test (2), Established Problem, Worsening (2), New Problem, with no additional work-up planned (3), Review of Medication Regimen & Side Effects (2) and Review of New Medication or Change in Dosage (2)  Assessment: Axis I: Bipolar disorder depressed type, ADHD  Axis II: Deferred  Axis III:  Past Medical History  Diagnosis Date  . Esophageal reflux   . IBS (irritable bowel syndrome)   . Anxiety   . Depression   . Bipolar affective disorder   . TMJ syndrome   . Asthma     daily and prn inhalers  . Deviated nasal septum 05/2012  . Nasal turbinate hypertrophy 05/2012    bilateral  . Dental crowns present   . Eczema     right leg  . Migraine headache   . Hypertension   . ADHD (attention deficit hyperactivity disorder)   . Fibromyalgia     Plan:  I review her symptoms, history, current medication and records from previous doctor and intensive outpatient program.  Despite taking Lamictal, Celexa 60 mg, Xanax 1 mg at bedtime, Adderall 30 mg she continues to have depressive symptoms mood lability and crying spells.  She has family stress.  Her uncle is dying due to thyroid cancer.  She like to try a different  medication.  She has never tried Prozac before.  I recommended to try Prozac 20 mg daily and gradually increase to 40 mg in 2 weeks if she able to tolerate.  I recommended to discontinue Celexa since it is not helping.  Patient does not want Xanax for insomnia and she like to try something different.  I recommended to try Vistaril 25 mg 1-2 capsule at bedtime for insomnia and anxiety.  At this time I will continue Adderall XR 30 mg daily.  Discussed medication side effects and benefits.  We will consider  increasing Lamictal on a higher dose if patient continues to have symptoms of depression.  I encourage her to see Maurice Small on a regular basis.  I will see her again in 2-3 weeks.Time spent 25 minutes.  More than 50% of the time spent in psychoeducation, counseling and coordination of care.  Discuss safety plan that anytime having active suicidal thoughts or homicidal thoughts then patient need to call 911 or go to the local emergency room.    Jerricka Carvey T., MD 06/07/2014

## 2014-06-08 ENCOUNTER — Ambulatory Visit (HOSPITAL_COMMUNITY): Payer: Self-pay | Admitting: Psychiatry

## 2014-06-08 ENCOUNTER — Other Ambulatory Visit (HOSPITAL_COMMUNITY): Payer: MEDICAID

## 2014-06-08 ENCOUNTER — Telehealth: Payer: Self-pay | Admitting: Family Medicine

## 2014-06-08 NOTE — Telephone Encounter (Signed)
Pt was referred to Dr. Burney Gauze, they can't get her in until sometime in June & must wait for that template to open before can actually schedule, returned call to pt - she states she will see if Dr. Graciela Husbands at [facility] will see her for her wrist.  Told pt if he agrees to see her to let me know & I will forward any other office notes and NCV results to him.  Pt will check on this and call me back

## 2014-06-09 ENCOUNTER — Other Ambulatory Visit (HOSPITAL_COMMUNITY): Payer: MEDICAID

## 2014-06-12 ENCOUNTER — Other Ambulatory Visit (HOSPITAL_COMMUNITY): Payer: MEDICAID

## 2014-06-12 ENCOUNTER — Telehealth (HOSPITAL_COMMUNITY): Payer: Self-pay

## 2014-06-12 MED ORDER — TRAZODONE HCL 50 MG PO TABS: 50.0000 mg | ORAL_TABLET | Freq: Every evening | ORAL | Status: DC | PRN

## 2014-06-12 NOTE — Telephone Encounter (Signed)
Telephone call with patient stating concern the Hydroxyzine recently prescribed for her on 06/07/14 "is keeping me up".  Patient reported the Hydroxyzine was not helping her at all for sleep as appears to be having the reverse effect.  Patient reported if this medication was anything like Benadryl that has always revered on her as well and keeps her up.  Patient would like to know what else she can try to help her sleep?

## 2014-06-12 NOTE — Telephone Encounter (Signed)
Patient tried Vistaril but feels it is keeping her up.  She wants to try trazodone.  We will add low-dose trazodone and recommended to call us back if she has any question.  I will discontinue Vistaril.

## 2014-06-13 ENCOUNTER — Other Ambulatory Visit (HOSPITAL_COMMUNITY): Payer: MEDICAID

## 2014-06-14 ENCOUNTER — Telehealth: Payer: Self-pay | Admitting: Nurse Practitioner

## 2014-06-14 ENCOUNTER — Other Ambulatory Visit (HOSPITAL_COMMUNITY): Payer: MEDICAID

## 2014-06-14 NOTE — Telephone Encounter (Signed)
Pt notified and verbalized understanding that we can no longer prescribe or provide Depo Provera to Medicaid pts. Advised her to go to health dpt.

## 2014-06-14 NOTE — Telephone Encounter (Signed)
Patient called inquiring about depo provera shot that she was getting from our office.  She wanted to ask Hoyle Sauer if her pharmacy can give her the shot (since they give other shots), and Hoyle Sauer just give her the prescription for it?  In the mean time, she is calling the pharmacy to check if they would be able to do this.

## 2014-06-15 ENCOUNTER — Ambulatory Visit: Payer: Medicaid Other

## 2014-06-15 NOTE — Telephone Encounter (Signed)
Pt called back to say that she has appointment with Dr. Eugene Garnet (partner to Dr. Graciela Husbands) on 07/06/14 for her wrist/hand issue, she asked that I send them anything needed, call [phone] to get fax# and to give [facility] for visit

## 2014-06-16 ENCOUNTER — Other Ambulatory Visit (HOSPITAL_COMMUNITY): Payer: MEDICAID

## 2014-06-19 ENCOUNTER — Other Ambulatory Visit (HOSPITAL_COMMUNITY): Payer: MEDICAID

## 2014-06-19 ENCOUNTER — Encounter: Payer: Self-pay | Admitting: Internal Medicine

## 2014-06-20 ENCOUNTER — Other Ambulatory Visit (HOSPITAL_COMMUNITY): Payer: MEDICAID

## 2014-06-20 NOTE — Telephone Encounter (Signed)
Obtained fax # & records were faxed

## 2014-06-21 ENCOUNTER — Ambulatory Visit (INDEPENDENT_AMBULATORY_CARE_PROVIDER_SITE_OTHER): Payer: MEDICAID | Admitting: Psychiatry

## 2014-06-21 ENCOUNTER — Other Ambulatory Visit (HOSPITAL_COMMUNITY): Payer: MEDICAID

## 2014-06-21 ENCOUNTER — Encounter (HOSPITAL_COMMUNITY): Payer: Self-pay | Admitting: Psychiatry

## 2014-06-21 VITALS — BP 135/78 | HR 105 | Ht 65.0 in | Wt 210.8 lb

## 2014-06-21 DIAGNOSIS — F3131 Bipolar disorder, current episode depressed, mild: Secondary | ICD-10-CM | POA: Diagnosis not present

## 2014-06-21 DIAGNOSIS — F909 Attention-deficit hyperactivity disorder, unspecified type: Secondary | ICD-10-CM | POA: Diagnosis not present

## 2014-06-21 DIAGNOSIS — F988 Other specified behavioral and emotional disorders with onset usually occurring in childhood and adolescence: Secondary | ICD-10-CM

## 2014-06-21 MED ORDER — AMPHETAMINE-DEXTROAMPHET ER 20 MG PO CP24: 20.0000 mg | ORAL_CAPSULE | Freq: Every day | ORAL | Status: DC

## 2014-06-21 MED ORDER — FLUOXETINE HCL 40 MG PO CAPS: 40.0000 mg | ORAL_CAPSULE | Freq: Every day | ORAL | Status: DC

## 2014-06-21 MED ORDER — LAMOTRIGINE 200 MG PO TABS: 200.0000 mg | ORAL_TABLET | Freq: Every day | ORAL | Status: DC

## 2014-06-21 MED ORDER — TRAZODONE HCL 50 MG PO TABS
50.0000 mg | ORAL_TABLET | Freq: Every evening | ORAL | Status: DC | PRN
Start: 2014-06-21 — End: 2014-10-31

## 2014-06-21 MED ORDER — ALPRAZOLAM 0.5 MG PO TABS: 0.5000 mg | ORAL_TABLET | Freq: Every evening | ORAL | Status: DC | PRN

## 2014-06-21 MED ORDER — AMPHETAMINE-DEXTROAMPHET ER 20 MG PO CP24
20.0000 mg | ORAL_CAPSULE | Freq: Every day | ORAL | Status: DC
Start: 2014-06-21 — End: 2014-08-30

## 2014-06-21 NOTE — Progress Notes (Signed)
Kristin Stephens [phone] Progress Note  Kristin Stephens [id number] 29 y.o.  06/21/2014 3:12 PM  Chief Complaint:  Medication management and follow-up.    History of Present Illness:  Kristin Stephens came for her follow-up appointment.  On her last visit we try Vistaril however she complained off poor sleep and be discontinued and recommended to try trazodone 50 mg at bedtime.  She is feeling much better.  She is less anxious less depressed.  She is more in acceptance of her uncle who is dying due to cancer.  She reported her depression and anxiety is much better with the Prozac.  She is no longer taking Celexa.  She also 1 to try low-dose Adderall because she has noticed that Adderall may make some time more anxious and nervous.  She denies any side effects of the medication.  She has no rash or itching.  She scheduled to see Maurice Small next week for counseling.  Patient denies drinking or using any illegal substances.  She is enrolled in [employer] and studying human services.  Her appetite is okay.  Her vitals are stable.  Patient lives with her 90-year-old daughter.    Suicidal Ideation: No Plan Formed: No Patient has means to carry out plan: No  Homicidal Ideation: No Plan Formed: No Patient has means to carry out plan: No  Medical History; Patient has history of migraine headache, GERD, allergic rhinitis, hypertension, irritable bowel syndrome and aortic valve repaired.  She has history of ASD and VSD repair, Coaraction of aorta repair, cardiac catheterization and flexible sigmoidoscopy.  Past Psychiatric History/Hospitalization(s) Patient endorse history of mood swing, depression, anger and poor attention concentration.  She is diagnosed with bipolar disorder and ADD.  She has history of self abusive behavior in the past and she remember cutting herself with a razor blade when her grandmother died.  She has not done any self abusive behavior in a while.  She was seen this Probation officer until April  2012.  After that she was seeing Dr. Harrington Challenger in [place].  In the past she had tried Wellbutrin. Anxiety: No Bipolar Disorder: Yes Depression: Yes Mania: Yes Psychosis: No Schizophrenia: No Personality Disorder: No Hospitalization for psychiatric illness: No History of Electroconvulsive Shock Therapy: No Prior Suicide Attempts: Patient has self abusive behavior.   Review of Systems: Psychiatric: Agitation: No Hallucination: No Depressed Mood: No Insomnia: No Hypersomnia: No Altered Concentration: No Feels Worthless: Yes Grandiose Ideas: No Belief In Special Powers: No New/Increased Substance Abuse: No Compulsions: No  Neurologic: Headache: Yes Seizure: No Paresthesias: No   Musculoskeletal: Strength & Muscle Tone: within normal limits Gait & Station: normal Patient leans: N/A  Outpatient Encounter Prescriptions as of 06/21/2014  Medication Sig  . acetaminophen (TYLENOL) 500 MG tablet Take 500 mg by mouth every 6 (six) hours as needed for moderate pain or headache.   . albuterol (PROVENTIL HFA;VENTOLIN HFA) 108 (90 BASE) MCG/ACT inhaler Inhale 1-2 puffs into the lungs every 6 (six) hours as needed for wheezing.  Marland Kitchen ALPRAZolam (XANAX) 0.5 MG tablet Take 1 tablet (0.5 mg total) by mouth at bedtime as needed for anxiety.  Marland Kitchen amphetamine-dextroamphetamine (ADDERALL XR) 20 MG 24 hr capsule Take 1 capsule (20 mg total) by mouth daily.  . beclomethasone (QVAR) 80 MCG/ACT inhaler Inhale 2 puffs into the lungs 2 (two) times daily.  . clobetasol cream (TEMOVATE) 2.35 % Apply 1 application topically 2 (two) times daily as needed (eczema rash).  . cyclobenzaprine (FLEXERIL) 10 MG tablet Take 1 tablet (10  mg total) by mouth at bedtime. Prn muscle spasms  . cycloSPORINE (RESTASIS) 0.05 % ophthalmic emulsion Place 1 drop into both eyes 2 (two) times daily.  Marland Kitchen DEXILANT 60 MG capsule TAKE 1 CAPSULE BY MOUTH ONCE DAILY.  . DiphenhydrAMINE HCl (BENADRYL ALLERGY PO) Take by mouth.  Marland Kitchen  FLUoxetine (PROZAC) 40 MG capsule Take 1 capsule (40 mg total) by mouth daily.  . fluticasone (FLONASE) 50 MCG/ACT nasal spray INSTILL 2 SPRAYS INTO EACH NOSTRIL DAILY.  Marland Kitchen gabapentin (NEURONTIN) 300 MG capsule Two po in the morning; two po in the afternoon; three po in the evening  . HYDROcodone-acetaminophen (NORCO) 7.5-325 MG per tablet One po qd prn pain  . ketorolac (TORADOL) 10 MG tablet Take 1 tablet (10 mg total) by mouth every 6 (six) hours as needed. Do not use more than 5 days continuously; max dose 40 mg in 24 hrs  . lamoTRIgine (LAMICTAL) 200 MG tablet Take 1 tablet (200 mg total) by mouth at bedtime.  Marland Kitchen loratadine (CLARITIN) 10 MG tablet Take 1 tablet (10 mg total) by mouth daily as needed for rhinitis.  . medroxyPROGESTERone (DEPO-PROVERA) 150 MG/ML injection Inject 150 mg into the muscle every 3 (three) months.  . metoCLOPramide (REGLAN) 10 MG tablet Take 1 tablet (10 mg total) by mouth 3 (three) times daily as needed for nausea (headache / nausea).  . traZODone (DESYREL) 50 MG tablet Take 1 tablet (50 mg total) by mouth at bedtime as needed for sleep.  . valACYclovir (VALTREX) 1000 MG tablet Take 1 tablet (1,000 mg total) by mouth 3 (three) times daily.  . [DISCONTINUED] ALPRAZolam (XANAX) 0.5 MG tablet Take 0.5 mg by mouth.  . [DISCONTINUED] amphetamine-dextroamphetamine (ADDERALL XR) 20 MG 24 hr capsule Take 1 capsule (20 mg total) by mouth daily.  . [DISCONTINUED] amphetamine-dextroamphetamine (ADDERALL XR) 30 MG 24 hr capsule Take 1 capsule (30 mg total) by mouth daily.  . [DISCONTINUED] FLUoxetine (PROZAC) 20 MG capsule Take 1 capsule (20 mg total) by mouth daily.  . [DISCONTINUED] hydrOXYzine (VISTARIL) 25 MG capsule Take 1-2 capsule at bed time for insomnia  . [DISCONTINUED] lamoTRIgine (LAMICTAL) 200 MG tablet Take 1 tablet (200 mg total) by mouth at bedtime.  . [DISCONTINUED] traZODone (DESYREL) 50 MG tablet Take 1 tablet (50 mg total) by mouth at bedtime as needed for  sleep.    No results found for this or any previous visit (from the past 2160 hour(s)).  Physical Exam: Consitutional ;  BP 135/78 mmHg  Pulse 105  Ht 5\' 5"  (1.651 m)  Wt 210 lb 12.8 oz (95.618 kg)  BMI 35.08 kg/m2  Mental Status Examination;  Patient is casually dressed and groomed.  She is pleasant and cooperative.  She maintained good eye contact.  Her speech is slow but clear and coherent.  Her thought processes logical and goal-directed.  She described her mood euthymic and her affect is improved from the past.  There were no delusions, paranoia or any obsessive thoughts.  She denies any active or passive suicidal parts or homicidal thought.  Her psychomotor activity is normal.  She has no tremors, shakes or any EPS symptoms.  Her attention concentration is fair.  Her fund of knowledge is adequate.  There were no flight of ideas or any loose association.  Her cognition is good.  She is alert and oriented 3.  Her insight judgment and impulse control is okay.   Established Problem, Stable/Improving (1), Review of Psycho-Social Stressors (1), Review of Last Therapy Session (1)  and Review of Medication Regimen & Side Effects (2)  Assessment: Axis I: Bipolar disorder depressed type, ADHD  Axis II: Deferred  Axis III:  Past Medical History  Diagnosis Date  . Esophageal reflux   . IBS (irritable bowel syndrome)   . Anxiety   . Depression   . Bipolar affective disorder   . TMJ syndrome   . Asthma     daily and prn inhalers  . Deviated nasal septum 05/2012  . Nasal turbinate hypertrophy 05/2012    bilateral  . Dental crowns present   . Eczema     right leg  . Migraine headache   . Hypertension   . ADHD (attention deficit hyperactivity disorder)   . Fibromyalgia     Plan:  Patient is doing better on her current medication.  I will continue Lamictal 200 mg daily, Prozac 40 mg daily, trazodone 50 mg at bedtime.  We will reduce the dose of Adderall to 20 mg .  Patient is  scheduled to see Maurice Small next week for counseling.  Recommended to call us back if she has any question or any concern.  Follow-up in 2 months.    Latausha Flamm T., MD 06/21/2014

## 2014-06-22 ENCOUNTER — Other Ambulatory Visit (HOSPITAL_COMMUNITY): Payer: MEDICAID

## 2014-06-23 ENCOUNTER — Other Ambulatory Visit (HOSPITAL_COMMUNITY): Payer: MEDICAID

## 2014-06-26 ENCOUNTER — Other Ambulatory Visit (HOSPITAL_COMMUNITY): Payer: MEDICAID

## 2014-06-27 ENCOUNTER — Ambulatory Visit (INDEPENDENT_AMBULATORY_CARE_PROVIDER_SITE_OTHER): Payer: MEDICAID | Admitting: Psychiatry

## 2014-06-27 DIAGNOSIS — F3131 Bipolar disorder, current episode depressed, mild: Secondary | ICD-10-CM | POA: Diagnosis not present

## 2014-06-27 NOTE — Progress Notes (Signed)
  THERAPIST PROGRESS NOTE  Session Time: Tuesday 06/27/2014 1:05 PM - 2:00 PM  Participation Level: Active  Behavioral Response: CasualAlertAnxious/tearful  Type of Therapy: Individual Therapy  Treatment Goals addressed: Improve communication skills (decrease yelling) and ability to identify and verbalize feelings  Decrease anxiety and improve relaxation/coping techniques  Increase positive thought patterns and decreased negative thought patterns    Interventions: CBT and Supportive  Summary: KEONI HAVEY is a 29 y.o. female who presents witha long-standing history of symptoms of anxiety and depression along with mood swings beginning in early adolescence. Symptoms worsened after the birth of her daughter in January 2011 as patient experienced severe depression. She continues to experience recurrent periods of depression and periods of irritability and anger.  Patient last seen in January 2016.  Her uncle, a father figure to patient, was diagnosed with thyroid cancer in February. Patient reports becoming very distraught and depressed losing interest in activities, sleeping excessively, and having difficulty functioning in daily life. Her family took her to the [facility] where she was assessed and discharged but advised to participate in the intensive outpatient program. Patient attended the program from 05/16/2014 through 05/31/2014. Patient reports the program was very helpful and states feeling better. She now sees Dr. Adele Schilder for medication management and currently is taking Prozac, Lamictal, and trazodone. Patient reports Prozac has been very helpful and states feeling less depressed. She also reports improved sleep pattern since taking trazodone. She continues to express sadness about her uncle but expresses increased acceptance that he is dying. He currently receives hospice care in his home and has a prognosis of 2-3 weeks to live. Patient becomes very tearful as she talks  about her uncle. She reports increased support and understanding from her family including her mother and her siblings regarding her illness. Her sister in [name] is providing temporary care for patient's daughter until the middle of May. Patient does face time with daughter daily. She also reports strong support from her daughter's father and his family. Patient had to quit attending [employer] this semester due to stress . She is residing at home alone now as her mother is providing care to patient's uncle. Patient states she has been doing household chores and and going outside more to walk the dog. She also plans to begin a wellness [facility] [place] in 2 weeks.  Suicidal/Homicidal: No  Therapist Response: Therapist works with patient to process feelings, discuss grieving, identify positives gained through adversity, identify ways to improve structure and  daily routine, review relaxation techniques, identify ways to improve self-care regarding nutrition and exercise,   Plan: Return again in 2 wweks  Diagnosis: Axis I: Bipolar 1 disorder    Axis II: Deferred    [place], [place] 06/27/2014

## 2014-06-27 NOTE — Patient Instructions (Signed)
Discussed orally 

## 2014-06-28 ENCOUNTER — Ambulatory Visit (INDEPENDENT_AMBULATORY_CARE_PROVIDER_SITE_OTHER): Payer: Medicaid Other | Admitting: Nurse Practitioner

## 2014-06-28 ENCOUNTER — Encounter: Payer: Self-pay | Admitting: Nurse Practitioner

## 2014-06-28 VITALS — BP 126/80 | Ht 66.0 in | Wt 211.0 lb

## 2014-06-28 DIAGNOSIS — J300 Vasomotor rhinitis: Secondary | ICD-10-CM | POA: Diagnosis not present

## 2014-06-28 MED ORDER — HYDROCODONE-ACETAMINOPHEN 7.5-325 MG PO TABS: ORAL_TABLET | ORAL | Status: DC

## 2014-06-29 ENCOUNTER — Encounter: Payer: Self-pay | Admitting: Nurse Practitioner

## 2014-06-29 NOTE — Progress Notes (Signed)
Subjective:  Presents for complaints of cough and sore throat for the past 4 days. No fever. Sore throat mainly in the morning. Slight green drainage only in the morning, otherwise clear. Occasional cough. No wheezing. No itchy watery eyes or sneezing. Also patient is seeing Dr. Graciela Husbands at [facility] for orthopedic issues. Requesting a prescription for her pain medication. Last prescription was written on 05/25/2014 for 20 pills.  Objective:   BP 126/80 mmHg  Ht 5\' 6"  (1.676 m)  Wt 211 lb (95.709 kg)  BMI 34.07 kg/m2 NAD. Alert, oriented. TMs retracted, no erythema. Pharynx mildly injected with clear PND noted. Neck supple with mild soft anterior adenopathy. Lungs clear. Heart regular rate rhythm.  Assessment: Vasomotor rhinitis  Plan:  Meds ordered this encounter  Medications  . HYDROcodone-acetaminophen (NORCO) 7.5-325 MG per tablet    Sig: One po qd prn pain    Dispense:  20 tablet    Refill:  0    Order Specific Question:  Supervising Provider    Answer:  Mikey Kirschner [[id number]]   Restart Flonase and Claritin. Call back if symptoms worsen or persist. Given one-time prescription for hydrocodone, patient to use very sparingly. Avoid overuse due to addiction potential. Follow-up with orthopedic specialist as planned.

## 2014-07-04 ENCOUNTER — Other Ambulatory Visit: Payer: Self-pay | Admitting: Gastroenterology

## 2014-07-05 ENCOUNTER — Ambulatory Visit: Payer: Medicaid Other | Admitting: Pulmonary Disease

## 2014-07-13 ENCOUNTER — Ambulatory Visit: Payer: Medicaid Other | Admitting: Nurse Practitioner

## 2014-07-13 ENCOUNTER — Ambulatory Visit (HOSPITAL_COMMUNITY): Payer: Self-pay | Admitting: Psychiatry

## 2014-07-17 ENCOUNTER — Ambulatory Visit (INDEPENDENT_AMBULATORY_CARE_PROVIDER_SITE_OTHER): Payer: Medicaid Other | Admitting: Nurse Practitioner

## 2014-07-17 ENCOUNTER — Encounter: Payer: Self-pay | Admitting: Nurse Practitioner

## 2014-07-17 VITALS — BP 126/86 | Ht 65.0 in | Wt 212.6 lb

## 2014-07-17 DIAGNOSIS — B372 Candidiasis of skin and nail: Secondary | ICD-10-CM | POA: Diagnosis not present

## 2014-07-17 DIAGNOSIS — Z3042 Encounter for surveillance of injectable contraceptive: Secondary | ICD-10-CM | POA: Diagnosis not present

## 2014-07-17 DIAGNOSIS — R7309 Other abnormal glucose: Secondary | ICD-10-CM

## 2014-07-17 DIAGNOSIS — R739 Hyperglycemia, unspecified: Secondary | ICD-10-CM

## 2014-07-17 LAB — POCT URINE PREGNANCY: Preg Test, Ur: NEGATIVE

## 2014-07-17 LAB — POCT GLYCOSYLATED HEMOGLOBIN (HGB A1C): Hemoglobin A1C: 5

## 2014-07-17 MED ORDER — MEDROXYPROGESTERONE ACETATE 150 MG/ML IM SUSP
150.0000 mg | Freq: Once | INTRAMUSCULAR | Status: AC
  Administered 2014-07-17: 150 mg via INTRAMUSCULAR

## 2014-07-19 ENCOUNTER — Encounter: Payer: Self-pay | Admitting: Nurse Practitioner

## 2014-07-19 NOTE — Progress Notes (Signed)
Subjective:  Presents for recheck. Late for her Depo Provera injection mainly due to some confusion about payment by her insurance. No intercourse. No bleeding. Also c/o resolving rash under her breasts. Has been using OTC Lamisil which is working well.   Objective:   BP 126/86 mmHg  Ht 5\' 5"  (1.651 m)  Wt 212 lb 9.6 oz (96.435 kg)  BMI 35.38 kg/m2 NAD. Alert, oriented. Lungs clear. Heart RRR. BS elevated on previous labs but not clear if fasting. Fading pink papular rash noted under breasts.  Results for orders placed or performed in visit on 07/17/14  POCT urine pregnancy  Result Value Ref Range   Preg Test, Ur Negative   POCT glycosylated hemoglobin (Hb A1C)  Result Value Ref Range   Hemoglobin A1C 5.0      Assessment: Elevated blood sugar - Plan: POCT glycosylated hemoglobin (Hb A1C)  Encounter for surveillance of injectable contraceptive - Plan: POCT urine pregnancy, medroxyPROGESTERone (DEPO-PROVERA) injection 150 mg  Yeast dermatitis  Plan: reassured about normal hgba1c. Continue Lamisil as directed. Discussed importance of weight loss and activity. Patient picked up Depo Provera injection and given in our office today. Also strongly recommend preventive health physical.  Return in about 3 months (around 10/16/2014).

## 2014-07-21 ENCOUNTER — Encounter (HOSPITAL_COMMUNITY): Payer: Self-pay | Admitting: Emergency Medicine

## 2014-07-21 ENCOUNTER — Emergency Department (HOSPITAL_COMMUNITY)
Admission: EM | Admit: 2014-07-21 | Discharge: 2014-07-21 | Disposition: A | Discharge status: Home / Self Care | Payer: Medicaid Other | Attending: Emergency Medicine | Admitting: Emergency Medicine

## 2014-07-21 DIAGNOSIS — G43909 Migraine, unspecified, not intractable, without status migrainosus: Secondary | ICD-10-CM | POA: Insufficient documentation

## 2014-07-21 DIAGNOSIS — Y998 Other external cause status: Secondary | ICD-10-CM | POA: Diagnosis not present

## 2014-07-21 DIAGNOSIS — F419 Anxiety disorder, unspecified: Secondary | ICD-10-CM | POA: Diagnosis not present

## 2014-07-21 DIAGNOSIS — Z9889 Other specified postprocedural states: Secondary | ICD-10-CM | POA: Insufficient documentation

## 2014-07-21 DIAGNOSIS — Y9289 Other specified places as the place of occurrence of the external cause: Secondary | ICD-10-CM | POA: Diagnosis not present

## 2014-07-21 DIAGNOSIS — Z8739 Personal history of other diseases of the musculoskeletal system and connective tissue: Secondary | ICD-10-CM | POA: Insufficient documentation

## 2014-07-21 DIAGNOSIS — Z872 Personal history of diseases of the skin and subcutaneous tissue: Secondary | ICD-10-CM | POA: Diagnosis not present

## 2014-07-21 DIAGNOSIS — Z79899 Other long term (current) drug therapy: Secondary | ICD-10-CM | POA: Diagnosis not present

## 2014-07-21 DIAGNOSIS — Z720 Tobacco use: Secondary | ICD-10-CM | POA: Diagnosis not present

## 2014-07-21 DIAGNOSIS — F909 Attention-deficit hyperactivity disorder, unspecified type: Secondary | ICD-10-CM | POA: Diagnosis not present

## 2014-07-21 DIAGNOSIS — J45909 Unspecified asthma, uncomplicated: Secondary | ICD-10-CM | POA: Insufficient documentation

## 2014-07-21 DIAGNOSIS — Z8719 Personal history of other diseases of the digestive system: Secondary | ICD-10-CM | POA: Diagnosis not present

## 2014-07-21 DIAGNOSIS — X58XXXA Exposure to other specified factors, initial encounter: Secondary | ICD-10-CM | POA: Diagnosis not present

## 2014-07-21 DIAGNOSIS — I10 Essential (primary) hypertension: Secondary | ICD-10-CM | POA: Insufficient documentation

## 2014-07-21 DIAGNOSIS — T63441A Toxic effect of venom of bees, accidental (unintentional), initial encounter: Secondary | ICD-10-CM | POA: Insufficient documentation

## 2014-07-21 DIAGNOSIS — F329 Major depressive disorder, single episode, unspecified: Secondary | ICD-10-CM | POA: Insufficient documentation

## 2014-07-21 DIAGNOSIS — Z7952 Long term (current) use of systemic steroids: Secondary | ICD-10-CM | POA: Diagnosis not present

## 2014-07-21 DIAGNOSIS — Y9389 Activity, other specified: Secondary | ICD-10-CM | POA: Diagnosis not present

## 2014-07-21 DIAGNOSIS — T7840XA Allergy, unspecified, initial encounter: Secondary | ICD-10-CM

## 2014-07-21 MED ORDER — DEXAMETHASONE SODIUM PHOSPHATE 10 MG/ML IJ SOLN
10.0000 mg | Freq: Once | INTRAMUSCULAR | Status: AC
  Administered 2014-07-21: 10 mg via INTRAVENOUS
  Filled 2014-07-21: qty 1

## 2014-07-21 MED ORDER — DIPHENHYDRAMINE HCL 50 MG/ML IJ SOLN
50.0000 mg | Freq: Once | INTRAMUSCULAR | Status: AC
  Administered 2014-07-21: 50 mg via INTRAVENOUS
  Filled 2014-07-21: qty 1

## 2014-07-21 MED ORDER — SODIUM CHLORIDE 0.9 % IV BOLUS (SEPSIS)
1000.0000 mL | Freq: Once | INTRAVENOUS | Status: AC
  Administered 2014-07-21: 1000 mL via INTRAVENOUS

## 2014-07-21 NOTE — ED Notes (Signed)
Pt made aware to return if symptoms worsen or if any life threatening symptoms occur.   

## 2014-07-21 NOTE — Discharge Instructions (Signed)
If you were given medicines take as directed.  If you are on coumadin or contraceptives realize their levels and effectiveness is altered by many different medicines.  If you have any reaction (rash, tongues swelling, other) to the medicines stop taking and see a physician.   Please follow up as directed and return to the ER or see a physician for new or worsening symptoms.  Thank you. Filed Vitals:   07/21/14 0752 07/21/14 0800  BP: 118/74 116/73  Pulse: 81 82  Temp: 99.3 F (37.4 C)   TempSrc: Oral   Resp: 18   Height: 5\' 5"  (1.651 m)   Weight: 212 lb (96.163 kg)   SpO2: 95% 97%

## 2014-07-21 NOTE — ED Provider Notes (Signed)
CSN: [id number]     Arrival date & time 07/21/14  0750 History   First MD Initiated Contact with Patient 07/21/14 [phone]     Chief Complaint  Patient presents with  . Insect Bite     (Consider location/radiation/quality/duration/timing/severity/associated sxs/prior Treatment) HPI Comments: 29 year old female with history of high blood pressure, coarctation of aorta, asthma, OCD, bipolar presents with not feeling well since being bit by a wasp in the left arm 30 minutes prior to arrival. Patient has mild allergy to bees causing a rash. Patient has no allergy to coconut and sulfa however no other new exposures today. Patient felt generally unwell and lightheaded with mild swelling at the site of the bite. No breathing difficulties or throat swelling.  The history is provided by the patient.    Past Medical History  Diagnosis Date  . Esophageal reflux   . IBS (irritable bowel syndrome)   . Anxiety   . Depression   . Bipolar affective disorder   . TMJ syndrome   . Asthma     daily and prn inhalers  . Deviated nasal septum 05/2012  . Nasal turbinate hypertrophy 05/2012    bilateral  . Dental crowns present   . Eczema     right leg  . Migraine headache   . Hypertension   . ADHD (attention deficit hyperactivity disorder)   . Fibromyalgia    Past Surgical History  Procedure Laterality Date  . Asd and vsd repair  1989  . Coarctation of aorta repair  1989  . Cesarean section  04/12/2009  . Esophagogastroduodenoscopy  04/04/2008      Normal esophagus/Mild patchy erythema in the antrum/ Normal duodenal bulb, normal small bowel biopsy  . Flexible sigmoidoscopy  04/04/2008      Small internal hemorrhoids (poor bowel prep)  . Cardiac catheterization  06/18/2006  . Nasal septoplasty w/ turbinoplasty Bilateral 05/31/2012    Procedure: NASAL SEPTOPLASTY WITH TURBINATE REDUCTION;  Surgeon: Ascencion Dike, MD;  Location: [facility];  Service: ENT;  Laterality: Bilateral;  . Colonoscopy  N/A 05/20/2013    Procedure: COLONOSCOPY;  Surgeon: Danie Binder, MD;  [facility];  Service: Endoscopy;  Laterality: N/A;  115-moved to [place] notified pt  . Hemorrhoid banding N/A 05/20/2013    Procedure: HEMORRHOID BANDING;  Surgeon: Danie Binder, MD;  Location: [facility];  Service: Endoscopy;  Laterality: N/A;   Family History  Problem Relation Age of Onset  . Hyperlipidemia Mother   . Hypertension Mother   . Depression Mother   . Physical abuse Mother   . Sexual abuse Mother     possibly  . Emphysema Paternal Grandmother   . Heart disease Paternal Grandmother   . Bipolar disorder Paternal Grandmother   . Dementia Paternal Grandmother   . Heart disease Maternal Grandmother   . Dementia Maternal Grandmother   . Stroke Maternal Grandfather   . Prostate cancer Maternal Grandfather   . Personality disorder Father   . Bipolar disorder Father   . Alcohol abuse Father   . ADD / ADHD Sister   . Anxiety disorder Brother   . ADD / ADHD Brother   . ADD / ADHD Brother   . Seizures Daughter   . Drug abuse Neg Hx   . OCD Neg Hx   . Paranoid behavior Neg Hx   . Schizophrenia Neg Hx    History  Substance Use Topics  . Smoking status: Current Every Day  Smoker -- 0.50 packs/day for 10 years    Types: Cigarettes    Start date: 02/27/2002  . Smokeless tobacco: Never Used     Comment: quit smoking in 2010 during pregnacy then started back Feb. 2011   . 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  HENT: Negative for congestion.   Eyes: Negative for visual disturbance.  Respiratory: Negative for shortness of breath.   Cardiovascular: Negative for chest pain.  Gastrointestinal: Negative for vomiting and abdominal pain.  Genitourinary: Negative for dysuria and  flank pain.  Musculoskeletal: Negative for back pain, neck pain and neck stiffness.  Skin: Positive for rash.  Neurological: Positive for light-headedness. Negative for headaches.      Allergies  Sulfonamide derivatives and Coconut flavor  Home Medications   Prior to Admission medications   Medication Sig Start Date End Date Taking? Authorizing Provider  acetaminophen (TYLENOL) 500 MG tablet Take 500 mg by mouth every 6 (six) hours as needed for moderate pain or headache.    Yes Historical Provider, MD  albuterol (PROVENTIL HFA;VENTOLIN HFA) 108 (90 BASE) MCG/ACT inhaler Inhale 1-2 puffs into the lungs every 6 (six) hours as needed for wheezing. 03/29/13  Yes Kathee Delton, MD  ALPRAZolam Duanne Moron) 0.5 MG tablet Take 1 tablet (0.5 mg total) by mouth at bedtime as needed for anxiety. 06/21/14  Yes Kathlee Nations, MD  amphetamine-dextroamphetamine (ADDERALL XR) 20 MG 24 hr capsule Take 1 capsule (20 mg total) by mouth daily. 06/21/14 06/21/15 Yes Kathlee Nations, MD  beclomethasone (QVAR) 80 MCG/ACT inhaler Inhale 2 puffs into the lungs 2 (two) times daily. 07/04/13  Yes Kathee Delton, MD  clobetasol cream (TEMOVATE) 9.47 % Apply 1 application topically 2 (two) times daily as needed (eczema rash).   Yes Historical Provider, MD  cyclobenzaprine (FLEXERIL) 10 MG tablet Take 1 tablet (10 mg total) by mouth at bedtime. Prn muscle spasms 04/27/14  Yes Nilda Simmer, NP  cycloSPORINE (RESTASIS) 0.05 % ophthalmic emulsion Place 1 drop into both eyes 2 (two) times daily.   Yes Historical Provider, MD  DEXILANT 60 MG capsule TAKE 1 CAPSULE BY MOUTH ONCE DAILY. 07/05/14  Yes Mahala Menghini, PA-C  DiphenhydrAMINE HCl (BENADRYL ALLERGY PO) Take 1 tablet by mouth daily as needed (migraines). Takes in combination with reglan and toradol.   Yes Historical Provider, MD  FLUoxetine (PROZAC) 40 MG capsule Take 1 capsule (40 mg total) by mouth daily. 06/21/14 06/21/15 Yes Kathlee Nations, MD  fluticasone (FLONASE) 50  MCG/ACT nasal spray INSTILL 2 SPRAYS INTO EACH NOSTRIL DAILY. 02/13/14  Yes Mikey Kirschner, MD  gabapentin (NEURONTIN) 300 MG capsule Two po in the morning; two po in the afternoon; three po in the evening Patient taking differently: Take 600-900 mg by mouth See admin instructions. Two po in the morning; two po in the afternoon; three po in the evening 03/27/14  Yes Nilda Simmer, NP  HYDROcodone-acetaminophen (NORCO) 7.5-325 MG per tablet One po qd prn pain Patient taking differently: Take 1 tablet by mouth daily as needed for moderate pain.  06/28/14  Yes Nilda Simmer, NP  ibuprofen (ADVIL,MOTRIN) 400 MG tablet Take 400 mg by mouth 3 (  three) times daily.   Yes Historical Provider, MD  ketorolac (TORADOL) 10 MG tablet Take 10 mg by mouth daily as needed (migraines). Takes in combination with benadryl and reglan for migraines.   Yes Historical Provider, MD  lamoTRIgine (LAMICTAL) 200 MG tablet Take 1 tablet (200 mg total) by mouth at bedtime. 06/21/14  Yes Kathlee Nations, MD  loratadine (CLARITIN) 10 MG tablet Take 10 mg by mouth daily as needed for allergies.    Yes Historical Provider, MD  medroxyPROGESTERone (DEPO-PROVERA) 150 MG/ML injection Inject 150 mg into the muscle every 3 (three) months.    Yes Historical Provider, MD  metoCLOPramide (REGLAN) 10 MG tablet Take 1 tablet (10 mg total) by mouth 3 (three) times daily as needed for nausea (headache / nausea). 04/15/13  Yes Noemi Chapel, MD  traZODone (DESYREL) 50 MG tablet Take 1 tablet (50 mg total) by mouth at bedtime as needed for sleep. 06/21/14  Yes Kathlee Nations, MD  valACYclovir (VALTREX) 1000 MG tablet Take 1 tablet (1,000 mg total) by mouth 3 (three) times daily. Patient not taking: Reported on 07/21/2014 03/27/14   Nilda Simmer, NP   BP 116/73 mmHg  Pulse 82  Temp(Src) 99.3 F (37.4 C) (Oral)  Resp 18  Ht 5\' 5"  (1.651 m)  Wt 212 lb (96.163 kg)  BMI 35.28 kg/m2  SpO2 97% Physical Exam  Constitutional: She is oriented  to person, place, and time. She appears well-developed and well-nourished.  HENT:  Head: Normocephalic and atraumatic.  No stridor, no angioedema  Eyes: Conjunctivae are normal. Right eye exhibits no discharge. Left eye exhibits no discharge.  Neck: Normal range of motion. Neck supple. No tracheal deviation present.  Cardiovascular: Normal rate and regular rhythm.   Pulmonary/Chest: Effort normal and breath sounds normal.  Abdominal: Soft. She exhibits no distension. There is no tenderness. There is no guarding.  Musculoskeletal: She exhibits no edema.  Neurological: She is alert and oriented to person, place, and time.  Skin: Skin is warm. No rash noted.  2 cm area of mild swelling and erythema left distal forearm, no streaking or induration  Psychiatric: She has a normal mood and affect.  Nursing note and vitals reviewed.   ED Course  Procedures (including critical care time) Labs Review Labs Reviewed - No data to display  Imaging Review No results found.   EKG Interpretation None      MDM   Final diagnoses:  Allergic reaction, initial encounter    Patient presents with allergic reaction symptoms, IV fluids steroids and Benadryl ordered. No angioedema, mild symptoms at this time.  Observation in ED. Well appearing recheck, feels improved. Results and differential diagnosis were discussed with the patient/parent/guardian. Close follow up outpatient was discussed, comfortable with the plan.   Medications  dexamethasone (DECADRON) injection 10 mg (10 mg Intravenous Given 07/21/14 0821)  sodium chloride 0.9 % bolus 1,000 mL (1,000 mLs Intravenous New Bag/Given 07/21/14 0821)  diphenhydrAMINE (BENADRYL) injection 50 mg (50 mg Intravenous Given 07/21/14 0821)    Filed Vitals:   07/21/14 0752 07/21/14 0800  BP: 118/74 116/73  Pulse: 81 82  Temp: 99.3 F (37.4 C)   TempSrc: Oral   Resp: 18   Height: 5\' 5"  (1.651 m)   Weight: 212 lb (96.163 kg)   SpO2: 95% 97%     Final diagnoses:  Allergic reaction, initial encounter        Elnora Morrison, MD 07/21/14 [phone]

## 2014-07-21 NOTE — ED Notes (Signed)
Patient brought in via EMS. Alert and oriented. Airway patent. Patient c/o feeling dizzy with tightness in chest after being stung by wasp to left anterior forearm. Denies any shortness of breath or rash. Patient concerned due to previous reaction to bee sting causing rash to body, per patient never stung by wasp. No oral swelling, difficulty breath or swallowing noted.

## 2014-07-25 ENCOUNTER — Ambulatory Visit (INDEPENDENT_AMBULATORY_CARE_PROVIDER_SITE_OTHER): Payer: MEDICAID | Admitting: Psychiatry

## 2014-07-25 DIAGNOSIS — F3131 Bipolar disorder, current episode depressed, mild: Secondary | ICD-10-CM | POA: Diagnosis not present

## 2014-07-25 NOTE — Patient Instructions (Signed)
Discussed orally 

## 2014-07-25 NOTE — Progress Notes (Signed)
  THERAPIST PROGRESS NOTE  Session Time: Tuesday 07/25/2014 1:15 PM - 2:08 PM  Participation Level: Active  Behavioral Response: CasualAlert/tearful  Type of Therapy: Individual Therapy  Treatment Goals addressed: Improve communication skills (decrease yelling) and ability to identify and verbalize feelings  Decrease anxiety and improve relaxation/coping techniques  Increase positive thought patterns and decreased negative thought patterns    Interventions: CBT and Supportive  Summary: Kristin Stephens is a 29 y.o. female who presents witha long-standing history of symptoms of anxiety and depression along with mood swings beginning in early adolescence. Symptoms worsened after the birth of her daughter in January 2011 as patient experienced severe depression. She continues to experience recurrent periods of depression and periods of irritability and anger.  Patient reports feeling better since last session. She reports improved mood since taking antidepressant as prescribed by psychiatrist Dr. Adele Schilder. She has been more active and has tried to improve daily structure and routine. Patient has been attending classes at the [employer] in [employer] twice a week and says these have been helpful. She continues to reside at home alone as mother continues to provide care for patient's uncle in [place]. Patient expresses sadness about uncle's condition. She becomes tearful when discussing uncle but also is able to share humorous memories of uncle and laugh. She expresses guilt about some of her thoughts related to uncle's current condition. She continues to have support from her siblings and her daughter's father. She reports having conflict with her brother-in-law regarding negative comments he said to patient. Patient reports being assertive and setting boundaries rather than being aggressive. She reports no emotional anger outbursts since last session.  Suicidal/Homicidal:  No  Therapist Response: Therapist works with patient to facilitate willingness to accept feelings and thoughts as part of the human experience, process grief and loss issues, praise patient's use of assertiveness skills and efforts to improve self care/daily structure and routine  Plan: Return again in 2 wweks  Diagnosis: Axis I: Bipolar 1 disorder    Axis II: Deferred    Tyner Codner, LCSW 07/25/2014

## 2014-08-03 ENCOUNTER — Other Ambulatory Visit: Payer: Self-pay | Admitting: Nurse Practitioner

## 2014-08-15 ENCOUNTER — Ambulatory Visit (HOSPITAL_COMMUNITY): Payer: Self-pay | Admitting: Psychiatry

## 2014-08-16 ENCOUNTER — Encounter (HOSPITAL_COMMUNITY): Payer: Self-pay | Admitting: *Deleted

## 2014-08-21 ENCOUNTER — Ambulatory Visit (HOSPITAL_COMMUNITY): Payer: Self-pay | Admitting: Psychiatry

## 2014-08-25 ENCOUNTER — Other Ambulatory Visit (HOSPITAL_COMMUNITY): Payer: Self-pay | Admitting: Psychiatry

## 2014-08-29 ENCOUNTER — Ambulatory Visit (INDEPENDENT_AMBULATORY_CARE_PROVIDER_SITE_OTHER): Payer: MEDICAID | Admitting: Psychiatry

## 2014-08-29 DIAGNOSIS — F3131 Bipolar disorder, current episode depressed, mild: Secondary | ICD-10-CM | POA: Diagnosis not present

## 2014-08-30 ENCOUNTER — Encounter (HOSPITAL_COMMUNITY): Payer: Self-pay | Admitting: Psychiatry

## 2014-08-30 ENCOUNTER — Ambulatory Visit (INDEPENDENT_AMBULATORY_CARE_PROVIDER_SITE_OTHER): Payer: MEDICAID | Admitting: Psychiatry

## 2014-08-30 VITALS — BP 111/74 | HR 89 | Ht 65.0 in | Wt 215.2 lb

## 2014-08-30 DIAGNOSIS — F909 Attention-deficit hyperactivity disorder, unspecified type: Secondary | ICD-10-CM

## 2014-08-30 DIAGNOSIS — F3130 Bipolar disorder, current episode depressed, mild or moderate severity, unspecified: Secondary | ICD-10-CM

## 2014-08-30 DIAGNOSIS — F419 Anxiety disorder, unspecified: Secondary | ICD-10-CM | POA: Diagnosis not present

## 2014-08-30 DIAGNOSIS — F3131 Bipolar disorder, current episode depressed, mild: Secondary | ICD-10-CM

## 2014-08-30 DIAGNOSIS — F988 Other specified behavioral and emotional disorders with onset usually occurring in childhood and adolescence: Secondary | ICD-10-CM

## 2014-08-30 MED ORDER — FLUOXETINE HCL 40 MG PO CAPS: 40.0000 mg | ORAL_CAPSULE | Freq: Every day | ORAL | Status: DC

## 2014-08-30 MED ORDER — AMPHETAMINE-DEXTROAMPHET ER 20 MG PO CP24: 20.0000 mg | ORAL_CAPSULE | Freq: Every day | ORAL | Status: DC

## 2014-08-30 MED ORDER — LAMOTRIGINE 200 MG PO TABS: 200.0000 mg | ORAL_TABLET | Freq: Every day | ORAL | Status: DC

## 2014-08-30 MED ORDER — ALPRAZOLAM 0.5 MG PO TABS
0.5000 mg | ORAL_TABLET | Freq: Every evening | ORAL | Status: DC | PRN
Start: 2014-08-30 — End: 2014-11-23

## 2014-08-30 NOTE — Patient Instructions (Signed)
Discussed orally 

## 2014-08-30 NOTE — Progress Notes (Signed)
  THERAPIST PROGRESS NOTE  Session Time: Tuesday 08/29/2014 2:10 PM - 3:10 PM  Participation Level: Active  Behavioral Response: CasualAlert/tearful  Type of Therapy: Individual Therapy  Treatment Goals addressed: Improve communication skills (decrease yelling) and ability to identify and verbalize feelings  Decrease anxiety and improve relaxation/coping techniques  Increase positive thought patterns and decreased negative thought patterns    Interventions: CBT and Supportive  Summary: Kristin Stephens is a 29 y.o. female who presents witha long-standing history of symptoms of anxiety and depression along with mood swings beginning in early adolescence. Symptoms worsened after the birth of her daughter in January 2011 as patient experienced severe depression. She continues to experience recurrent periods of depression and periods of irritability and anger.  Patient reports increased sadness since last session as her uncle died on 09/03/2014. She reports making remarks at his [facility] on Aug 18, 2014 and reports this was helpful in coping. She states being happy he is not suffering any more and expresses guilt about being angry that he is gone. She has been talking with other family members about uncle and has part of his ashes. She also has his glasses. She is experiencing some anxiety regarding the approaching Father's Day holiday as her uncle was her father figure. She also is continuing to manage issues related to other transitions and loss this year including the breakup with her boyfriend in January 2016 and her family home now being prepared to sell. Her daughter still is temporarily residing with her sister but patient sees her regularly. Patient's mother continues to reside in uncle's home and patient continues to reside alone. She reports managing this fairly well and trying to accomplish tasks in preparing the family home for sale. Patient reports no anger outbursts. She has  continued to do well in being more assertive.   Suicidal/Homicidal: No  Therapist Response: Therapist works with patient to process grief and loss issues, began to discuss stages of grief, began to identify ways to cope with upcoming Father's Day,  facilitate willingness to accept feelings and thoughts as part of the human experience,  praise patient's use of assertiveness skills and efforts to improve self care/daily structure and routine  Plan: Return again in 2 weeks  Diagnosis: Axis I: Bipolar 1 disorder    Axis II: Deferred    Julieana Eshleman, LCSW 08/30/2014

## 2014-08-30 NOTE — Progress Notes (Signed)
[facility] [phone] Progress Note  Kristin Stephens [id number] 29 y.o.  08/30/2014 3:07 PM  Chief Complaint:  Medication management and follow-up.    History of Present Illness:  Kristin Stephens came for her follow-up appointment.  She is sad because uncle died 2 weeks ago after prolonged illness and complication due to cancer.  She is finally excepting the loss but she also seeing Peggy for grief counseling.  She like the Prozac and reducing the dose of Adderall.  She sleeping good.  She does not need trazodone every night.  She required Xanax at the funeral and she liked to keep it only for severe anxiety and nervousness.  Overall her mood is a stable.  She is still emotional when she talks about her uncle.  Patient denies any irritability, anger, feeling of hopelessness or worthlessness.  She denies any drinking or using any illegal substances.  Her attention and focus is good.  She is seeing Maurice Small for counseling.  Her appetite is okay.  Her vitals are stable.  Patient lives with her 47-year-old daughter.  Patient is enrolled in [employer] and studying human services.    Suicidal Ideation: No Plan Formed: No Patient has means to carry out plan: No  Homicidal Ideation: No Plan Formed: No Patient has means to carry out plan: No  Medical History; Patient has history of migraine headache, GERD, allergic rhinitis, hypertension, irritable bowel syndrome and aortic valve repaired.  She has history of ASD and VSD repair, Coaraction of aorta repair, cardiac catheterization and flexible sigmoidoscopy.  Past Psychiatric History/Hospitalization(s) Patient endorse history of mood swing, depression, anger and poor attention concentration.  She is diagnosed with bipolar disorder and ADD.  She has history of self abusive behavior in the past and she remember cutting herself with a razor blade when her grandmother died.  She has not done any self abusive behavior in a while.  She was seen this Probation officer until  April 2012.  After that she was seeing Dr. Harrington Challenger in [place].  In the past she had tried Wellbutrin. Anxiety: No Bipolar Disorder: Yes Depression: Yes Mania: Yes Psychosis: No Schizophrenia: No Personality Disorder: No Hospitalization for psychiatric illness: No History of Electroconvulsive Shock Therapy: No Prior Suicide Attempts: Patient has self abusive behavior.   Review of Systems  Cardiovascular: Negative for chest pain and palpitations.  Skin: Negative for itching and rash.  Neurological: Negative for dizziness.    Psychiatric: Agitation: No Hallucination: No Depressed Mood: No Insomnia: No Hypersomnia: No Altered Concentration: No Feels Worthless: No Grandiose Ideas: No Belief In Special Powers: No New/Increased Substance Abuse: No Compulsions: No  Neurologic: Headache: Yes Seizure: No Paresthesias: No   Musculoskeletal: Strength & Muscle Tone: within normal limits Gait & Station: normal Patient leans: N/A  Outpatient Encounter Prescriptions as of 08/30/2014  Medication Sig  . acetaminophen (TYLENOL) 500 MG tablet Take 500 mg by mouth every 6 (six) hours as needed for moderate pain or headache.   . albuterol (PROVENTIL HFA;VENTOLIN HFA) 108 (90 BASE) MCG/ACT inhaler Inhale 1-2 puffs into the lungs every 6 (six) hours as needed for wheezing.  Marland Kitchen ALPRAZolam (XANAX) 0.5 MG tablet Take 1 tablet (0.5 mg total) by mouth at bedtime as needed for anxiety.  Marland Kitchen amphetamine-dextroamphetamine (ADDERALL XR) 20 MG 24 hr capsule Take 1 capsule (20 mg total) by mouth daily.  . beclomethasone (QVAR) 80 MCG/ACT inhaler Inhale 2 puffs into the lungs 2 (two) times daily.  . clobetasol cream (TEMOVATE) 5.00 % Apply 1 application  topically 2 (two) times daily as needed (eczema rash).  . cyclobenzaprine (FLEXERIL) 10 MG tablet TAKE 1 TABLET BY MOUTH AT BEDTIME AS NEEDED FOR MUSCLE SPASMS.  . cycloSPORINE (RESTASIS) 0.05 % ophthalmic emulsion Place 1 drop into both eyes 2 (two) times  daily.  Marland Kitchen DEXILANT 60 MG capsule TAKE 1 CAPSULE BY MOUTH ONCE DAILY.  . DiphenhydrAMINE HCl (BENADRYL ALLERGY PO) Take 1 tablet by mouth daily as needed (migraines). Takes in combination with reglan and toradol.  Marland Kitchen FLUoxetine (PROZAC) 40 MG capsule Take 1 capsule (40 mg total) by mouth daily.  . fluticasone (FLONASE) 50 MCG/ACT nasal spray INSTILL 2 SPRAYS INTO EACH NOSTRIL DAILY.  Marland Kitchen gabapentin (NEURONTIN) 300 MG capsule Two po in the morning; two po in the afternoon; three po in the evening (Patient taking differently: Take 600-900 mg by mouth See admin instructions. Two po in the morning; two po in the afternoon; three po in the evening)  . HYDROcodone-acetaminophen (NORCO) 7.5-325 MG per tablet One po qd prn pain (Patient taking differently: Take 1 tablet by mouth daily as needed for moderate pain. )  . ibuprofen (ADVIL,MOTRIN) 400 MG tablet Take 400 mg by mouth 3 (three) times daily.  Marland Kitchen ketorolac (TORADOL) 10 MG tablet Take 10 mg by mouth daily as needed (migraines). Takes in combination with benadryl and reglan for migraines.  . lamoTRIgine (LAMICTAL) 200 MG tablet Take 1 tablet (200 mg total) by mouth at bedtime.  Marland Kitchen loratadine (CLARITIN) 10 MG tablet Take 10 mg by mouth daily as needed for allergies.   . medroxyPROGESTERone (DEPO-PROVERA) 150 MG/ML injection Inject 150 mg into the muscle every 3 (three) months.   . metoCLOPramide (REGLAN) 10 MG tablet Take 1 tablet (10 mg total) by mouth 3 (three) times daily as needed for nausea (headache / nausea).  . traZODone (DESYREL) 50 MG tablet Take 1 tablet (50 mg total) by mouth at bedtime as needed for sleep.  . valACYclovir (VALTREX) 1000 MG tablet Take 1 tablet (1,000 mg total) by mouth 3 (three) times daily. (Patient not taking: Reported on 07/21/2014)  . [DISCONTINUED] ALPRAZolam (XANAX) 0.5 MG tablet Take 1 tablet (0.5 mg total) by mouth at bedtime as needed for anxiety.  . [DISCONTINUED] amphetamine-dextroamphetamine (ADDERALL XR) 20 MG 24 hr  capsule Take 1 capsule (20 mg total) by mouth daily.  . [DISCONTINUED] FLUoxetine (PROZAC) 40 MG capsule Take 1 capsule (40 mg total) by mouth daily.  . [DISCONTINUED] lamoTRIgine (LAMICTAL) 200 MG tablet Take 1 tablet (200 mg total) by mouth at bedtime.   No facility-administered encounter medications on file as of 08/30/2014.    Recent Results (from the past 2160 hour(s))  POCT glycosylated hemoglobin (Hb A1C)     Status: None   Collection Time: 07/17/14 10:42 AM  Result Value Ref Range   Hemoglobin A1C 5.0   POCT urine pregnancy     Status: None   Collection Time: 07/17/14 10:51 AM  Result Value Ref Range   Preg Test, Ur Negative     Physical Exam: Consitutional ;  BP 111/74 mmHg  Pulse 89  Ht 5\' 5"  (1.651 m)  Wt 215 lb 3.2 oz (97.614 kg)  BMI 35.81 kg/m2  Mental Status Examination;  Patient is casually dressed and groomed.  She is sad but cooperative.  She maintained good eye contact.  Her speech is slow but clear and coherent.  Her thought processes logical and goal-directed.  She described her mood euthymic and her affect is improved from the past.  There were no delusions, paranoia or any obsessive thoughts.  She denies any active or passive suicidal parts or homicidal thought.  Her psychomotor activity is normal.  She has no tremors, shakes or any EPS symptoms.  Her attention concentration is fair.  Her fund of knowledge is adequate.  There were no flight of ideas or any loose association.  Her cognition is good.  She is alert and oriented 3.  Her insight judgment and impulse control is okay.   Established Problem, Stable/Improving (1), Review of Psycho-Social Stressors (1), Review of Last Therapy Session (1) and Review of Medication Regimen & Side Effects (2)  Assessment: Axis I: Bipolar disorder depressed type, ADHD, anxiety disorder  Axis II: Deferred  Axis III:  Past Medical History  Diagnosis Date  . Esophageal reflux   . IBS (irritable bowel syndrome)   .  Anxiety   . Depression   . Bipolar affective disorder   . TMJ syndrome   . Asthma     daily and prn inhalers  . Deviated nasal septum 05/2012  . Nasal turbinate hypertrophy 05/2012    bilateral  . Dental crowns present   . Eczema     right leg  . Migraine headache   . Hypertension   . ADHD (attention deficit hyperactivity disorder)   . Fibromyalgia     Plan:  Patient is doing better on her current medication.  I will continue Lamictal 200 mg daily, Prozac 40 mg daily, trazodone 50 mg at bedtime as needed.  Continue Adderall 20 mg daily and Xanax only as needed for severe anxiety and panic attack.  Encouraged to keep appointment with Vickii Chafe banning for counseling.  Recommended to call us back if she has any question, concern or if she feels worsening of the symptom.  Follow-up in 3 months.  Ricahrd Schwager T., MD 08/30/2014

## 2014-09-08 ENCOUNTER — Other Ambulatory Visit: Payer: Self-pay | Admitting: Nurse Practitioner

## 2014-09-20 ENCOUNTER — Ambulatory Visit (INDEPENDENT_AMBULATORY_CARE_PROVIDER_SITE_OTHER): Payer: MEDICAID | Admitting: Psychiatry

## 2014-09-20 DIAGNOSIS — F3131 Bipolar disorder, current episode depressed, mild: Secondary | ICD-10-CM | POA: Diagnosis not present

## 2014-09-20 NOTE — Patient Instructions (Signed)
Discussed orally 

## 2014-09-20 NOTE — Progress Notes (Signed)
   THERAPIST PROGRESS NOTE  Session Time: Wednesday 09/20/2014 9:05 AM -9:55 AM  Participation Level: Active  Behavioral Response: CasualAlert/tearful  Type of Therapy: Individual Therapy  Treatment Goals addressed: Improve communication skills (decrease yelling) and ability to identify and verbalize feelings  Decrease anxiety and improve relaxation/coping techniques  Increase positive thought patterns and decreased negative thought patterns    Interventions: CBT and Supportive  Summary: Kristin Stephens is a 28 y.o. female who presents witha long-standing history of symptoms of anxiety and depression along with mood swings beginning in early adolescence. Symptoms worsened after the birth of her daughter in January 2011 as patient experienced severe depression. She continues to experience recurrent periods of depression and periods of irritability and anger.  Patient's mother accompanies her to the initial part of session. Per mother's report, patient does not follow through on requests regarding chores and completing efforts to prepare their home for sale. She also reports having talked with patient about looking for a job and becoming more independent. She has given patient a deadline to move out by November 13, 2014.  They have discussed these issues in session with mother's therapist. Patient is angry and frustrated mother insisted on being in session but states she gave in just so mother would be quiet. Patient shares with therapist mother does not listen and just points out her bad habits. She says she does complete tasks but mother gets upset if it isn't done her way and in her time. Patient states knowing mother loves her but feeling rejected and like she can't do anything right in her mother's eyes.  Patient reports feeling treated like a teenager by her mother and sister but says they expect her to act like an adult. She states she is ready to find her own place and wants to focus on  taking care of her daughter.  Suicidal/Homicidal: No  Therapist Response: Therapist works with mother and patient to discuss possible options for family therapy, works with patient to process her feelings, identify ways to set and implement her personal goals including developing time line, identify coping statements, review relaxation techniques, encourage patient to journal and identify ways to ensure privacy  Plan: Return again in 2 weeks. Patient agrees to develop personal goals for next steps with time line and bring to session. She also agrees to journal   Diagnosis: Axis I: Bipolar 1 disorder    Axis II: Deferred    Renne Cornick, LCSW 09/20/2014

## 2014-09-25 ENCOUNTER — Ambulatory Visit (INDEPENDENT_AMBULATORY_CARE_PROVIDER_SITE_OTHER): Payer: Medicaid Other | Admitting: Internal Medicine

## 2014-09-25 ENCOUNTER — Encounter: Payer: Self-pay | Admitting: Internal Medicine

## 2014-09-25 VITALS — BP 118/66 | HR 86 | Ht 65.0 in | Wt 213.4 lb

## 2014-09-25 DIAGNOSIS — I359 Nonrheumatic aortic valve disorder, unspecified: Secondary | ICD-10-CM | POA: Diagnosis not present

## 2014-09-25 NOTE — Patient Instructions (Signed)
Medication Instructions:  Your physician recommends that you continue on your current medications as directed. Please refer to the Current Medication list given to you today.   Labwork: none  Testing/Procedures: Your physician has requested that you have an echocardiogram. Echocardiography is a painless test that uses sound waves to create images of your heart. It provides your doctor with information about the size and shape of your heart and how well your heart's chambers and valves are working. This procedure takes approximately one hour. There are no restrictions for this procedure.    Follow-Up: Your physician wants you to follow-up in: 18 months.  You will receive a reminder letter in the mail two months in advance. If you don't receive a letter, please call our office to schedule the follow-up appointment.   Any Other Special Instructions Will Be Listed Below (If Applicable).

## 2014-09-25 NOTE — Progress Notes (Signed)
Cardiology Office Note   Date:  09/25/2014   ID:  Kristin, Stephens 03-21-1986, MRN [id number]  PCP:  Mickie Hillier, MD  Cardiologist:   Dorris Carnes, MD   No chief complaint on file. Follow up of congenital hart dz      History of Present Illness: Kristin Stephens is a 29 y.o. female with a history of ASD/VSD/Coarctation repair. 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 acetaminophen (TYLENOL) 500 MG tablet Take 500 mg by mouth every 6 (six) hours as needed for moderate pain or headache.     . albuterol (PROVENTIL HFA;VENTOLIN HFA) 108 (90 BASE) MCG/ACT inhaler Inhale 1-2 puffs into the lungs every 6 (six) hours as needed for wheezing. 1 Inhaler 3  . ALPRAZolam (XANAX) 0.5 MG tablet Take 1 tablet (0.5 mg total) by mouth at bedtime as needed for anxiety. 30 tablet 0  . amphetamine-dextroamphetamine (ADDERALL XR) 20 MG 24 hr capsule Take 1 capsule (20 mg total) by mouth daily. 90 capsule 0  . beclomethasone (QVAR) 80 MCG/ACT inhaler Inhale 2 puffs into the lungs 2 (two) times daily. 1 Inhaler 3  . clobetasol cream (TEMOVATE) 6.38 % Apply 1 application topically 2 (two) times daily as needed (eczema rash).    . cyclobenzaprine (FLEXERIL) 10 MG tablet TAKE ONE TABLET BY MOUTH AT BEDTIME AS NEEDED FOR MUSCLE SPASMS. 30 tablet 0  . cycloSPORINE (RESTASIS) 0.05 % ophthalmic emulsion Place 1 drop into both eyes 2 (two) times daily.    Marland Kitchen DEXILANT 60 MG capsule TAKE 1 CAPSULE BY MOUTH ONCE DAILY. 31 capsule 11  . DiphenhydrAMINE HCl (BENADRYL ALLERGY PO) Take 1 tablet by mouth daily as needed (migraines). Takes in combination with reglan and toradol.    Marland Kitchen FLUoxetine (PROZAC) 40 MG capsule Take 1 capsule (40 mg total) by mouth daily. 30 capsule 2  . fluticasone (FLONASE) 50 MCG/ACT nasal  spray INSTILL 2 SPRAYS INTO EACH NOSTRIL DAILY. 16 g 6  . gabapentin (NEURONTIN) 300 MG capsule Two po in the morning; two po in the afternoon; three po in the evening (Patient taking differently: Take 600-900 mg by mouth See admin instructions. Two po in the morning; two po in the afternoon; three po in the evening) 210 capsule 5  . HYDROcodone-acetaminophen (NORCO) 7.5-325 MG per tablet One po qd prn pain (Patient taking differently: Take 1 tablet by mouth daily as needed for moderate pain. ) 20 tablet 0  . ibuprofen (ADVIL,MOTRIN) 400 MG tablet Take 400 mg by mouth 3 (three) times daily.    Marland Kitchen ketorolac (TORADOL) 10 MG tablet Take 10 mg by mouth daily as needed (migraines). Takes in combination with benadryl and reglan for migraines.    . lamoTRIgine (LAMICTAL) 200 MG tablet Take 1 tablet (200 mg total) by mouth at bedtime. 30 tablet 2  . loratadine (CLARITIN) 10 MG tablet Take 10 mg by mouth daily as needed for allergies.     . medroxyPROGESTERone (DEPO-PROVERA) 150 MG/ML injection Inject 150 mg into the muscle every 3 (three) months.     . metoCLOPramide (REGLAN) 10 MG tablet Take 1 tablet (10 mg total) by mouth 3 (three) times daily as needed for nausea (headache / nausea). 20 tablet 0  .  traZODone (DESYREL) 50 MG tablet Take 1 tablet (50 mg total) by mouth at bedtime as needed for sleep. 30 tablet 1  . valACYclovir (VALTREX) 1000 MG tablet Take 1 tablet (1,000 mg total) by mouth 3 (three) times daily. 21 tablet 0   No current facility-administered medications for this visit.    Allergies:   Sulfonamide derivatives and Coconut flavor   Past Medical History  Diagnosis Date  . Esophageal reflux   . IBS (irritable bowel syndrome)   . Anxiety   . Depression   . Bipolar affective disorder   . TMJ syndrome   . Asthma     daily and prn inhalers  . Deviated nasal septum 05/2012  . Nasal turbinate hypertrophy 05/2012    bilateral  . Dental crowns present   . Eczema     right leg  .  Migraine headache   . Hypertension   . ADHD (attention deficit hyperactivity disorder)   . Fibromyalgia     Past Surgical History  Procedure Laterality Date  . Asd and vsd repair  1989  . Coarctation of aorta repair  1989  . Cesarean section  04/12/2009  . Esophagogastroduodenoscopy  04/04/2008      Normal esophagus/Mild patchy erythema in the antrum/ Normal duodenal bulb, normal small bowel biopsy  . Flexible sigmoidoscopy  04/04/2008      Small internal hemorrhoids (poor bowel prep)  . Cardiac catheterization  06/18/2006  . Nasal septoplasty w/ turbinoplasty Bilateral 05/31/2012    Procedure: NASAL SEPTOPLASTY WITH TURBINATE REDUCTION;  Surgeon: Ascencion Dike, MD;  Location: [facility];  Service: ENT;  Laterality: Bilateral;  . Colonoscopy N/A 05/20/2013    Procedure: COLONOSCOPY;  Surgeon: Danie Binder, MD;  [facility];  Service: Endoscopy;  Laterality: N/A;  115-moved to [place] notified pt  . Hemorrhoid banding N/A 05/20/2013    Procedure: HEMORRHOID BANDING;  Surgeon: Danie Binder, MD;  [facility];  Service: Endoscopy;  Laterality: N/A;     Social History:  The patient  reports that she has been smoking Cigarettes.  She started smoking about 12 years ago. She has a 5 pack-year smoking history. She has never used smokeless tobacco. She reports that she does not drink alcohol or use illicit drugs.   Family History:  The patient's family history includes ADD / ADHD in her brother, brother, and sister; Alcohol abuse in her father; Anxiety disorder in her brother; Bipolar disorder in her father and paternal grandmother; Dementia in her maternal grandmother and paternal grandmother; Depression in her mother; Emphysema in her paternal grandmother; Heart disease in her maternal grandmother and paternal grandmother; Hyperlipidemia in her mother; Hypertension in her mother; Personality disorder in her father; Physical abuse in her mother; Prostate  cancer in her maternal grandfather; Seizures in her daughter; Sexual abuse in her mother; Stroke in her maternal grandfather. There is no history of Drug abuse, OCD, Paranoid behavior, or Schizophrenia.    ROS:  Please see the history of present illness. All other systems are reviewed and  Negative to the above problem except as noted.    PHYSICAL EXAM: VS:  BP 118/66 mmHg  Pulse 86  Ht 5\' 5"  (1.651 m)  Wt 213 lb 6.4 oz (96.798 kg)  BMI 35.51 kg/m2  GEN: Well nourished, well developed, in no acute distress HEENT: normal Neck: no JVD, carotid bruits, or masses Cardiac: RRR; Gi II/VI systolic murmur LSB  , rubs, or gallops,no edema  Respiratory:  clear to auscultation bilaterally, normal work of breathing GI: soft, nontender, nondistended, + BS  No hepatomegaly  MS: no deformity Moving all extremities   Skin: warm and dry, no rash Neuro:  Strength and sensation are intact Psych: euthymic mood, full affect   EKG:  EKG is ordered today.  SR 86 bpm     Lipid Panel    Component Value Date/Time   CHOL  02/06/2007 0404    139        ATP III CLASSIFICATION:  <200     mg/dL   Desirable  200-239  mg/dL   Borderline High  >=240    mg/dL   High   TRIG 72 02/06/2007 0404   HDL 33* 02/06/2007 0404   CHOLHDL 4.2 02/06/2007 0404   VLDL 14 02/06/2007 0404   LDLCALC  02/06/2007 0404    92        Total Cholesterol/HDL:CHD Risk Coronary Heart Disease Risk Table                     Men   Women  1/2 Average Risk   3.4   3.3      Wt Readings from Last 3 Encounters:  09/25/14 213 lb 6.4 oz (96.798 kg)  08/30/14 215 lb 3.2 oz (97.614 kg)  07/21/14 212 lb (96.163 kg)      ASSESSMENT AND PLAN:  1  Congenital heart dz. S/p repair  2.  Aortic valve dz.  WIll get echo to reeval Aortic insufficiency    3.  HCM  WIll need to check on lipids     Encouraged her to stay acitve  Tentative f/u in 1 year      Current medicines are reviewed at length with the patient today.  The patient  does not have concerns regarding medicines.  The following changes have been made:   Labs/ tests ordered today include: No orders of the defined types were placed in this encounter.     Disposition:   FU with  in   Signed, Dorris Carnes, MD  09/25/2014 4:54 PM    [address], [place], [place]  [address] Phone: [phone]; Fax: [phone]

## 2014-09-29 ENCOUNTER — Telehealth (HOSPITAL_COMMUNITY): Payer: Self-pay

## 2014-09-29 DIAGNOSIS — F988 Other specified behavioral and emotional disorders with onset usually occurring in childhood and adolescence: Secondary | ICD-10-CM

## 2014-09-29 MED ORDER — AMPHETAMINE-DEXTROAMPHET ER 20 MG PO CP24: 20.0000 mg | ORAL_CAPSULE | Freq: Every day | ORAL | Status: DC

## 2014-09-29 NOTE — Telephone Encounter (Signed)
Telephone call with patient stating she needed 2 new 30 day orders for Adderall as Medicaid would only fill 30 of her 90 day order this past month on 08/30/14.  Called [facility] with Ovid Curd, pharmacist who verified this was the case and they only filled 30 days.  Pharmacist stated they needed new 30 day orders as the rest of the 90 day order was void.  Patient will need 2 prescriptions, one to fill today, 09/29/14 and one on or after 10/29/14, 30 day orders for each.

## 2014-09-29 NOTE — Telephone Encounter (Signed)
Met with Dr. Adele Schilder who authorized and signed 2 new prescriptions for patient's prescribed Adderall, one to fill on or after today 09/29/14 and one to fill on or after 10/29/14.  Called patient to inform the 2 orders were prepared and ready for pick up .

## 2014-10-03 ENCOUNTER — Telehealth: Payer: Self-pay | Admitting: Internal Medicine

## 2014-10-03 ENCOUNTER — Ambulatory Visit (INDEPENDENT_AMBULATORY_CARE_PROVIDER_SITE_OTHER): Payer: Medicaid Other | Admitting: *Deleted

## 2014-10-03 ENCOUNTER — Telehealth: Payer: Self-pay | Admitting: Family Medicine

## 2014-10-03 DIAGNOSIS — Z3042 Encounter for surveillance of injectable contraceptive: Secondary | ICD-10-CM

## 2014-10-03 MED ORDER — MEDROXYPROGESTERONE ACETATE 150 MG/ML IM SUSP: 150.0000 mg | INTRAMUSCULAR | Status: DC

## 2014-10-03 MED ORDER — MEDROXYPROGESTERONE ACETATE 150 MG/ML IM SUSP
150.0000 mg | Freq: Once | INTRAMUSCULAR | Status: AC
  Administered 2014-10-03: 150 mg via INTRAMUSCULAR

## 2014-10-03 NOTE — Telephone Encounter (Signed)
New message     Patient calling having echo that schedule for tomorrow    Pt C/O medication issue:  1. Name of Medication: Adder all 20 mg   2. How are you currently taking this medication (dosage and times per day)? 20 mg once a day   3. Are you having a reaction (difficulty breathing--STAT)? No   4. What is your medication issue? Should medication be taken before or after test.

## 2014-10-03 NOTE — Telephone Encounter (Signed)
Rx up front for patient pick up. Patient notified. 

## 2014-10-03 NOTE — Telephone Encounter (Signed)
Ok let's do, wonder if we can put refills on these so we dont hhave to write separately every three months??

## 2014-10-03 NOTE — Telephone Encounter (Signed)
Last depo was brought in by patient and administration was paid by insurance

## 2014-10-03 NOTE — Telephone Encounter (Signed)
Spoke with patient and advised ok to take regular medications day of echo

## 2014-10-03 NOTE — Telephone Encounter (Signed)
Pt is needing her depo shot filled so that she can have it for her upcoming appt.   [employer]

## 2014-10-04 ENCOUNTER — Ambulatory Visit (INDEPENDENT_AMBULATORY_CARE_PROVIDER_SITE_OTHER): Payer: Medicaid Other | Admitting: Psychiatry

## 2014-10-04 ENCOUNTER — Ambulatory Visit (HOSPITAL_COMMUNITY): Payer: Medicaid Other | Attending: Internal Medicine

## 2014-10-04 ENCOUNTER — Other Ambulatory Visit: Payer: Self-pay

## 2014-10-04 DIAGNOSIS — I517 Cardiomegaly: Secondary | ICD-10-CM | POA: Insufficient documentation

## 2014-10-04 DIAGNOSIS — I77819 Aortic ectasia, unspecified site: Secondary | ICD-10-CM | POA: Insufficient documentation

## 2014-10-04 DIAGNOSIS — F419 Anxiety disorder, unspecified: Secondary | ICD-10-CM

## 2014-10-04 DIAGNOSIS — F3131 Bipolar disorder, current episode depressed, mild: Secondary | ICD-10-CM

## 2014-10-04 DIAGNOSIS — I358 Other nonrheumatic aortic valve disorders: Secondary | ICD-10-CM | POA: Diagnosis present

## 2014-10-04 DIAGNOSIS — I359 Nonrheumatic aortic valve disorder, unspecified: Secondary | ICD-10-CM | POA: Diagnosis not present

## 2014-10-05 NOTE — Patient Instructions (Signed)
Discussed orally 

## 2014-10-05 NOTE — Progress Notes (Signed)
    THERAPIST PROGRESS NOTE  Session Time: Wednesday 09/04/2014 2:00 PM - 3:00 PM  Participation Level: Active  Behavioral Response: CasualAlert/Anxious  Type of Therapy: Individual Therapy   Treatment Goals:    1. Maintain a pattern of regular rhythm to daily activities and a regular positive pattern of self-care       2. Patient will implement anger management skills that reduce the intensity, frequency, and duration of anger outbursts       3. Verbalize an understanding of assertive communication and how it can be used to express thoughts and feelings of anger in a controlled respectful way       4. Identify, challenge, and replace fearful and negative thoughts that contribute to anxiety       5. Learning implement calming skills to reduce overall anxiety and manage anxiety symptoms  Treatment Goals addressed:  4,5  Interventions: CBT and Supportive  Summary: Kristin Stephens is a 29 y.o. female who presents witha long-standing history of symptoms of anxiety and depression along with mood swings beginning in early adolescence. Symptoms worsened after the birth of her daughter in January 2011 as patient experienced severe depression. She continues to experience recurrent periods of depression and periods of irritability and anger.  Patient reports continued stress and tension in the relationship with her mother since last session. However, patient reports trying to avoid reacting to mother in anger. She states trying to avoid lashing out. She expressed continued frustration, anger and hurt regarding mother's expectations and comments. However, she reports some relief as mother has hired a company to [employer] in preparation for selling their home. 's mother accompanies her to the initial part of session. Patient reports increased anxiety and worry about her daughter having medical tests at [facility] in August. Her daughter has a seizure disorder and further testing involves daughter  being in the hospital so doctors can look at brain activity. Patient fears daughter may have a seizure during testing and the possible effects on daughter's future functioning. She also expresses frustration as her mother wants to accompany patient to appointment with daughter. Patient is looking forward to getting her own place but expresses anxiety about mother's deadline as daughter's medical test is scheduled for November 14, 2014.  Patient reports continued thoughts about deceased uncle and discusses some humorous memories of uncle today in session.   Suicidal/Homicidal: No  Therapist Response: Therapist works with patient to process feelings, review and revise treatment plan, review relaxation techniques, identify coping statements   Plan: Return again in 2 weeks.  Diagnosis: Axis I: Bipolar 1 disorder    Axis II: Deferred    Sierria Bruney, LCSW 10/05/2014

## 2014-10-06 ENCOUNTER — Telehealth: Payer: Self-pay | Admitting: Internal Medicine

## 2014-10-06 NOTE — Telephone Encounter (Signed)
Looking for echo results. Advised not reviewed yet.

## 2014-10-06 NOTE — Telephone Encounter (Signed)
New problem   Pt want to know results of her Echocardiogram.

## 2014-10-09 ENCOUNTER — Other Ambulatory Visit: Payer: Self-pay | Admitting: Family Medicine

## 2014-10-16 ENCOUNTER — Encounter: Payer: Self-pay | Admitting: Nurse Practitioner

## 2014-10-16 ENCOUNTER — Ambulatory Visit (INDEPENDENT_AMBULATORY_CARE_PROVIDER_SITE_OTHER): Payer: Medicaid Other | Admitting: Nurse Practitioner

## 2014-10-16 VITALS — BP 124/76 | Ht 65.0 in | Wt 205.0 lb

## 2014-10-16 DIAGNOSIS — Z113 Encounter for screening for infections with a predominantly sexual mode of transmission: Secondary | ICD-10-CM

## 2014-10-16 DIAGNOSIS — Z0000 Encounter for general adult medical examination without abnormal findings: Secondary | ICD-10-CM | POA: Diagnosis not present

## 2014-10-16 DIAGNOSIS — Z01419 Encounter for gynecological examination (general) (routine) without abnormal findings: Secondary | ICD-10-CM

## 2014-10-16 DIAGNOSIS — Z1151 Encounter for screening for human papillomavirus (HPV): Secondary | ICD-10-CM | POA: Diagnosis not present

## 2014-10-16 DIAGNOSIS — Z124 Encounter for screening for malignant neoplasm of cervix: Secondary | ICD-10-CM | POA: Diagnosis not present

## 2014-10-16 MED ORDER — CYCLOBENZAPRINE HCL 10 MG PO TABS: ORAL_TABLET | ORAL | Status: DC

## 2014-10-16 MED ORDER — HYDROCODONE-ACETAMINOPHEN 7.5-325 MG PO TABS: ORAL_TABLET | ORAL | Status: DC

## 2014-10-18 ENCOUNTER — Encounter: Payer: Self-pay | Admitting: Nurse Practitioner

## 2014-10-18 ENCOUNTER — Ambulatory Visit (HOSPITAL_COMMUNITY): Payer: Self-pay | Admitting: Psychiatry

## 2014-10-18 NOTE — Progress Notes (Signed)
   Subjective:    Patient ID: Kristin Stephens, female    DOB: 11-11-1985, 29 y.o.   MRN: [id number]  HPI presents for her wellness physical. No new sexual partners. Currently on Depo-Provera, no vaginal bleeding. Has had significant mental health issues lately, continues follow-up with psychiatry and mental health counselor. Diet has improved. Staying active. Has been working on weight loss. Regular vision and dental exams.    Review of Systems  Constitutional: Negative for fever, activity change, appetite change and fatigue.  HENT: Negative for dental problem, ear pain, sinus pressure and sore throat.   Respiratory: Negative for cough, chest tightness, shortness of breath and wheezing.   Cardiovascular: Negative for chest pain.  Gastrointestinal: Negative for nausea, vomiting, abdominal pain, diarrhea, constipation and abdominal distention.  Genitourinary: Negative for dysuria, urgency, frequency, vaginal bleeding, vaginal discharge, enuresis, difficulty urinating, genital sores, menstrual problem and pelvic pain.  Psychiatric/Behavioral: Positive for dysphoric mood. The patient is nervous/anxious.        Objective:   Physical Exam  Constitutional: She is oriented to person, place, and time. She appears well-developed. No distress.  HENT:  Right Ear: External ear normal.  Left Ear: External ear normal.  Mouth/Throat: Oropharynx is clear and moist.  Neck: Normal range of motion. Neck supple. No tracheal deviation present. No thyromegaly present.  Cardiovascular: Normal rate and regular rhythm.  Exam reveals no gallop.   Murmur heard. Chronic murmur, no change.  Pulmonary/Chest: Effort normal and breath sounds normal.  Abdominal: Soft. She exhibits no distension. There is no tenderness.  Genitourinary: Vagina normal and uterus normal. No vaginal discharge found.  External GU: No rashes or lesions. Vagina no discharge. Cervix normal limit in appearance. No CMT. Bimanual exam no  tenderness or obvious masses.  Musculoskeletal: She exhibits no edema.  Lymphadenopathy:    She has no cervical adenopathy.  Neurological: She is alert and oriented to person, place, and time.  Skin: Skin is warm and dry. No rash noted.  Psychiatric: She has a normal mood and affect. Her behavior is normal.  Vitals reviewed.  Breast exam: Dense tissue, mild fine nodularity. No dominant masses. Axilla no adenopathy.        Assessment & Plan:  Well woman exam - Plan: Pap IG, CT/NG NAA, and HPV (high risk)  Screen for STD (sexually transmitted disease) - Plan: Pap IG, CT/NG NAA, and HPV (high risk)  Screening for HPV (human papillomavirus) - Plan: Pap IG, CT/NG NAA, and HPV (high risk)  Screening for cervical cancer - Plan: Pap IG, CT/NG NAA, and HPV (high risk)  Reviewed safe sex issues. Recommend continued healthy diet and weight loss efforts. Regular activity. Return in about 3 months (around 01/16/2015) for recheck.

## 2014-10-19 LAB — PAP IG, CT-NG NAA, HPV HIGH-RISK
Chlamydia, Nuc. Acid Amp: NEGATIVE
Gonococcus by Nucleic Acid Amp: NEGATIVE
HPV, high-risk: NEGATIVE
PAP SMEAR COMMENT: 0

## 2014-10-24 ENCOUNTER — Encounter: Payer: Self-pay | Admitting: Nurse Practitioner

## 2014-10-25 ENCOUNTER — Other Ambulatory Visit (HOSPITAL_COMMUNITY): Payer: Self-pay | Admitting: Psychiatry

## 2014-10-27 ENCOUNTER — Other Ambulatory Visit (HOSPITAL_COMMUNITY): Payer: Self-pay | Admitting: Psychiatry

## 2014-10-30 ENCOUNTER — Other Ambulatory Visit (HOSPITAL_COMMUNITY): Payer: Self-pay | Admitting: Psychiatry

## 2014-10-31 ENCOUNTER — Other Ambulatory Visit (HOSPITAL_COMMUNITY): Payer: Self-pay | Admitting: Psychiatry

## 2014-10-31 DIAGNOSIS — F3131 Bipolar disorder, current episode depressed, mild: Secondary | ICD-10-CM

## 2014-10-31 MED ORDER — TRAZODONE HCL 50 MG PO TABS: 50.0000 mg | ORAL_TABLET | Freq: Every evening | ORAL | Status: DC | PRN

## 2014-10-31 MED ORDER — FLUOXETINE HCL 40 MG PO CAPS: 40.0000 mg | ORAL_CAPSULE | Freq: Every day | ORAL | Status: DC

## 2014-10-31 MED ORDER — LAMOTRIGINE 200 MG PO TABS: 200.0000 mg | ORAL_TABLET | Freq: Every day | ORAL | Status: DC

## 2014-11-01 ENCOUNTER — Ambulatory Visit (HOSPITAL_COMMUNITY): Payer: Self-pay | Admitting: Psychiatry

## 2014-11-13 ENCOUNTER — Telehealth (HOSPITAL_COMMUNITY): Payer: Self-pay

## 2014-11-13 NOTE — Telephone Encounter (Signed)
Medication management - Telephone message received from patient requesting a refill of her prescribed Xanax as was last filled on 08/30/14 with no refills and for as needed.  Patient returns for next evaluation on 11/30/14.  Left patient a message this nurse received her request and would send to Dr. Adele Schilder as he is out this date but will be back in the office on 11/14/14.  Left message this nurse would call back once Dr. Adele Schilder reviews request.

## 2014-11-20 ENCOUNTER — Ambulatory Visit (HOSPITAL_COMMUNITY): Payer: Self-pay | Admitting: Psychiatry

## 2014-11-22 NOTE — Telephone Encounter (Signed)
Medication refill requests - patient called with request for a refill of her medications as reports she had to cancel and reschedule her appointment from 11/30/14 to 12/11/14.

## 2014-11-23 ENCOUNTER — Telehealth (HOSPITAL_COMMUNITY): Payer: Self-pay

## 2014-11-23 DIAGNOSIS — F988 Other specified behavioral and emotional disorders with onset usually occurring in childhood and adolescence: Secondary | ICD-10-CM

## 2014-11-23 DIAGNOSIS — F3131 Bipolar disorder, current episode depressed, mild: Secondary | ICD-10-CM

## 2014-11-23 MED ORDER — ALPRAZOLAM 0.5 MG PO TABS: 0.5000 mg | ORAL_TABLET | Freq: Every evening | ORAL | Status: DC | PRN

## 2014-11-23 MED ORDER — AMPHETAMINE-DEXTROAMPHET ER 20 MG PO CP24: 20.0000 mg | ORAL_CAPSULE | Freq: Every day | ORAL | Status: DC

## 2014-11-23 MED ORDER — FLUOXETINE HCL 40 MG PO CAPS: 40.0000 mg | ORAL_CAPSULE | Freq: Every day | ORAL | Status: DC

## 2014-11-23 MED ORDER — LAMOTRIGINE 200 MG PO TABS: 200.0000 mg | ORAL_TABLET | Freq: Every day | ORAL | Status: DC

## 2014-11-23 MED ORDER — TRAZODONE HCL 50 MG PO TABS: 50.0000 mg | ORAL_TABLET | Freq: Every evening | ORAL | Status: DC | PRN

## 2014-11-23 NOTE — Telephone Encounter (Signed)
Met with Dr. Adele Schilder who authorized a one time refill of all patient's prescribed medications.  Called in an Alprazolam order into [facility] as patient reported she just recently moved to [facility] and this is where she wanted to begin getting medication.  Called in this one time Alprazolam order for 0.17m, #30 with no refills and then [facility] Trazodone, and Prozac.  Printed out a new Adderrall XR prescription for Dr. AAdele Schilderwho reviewed and signed to be filled on or after 11/28/14.  Called patient to inform Adderall XR prescription was prepared for pick up and other one time orders were called in or e-scribed over to her requested [facility].  Reminded patient of need to keep appointment on 12/11/14 for further refills and patient stated if she did not get by today she would pick up Adderall prescription by tomorrow. Informed this would be locked up and waiting for her at our front desk.

## 2014-11-23 NOTE — Telephone Encounter (Signed)
Medication refill request - Telephone call from patient who reported she had to rescheduled her appointment from 09/05/99 due to a conflicting appointment for her daughter with a hemotologist.  Last seen 08/30/14 and appt. now 12/11/14. Would like med refills.

## 2014-11-24 ENCOUNTER — Telehealth (HOSPITAL_COMMUNITY): Payer: Self-pay

## 2014-11-24 NOTE — Telephone Encounter (Signed)
Kristin Stephens picked up prescription on 0/60/04 Lic [id number]  dlo

## 2014-11-29 ENCOUNTER — Ambulatory Visit (INDEPENDENT_AMBULATORY_CARE_PROVIDER_SITE_OTHER): Payer: Medicaid Other | Admitting: Nurse Practitioner

## 2014-11-29 VITALS — BP 116/70 | Temp 98.0°F | Ht 65.0 in | Wt 204.0 lb

## 2014-11-29 DIAGNOSIS — J0110 Acute frontal sinusitis, unspecified: Secondary | ICD-10-CM | POA: Diagnosis not present

## 2014-11-29 MED ORDER — AMOXICILLIN-POT CLAVULANATE 875-125 MG PO TABS: 1.0000 | ORAL_TABLET | Freq: Two times a day (BID) | ORAL | Status: DC

## 2014-11-30 ENCOUNTER — Ambulatory Visit (HOSPITAL_COMMUNITY): Payer: Self-pay | Admitting: Psychiatry

## 2014-11-30 ENCOUNTER — Encounter: Payer: Self-pay | Admitting: Nurse Practitioner

## 2014-11-30 NOTE — Progress Notes (Signed)
Subjective:  Presents for complaints of cough and congestion over the past week and a half. Possible fever. Sore throat. Frontal area headache. Runny nose with postnasal drainage. Frequent cough producing green mucus. Bilateral ear pain. No wheezing.  Objective:   BP 116/70 mmHg  Temp(Src) 98 F (36.7 C) (Oral)  Ht 5\' 5"  (1.651 m)  Wt 204 lb (92.534 kg)  BMI 33.95 kg/m2 NAD. Alert, oriented. TMs significant clear effusion, no erythema. Pharynx mildly injected with slightly green PND noted. Neck supple with mild soft anterior adenopathy. Lungs clear. Heart regular rate rhythm.  Assessment: Acute frontal sinusitis, recurrence not specified  Plan:  Meds ordered this encounter  Medications  . amoxicillin-clavulanate (AUGMENTIN) 875-125 MG per tablet    Sig: Take 1 tablet by mouth 2 (two) times daily.    Dispense:  20 tablet    Refill:  0    Order Specific Question:  Supervising Provider    Answer:  Mikey Kirschner [[id number]]   Resume Flonase and Claritin as directed. Call back if worsens or persists.

## 2014-12-11 ENCOUNTER — Ambulatory Visit (HOSPITAL_COMMUNITY): Payer: Self-pay | Admitting: Psychiatry

## 2014-12-19 ENCOUNTER — Ambulatory Visit: Payer: Medicaid Other

## 2014-12-20 ENCOUNTER — Other Ambulatory Visit (HOSPITAL_COMMUNITY): Payer: Self-pay | Admitting: Psychiatry

## 2014-12-20 DIAGNOSIS — F3131 Bipolar disorder, current episode depressed, mild: Secondary | ICD-10-CM

## 2014-12-22 NOTE — Telephone Encounter (Signed)
Met with Dr. Adele Schilder who authorized a one time refill of patient's prescribed Lamictal and Prozac as patient will be out a few days prior to appointment 12/26/14.  New 30 day orders for patient's Lamictal and Prozac e-scribed to [facility] as authorized with no refills and note for no further refills until patient evaluated.

## 2014-12-25 ENCOUNTER — Telehealth: Payer: Self-pay | Admitting: General Practice

## 2014-12-25 NOTE — Telephone Encounter (Signed)
She is scheduled to see SLF on 10/13 at 3:00pm

## 2014-12-25 NOTE — Telephone Encounter (Signed)
The patient called in checking to see if we received her PA for Dexilant.  This request should come from [facility] in [place], [place].    Routing to [place] for confirmation

## 2014-12-26 ENCOUNTER — Encounter (HOSPITAL_COMMUNITY): Payer: Self-pay | Admitting: Psychiatry

## 2014-12-26 ENCOUNTER — Ambulatory Visit (INDEPENDENT_AMBULATORY_CARE_PROVIDER_SITE_OTHER): Payer: Medicaid Other | Admitting: Psychiatry

## 2014-12-26 VITALS — BP 128/84 | HR 100 | Ht 64.0 in | Wt 204.2 lb

## 2014-12-26 DIAGNOSIS — F3131 Bipolar disorder, current episode depressed, mild: Secondary | ICD-10-CM

## 2014-12-26 DIAGNOSIS — F419 Anxiety disorder, unspecified: Secondary | ICD-10-CM

## 2014-12-26 DIAGNOSIS — F909 Attention-deficit hyperactivity disorder, unspecified type: Secondary | ICD-10-CM | POA: Diagnosis not present

## 2014-12-26 DIAGNOSIS — F988 Other specified behavioral and emotional disorders with onset usually occurring in childhood and adolescence: Secondary | ICD-10-CM

## 2014-12-26 MED ORDER — AMPHETAMINE-DEXTROAMPHET ER 20 MG PO CP24: 20.0000 mg | ORAL_CAPSULE | Freq: Every day | ORAL | Status: DC

## 2014-12-26 MED ORDER — TRAZODONE HCL 50 MG PO TABS: 50.0000 mg | ORAL_TABLET | Freq: Every evening | ORAL | Status: DC | PRN

## 2014-12-26 MED ORDER — LAMOTRIGINE 200 MG PO TABS
200.0000 mg | ORAL_TABLET | Freq: Every day | ORAL | Status: DC
Start: 2014-12-26 — End: 2015-02-06

## 2014-12-26 MED ORDER — LORAZEPAM 0.5 MG PO TABS: 0.5000 mg | ORAL_TABLET | Freq: Every day | ORAL | Status: DC | PRN

## 2014-12-26 MED ORDER — FLUOXETINE HCL 40 MG PO CAPS: ORAL_CAPSULE | ORAL | Status: DC

## 2014-12-26 NOTE — Telephone Encounter (Signed)
Working on PA

## 2014-12-26 NOTE — Progress Notes (Signed)
[facility] [phone] Progress Note  Kristin Stephens [id number] 29 y.o.  12/26/2014 5:09 PM  Chief Complaint:  I have been more anxious and nervous.  I am more depressed because I'm living alone.      History of Present Illness:  Kristin Stephens came for her follow-up appointment.  She moved from [place] and now living in [place].  She told house was sold .  Patient told since her uncle died her mother decided to sell the house and within a month and he was sold.  She moved [place] and living by herself.  Her 94-year-old daughter is living with her sister .  Patient told she feels sad and miss her daughter .  Her family threatening her that she cannot take care of her daughter due to her psychiatric illness and in February when she was in intensive outpatient program she had decided that her sister can keep her daughter.  Now she is very concerned because if she decided to have her daughter back they may resist .  She does not like that but she has no other choice.  She is trying to adjust herself by living alone.  She is thinking about getting job.  She is taking her medication but sometimes she feels very nervous anxious and having poor sleep.  She is taking Xanax but it is making her very sleepy and groggy.  She wants to try something different.  She also has not seen Maurice Small since she moved [place].  She admitted that she need to see some therapist for coping skills.  She has seen therapist most of her life and now she feels that she is slipping back into depression.  She admitted racing thoughts, nervousness, feeling overwhelmed but denies any active or passive suicidal parts or homicidal thought.  She denies any hallucination or any paranoia.  She denies any self abusive behavior.  Her energy level is fair.  She is enrolled in [facility] but like to keep herself busy also doing job.  Patient denies drinking or using any illegal substances.  She visited on the weekends to see her daughter  who lives in [place].  She is compliant with Adderall, Lamictal, Prozac,  trazodone.  She has no tremors, shakes or any side effects.  Suicidal Ideation: No Plan Formed: No Patient has means to carry out plan: No  Homicidal Ideation: No Plan Formed: No Patient has means to carry out plan: No  Medical History; Patient has history of migraine headache, GERD, allergic rhinitis, hypertension, irritable bowel syndrome and aortic valve repaired.  She has history of ASD and VSD repair, Coaraction of aorta repair, cardiac catheterization and flexible sigmoidoscopy.  Past Psychiatric History/Hospitalization(s) Patient endorse history of mood swing, depression, anger and poor attention concentration.  She is diagnosed with bipolar disorder and ADD.  She has history of self abusive behavior in the past and she remember cutting herself with a razor blade when her grandmother died.  She has not done any self abusive behavior in a while.  She was seen this Probation officer until April 2012.  After that she was seeing Dr. Harrington Challenger in [place].  In the past she had tried Wellbutrin.  She attended intensive outpatient program in February 2016. Anxiety: No Bipolar Disorder: Yes Depression: Yes Mania: Yes Psychosis: No Schizophrenia: No Personality Disorder: No Hospitalization for psychiatric illness: No History of Electroconvulsive Shock Therapy: No Prior Suicide Attempts: Patient has self abusive behavior.   Review of Systems  Constitutional: Negative.   Cardiovascular:  Negative for chest pain and palpitations.  Gastrointestinal: Negative for nausea and vomiting.  Musculoskeletal: Negative.   Skin: Negative for itching and rash.  Neurological: Positive for headaches. Negative for dizziness and tremors.  Psychiatric/Behavioral: The patient is nervous/anxious and has insomnia.     Psychiatric: Agitation: No Hallucination: No Depressed Mood: No Insomnia: Yes Hypersomnia: No Altered Concentration: No Feels  Worthless: No Grandiose Ideas: No Belief In Special Powers: No New/Increased Substance Abuse: No Compulsions: No  Neurologic: Headache: Yes Seizure: No Paresthesias: No   Musculoskeletal: Strength & Muscle Tone: within normal limits Gait & Station: normal Patient leans: N/A  Outpatient Encounter Prescriptions as of 12/26/2014  Medication Sig  . acetaminophen (TYLENOL) 500 MG tablet Take 500 mg by mouth every 6 (six) hours as needed for moderate pain or headache.   . albuterol (PROVENTIL HFA;VENTOLIN HFA) 108 (90 BASE) MCG/ACT inhaler Inhale 1-2 puffs into the lungs every 6 (six) hours as needed for wheezing.  Marland Kitchen amphetamine-dextroamphetamine (ADDERALL XR) 20 MG 24 hr capsule Take 1 capsule (20 mg total) by mouth daily.  . beclomethasone (QVAR) 80 MCG/ACT inhaler Inhale 2 puffs into the lungs 2 (two) times daily.  . clobetasol cream (TEMOVATE) 0.93 % Apply 1 application topically 2 (two) times daily as needed (eczema rash).  . cyclobenzaprine (FLEXERIL) 10 MG tablet TAKE ONE TABLET BY MOUTH AT BEDTIME AS NEEDED FOR MUSCLE SPASMS.  . cycloSPORINE (RESTASIS) 0.05 % ophthalmic emulsion Place 1 drop into both eyes 2 (two) times daily.  Marland Kitchen DEXILANT 60 MG capsule TAKE 1 CAPSULE BY MOUTH ONCE DAILY.  Marland Kitchen FLUoxetine (PROZAC) 40 MG capsule TAKE 1 CAPSULE BY MOUTH EVERY DAY  . fluticasone (FLONASE) 50 MCG/ACT nasal spray INSTILL 2 SPRAYS INTO EACH NOSTRIL DAILY.  Marland Kitchen gabapentin (NEURONTIN) 300 MG capsule Two po in the morning; two po in the afternoon; three po in the evening (Patient taking differently: Take 600-900 mg by mouth See admin instructions. Two po in the morning; two po in the afternoon; three po in the evening)  . HYDROcodone-acetaminophen (NORCO) 7.5-325 MG per tablet One po qd prn pain  . ibuprofen (ADVIL,MOTRIN) 400 MG tablet Take 400 mg by mouth 3 (three) times daily.  Marland Kitchen lamoTRIgine (LAMICTAL) 200 MG tablet Take 1 tablet (200 mg total) by mouth at bedtime.  Marland Kitchen loratadine (CLARITIN) 10  MG tablet Take 10 mg by mouth daily as needed for allergies.   Marland Kitchen LORazepam (ATIVAN) 0.5 MG tablet Take 1 tablet (0.5 mg total) by mouth daily as needed for anxiety.  . metoCLOPramide (REGLAN) 10 MG tablet Take 1 tablet (10 mg total) by mouth 3 (three) times daily as needed for nausea (headache / nausea).  . traZODone (DESYREL) 50 MG tablet Take 1 tablet (50 mg total) by mouth at bedtime as needed for sleep.  . [DISCONTINUED] ALPRAZolam (XANAX) 0.5 MG tablet Take 1 tablet (0.5 mg total) by mouth at bedtime as needed for anxiety.  . [DISCONTINUED] amoxicillin-clavulanate (AUGMENTIN) 875-125 MG per tablet Take 1 tablet by mouth 2 (two) times daily.  . [DISCONTINUED] amphetamine-dextroamphetamine (ADDERALL XR) 20 MG 24 hr capsule Take 1 capsule (20 mg total) by mouth daily.  . [DISCONTINUED] amphetamine-dextroamphetamine (ADDERALL XR) 20 MG 24 hr capsule Take 1 capsule (20 mg total) by mouth daily.  . [DISCONTINUED] DiphenhydrAMINE HCl (BENADRYL ALLERGY PO) Take 1 tablet by mouth daily as needed (migraines). Takes in combination with reglan and toradol.  . [DISCONTINUED] FLUoxetine (PROZAC) 40 MG capsule TAKE 1 CAPSULE BY MOUTH EVERY DAY  . [DISCONTINUED]  ketorolac (TORADOL) 10 MG tablet Take 10 mg by mouth daily as needed (migraines). Takes in combination with benadryl and reglan for migraines.  . [DISCONTINUED] lamoTRIgine (LAMICTAL) 200 MG tablet TAKE 1 TABLET BY MOUTH AT BEDTIME  . [DISCONTINUED] traZODone (DESYREL) 50 MG tablet Take 1 tablet (50 mg total) by mouth at bedtime as needed for sleep.   No facility-administered encounter medications on file as of 12/26/2014.    Recent Results (from the past 2160 hour(s))  Pap IG, CT/NG NAA, and HPV (high risk)     Status: None   Collection Time: 10/16/14 12:00 AM  Result Value Ref Range   DIAGNOSIS: Comment     Comment: NEGATIVE FOR INTRAEPITHELIAL LESION AND MALIGNANCY. FUNGAL ORGANISMS MORPHOLOGICALLY CONSISTENT WITH CANDIDA SPECIES  ARE PRESENT.    Specimen adequacy: Comment     Comment: Satisfactory for evaluation. Endocervical and/or squamous metaplastic cells (endocervical component) are present.    CLINICIAN PROVIDED ICD10: Comment     Comment: Z00.00 Z11.3 Z11.51 Z12.4    Performed by: Comment     Comment: Claudie Fisherman, Cytotechnologist (ASCP)   PAP SMEAR COMMENT .    PATHOLOGIST PROVIDED ICD10: Comment     Comment: R87.5   Note: Comment     Comment: The Pap smear is a screening test designed to aid in the detection of premalignant and malignant conditions of the uterine cervix.  It is not a diagnostic procedure and should not be used as the sole means of detecting cervical cancer.  Both false-positive and false-negative reports do occur.    Test Methodology Comment     Comment: This liquid based ThinPrep(R) pap test was screened with the use of an image guided system.    HPV, high-risk Negative Negative    Comment: This high-risk HPV test detects thirteen high-risk types (16/18/31/33/35/39/45/51/52/56/58/59/68) without differentiation.    Chlamydia, Nuc. Acid Amp Negative Negative   Gonococcus by Nucleic Acid Amp Negative Negative    Physical Exam: Consitutional ;  BP 128/84 mmHg  Pulse 100  Ht 5\' 4"  (1.626 m)  Wt 204 lb 3.2 oz (92.625 kg)  BMI 35.03 kg/m2  Mental Status Examination;  Patient is casually dressed and groomed.  She is sad and anxious but cooperative.  She maintained fair eye contact.  Her speech is slow but clear and coherent.  Her thought processes logical and goal-directed.  She described her mood depressed and anxious.  Her affect is constricted.  There were no delusions, paranoia or any obsessive thoughts.  She denies any active or passive suicidal parts or homicidal thought.  Her psychomotor activity is normal.  She has no tremors, shakes or any EPS symptoms.  Her attention concentration is fair.  Her fund of knowledge is adequate.  There were no flight of ideas or any loose  association.  Her cognition is good.  She is alert and oriented 3.  Her insight judgment and impulse control is okay.   Established Problem, Stable/Improving (1), Review of Psycho-Social Stressors (1), Review and summation of old records (2), Established Problem, Worsening (2), New Problem, with no additional work-up planned (3), Review of Last Therapy Session (1), Review of Medication Regimen & Side Effects (2) and Review of New Medication or Change in Dosage (2)  Assessment: Axis I: Bipolar disorder depressed type, ADHD, anxiety disorder  Axis II: Deferred  Axis III:  Past Medical History  Diagnosis Date  . Esophageal reflux   . IBS (irritable bowel syndrome)   . Anxiety   . Depression   .  Bipolar affective disorder   . TMJ syndrome   . Asthma     daily and prn inhalers  . Deviated nasal septum 05/2012  . Nasal turbinate hypertrophy 05/2012    bilateral  . Dental crowns present   . Eczema     right leg  . Migraine headache   . Hypertension   . ADHD (attention deficit hyperactivity disorder)   . Fibromyalgia     Plan:  I had a long discussion with the patient about her symptoms and stressors.  Despite taking multiple medication she continues to have depression and anxiety and sadness.  She missed her daughter who now living in [place] with patient's sister.  Patient is living by herself.  Encouraged to look for a job to keep herself busy.  I do believe patient requires counseling which she has done most of her life.  Since she moved to [facility] she has not able to see Peggy.  We will schedule appointment with a therapist in this office.  Recommended to discontinue Xanax since it is making her very sleepy.  We will start lorazepam 0.5 mg as needed for anxiety.  She has a good response with lorazepam in the past.  Continue Lamictal 200 mg daily, Prozac 40 mg daily, trazodone 50 mg at bedtime, Adderall 20 mg daily.  Discussed benzodiazepine dependence, tolerance and withdrawal.   Discussed stimulant abuse .  Patient does not ask early refills for her medication.  Encouraged to keep appointment with a therapist in this office for coping and social skills.  I will see her again in 6 weeks.  Discuss safety plan that anytime having active suicidal thoughts or homicidal thoughts and she need to call 911 or go to the local emergency room.  Chrisy Hillebrand T., MD 12/26/2014

## 2014-12-28 ENCOUNTER — Ambulatory Visit (HOSPITAL_COMMUNITY): Payer: Self-pay | Admitting: Psychiatry

## 2015-01-11 ENCOUNTER — Ambulatory Visit (INDEPENDENT_AMBULATORY_CARE_PROVIDER_SITE_OTHER): Payer: Medicaid Other | Admitting: Gastroenterology

## 2015-01-11 ENCOUNTER — Encounter: Payer: Self-pay | Admitting: Gastroenterology

## 2015-01-11 VITALS — BP 132/81 | HR 105 | Temp 98.0°F | Ht 65.0 in | Wt 201.2 lb

## 2015-01-11 DIAGNOSIS — K219 Gastro-esophageal reflux disease without esophagitis: Secondary | ICD-10-CM

## 2015-01-11 DIAGNOSIS — K602 Anal fissure, unspecified: Secondary | ICD-10-CM

## 2015-01-11 DIAGNOSIS — K589 Irritable bowel syndrome without diarrhea: Secondary | ICD-10-CM

## 2015-01-11 DIAGNOSIS — K648 Other hemorrhoids: Secondary | ICD-10-CM

## 2015-01-11 NOTE — Progress Notes (Signed)
Subjective:    Patient ID: Kristin Stephens, female    DOB: [birth year], 29 y.o.   MRN: [id number]  Mickie Hillier, MD  HPI Having the SQUIRTS FOR PAST > 2-3 MOS. BMs: GOOD DAY: 2, BAD DAY: 5. CAN BE BSC-RARE(2/3), SOMETIMES(4), AND FREQUENTLY 5 OR 6. MOVED OUT WITH MOM NOW ON HER OWN. UNCLE DIED WHO WAS HER BEST FRIEND. DAUGHTER IS WITH HER MOTHER(AGE 41). NOW ON PROZAC. AND SAW CHANGE INFREQUENTLY OF STOOLS. PROZAC HELPS WITH HER BIPOLAR DISORDER. OCCASIONAL BRBPR AND MUCOUS. DAUGHTER HAS SEIZURE DISORDER SINCE AGE 77. HAS TO TAKE HER BACK AND FORTH TO [facility]. SHE IS ON TOPAMAX AND DOING WELL. WORKUP FOR ENLARGED SPLEEN. RARE ABD CRAMPS.  PT DENIES FEVER, CHILLS, HEMATEMESIS, nausea, vomiting, melena, CHEST PAIN, SHORTNESS OF BREATH, CHANGE IN BOWEL IN HABITS, constipation, abdominal pain, problems swallowing, OR heartburn or indigestion.  Past Surgical History  Procedure Laterality Date  . Asd and vsd repair  1989  . Coarctation of aorta repair  1989  . Cesarean section  04/12/2009  . Esophagogastroduodenoscopy  04/04/2008      Normal esophagus/Mild patchy erythema in the antrum/ Normal duodenal bulb, normal small bowel biopsy  . Flexible sigmoidoscopy  04/04/2008      Small internal hemorrhoids (poor bowel prep)  . Cardiac catheterization  06/18/2006  . Nasal septoplasty w/ turbinoplasty Bilateral 05/31/2012    Procedure: NASAL SEPTOPLASTY WITH TURBINATE REDUCTION;  Surgeon: Ascencion Dike, MD;  Location: [facility];  Service: ENT;  Laterality: Bilateral;  . Colonoscopy N/A 05/20/2013    Procedure: COLONOSCOPY;  Surgeon: Danie Binder, MD;  [facility];  Service: Endoscopy;  Laterality: N/A;  115-moved to [place] notified pt  . Hemorrhoid banding N/A 05/20/2013    Procedure: HEMORRHOID BANDING;  Surgeon: Danie Binder, MD;  [facility];  Service: Endoscopy;  Laterality: N/A;   Past Surgical History  Procedure Laterality Date  . Asd and vsd repair   1989  . Coarctation of aorta repair  1989  . Cesarean section  04/12/2009  . Esophagogastroduodenoscopy  04/04/2008      Normal esophagus/Mild patchy erythema in the antrum/ Normal duodenal bulb, normal small bowel biopsy  . Flexible sigmoidoscopy  04/04/2008      Small internal hemorrhoids (poor bowel prep)  . Cardiac catheterization  06/18/2006  . Nasal septoplasty w/ turbinoplasty Bilateral 05/31/2012      . Colonoscopy N/A 05/20/2013      . Hemorrhoid banding N/A 05/20/2013    Procedure: HEMORRHOID BANDING;  Surgeon: Danie Binder, MD;  [facility];  Service: Endoscopy;  Laterality: N/A;   Allergies  Allergen Reactions  . Sulfonamide Derivatives Swelling    SWELLING OF EYES WITH OPHTHALMIC SULFA  . Coconut Flavor Rash   Current Outpatient Prescriptions  Medication Sig Dispense Refill  . acetaminophen (TYLENOL) 500 MG tablet Take 500 mg by mouth every 6 (six) hours as needed for moderate pain or headache.     . albuterol (PROVENTIL HFA;VENTOLIN HFA) 108 (90 BASE) MCG/ACT inhaler Inhale 1-2 puffs into the lungs every 6 (six) hours as needed for wheezing.    Marland Kitchen amphetamine-dextroamphetamine (ADDERALL XR) 20 MG 24 hr capsule Take 1 capsule (20 mg total) by mouth daily.    . beclomethasone (QVAR) 80 MCG/ACT inhaler Inhale 2 puffs into the lungs 2 (two) times daily.    . cyclobenzaprine (FLEXERIL) 10 MG tablet TAKE ONE TABLET BY MOUTH AT BEDTIME AS NEEDED FOR  MUSCLE SPASMS.    . cycloSPORINE (RESTASIS) 0.05 % ophthalmic emulsion Place 1 drop into both eyes 2 (two) times daily.    Marland Kitchen DEXILANT 60 MG capsule TAKE 1 CAPSULE BY MOUTH ONCE DAILY.    Marland Kitchen FLUoxetine (PROZAC) 40 MG capsule TAKE 1 CAPSULE BY MOUTH EVERY DAY    . fluticasone (FLONASE) 50 MCG/ACT nasal spray INSTILL 2 SPRAYS INTO EACH NOSTRIL DAILY.    Marland Kitchen gabapentin (NEURONTIN) 300 MG capsule Two po in the morning; two po in the afternoon; three po in the evening     . HYDROcodone-acetaminophen (NORCO) 7.5-325 MG per tablet One  po qd prn pain PRN   . lamoTRIgine (LAMICTAL) 200 MG tablet Take 1 tablet (200 mg total) by mouth at bedtime.    Marland Kitchen LORazepam (ATIVAN) 0.5 MG tablet Take 1 tablet (0.5 mg total) by mouth daily as needed for anxiety. PRN   . metoCLOPramide (REGLAN) 10 MG tablet Take 1 tablet (10 mg total) by mouth 3 (three) times daily as needed for nausea (headache / nausea). PRN FOR HA   . traZODone (DESYREL) 50 MG tablet Take 1 tablet (50 mg total) by mouth at bedtime as needed for sleep.    . clobetasol cream (TEMOVATE) 0.30 % Apply 1 application topically 2 (two) times daily as needed (eczema rash).    Marland Kitchen ibuprofen (ADVIL,MOTRIN) 400 MG tablet Take 400 mg by mouth 3 (three) times daily.    Marland Kitchen loratadine (CLARITIN) 10 MG tablet Take 10 mg by mouth daily as needed for allergies.      Review of Systems PER HPI OTHERWISE ALL SYSTEMS ARE NEGATIVE.    Objective:   Physical Exam  Constitutional: She is oriented to person, place, and time. She appears well-developed and well-nourished. No distress.  HENT:  Head: Normocephalic and atraumatic.  Mouth/Throat: Oropharynx is clear and moist. No oropharyngeal exudate.  Eyes: Pupils are equal, round, and reactive to light. No scleral icterus.  Neck: Normal range of motion. Neck supple.  Cardiovascular: Normal rate, regular rhythm and normal heart sounds.   Pulmonary/Chest: Effort normal and breath sounds normal. No respiratory distress.  Abdominal: Soft. Bowel sounds are normal. She exhibits no distension. There is no tenderness.  Musculoskeletal: She exhibits no edema.  Lymphadenopathy:    She has no cervical adenopathy.  Neurological: She is alert and oriented to person, place, and time.  NO FOCAL DEFICITS  Psychiatric: She has a normal mood and affect.  Vitals reviewed.     Assessment & Plan:

## 2015-01-11 NOTE — Assessment & Plan Note (Signed)
SYMPTOMS FAIRLY WELL CONTROLLED & INTERMITTENT LOOSE STOOLS MOST LIKELY DUE TO MEDS.  CONTINUE TO MONITOR SYMPTOMS. PROBIOTIC DAILY FOLLOW UP IN 6 MOS.

## 2015-01-11 NOTE — Assessment & Plan Note (Signed)
SYMPTOMS CONTROLLED/RESOLVED.  CONTINUE DEXILANT AVOID TRIGGERS FOR REFLUX. FOLLOW UP IN 6 MOS.

## 2015-01-11 NOTE — Assessment & Plan Note (Signed)
SYMPTOMS CONTROLLED/RESOLVED.  CONTINUE TO MONITOR SYMPTOMS. 

## 2015-01-11 NOTE — Assessment & Plan Note (Signed)
OCCASIONAL BRBRPR  CONTINUE TO MONITOR SYMPTOMS. FOLLOW UP IN 6 MOS.

## 2015-01-11 NOTE — Patient Instructions (Addendum)
DRINK WATER TO KEEP YOUR URINE LIGHT YELLOW.  FOLLOW A HIGH FIBER DIET.  AVOID ITEMS THAT CAUSE BLOATING & GAS. SEE INFO BELOW.  CONTINUE DEXILANT.  TAKE A PROBIOTIC DAILY FOR THREE MONTHS ([employer] BRAND, [place]). STOP AFTER 3 MOS IF YOU FEEL IT'S NOT MAKING A DIFFERENCE.  FOLLOW UP IN 6 MOS.   PLEASE CALL WITH QUESTIONS OR CONCERNS RE: RECTAL BLEEDING OR IBS.

## 2015-01-12 NOTE — Progress Notes (Signed)
CC'D TO PCP °

## 2015-01-12 NOTE — Progress Notes (Signed)
CC'ED TO PCP 

## 2015-01-16 ENCOUNTER — Ambulatory Visit: Payer: Medicaid Other | Admitting: Nurse Practitioner

## 2015-01-17 ENCOUNTER — Encounter: Payer: Self-pay | Admitting: Nurse Practitioner

## 2015-01-17 ENCOUNTER — Ambulatory Visit (INDEPENDENT_AMBULATORY_CARE_PROVIDER_SITE_OTHER): Payer: Medicaid Other | Admitting: Nurse Practitioner

## 2015-01-17 VITALS — BP 132/70 | Ht 65.0 in | Wt 202.0 lb

## 2015-01-17 DIAGNOSIS — M797 Fibromyalgia: Secondary | ICD-10-CM | POA: Diagnosis not present

## 2015-01-17 DIAGNOSIS — Z30013 Encounter for initial prescription of injectable contraceptive: Secondary | ICD-10-CM | POA: Diagnosis not present

## 2015-01-17 MED ORDER — HYDROCODONE-ACETAMINOPHEN 7.5-325 MG PO TABS: ORAL_TABLET | ORAL | Status: DC

## 2015-01-17 MED ORDER — GABAPENTIN 300 MG PO CAPS: ORAL_CAPSULE | ORAL | Status: DC

## 2015-01-17 MED ORDER — MEDROXYPROGESTERONE ACETATE 150 MG/ML IM SUSP: 150.0000 mg | INTRAMUSCULAR | Status: DC

## 2015-01-18 ENCOUNTER — Ambulatory Visit (INDEPENDENT_AMBULATORY_CARE_PROVIDER_SITE_OTHER): Payer: Medicaid Other

## 2015-01-18 DIAGNOSIS — Z3042 Encounter for surveillance of injectable contraceptive: Secondary | ICD-10-CM | POA: Diagnosis not present

## 2015-01-18 DIAGNOSIS — N912 Amenorrhea, unspecified: Secondary | ICD-10-CM | POA: Diagnosis not present

## 2015-01-18 LAB — POCT URINE PREGNANCY: PREG TEST UR: NEGATIVE

## 2015-01-18 MED ORDER — MEDROXYPROGESTERONE ACETATE 150 MG/ML IM SUSP
150.0000 mg | Freq: Once | INTRAMUSCULAR | Status: AC
  Administered 2015-01-18: 150 mg via INTRAMUSCULAR

## 2015-01-22 ENCOUNTER — Encounter: Payer: Self-pay | Admitting: Nurse Practitioner

## 2015-01-22 NOTE — Progress Notes (Signed)
Subjective:   Presents for routine follow-up on her chronic pain and fibromyalgia. Is taking her Neurontin slightly differently than prescribed. Now taking 300 mg 2 in the morning 1 in the afternoon and 2 at bedtime  Which is working extremely well. Limits her hydrocodone to no more than 20 pills every 3 months. Would also like to restart her Depo-Provera for her birth control. No new sexual partners.  Objective:   BP 132/70 mmHg  Ht 5\' 5"  (1.651 m)  Wt 202 lb (91.627 kg)  BMI 33.61 kg/m2  NAD. Alert, oriented. Lungs clear. Heart regular rate rhythm.  Assessment:  Problem List Items Addressed This Visit      Musculoskeletal and Integument   Fibromyalgia - Primary (Chronic)    Other Visit Diagnoses    Encounter for initial prescription of injectable contraceptive            Plan:  Meds ordered this encounter  Medications  . gabapentin (NEURONTIN) 300 MG capsule    Sig: 2 po qam; one in the afternoon; 2 in the evening    Dispense:  150 capsule    Refill:  5    Order Specific Question:  Supervising Provider    Answer:  Mikey Kirschner [[id number]]  . HYDROcodone-acetaminophen (NORCO) 7.5-325 MG tablet    Sig: One po qd prn pain    Dispense:  20 tablet    Refill:  0    Order Specific Question:  Supervising Provider    Answer:  Mikey Kirschner [[id number]]  . DISCONTD: medroxyPROGESTERone (DEPO-PROVERA) 150 MG/ML injection    Sig: Inject 1 mL (150 mg total) into the muscle every 3 (three) months.    Dispense:  1 mL    Refill:  3    Order Specific Question:  Supervising Provider    Answer:  Mikey Kirschner [[id number]]  . medroxyPROGESTERone (DEPO-PROVERA) 150 MG/ML injection    Sig: Inject 1 mL (150 mg total) into the muscle every 3 (three) months.    Dispense:  1 mL    Refill:  3    limit use of hydrocodone. Discussed addiction potential. Prescription sent in for Depo-Provera, patient to return to the office tomorrow with medication for a urine pregnancy test and administration of  medication. Return in about 3 months (around 04/19/2015) for recheck.  Call back sooner if any problems.

## 2015-01-24 ENCOUNTER — Other Ambulatory Visit (HOSPITAL_COMMUNITY): Payer: Self-pay | Admitting: Psychiatry

## 2015-01-25 ENCOUNTER — Telehealth (HOSPITAL_COMMUNITY): Payer: Self-pay

## 2015-01-25 ENCOUNTER — Other Ambulatory Visit (HOSPITAL_COMMUNITY): Payer: Self-pay | Admitting: Psychiatry

## 2015-01-25 NOTE — Telephone Encounter (Signed)
Telephone message left for patient following up on her message requesting a call back about her Adderall.  Informed patient on message she should have another order to fill on 01/28/15 and to call us back if any problems.

## 2015-01-25 NOTE — Telephone Encounter (Signed)
Given on September 27 with one more additional refill.  No early refill.

## 2015-02-06 ENCOUNTER — Ambulatory Visit (INDEPENDENT_AMBULATORY_CARE_PROVIDER_SITE_OTHER): Payer: Medicaid Other | Admitting: Psychiatry

## 2015-02-06 ENCOUNTER — Encounter (HOSPITAL_COMMUNITY): Payer: Self-pay | Admitting: Psychiatry

## 2015-02-06 VITALS — BP 127/84 | HR 101 | Ht 65.0 in | Wt 201.8 lb

## 2015-02-06 DIAGNOSIS — F909 Attention-deficit hyperactivity disorder, unspecified type: Secondary | ICD-10-CM

## 2015-02-06 DIAGNOSIS — F419 Anxiety disorder, unspecified: Secondary | ICD-10-CM

## 2015-02-06 DIAGNOSIS — F988 Other specified behavioral and emotional disorders with onset usually occurring in childhood and adolescence: Secondary | ICD-10-CM

## 2015-02-06 DIAGNOSIS — F3131 Bipolar disorder, current episode depressed, mild: Secondary | ICD-10-CM

## 2015-02-06 MED ORDER — FLUOXETINE HCL 40 MG PO CAPS: ORAL_CAPSULE | ORAL | Status: DC

## 2015-02-06 MED ORDER — AMPHETAMINE-DEXTROAMPHET ER 20 MG PO CP24: 20.0000 mg | ORAL_CAPSULE | Freq: Every day | ORAL | Status: DC

## 2015-02-06 MED ORDER — AMPHETAMINE-DEXTROAMPHET ER 20 MG PO CP24
20.0000 mg | ORAL_CAPSULE | Freq: Every day | ORAL | Status: DC
Start: 2015-02-06 — End: 2015-03-16

## 2015-02-06 MED ORDER — LAMOTRIGINE 200 MG PO TABS: 200.0000 mg | ORAL_TABLET | Freq: Every day | ORAL | Status: DC
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
## 2015-02-25 ENCOUNTER — Other Ambulatory Visit (HOSPITAL_COMMUNITY): Payer: Self-pay | Admitting: Psychiatry

## 2015-02-25 DIAGNOSIS — F3131 Bipolar disorder, current episode depressed, mild: Secondary | ICD-10-CM

## 2015-02-25 MED ORDER — TRAZODONE HCL 50 MG PO TABS: 50.0000 mg | ORAL_TABLET | Freq: Every evening | ORAL | Status: DC | PRN

## 2015-03-01 ENCOUNTER — Ambulatory Visit (INDEPENDENT_AMBULATORY_CARE_PROVIDER_SITE_OTHER): Payer: Medicaid Other | Admitting: Psychiatry

## 2015-03-01 DIAGNOSIS — F3131 Bipolar disorder, current episode depressed, mild: Secondary | ICD-10-CM

## 2015-03-02 NOTE — Progress Notes (Signed)
   THERAPIST PROGRESS NOTE  Session Time: 1:05-1:55  Participation Level: Active  Behavioral Response: CasualAlertEuthymic  Type of Therapy: Individual Therapy  Treatment Goals addressed: Anger/Assertive Communication Skills  Interventions: CBT and Strength-based  Summary: Kristin Stephens is a 29 y.o. female who presents with Bipolar Disorder.   Suicidal/Homicidal: Nowithout intent/plan  Therapist Response: Pt. Presents with euthymic mood. Pt.'s first session since seeing Maurice Small in July 2016. Pt. Reviewed previous treatment goals including regular pattern of self-care, implementing anger management skills, assertive communication skills, replacing negative thought patterns with positive constructive thoughts, and learning relaxation and calming skills. Pt. 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 Pt. Reports that she has an apartment and is living independently. Pt. Reports that major life goal is to regain permanent custody of her daughter who is currently living with her sister and brother-in-law in [place]. Pt. Reported that she has to work on anger management because her brother in law is a trigger for her anger and often feels that "my efforts are never good enough" regarding her family's evaluation of her recovery and progress in her life. Pt. Reports as significant strengths finding a job that she enjoys that reflect her interest in criminal justice, living independently. Pt. Also processing grief related to loss of her uncle who was a major source of support. Session focused on review of goals, processing of grief, and life review since discharge from group.   Plan: Pt. To continue with strength-based, CBT therapy. Return again in 2 weeks.  Diagnosis: Axis I: Bipolar, Depressed    Axis II: No diagnosis    Kristin Stephens, [facility] 03/02/2015

## 2015-03-15 ENCOUNTER — Ambulatory Visit (INDEPENDENT_AMBULATORY_CARE_PROVIDER_SITE_OTHER): Payer: Medicaid Other | Admitting: Psychiatry

## 2015-03-15 ENCOUNTER — Telehealth (HOSPITAL_COMMUNITY): Payer: Self-pay

## 2015-03-15 DIAGNOSIS — F3131 Bipolar disorder, current episode depressed, mild: Secondary | ICD-10-CM

## 2015-03-15 DIAGNOSIS — F988 Other specified behavioral and emotional disorders with onset usually occurring in childhood and adolescence: Secondary | ICD-10-CM

## 2015-03-15 NOTE — Telephone Encounter (Signed)
Medication refill request - Patient left a message she had mistakenly thrown her bottle of Adderall and Prozac away when cleaning things out on 03/14/15 and was requesting a refill of the orders.  Pt. stated both were about 1/2 full.  Agreed to inform Dr. Adele Schilder and will contact her back if he is willing to fill again earlier.

## 2015-03-16 ENCOUNTER — Telehealth (HOSPITAL_COMMUNITY): Payer: Self-pay

## 2015-03-16 MED ORDER — AMPHETAMINE-DEXTROAMPHET ER 20 MG PO CP24: 20.0000 mg | ORAL_CAPSULE | Freq: Every day | ORAL | Status: DC

## 2015-03-16 MED ORDER — FLUOXETINE HCL 40 MG PO CAPS: ORAL_CAPSULE | ORAL | Status: DC

## 2015-03-16 NOTE — Telephone Encounter (Signed)
Met with Dr. Adele Schilder who approved a new order for patient's Adderall and Prozac due to patient throwing them out while cleaning up.  Dr. Adele Schilder reported patient with no history of misuse so e-scribed a new Prozac order to patient's [facility] as approved for one time refill.  Printed out a new Adderall prescription but Dr. Adele Schilder had left without signing so Dr. Salem Senate approved and second order prepared that she reviewed and signed and other order voided and discarded.   Called patient to inform new orders were approved and would leave Adderall order at front reception.  Informed e-scribed in new Prozac order but warned not sure her insurance would cover costs of refills early and patient stated she understood and would manage this issue with increased cost if needed.  Adderall order left at front reception for pick up.

## 2015-03-16 NOTE — Telephone Encounter (Signed)
Met with Kristin Stephens, pharmacist at [facility] who reported getting a call from patient that [facility] would not refill patient's Adderall and Prozac early after patient reported loosing both bottles of each medication cleaning out her house.  [place] and spoke with Kristin Stephens to verify Dr. Adele Stephens approved patient getting a new Prozac order and Adderall order early due to lost medication.  Informed Prozac order e-scribed in under Dr. Adele Stephens to them today and Dr. Salem Stephens actually signed new Adderall order as Dr. Adele Stephens left, forgetting to sign original order.  Informed MD aware and approved the early refills but instructed patient she may have to pay the full cost. Also informed patient should have another Adderall prescription to fill after 03/30/15 but should now be filled in 30 days.  Called patient back to inform this nurse had spoken to her [place] and they were filling orders as approved by Dr. Adele Stephens.

## 2015-03-16 NOTE — Progress Notes (Signed)
   THERAPIST PROGRESS NOTE  Session Time: 3:05-3:55  Participation Level: Active  Behavioral Response: CasualAlertEuthymic  Type of Therapy: Individual Therapy  Treatment Goals addressed: Anger/Assertive Communication Skills; life planning  Interventions: CBT and Strength-based  Summary: Kristin Stephens is a 29 y.o. female who presents with Bipolar Disorder.   Suicidal/Homicidal: Nowithout intent/plan  Therapist Response: Pt. Continues to present with euthymic mood. Pt. Reports that her work continues to go well and she enjoys her co-workers. Pt. Reports that her mood has been consistently and that she is tolerating her medication well. Pt. Reports that her relationship with her mother is good. In response to question from counselor regarding what she would like to be different in her life, Pt. Responds "to have my daughter back". Pt. Reports that her plan is to allow her daughter to complete the school year in [place] with her sister and brother-in-law and plan for her to return to live with her in the summer. Pt. Reports that she has looked at schools and is happy with the schools in her district and is planning for after school care that she can arrange with her work schedule. Pt. Reports that she continues to have a positive relationship with her daughter's father and she plans to spend time with her daughter when she comes for Christmas. Pt. Reports that her self-care is good, she is sleeping well, has a good appetite, and maintains physical activity throughout the day.   Plan: Pt. To continue with strength-based, CBT therapy. Return again in 2 weeks.  Diagnosis:Axis I: Bipolar, Depressed  Axis II: No diagnosis   Nancie Neas, [facility] 03/16/2015

## 2015-04-05 ENCOUNTER — Ambulatory Visit: Payer: Medicaid Other

## 2015-04-09 ENCOUNTER — Ambulatory Visit (HOSPITAL_COMMUNITY): Payer: Self-pay | Admitting: Psychiatry

## 2015-04-10 ENCOUNTER — Ambulatory Visit (HOSPITAL_COMMUNITY): Payer: Self-pay | Admitting: Psychiatry

## 2015-04-13 ENCOUNTER — Other Ambulatory Visit (HOSPITAL_COMMUNITY): Payer: Self-pay | Admitting: Psychiatry

## 2015-04-16 ENCOUNTER — Other Ambulatory Visit (HOSPITAL_COMMUNITY): Payer: Self-pay | Admitting: Psychiatry

## 2015-04-19 ENCOUNTER — Ambulatory Visit: Payer: Medicaid Other | Admitting: Nurse Practitioner

## 2015-04-24 ENCOUNTER — Ambulatory Visit (INDEPENDENT_AMBULATORY_CARE_PROVIDER_SITE_OTHER): Payer: Medicaid Other | Admitting: Psychiatry

## 2015-04-24 DIAGNOSIS — F909 Attention-deficit hyperactivity disorder, unspecified type: Secondary | ICD-10-CM | POA: Diagnosis not present

## 2015-04-24 DIAGNOSIS — F3131 Bipolar disorder, current episode depressed, mild: Secondary | ICD-10-CM

## 2015-04-24 DIAGNOSIS — F988 Other specified behavioral and emotional disorders with onset usually occurring in childhood and adolescence: Secondary | ICD-10-CM
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
## 2015-05-01 ENCOUNTER — Encounter (HOSPITAL_COMMUNITY): Payer: Self-pay | Admitting: Psychiatry

## 2015-05-01 ENCOUNTER — Ambulatory Visit (INDEPENDENT_AMBULATORY_CARE_PROVIDER_SITE_OTHER): Payer: Medicaid Other | Admitting: Psychiatry

## 2015-05-01 VITALS — BP 118/60 | HR 85 | Ht 65.0 in | Wt 202.8 lb

## 2015-05-01 DIAGNOSIS — F909 Attention-deficit hyperactivity disorder, unspecified type: Secondary | ICD-10-CM

## 2015-05-01 DIAGNOSIS — F419 Anxiety disorder, unspecified: Secondary | ICD-10-CM

## 2015-05-01 DIAGNOSIS — F3131 Bipolar disorder, current episode depressed, mild: Secondary | ICD-10-CM | POA: Diagnosis not present

## 2015-05-01 DIAGNOSIS — F988 Other specified behavioral and emotional disorders with onset usually occurring in childhood and adolescence: Secondary | ICD-10-CM

## 2015-05-01 MED ORDER — FLUOXETINE HCL 40 MG PO CAPS: ORAL_CAPSULE | ORAL | Status: DC

## 2015-05-01 MED ORDER — LAMOTRIGINE 150 MG PO TABS: 150.0000 mg | ORAL_TABLET | Freq: Two times a day (BID) | ORAL | Status: DC

## 2015-05-01 MED ORDER — LORAZEPAM 0.5 MG PO TABS: 0.5000 mg | ORAL_TABLET | Freq: Every day | ORAL | Status: DC | PRN

## 2015-05-01 MED ORDER — AMPHETAMINE-DEXTROAMPHET ER 20 MG PO CP24: 20.0000 mg | ORAL_CAPSULE | Freq: Every day | ORAL | Status: DC

## 2015-05-01 NOTE — Progress Notes (Signed)
[place] [phone] Progress Note  Kristin Stephens [id number] 30 y.o.  05/01/2015 2:01 PM  Chief Complaint:  Today I'm very emotional.  I missed my uncle .  I have been more sad and tearful.    History of Present Illness:  Tally came for her follow-up appointment.   she has been experiencing increased anxiety and depression.  She endorse Christmas was good but she missed her uncle a lot who died last year.  She admitted poor sleep, irritability, isolation and more withdrawn.  Earlier she called [place] because she accidentally to her medication in the garbage and she was given a new prescription.  She is taking Prozac and Adderall in the morning and trazodone at bedtime.  She does not take trazodone every night.  She also does not take Ativan every day.  She admitted lately having more anxious and panic attack.  She was very disappointed because she was let go from her job after 6 weeks.  Patient told she did this take an since she was under 90 day probation , they let her go.  She is actively looking for a job.  She admitted some time poor energy, anhedonia and crying spells.  She denies any suicidal thoughts or homicidal thought but admitted some time hopeless.  She continues to see her daughter who is now 65 years old and live in [place] the patient's sister.  Patient denies drinking or using any illegal substances.  Her appetite is okay.  Her vitals are stable.  She denies any paranoia, hallucination or any aggressive behavior.  She is seeing Eloise Levels for counseling.  Suicidal Ideation: No Plan Formed: No Patient has means to carry out plan: No  Homicidal Ideation: No Plan Formed: No Patient has means to carry out plan: No  Medical History; Patient has history of migraine headache, GERD, allergic rhinitis, hypertension, irritable bowel syndrome and aortic valve repaired.  She has history of ASD and VSD repair, Coaraction of aorta repair, cardiac catheterization and flexible  sigmoidoscopy.  Past Psychiatric History/Hospitalization(s) Patient has history of mood swing, depression, anger and poor attention concentration.  She is diagnosed with bipolar disorder and ADD.  She has history of self abusive behavior in the past and she remember cutting herself with a razor blade when her grandmother died.  She has not done any self abusive behavior in a while.  She was seen this Probation officer until April 2012.  After that she was seeing Dr. Harrington Challenger in [place].  In the past she had tried Wellbutrin.  She attended intensive outpatient program in February 2016. Anxiety: No Bipolar Disorder: Yes Depression: Yes Mania: Yes Psychosis: No Schizophrenia: No Personality Disorder: No Hospitalization for psychiatric illness: No History of Electroconvulsive Shock Therapy: No Prior Suicide Attempts: Patient has self abusive behavior.   Review of Systems  Constitutional: Positive for malaise/fatigue.  Cardiovascular: Negative for chest pain and palpitations.  Gastrointestinal: Negative for nausea and vomiting.  Musculoskeletal: Negative.   Skin: Negative for itching and rash.  Neurological: Positive for headaches. Negative for dizziness and tremors.  Psychiatric/Behavioral: Positive for depression. Negative for suicidal ideas, hallucinations and substance abuse. The patient is nervous/anxious and has insomnia.     Psychiatric: Agitation: No Hallucination: No Depressed Mood: Yes Insomnia: Yes Hypersomnia: No Altered Concentration: No Feels Worthless: Yes Grandiose Ideas: No Belief In Special Powers: No New/Increased Substance Abuse: No Compulsions: No  Neurologic: Headache: Yes Seizure: No Paresthesias: No   Musculoskeletal: Strength & Muscle Tone: within normal limits Gait &  Station: normal Patient leans: N/A  Outpatient Encounter Prescriptions as of 05/01/2015  Medication Sig  . albuterol (PROVENTIL HFA;VENTOLIN HFA) 108 (90 BASE) MCG/ACT inhaler Inhale 1-2 puffs  into the lungs every 6 (six) hours as needed for wheezing.  Marland Kitchen amphetamine-dextroamphetamine (ADDERALL XR) 20 MG 24 hr capsule Take 1 capsule (20 mg total) by mouth daily.  . beclomethasone (QVAR) 80 MCG/ACT inhaler Inhale 2 puffs into the lungs 2 (two) times daily.  . clobetasol cream (TEMOVATE) [id number] % Apply 1 application topically 2 (two) times daily as needed (eczema rash).  . DEXILANT 60 MG capsule TAKE 1 CAPSULE BY MOUTH ONCE DAILY.  Marland Kitchen FLUoxetine (PROZAC) 40 MG capsule TAKE 1 CAPSULE BY MOUTH EVERY DAY  . fluticasone (FLONASE) 50 MCG/ACT nasal spray INSTILL 2 SPRAYS INTO EACH NOSTRIL DAILY.  Marland Kitchen gabapentin (NEURONTIN) 300 MG capsule 2 po qam; one in the afternoon; 2 in the evening  . HYDROcodone-acetaminophen (NORCO) 7.5-325 MG tablet One po qd prn pain  . lamoTRIgine (LAMICTAL) 150 MG tablet Take 1 tablet (150 mg total) by mouth 2 (two) times daily.  Marland Kitchen LORazepam (ATIVAN) 0.5 MG tablet Take 1 tablet (0.5 mg total) by mouth daily as needed for anxiety.  . medroxyPROGESTERone (DEPO-PROVERA) 150 MG/ML injection Inject 1 mL (150 mg total) into the muscle every 3 (three) months.  . metoCLOPramide (REGLAN) 10 MG tablet Take 1 tablet (10 mg total) by mouth 3 (three) times daily as needed for nausea (headache / nausea).  . traZODone (DESYREL) 50 MG tablet Take 1 tablet (50 mg total) by mouth at bedtime as needed for sleep.  . [DISCONTINUED] acetaminophen (TYLENOL) 500 MG tablet Take 500 mg by mouth every 6 (six) hours as needed for moderate pain or headache.   . [DISCONTINUED] amphetamine-dextroamphetamine (ADDERALL XR) 20 MG 24 hr capsule Take 1 capsule (20 mg total) by mouth daily.  . [DISCONTINUED] cyclobenzaprine (FLEXERIL) 10 MG tablet TAKE ONE TABLET BY MOUTH AT BEDTIME AS NEEDED FOR MUSCLE SPASMS.  . [DISCONTINUED] cycloSPORINE (RESTASIS) 0.05 % ophthalmic emulsion Place 1 drop into both eyes 2 (two) times daily.  . [DISCONTINUED] FLUoxetine (PROZAC) 40 MG capsule TAKE 1 CAPSULE BY MOUTH EVERY DAY   . [DISCONTINUED] ibuprofen (ADVIL,MOTRIN) 400 MG tablet Take 400 mg by mouth 3 (three) times daily.  . [DISCONTINUED] lamoTRIgine (LAMICTAL) 200 MG tablet Take 1 tablet (200 mg total) by mouth at bedtime.  . [DISCONTINUED] loratadine (CLARITIN) 10 MG tablet Take 10 mg by mouth daily as needed for allergies.   . [DISCONTINUED] LORazepam (ATIVAN) 0.5 MG tablet Take 1 tablet (0.5 mg total) by mouth daily as needed for anxiety.   No facility-administered encounter medications on file as of 05/01/2015.    No results found for this or any previous visit (from the past 2160 hour(s)).  Physical Exam: Consitutional ;  BP 118/60 mmHg  Pulse 85  Ht 5\' 5"  (1.651 m)  Wt 202 lb 12.8 oz (91.989 kg)  BMI 33.75 kg/m2  Mental Status Examination;  Patient is casually dressed and groomed.  She is tearful and anxious.  She admitted her mood is sad and depressed.  Her affect is constricted.  She maintained good eye contact.  She is cooperative.  Her speech is slow, clear, coherent.  She denies any active or passive suicidal parts or homicidal thought.  She denies any paranoia or any hallucination.  Her thought processes slow but logical and goal-directed.  Her psychomotor activity is decreased.  She has no tremors or shakes.  Her attention  and concentration is fair.  She has no flight of ideas or any loose association.  Her cognition is good.  She is alert and oriented 3.  Her insight judgment and impulse control is okay.  Established Problem, Stable/Improving (1), Review of Psycho-Social Stressors (1), Review and summation of old records (2), Established Problem, Worsening (2), Review of Last Therapy Session (1), Review of Medication Regimen & Side Effects (2) and Review of New Medication or Change in Dosage (2)  Assessment: Axis I: Bipolar disorder depressed type, ADHD, anxiety disorder  Axis II: Deferred  Axis III:  Past Medical History  Diagnosis Date  . Esophageal reflux   . IBS (irritable bowel  syndrome)   . Anxiety   . Depression   . Bipolar affective disorder ([place])   . TMJ syndrome   . Asthma     daily and prn inhalers  . Deviated nasal septum 05/2012  . Nasal turbinate hypertrophy 05/2012    bilateral  . Dental crowns present   . Eczema     right leg  . Migraine headache   . Hypertension   . ADHD (attention deficit hyperactivity disorder)   . Fibromyalgia     Plan:   discuss psychosocial issues and stressors.  Reinforce to keep her medication safe since she is taking controlled substance.  Discussed stimulant use, dependency and potential withdrawal symptoms.  Recommended to try Lamictal 300 mg daily and if she developed a rash that she needed to stop the medication immediately.  Continue trazodone 50 mg at bedtime as needed and lorazepam 0.5 mg as needed for anxiety.  Continue Prozac 40 mg daily.  Patient is also taking multiple pain medication but they are only as needed.  Discuss drug drug interaction in detail.  Encouraged to keep appointment with Eloise Levels for coping and social skills.  I will see her again in 4 weeks.  Discuss safety plan that anytime having active suicidal thoughts or homicidal thoughts and she need to call 911 or go to the local emergency room.  Aricela Bertagnolli T., MD 05/01/2015

## 2015-05-02 ENCOUNTER — Telehealth: Payer: Self-pay | Admitting: Family Medicine

## 2015-05-02 NOTE — Telephone Encounter (Signed)
Error

## 2015-05-04 ENCOUNTER — Ambulatory Visit: Payer: Medicaid Other | Admitting: Nurse Practitioner

## 2015-05-09 ENCOUNTER — Encounter: Payer: Self-pay | Admitting: Nurse Practitioner

## 2015-05-09 ENCOUNTER — Ambulatory Visit (INDEPENDENT_AMBULATORY_CARE_PROVIDER_SITE_OTHER): Payer: Medicaid Other | Admitting: Nurse Practitioner

## 2015-05-09 VITALS — BP 122/80 | Ht 65.0 in | Wt 203.0 lb

## 2015-05-09 DIAGNOSIS — Z30013 Encounter for initial prescription of injectable contraceptive: Secondary | ICD-10-CM | POA: Diagnosis not present

## 2015-05-09 LAB — POCT URINE PREGNANCY: PREG TEST UR: NEGATIVE

## 2015-05-09 MED ORDER — HYDROCODONE-ACETAMINOPHEN 7.5-325 MG PO TABS: ORAL_TABLET | ORAL | Status: DC

## 2015-05-09 MED ORDER — MEDROXYPROGESTERONE ACETATE 150 MG/ML IM SUSP: 150.0000 mg | INTRAMUSCULAR | Status: DC

## 2015-05-09 MED ORDER — MEDROXYPROGESTERONE ACETATE 150 MG/ML IM SUSP
150.0000 mg | Freq: Once | INTRAMUSCULAR | Status: AC
Start: 2015-05-09 — End: 2015-05-09
  Administered 2015-05-09: 150 mg via INTRAMUSCULAR

## 2015-05-09 NOTE — Patient Instructions (Signed)
Next depo provera due April 26th - may 10th

## 2015-05-10 ENCOUNTER — Ambulatory Visit (HOSPITAL_COMMUNITY): Payer: Self-pay | Admitting: Psychiatry

## 2015-05-11 ENCOUNTER — Encounter: Payer: Self-pay | Admitting: Nurse Practitioner

## 2015-05-11 NOTE — Progress Notes (Signed)
Subjective:  Presents to discuss restarting her Depo-Provera. No regular menstrual cycle for 6 years. Occasional spotting. No history of recent intercourse. Does not need screening for STD. Also needs refill on her pain medicine, uses very rarely only for severe pain. Continues follow-up with psychiatry.  Objective:   BP 122/80 mmHg  Ht 5\' 5"  (1.651 m)  Wt 203 lb (92.08 kg)  BMI 33.78 kg/m2  LMP 05/09/2015 NAD. Alert, oriented. Lungs clear. Heart regular rate rhythm. Results for orders placed or performed in visit on 05/09/15  POCT urine pregnancy  Result Value Ref Range   Preg Test, Ur Negative Negative     Assessment: Encounter for initial prescription of injectable contraceptive - Plan: POCT urine pregnancy, medroxyPROGESTERone (DEPO-PROVERA) injection 150 mg  Plan:  Meds ordered this encounter  Medications  . HYDROcodone-acetaminophen (NORCO) 7.5-325 MG tablet    Sig: One po qd prn pain    Dispense:  20 tablet    Refill:  0    Order Specific Question:  Supervising Provider    Answer:  Mikey Kirschner [[id number]]  . medroxyPROGESTERone (DEPO-PROVERA) 150 MG/ML injection    Sig: Inject 1 mL (150 mg total) into the muscle every 3 (three) months.    Dispense:  1 mL    Refill:  11  . medroxyPROGESTERone (DEPO-PROVERA) injection 150 mg    Sig:   . lamoTRIgine (LAMICTAL) 200 MG tablet    Sig: Take 200 mg by mouth at bedtime.    Refill:  1   Discussed safe sex issues. Call back if any problems on Depo-Provera. Preventive health physical due late summer. Use pain medicine very sparingly. Return in about 3 months (around 08/06/2015) for depo provera.

## 2015-05-22 ENCOUNTER — Ambulatory Visit (INDEPENDENT_AMBULATORY_CARE_PROVIDER_SITE_OTHER): Payer: Medicaid Other | Admitting: Psychiatry

## 2015-05-22 DIAGNOSIS — F3131 Bipolar disorder, current episode depressed, mild: Secondary | ICD-10-CM

## 2015-05-24 ENCOUNTER — Other Ambulatory Visit (HOSPITAL_COMMUNITY): Payer: Self-pay | Admitting: Psychiatry

## 2015-05-24 ENCOUNTER — Other Ambulatory Visit: Payer: Self-pay | Admitting: Family Medicine

## 2015-05-24 DIAGNOSIS — F3131 Bipolar disorder, current episode depressed, mild: Secondary | ICD-10-CM

## 2015-05-24 NOTE — Progress Notes (Signed)
   THERAPIST PROGRESS NOTE   Session Time: 3:05-3:55  Participation Level: Active  Behavioral Response: CasualAlertMildlyTearful  Type of Therapy: Individual Therapy  Treatment Goals addressed: Anger/Assertive Communication Skills; life planning  Interventions: CBT and Strength-based  Summary: Kristin Stephens is a 30 y.o. female who presents with Bipolar Disorder.   Suicidal/Homicidal: Nowithout intent/plan  Therapist Response: Pt. Continues to present as talkative, engaged in counseling process, mildly tearful. Pt. Reports that she has been denied access to her daughter and that her family use her daughter as a reward for her desired behavior. Currently they have told her that she cannot see her daughter until they see that she has made progress finding a job. Pt. Reports that she has to schedule telephone calls and her daughter is allowed to talk to her if she has been good. Pt. Believes that her daughter does not have to "earn" contact for other family members. Pt. Is growing increasingly discouraged because of lack of contact with her daughter and lack of clear indication of when she will be able to see her daughter. Pt. Finds her brother to be a significant source of support, but her mother and sister and brother-in-law believed that she should be given "tough love" which includes denial of relationship in order to encourage her to get a job. Pt. Describes pattern of behaviors consistent with parental alienation. Plan: Pt. To continue with strength-based, CBT therapy. Return again in 2 weeks.  Diagnosis:Axis I: Bipolar, Depressed  Axis II: No diagnosis   Nancie Neas, [facility] 05/24/2015

## 2015-05-25 ENCOUNTER — Other Ambulatory Visit (HOSPITAL_COMMUNITY): Payer: Self-pay | Admitting: Psychiatry

## 2015-05-29 ENCOUNTER — Telehealth (HOSPITAL_COMMUNITY): Payer: Self-pay

## 2015-05-29 ENCOUNTER — Ambulatory Visit (HOSPITAL_COMMUNITY): Payer: Self-pay | Admitting: Psychiatry

## 2015-05-29 NOTE — Telephone Encounter (Signed)
Met with Dr. Adele Schilder who approved a one time refill of patient's prescribed Prozac to last patient until she returns now on 06/26/15 as was rescheduled from today due to provider in a meeting.  New order e-scribed to patient's [facility] as prescribed Prozac 40 mg, one capsule by mouth daily, #30 with no refills as approved by Dr. Adele Schilder.

## 2015-05-29 NOTE — Telephone Encounter (Signed)
Patient was called to change her appointment, it is set for later this month. She will be out of Lamictal, Prozac, trazodone and Adderall before her appointment, is it okay to refill these? She also would like you to know that the Lamictal increase is working well. Please advise, thank you.

## 2015-06-02 ENCOUNTER — Other Ambulatory Visit (HOSPITAL_COMMUNITY): Payer: Self-pay | Admitting: Psychiatry

## 2015-06-04 ENCOUNTER — Other Ambulatory Visit (HOSPITAL_COMMUNITY): Payer: Self-pay | Admitting: Psychiatry

## 2015-06-04 ENCOUNTER — Ambulatory Visit (HOSPITAL_COMMUNITY): Payer: Self-pay | Admitting: Psychiatry

## 2015-06-04 NOTE — Telephone Encounter (Signed)
Message left for patient requesting she call back to verify the current dosage of Lamictal she is taking and pharmacy she needs this sent to last until returns for rescheduled evaluation on 06/26/15.  Requested patient call this nurse back to confirm present dosage.

## 2015-06-07 ENCOUNTER — Telehealth (HOSPITAL_COMMUNITY): Payer: Self-pay

## 2015-06-07 DIAGNOSIS — F988 Other specified behavioral and emotional disorders with onset usually occurring in childhood and adolescence: Secondary | ICD-10-CM

## 2015-06-07 DIAGNOSIS — F3131 Bipolar disorder, current episode depressed, mild: Secondary | ICD-10-CM

## 2015-06-07 MED ORDER — AMPHETAMINE-DEXTROAMPHET ER 20 MG PO CP24: 20.0000 mg | ORAL_CAPSULE | Freq: Every day | ORAL | Status: DC

## 2015-06-07 MED ORDER — TRAZODONE HCL 50 MG PO TABS
50.0000 mg | ORAL_TABLET | Freq: Every evening | ORAL | Status: DC | PRN
Start: 2015-06-07 — End: 2015-08-02

## 2015-06-07 NOTE — Telephone Encounter (Signed)
Patient had to reschedule her appointment and Dr. Adele Schilder approved a one month refill on her medications

## 2015-06-14 ENCOUNTER — Telehealth (HOSPITAL_COMMUNITY): Payer: Self-pay

## 2015-06-14 NOTE — Telephone Encounter (Signed)
Kristin Stephens picked up prescription on [id number] no lic/dlo

## 2015-06-19 ENCOUNTER — Encounter: Payer: Self-pay | Admitting: Gastroenterology

## 2015-06-26 ENCOUNTER — Ambulatory Visit (INDEPENDENT_AMBULATORY_CARE_PROVIDER_SITE_OTHER): Payer: Medicaid Other | Admitting: Psychiatry

## 2015-06-26 ENCOUNTER — Encounter (HOSPITAL_COMMUNITY): Payer: Self-pay | Admitting: Psychiatry

## 2015-06-26 VITALS — BP 123/78 | HR 114 | Ht 65.0 in | Wt 192.0 lb

## 2015-06-26 DIAGNOSIS — F419 Anxiety disorder, unspecified: Secondary | ICD-10-CM | POA: Diagnosis not present

## 2015-06-26 DIAGNOSIS — F988 Other specified behavioral and emotional disorders with onset usually occurring in childhood and adolescence: Secondary | ICD-10-CM

## 2015-06-26 DIAGNOSIS — F3131 Bipolar disorder, current episode depressed, mild: Secondary | ICD-10-CM | POA: Diagnosis not present

## 2015-06-26 DIAGNOSIS — F909 Attention-deficit hyperactivity disorder, unspecified type: Secondary | ICD-10-CM | POA: Diagnosis not present

## 2015-06-26 MED ORDER — AMPHETAMINE-DEXTROAMPHET ER 20 MG PO CP24: 20.0000 mg | ORAL_CAPSULE | Freq: Every day | ORAL | Status: DC

## 2015-06-26 MED ORDER — FLUOXETINE HCL 40 MG PO CAPS: ORAL_CAPSULE | ORAL | Status: DC

## 2015-06-26 MED ORDER — LORAZEPAM 0.5 MG PO TABS: 0.5000 mg | ORAL_TABLET | Freq: Every day | ORAL | Status: DC | PRN

## 2015-06-26 MED ORDER — LAMOTRIGINE 150 MG PO TABS: 150.0000 mg | ORAL_TABLET | Freq: Two times a day (BID) | ORAL | Status: DC

## 2015-06-26 NOTE — Progress Notes (Signed)
Kristin Stephens Progress Note  Kristin Stephens [id number] 31 y.o.  06/26/2015 11:19 AM  Chief Complaint:  I am doing better with increase Lamictal.      History of Present Illness:  Kristin Stephens came for her follow-up appointment.  On her last visit we increase Lamictal 300 and she has seen improvement with increased medication.  She is less depressed and less anxious.  She is able to get 20 hours a week employment at [employer].  She is very happy about it.  She is more active social and able to lose weight from the last visit.  She has more energy.  She is not taking Ativan and trazodone every night.  She rarely takes pain medication.  She has no rash, itching or any tremors or shakes.  Her sleep is good.  She denies any major panic attack.  Her daughter lives in [place] with patient's sister and patient to face time every day.  Patient denies drinking or using any illegal substances.  She denies any paranoia, hallucination, aggressive behavior or any impulsive behavior.  She start counseling with Eloise Levels and she is very pleased with the counseling.  She wants to continue Prozac, Lamictal and Adderall.  She is using lorazepam and trazodone only as needed.  Suicidal Ideation: No Plan Formed: No Patient has means to carry out plan: No  Homicidal Ideation: No Plan Formed: No Patient has means to carry out plan: No  Medical History; Patient has history of migraine headache, GERD, allergic rhinitis, hypertension, irritable bowel syndrome and aortic valve repaired.  She has history of ASD and VSD repair, Coaraction of aorta repair, cardiac catheterization and flexible sigmoidoscopy.  Past Psychiatric History/Hospitalization(s) Patient has history of mood swing, depression, anger and poor attention concentration.  She is diagnosed with bipolar disorder and ADD.  She has history of self abusive behavior in the past and she remember cutting herself with a razor blade when her  grandmother died.  She has not done any self abusive behavior in a while.  She was seen this Probation officer until April 2012.  After that she was seeing Dr. Harrington Challenger in [place].  In the past she had tried Wellbutrin.  She attended intensive outpatient program in February 2016. Anxiety: No Bipolar Disorder: Yes Depression: Yes Mania: Yes Psychosis: No Schizophrenia: No Personality Disorder: No Hospitalization for psychiatric illness: No History of Electroconvulsive Shock Therapy: No Prior Suicide Attempts: Patient has self abusive behavior.   Review of Systems  Cardiovascular: Negative for chest pain and palpitations.  Gastrointestinal: Negative for nausea and vomiting.  Musculoskeletal: Negative.   Skin: Negative for itching and rash.  Neurological: Negative for dizziness and tremors.  Psychiatric/Behavioral: Negative for suicidal ideas, hallucinations and substance abuse.    Psychiatric: Agitation: No Hallucination: No Depressed Mood: No Insomnia: No Hypersomnia: No Altered Concentration: No Feels Worthless: No Grandiose Ideas: No Belief In Special Powers: No New/Increased Substance Abuse: No Compulsions: No  Neurologic: Headache: No Seizure: No Paresthesias: No   Musculoskeletal: Strength & Muscle Tone: within normal limits Gait & Station: normal Patient leans: N/A  Outpatient Encounter Prescriptions as of 06/26/2015  Medication Sig  . albuterol (PROVENTIL HFA;VENTOLIN HFA) 108 (90 BASE) MCG/ACT inhaler Inhale 1-2 puffs into the lungs every 6 (six) hours as needed for wheezing.  Marland Kitchen amphetamine-dextroamphetamine (ADDERALL XR) 20 MG 24 hr capsule Take 1 capsule (20 mg total) by mouth daily.  . beclomethasone (QVAR) 80 MCG/ACT inhaler Inhale 2 puffs into the lungs 2 (two) times daily.  Marland Kitchen  clobetasol cream (TEMOVATE) [id number] % Apply 1 application topically 2 (two) times daily as needed (eczema rash).  . DEXILANT 60 MG capsule TAKE 1 CAPSULE BY MOUTH ONCE DAILY.  Marland Kitchen FLUoxetine  (PROZAC) 40 MG capsule TAKE 1 CAPSULE BY MOUTH EVERY DAY  . fluticasone (FLONASE) 50 MCG/ACT nasal spray USE 2 SPRAYS IN EACH NOSTRIL EVERY DAY  . gabapentin (NEURONTIN) 300 MG capsule 2 po qam; one in the afternoon; 2 in the evening  . HYDROcodone-acetaminophen (NORCO) 7.5-325 MG tablet One po qd prn pain  . lamoTRIgine (LAMICTAL) 150 MG tablet Take 1 tablet (150 mg total) by mouth 2 (two) times daily.  Marland Kitchen LORazepam (ATIVAN) 0.5 MG tablet Take 1 tablet (0.5 mg total) by mouth daily as needed for anxiety.  . medroxyPROGESTERone (DEPO-PROVERA) 150 MG/ML injection Inject 1 mL (150 mg total) into the muscle every 3 (three) months.  . metoCLOPramide (REGLAN) 10 MG tablet Take 1 tablet (10 mg total) by mouth 3 (three) times daily as needed for nausea (headache / nausea).  . traZODone (DESYREL) 50 MG tablet Take 1 tablet (50 mg total) by mouth at bedtime as needed for sleep.  . [DISCONTINUED] amphetamine-dextroamphetamine (ADDERALL XR) 20 MG 24 hr capsule Take 1 capsule (20 mg total) by mouth daily.  . [DISCONTINUED] amphetamine-dextroamphetamine (ADDERALL XR) 20 MG 24 hr capsule Take 1 capsule (20 mg total) by mouth daily.  . [DISCONTINUED] FLUoxetine (PROZAC) 40 MG capsule TAKE 1 CAPSULE BY MOUTH EVERY DAY  . [DISCONTINUED] FLUoxetine (PROZAC) 40 MG capsule TAKE 1 CAPSULE BY MOUTH EVERY DAY  . [DISCONTINUED] lamoTRIgine (LAMICTAL) 150 MG tablet TAKE 1 TABLET BY MOUTH 2 TIMES DAILY  . [DISCONTINUED] lamoTRIgine (LAMICTAL) 200 MG tablet Take 200 mg by mouth at bedtime.  . [DISCONTINUED] LORazepam (ATIVAN) 0.5 MG tablet Take 1 tablet (0.5 mg total) by mouth daily as needed for anxiety.   No facility-administered encounter medications on file as of 06/26/2015.    Recent Results (from the past 2160 hour(s))  POCT urine pregnancy     Status: None   Collection Time: 05/09/15  9:50 AM  Result Value Ref Range   Preg Test, Ur Negative Negative    Physical Exam: Consitutional ;  BP 123/78 mmHg  Pulse  114  Ht 5\' 5"  (1.651 m)  Wt 192 lb (87.091 kg)  BMI 31.95 kg/m2  Mental Status Examination;  Patient is casually dressed and groomed.  She is cooperative and maintained good eye contact.  She described her mood euthymic and her affect is improved from the past.  Her speech is slow, clear, coherent.  She denies any active or passive suicidal parts or homicidal thought.  She denies any paranoia or any hallucination.  Her thought processes slow but logical and goal-directed.  Her psychomotor activity is decreased.  She has no tremors or shakes.  Her attention and concentration is fair.  She has no flight of ideas or any loose association.  Her cognition is good.  She is alert and oriented 3.  Her insight judgment and impulse control is okay.  Established Problem, Stable/Improving (1), Review of Psycho-Social Stressors (1), Review of Last Therapy Session (1) and Review of Medication Regimen & Side Effects (2)  Assessment: Axis I: Bipolar disorder depressed type, ADHD, anxiety disorder  Axis II: Deferred  Axis III:  Past Medical History  Diagnosis Date  . Esophageal reflux   . IBS (irritable bowel syndrome)   . Anxiety   . Depression   . Bipolar affective disorder ([place])   .  TMJ syndrome   . Asthma     daily and prn inhalers  . Deviated nasal septum 05/2012  . Nasal turbinate hypertrophy 05/2012    bilateral  . Dental crowns present   . Eczema     right leg  . Migraine headache   . Hypertension   . ADHD (attention deficit hyperactivity disorder)   . Fibromyalgia     Plan:  Patient is doing better from the past.  She has no rash or itching with Lamictal.  I will continue Lamictal 150 mg twice a day, Prozac 40 mg daily and Adderall 20 mg daily.  Discussed medication side effects and benefits.  She takes lorazepam and trazodone only as needed.  I encouraged to keep appointment with Eloise Levels for counseling.  Recommended to call us back if she has any question or any concern.   Follow-up in 3 months.Discuss safety plan that anytime having active suicidal thoughts or homicidal thoughts and she need to call 911 or go to the local emergency room.  Mariona Scholes T., MD 06/26/2015

## 2015-06-29 ENCOUNTER — Other Ambulatory Visit: Payer: Self-pay | Admitting: Gastroenterology

## 2015-07-09 ENCOUNTER — Ambulatory Visit (INDEPENDENT_AMBULATORY_CARE_PROVIDER_SITE_OTHER): Payer: Medicaid Other | Admitting: Psychiatry

## 2015-07-09 DIAGNOSIS — F3131 Bipolar disorder, current episode depressed, mild: Secondary | ICD-10-CM

## 2015-07-09 DIAGNOSIS — F909 Attention-deficit hyperactivity disorder, unspecified type: Secondary | ICD-10-CM

## 2015-07-09 DIAGNOSIS — F988 Other specified behavioral and emotional disorders with onset usually occurring in childhood and adolescence: Secondary | ICD-10-CM

## 2015-07-09 NOTE — Progress Notes (Signed)
   THERAPIST PROGRESS NOTE  Session Time: 1:05-2:00  Participation Level: Active  Behavioral Response: CasualAlertMildlyTearful  Type of Therapy: Individual Therapy  Treatment Goals addressed: Anger/Assertive Communication Skills; life planning  Interventions: CBT and Strength-based  Summary: Kristin Stephens is a 30 y.o. female who presents with Bipolar Disorder.   Suicidal/Homicidal: Nowithout intent/plan  Therapist Response: Pt. Continues to present as talkative, engaged in therapeutic process. Pt. Presents as casually, but neatly dressed. Pt. Reports that she has gotten a part-time job at [employer] since our last session. Pt. Reports feeling discouraged because despite meeting her sister's demands to get a job so that she could resume visitation with her daughter, Pt.'s sister now says that she needs to have a full-time job before having visitation. Therapist provided psychoeducation regarding patterns of emotional abuse that involve gas-lighting and parental alienation. Pt. Reports that she continues to have to ask for permission to talk to her daughter or to see her. Pt. Reports that her daughter is allowed to come to town to visit with her father during spring vacation, and she will see her during his visit, but is not granted her own time. Pt. Reports that her daughter regularly asks "Mommy what did you eat today?" pt. Was encouraged to begin a virtual scrapbook on instagram with pictures of her meals so that she can send to her daughter and use the pictures to enter communication with her. Pt. Reported that her daughter sent her a 4 leafed clover for [place] Patrick's day with statements of gratitude. Pt.was encouraged to continue with this them and send to her daughter regularly with the theme of gratitude. Pt. Was insightful regarding connection between forced distance with her daughter and her current mood and stated belief that her depression would be better if she were allowed to she  her daughter on a regular basis.   Diagnosis:Axis I: Bipolar, Depressed  Axis II: No diagnosis    Nancie Neas, [facility] 07/09/2015

## 2015-07-23 ENCOUNTER — Ambulatory Visit (INDEPENDENT_AMBULATORY_CARE_PROVIDER_SITE_OTHER): Payer: Medicaid Other | Admitting: Psychiatry

## 2015-07-23 DIAGNOSIS — F909 Attention-deficit hyperactivity disorder, unspecified type: Secondary | ICD-10-CM

## 2015-07-23 DIAGNOSIS — F988 Other specified behavioral and emotional disorders with onset usually occurring in childhood and adolescence: Secondary | ICD-10-CM

## 2015-07-23 DIAGNOSIS — F3131 Bipolar disorder, current episode depressed, mild: Secondary | ICD-10-CM

## 2015-07-23 NOTE — Progress Notes (Signed)
   THERAPIST PROGRESS NOTE  Session Time: 1:05-2:00  Participation Level: Active  Behavioral Response: CasualAlertMildlyTearful  Type of Therapy: Individual Therapy  Treatment Goals addressed: Anger/Assertive Communication Skills; life planning  Interventions: CBT and Strength-based  Summary: Kristin Stephens is a 30 y.o. female who presents with Bipolar Disorder.   Suicidal/Homicidal: Nowithout intent/plan  Therapist Response: Pt. Presented in session with brother Ouida Sills). Pt. Requested family session for purpose of facilitating progress toward goals of self-esteem, assertiveness, and communication skills to assist with reunification with her daugther. Ouida Sills shared concerns about family's perception of not taking initiative to find a job and being consistent with her communication and efforts to see her daughter. Significant time in session was spent distinguishing between short-term (i.e., visits) and long-term goals (i.e., reunification). Patient's pattern of becoming overwhelmed by her emotions and not thinking about the consequences of her actions i.e., not consistently contacting her sister with her schedule were discussed. Pt. Committed to weekly communication of her work schedule with suggestions for child appropriate activities to be sent to her sister every Friday when she receives her work schedule. Pt. Also committed to requesting in her initial e-mail the types of information that she would like to be provided regularly such as doctor's visits, meetings with the school counselor, and school progress reports. Counselor reviewed importance of family's positive affirmation of patient's efforts and general suggestion not to use time with patient's daughter as a reward for desired behavior. Pt. And her brother indicated that they were committed to the communication plan as discussed in the session.  Diagnosis:Axis I: Bipolar, Depressed  Axis II: No  diagnosis    Nancie Neas, [facility] 07/23/2015

## 2015-07-25 ENCOUNTER — Ambulatory Visit (INDEPENDENT_AMBULATORY_CARE_PROVIDER_SITE_OTHER): Payer: Medicaid Other

## 2015-07-25 DIAGNOSIS — Z3042 Encounter for surveillance of injectable contraceptive: Secondary | ICD-10-CM | POA: Diagnosis not present

## 2015-07-25 MED ORDER — MEDROXYPROGESTERONE ACETATE 150 MG/ML IM SUSP
150.0000 mg | Freq: Once | INTRAMUSCULAR | Status: AC
  Administered 2015-07-25: 150 mg via INTRAMUSCULAR

## 2015-08-02 ENCOUNTER — Other Ambulatory Visit (HOSPITAL_COMMUNITY): Payer: Self-pay | Admitting: Psychiatry

## 2015-08-06 ENCOUNTER — Ambulatory Visit (HOSPITAL_COMMUNITY): Payer: Self-pay | Admitting: Psychiatry

## 2015-08-23 ENCOUNTER — Ambulatory Visit (INDEPENDENT_AMBULATORY_CARE_PROVIDER_SITE_OTHER): Payer: Medicaid Other | Admitting: Psychiatry

## 2015-08-23 DIAGNOSIS — F988 Other specified behavioral and emotional disorders with onset usually occurring in childhood and adolescence: Secondary | ICD-10-CM

## 2015-08-23 DIAGNOSIS — F909 Attention-deficit hyperactivity disorder, unspecified type: Secondary | ICD-10-CM | POA: Diagnosis not present

## 2015-08-24 NOTE — Progress Notes (Signed)
   THERAPIST PROGRESS NOTE  Session Time: 1:05-2:00  Participation Level: Active  Behavioral Response: CasualAlertMildlyTearful  Type of Therapy: Individual Therapy  Treatment Goals addressed: Anger/Assertive Communication Skills; life planning  Interventions: CBT and Strength-based  Summary: SHANISHA LECH is a 30 y.o. female who presents with Bipolar Disorder.   Suicidal/Homicidal: Nowithout intent/plan  Therapist Response: Pt. Presented for session, talkative, engaged in process. Pt. Reports that after her last session with her brother he told her that her sister was planning to petition for temporary custody of her daughter. Pt. Reports that she felt blindsided and betrayed by the disclosure. Pt. Met with her sister's attorney and signed the consent order. Pt. Reports that the custody order does not detail her visitation or phone calls or requirements for her to regain custody. Session focused on her fear of her family members that keeps her from being assertive during her communications with them. Pt. Reports that her mother recently visited her and she enjoyed the visit because she loves her mother. Pt. Reports that she continues to work in [employer], [employer], but eventually would like to return to school. Pt. Reports that she also met with vocational rehabilitation so that she can begin applying for higher paying jobs.  Diagnosis:Axis I: Bipolar, Depressed  Axis II: No diagnosis    Nancie Neas, [facility] 08/24/2015

## 2015-08-30 ENCOUNTER — Ambulatory Visit (INDEPENDENT_AMBULATORY_CARE_PROVIDER_SITE_OTHER): Payer: Medicaid Other | Admitting: Psychiatry

## 2015-08-30 ENCOUNTER — Encounter (HOSPITAL_COMMUNITY): Payer: Self-pay | Admitting: Psychiatry

## 2015-08-30 VITALS — BP 128/80 | HR 98 | Ht 65.0 in | Wt 196.0 lb

## 2015-08-30 DIAGNOSIS — F909 Attention-deficit hyperactivity disorder, unspecified type: Secondary | ICD-10-CM

## 2015-08-30 DIAGNOSIS — F3131 Bipolar disorder, current episode depressed, mild: Secondary | ICD-10-CM | POA: Diagnosis not present

## 2015-08-30 DIAGNOSIS — F988 Other specified behavioral and emotional disorders with onset usually occurring in childhood and adolescence: Secondary | ICD-10-CM

## 2015-08-30 MED ORDER — AMPHETAMINE-DEXTROAMPHET ER 20 MG PO CP24: 20.0000 mg | ORAL_CAPSULE | Freq: Every day | ORAL | Status: DC

## 2015-08-30 MED ORDER — FLUOXETINE HCL 40 MG PO CAPS: ORAL_CAPSULE | ORAL | Status: DC

## 2015-08-30 MED ORDER — LAMOTRIGINE 150 MG PO TABS: 150.0000 mg | ORAL_TABLET | Freq: Two times a day (BID) | ORAL | Status: DC

## 2015-08-30 NOTE — Progress Notes (Signed)
[place] [phone] Progress Note  LAYLYNN BRACKEN [id number] 30 y.o.  08/30/2015 11:00 AM  Chief Complaint:  Medication management and follow-up.      History of Present Illness:  Kristin Stephens came for her follow-up appointment.  She is taking her medication and reported no side effects.  She is taking Lamictal and Prozac every day.  She has cut down Ativan and and last month she has not taken any Ativan.  She still take rarely trazodone.  She also takes Adderall every day because helping her focus and attention.  She described her anxiety irritability and mood is much stable.  She continues to work part-time 20 hours at [employer] and she likes her job. Her sleep is good.  She denies any major panic attack.  Her daughter lives in [place] with patient's sister and an she continues to talk with her almost every day.  She is planning to visit her next week. Patient denies drinking or using any illegal substances.  She denies any paranoia, hallucination, aggressive behavior or any impulsive behavior.  She likes counseling with Eloise Levels and she feels much improvement in her symptoms.  Her appetite is okay.  Her vitals are stable.  She has no shakes, tremors, rash or any itching.  Suicidal Ideation: No Plan Formed: No Patient has means to carry out plan: No  Homicidal Ideation: No Plan Formed: No Patient has means to carry out plan: No  Medical History; Patient has history of migraine headache, GERD, allergic rhinitis, hypertension, irritable bowel syndrome and aortic valve repaired.  She has history of ASD and VSD repair, Coaraction of aorta repair, cardiac catheterization and flexible sigmoidoscopy.  Past Psychiatric History/Hospitalization(s) Patient has history of mood swing, depression, anger and poor attention concentration.  She is diagnosed with bipolar disorder and ADD.  She has history of self abusive behavior in the past and she remember cutting herself with a razor blade  when her grandmother died.  She has not done any self abusive behavior in a while.  She was seen this Probation officer until April 2012.  After that she was seeing Dr. Harrington Challenger in [place].  In the past she had tried Wellbutrin.  She attended intensive outpatient program in February 2016. Anxiety: No Bipolar Disorder: Yes Depression: Yes Mania: Yes Psychosis: No Schizophrenia: No Personality Disorder: No Hospitalization for psychiatric illness: No History of Electroconvulsive Shock Therapy: No Prior Suicide Attempts: Patient has self abusive behavior.   Review of Systems  Cardiovascular: Negative for chest pain and palpitations.  Gastrointestinal: Negative for nausea and vomiting.  Musculoskeletal: Negative.   Skin: Negative for itching and rash.  Neurological: Negative for dizziness and tremors.  Psychiatric/Behavioral: Negative for suicidal ideas, hallucinations and substance abuse.    Psychiatric: Agitation: No Hallucination: No Depressed Mood: No Insomnia: No Hypersomnia: No Altered Concentration: No Feels Worthless: No Grandiose Ideas: No Belief In Special Powers: No New/Increased Substance Abuse: No Compulsions: No  Neurologic: Headache: No Seizure: No Paresthesias: No   Musculoskeletal: Strength & Muscle Tone: within normal limits Gait & Station: normal Patient leans: N/A  Outpatient Encounter Prescriptions as of 08/30/2015  Medication Sig  . albuterol (PROVENTIL HFA;VENTOLIN HFA) 108 (90 BASE) MCG/ACT inhaler Inhale 1-2 puffs into the lungs every 6 (six) hours as needed for wheezing.  Marland Kitchen amphetamine-dextroamphetamine (ADDERALL XR) 20 MG 24 hr capsule Take 1 capsule (20 mg total) by mouth daily.  . beclomethasone (QVAR) 80 MCG/ACT inhaler Inhale 2 puffs into the lungs 2 (two) times daily.  Marland Kitchen  clobetasol cream (TEMOVATE) [id number] % Apply 1 application topically 2 (two) times daily as needed (eczema rash).  . DEXILANT 60 MG capsule TAKE 1 CAPSULE BY MOUTH EVERY DAY  . FLUoxetine  (PROZAC) 40 MG capsule TAKE 1 CAPSULE BY MOUTH EVERY DAY  . fluticasone (FLONASE) 50 MCG/ACT nasal spray USE 2 SPRAYS IN EACH NOSTRIL EVERY DAY  . gabapentin (NEURONTIN) 300 MG capsule 2 po qam; one in the afternoon; 2 in the evening  . HYDROcodone-acetaminophen (NORCO) 7.5-325 MG tablet One po qd prn pain  . lamoTRIgine (LAMICTAL) 150 MG tablet Take 1 tablet (150 mg total) by mouth 2 (two) times daily.  Marland Kitchen LORazepam (ATIVAN) 0.5 MG tablet Take 1 tablet (0.5 mg total) by mouth daily as needed for anxiety.  . medroxyPROGESTERone (DEPO-PROVERA) 150 MG/ML injection Inject 1 mL (150 mg total) into the muscle every 3 (three) months.  . metoCLOPramide (REGLAN) 10 MG tablet Take 1 tablet (10 mg total) by mouth 3 (three) times daily as needed for nausea (headache / nausea).  . traZODone (DESYREL) 50 MG tablet TAKE 1 TABLET BY MOUTH AT BEDTIME AS NEEDED FOR SLEEP  . [DISCONTINUED] amphetamine-dextroamphetamine (ADDERALL XR) 20 MG 24 hr capsule Take 1 capsule (20 mg total) by mouth daily.  . [DISCONTINUED] amphetamine-dextroamphetamine (ADDERALL XR) 20 MG 24 hr capsule Take 1 capsule (20 mg total) by mouth daily.  . [DISCONTINUED] FLUoxetine (PROZAC) 40 MG capsule TAKE 1 CAPSULE BY MOUTH EVERY DAY  . [DISCONTINUED] lamoTRIgine (LAMICTAL) 150 MG tablet Take 1 tablet (150 mg total) by mouth 2 (two) times daily.   No facility-administered encounter medications on file as of 08/30/2015.    No results found for this or any previous visit (from the past 2160 hour(s)).  Physical Exam: Consitutional ;  BP 128/80 mmHg  Pulse 98  Ht 5\' 5"  (1.651 m)  Wt 196 lb (88.905 kg)  BMI 32.62 kg/m2  Mental Status Examination;  Patient is casually dressed and groomed.  She is cooperative and maintained good eye contact.  She described her mood euthymic and her affect is improved from the past.  Her speech is slow, clear, coherent.  She denies any active or passive suicidal parts or homicidal thought.  She denies any  paranoia or any hallucination.  Her thought processes slow but logical and goal-directed.  Her psychomotor activity is decreased.  She has no tremors or shakes.  Her attention and concentration is fair.  She has no flight of ideas or any loose association.  Her cognition is good.  She is alert and oriented 3.  Her insight judgment and impulse control is okay.  Established Problem, Stable/Improving (1), Review of Psycho-Social Stressors (1), Review of Last Therapy Session (1) and Review of Medication Regimen & Side Effects (2)  Assessment: Axis I: Bipolar disorder depressed type, ADHD, anxiety disorder  Axis II: Deferred  Axis III:  Past Medical History  Diagnosis Date  . Esophageal reflux   . IBS (irritable bowel syndrome)   . Anxiety   . Depression   . Bipolar affective disorder ([place])   . TMJ syndrome   . Asthma     daily and prn inhalers  . Deviated nasal septum 05/2012  . Nasal turbinate hypertrophy 05/2012    bilateral  . Dental crowns present   . Eczema     right leg  . Migraine headache   . Hypertension   . ADHD (attention deficit hyperactivity disorder)   . Fibromyalgia     Plan:  Patient is  doing better from the past.  I will continue Lamictal 150 mg twice a day, Prozac 40 mg daily and Adderall 20 mg daily.  Discussed medication side effects and benefits.  She has not taken lorazepam and rarely takes trazodone. Recommended to call us back if she has any question or any concern.  Follow-up in 3 months.Discuss safety plan that anytime having active suicidal thoughts or homicidal thoughts and she need to call 911 or go to the local emergency room.  Karlee Staff T., MD 08/30/2015

## 2015-09-05 ENCOUNTER — Ambulatory Visit (HOSPITAL_COMMUNITY): Payer: Self-pay | Admitting: Psychiatry

## 2015-09-06 ENCOUNTER — Ambulatory Visit (INDEPENDENT_AMBULATORY_CARE_PROVIDER_SITE_OTHER): Payer: Medicaid Other | Admitting: Nurse Practitioner

## 2015-09-06 ENCOUNTER — Encounter: Payer: Self-pay | Admitting: Nurse Practitioner

## 2015-09-06 VITALS — BP 122/70 | Ht 65.0 in | Wt 199.1 lb

## 2015-09-06 DIAGNOSIS — M797 Fibromyalgia: Secondary | ICD-10-CM | POA: Diagnosis not present

## 2015-09-06 MED ORDER — GABAPENTIN 300 MG PO CAPS: ORAL_CAPSULE | ORAL | Status: DC

## 2015-09-06 MED ORDER — HYDROCODONE-ACETAMINOPHEN 7.5-325 MG PO TABS: ORAL_TABLET | ORAL | Status: DC

## 2015-09-08 ENCOUNTER — Encounter: Payer: Self-pay | Admitting: Nurse Practitioner

## 2015-09-08 NOTE — Progress Notes (Signed)
Subjective:  Presents for recheck on fibromyalgia. Continues follow up with psychiatry. Take a rare pain pill for her orthopedic issues. Plans follow up with her specialist. Gabapentin working well for fibromyalgia.   Objective:   BP 122/70 mmHg  Ht 5\' 5"  (1.651 m)  Wt 199 lb 2 oz (90.323 kg)  BMI 33.14 kg/m2 NAD. Alert, oriented. Lungs clear. Heart RRR.   Assessment:  Problem List Items Addressed This Visit      Musculoskeletal and Integument   Fibromyalgia - Primary (Chronic)     Plan:  Meds ordered this encounter  Medications  . HYDROcodone-acetaminophen (NORCO) 7.5-325 MG tablet    Sig: One po qd prn pain    Dispense:  20 tablet    Refill:  0    Order Specific Question:  Supervising Provider    Answer:  Mikey Kirschner [[id number]]  . gabapentin (NEURONTIN) 300 MG capsule    Sig: 2 po qam; one in the afternoon; 2 in the evening    Dispense:  150 capsule    Refill:  5    Order Specific Question:  Supervising Provider    Answer:  Mikey Kirschner [[id number]]   Continue to use pain med very sparingly. Recommend continued weight loss effort and activity. Return in about 6 months (around 03/07/2016) for recheck.

## 2015-10-01 ENCOUNTER — Other Ambulatory Visit (HOSPITAL_COMMUNITY): Payer: Self-pay | Admitting: Psychiatry

## 2015-10-10 ENCOUNTER — Ambulatory Visit: Payer: Medicaid Other

## 2015-10-16 ENCOUNTER — Ambulatory Visit (INDEPENDENT_AMBULATORY_CARE_PROVIDER_SITE_OTHER): Payer: Medicaid Other

## 2015-10-16 DIAGNOSIS — Z3049 Encounter for surveillance of other contraceptives: Secondary | ICD-10-CM

## 2015-10-16 DIAGNOSIS — Z3042 Encounter for surveillance of injectable contraceptive: Secondary | ICD-10-CM

## 2015-10-16 MED ORDER — MEDROXYPROGESTERONE ACETATE 150 MG/ML IM SUSP
150.0000 mg | Freq: Once | INTRAMUSCULAR | Status: AC
  Administered 2015-10-16: 150 mg via INTRAMUSCULAR

## 2015-10-30 ENCOUNTER — Other Ambulatory Visit (HOSPITAL_COMMUNITY): Payer: Self-pay | Admitting: Psychiatry

## 2015-10-30 DIAGNOSIS — F3131 Bipolar disorder, current episode depressed, mild: Secondary | ICD-10-CM

## 2015-11-12 ENCOUNTER — Other Ambulatory Visit (HOSPITAL_COMMUNITY): Payer: Self-pay | Admitting: Medical

## 2015-11-12 ENCOUNTER — Telehealth (HOSPITAL_COMMUNITY): Payer: Self-pay

## 2015-11-12 DIAGNOSIS — F988 Other specified behavioral and emotional disorders with onset usually occurring in childhood and adolescence: Secondary | ICD-10-CM

## 2015-11-12 MED ORDER — AMPHETAMINE-DEXTROAMPHET ER 20 MG PO CP24: 20.0000 mg | ORAL_CAPSULE | Freq: Every day | ORAL | 0 refills | Status: DC

## 2015-11-12 NOTE — Telephone Encounter (Signed)
Patient is calling for refill on Adderall, this is an [facility] patient, she was last seen on 6/1 and has a follow up on 9/1. Please review and advise, thank you

## 2015-11-12 NOTE — Telephone Encounter (Signed)
rx on your desk.

## 2015-11-13 ENCOUNTER — Telehealth (HOSPITAL_COMMUNITY): Payer: Self-pay

## 2015-11-13 NOTE — Telephone Encounter (Signed)
I got the rx and called patient to let her know it was ready for pick up.

## 2015-11-13 NOTE — Telephone Encounter (Signed)
11/13/15 9:13am Pt [id number] came and pick-up rx script.Marland KitchenMariana Stephens

## 2015-11-28 ENCOUNTER — Other Ambulatory Visit: Payer: Self-pay | Admitting: Family Medicine

## 2015-11-30 ENCOUNTER — Ambulatory Visit (HOSPITAL_COMMUNITY): Payer: Self-pay | Admitting: Psychiatry

## 2015-12-04 ENCOUNTER — Other Ambulatory Visit: Payer: Self-pay | Admitting: Nurse Practitioner

## 2015-12-04 ENCOUNTER — Telehealth: Payer: Self-pay | Admitting: Nurse Practitioner

## 2015-12-04 ENCOUNTER — Telehealth: Payer: Self-pay | Admitting: Family Medicine

## 2015-12-04 MED ORDER — HYDROCODONE-ACETAMINOPHEN 7.5-325 MG PO TABS: ORAL_TABLET | ORAL | 0 refills | Status: DC

## 2015-12-04 NOTE — Telephone Encounter (Signed)
Last seen 09/08/15

## 2015-12-04 NOTE — Telephone Encounter (Signed)
Tried to call patient per Linzie Collin and let her know prescription is ready for pick up. Voicemail box was full.

## 2015-12-04 NOTE — Telephone Encounter (Signed)
Pt.notified

## 2015-12-04 NOTE — Telephone Encounter (Signed)
Patient is requesting Rx for HYDROcodone-acetaminophen (NORCO) 7.5-325 MG tablet.

## 2015-12-05 ENCOUNTER — Telehealth: Payer: Self-pay | Admitting: *Deleted

## 2015-12-05 NOTE — Telephone Encounter (Signed)
hydocodone not covered by insurance. They sent list of preferred meds or do you want to do PA. Form in yellow folder on your desk.

## 2015-12-10 NOTE — Telephone Encounter (Signed)
Provider portion completed; form with nurses

## 2015-12-14 ENCOUNTER — Other Ambulatory Visit (HOSPITAL_COMMUNITY): Payer: Self-pay

## 2015-12-14 DIAGNOSIS — F3131 Bipolar disorder, current episode depressed, mild: Secondary | ICD-10-CM

## 2015-12-14 DIAGNOSIS — F988 Other specified behavioral and emotional disorders with onset usually occurring in childhood and adolescence: Secondary | ICD-10-CM

## 2015-12-14 MED ORDER — FLUOXETINE HCL 40 MG PO CAPS: 40.0000 mg | ORAL_CAPSULE | Freq: Every day | ORAL | 1 refills | Status: DC

## 2015-12-14 MED ORDER — AMPHETAMINE-DEXTROAMPHET ER 20 MG PO CP24: 20.0000 mg | ORAL_CAPSULE | Freq: Every day | ORAL | 0 refills | Status: DC

## 2015-12-14 MED ORDER — TRAZODONE HCL 50 MG PO TABS: 50.0000 mg | ORAL_TABLET | Freq: Every evening | ORAL | 0 refills | Status: DC | PRN

## 2015-12-28 ENCOUNTER — Encounter: Payer: Self-pay | Admitting: Nurse Practitioner

## 2015-12-28 ENCOUNTER — Telehealth: Payer: Self-pay | Admitting: Internal Medicine

## 2015-12-28 DIAGNOSIS — I359 Nonrheumatic aortic valve disorder, unspecified: Secondary | ICD-10-CM

## 2015-12-28 NOTE — Telephone Encounter (Signed)
Pt caled to make Dec recall-made Nov due to insurance ending the end of Nov. She thought she was to have an Echo, I looked at last ov note from 08-2014 and it states she needs and Echo. I scheduled her if this is okay can you put in an order, if not let me know and I will call and cxl her.

## 2015-12-28 NOTE — Telephone Encounter (Signed)
Reviewed with Dr. Harrington Challenger, she would prefer echo again in next 1-2 months, not spring.  Order placed.

## 2015-12-28 NOTE — Telephone Encounter (Signed)
Pt had echo in 2014, then July 2016.  Will route to Dr. Harrington Challenger for recommendation on when next echo should be.

## 2015-12-28 NOTE — Telephone Encounter (Signed)
Set f/u echo next spring with clinic appt

## 2015-12-31 ENCOUNTER — Other Ambulatory Visit (HOSPITAL_COMMUNITY): Payer: Self-pay | Admitting: Psychiatry

## 2015-12-31 DIAGNOSIS — F3131 Bipolar disorder, current episode depressed, mild: Secondary | ICD-10-CM

## 2016-01-11 ENCOUNTER — Ambulatory Visit (INDEPENDENT_AMBULATORY_CARE_PROVIDER_SITE_OTHER): Payer: Medicaid Other

## 2016-01-11 DIAGNOSIS — Z3042 Encounter for surveillance of injectable contraceptive: Secondary | ICD-10-CM | POA: Diagnosis not present

## 2016-01-11 DIAGNOSIS — Z23 Encounter for immunization: Secondary | ICD-10-CM | POA: Diagnosis not present

## 2016-01-11 MED ORDER — MEDROXYPROGESTERONE ACETATE 150 MG/ML IM SUSP
150.0000 mg | Freq: Once | INTRAMUSCULAR | Status: AC
  Administered 2016-01-11: 150 mg via INTRAMUSCULAR

## 2016-01-15 ENCOUNTER — Telehealth: Payer: Self-pay

## 2016-01-15 ENCOUNTER — Ambulatory Visit (HOSPITAL_COMMUNITY): Payer: Medicaid Other | Attending: Cardiology

## 2016-01-15 ENCOUNTER — Other Ambulatory Visit (HOSPITAL_COMMUNITY): Payer: Self-pay

## 2016-01-15 ENCOUNTER — Other Ambulatory Visit: Payer: Self-pay

## 2016-01-15 DIAGNOSIS — I77819 Aortic ectasia, unspecified site: Secondary | ICD-10-CM | POA: Insufficient documentation

## 2016-01-15 DIAGNOSIS — Z01818 Encounter for other preprocedural examination: Secondary | ICD-10-CM

## 2016-01-15 DIAGNOSIS — Q251 Coarctation of aorta: Secondary | ICD-10-CM

## 2016-01-15 DIAGNOSIS — Q231 Congenital insufficiency of aortic valve: Secondary | ICD-10-CM | POA: Insufficient documentation

## 2016-01-15 DIAGNOSIS — I359 Nonrheumatic aortic valve disorder, unspecified: Secondary | ICD-10-CM | POA: Diagnosis not present

## 2016-01-15 NOTE — Telephone Encounter (Addendum)
-----   Message from [name], MD sent at 01/15/2016  2:11 PM EDT ----- Pumping function of the heart is normal AV shows mild regurgitation   Aorta is mildly dilated I would recomm MRA to evaluate aorta better esp aournd site of coarc    MRA ordered for chest at [facility] slip mailed for BUN.creatinine, urine pregnancy     Pt aware

## 2016-01-17 ENCOUNTER — Other Ambulatory Visit: Payer: Self-pay | Admitting: Internal Medicine

## 2016-01-18 ENCOUNTER — Encounter (INDEPENDENT_AMBULATORY_CARE_PROVIDER_SITE_OTHER): Payer: Self-pay

## 2016-01-18 ENCOUNTER — Other Ambulatory Visit: Payer: Medicaid Other | Admitting: *Deleted

## 2016-01-18 ENCOUNTER — Other Ambulatory Visit (HOSPITAL_COMMUNITY): Payer: Self-pay

## 2016-01-18 DIAGNOSIS — F900 Attention-deficit hyperactivity disorder, predominantly inattentive type: Secondary | ICD-10-CM

## 2016-01-18 DIAGNOSIS — I10 Essential (primary) hypertension: Secondary | ICD-10-CM

## 2016-01-18 DIAGNOSIS — G43001 Migraine without aura, not intractable, with status migrainosus: Secondary | ICD-10-CM

## 2016-01-18 LAB — BUN: BUN: 9 mg/dL (ref 7–25)

## 2016-01-18 LAB — PREGNANCY, URINE: PREG TEST UR: NEGATIVE

## 2016-01-18 LAB — CREATININE, SERUM: Creat: 0.93 mg/dL (ref 0.50–1.10)

## 2016-01-18 MED ORDER — AMPHETAMINE-DEXTROAMPHET ER 20 MG PO CP24: 20.0000 mg | ORAL_CAPSULE | Freq: Every day | ORAL | 0 refills | Status: DC

## 2016-01-18 NOTE — Addendum Note (Signed)
Addended by: Eulis Foster on: 01/18/2016 10:05 AM   Modules accepted: Orders

## 2016-01-21 ENCOUNTER — Ambulatory Visit (HOSPITAL_COMMUNITY)
Admission: RE | Admit: 2016-01-21 | Discharge: 2016-01-21 | Disposition: A | Discharge status: Home / Self Care | Payer: Medicaid Other | Source: Ambulatory Visit | Attending: Internal Medicine | Admitting: Internal Medicine

## 2016-01-21 ENCOUNTER — Encounter (HOSPITAL_COMMUNITY): Payer: Self-pay

## 2016-01-21 ENCOUNTER — Telehealth: Payer: Self-pay | Admitting: *Deleted

## 2016-01-21 DIAGNOSIS — F3131 Bipolar disorder, current episode depressed, mild: Secondary | ICD-10-CM

## 2016-01-21 DIAGNOSIS — Q251 Coarctation of aorta: Secondary | ICD-10-CM

## 2016-01-21 NOTE — Telephone Encounter (Signed)
OK to give Rx for 3 Ativan

## 2016-01-21 NOTE — Telephone Encounter (Signed)
Pt states she went to have MRA chest today. Pt was unable to complete MRA today because of claustrophobia.  Pt states she was told to come to our office to request medication to take prior to chest MRA rescheduled to 01/29/16.  Pt states she has taken ativan 0.5mg  prn ( 1-2 prn) in the past for anxiety but does not have any  at this time and does not have an appointment with her psychiatrist until next month. Pt states she feels ativan 0.5 mg (1-2) to take at time of MRA would be beneficial. Pt advised I will forward to Dr Harrington Challenger for review.

## 2016-01-24 NOTE — Telephone Encounter (Signed)
Pt advised Dr Harrington Challenger will prescribe ativan 0.5mg , take 1-2 as needed for anxiety prior to MRA.

## 2016-01-24 NOTE — Telephone Encounter (Signed)
LMTCB

## 2016-01-24 NOTE — Telephone Encounter (Signed)
Follow Up:; ° ° °Returning your call. °

## 2016-01-29 ENCOUNTER — Ambulatory Visit (HOSPITAL_COMMUNITY)
Admission: RE | Admit: 2016-01-29 | Discharge: 2016-01-29 | Disposition: A | Discharge status: Home / Self Care | Payer: Medicaid Other | Source: Ambulatory Visit | Attending: Internal Medicine | Admitting: Internal Medicine

## 2016-01-29 ENCOUNTER — Encounter (HOSPITAL_COMMUNITY): Payer: Self-pay | Admitting: Radiology

## 2016-01-29 DIAGNOSIS — Q251 Coarctation of aorta: Secondary | ICD-10-CM | POA: Diagnosis present

## 2016-01-29 DIAGNOSIS — Z9889 Other specified postprocedural states: Secondary | ICD-10-CM | POA: Insufficient documentation

## 2016-01-29 DIAGNOSIS — I77819 Aortic ectasia, unspecified site: Secondary | ICD-10-CM | POA: Diagnosis not present

## 2016-01-29 MED ORDER — GADOBENATE DIMEGLUMINE 529 MG/ML IV SOLN
20.0000 mL | Freq: Once | INTRAVENOUS | Status: AC | PRN
  Administered 2016-01-29: 20 mL via INTRAVENOUS

## 2016-01-30 ENCOUNTER — Other Ambulatory Visit (HOSPITAL_COMMUNITY): Payer: Self-pay | Admitting: Psychiatry

## 2016-01-30 ENCOUNTER — Other Ambulatory Visit: Payer: Self-pay | Admitting: Family Medicine

## 2016-01-30 ENCOUNTER — Other Ambulatory Visit: Payer: Self-pay | Admitting: Nurse Practitioner

## 2016-01-30 DIAGNOSIS — F3131 Bipolar disorder, current episode depressed, mild: Secondary | ICD-10-CM

## 2016-02-13 ENCOUNTER — Encounter: Payer: Self-pay | Admitting: Nurse Practitioner

## 2016-02-13 ENCOUNTER — Ambulatory Visit (INDEPENDENT_AMBULATORY_CARE_PROVIDER_SITE_OTHER): Payer: Self-pay | Admitting: Nurse Practitioner

## 2016-02-13 VITALS — BP 138/88 | Ht 65.0 in | Wt 190.0 lb

## 2016-02-13 DIAGNOSIS — M797 Fibromyalgia: Secondary | ICD-10-CM

## 2016-02-13 MED ORDER — HYDROCODONE-ACETAMINOPHEN 7.5-325 MG PO TABS: ORAL_TABLET | ORAL | 0 refills | Status: DC

## 2016-02-13 NOTE — Progress Notes (Signed)
Subjective:  Presents for routine follow up. Is uninsured at this time but has started a new job and hopefully will have insurance in the near future. Doing much better psychologically. Still sees psychiatrist. Take occasional hydrocodone for pain. 20 pills lasted her over 2 months. Has follow up with cardiologist for heart issues.   Objective:   BP 138/88   Ht 5\' 5"  (1.651 m)   Wt 190 lb (86.2 kg)   BMI 31.62 kg/m  NAD. Alert, oriented. Thoughts logical, coherent and relevant. Cheerful affect. Lungs clear. Heart RRR.   Assessment:  Problem List Items Addressed This Visit      Other   Fibromyalgia - Primary (Chronic)     Plan:  Meds ordered this encounter  Medications  . HYDROcodone-acetaminophen (NORCO) 7.5-325 MG tablet    Sig: One po qd prn pain    Dispense:  30 tablet    Refill:  0    Must last 90 days    Order Specific Question:   Supervising Provider    Answer:   Mikey Kirschner [[id number]]   Use pain med sparingly; Rx must last at least 90 days. Reviewed addiction potential. Continue Gabapentin as directed.  Return in about 3 months (around 05/15/2016) for recheck.

## 2016-02-14 ENCOUNTER — Ambulatory Visit (HOSPITAL_COMMUNITY): Payer: Self-pay | Admitting: Psychiatry

## 2016-02-17 NOTE — Progress Notes (Signed)
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  Coarc repair site OK     Palpitations  Fleeting     Outpatient Medications Prior to Visit  Medication Sig Dispense Refill  . albuterol (PROVENTIL) (2.5 MG/3ML) 0.083% nebulizer solution 2.5 mg.    . amphetamine-dextroamphetamine (ADDERALL XR) 20 MG 24 hr capsule Take 1 capsule (20 mg total) by mouth daily. 30 capsule 0  . beclomethasone (QVAR) 80 MCG/ACT inhaler Inhale 2 puffs into the lungs 2 (two) times daily. 1 Inhaler 3  . clobetasol cream (TEMOVATE) AB-123456789 % Apply 1 application topically 2 (two) times daily as needed (eczema rash).    . DEXILANT 60 MG capsule TAKE 1 CAPSULE BY MOUTH EVERY DAY 30 capsule 11  . FLUoxetine (PROZAC) 40 MG capsule Take 1 capsule (40 mg total) by mouth daily. 30 capsule 1  . fluticasone (FLONASE) 50 MCG/ACT nasal spray USE 2 SPRAYS IN EACH NOSTRIL EVERY DAY 16 g 2  . gabapentin (NEURONTIN) 300 MG capsule TAKE 2 CAPSULES BY MOUTH EVERY MORNING, 1 CAPSULE EVERY AFTERNOON, AND 2 CAPSULES EVERY EVENING 150 capsule 0  . HYDROcodone-acetaminophen (NORCO) 7.5-325 MG tablet One po qd prn pain 30 tablet 0  . lamoTRIgine (LAMICTAL) 150 MG tablet TAKE 1 TABLET BY MOUTH 2 TIMES DAILY 60 tablet 0  . LORazepam (ATIVAN) 0.5 MG tablet Take 1-2 tablets by mouth as needed prior to MRA 3 tablet 0  . medroxyPROGESTERone (DEPO-PROVERA) 150 MG/ML injection Inject 1 mL (150 mg total) into the muscle every 3 (three) months. 1 mL 11  . metoCLOPramide (REGLAN) 10 MG tablet Take 1 tablet (10 mg total) by mouth 3 (three) times daily as needed for nausea  (headache / nausea). 20 tablet 0  . traZODone (DESYREL) 50 MG tablet Take 1 tablet (50 mg total) by mouth at bedtime as needed. for sleep 30 tablet 0   No facility-administered medications prior to visit.      Allergies:   Sulfonamide derivatives and Coconut flavor   Past Medical History:  Diagnosis Date  . ADHD (attention deficit hyperactivity disorder)   . Anxiety   . Asthma    daily and prn inhalers  . Bipolar affective disorder ([place])   . Dental crowns present   . Depression   . Deviated nasal septum 05/2012  . Eczema    right leg  . Esophageal reflux   . Fibromyalgia   . Hypertension   . IBS (irritable bowel syndrome)   . Migraine headache   . Nasal turbinate hypertrophy 05/2012   bilateral  . TMJ syndrome     Past Surgical History:  Procedure Laterality Date  . ASD AND VSD REPAIR  1989  . CARDIAC CATHETERIZATION  06/18/2006  . CESAREAN SECTION  04/12/2009  . COARCTATION OF AORTA REPAIR  1989  . COLONOSCOPY N/A 05/20/2013   Procedure: COLONOSCOPY;  Surgeon: Danie Binder, MD;  [facility];  Service: Endoscopy;  Laterality: N/A;  115-moved to [place] notified pt  . ESOPHAGOGASTRODUODENOSCOPY  04/04/2008     Normal esophagus/Mild patchy erythema in the antrum/ Normal duodenal bulb, normal small bowel biopsy  . FLEXIBLE SIGMOIDOSCOPY  04/04/2008     Small internal hemorrhoids (poor bowel prep)  . HEMORRHOID BANDING N/A 05/20/2013   Procedure: HEMORRHOID BANDING;  Surgeon: Danie Binder, MD;  [facility];  Service: Endoscopy;  Laterality: N/A;  . NASAL SEPTOPLASTY W/ TURBINOPLASTY Bilateral 05/31/2012   Procedure: NASAL SEPTOPLASTY WITH TURBINATE REDUCTION;  Surgeon: Ascencion Dike, MD;  Location: [facility];  Service: ENT;  Laterality: Bilateral;     Social History:  The patient  reports that she has been smoking Cigarettes.  She started smoking about 13 years ago. She has a 5.00 pack-year smoking history. She has never used  smokeless tobacco. She reports that she does not drink alcohol or use drugs.   Family History:  The patient's family history includes ADD / ADHD in her brother, brother, and sister; Alcohol abuse in her father; Anxiety disorder in her brother; Bipolar disorder in her father and paternal grandmother; Dementia in her maternal grandmother and paternal grandmother; Depression in her mother; Emphysema in her paternal grandmother; Heart disease in her maternal grandmother and paternal grandmother; Hyperlipidemia in her mother; Hypertension in her mother; Personality disorder in her father; Physical abuse in her mother; Prostate cancer in her maternal grandfather; Seizures in her daughter; Sexual abuse in her mother; Stroke in her maternal grandfather.    ROS:  Please see the history of present illness. All other systems are reviewed and  Negative to the above problem except as noted.    PHYSICAL EXAM: VS:  BP 126/74   Pulse 75   Ht 5\' 5"  (1.651 m)   Wt 191 lb 12.8 oz (87 kg)   BMI 31.92 kg/m   GEN: Well nourished, well developed, in no acute distress  HEENT: normal  Neck: no JVD, carotid bruits, or masses Cardiac: RRR; no murmurs, rubs, or gallops,no edema  Respiratory:  clear to auscultation bilaterally, normal work of breathing GI: soft, nontender, nondistended, + BS  No hepatomegaly  MS: no deformity Moving all extremities   Skin: warm and dry, no rash Neuro:  Strength and sensation are intact Psych: euthymic mood, full affect   EKG:  EKG is ordered   Ectopic atrial rhythm  75 bpm  First degree AV block  PR 202 msec    Lipid Panel    Component Value Date/Time   CHOL  02/06/2007 0404    139        ATP III CLASSIFICATION:  <200     mg/dL   Desirable  200-239  mg/dL   Borderline High  >=240    mg/dL   High   TRIG 72 02/06/2007 0404   HDL 33 (L) 02/06/2007 0404   CHOLHDL 4.2 02/06/2007 0404   VLDL 14 02/06/2007 0404   LDLCALC  02/06/2007 0404    92        Total  Cholesterol/HDL:CHD Risk Coronary Heart Disease Risk Table                     Men   Women  1/2 Average Risk   3.4   3.3      Wt Readings from Last 3 Encounters:  02/18/16 191 lb 12.8 oz (87 kg)  02/13/16 190 lb (86.2 kg)  09/06/15 199 lb 2 oz (90.3 kg)      ASSESSMENT AND PLAN:  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   Coarc repair looks good  There is very mild dilation of aorta.  41 mm  (Stable beningn hepatic leasion noted)   F/U in 1 year        Current medicines are reviewed at length with the patient today.  The patient does not have concerns regarding medicines.  Signed, Dorris Carnes, MD  02/18/2016 11:01 AM    [address], [place], [place]  [id number] Phone: [phone]; Fax: [phone]

## 2016-02-18 ENCOUNTER — Ambulatory Visit (INDEPENDENT_AMBULATORY_CARE_PROVIDER_SITE_OTHER): Payer: Self-pay | Admitting: Internal Medicine

## 2016-02-18 ENCOUNTER — Encounter: Payer: Self-pay | Admitting: Internal Medicine

## 2016-02-18 VITALS — BP 126/74 | HR 75 | Ht 65.0 in | Wt 191.8 lb

## 2016-02-18 DIAGNOSIS — Q249 Congenital malformation of heart, unspecified: Secondary | ICD-10-CM

## 2016-02-18 NOTE — Patient Instructions (Signed)
Your physician recommends that you continue on your current medications as directed. Please refer to the Current Medication list given to you today. Your physician wants you to follow-up in: 1 year (December, 2018) with Dr. Harrington Challenger.  You will receive a reminder letter in the mail two months in advance. If you don't receive a letter, please call our office to schedule the follow-up appointment.

## 2016-02-26 ENCOUNTER — Ambulatory Visit (HOSPITAL_COMMUNITY): Payer: Self-pay | Admitting: Psychiatry

## 2016-02-26 ENCOUNTER — Telehealth: Payer: Self-pay | Admitting: Family Medicine

## 2016-02-26 ENCOUNTER — Telehealth (HOSPITAL_COMMUNITY): Payer: Self-pay

## 2016-02-26 DIAGNOSIS — F900 Attention-deficit hyperactivity disorder, predominantly inattentive type: Secondary | ICD-10-CM

## 2016-02-26 NOTE — Telephone Encounter (Signed)
Only provided 30 day supply.  She should see physician for future refills.

## 2016-02-26 NOTE — Telephone Encounter (Signed)
Medication refill requests - Patient left a message requesting refills of her Trazodone and Adderall.  Patient last seen 08/30/15 and was canceled and rescheduled 11/30/15 and 02/14/16 by office and stated she had to cancel appointment 02/26/16 due to work schedule.  Patient does not currently have an appointment scheduled but requests refills.  Patient stated she would also like Dr. Adele Schilder to refill her Lorazepam as well.

## 2016-02-26 NOTE — Telephone Encounter (Signed)
Patient's Hydrocodone-acetaminophen 7.5-325 was approved via [employer] from 02/15/2016-08/13/2016. Authorization number is [id number].

## 2016-02-28 MED ORDER — TRAZODONE HCL 50 MG PO TABS: 50.0000 mg | ORAL_TABLET | Freq: Every evening | ORAL | 0 refills | Status: DC | PRN

## 2016-02-28 MED ORDER — AMPHETAMINE-DEXTROAMPHET ER 20 MG PO CP24: 20.0000 mg | ORAL_CAPSULE | Freq: Every day | ORAL | 0 refills | Status: DC

## 2016-02-28 NOTE — Addendum Note (Signed)
Addended by: Watt Climes on: 02/28/2016 01:26 PM   Modules accepted: Orders

## 2016-02-28 NOTE — Telephone Encounter (Signed)
Met with Dr.Arfeen who approved a one time refill of patient's Adderall and Trazodone but not her Lorazepam as patient will have to see provider for any further refills.  New Trazodone order e-scribed to patient's [facility] as requested and new Adderall prescription printed for her to pick up both as ordered by Dr. Adele Schilder.  Dr. Adele Schilder reviewed and signed Adderall order.  Telephone call with patient to inform Dr. Adele Schilder filled her Trazodone and Adderall orders with the Adderall order prepared for pick up.  Informed patient she would need to be seen before any further refills would be provided per Dr. Adele Schilder and patient stated understanding.

## 2016-03-04 ENCOUNTER — Telehealth (HOSPITAL_COMMUNITY): Payer: Self-pay | Admitting: Psychiatry

## 2016-03-04 NOTE — Telephone Encounter (Signed)
Kristin Stephens picked up prescription on [id number] lic [id number] dlo

## 2016-03-05 ENCOUNTER — Encounter (HOSPITAL_COMMUNITY): Payer: Self-pay | Admitting: Psychiatry

## 2016-03-05 ENCOUNTER — Ambulatory Visit (INDEPENDENT_AMBULATORY_CARE_PROVIDER_SITE_OTHER): Payer: Self-pay | Admitting: Psychiatry

## 2016-03-05 DIAGNOSIS — Z823 Family history of stroke: Secondary | ICD-10-CM

## 2016-03-05 DIAGNOSIS — Z79899 Other long term (current) drug therapy: Secondary | ICD-10-CM

## 2016-03-05 DIAGNOSIS — Z9889 Other specified postprocedural states: Secondary | ICD-10-CM

## 2016-03-05 DIAGNOSIS — F3131 Bipolar disorder, current episode depressed, mild: Secondary | ICD-10-CM

## 2016-03-05 DIAGNOSIS — Z9102 Food additives allergy status: Secondary | ICD-10-CM

## 2016-03-05 DIAGNOSIS — F900 Attention-deficit hyperactivity disorder, predominantly inattentive type: Secondary | ICD-10-CM

## 2016-03-05 DIAGNOSIS — Z8042 Family history of malignant neoplasm of prostate: Secondary | ICD-10-CM

## 2016-03-05 DIAGNOSIS — F17210 Nicotine dependence, cigarettes, uncomplicated: Secondary | ICD-10-CM

## 2016-03-05 DIAGNOSIS — Z8349 Family history of other endocrine, nutritional and metabolic diseases: Secondary | ICD-10-CM

## 2016-03-05 DIAGNOSIS — Z811 Family history of alcohol abuse and dependence: Secondary | ICD-10-CM

## 2016-03-05 DIAGNOSIS — Z882 Allergy status to sulfonamides status: Secondary | ICD-10-CM

## 2016-03-05 DIAGNOSIS — Z8249 Family history of ischemic heart disease and other diseases of the circulatory system: Secondary | ICD-10-CM

## 2016-03-05 DIAGNOSIS — Z8489 Family history of other specified conditions: Secondary | ICD-10-CM

## 2016-03-05 MED ORDER — AMPHETAMINE-DEXTROAMPHET ER 20 MG PO CP24: 20.0000 mg | ORAL_CAPSULE | Freq: Every day | ORAL | 0 refills | Status: DC

## 2016-03-05 MED ORDER — FLUOXETINE HCL 40 MG PO CAPS: 40.0000 mg | ORAL_CAPSULE | Freq: Every day | ORAL | 2 refills | Status: DC

## 2016-03-05 MED ORDER — LORAZEPAM 0.5 MG PO TABS: 0.5000 mg | ORAL_TABLET | ORAL | 0 refills | Status: DC | PRN

## 2016-03-05 MED ORDER — LAMOTRIGINE 150 MG PO TABS: 150.0000 mg | ORAL_TABLET | Freq: Two times a day (BID) | ORAL | 2 refills | Status: DC

## 2016-03-05 NOTE — Progress Notes (Signed)
BH MD/PA/NP OP Progress Note  03/05/2016 2:02 PM Kristin Stephens  MRN:  [id number]  Chief Complaint:  Chief Complaint    Follow-up     Subjective:  I'm doing good on her medication.  I'm working full time.  HPI: Kristin Stephens came for her follow-up appointment.  She is taking her medication as prescribed.  She has been not seen in a while because she lost her insurance but hoping to get in 4 weeks.  She is now working full-time at [employer].  She had a good Thanksgiving.  She sleeping better.  She really takes trazodone but like to have a prescription as she is out of refills.  Patient denies any irritability, anger, mania or any psychosis.  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  She was told no immediate intervention but she will require annual MRA she had a good Thanksgiving.  Her mother lives in [place] and her brother is trying to establish business in [place] area.  In the future she wants to move there .  Her 71-year-old daughter still living at her sister's house in [place].  She does face time every day.  Recently her sister was seen by neurology.  She has history of seizures and she is glad that she is seizure free for a while and her medicine has been discontinued.  Patient is compliant with Lamictal, Prozac and Adderall.  She rarely takes lorazepam.  Her attention and focus is good.  She denies any feeling of hopelessness or worthlessness.  Her appetite is okay.  Her vital signs are stable.    Visit Diagnosis:    ICD-9-CM ICD-10-CM   1. Bipolar affective disorder, currently depressed, mild (HCC) 296.51 F31.31 lamoTRIgine (LAMICTAL) 150 MG tablet     FLUoxetine (PROZAC) 40 MG capsule     LORazepam (ATIVAN) 0.5 MG tablet  2. Attention deficit hyperactivity disorder (ADHD), predominantly inattentive type 314.00 F90.0 amphetamine-dextroamphetamine (ADDERALL XR) 20 MG 24 hr capsule     DISCONTINUED: amphetamine-dextroamphetamine (ADDERALL XR) 20 MG 24 hr capsule    Past Psychiatric  History: Patient has history of mood swing, depression, anger and she was diagnosed with bipolar and ADD.  She has a history of self abusive behavior by cutting her wrist with a razor.  In the past she had tried Wellbutrin but limited response.  She has intensive outpatient program in [place].  She denies any inpatient psychiatric treatment .    Past Medical History:  Patient has history of ASD, VSD repair, Caucasian of aorta repair, cardiac catheterization , GERD, migraine headaches , hypertension , irritable bowel syndrome .   Past Medical History:  Diagnosis Date  . ADHD (attention deficit hyperactivity disorder)   . Anxiety   . Asthma    daily and prn inhalers  . Bipolar affective disorder ([place])   . Dental crowns present   . Depression   . Deviated nasal septum 05/2012  . Eczema    right leg  . Esophageal reflux   . Fibromyalgia   . Hypertension   . IBS (irritable bowel syndrome)   . Migraine headache   . Nasal turbinate hypertrophy 05/2012   bilateral  . TMJ syndrome     Past Surgical History:  Procedure Laterality Date  . ASD AND VSD REPAIR  1989  . CARDIAC CATHETERIZATION  06/18/2006  . CESAREAN SECTION  04/12/2009  . COARCTATION OF AORTA REPAIR  1989  . COLONOSCOPY N/A 05/20/2013   Procedure: COLONOSCOPY;  Surgeon: Marga Melnick  Fields, MD;  [facility];  Service: Endoscopy;  Laterality: N/A;  115-moved to [place] notified pt  . ESOPHAGOGASTRODUODENOSCOPY  04/04/2008     Normal esophagus/Mild patchy erythema in the antrum/ Normal duodenal bulb, normal small bowel biopsy  . FLEXIBLE SIGMOIDOSCOPY  04/04/2008     Small internal hemorrhoids (poor bowel prep)  . HEMORRHOID BANDING N/A 05/20/2013   Procedure: HEMORRHOID BANDING;  Surgeon: Danie Binder, MD;  [facility];  Service: Endoscopy;  Laterality: N/A;  . NASAL SEPTOPLASTY W/ TURBINOPLASTY Bilateral 05/31/2012   Procedure: NASAL SEPTOPLASTY WITH TURBINATE REDUCTION;  Surgeon: Ascencion Dike, MD;   Location: [facility];  Service: ENT;  Laterality: Bilateral;    Family Psychiatric History: See below   Family History:  Family History  Problem Relation Age of Onset  . Hyperlipidemia Mother   . Hypertension Mother   . Depression Mother   . Physical abuse Mother   . Sexual abuse Mother     possibly  . Emphysema Paternal Grandmother   . Heart disease Paternal Grandmother   . Bipolar disorder Paternal Grandmother   . Dementia Paternal Grandmother   . Heart disease Maternal Grandmother   . Dementia Maternal Grandmother   . Stroke Maternal Grandfather   . Prostate cancer Maternal Grandfather   . Personality disorder Father   . Bipolar disorder Father   . Alcohol abuse Father   . ADD / ADHD Sister   . Anxiety disorder Brother   . ADD / ADHD Brother   . ADD / ADHD Brother   . Seizures Daughter   . Drug abuse Neg Hx   . OCD Neg Hx   . Paranoid behavior Neg Hx   . Schizophrenia Neg Hx     Social History:  Social History   Social History  . Marital status: Single    Spouse name: N/A  . Number of children: 1  . Years of education: N/A   Occupational History  . 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 Smoking status: Current Every Day Smoker    Packs/day: 0.50    Years: 10.00    Types: Cigarettes    Start date: 02/27/2002  . Smokeless tobacco: Never Used     Comment: quit smoking in 2010 during pregnacy then started back Feb. 2011   . 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 Drug use: No     Comment: marijuana and pain pills; 12/08/07-smoked x 1 month ago- this was the first time in 2.years.  Morphine Sulfate and oxycontin - none since age 31  . Sexual activity: Yes    Birth control/ protection: Injection, Spermicide   Other Topics Concern  . Not on file   Social History Narrative   Lives with mother and 2 1/2 yr old daughter. LIKES TO WATCH DR. PIMPLE POPPER. AT AGE 23  LIKED TO PICK HER NOSE AND EAT THE DRIED MUCOUS.     Allergies:  Allergies  Allergen Reactions  . Sulfonamide Derivatives Swelling    SWELLING OF EYES WITH OPHTHALMIC SULFA  . Coconut Flavor Rash    Metabolic Disorder Labs: Lab Results  Component Value Date   HGBA1C 5.0 07/17/2014   MPG 97 08/28/2011   No results found for: PROLACTIN Lab Results  Component Value Date   CHOL  02/06/2007    139  ATP III CLASSIFICATION:  <200     mg/dL   Desirable  200-239  mg/dL   Borderline High  >=240    mg/dL   High   TRIG 72 02/06/2007   HDL 33 (L) 02/06/2007   CHOLHDL 4.2 02/06/2007   VLDL 14 02/06/2007   LDLCALC  02/06/2007    92        Total Cholesterol/HDL:CHD Risk Coronary Heart Disease Risk Table                     Men   Women  1/2 Average Risk   3.4   3.3     Current Medications: Current Outpatient Prescriptions  Medication Sig Dispense Refill  . albuterol (PROVENTIL) (2.5 MG/3ML) 0.083% nebulizer solution 2.5 mg.    . amphetamine-dextroamphetamine (ADDERALL XR) 20 MG 24 hr capsule Take 1 capsule (20 mg total) by mouth daily. Do not refill until 03/2016 30 capsule 0  . beclomethasone (QVAR) 80 MCG/ACT inhaler Inhale 2 puffs into the lungs 2 (two) times daily. 1 Inhaler 3  . clobetasol cream (TEMOVATE) AB-123456789 % Apply 1 application topically 2 (two) times daily as needed (eczema rash).    Marland Kitchen FLUoxetine (PROZAC) 40 MG capsule Take 1 capsule (40 mg total) by mouth daily. 30 capsule 2  . fluticasone (FLONASE) 50 MCG/ACT nasal spray USE 2 SPRAYS IN EACH NOSTRIL EVERY DAY 16 g 2  . gabapentin (NEURONTIN) 300 MG capsule TAKE 2 CAPSULES BY MOUTH EVERY MORNING, 1 CAPSULE EVERY AFTERNOON, AND 2 CAPSULES EVERY EVENING 150 capsule 0  . HYDROcodone-acetaminophen (NORCO) 7.5-325 MG tablet One po qd prn pain 30 tablet 0  . lamoTRIgine (LAMICTAL) 150 MG tablet Take 1 tablet (150 mg total) by mouth 2 (two) times daily. 60 tablet 2  . LORazepam (ATIVAN) 0.5 MG tablet Take 1 tablet (0.5 mg  total) by mouth as needed for anxiety. 10 tablet 0  . medroxyPROGESTERone (DEPO-PROVERA) 150 MG/ML injection Inject 1 mL (150 mg total) into the muscle every 3 (three) months. 1 mL 11  . metoCLOPramide (REGLAN) 10 MG tablet Take 1 tablet (10 mg total) by mouth 3 (three) times daily as needed for nausea (headache / nausea). 20 tablet 0  . traZODone (DESYREL) 50 MG tablet Take 1 tablet (50 mg total) by mouth at bedtime as needed. for sleep 30 tablet 0   No current facility-administered medications for this visit.     Neurologic: Headache: No Seizure: No Paresthesias: No  Musculoskeletal: Strength & Muscle Tone: within normal limits Gait & Station: normal Patient leans: N/A  Psychiatric Specialty Exam: Review of Systems  Constitutional: Negative.   HENT: Negative.   Musculoskeletal: Negative.   Skin: Negative.   Neurological: Negative.     Blood pressure 120/80, pulse (!) 119, height 5\' 5"  (1.651 m), weight 196 lb 12.8 oz (89.3 kg).Body mass index is 32.75 kg/m.  General Appearance: Casual  Eye Contact:  Good  Speech:  Slow  Volume:  Normal  Mood:  Euthymic  Affect:  Congruent  Thought Process:  Goal Directed  Orientation:  Full (Time, Place, and Person)  Thought Content: WDL and Logical   Suicidal Thoughts:  No  Homicidal Thoughts:  No  Memory:  Immediate;   Good Recent;   Good Remote;   Good  Judgement:  Good  Insight:  Good  Psychomotor Activity:  Normal  Concentration:  Concentration: Good and Attention Span: Good  Recall:  Good  Fund of Knowledge: Good  Language: Good  Akathisia:  No  Handed:  Right  AIMS (if indicated):  None reported   Assets:  Communication Skills Desire for Improvement Housing Physical Health  ADL's:  Intact  Cognition: WNL  Sleep:  fair     Treatment Plan Summary:Donnalyn 30 year old female who has history of bipolar disorder, ADHD and anxiety disorder came for her follow-up appointment.  Patient is doing better on her current  psychiatric medication.  She has no rash, itching with Lamictal.  I will continue 150 mg twice a day.  I will also continue Prozac 40 mg daily and Adderall 20 mg daily.  She has no tremors shakes or any EPS.  She will require a new discussion of lorazepam as needed and we will provide only 10 tablets.  She has no refill remaining on her trazodone which she takes rarely.  We will provide trazodone to take as needed for insomnia.  Discussed medication side effects and benefits.  Recommended to call us back if she has any question, concern if she feels worsening of the symptom.  Follow-up in 3 months.   Mukund Weinreb T., MD 03/05/2016, 2:02 PM

## 2016-03-07 ENCOUNTER — Ambulatory Visit: Payer: Medicaid Other | Admitting: Nurse Practitioner

## 2016-03-07 ENCOUNTER — Ambulatory Visit: Payer: Medicaid Other | Admitting: Family Medicine

## 2016-03-17 ENCOUNTER — Ambulatory Visit (HOSPITAL_COMMUNITY)
Admission: EM | Admit: 2016-03-17 | Discharge: 2016-03-17 | Disposition: A | Discharge status: Home / Self Care | Payer: Self-pay | Attending: Family Medicine | Admitting: Family Medicine

## 2016-03-17 ENCOUNTER — Encounter (HOSPITAL_COMMUNITY): Payer: Self-pay

## 2016-03-17 DIAGNOSIS — M7652 Patellar tendinitis, left knee: Secondary | ICD-10-CM

## 2016-03-17 MED ORDER — FLUTICASONE PROPIONATE 0.05 % EX CREA: TOPICAL_CREAM | Freq: Two times a day (BID) | CUTANEOUS | 0 refills | Status: DC

## 2016-03-17 MED ORDER — DICLOFENAC POTASSIUM 50 MG PO TABS: 50.0000 mg | ORAL_TABLET | Freq: Three times a day (TID) | ORAL | 1 refills | Status: DC

## 2016-03-17 NOTE — ED Triage Notes (Signed)
Patient presents to [facility] with complaints of left knee pain x2 weeks, patient states the knee gives out frequently, pt has hx of fibromyalgia

## 2016-03-17 NOTE — Discharge Instructions (Signed)
Ice ,medication, wear sleeve, do exercises and see orthopedist as discussed.

## 2016-03-17 NOTE — ED Provider Notes (Signed)
[place]    CSN: [id number] Arrival date & time: 03/17/16  1306     History   Chief Complaint Chief Complaint  Patient presents with  . Knee Pain    HPI Kristin HEIRONIMUS is a 30 y.o. female.   The history is provided by the patient.  Knee Pain  Location:  Knee Time since incident:  2 weeks Injury: no   Knee location:  L knee Pain details:    Quality:  Sharp   Radiates to:  Does not radiate   Severity:  Mild   Onset quality:  Sudden Chronicity:  Recurrent (giving out sensation.) Dislocation: no   Prior injury to area:  No Exacerbated by: stairs and squatting. Associated symptoms: no back pain, no decreased ROM, no itching, no numbness, no stiffness and no swelling     Past Medical History:  Diagnosis Date  . ADHD (attention deficit hyperactivity disorder)   . Anxiety   . Asthma    daily and prn inhalers  . Bipolar affective disorder ([place])   . Dental crowns present   . Depression   . Deviated nasal septum 05/2012  . Eczema    right leg  . Esophageal reflux   . Fibromyalgia   . Hypertension   . IBS (irritable bowel syndrome)   . Migraine headache   . Nasal turbinate hypertrophy 05/2012   bilateral  . TMJ syndrome     Patient Active Problem List   Diagnosis Date Noted  . Bipolar 2 disorder ([place]) 05/16/2014  . Internal hemorrhoids with complication 99991111  . Vasomotor rhinitis 04/15/2013  . Gastritis without bleeding 03/10/2013  . Anal fissure 03/10/2013  . Obsessive compulsive disorder 03/07/2013  . Fibromyalgia 02/06/2013  . Detachment of glenoid labrum 01/13/2013  . Difficulty in walking(719.7) 02/18/2012  . Seasonal affective disorder ([place]) 11/25/2011  . Asthma, intrinsic 08/28/2010  . ATTENTION DEFICIT DISORDER, ADULT 01/19/2008  . RHINITIS MEDICAMENTOSA 12/08/2007  . ALLERGIC RHINITIS 12/08/2007  . IBS 12/08/2007  . ECZEMA 12/08/2007  . 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 Migraine headache 04/24/2007  . Essential  hypertension 04/24/2007  . ASVD 04/24/2007  . GERD 04/24/2007  . VSD 04/24/2007  . 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 ASD AND VSD REPAIR  1989  . CARDIAC CATHETERIZATION  06/18/2006  . CESAREAN SECTION  04/12/2009  . COARCTATION OF AORTA REPAIR  1989  . COLONOSCOPY N/A 05/20/2013   Procedure: COLONOSCOPY;  Surgeon: Danie Binder, MD;  [facility];  Service: Endoscopy;  Laterality: N/A;  115-moved to [place] notified pt  . ESOPHAGOGASTRODUODENOSCOPY  04/04/2008     Normal esophagus/Mild patchy erythema in the antrum/ Normal duodenal bulb, normal small bowel biopsy  . FLEXIBLE SIGMOIDOSCOPY  04/04/2008     Small internal hemorrhoids (poor bowel prep)  . HEMORRHOID BANDING N/A 05/20/2013   Procedure: HEMORRHOID BANDING;  Surgeon: Danie Binder, MD;  [facility];  Service: Endoscopy;  Laterality: N/A;  . NASAL SEPTOPLASTY W/ TURBINOPLASTY Bilateral 05/31/2012   Procedure: NASAL SEPTOPLASTY WITH TURBINATE REDUCTION;  Surgeon: Ascencion Dike, MD;  Location: [facility];  Service: ENT;  Laterality: Bilateral;    OB History    Gravida Para Term Preterm AB Living   1 1 1     1    SAB TAB Ectopic Multiple Live Births  Home Medications    Prior to Admission medications   Medication Sig Start Date End Date Taking? Authorizing Provider  albuterol (PROVENTIL) (2.5 MG/3ML) 0.083% nebulizer solution 2.5 mg.   Yes Historical Provider, MD  amphetamine-dextroamphetamine (ADDERALL XR) 20 MG 24 hr capsule Take 1 capsule (20 mg total) by mouth daily. Do not refill until 03/2016 03/05/16 03/05/17 Yes Kathlee Nations, MD  FLUoxetine (PROZAC) 40 MG capsule Take 1 capsule (40 mg total) by mouth daily. 03/05/16  Yes Kathlee Nations, MD  fluticasone (FLONASE) 50 MCG/ACT nasal spray USE 2 SPRAYS IN EACH NOSTRIL EVERY DAY 01/30/16  Yes Mikey Kirschner, MD  gabapentin (NEURONTIN) 300 MG capsule TAKE 2  CAPSULES BY MOUTH EVERY MORNING, 1 CAPSULE EVERY AFTERNOON, AND 2 CAPSULES EVERY EVENING 01/30/16  Yes Mikey Kirschner, MD  HYDROcodone-acetaminophen [facility]) 7.5-325 MG tablet One po qd prn pain 02/13/16  Yes Nilda Simmer, NP  lamoTRIgine (LAMICTAL) 150 MG tablet Take 1 tablet (150 mg total) by mouth 2 (two) times daily. 03/05/16  Yes Kathlee Nations, MD  LORazepam (ATIVAN) 0.5 MG tablet Take 1 tablet (0.5 mg total) by mouth as needed for anxiety. 03/05/16  Yes Kathlee Nations, MD  traZODone (DESYREL) 50 MG tablet Take 1 tablet (50 mg total) by mouth at bedtime as needed. for sleep 02/28/16  Yes Kathlee Nations, MD  beclomethasone (QVAR) 80 MCG/ACT inhaler Inhale 2 puffs into the lungs 2 (two) times daily. 07/04/13   Kathee Delton, MD  clobetasol cream (TEMOVATE) [id number] % Apply 1 application topically 2 (two) times daily as needed (eczema rash).    Historical Provider, MD  medroxyPROGESTERone (DEPO-PROVERA) 150 MG/ML injection Inject 1 mL (150 mg total) into the muscle every 3 (three) months. 05/09/15   Nilda Simmer, NP  metoCLOPramide (REGLAN) 10 MG tablet Take 1 tablet (10 mg total) by mouth 3 (three) times daily as needed for nausea (headache / nausea). 04/15/13   Noemi Chapel, MD    Family History Family History  Problem Relation Age of Onset  . Hyperlipidemia Mother   . Hypertension Mother   . Depression Mother   . Physical abuse Mother   . Sexual abuse Mother     possibly  . Emphysema Paternal Grandmother   . Heart disease Paternal Grandmother   . Bipolar disorder Paternal Grandmother   . Dementia Paternal Grandmother   . Heart disease Maternal Grandmother   . Dementia Maternal Grandmother   . Stroke Maternal Grandfather   . Prostate cancer Maternal Grandfather   . Personality disorder Father   . Bipolar disorder Father   . Alcohol abuse Father   . ADD / ADHD Sister   . Anxiety disorder Brother   . ADD / ADHD Brother   . ADD / ADHD Brother   . Seizures Daughter   . Drug abuse  Neg Hx   . OCD Neg Hx   . Paranoid behavior Neg Hx   . Schizophrenia Neg Hx     Social History Social History  Substance Use Topics  . Smoking status: Current Every Day Smoker    Packs/day: 0.50    Years: 10.00    Types: Cigarettes    Start date: 02/27/2002  . Smokeless tobacco: Never Used     Comment: quit smoking in 2010 during pregnacy then started back Feb. 2011   . 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   Musculoskeletal: Positive for gait problem. Negative for back pain, joint swelling, myalgias and stiffness.  Skin: Negative for itching.  All other systems reviewed and are negative.    Physical Exam Triage Vital Signs ED Triage Vitals  Enc Vitals Group     BP 03/17/16 1357 124/65     Pulse Rate 03/17/16 1357 101     Resp 03/17/16 1357 17     Temp 03/17/16 1357 98.7 F (37.1 C)     Temp Source 03/17/16 1357 Oral     SpO2 03/17/16 1357 98 %     Weight --      Height --      Head Circumference --      Peak Flow --      Pain Score 03/17/16 1359 7     Pain Loc --      Pain Edu? --      Excl. in [place]? --    No data found.   Updated Vital Signs BP 124/65 (BP Location: Right Arm)   Pulse 101   Temp 98.7 F (37.1 C) (Oral)   Resp 17   SpO2 98%   Visual Acuity Right Eye Distance:   Left Eye Distance:   Bilateral Distance:    Right Eye Near:   Left Eye Near:    Bilateral Near:     Physical Exam  Constitutional: She is oriented to person, place, and time. She appears well-developed and well-nourished.  Musculoskeletal: She exhibits tenderness. She exhibits no deformity.       Left knee: She exhibits abnormal patellar mobility. She exhibits normal range of motion and no swelling. Tenderness found. Patellar tendon tenderness noted.  Neurological: She is alert and oriented to person, place,  and time.  Skin: Skin is warm and dry.  Nursing note and vitals reviewed.    UC Treatments / Results  Labs (all labs ordered are listed, but only abnormal results are displayed) Labs Reviewed - No data to display  EKG  EKG Interpretation None       Radiology No results found.  Procedures Procedures (including critical care time)  Medications Ordered in UC Medications - No data to display   Initial Impression / Assessment and Plan / UC Course  I have reviewed the triage vital signs and the nursing notes.  Pertinent labs & imaging results that were available during my care of the patient were reviewed by me and considered in my medical decision making (see chart for details).  Clinical Course       Final Clinical Impressions(s) / UC Diagnoses   Final diagnoses:  None    New Prescriptions New Prescriptions   No medications on file     Billy Fischer, MD 03/18/16 2056

## 2016-03-28 ENCOUNTER — Ambulatory Visit (INDEPENDENT_AMBULATORY_CARE_PROVIDER_SITE_OTHER): Payer: Medicaid Other | Admitting: *Deleted

## 2016-03-28 DIAGNOSIS — Z309 Encounter for contraceptive management, unspecified: Secondary | ICD-10-CM | POA: Diagnosis not present

## 2016-03-28 MED ORDER — MEDROXYPROGESTERONE ACETATE 150 MG/ML IM SUSP
150.0000 mg | Freq: Once | INTRAMUSCULAR | Status: AC
  Administered 2016-03-28: 150 mg via INTRAMUSCULAR

## 2016-04-23 ENCOUNTER — Ambulatory Visit (HOSPITAL_COMMUNITY): Payer: Self-pay | Admitting: Psychiatry

## 2016-04-28 ENCOUNTER — Other Ambulatory Visit (HOSPITAL_COMMUNITY): Payer: Self-pay

## 2016-04-28 ENCOUNTER — Other Ambulatory Visit (HOSPITAL_COMMUNITY): Payer: Self-pay | Admitting: Psychiatry

## 2016-04-28 DIAGNOSIS — F900 Attention-deficit hyperactivity disorder, predominantly inattentive type: Secondary | ICD-10-CM

## 2016-04-28 MED ORDER — TRAZODONE HCL 50 MG PO TABS: 50.0000 mg | ORAL_TABLET | Freq: Every evening | ORAL | 1 refills | Status: DC | PRN

## 2016-05-02 ENCOUNTER — Encounter (HOSPITAL_COMMUNITY): Payer: Self-pay

## 2016-05-02 ENCOUNTER — Emergency Department (HOSPITAL_COMMUNITY)
Admission: EM | Admit: 2016-05-02 | Discharge: 2016-05-02 | Disposition: A | Discharge status: Home / Self Care | Payer: Self-pay | Attending: Emergency Medicine | Admitting: Emergency Medicine

## 2016-05-02 DIAGNOSIS — F17210 Nicotine dependence, cigarettes, uncomplicated: Secondary | ICD-10-CM | POA: Insufficient documentation

## 2016-05-02 DIAGNOSIS — F909 Attention-deficit hyperactivity disorder, unspecified type: Secondary | ICD-10-CM | POA: Insufficient documentation

## 2016-05-02 DIAGNOSIS — G43909 Migraine, unspecified, not intractable, without status migrainosus: Secondary | ICD-10-CM | POA: Insufficient documentation

## 2016-05-02 DIAGNOSIS — I10 Essential (primary) hypertension: Secondary | ICD-10-CM | POA: Insufficient documentation

## 2016-05-02 DIAGNOSIS — J45909 Unspecified asthma, uncomplicated: Secondary | ICD-10-CM | POA: Insufficient documentation

## 2016-05-02 LAB — I-STAT BETA HCG BLOOD, ED (MC, WL, AP ONLY): I-stat hCG, quantitative: <5 m[IU]/mL (ref <5)

## 2016-05-02 MED ORDER — KETOROLAC TROMETHAMINE 30 MG/ML IJ SOLN
30.0000 mg | Freq: Once | INTRAMUSCULAR | Status: AC
  Administered 2016-05-02: 30 mg via INTRAVENOUS
  Filled 2016-05-02: qty 1

## 2016-05-02 MED ORDER — DIPHENHYDRAMINE HCL 50 MG/ML IJ SOLN
25.0000 mg | Freq: Once | INTRAMUSCULAR | Status: AC
  Administered 2016-05-02: 25 mg via INTRAVENOUS
  Filled 2016-05-02: qty 1

## 2016-05-02 MED ORDER — PROCHLORPERAZINE EDISYLATE 5 MG/ML IJ SOLN
10.0000 mg | Freq: Once | INTRAMUSCULAR | Status: AC
  Administered 2016-05-02: 10 mg via INTRAVENOUS
  Filled 2016-05-02: qty 2

## 2016-05-02 MED ORDER — SODIUM CHLORIDE 0.9 % IV BOLUS (SEPSIS)
1000.0000 mL | Freq: Once | INTRAVENOUS | Status: AC
  Administered 2016-05-02: 1000 mL via INTRAVENOUS

## 2016-05-02 MED ORDER — DEXAMETHASONE SODIUM PHOSPHATE 10 MG/ML IJ SOLN
10.0000 mg | Freq: Once | INTRAMUSCULAR | Status: AC
  Administered 2016-05-02: 10 mg via INTRAVENOUS
  Filled 2016-05-02: qty 1

## 2016-05-02 MED ORDER — PROCHLORPERAZINE MALEATE 10 MG PO TABS: 10.0000 mg | ORAL_TABLET | Freq: Three times a day (TID) | ORAL | 0 refills | Status: DC | PRN

## 2016-05-02 NOTE — ED Provider Notes (Signed)
[place] DEPT Provider Note   CSN: [id number] Arrival date & time: 05/02/16  1442     History   Chief Complaint Chief Complaint  Patient presents with  . Migraine  . Nausea    HPI Kristin Stephens is a 31 y.o. female.  HPI   31 year old female who presents with headache. History of migraines, fibromyalgia, bipolar disorder. Onset of generalized headache that was mild starting 8 days ago. Headache gradually worsening and now involving right side of temple and behind the right eye 9/10 in severity. Pain worse after taking vicoden. Not improved after just taking tylenol. Associated with nausea and vomiting today. Associated with photophobia. No fever or recent URI or GI illness. No double vision, speech changes, focal numbness/weakness, neck pain or stiffness. With increased stresses at home. Headache feels same as previous migraine headaches.  Past Medical History:  Diagnosis Date  . ADHD (attention deficit hyperactivity disorder)   . Anxiety   . Asthma    daily and prn inhalers  . Bipolar affective disorder ([place])   . Dental crowns present   . Depression   . Deviated nasal septum 05/2012  . Eczema    right leg  . Esophageal reflux   . Fibromyalgia   . Hypertension   . IBS (irritable bowel syndrome)   . Migraine headache   . Nasal turbinate hypertrophy 05/2012   bilateral  . TMJ syndrome     Patient Active Problem List   Diagnosis Date Noted  . Bipolar 2 disorder ([place]) 05/16/2014  . Internal hemorrhoids with complication 99991111  . Vasomotor rhinitis 04/15/2013  . Gastritis without bleeding 03/10/2013  . Anal fissure 03/10/2013  . Obsessive compulsive disorder 03/07/2013  . Fibromyalgia 02/06/2013  . Detachment of glenoid labrum 01/13/2013  . Difficulty in walking(719.7) 02/18/2012  . Seasonal affective disorder ([place]) 11/25/2011  . Asthma, intrinsic 08/28/2010  . ATTENTION DEFICIT DISORDER, ADULT 01/19/2008  . RHINITIS MEDICAMENTOSA 12/08/2007  .  ALLERGIC RHINITIS 12/08/2007  . IBS 12/08/2007  . ECZEMA 12/08/2007  . 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 Migraine headache 04/24/2007  . Essential hypertension 04/24/2007  . ASVD 04/24/2007  . GERD 04/24/2007  . VSD 04/24/2007  . 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 ASD AND VSD REPAIR  1989  . CARDIAC CATHETERIZATION  06/18/2006  . CESAREAN SECTION  04/12/2009  . COARCTATION OF AORTA REPAIR  1989  . COLONOSCOPY N/A 05/20/2013   Procedure: COLONOSCOPY;  Surgeon: Danie Binder, MD;  [facility];  Service: Endoscopy;  Laterality: N/A;  115-moved to [place] notified pt  . ESOPHAGOGASTRODUODENOSCOPY  04/04/2008     Normal esophagus/Mild patchy erythema in the antrum/ Normal duodenal bulb, normal small bowel biopsy  . FLEXIBLE SIGMOIDOSCOPY  04/04/2008     Small internal hemorrhoids (poor bowel prep)  . HEMORRHOID BANDING N/A 05/20/2013   Procedure: HEMORRHOID BANDING;  Surgeon: Danie Binder, MD;  [facility];  Service: Endoscopy;  Laterality: N/A;  . NASAL SEPTOPLASTY W/ TURBINOPLASTY Bilateral 05/31/2012   Procedure: NASAL SEPTOPLASTY WITH TURBINATE REDUCTION;  Surgeon: Ascencion Dike, MD;  Location: [facility];  Service: ENT;  Laterality: Bilateral;    OB History    Gravida Para Term Preterm AB Living   1 1 1     1    SAB TAB Ectopic Multiple Live Births  Home Medications    Prior to Admission medications   Medication Sig Start Date End Date Taking? Authorizing Provider  albuterol (PROVENTIL) (2.5 MG/3ML) 0.083% nebulizer solution Take 2.5 mg by nebulization every 6 (six) hours as needed for wheezing or shortness of breath.    Yes Historical Provider, MD  amphetamine-dextroamphetamine (ADDERALL XR) 20 MG 24 hr capsule Take 1 capsule (20 mg total) by mouth daily. Do not refill until 03/2016 03/05/16 03/05/17 Yes Kathlee Nations, MD  beclomethasone (QVAR) 80 MCG/ACT  inhaler Inhale 2 puffs into the lungs 2 (two) times daily. Patient taking differently: Inhale 2 puffs into the lungs 2 (two) times daily as needed (SOB, wheezing).  07/04/13  Yes Kathee Delton, MD  FLUoxetine (PROZAC) 40 MG capsule Take 1 capsule (40 mg total) by mouth daily. 03/05/16  Yes Kathlee Nations, MD  gabapentin (NEURONTIN) 300 MG capsule TAKE 2 CAPSULES BY MOUTH EVERY MORNING, 1 CAPSULE EVERY AFTERNOON, AND 2 CAPSULES EVERY EVENING 01/30/16  Yes Mikey Kirschner, MD  HYDROcodone-acetaminophen ([place]) 7.5-325 MG tablet One po qd prn pain Patient taking differently: Take 1 tablet by mouth daily as needed for moderate pain or severe pain.  02/13/16  Yes Nilda Simmer, NP  lamoTRIgine (LAMICTAL) 150 MG tablet Take 1 tablet (150 mg total) by mouth 2 (two) times daily. 03/05/16  Yes Kathlee Nations, MD  LORazepam (ATIVAN) 0.5 MG tablet Take 1 tablet (0.5 mg total) by mouth as needed for anxiety. 03/05/16  Yes Kathlee Nations, MD  medroxyPROGESTERone (DEPO-PROVERA) 150 MG/ML injection Inject 1 mL (150 mg total) into the muscle every 3 (three) months. 05/09/15  Yes Nilda Simmer, NP  traZODone (DESYREL) 50 MG tablet Take 1 tablet (50 mg total) by mouth at bedtime as needed. for sleep 04/28/16  Yes Kathlee Nations, MD  diclofenac (CATAFLAM) 50 MG tablet Take 1 tablet (50 mg total) by mouth 3 (three) times daily. Patient not taking: Reported on 05/02/2016 03/17/16   Billy Fischer, MD  fluticasone (CUTIVATE) 0.05 % cream Apply topically 2 (two) times daily. Patient not taking: Reported on 05/02/2016 03/17/16   Billy Fischer, MD  fluticasone [facility]) 50 MCG/ACT nasal spray USE 2 SPRAYS IN [facility] NOSTRIL EVERY DAY Patient not taking: Reported on 05/02/2016 01/30/16   Mikey Kirschner, MD  prochlorperazine (COMPAZINE) 10 MG tablet Take 1 tablet (10 mg total) by mouth every 8 (eight) hours as needed for nausea or vomiting (migaine headache). 05/02/16   Forde Dandy, MD    Family History Family History  Problem  Relation Age of Onset  . Hyperlipidemia Mother   . Hypertension Mother   . Depression Mother   . Physical abuse Mother   . Sexual abuse Mother     possibly  . Emphysema Paternal Grandmother   . Heart disease Paternal Grandmother   . Bipolar disorder Paternal Grandmother   . Dementia Paternal Grandmother   . Heart disease Maternal Grandmother   . Dementia Maternal Grandmother   . Stroke Maternal Grandfather   . Prostate cancer Maternal Grandfather   . Personality disorder Father   . Bipolar disorder Father   . Alcohol abuse Father   . ADD / ADHD Sister   . Anxiety disorder Brother   . ADD / ADHD Brother   . ADD / ADHD Brother   . Seizures Daughter   . Drug abuse Neg Hx   . OCD Neg Hx   . Paranoid behavior Neg Hx   .  Schizophrenia Neg Hx     Social History Social History  Substance Use Topics  . Smoking status: Current Every Day Smoker    Packs/day: 0.50    Years: 10.00    Types: Cigarettes    Start date: 02/27/2002  . Smokeless tobacco: Never Used     Comment: quit smoking in 2010 during pregnacy then started back Feb. 2011   . 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 Constitutional: non-toxic, and in no acute distress Head: Normocephalic and atraumatic.  Mouth/Throat: Oropharynx is clear and moist.  Neck: Normal range of motion. Neck supple. no mengingismus Cardiovascular: Normal rate and regular rhythm.   Pulmonary/Chest: Effort normal and breath sounds normal.  Abdominal: Soft. There  is no tenderness. There is no rebound and no guarding.  Musculoskeletal: Normal range of motion.  Skin: Skin is warm and dry.  Psychiatric: Cooperative Neurological:  Alert, oriented to person, place, time, and situation. Memory grossly in tact. Fluent speech. No dysarthria or aphasia.  Cranial nerves:  Pupils are symmetric, and reactive to light. EOMI without nystagmus. No gaze deviation. Facial muscles symmetric with activation. Sensation to light touch over face in tact bilaterally. Hearing grossly in tact. Palate elevates symmetrically. Head turn and shoulder shrug are intact. Tongue midline.  Reflexes defered.  Muscle bulk and tone normal. No pronator drift. Moves all extremities symmetrically. Sensation to light touch is in tact throughout in bilateral upper and lower extremities. Coordination reveals no dysmetria with finger to nose.     ED Treatments / Results  Labs (all labs ordered are listed, but only abnormal results are displayed) Labs Reviewed  I-STAT BETA HCG BLOOD, ED ([facility], [facility], [facility] ONLY)    EKG  EKG Interpretation None       Radiology No results found.  Procedures Procedures (including critical care time)  Medications Ordered in ED Medications  ketorolac (TORADOL) 30 MG/ML injection 30 mg (30 mg Intravenous Given 05/02/16 1530)  prochlorperazine (COMPAZINE) injection 10 mg (10 mg Intravenous Given 05/02/16 1530)  sodium chloride 0.9 % bolus 1,000 mL (1,000 mLs Intravenous New Bag/Given 05/02/16 1531)  diphenhydrAMINE (BENADRYL) injection 25 mg (25 mg Intravenous Given 05/02/16 1530)  dexamethasone (DECADRON) injection 10 mg (10 mg Intravenous Given 05/02/16 1530)     Initial Impression / Assessment and Plan / ED Course  I have reviewed the triage vital signs and the nursing notes.  Pertinent labs & imaging results that were available during my care of the patient were reviewed by me and considered in my medical decision making (see chart for details).      31 year old female who presents with typical migraine headache for 8 days. Well-appearing in no acute distress. Vital signs within normal limits. Has a normal neurological exam. No concerns at this time for serious or emergent intracranial processes such as meningitis, subarachnoid hemorrhage or ICH, tumor/mass, or other serious causes. Given  migraine cocktail and will reassess.  4:20PM Feels significantly improved after migraine cocktail.  Severity less than 5/10. She feels comfortable managing symptoms from home.   The patient appears reasonably screened and/or stabilized for discharge and I doubt any other medical condition or other [facility] requiring further screening, evaluation, or treatment in the ED at this time prior to discharge.  Strict return and follow-up instructions reviewed. She expressed understanding of all discharge instructions and felt comfortable with the plan of care.   Final Clinical Impressions(s) / ED Diagnoses   Final diagnoses:  Migraine without status migrainosus, not intractable, unspecified migraine type    New Prescriptions New Prescriptions   PROCHLORPERAZINE (COMPAZINE) 10 MG TABLET    Take 1 tablet (10 mg total) by mouth every 8 (eight) hours as needed for nausea or vomiting (migaine headache).     Forde Dandy, MD 05/02/16 [phone]

## 2016-05-02 NOTE — ED Triage Notes (Signed)
PT RECEIVED FROM HOME VIA EMS C/O A MIGRAINE WITH N/V AND LIGHT SENSITIVITY X8 DAYS.

## 2016-05-02 NOTE — ED Notes (Signed)
Pt being allowed to wait in room until her friend arrives

## 2016-05-02 NOTE — Discharge Instructions (Signed)
Continue ibuprofen and Tylenol as needed for headache. Avoid taking these medications around the clock for more than 3 or 4 days in a row as they can cause bad rebound headaches. Avoid taking your home Vicodin or other narcotic medications, given that these medications will cause rebound headaches.   Return without fail for worsening symptoms, including fever, escalating pain, confusion, intractable vomiting, or any other symptoms concerning to you

## 2016-05-15 ENCOUNTER — Ambulatory Visit: Payer: Medicaid Other | Admitting: Nurse Practitioner

## 2016-05-19 ENCOUNTER — Other Ambulatory Visit (HOSPITAL_COMMUNITY): Payer: Self-pay | Admitting: Psychiatry

## 2016-05-19 DIAGNOSIS — F3131 Bipolar disorder, current episode depressed, mild: Secondary | ICD-10-CM

## 2016-05-23 ENCOUNTER — Encounter: Payer: Self-pay | Admitting: Nurse Practitioner

## 2016-05-23 ENCOUNTER — Ambulatory Visit (INDEPENDENT_AMBULATORY_CARE_PROVIDER_SITE_OTHER): Payer: Self-pay | Admitting: Nurse Practitioner

## 2016-05-23 VITALS — BP 130/74 | Ht 65.0 in | Wt 201.2 lb

## 2016-05-23 DIAGNOSIS — J3489 Other specified disorders of nose and nasal sinuses: Secondary | ICD-10-CM

## 2016-05-23 DIAGNOSIS — M797 Fibromyalgia: Secondary | ICD-10-CM

## 2016-05-23 MED ORDER — HYDROCODONE-ACETAMINOPHEN 7.5-325 MG PO TABS: 1.0000 | ORAL_TABLET | Freq: Every day | ORAL | 0 refills | Status: DC | PRN

## 2016-05-23 MED ORDER — FLUTICASONE PROPIONATE 50 MCG/ACT NA SUSP: 2.0000 | Freq: Every day | NASAL | 2 refills | Status: DC

## 2016-05-23 MED ORDER — GABAPENTIN 300 MG PO CAPS: ORAL_CAPSULE | ORAL | 5 refills | Status: DC

## 2016-05-24 ENCOUNTER — Encounter: Payer: Self-pay | Admitting: Nurse Practitioner

## 2016-05-24 ENCOUNTER — Other Ambulatory Visit (HOSPITAL_COMMUNITY): Payer: Self-pay | Admitting: Psychiatry

## 2016-05-24 DIAGNOSIS — F900 Attention-deficit hyperactivity disorder, predominantly inattentive type: Secondary | ICD-10-CM

## 2016-05-24 NOTE — Progress Notes (Signed)
Subjective:  Presents for recheck on her fibromyalgia. Doing much better on Neurontin. Takes a rare hydrocodone tablet only for severe pain. 30 pills will last her 3 months, would like to cut back since her pain generally improves with warm weather. Worst pain is 6-7/10; after taking pain med 2-3/10 which makes pain more tolerable and allows her to work and perform ADLs. Continues follow up with mental health. Also has a sore area in the left nostril that has been there for weeks. No fever. Patient is living independently and working.  Objective:   BP 130/74   Ht 5\' 5"  (1.651 m)   Wt 201 lb 4 oz (91.3 kg)   BMI 33.49 kg/m  NAD. Alert, oriented. Lungs clear. Heart RRR. Left nostril mild erythema with a small cloudy pustule along the outside wall. Mildly tender to palpation. Culture obtained from the area.  Assessment:   Problem List Items Addressed This Visit      Other   Fibromyalgia - Primary (Chronic)    Other Visit Diagnoses    Internal nasal lesion       Relevant Orders   Wound culture       Plan:   Meds ordered this encounter  Medications  . fluticasone (FLONASE) 50 MCG/ACT nasal spray    Sig: Place 2 sprays into both nostrils daily.    Dispense:  16 g    Refill:  2    This prescription was filled on 01/30/2016. Any refills authorized will be placed on file.    Order Specific Question:   Supervising Provider    Answer:   Mikey Kirschner [[id number]]  . HYDROcodone-acetaminophen ([facility]) 7.5-325 MG tablet    Sig: Take 1 tablet by mouth daily as needed for moderate pain or severe pain.    Dispense:  20 tablet    Refill:  0    Must last 90 days    Order Specific Question:   Supervising Provider    Answer:   Mikey Kirschner [[id number]]  . gabapentin (NEURONTIN) 300 MG capsule    Sig: TAKE 2 CAPSULES BY MOUTH EVERY MORNING, 1 CAPSULE EVERY AFTERNOON, AND 2 CAPSULES EVERY EVENING    Dispense:  150 capsule    Refill:  5    This prescription was filled on 01/30/2016. Any refills  authorized will be placed on file.    Order Specific Question:   Supervising Provider    Answer:   Maggie Font  [place] reviewed. Patient request refill on fluticasone. Nasal culture pending. Use hydrocodone very sparingly. Reviewed addiction potential.   Return in about 4 months (around 09/20/2016) for recheck.

## 2016-05-26 ENCOUNTER — Other Ambulatory Visit: Payer: Self-pay | Admitting: Nurse Practitioner

## 2016-05-26 LAB — WOUND CULTURE: ORGANISM ID, BACTERIA: NONE SEEN

## 2016-05-26 MED ORDER — DOXYCYCLINE HYCLATE 100 MG PO TABS
100.0000 mg | ORAL_TABLET | Freq: Two times a day (BID) | ORAL | 0 refills | Status: DC
Start: 2016-05-26 — End: 2016-06-05

## 2016-06-05 ENCOUNTER — Encounter (HOSPITAL_COMMUNITY): Payer: Self-pay | Admitting: Psychiatry

## 2016-06-05 ENCOUNTER — Ambulatory Visit (INDEPENDENT_AMBULATORY_CARE_PROVIDER_SITE_OTHER): Payer: Self-pay | Admitting: Psychiatry

## 2016-06-05 DIAGNOSIS — F17210 Nicotine dependence, cigarettes, uncomplicated: Secondary | ICD-10-CM

## 2016-06-05 DIAGNOSIS — F3131 Bipolar disorder, current episode depressed, mild: Secondary | ICD-10-CM

## 2016-06-05 DIAGNOSIS — Z810 Family history of intellectual disabilities: Secondary | ICD-10-CM

## 2016-06-05 DIAGNOSIS — F900 Attention-deficit hyperactivity disorder, predominantly inattentive type: Secondary | ICD-10-CM

## 2016-06-05 DIAGNOSIS — Z818 Family history of other mental and behavioral disorders: Secondary | ICD-10-CM

## 2016-06-05 MED ORDER — LAMOTRIGINE 150 MG PO TABS: 150.0000 mg | ORAL_TABLET | Freq: Two times a day (BID) | ORAL | 2 refills | Status: DC

## 2016-06-05 MED ORDER — AMPHETAMINE-DEXTROAMPHET ER 20 MG PO CP24: 20.0000 mg | ORAL_CAPSULE | Freq: Every day | ORAL | 0 refills | Status: DC

## 2016-06-05 MED ORDER — FLUOXETINE HCL 40 MG PO CAPS: 40.0000 mg | ORAL_CAPSULE | Freq: Every day | ORAL | 2 refills | Status: DC

## 2016-06-05 MED ORDER — TRAZODONE HCL 50 MG PO TABS: 50.0000 mg | ORAL_TABLET | Freq: Every evening | ORAL | 0 refills | Status: DC | PRN
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
## 2016-06-13 ENCOUNTER — Ambulatory Visit (INDEPENDENT_AMBULATORY_CARE_PROVIDER_SITE_OTHER): Payer: Medicaid Other | Admitting: *Deleted

## 2016-06-13 ENCOUNTER — Other Ambulatory Visit: Payer: Self-pay | Admitting: Family Medicine

## 2016-06-13 DIAGNOSIS — Z3042 Encounter for surveillance of injectable contraceptive: Secondary | ICD-10-CM

## 2016-06-13 MED ORDER — MEDROXYPROGESTERONE ACETATE 150 MG/ML IM SUSP
150.0000 mg | Freq: Once | INTRAMUSCULAR | Status: AC
  Administered 2016-06-13: 150 mg via INTRAMUSCULAR

## 2016-06-23 ENCOUNTER — Telehealth (HOSPITAL_COMMUNITY): Payer: Self-pay

## 2016-06-23 ENCOUNTER — Other Ambulatory Visit (HOSPITAL_COMMUNITY): Payer: Self-pay | Admitting: Psychiatry

## 2016-06-23 DIAGNOSIS — F3131 Bipolar disorder, current episode depressed, mild: Secondary | ICD-10-CM

## 2016-06-23 NOTE — Telephone Encounter (Signed)
I called patient and let her know that I made a referral to Neurology and they should be calling her for an appointment soon.

## 2016-06-23 NOTE — Telephone Encounter (Signed)
If she has memory impairment and then she should see neurologist.

## 2016-06-23 NOTE — Telephone Encounter (Signed)
Patient is calling,  She is very tearful and states she has been loosing chinks of time. She said she went to bed on Friday, and she does not remember the weekend at all, she said she thought today was Sunday when she woke up. Please review and advise, thank you

## 2016-06-25 ENCOUNTER — Other Ambulatory Visit: Payer: Self-pay | Admitting: Family Medicine

## 2016-06-27 ENCOUNTER — Encounter: Payer: Medicaid Other | Admitting: Nurse Practitioner

## 2016-06-30 ENCOUNTER — Other Ambulatory Visit (HOSPITAL_COMMUNITY): Payer: Self-pay

## 2016-06-30 ENCOUNTER — Encounter (HOSPITAL_COMMUNITY): Payer: Self-pay

## 2016-06-30 DIAGNOSIS — F3131 Bipolar disorder, current episode depressed, mild: Secondary | ICD-10-CM

## 2016-06-30 MED ORDER — LORAZEPAM 0.5 MG PO TABS: 0.5000 mg | ORAL_TABLET | ORAL | 0 refills | Status: DC | PRN

## 2016-07-17 ENCOUNTER — Ambulatory Visit: Payer: Medicaid Other | Admitting: Neurology

## 2016-07-31 ENCOUNTER — Encounter: Payer: Self-pay | Admitting: Neurology

## 2016-07-31 ENCOUNTER — Ambulatory Visit (INDEPENDENT_AMBULATORY_CARE_PROVIDER_SITE_OTHER): Payer: Self-pay | Admitting: Neurology

## 2016-07-31 VITALS — BP 139/91 | HR 91 | Ht 65.0 in | Wt 201.6 lb

## 2016-07-31 DIAGNOSIS — R419 Unspecified symptoms and signs involving cognitive functions and awareness: Secondary | ICD-10-CM

## 2016-07-31 MED ORDER — LORAZEPAM 1 MG PO TABS: ORAL_TABLET | ORAL | 0 refills | Status: DC

## 2016-07-31 NOTE — Progress Notes (Signed)
[facility]    Provider:  Dr Jaynee Eagles Referring Provider: Mikey Kirschner, MD Primary Care Physician:  Mikey Kirschner, MD  CC:  Memory loss  HPI:  Kristin Stephens is a 31 y.o. female here as a referral from Dr. Wolfgang Phoenix for memory loss. 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 A month ago she got up and went to work, thought it was a Sunday and it was Monday and she had no idea where Sunday went. Doesn;t remember anything. Her rommate saw her that day, she was home all day, no one talked to her that day. But roomates saw her in the kitchen making food, she was heating something up in the microwave and then got a mountain dew out of the frisge. She went back toher room watching TV. She is fearful she had a psychotic break. Whe she was younger sometimes she would forget the weekend but never had any alteration of awareness per her mom and she acted just fine during the weekends she didn't remember. There was nothing out of place, no urine, she didn;t have as many cigarettes so she smoked. She remembers going to sleep the night before. No calls, no texts, no emails. Was just at home all day and can;t remember. Daughter has epilepsy. No other focal neurologic deficits, associated symptoms, inciting events or modifiable factors.  Reviewed notes, labs and imaging from outside physicians, which showed:  CT head 05/2008 showed No acute intracranial abnormalities including mass lesion or mass effect, hydrocephalus, extra-axial fluid collection, midline shift, hemorrhage, or acute infarction, large ischemic events (personally reviewed images)  Review of Systems: Patient complains of symptoms per HPI as well as the following symptoms: blurred vision, CP, palpitations, murmur, SOB, memory loss, confusion, headache, dizziness, depression,anxiety, decreased energy, racing thoughts. Pertinent negatives per HPI. All others negative.   Social  History   Social History  . Marital status: Single    Spouse name: N/A  . Number of children: 1  . Years of education: GED   Occupational History  . [employer]    Social History Main Topics  . Smoking status: Current Every Day Smoker    Packs/day: 0.50    Years: 10.00    Types: Cigarettes    Start date: 02/27/2002  . Smokeless tobacco: Never Used     Comment: quit smoking in 2010 during pregnacy then started back Feb. 2011   . 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 Drug use: No     Comment: marijuana and pain pills; 12/08/07-smoked x 1 month ago- this was the first time in 2.years.  Morphine Sulfate and oxycontin - none since age 75  . Sexual activity: Yes    Birth control/ protection: Injection, Spermicide   Other Topics Concern  . Not on file   Social History Narrative   Lives with 3 roommates   LIKES TO WATCH DR. PIMPLE POPPER. AT AGE 55 LIKED TO PICK HER NOSE AND EAT THE DRIED MUCOUS.    Caffeine: coffee, soda, occasional Red Bull       Family History  Problem Relation Age of Onset  . Hyperlipidemia Mother   . Hypertension Mother   . Depression Mother   . Physical abuse Mother   . Sexual abuse Mother     possibly  . Sleep apnea Mother   . Emphysema Paternal Grandmother   . Heart disease Paternal Grandmother   .  Bipolar disorder Paternal Grandmother   . Dementia Paternal Grandmother   . Heart disease Maternal Grandmother   . Dementia Maternal Grandmother   . Stroke Maternal Grandfather   . Prostate cancer Maternal Grandfather   . Personality disorder Father   . Bipolar disorder Father   . Alcohol abuse Father   . ADD / ADHD Sister   . Anxiety disorder Brother   . ADD / ADHD Brother   . ADD / ADHD Brother   . Seizures Daughter   . Drug abuse Neg Hx   . OCD Neg Hx   . Paranoid behavior Neg Hx   . Schizophrenia Neg Hx     Past Medical History:  Diagnosis Date  . ADHD (attention deficit hyperactivity disorder)   .  Anxiety   . Asthma    daily and prn inhalers  . Bipolar affective disorder ([place])   . Dental crowns present   . Depression   . Deviated nasal septum 05/2012  . Eczema    right leg  . Esophageal reflux   . Fibromyalgia   . Hypertension   . IBS (irritable bowel syndrome)   . Migraine headache   . Nasal turbinate hypertrophy 05/2012   bilateral  . OCD (obsessive compulsive disorder)   . TMJ syndrome     Past Surgical History:  Procedure Laterality Date  . ASD AND VSD REPAIR  1989  . CARDIAC CATHETERIZATION  06/18/2006  . CESAREAN SECTION  04/12/2009  . COARCTATION OF AORTA REPAIR  1989  . COLONOSCOPY N/A 05/20/2013   Procedure: COLONOSCOPY;  Surgeon: Danie Binder, MD;  [facility];  Service: Endoscopy;  Laterality: N/A;  115-moved to [place] notified pt  . ESOPHAGOGASTRODUODENOSCOPY  04/04/2008     Normal esophagus/Mild patchy erythema in the antrum/ Normal duodenal bulb, normal small bowel biopsy  . FLEXIBLE SIGMOIDOSCOPY  04/04/2008     Small internal hemorrhoids (poor bowel prep)  . HEMORRHOID BANDING N/A 05/20/2013   Procedure: HEMORRHOID BANDING;  Surgeon: Danie Binder, MD;  [facility];  Service: Endoscopy;  Laterality: N/A;  . NASAL SEPTOPLASTY W/ TURBINOPLASTY Bilateral 05/31/2012   Procedure: NASAL SEPTOPLASTY WITH TURBINATE REDUCTION;  Surgeon: Ascencion Dike, MD;  Location: [facility];  Service: ENT;  Laterality: Bilateral;    Current Outpatient Prescriptions  Medication Sig Dispense Refill  . albuterol (PROVENTIL) (2.5 MG/3ML) 0.083% nebulizer solution Take 2.5 mg by nebulization every 6 (six) hours as needed for wheezing or shortness of breath.     . amphetamine-dextroamphetamine (ADDERALL XR) 20 MG 24 hr capsule Take 1 capsule (20 mg total) by mouth daily. 30 capsule 0  . FLUoxetine (PROZAC) 40 MG capsule Take 1 capsule (40 mg total) by mouth daily. 30 capsule 2  . fluticasone (CUTIVATE) 0.05 % cream Apply topically 2 (two)  times daily. 30 g 0  . fluticasone (FLONASE) 50 MCG/ACT nasal spray USE 2 SPRAYS IN EACH NOSTRIL EVERY DAY 16 g 5  . gabapentin (NEURONTIN) 300 MG capsule TAKE 2 CAPSULES BY MOUTH EVERY MORNING, 1 CAPSULE EVERY AFTERNOON, AND 2 CAPSULES EVERY EVENING 150 capsule 5  . HYDROcodone-acetaminophen (NORCO) 7.5-325 MG tablet Take 1 tablet by mouth daily as needed for moderate pain or severe pain. 20 tablet 0  . lamoTRIgine (LAMICTAL) 150 MG tablet Take 1 tablet (150 mg total) by mouth 2 (two) times daily. 60 tablet 2  . medroxyPROGESTERone (DEPO-PROVERA) 400 MG/ML SUSP injection Inject into the muscle once. Every 3 months    .  prochlorperazine (COMPAZINE) 10 MG tablet Take 1 tablet (10 mg total) by mouth every 8 (eight) hours as needed for nausea or vomiting (migaine headache). 10 tablet 0  . traZODone (DESYREL) 50 MG tablet Take 1 tablet (50 mg total) by mouth at bedtime as needed. for sleep 30 tablet 0  . LORazepam (ATIVAN) 1 MG tablet Take 1 tablet 30-60 minutes before MRI may repeat in 30-60 minutes if needed 3 tablet 0   No current facility-administered medications for this visit.     Allergies as of 07/31/2016 - Review Complete 07/31/2016  Allergen Reaction Noted  . Sulfonamide derivatives Swelling   . Coconut flavor Rash 11/10/2011    Vitals: BP (!) 139/91   Pulse 91   Ht 5\' 5"  (1.651 m)   Wt 201 lb 9.6 oz (91.4 kg)   BMI 33.55 kg/m  Last Weight:  Wt Readings from Last 1 Encounters:  07/31/16 201 lb 9.6 oz (91.4 kg)   Last Height:   Ht Readings from Last 1 Encounters:  07/31/16 5\' 5"  (1.651 m)    Physical exam: Exam: Gen: NAD, conversant, well nourised, obese, well groomed                     CV: RRR, no MRG. No Carotid Bruits. No peripheral edema, warm, nontender Eyes: Conjunctivae clear without exudates or hemorrhage  Neuro: Detailed Neurologic Exam  Speech:    Speech is normal; fluent and spontaneous with normal comprehension.  Cognition:    The patient is oriented  to person, place, and time;     recent and remote memory intact;     language fluent;     normal attention, concentration,     fund of knowledge Cranial Nerves:    The pupils are equal, round, and reactive to light. The fundi are normal and spontaneous venous pulsations are present. Visual fields are full to finger confrontation. Extraocular movements are intact. Trigeminal sensation is intact and the muscles of mastication are normal. The face is symmetric. The palate elevates in the midline. Hearing intact. Voice is normal. Shoulder shrug is normal. The tongue has normal motion without fasciculations.   Coordination:    Normal finger to nose and heel to shin. Normal rapid alternating movements.   Gait:    Heel-toe and tandem gait are normal.   Motor Observation:    No asymmetry, no atrophy, and no involuntary movements noted. Tone:    Normal muscle tone.    Posture:    Posture is normal. normal erect    Strength:    Strength is V/V in the upper and lower limbs.      Sensation: intact to LT     Reflex Exam:  DTR's:    Deep tendon reflexes in the upper and lower extremities are normal bilaterally.   Toes:    The toes are downgoing bilaterally.   Clonus:    Clonus is absent.      Assessment/Plan:  31 year old with transient altered awareness. Need to rule out seizure/seizure focus.   MRI brain w/wo contrast EEG; cannot perform an ambulatory eeg due to non-insurance status and she can;t afford it so we will perform multiple EEGs. If EEG unremarkable, then extended sleep deprived eeg.   Patient is unable to drive, operate heavy machinery, perform activities at heights or participate in water activities until 6 months seizure free3 mo  Call [facility] and see if they can get her approved for financial assistance  Orders Placed This  Encounter  Procedures  . MR BRAIN W WO CONTRAST  . Basic Metabolic Panel  . EEG    Cc: Dr. Lavina Hamman, [facility] [address] [place], [place] [address]  Phone [phone] Fax [phone]

## 2016-07-31 NOTE — Patient Instructions (Addendum)
Remember to drink plenty of fluid, eat healthy meals and do not skip any meals. Try to eat protein with a every meal and eat a healthy snack such as fruit or nuts in between meals. Try to keep a regular sleep-wake schedule and try to exercise daily, particularly in the form of walking, 20-30 minutes a day, if you can.   As far as diagnostic testing: MRI Brain, EEG   Our phone number is [phone]. We also have an after hours call service for urgent matters and there is a physician on-call for urgent questions. For any emergencies you know to call 911 or go to the nearest emergency room   Epilepsy Epilepsy is a condition in which a person has repeated seizures over time. A seizure is a sudden burst of abnormal electrical and chemical activity in the brain. Seizures can cause a change in attention, behavior, or the ability to remain awake and alert (altered mental status). Epilepsy increases a person's risk of falls, accidents, and injury. It can also lead to complications, including:  Depression.  Poor memory.  Sudden unexplained death in epilepsy (SUDEP). This complication is rare, and its cause is not known. Most people with epilepsy lead normal lives. What are the causes? This condition may be caused by:  A head injury.  An injury that happens at birth.  A high fever during childhood.  A stroke.  Bleeding that goes into or around the brain.  Certain medicines and drugs.  Having too little oxygen for a long period of time.  Abnormal brain development.  Certain infections, such as meningitis and encephalitis.  Brain tumors.  Conditions that are passed along from parent to child (are hereditary). What are the signs or symptoms? Symptoms of a seizure vary greatly from person to person. They include:  Convulsions.  Stiffening of the body.  Involuntary movements of the arms or legs.  Loss of consciousness.  Breathing problems.  Falling  suddenly.  Confusion.  Head nodding.  Eye blinking or fluttering.  Lip smacking.  Drooling.  Rapid eye movements.  Grunting.  Loss of bladder control and bowel control.  Staring.  Unresponsiveness. Some people have symptoms right before a seizure happens (aura) and right after a seizure happens. Symptoms of an aura include:  Fear or anxiety.  Nausea.  Feeling like the room is spinning (vertigo).  A feeling of having seen or heard something before (deja vu).  Odd tastes or smells.  Changes in vision, such as seeing flashing lights or spots. Symptoms that follow a seizure include:  Confusion.  Sleepiness.  Headache. How is this diagnosed? This condition is diagnosed based on:  Your symptoms.  Your medical history.  A physical exam.  A neurological exam. A neurological exam is similar to a physical exam. It involves checking your strength, reflexes, coordination, and sensations.  Tests, such as:  An electroencephalogram (EEG). This is a painless test that creates a diagram of your brain waves.  An MRI of the brain.  A CT scan of the brain.  A lumbar puncture, also called a spinal tap.  Blood tests to check for signs of infection or abnormal blood chemistry. How is this treated? There is no cure for this condition, but treatment can help control seizures. Treatment may involve:  Taking medicines to control seizures. These include medicines to prevent seizures and medicines to stop seizures as they occur.  Having a device called a vagus nerve stimulator implanted in the chest. The device sends electrical impulses  to the vagus nerve and to the brain to prevent seizures. This treatment may be recommended if medicines do not help.  Brain surgery. There are several kinds of surgeries that may be done to stop seizures from happening or to reduce how often seizures happen.  Having regular blood tests. You may need to have blood tests regularly to check  that you are getting the right amount of medicine. Once this condition has been diagnosed, it is important to begin treatment as soon as possible. For some people, epilepsy eventually goes away. Follow these instructions at home: Medicines    Take over-the-counter and prescription medicines only as told by your health care provider.  Avoid any substances that may prevent your medicine from working properly, such as alcohol. Activity   Get enough rest. Lack of sleep can make seizures more likely to occur.  Follow instructions from your health care provider about driving, swimming, and doing any other activities that would be dangerous if you had a seizure. Educating others  Teach friends and family what to do if you have a seizure. They should:  Lay you on the ground to prevent a fall.  Cushion your head and body.  Loosen any tight clothing around your neck.  Turn you on your side. If vomiting occurs, this helps keep your airway clear.  Stay with you until you recover.  Not hold you down. Holding you down will not stop the seizure.  Not put anything in your mouth.  Know whether or not you need emergency care. General instructions   Avoid anything that has ever triggered a seizure for you.  Keep a seizure diary. Record what you remember about each seizure, especially anything that might have triggered the seizure.  Keep all follow-up visits as told by your health care provider. This is important. Contact a health care provider if:  Your seizure pattern changes.  You have symptoms of infection or another illness. This might increase your risk of having a seizure. Get help right away if:  You have a seizure that does not stop after 5 minutes.  You have several seizures in a row without a complete recovery in between seizures.  You have a seizure that makes it harder to breathe.  You have a seizure that is different from previous seizures.  You have a seizure that  leaves you unable to speak or use a part of your body.  You did not wake up immediately after a seizure. This information is not intended to replace advice given to you by your health care provider. Make sure you discuss any questions you have with your health care provider. Document Released: 03/17/2005 Document Revised: 10/13/2015 Document Reviewed: 09/25/2015 Elsevier Interactive Patient Education  2017 [employer].

## 2016-08-05 ENCOUNTER — Ambulatory Visit (INDEPENDENT_AMBULATORY_CARE_PROVIDER_SITE_OTHER): Payer: Self-pay | Admitting: Psychiatry

## 2016-08-05 ENCOUNTER — Encounter (HOSPITAL_COMMUNITY): Payer: Self-pay | Admitting: Psychiatry

## 2016-08-05 DIAGNOSIS — Z79899 Other long term (current) drug therapy: Secondary | ICD-10-CM

## 2016-08-05 DIAGNOSIS — Z811 Family history of alcohol abuse and dependence: Secondary | ICD-10-CM

## 2016-08-05 DIAGNOSIS — Z818 Family history of other mental and behavioral disorders: Secondary | ICD-10-CM

## 2016-08-05 DIAGNOSIS — F3131 Bipolar disorder, current episode depressed, mild: Secondary | ICD-10-CM

## 2016-08-05 DIAGNOSIS — F900 Attention-deficit hyperactivity disorder, predominantly inattentive type: Secondary | ICD-10-CM

## 2016-08-05 DIAGNOSIS — Z810 Family history of intellectual disabilities: Secondary | ICD-10-CM

## 2016-08-05 DIAGNOSIS — F17210 Nicotine dependence, cigarettes, uncomplicated: Secondary | ICD-10-CM

## 2016-08-05 MED ORDER — AMPHETAMINE-DEXTROAMPHET ER 20 MG PO CP24: 20.0000 mg | ORAL_CAPSULE | Freq: Every day | ORAL | 0 refills | Status: DC

## 2016-08-05 MED ORDER — LAMOTRIGINE 150 MG PO TABS: 150.0000 mg | ORAL_TABLET | Freq: Two times a day (BID) | ORAL | 2 refills | Status: DC

## 2016-08-05 MED ORDER — TRAZODONE HCL 50 MG PO TABS: 50.0000 mg | ORAL_TABLET | Freq: Every evening | ORAL | 1 refills | Status: DC | PRN

## 2016-08-05 MED ORDER — FLUOXETINE HCL 40 MG PO CAPS: 40.0000 mg | ORAL_CAPSULE | Freq: Every day | ORAL | 2 refills | Status: DC

## 2016-08-05 NOTE — Progress Notes (Signed)
[facility] MD/PA/NP OP Progress Note  08/05/2016 4:01 PM Kristin Stephens  MRN:  [id number]  Chief Complaint:  Subjective:  I am doing better.  I recently seen a neurologist and scheduled to have EEG and MRI.  HPI: Patient came for her follow-up appointment.  She is taking Prozac and Lamictal on a regular basis.  She also take Adderall most of the week.  She cut down her trazodone because someone she sleeps very well.  She recently seen neurologist for memory issues and she was told that she may have transient amnesia.  She described she had episodes when she some time forgetful.  She is scheduled to have an MRI and EEG.  Overall she described her mood is as stable.  She is working up to 40 hours a week.  Her headaches are nonexistent.  Her mother is recovering from a car wreck.  Patient does not want to change her medication.  She denies any suicidal thoughts or homicidal thought.  She has no rash, itching, tremors or any shakes.  Patient denies drinking or using any alcohol.  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  Her appetite is okay.  Her vital signs are stable.  Visit Diagnosis:    ICD-9-CM ICD-10-CM   1. Attention deficit hyperactivity disorder (ADHD), predominantly inattentive type 314.00 F90.0 traZODone (DESYREL) 50 MG tablet     amphetamine-dextroamphetamine (ADDERALL XR) 20 MG 24 hr capsule     DISCONTINUED: amphetamine-dextroamphetamine (ADDERALL XR) 20 MG 24 hr capsule     DISCONTINUED: amphetamine-dextroamphetamine (ADDERALL XR) 20 MG 24 hr capsule  2. Bipolar affective disorder, currently depressed, mild (HCC) 296.51 F31.31 lamoTRIgine (LAMICTAL) 150 MG tablet     FLUoxetine (PROZAC) 40 MG capsule    Past Psychiatric History: Reviewed. Patient has history of mood swing, depression, anger and she was diagnosed with bipolar and ADD. She has a history of self abusive behavior by cutting her wrist with a razor. In the past she had tried Wellbutrin but  limited response. She has intensive outpatient program in [place]. She denies any inpatient psychiatric treatment .   Past Medical History:  Past Medical History:  Diagnosis Date  . ADHD (attention deficit hyperactivity disorder)   . Anxiety   . Asthma    daily and prn inhalers  . Bipolar affective disorder ([place])   . Dental crowns present   . Depression   . Deviated nasal septum 05/2012  . Eczema    right leg  . Esophageal reflux   . Fibromyalgia   . Hypertension   . IBS (irritable bowel syndrome)   . Migraine headache   . Nasal turbinate hypertrophy 05/2012   bilateral  . OCD (obsessive compulsive disorder)   . TMJ syndrome     Past Surgical History:  Procedure Laterality Date  . ASD AND VSD REPAIR  1989  . CARDIAC CATHETERIZATION  06/18/2006  . CESAREAN SECTION  04/12/2009  . COARCTATION OF AORTA REPAIR  1989  . COLONOSCOPY N/A 05/20/2013   Procedure: COLONOSCOPY;  Surgeon: Danie Binder, MD;  Location: [facility];  Service: Endoscopy;  Laterality: N/A;  115-moved to [place] notified pt  . ESOPHAGOGASTRODUODENOSCOPY  04/04/2008     Normal esophagus/Mild patchy erythema in the antrum/ Normal duodenal bulb, normal small bowel biopsy  . FLEXIBLE SIGMOIDOSCOPY  04/04/2008     Small internal hemorrhoids (poor bowel prep)  . HEMORRHOID BANDING N/A 05/20/2013   Procedure: HEMORRHOID BANDING;  Surgeon: Danie Binder, MD;  Location: [facility];  Service: Endoscopy;  Laterality: N/A;  . NASAL SEPTOPLASTY W/ TURBINOPLASTY Bilateral 05/31/2012   Procedure: NASAL SEPTOPLASTY WITH TURBINATE REDUCTION;  Surgeon: Ascencion Dike, MD;  Location: [facility];  Service: ENT;  Laterality: Bilateral;    Family Psychiatric History: Reviewed.  Family History:  Family History  Problem Relation Age of Onset  . Hyperlipidemia Mother   . Hypertension Mother   . Depression Mother   . Physical abuse Mother   . Sexual abuse Mother     possibly  . Sleep apnea Mother    . Emphysema Paternal Grandmother   . Heart disease Paternal Grandmother   . Bipolar disorder Paternal Grandmother   . Dementia Paternal Grandmother   . Heart disease Maternal Grandmother   . Dementia Maternal Grandmother   . Stroke Maternal Grandfather   . Prostate cancer Maternal Grandfather   . Personality disorder Father   . Bipolar disorder Father   . Alcohol abuse Father   . ADD / ADHD Sister   . Anxiety disorder Brother   . ADD / ADHD Brother   . ADD / ADHD Brother   . Seizures Daughter   . Drug abuse Neg Hx   . OCD Neg Hx   . Paranoid behavior Neg Hx   . Schizophrenia Neg Hx     Social History:  Social History   Social History  . Marital status: Single    Spouse name: N/A  . Number of children: 1  . Years of education: GED   Occupational History  . [employer]    Social History Main Topics  . Smoking status: Current Every Day Smoker    Packs/day: 0.50    Years: 10.00    Types: Cigarettes    Start date: 02/27/2002  . Smokeless tobacco: Never Used     Comment: quit smoking in 2010 during pregnacy then started back Feb. 2011   . 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 Drug use: No     Comment: marijuana and pain pills; 12/08/07-smoked x 1 month ago- this was the first time in 2.years.  Morphine Sulfate and oxycontin - none since age 44  . Sexual activity: Yes    Birth control/ protection: Injection, Spermicide   Other Topics Concern  . None   Social History Narrative   Lives with 3 roommates   LIKES TO WATCH DR. PIMPLE POPPER. AT AGE 74 LIKED TO PICK HER NOSE AND EAT THE DRIED MUCOUS.    Caffeine: coffee, soda, occasional Red Bull       Allergies:  Allergies  Allergen Reactions  . Sulfonamide Derivatives Swelling    SWELLING OF EYES WITH OPHTHALMIC SULFA  . Coconut Flavor Rash    Metabolic Disorder Labs: Lab Results  Component Value Date   HGBA1C 5.0 07/17/2014   MPG 97 08/28/2011   No results found for:  PROLACTIN Lab Results  Component Value Date   CHOL  02/06/2007    139        ATP III CLASSIFICATION:  <200     mg/dL   Desirable  200-239  mg/dL   Borderline High  >=240    mg/dL   High   TRIG 72 02/06/2007   HDL 33 (L) 02/06/2007   CHOLHDL 4.2 02/06/2007   VLDL 14 02/06/2007   LDLCALC  02/06/2007    92  Total Cholesterol/HDL:CHD Risk Coronary Heart Disease Risk Table                     Men   Women  1/2 Average Risk   3.4   3.3     Current Medications: Current Outpatient Prescriptions  Medication Sig Dispense Refill  . albuterol (PROVENTIL) (2.5 MG/3ML) 0.083% nebulizer solution Take 2.5 mg by nebulization every 6 (six) hours as needed for wheezing or shortness of breath.     . amphetamine-dextroamphetamine (ADDERALL XR) 20 MG 24 hr capsule Take 1 capsule (20 mg total) by mouth daily. 30 capsule 0  . FLUoxetine (PROZAC) 40 MG capsule Take 1 capsule (40 mg total) by mouth daily. 30 capsule 2  . fluticasone (CUTIVATE) 0.05 % cream Apply topically 2 (two) times daily. 30 g 0  . fluticasone (FLONASE) 50 MCG/ACT nasal spray USE 2 SPRAYS IN EACH NOSTRIL EVERY DAY 16 g 5  . gabapentin (NEURONTIN) 300 MG capsule TAKE 2 CAPSULES BY MOUTH EVERY MORNING, 1 CAPSULE EVERY AFTERNOON, AND 2 CAPSULES EVERY EVENING 150 capsule 5  . HYDROcodone-acetaminophen (NORCO) 7.5-325 MG tablet Take 1 tablet by mouth daily as needed for moderate pain or severe pain. 20 tablet 0  . lamoTRIgine (LAMICTAL) 150 MG tablet Take 1 tablet (150 mg total) by mouth 2 (two) times daily. 60 tablet 2  . LORazepam (ATIVAN) 1 MG tablet Take 1 tablet 30-60 minutes before MRI may repeat in 30-60 minutes if needed 3 tablet 0  . medroxyPROGESTERone (DEPO-PROVERA) 400 MG/ML SUSP injection Inject into the muscle once. Every 3 months    . prochlorperazine (COMPAZINE) 10 MG tablet Take 1 tablet (10 mg total) by mouth every 8 (eight) hours as needed for nausea or vomiting (migaine headache). 10 tablet 0  . traZODone (DESYREL)  50 MG tablet Take 1 tablet (50 mg total) by mouth at bedtime as needed. for sleep 30 tablet 0   No current facility-administered medications for this visit.     Neurologic: Headache: No Seizure: No Paresthesias: No  Musculoskeletal: Strength & Muscle Tone: within normal limits Gait & Station: normal Patient leans: N/A  Psychiatric Specialty Exam: ROS  Blood pressure 118/72, pulse 76, height 5\' 5"  (1.651 m), weight 202 lb 9.6 oz (91.9 kg).Body mass index is 33.71 kg/m.  General Appearance: Casual  Eye Contact:  Good  Speech:  Clear and Coherent  Volume:  Normal  Mood:  Euthymic  Affect:  Congruent  Thought Process:  Goal Directed  Orientation:  Full (Time, Place, and Person)  Thought Content: WDL and Logical   Suicidal Thoughts:  No  Homicidal Thoughts:  No  Memory:  Immediate;   Good Recent;   Good Remote;   Good  Judgement:  Good  Insight:  Good  Psychomotor Activity:  Normal  Concentration:  Concentration: Good and Attention Span: Good  Recall:  Good  Fund of Knowledge: Good  Language: Good  Akathisia:  No  Handed:  Right  AIMS (if indicated):  0  Assets:  Communication Skills Desire for Improvement Housing Physical Health Resilience Transportation  ADL's:  Intact  Cognition: WNL  Sleep:  Good     Assessment: Attention deficit disorder inattentive type.  Bipolar disorder type I.  Plan: Patient is a stable on her current psychiatric medication.  Recently she see neurologist as scheduled to have an MRI and EEG.  She does not want to change her medication because working very well.  Continue Prozac 40 mg daily, Lamictal 150 mg  twice a day, Adderall 20 mg daily and trazodone 50 mg as needed for insomnia.  Discussed medication side effects and benefits.  Patient does not ask early refills for her stimulants.  Recommended to call us back if she has any question, concern or if she feels worsening of the symptom.  Follow-up in 3 months  Trenise Turay T.,  MD 08/05/2016, 4:01 PM

## 2016-08-29 ENCOUNTER — Ambulatory Visit: Payer: Medicaid Other

## 2016-09-01 ENCOUNTER — Ambulatory Visit (INDEPENDENT_AMBULATORY_CARE_PROVIDER_SITE_OTHER): Payer: Self-pay | Admitting: Nurse Practitioner

## 2016-09-01 ENCOUNTER — Other Ambulatory Visit (HOSPITAL_COMMUNITY): Payer: Self-pay | Admitting: Psychiatry

## 2016-09-01 ENCOUNTER — Encounter: Payer: Self-pay | Admitting: Nurse Practitioner

## 2016-09-01 ENCOUNTER — Other Ambulatory Visit: Payer: Self-pay | Admitting: Family Medicine

## 2016-09-01 VITALS — BP 132/74 | Ht 65.0 in | Wt 196.0 lb

## 2016-09-01 DIAGNOSIS — F3131 Bipolar disorder, current episode depressed, mild: Secondary | ICD-10-CM

## 2016-09-01 DIAGNOSIS — M797 Fibromyalgia: Secondary | ICD-10-CM

## 2016-09-01 DIAGNOSIS — Z113 Encounter for screening for infections with a predominantly sexual mode of transmission: Secondary | ICD-10-CM

## 2016-09-01 DIAGNOSIS — Z3042 Encounter for surveillance of injectable contraceptive: Secondary | ICD-10-CM

## 2016-09-01 MED ORDER — HYDROCODONE-ACETAMINOPHEN 7.5-325 MG PO TABS: 1.0000 | ORAL_TABLET | Freq: Every day | ORAL | 0 refills | Status: DC | PRN

## 2016-09-02 ENCOUNTER — Encounter: Payer: Self-pay | Admitting: Nurse Practitioner

## 2016-09-02 LAB — CHLAMYDIA/GONOCOCCUS/TRICHOMONAS, NAA
Chlamydia by NAA: NEGATIVE
Gonococcus by NAA: NEGATIVE
Trich vag by NAA: NEGATIVE

## 2016-09-02 NOTE — Progress Notes (Signed)
Subjective:  Presents for recheck on her fibromyalgia. Continues to follow-up with psychiatry. Has been under increased stress lately which worsens her fibromyalgia symptoms. Takes a very rare hydrocodone only with severe symptoms, using very sparingly. Takes trazodone as needed for sleep which helps. Continues to use Depo-Provera for birth control, refills sent in today. No vaginal bleeding. Does have a new sexual partner since April. No fever or pelvic pain or discharge.  Objective:   BP 132/74   Ht 5\' 5"  (1.651 m)   Wt 196 lb (88.9 kg)   BMI 32.62 kg/m  NAD. Alert, oriented. Lungs clear. Heart regular rate rhythm.  Assessment:   Problem List Items Addressed This Visit      Other   Fibromyalgia - Primary (Chronic)    Other Visit Diagnoses    Encounter for surveillance of injectable contraceptive       Screen for STD (sexually transmitted disease)       Relevant Orders   Chlamydia/Gonococcus/Trichomonas, NAA       Plan:   Meds ordered this encounter  Medications  . HYDROcodone-acetaminophen (NORCO) 7.5-325 MG tablet    Sig: Take 1 tablet by mouth daily as needed for moderate pain or severe pain.    Dispense:  20 tablet    Refill:  0    Must last 90 days    Order Specific Question:   Supervising Provider    Answer:   Mikey Kirschner [[id number]]   Again patient to use pain medication very sparingly. Routine STD screening since new partner. Discussed importance of stress reduction. Reminded about preventive health physical. Return in about 3 months (around 12/02/2016) for Depo Provera.

## 2016-09-04 ENCOUNTER — Other Ambulatory Visit (HOSPITAL_COMMUNITY): Payer: Self-pay | Admitting: Psychiatry

## 2016-09-09 ENCOUNTER — Ambulatory Visit (INDEPENDENT_AMBULATORY_CARE_PROVIDER_SITE_OTHER): Payer: Medicaid Other

## 2016-09-09 DIAGNOSIS — Z3042 Encounter for surveillance of injectable contraceptive: Secondary | ICD-10-CM

## 2016-09-09 MED ORDER — MEDROXYPROGESTERONE ACETATE 150 MG/ML IM SUSP
150.0000 mg | Freq: Once | INTRAMUSCULAR | Status: AC
  Administered 2016-09-09: 150 mg via INTRAMUSCULAR

## 2016-10-06 ENCOUNTER — Encounter: Payer: Self-pay | Admitting: Nurse Practitioner

## 2016-10-09 ENCOUNTER — Other Ambulatory Visit: Payer: Self-pay | Admitting: Nurse Practitioner

## 2016-10-09 MED ORDER — DOXYCYCLINE HYCLATE 100 MG PO TABS: 100.0000 mg | ORAL_TABLET | Freq: Two times a day (BID) | ORAL | 2 refills | Status: DC

## 2016-10-30 ENCOUNTER — Other Ambulatory Visit (HOSPITAL_COMMUNITY): Payer: Self-pay | Admitting: Psychiatry

## 2016-10-30 DIAGNOSIS — F3131 Bipolar disorder, current episode depressed, mild: Secondary | ICD-10-CM

## 2016-10-30 DIAGNOSIS — F900 Attention-deficit hyperactivity disorder, predominantly inattentive type: Secondary | ICD-10-CM

## 2016-11-06 ENCOUNTER — Ambulatory Visit (HOSPITAL_COMMUNITY): Payer: Self-pay | Admitting: Psychiatry

## 2016-11-06 ENCOUNTER — Other Ambulatory Visit (HOSPITAL_COMMUNITY): Payer: Self-pay

## 2016-11-06 DIAGNOSIS — F900 Attention-deficit hyperactivity disorder, predominantly inattentive type: Secondary | ICD-10-CM

## 2016-11-06 MED ORDER — AMPHETAMINE-DEXTROAMPHET ER 20 MG PO CP24: 20.0000 mg | ORAL_CAPSULE | Freq: Every day | ORAL | 0 refills | Status: DC

## 2016-11-06 NOTE — Progress Notes (Signed)
Patient was supposed to be seen today, but was rescheduled by the provider. She ran out of her Adderall today. Since Dr. Adele Schilder left for the afternoon, I asked Dr. Daron Offer. Prescription was approved for one month and given to the patient.

## 2016-11-14 ENCOUNTER — Ambulatory Visit (INDEPENDENT_AMBULATORY_CARE_PROVIDER_SITE_OTHER): Payer: Self-pay | Admitting: Psychiatry

## 2016-11-14 ENCOUNTER — Encounter (HOSPITAL_COMMUNITY): Payer: Self-pay | Admitting: Psychiatry

## 2016-11-14 DIAGNOSIS — F3131 Bipolar disorder, current episode depressed, mild: Secondary | ICD-10-CM

## 2016-11-14 DIAGNOSIS — Z810 Family history of intellectual disabilities: Secondary | ICD-10-CM

## 2016-11-14 DIAGNOSIS — Z811 Family history of alcohol abuse and dependence: Secondary | ICD-10-CM

## 2016-11-14 DIAGNOSIS — F17210 Nicotine dependence, cigarettes, uncomplicated: Secondary | ICD-10-CM

## 2016-11-14 DIAGNOSIS — Z818 Family history of other mental and behavioral disorders: Secondary | ICD-10-CM

## 2016-11-14 DIAGNOSIS — F900 Attention-deficit hyperactivity disorder, predominantly inattentive type: Secondary | ICD-10-CM

## 2016-11-14 MED ORDER — AMPHETAMINE-DEXTROAMPHET ER 20 MG PO CP24: 20.0000 mg | ORAL_CAPSULE | Freq: Every day | ORAL | 0 refills | Status: DC

## 2016-11-14 MED ORDER — FLUOXETINE HCL 40 MG PO CAPS: 40.0000 mg | ORAL_CAPSULE | Freq: Every day | ORAL | 2 refills | Status: DC

## 2016-11-14 MED ORDER — TRAZODONE HCL 50 MG PO TABS: 50.0000 mg | ORAL_TABLET | Freq: Every evening | ORAL | 1 refills | Status: DC | PRN

## 2016-11-14 MED ORDER — LAMOTRIGINE 150 MG PO TABS
150.0000 mg | ORAL_TABLET | Freq: Two times a day (BID) | ORAL | 2 refills | Status: DC
Start: 2016-11-14 — End: 2017-01-30
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
## 2016-11-19 ENCOUNTER — Encounter: Payer: Self-pay | Admitting: Family Medicine

## 2016-11-19 ENCOUNTER — Other Ambulatory Visit (HOSPITAL_COMMUNITY): Payer: Self-pay | Admitting: Psychiatry

## 2016-11-25 ENCOUNTER — Telehealth: Payer: Self-pay | Admitting: Family Medicine

## 2016-11-25 ENCOUNTER — Ambulatory Visit: Payer: Medicaid Other

## 2016-11-25 NOTE — Telephone Encounter (Signed)
Patient notified Per Carolyn's recommendations  , need to make last til next visit. Patient verbalized understanding. Patient has follow up office visit 12/05/16 with Hoyle Sauer

## 2016-11-25 NOTE — Telephone Encounter (Signed)
Per carolyn's rec , need to make last til next visit

## 2016-11-25 NOTE — Telephone Encounter (Signed)
Last seen 08/2016- given one script at that time that must last 90 days for fibromyalgia

## 2016-11-25 NOTE — Telephone Encounter (Signed)
Pt is requesting a refill on HYDROcodone-acetaminophen (NORCO) 7.5-325 MG tablet 

## 2016-12-03 ENCOUNTER — Other Ambulatory Visit (HOSPITAL_COMMUNITY): Payer: Self-pay | Admitting: Psychiatry

## 2016-12-03 DIAGNOSIS — F3131 Bipolar disorder, current episode depressed, mild: Secondary | ICD-10-CM

## 2016-12-05 ENCOUNTER — Ambulatory Visit (INDEPENDENT_AMBULATORY_CARE_PROVIDER_SITE_OTHER): Payer: Medicaid Other | Admitting: Nurse Practitioner

## 2016-12-05 ENCOUNTER — Encounter: Payer: Self-pay | Admitting: Nurse Practitioner

## 2016-12-05 VITALS — BP 120/80 | Ht 65.0 in | Wt 200.4 lb

## 2016-12-05 DIAGNOSIS — M797 Fibromyalgia: Secondary | ICD-10-CM

## 2016-12-05 DIAGNOSIS — Z3042 Encounter for surveillance of injectable contraceptive: Secondary | ICD-10-CM

## 2016-12-05 MED ORDER — MEDROXYPROGESTERONE ACETATE 150 MG/ML IM SUSP
150.0000 mg | Freq: Once | INTRAMUSCULAR | Status: AC
  Administered 2016-12-05: 150 mg via INTRAMUSCULAR

## 2016-12-05 MED ORDER — GABAPENTIN 300 MG PO CAPS: ORAL_CAPSULE | ORAL | 5 refills | Status: DC

## 2016-12-05 MED ORDER — HYDROCODONE-ACETAMINOPHEN 7.5-325 MG PO TABS: 1.0000 | ORAL_TABLET | Freq: Every day | ORAL | 0 refills | Status: DC | PRN

## 2016-12-05 NOTE — Progress Notes (Signed)
Subjective:  Presents for recheck on her fibromyalgia. Overall gabapentin doing well. Takes a very rare hydrocodone only for severe pain. Tends to be worse in the wintertime. Has started another job back at her previous employer, [employer]. Continues follow-up with her psychiatrist.  Objective:   BP 120/80   Ht 5\' 5"  (1.651 m)   Wt 200 lb 6 oz (90.9 kg)   BMI 33.34 kg/m  NAD. Alert, oriented. Lungs clear. Heart regular rate rhythm.  Assessment:   Problem List Items Addressed This Visit      Other   Fibromyalgia - Primary (Chronic)    Other Visit Diagnoses    Encounter for surveillance of injectable contraceptive       Relevant Medications   medroxyPROGESTERone (DEPO-PROVERA) injection 150 mg (Completed)       Plan:   Meds ordered this encounter  Medications  . medroxyPROGESTERone (DEPO-PROVERA) injection 150 mg  . HYDROcodone-acetaminophen (NORCO) 7.5-325 MG tablet    Sig: Take 1 tablet by mouth daily as needed for moderate pain or severe pain.    Dispense:  20 tablet    Refill:  0    Must last 90 days    Order Specific Question:   Supervising Provider    Answer:   Mikey Kirschner [[id number]]  . gabapentin (NEURONTIN) 300 MG capsule    Sig: TAKE 2 CAPSULES BY MOUTH EVERY MORNING, 1 CAPSULE EVERY AFTERNOON, AND 2 CAPSULES EVERY EVENING    Dispense:  150 capsule    Refill:  5    This prescription was filled on 01/30/2016. Any refills authorized will be placed on file.    Order Specific Question:   Supervising Provider    Answer:   Maggie Font   Continue current medications as directed. Discussed importance of stress reduction and control of anxiety which has improved lately. Use hydrocodone sparingly. Return in about 3 months (around 03/06/2017) for recheck.

## 2016-12-10 ENCOUNTER — Ambulatory Visit: Payer: Medicaid Other | Admitting: Nurse Practitioner

## 2017-01-06 ENCOUNTER — Telehealth: Payer: Self-pay | Admitting: Internal Medicine

## 2017-01-06 DIAGNOSIS — Q249 Congenital malformation of heart, unspecified: Secondary | ICD-10-CM

## 2017-01-06 NOTE — Telephone Encounter (Signed)
Patient had echo and then MRA one year ago.  Will route to Dr. Harrington Challenger for recommendations for repeat echo/mra this year prior to appointment which is in January.  She is not having any new issues currently. She has occasional  palpitations, SOB, fatigue.  These are not new.

## 2017-01-06 NOTE — Telephone Encounter (Signed)
New Message  Pt call requesting to speak with RN about getting and order in the system for an echo. Please call back to discuss

## 2017-01-11 NOTE — Telephone Encounter (Signed)
F/U echo in the spring  I can see her after/same day

## 2017-01-14 NOTE — Telephone Encounter (Signed)
Left voice mail (self-identified) message that Dr. Harrington Challenger will see her in the Spring.  Advised to call back to schedule echo and follow up appointment same day around the end of March.

## 2017-01-25 ENCOUNTER — Other Ambulatory Visit: Payer: Self-pay

## 2017-01-25 ENCOUNTER — Encounter (HOSPITAL_COMMUNITY): Payer: Self-pay | Admitting: Emergency Medicine

## 2017-01-25 ENCOUNTER — Emergency Department (HOSPITAL_COMMUNITY)
Admission: EM | Admit: 2017-01-25 | Discharge: 2017-01-26 | Disposition: A | Discharge status: Other Acute Inpatient Hospital | Payer: Self-pay | Attending: Emergency Medicine | Admitting: Emergency Medicine

## 2017-01-25 DIAGNOSIS — Z046 Encounter for general psychiatric examination, requested by authority: Secondary | ICD-10-CM | POA: Insufficient documentation

## 2017-01-25 DIAGNOSIS — Q249 Congenital malformation of heart, unspecified: Secondary | ICD-10-CM | POA: Insufficient documentation

## 2017-01-25 DIAGNOSIS — Y9389 Activity, other specified: Secondary | ICD-10-CM | POA: Insufficient documentation

## 2017-01-25 DIAGNOSIS — J45909 Unspecified asthma, uncomplicated: Secondary | ICD-10-CM | POA: Insufficient documentation

## 2017-01-25 DIAGNOSIS — F17210 Nicotine dependence, cigarettes, uncomplicated: Secondary | ICD-10-CM | POA: Insufficient documentation

## 2017-01-25 DIAGNOSIS — T1491XA Suicide attempt, initial encounter: Secondary | ICD-10-CM | POA: Insufficient documentation

## 2017-01-25 DIAGNOSIS — Z79899 Other long term (current) drug therapy: Secondary | ICD-10-CM | POA: Insufficient documentation

## 2017-01-25 DIAGNOSIS — I10 Essential (primary) hypertension: Secondary | ICD-10-CM | POA: Insufficient documentation

## 2017-01-25 DIAGNOSIS — X838XXA Intentional self-harm by other specified means, initial encounter: Secondary | ICD-10-CM | POA: Insufficient documentation

## 2017-01-25 DIAGNOSIS — Y929 Unspecified place or not applicable: Secondary | ICD-10-CM | POA: Insufficient documentation

## 2017-01-25 DIAGNOSIS — Y999 Unspecified external cause status: Secondary | ICD-10-CM | POA: Insufficient documentation

## 2017-01-25 DIAGNOSIS — T50902A Poisoning by unspecified drugs, medicaments and biological substances, intentional self-harm, initial encounter: Secondary | ICD-10-CM | POA: Insufficient documentation

## 2017-01-25 DIAGNOSIS — F3181 Bipolar II disorder: Secondary | ICD-10-CM | POA: Diagnosis present

## 2017-01-25 DIAGNOSIS — X781XXA Intentional self-harm by knife, initial encounter: Secondary | ICD-10-CM | POA: Insufficient documentation

## 2017-01-25 LAB — COMPREHENSIVE METABOLIC PANEL
ALT: 23 U/L (ref 14–54)
AST: 22 U/L (ref 15–41)
Albumin: 4.2 g/dL (ref 3.5–5.0)
Alkaline Phosphatase: 85 U/L (ref 38–126)
Anion gap: 10 (ref 5–15)
BUN: 11 mg/dL (ref 6–20)
CO2: 27 mmol/L (ref 22–32)
Calcium: 9.4 mg/dL (ref 8.9–10.3)
Chloride: 103 mmol/L (ref 101–111)
Creatinine, Ser: 0.81 mg/dL (ref 0.44–1.00)
GFR calc Af Amer: >60 mL/min (ref >60)
GFR calc non Af Amer: >60 mL/min (ref >60)
Glucose, Bld: 96 mg/dL (ref 65–99)
Potassium: 4 mmol/L (ref 3.5–5.1)
Sodium: 140 mmol/L (ref 135–145)
Total Bilirubin: 0.4 mg/dL (ref 0.3–1.2)
Total Protein: 7.3 g/dL (ref 6.5–8.1)

## 2017-01-25 LAB — RAPID URINE DRUG SCREEN, HOSP PERFORMED
Amphetamines: NOT DETECTED
Barbiturates: NOT DETECTED
Benzodiazepines: NOT DETECTED
Cocaine: NOT DETECTED
Opiates: NOT DETECTED
Tetrahydrocannabinol: NOT DETECTED

## 2017-01-25 LAB — CBC
HCT: 42.4 % (ref 36.0–46.0)
Hemoglobin: 14.7 g/dL (ref 12.0–15.0)
MCH: 31.1 pg (ref 26.0–34.0)
MCHC: 34.7 g/dL (ref 30.0–36.0)
MCV: 89.6 fL (ref 78.0–100.0)
Platelets: 291 10*3/uL (ref 150–400)
RBC: 4.73 MIL/uL (ref 3.87–5.11)
RDW: 12.9 % (ref 11.5–15.5)
WBC: 7.1 10*3/uL (ref 4.0–10.5)

## 2017-01-25 LAB — ACETAMINOPHEN LEVEL: Acetaminophen (Tylenol), Serum: <10 ug/mL — ABNORMAL LOW (ref 10–30)

## 2017-01-25 LAB — POC URINE PREG, ED: PREG TEST UR: NEGATIVE

## 2017-01-25 LAB — CBG MONITORING, ED: Glucose-Capillary: 106 mg/dL — ABNORMAL HIGH (ref 65–99)

## 2017-01-25 LAB — SALICYLATE LEVEL

## 2017-01-25 LAB — ETHANOL: Alcohol, Ethyl (B): <10 mg/dL (ref <10)

## 2017-01-25 NOTE — BH Assessment (Signed)
[facility] Assessment Progress Note  Per Lindon Romp, NP pt meets criteria for inpt hospitalization. TTS to seek placement due to no available beds at [facility] per [facility]. EDP Kinnie Feil, PA-C notified of disposition and in agreement.   Lind Covert, MSW, LCSW Therapeutic Triage Specialist  [phone]

## 2017-01-25 NOTE — ED Notes (Signed)
Poison control notified of pt taking unknown amount of Gabapentin and Lamictal. Advised to have pt cardiac monitoring for 4 hours and assess for HTN, tachycardia, QT prolongation, agitation, also do labs for clearance and notify Poison Control if any changes occur. If needed give IV fluids and norepinephrine. Provider to be notified of recommendations
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
## 2017-01-25 NOTE — ED Provider Notes (Signed)
Medical screening examination/treatment/procedure(s) were conducted as a shared visit with non-physician practitioner(s) and myself.  I personally evaluated the patient during the encounter.   EKG Interpretation  Date/Time:  Sunday January 25 2017 20:47:14 EDT Ventricular Rate:  79 PR Interval:  176 QRS Duration: 88 QT Interval:  386 QTC Calculation: 442 R Axis:   93 Text Interpretation:  Normal sinus rhythm Rightward axis Borderline ECG No significant change since last tracing Confirmed by Fredia Sorrow [id number]) on 01/25/2017 9:03:17 PM       Results for orders placed or performed during the hospital encounter of 01/25/17  Comprehensive metabolic panel  Result Value Ref Range   Sodium 140 135 - 145 mmol/L   Potassium 4.0 3.5 - 5.1 mmol/L   Chloride 103 101 - 111 mmol/L   CO2 27 22 - 32 mmol/L   Glucose, Bld 96 65 - 99 mg/dL   BUN 11 6 - 20 mg/dL   Creatinine, Ser 0.81 0.44 - 1.00 mg/dL   Calcium 9.4 8.9 - 10.3 mg/dL   Total Protein 7.3 6.5 - 8.1 g/dL   Albumin 4.2 3.5 - 5.0 g/dL   AST 22 15 - 41 U/L   ALT 23 14 - 54 U/L   Alkaline Phosphatase 85 38 - 126 U/L   Total Bilirubin 0.4 0.3 - 1.2 mg/dL   GFR calc non Af Amer >60 >60 mL/min   GFR calc Af Amer >60 >60 mL/min   Anion gap 10 5 - 15  Ethanol  Result Value Ref Range   Alcohol, Ethyl (B) <37 <90 mg/dL  Salicylate level  Result Value Ref Range   Salicylate Lvl <2.4 2.8 - 30.0 mg/dL  Acetaminophen level  Result Value Ref Range   Acetaminophen (Tylenol), Serum <10 (L) 10 - 30 ug/mL  cbc  Result Value Ref Range   WBC 7.1 4.0 - 10.5 K/uL   RBC 4.73 3.87 - 5.11 MIL/uL   Hemoglobin 14.7 12.0 - 15.0 g/dL   HCT 42.4 36.0 - 46.0 %   MCV 89.6 78.0 - 100.0 fL   MCH 31.1 26.0 - 34.0 pg   MCHC 34.7 30.0 - 36.0 g/dL   RDW 12.9 11.5 - 15.5 %   Platelets 291 150 - 400 K/uL  Rapid urine drug screen (hospital performed)  Result Value Ref Range   Opiates NONE DETECTED NONE DETECTED   Cocaine NONE DETECTED NONE DETECTED   Benzodiazepines NONE DETECTED NONE DETECTED   Amphetamines NONE DETECTED NONE DETECTED   Tetrahydrocannabinol NONE DETECTED NONE DETECTED   Barbiturates NONE DETECTED NONE DETECTED  CBG monitoring, ED  Result Value Ref Range   Glucose-Capillary 106 (H) 65 - 99 mg/dL  POC urine preg, ED  Result Value Ref Range   Preg Test, Ur NEGATIVE NEGATIVE     Patient seen by me along with the physician assistant.  Patient brought in by EMS for evaluation of suicide attempt.  Patient states she took extra your gabapentin and Lamictal at around 7 this evening.  States she took about 6 tablets each.  She wanted to kill herself.  She also tried to cut her left arm with a knife.  We could not find one sharp enough.  Patient with multiple superficial cuts to the left arm.  Patient here is nontoxic no acute distress very alert.  EKG did show prolonged QT.  Otherwise no QRS widening.  Patient's initial labs to include pregnancy test which was negative urine drug screen which was negative and liver function test was  negative and Tylenol and alcohol was not elevated  Patient was discussed with poison control they recommended observation for 4 hours.  Patient looking very stable at this point in time we will go ahead and place consult in for TTS.  Patient most likely will become medically cleared.  And can be evaluated formally by psychiatry.   Fredia Sorrow, MD 01/25/17 2226

## 2017-01-25 NOTE — ED Triage Notes (Signed)
Pt presents by EMS for evaluation of suicidal ideation. Pt reports to taking Gabapentin and Lamictal over the last several days up to 6 tablets at a time. Pt keeps repeating that she wants to finish herself. Pt alert and oriented x 4 at this time.

## 2017-01-25 NOTE — ED Notes (Signed)
Pt has hair wrap and earrings, bracelet these things wasn't taken from pt and rhe nurse was informed

## 2017-01-25 NOTE — ED Provider Notes (Signed)
[place] DEPT Provider Note   CSN: [id number] Arrival date & time: 01/25/17  2006     History   Chief Complaint Chief Complaint  Patient presents with  . Suicidal  . Drug Overdose    HPI Kristin Stephens is a 31 y.o. female with history of bipolar 2 disorder and depression with anxiety presents to ED for intentional drug overdose. Patient has been taking 6-8 gabapentin every couple of hours with extra doses of Lamictal for the last 2 days. She states she does not want to live anymore. She woke up after ingesting medications and was really upset that she was not dead and tried to cut her wrists with multiple knives in the kitchen. She called EMS for evaluation. Reports remote suicidal attempt over 10 years ago. Actively having suicidal thoughts. Denies homicidal ideation, visual or auditory hallucinations.Denies illicit drug use or EtOH use. No fevers, chills, chest pain, shortness of breath, abdominal pain, diarrhea, tremors, dizziness, visual changes. She has history of palpitations  HPI  Past Medical History:  Diagnosis Date  . ADHD (attention deficit hyperactivity disorder)   . Anxiety   . Asthma    daily and prn inhalers  . Bipolar affective disorder ([place])   . Dental crowns present   . Depression   . Deviated nasal septum 05/2012  . Eczema    right leg  . Esophageal reflux   . Fibromyalgia   . Hypertension   . IBS (irritable bowel syndrome)   . Migraine headache   . Nasal turbinate hypertrophy 05/2012   bilateral  . OCD (obsessive compulsive disorder)   . TMJ syndrome     Patient Active Problem List   Diagnosis Date Noted  . Bipolar 2 disorder ([place]) 05/16/2014  . Internal hemorrhoids with complication 60/63/0160  . Vasomotor rhinitis 04/15/2013  . Gastritis without bleeding 03/10/2013  . Anal fissure 03/10/2013  . Obsessive compulsive disorder 03/07/2013  . Fibromyalgia 02/06/2013  . Detachment of glenoid labrum 01/13/2013  .  Difficulty in walking(719.7) 02/18/2012  . Seasonal affective disorder ([place]) 11/25/2011  . Asthma, intrinsic 08/28/2010  . ATTENTION DEFICIT DISORDER, ADULT 01/19/2008  . RHINITIS MEDICAMENTOSA 12/08/2007  . ALLERGIC RHINITIS 12/08/2007  . IBS 12/08/2007  . ECZEMA 12/08/2007  . 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 Migraine headache 04/24/2007  . Essential hypertension 04/24/2007  . ASVD 04/24/2007  . GERD 04/24/2007  . VSD 04/24/2007  . 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 ASD AND VSD REPAIR  1989  . CARDIAC CATHETERIZATION  06/18/2006  . CESAREAN SECTION  04/12/2009  . COARCTATION OF AORTA REPAIR  1989  . COLONOSCOPY N/A 05/20/2013   Procedure: COLONOSCOPY;  Surgeon: Danie Binder, MD;  [facility];  Service: Endoscopy;  Laterality: N/A;  115-moved to [place] notified pt  . ESOPHAGOGASTRODUODENOSCOPY  04/04/2008     Normal esophagus/Mild patchy erythema in the antrum/ Normal duodenal bulb, normal small bowel biopsy  . FLEXIBLE SIGMOIDOSCOPY  04/04/2008     Small internal hemorrhoids (poor bowel prep)  . HEMORRHOID BANDING N/A 05/20/2013   Procedure: HEMORRHOID BANDING;  Surgeon: Danie Binder, MD;  Location[facility];  Service: Endoscopy;  Laterality: N/A;  . NASAL SEPTOPLASTY W/ TURBINOPLASTY Bilateral 05/31/2012   Procedure: NASAL SEPTOPLASTY WITH TURBINATE REDUCTION;  Surgeon: Ascencion Dike, MD;  Location: [facility];  Service: ENT;  Laterality: Bilateral;    OB History  Gravida Para Term Preterm AB Living   1 1 1     1    SAB TAB Ectopic Multiple Live Births                   Home Medications    Prior to Admission medications   Medication Sig Start Date End Date Taking? Authorizing Provider  albuterol (PROVENTIL) (2.5 MG/3ML) 0.083% nebulizer solution Take 2.5 mg by nebulization every 6 (six) hours as needed for wheezing or shortness of breath.     [provider]    amphetamine-dextroamphetamine (ADDERALL XR) 20 MG 24 hr capsule Take 1 capsule (20 mg total) by mouth daily. 11/14/16 11/14/17  Arfeen, Arlyce Harman, MD  FLUoxetine (PROZAC) 40 MG capsule Take 1 capsule (40 mg total) by mouth daily. 11/14/16   Arfeen, Arlyce Harman, MD  fluticasone (CUTIVATE) 0.05 % cream Apply topically 2 (two) times daily. 03/17/16   Billy Fischer, MD  fluticasone (FLONASE) 50 MCG/ACT nasal spray USE 2 SPRAYS IN EACH NOSTRIL EVERY DAY 06/25/16   Mikey Kirschner, MD  gabapentin (NEURONTIN) 300 MG capsule TAKE 2 CAPSULES BY MOUTH EVERY MORNING, 1 CAPSULE EVERY AFTERNOON, AND 2 CAPSULES EVERY EVENING 12/05/16   Nilda Simmer, NP  HYDROcodone-acetaminophen (NORCO) 7.5-325 MG tablet Take 1 tablet by mouth daily as needed for moderate pain or severe pain. 12/05/16   Nilda Simmer, NP  lamoTRIgine (LAMICTAL) 150 MG tablet Take 1 tablet (150 mg total) by mouth 2 (two) times daily. 11/14/16   Arfeen, Arlyce Harman, MD  LORazepam (ATIVAN) 1 MG tablet Take 1 tablet 30-60 minutes before MRI may repeat in 30-60 minutes if needed 07/31/16   Melvenia Beam, MD  medroxyPROGESTERone (DEPO-PROVERA) 400 MG/ML SUSP injection Inject into the muscle once. Every 3 months    [provider]  MedroxyPROGESTERone Acetate 150 MG/ML SUSY INJECT 150 MG (1 SYRINGE) INTRAMUSCULARLY ONCE EVERY 3 MONTHS. 09/01/16   Nilda Simmer, NP  prochlorperazine (COMPAZINE) 10 MG tablet Take 1 tablet (10 mg total) by mouth every 8 (eight) hours as needed for nausea or vomiting (migaine headache). Patient not taking: Reported on 12/05/2016 05/02/16   Forde Dandy, MD  traZODone (DESYREL) 50 MG tablet Take 1 tablet (50 mg total) by mouth at bedtime as needed. for sleep 11/14/16   Arfeen, Arlyce Harman, MD    Family History Family History  Problem Relation Age of Onset  . Hyperlipidemia Mother   . Hypertension Mother   . Depression Mother   . Physical abuse Mother   . Sexual abuse Mother        possibly  . Sleep apnea Mother   .  Emphysema Paternal Grandmother   . Heart disease Paternal Grandmother   . Bipolar disorder Paternal Grandmother   . Dementia Paternal Grandmother   . Heart disease Maternal Grandmother   . Dementia Maternal Grandmother   . Stroke Maternal Grandfather   . Prostate cancer Maternal Grandfather   . Personality disorder Father   . Bipolar disorder Father   . Alcohol abuse Father   . ADD / ADHD Sister   . Anxiety disorder Brother   . ADD / ADHD Brother   . ADD / ADHD Brother   . Seizures Daughter   . Drug abuse Neg Hx   . OCD Neg Hx   . Paranoid behavior Neg Hx   . Schizophrenia Neg Hx     Social History Social History  Substance Use Topics  . Smoking status: Current  Every Day Smoker    Packs/day: 0.50    Years: 10.00    Types: Cigarettes    Start date: 02/27/2002  . Smokeless tobacco: Never Used     Comment: quit smoking in 2010 during pregnacy then started back Feb. 2011   . 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    Physical Exam Updated Vital Signs BP 132/80 (BP Location: Right Arm)   Pulse 88   Temp 99.3 F (37.4 C) (Oral)   Resp 14   Ht 5\' 5"  (1.651 m)   Wt 88.5 kg (195 lb)   SpO2 94%   BMI 32.45 kg/m   Physical Exam  Constitutional: She is oriented to person, place, and time. She appears well-developed and well-nourished. No distress.  NAD.  HENT:  Head: Normocephalic and atraumatic.  Right Ear: External ear normal.  Left Ear: External ear normal.  Nose: Nose normal.  Eyes: Conjunctivae and EOM are normal. No scleral icterus.  Neck: Normal range of motion. Neck supple.  Cardiovascular: Normal rate, regular rhythm and normal heart sounds.   No murmur heard. Pulses:      Carotid pulses are 2+ on the right side, and 2+ on the left side.      Dorsalis pedis pulses are 2+  on the right side, and 2+ on the left side.  Pulmonary/Chest: Effort normal and breath sounds normal. She has no wheezes.  Abdominal: Soft. There is no tenderness.  Musculoskeletal: Normal range of motion. She exhibits no deformity.  Neurological: She is alert and oriented to person, place, and time.  Skin: Skin is warm and dry. Capillary refill takes less than 2 seconds.  Multiple straight superficial laceration to left ventral forearm consistent with self harm   Psychiatric: Her speech is normal and behavior is normal. She is not actively hallucinating. Cognition and memory are normal. She expresses impulsivity and inappropriate judgment. She exhibits a depressed mood. She expresses suicidal ideation. She expresses suicidal plans.  Nursing note and vitals reviewed.    ED Treatments / Results  Labs (all labs ordered are listed, but only abnormal results are displayed) Labs Reviewed  RAPID URINE DRUG SCREEN, HOSP PERFORMED  COMPREHENSIVE METABOLIC PANEL  ETHANOL  SALICYLATE LEVEL  ACETAMINOPHEN LEVEL  CBC  POC URINE PREG, ED  CBG MONITORING, ED    EKG  EKG Interpretation  Date/Time:  Sunday January 25 2017 20:47:14 EDT Ventricular Rate:  79 PR Interval:  176 QRS Duration: 88 QT Interval:  386 QTC Calculation: 442 R Axis:   93 Text Interpretation:  Normal sinus rhythm Rightward axis Borderline ECG No significant change since last tracing Confirmed by Fredia Sorrow [phone]) on 01/25/2017 9:03:17 PM       Radiology No results found.  Procedures Procedures (including critical care time)  Medications Ordered in ED Medications - No data to display   Initial Impression / Assessment and Plan / ED Course  I have reviewed the triage vital signs and the nursing notes.  Pertinent labs & imaging results that were available during my care of the patient were reviewed by me and considered in my medical decision making (see chart for details).    31 year old female presents  with intentional drug overdose to commit suicide. Has been taking 6-8 gabapentin pills and additional Lamictal  doses over the last 2 days. Woke up earlier today and realized that she was still alive so she attempted to slit her wrists unsuccessfully. She called EMS.  On exam,vital signs are WNL. She is actively having suicidal ideations. No hallucinations. No tremors, abdominal tenderness, nystagmus. We'll get screening labs, Lamictal level and cardiac monitor. Poison control recommending cardiac monitor for 4 hours, assess for HTN, tachycardia, QTc prolongation and agitation.   Final Clinical Impressions(s) / ED Diagnoses   Screening labs normal. Will consult TTS. Will continue to obs on cardiac monitor until 0000.   Final diagnoses:  Intentional drug overdose, initial encounter [facility])  Suicidal behavior with attempted self-injury [facility])    New Prescriptions New Prescriptions   No medications on file     Arlean Hopping 01/25/17 2303    Fredia Sorrow, MD 01/31/17 [phone]

## 2017-01-26 ENCOUNTER — Encounter: Payer: Self-pay | Admitting: Family Medicine

## 2017-01-26 ENCOUNTER — Inpatient Hospital Stay (HOSPITAL_COMMUNITY)
Admission: AD | Admit: 2017-01-26 | Discharge: 2017-01-30 | DRG: 885 | Disposition: A | Discharge status: Home / Self Care | Payer: Federal, State, Local not specified - Other | Source: Intra-hospital | Attending: Psychiatry | Admitting: Psychiatry

## 2017-01-26 ENCOUNTER — Telehealth (HOSPITAL_COMMUNITY): Payer: Self-pay | Admitting: Psychiatry

## 2017-01-26 ENCOUNTER — Encounter (HOSPITAL_COMMUNITY): Payer: Self-pay | Admitting: *Deleted

## 2017-01-26 DIAGNOSIS — I10 Essential (primary) hypertension: Secondary | ICD-10-CM | POA: Diagnosis present

## 2017-01-26 DIAGNOSIS — F314 Bipolar disorder, current episode depressed, severe, without psychotic features: Secondary | ICD-10-CM | POA: Diagnosis present

## 2017-01-26 DIAGNOSIS — G4700 Insomnia, unspecified: Secondary | ICD-10-CM

## 2017-01-26 DIAGNOSIS — T1491XA Suicide attempt, initial encounter: Secondary | ICD-10-CM

## 2017-01-26 DIAGNOSIS — F909 Attention-deficit hyperactivity disorder, unspecified type: Secondary | ICD-10-CM | POA: Diagnosis present

## 2017-01-26 DIAGNOSIS — F17210 Nicotine dependence, cigarettes, uncomplicated: Secondary | ICD-10-CM

## 2017-01-26 DIAGNOSIS — R450 Nervousness: Secondary | ICD-10-CM

## 2017-01-26 DIAGNOSIS — J45909 Unspecified asthma, uncomplicated: Secondary | ICD-10-CM | POA: Diagnosis present

## 2017-01-26 DIAGNOSIS — Z79899 Other long term (current) drug therapy: Secondary | ICD-10-CM | POA: Diagnosis not present

## 2017-01-26 DIAGNOSIS — T426X2A Poisoning by other antiepileptic and sedative-hypnotic drugs, intentional self-harm, initial encounter: Secondary | ICD-10-CM | POA: Diagnosis not present

## 2017-01-26 DIAGNOSIS — Z882 Allergy status to sulfonamides status: Secondary | ICD-10-CM | POA: Diagnosis not present

## 2017-01-26 DIAGNOSIS — Z818 Family history of other mental and behavioral disorders: Secondary | ICD-10-CM | POA: Diagnosis not present

## 2017-01-26 DIAGNOSIS — F39 Unspecified mood [affective] disorder: Secondary | ICD-10-CM

## 2017-01-26 DIAGNOSIS — T50902A Poisoning by unspecified drugs, medicaments and biological substances, intentional self-harm, initial encounter: Secondary | ICD-10-CM | POA: Insufficient documentation

## 2017-01-26 DIAGNOSIS — K219 Gastro-esophageal reflux disease without esophagitis: Secondary | ICD-10-CM | POA: Diagnosis present

## 2017-01-26 DIAGNOSIS — Z915 Personal history of self-harm: Secondary | ICD-10-CM

## 2017-01-26 DIAGNOSIS — Z23 Encounter for immunization: Secondary | ICD-10-CM

## 2017-01-26 DIAGNOSIS — M797 Fibromyalgia: Secondary | ICD-10-CM | POA: Diagnosis present

## 2017-01-26 DIAGNOSIS — K589 Irritable bowel syndrome without diarrhea: Secondary | ICD-10-CM | POA: Diagnosis present

## 2017-01-26 DIAGNOSIS — F419 Anxiety disorder, unspecified: Secondary | ICD-10-CM | POA: Diagnosis present

## 2017-01-26 DIAGNOSIS — Z811 Family history of alcohol abuse and dependence: Secondary | ICD-10-CM

## 2017-01-26 DIAGNOSIS — Z810 Family history of intellectual disabilities: Secondary | ICD-10-CM

## 2017-01-26 MED ORDER — PNEUMOCOCCAL VAC POLYVALENT 25 MCG/0.5ML IJ INJ
0.5000 mL | INJECTION | INTRAMUSCULAR | Status: AC
  Administered 2017-01-27: 0.5 mL via INTRAMUSCULAR

## 2017-01-26 MED ORDER — NICOTINE 21 MG/24HR TD PT24
21.0000 mg | MEDICATED_PATCH | Freq: Every day | TRANSDERMAL | Status: DC
  Administered 2017-01-27 – 2017-01-30 (×4): 21 mg via TRANSDERMAL
  Filled 2017-01-26 (×7): qty 1

## 2017-01-26 MED ORDER — LAMOTRIGINE 100 MG PO TABS
200.0000 mg | ORAL_TABLET | Freq: Every day | ORAL | Status: DC
  Administered 2017-01-26 – 2017-01-29 (×4): 200 mg via ORAL
  Filled 2017-01-26: qty 14
  Filled 2017-01-26 (×3): qty 1
  Filled 2017-01-26: qty 14
  Filled 2017-01-26: qty 1
  Filled 2017-01-26: qty 2
  Filled 2017-01-26: qty 1

## 2017-01-26 MED ORDER — HYDROXYZINE HCL 25 MG PO TABS
25.0000 mg | ORAL_TABLET | Freq: Three times a day (TID) | ORAL | Status: DC
Start: 2017-01-26 — End: 2017-01-30
  Administered 2017-01-26 – 2017-01-30 (×12): 25 mg via ORAL
  Filled 2017-01-26 (×7): qty 1
  Filled 2017-01-26: qty 10
  Filled 2017-01-26 (×2): qty 1
  Filled 2017-01-26: qty 10
  Filled 2017-01-26 (×2): qty 1
  Filled 2017-01-26 (×4): qty 10
  Filled 2017-01-26 (×6): qty 1

## 2017-01-26 MED ORDER — ALUM & MAG HYDROXIDE-SIMETH 200-200-20 MG/5ML PO SUSP
30.0000 mL | ORAL | Status: DC | PRN
  Administered 2017-01-27: 30 mL via ORAL
  Filled 2017-01-26: qty 30

## 2017-01-26 MED ORDER — ACETAMINOPHEN 325 MG PO TABS
650.0000 mg | ORAL_TABLET | Freq: Once | ORAL | Status: AC
  Administered 2017-01-26: 650 mg via ORAL
  Filled 2017-01-26: qty 2

## 2017-01-26 MED ORDER — MAGNESIUM HYDROXIDE 400 MG/5ML PO SUSP: 30.0000 mL | Freq: Every day | ORAL | Status: DC | PRN

## 2017-01-26 MED ORDER — FLUOXETINE HCL 20 MG PO CAPS
40.0000 mg | ORAL_CAPSULE | Freq: Every day | ORAL | Status: DC
  Administered 2017-01-26 – 2017-01-30 (×5): 40 mg via ORAL
  Filled 2017-01-26: qty 14
  Filled 2017-01-26 (×8): qty 2
  Filled 2017-01-26: qty 14

## 2017-01-26 MED ORDER — ALBUTEROL SULFATE (2.5 MG/3ML) 0.083% IN NEBU: 3.0000 mL | INHALATION_SOLUTION | Freq: Four times a day (QID) | RESPIRATORY_TRACT | Status: DC | PRN

## 2017-01-26 MED ORDER — TRAZODONE HCL 50 MG PO TABS
50.0000 mg | ORAL_TABLET | Freq: Every evening | ORAL | Status: DC | PRN
  Administered 2017-01-26 – 2017-01-29 (×4): 50 mg via ORAL
  Filled 2017-01-26 (×3): qty 1
  Filled 2017-01-26: qty 7
  Filled 2017-01-26: qty 1

## 2017-01-26 MED ORDER — NICOTINE 21 MG/24HR TD PT24
21.0000 mg | MEDICATED_PATCH | Freq: Every day | TRANSDERMAL | Status: DC
  Administered 2017-01-26 (×2): 21 mg via TRANSDERMAL
  Filled 2017-01-26 (×2): qty 1

## 2017-01-26 MED ORDER — INFLUENZA VAC SPLIT QUAD 0.5 ML IM SUSY
0.5000 mL | PREFILLED_SYRINGE | INTRAMUSCULAR | Status: AC
  Administered 2017-01-27: 0.5 mL via INTRAMUSCULAR
  Filled 2017-01-26: qty 0.5

## 2017-01-26 MED ORDER — ACETAMINOPHEN 325 MG PO TABS
650.0000 mg | ORAL_TABLET | Freq: Four times a day (QID) | ORAL | Status: DC | PRN
  Administered 2017-01-26: 650 mg via ORAL
  Filled 2017-01-26: qty 2
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
## 2017-01-26 NOTE — BH Assessment (Signed)
[facility] Assessment Progress Note  Per Buford Dresser, DO, this pt requires psychiatric hospitalization at this time.  Letitia Libra, RN, [facility] has assigned pt to [facility] Rm 405-2.  Pt has signed Voluntary Admission and Consent for Treatment, as well as Consent to Release Information to her mother and to Berniece Andreas, MD, and a notification call has been placed to the latter.  Signed forms have been faxed to [facility].  Pt's nurse, Nena Jordan, has been notified, and agrees to send original paperwork along with pt via [facility], and to call report to [phone].  Jalene Mullet, [place] Triage Specialist [phone]

## 2017-01-26 NOTE — ED Notes (Signed)
Patient admitted to unit. Patient states "I feel much better". Denies pain, SI/HI, AH/VH at this time. Requested Nicotine patch - applied. Resting at this time. Will continue to monitor patient.

## 2017-01-26 NOTE — Tx Team (Signed)
Initial Treatment Plan 01/26/2017 5:31 PM Kristin Stephens [id number]    PATIENT STRESSORS: Financial difficulties Marital or family conflict Occupational concerns   PATIENT STRENGTHS: Average or above average intelligence Capable of independent living Communication skills Motivation for treatment/growth Supportive family/friends Work skills   PATIENT IDENTIFIED PROBLEMS: "I need to get back on my medications"  Depression  Hx of alcohol abuse with potential to relapse  Custody issues with family  Suicide risk             DISCHARGE CRITERIA:  Improved stabilization in mood, thinking, and/or behavior Motivation to continue treatment in a less acute level of care Reduction of life-threatening or endangering symptoms to within safe limits  PRELIMINARY DISCHARGE PLAN: Attend 12-step recovery group Outpatient therapy Return to previous living arrangement Return to previous work or school arrangements  PATIENT/FAMILY INVOLVEMENT: This treatment plan has been presented to and reviewed with the patient, Kristin Stephens.  The patient and family have been given the opportunity to ask questions and make suggestions.  Zipporah Plants, RN 01/26/2017, 5:31 PM

## 2017-01-26 NOTE — ED Notes (Signed)
Pt discharged safely with [place] driver.  All belongings were returned to pt.  Pt was tearful at discharge, but contracts for safety.

## 2017-01-26 NOTE — ED Notes (Signed)
Report given to [facility].  Patients mother is on her way from [place] to visit.  I will delay transport for an hour to allow patient to see mother.  Pt is tearful at times and very anxious to see her mother.  Pt contracts for safety.  Pt denies H/I and AVH.  15 minute checks and video monitoring in place.

## 2017-01-26 NOTE — Progress Notes (Signed)
01/26/17 1412:  LRT went to pt room to offer activities.  Pt declined stating she didn't think she would be back here long enough.   Victorino Sparrow, LRT/CTRS

## 2017-01-26 NOTE — ED Notes (Signed)
Bed: [facility] Expected date:  Expected time:  Means of arrival:  Comments: [place]

## 2017-01-27 DIAGNOSIS — F314 Bipolar disorder, current episode depressed, severe, without psychotic features: Principal | ICD-10-CM

## 2017-01-27 DIAGNOSIS — Z818 Family history of other mental and behavioral disorders: Secondary | ICD-10-CM

## 2017-01-27 LAB — LAMOTRIGINE LEVEL: LAMOTRIGINE LVL: 7.3 ug/mL (ref 2.0–20.0)

## 2017-01-27 NOTE — Progress Notes (Signed)
Adult Psychoeducational Group Note  Date:  01/27/2017 Time:  1:10 AM  Group Topic/Focus:  Wrap-Up Group:   The focus of this group is to help patients review their daily goal of treatment and discuss progress on daily workbooks.  Participation Level:  Active  Participation Quality:  Appropriate  Affect:  Appropriate  Cognitive:  Oriented  Insight: Good  Engagement in Group:  Engaged  Modes of Intervention:  Activity  Additional Comments:  Pt rated her day a 3. Goal is to become happier.  Gloriajean Dell Elka Satterfield 01/27/2017, 1:10 AM

## 2017-01-27 NOTE — Progress Notes (Signed)
Recreation Therapy Notes  Animal-Assisted Activity (AAA) Program Checklist/Progress Notes Patient Eligibility Criteria Checklist & Daily Group note for Rec TxIntervention  Date: 10.30.2018 Time: 2:45pm Location: [address] [employer]   AAA/T Program Assumption of Risk Form signed by Patient/ or Parent Legal Guardian Yes  Patient is free of allergies or sever asthma Yes  Patient reports no fear of animals Yes  Patient reports no history of cruelty to animals Yes  Patient understands his/her participation is voluntary Yes  Patient washes hands before animal contact Yes  Patient washes hands after animal contact Yes  Behavioral Response: Appropriate   Education:Hand Washing, Appropriate Animal Interaction   Education Outcome: Acknowledges education.   Clinical Observations/Feedback: Patient attended session and interacted appropriately with therapy dog and peers.   Laureen Ochs Leyda Vanderwerf, LRT/CTRS       Toron Bowring L 01/27/2017 3:04 PM
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
## 2017-01-27 NOTE — Progress Notes (Signed)
D: Pt was at the nurse's station upon initial approach.  Pt presents with depressed affect and mood.  Her speech is rapid as she describes her day to Probation officer.  She reports it "could've been more eventful."  Her goal was "to try not to cry and to be able to go to sleep."  Pt denies SI/HI, denies hallucinations, reports L wrist pain of 2/10.  Pt has been visible in milieu interacting with peers and staff appropriately.  Pt attended evening group.    A: Introduced self to pt.  Actively listened to pt and provided support and encouragement. Medication administered per order.  PRN medication administered for pain and sleep.  Q15 minute safety checks maintained.  R: Pt is safe on the unit.  Pt is compliant with medications.  Pt verbally contracts for safety.  Will continue to monitor and assess.

## 2017-01-27 NOTE — Plan of Care (Signed)
Problem: Safety: Goal: Periods of time without injury will increase Outcome: Progressing Pt has not harmed self or others tonight.  She denies SI/HI and verbally contracts for safety.

## 2017-01-27 NOTE — [facility] Group Notes (Signed)
LCSW Group Therapy Note  01/27/2017 1:15pm  Type of Therapy/Topic:  Group Therapy:  Feelings about Diagnosis  Participation Level:  Active   Description of Group:   This group will allow patients to explore their thoughts and feelings about diagnoses they have received. Patients will be guided to explore their level of understanding and acceptance of these diagnoses. Facilitator will encourage patients to process their thoughts and feelings about the reactions of others to their diagnosis and will guide patients in identifying ways to discuss their diagnosis with significant others in their lives. This group will be process-oriented, with patients participating in exploration of their own experiences, giving and receiving support, and processing challenge from other group members.   Therapeutic Goals: 1. Patient will demonstrate understanding of diagnosis as evidenced by identifying two or more symptoms of the disorder 2. Patient will be able to express two feelings regarding the diagnosis 3. Patient will demonstrate their ability to communicate their needs through discussion and/or role play  Summary of Patient Progress: PT participated in discussion, describing her family's tendency to accept her recovery from alcoholism rather than acknowledge the reality of her bipolar diagnosis. Pt reports that she feels supported, but at times it can be frustrating having people in her life who do not understand. Pt was tearful at times throughout group.    Therapeutic Modalities:   Cognitive Behavioral Therapy Brief Therapy Feelings Identification    Gladstone Lighter, LCSW 01/27/2017 3:57 PM

## 2017-01-27 NOTE — Progress Notes (Signed)
Adult Psychoeducational Group Note  Date:  01/27/2017 Time:  9:39 PM  Group Topic/Focus:  Wrap-Up Group:   The focus of this group is to help patients review their daily goal of treatment and discuss progress on daily workbooks.  Participation Level:  Active  Participation Quality:  Appropriate  Affect:  Appropriate  Cognitive:  Alert  Insight: Good  Engagement in Group:  Engaged  Modes of Intervention:  Activity  Additional Comments:  Pt rated her day an 8. Goal is to form a bond with her sister and be happier.  Donato Heinz 01/27/2017, 9:39 PM

## 2017-01-27 NOTE — Progress Notes (Signed)
Patient ID: Kristin Stephens, female   DOB: September 23, 1985, 31 y.o.   MRN: [id number] D:Affect is flat at times, mood is depressed. States that her goal today is to work on being "happy".Pt was tearful earlier saying she was thinking about "stuff"  And described racing thoughts that she was having some trouble controlling however she did eventually settle and brightened with no further issues. Rates her depression as a 6 and anxiety as a 4 today. Has been participating in all groups and activities this morning. A:Support and encouragement offered. R:Receptive. No complaints of pain or problems at this time.
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
## 2017-01-28 MED ORDER — ARIPIPRAZOLE 5 MG PO TABS
5.0000 mg | ORAL_TABLET | Freq: Every day | ORAL | Status: DC
  Administered 2017-01-29 – 2017-01-30 (×2): 5 mg via ORAL
  Filled 2017-01-28 (×2): qty 1
  Filled 2017-01-28 (×2): qty 7
  Filled 2017-01-28: qty 1

## 2017-01-28 MED ORDER — FLUTICASONE PROPIONATE 50 MCG/ACT NA SUSP
1.0000 | Freq: Two times a day (BID) | NASAL | Status: DC
  Administered 2017-01-28 – 2017-01-30 (×5): 1 via NASAL
  Filled 2017-01-28 (×2): qty 16

## 2017-01-28 NOTE — Progress Notes (Signed)
D: Patient states her depressive symptoms have depressed since admission.  She denies any thoughts of self harm.  Patient has bright affect, however, she becomes tearful at times when talking about her stressors and what brought her to [facility].  She rates her depression as a 2; hopelessness and anxiety as a 1.  Her goal today is to "learn more coping skills and to talk to different people to see what they do to get ideas."  Patient is sleeping and eating well; her energy level is normal and her concentration is good.  She is attending groups on the unit. A: Continue to monitor medication management and MD orders.  Safety checks completed every 15 minutes per protocol.  Offer support and encouragement as needed. R: Patient is receptive to staff; her behavior is appropriate.

## 2017-01-28 NOTE — Progress Notes (Signed)
[facility] MD Progress Note  01/28/2017 4:40 PM Kristin Stephens  MRN:  [id number] Subjective:  Patient reports she is feeling better. States she is particularly pleased because her sister , whom she has had a difficult relationship with, now seems genuinely interested in her well being and mental health issues. Denies medication side effects, but feels that current medications are not fully addressing symptoms. Denies suicidal ideations today. Although feeling better, states she still feels depressed, anxious, and not ready for discharge. She is future oriented,and states she would be interested in going to an IOP Program after discharge if possible. Objective : I have discussed case with treatment team and have met with patient. Patient presents with improving mood compared to admission, but remains labile and was briefly tearful during our session, smiling at others. She describes history of chronic depression and reports episodes of increased energy, less need for sleep, but does not describe any history of full mania or classic hypomanic episodes . She is on Prozac and Lamictal, denies side effects. Interested in [place] for mood stabilization. No disruptive or agitated behaviors on unit, visible in day room, no disruptive or agitated behaviors. Principal Problem: Bipolar 1 disorder, depressed, severe ([place]) Diagnosis:   Patient Active Problem List   Diagnosis Date Noted  . Intentional overdose of drug in tablet form ([place]) [T50.902A] 01/26/2017  . Bipolar 1 disorder, depressed, severe ([place]) [F31.4] 01/26/2017  . Bipolar 2 disorder ([place]) [F31.81] 05/16/2014  . Internal hemorrhoids with complication [H37.1] 69/67/8938  . Vasomotor rhinitis [J30.0] 04/15/2013  . Gastritis without bleeding [K29.70] 03/10/2013  . Anal fissure [K60.2] 03/10/2013  . Obsessive compulsive disorder [F42.9] 03/07/2013  . Fibromyalgia [M79.7] 02/06/2013  . Detachment of glenoid labrum [S43.80XA] 01/13/2013  . Difficulty  in walking(719.7) [R26.2] 02/18/2012  . 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 Asthma, intrinsic [B01.751] 08/28/2010  . ATTENTION DEFICIT DISORDER, ADULT [F98.8] 01/19/2008  . RHINITIS MEDICAMENTOSA [J31.0] 12/08/2007  . ALLERGIC RHINITIS [J30.9] 12/08/2007  . IBS [K58.9] 12/08/2007  . ECZEMA [L25.9] 12/08/2007  . 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 Migraine headache [G43.909] 04/24/2007  . Essential hypertension [I10] 04/24/2007  . ASVD [I70.90] 04/24/2007  . GERD [K21.9] 04/24/2007  . VSD [Q21.0] 04/24/2007  . 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 ADHD (attention deficit hyperactivity disorder)   . Anxiety   . Asthma    daily and prn inhalers  . Bipolar affective disorder ([place])   . Dental crowns present   . Depression   . Deviated nasal septum 05/2012  . Eczema    right leg  . Esophageal reflux   . Fibromyalgia   . Hypertension   . IBS (irritable bowel syndrome)   . Migraine headache   . Nasal turbinate hypertrophy 05/2012   bilateral  . OCD (obsessive compulsive disorder)   . TMJ syndrome     Past Surgical History:  Procedure Laterality Date  . ASD AND VSD REPAIR  1989  . CARDIAC CATHETERIZATION  06/18/2006  . CESAREAN SECTION  04/12/2009  . COARCTATION OF AORTA REPAIR  1989  . COLONOSCOPY N/A 05/20/2013   Procedure: COLONOSCOPY;  Surgeon: Danie Binder, MD;  [facility];  Service: Endoscopy;  Laterality: N/A;  115-moved to [place] notified pt  . ESOPHAGOGASTRODUODENOSCOPY  04/04/2008     Normal esophagus/Mild patchy erythema in the antrum/ Normal duodenal bulb,  normal small bowel biopsy  . FLEXIBLE SIGMOIDOSCOPY  04/04/2008     Small internal hemorrhoids (poor bowel prep)  . HEMORRHOID BANDING N/A 05/20/2013   Procedure: HEMORRHOID BANDING;  Surgeon: Danie Binder, MD;  Location[facility];  Service:  Endoscopy;  Laterality: N/A;  . NASAL SEPTOPLASTY W/ TURBINOPLASTY Bilateral 05/31/2012   Procedure: NASAL SEPTOPLASTY WITH TURBINATE REDUCTION;  Surgeon: Ascencion [place], MD;  Location: [place];  Service: ENT;  Laterality: Bilateral;   Family History:  Family History  Problem Relation Age of Onset  . Hyperlipidemia Mother   . Hypertension Mother   . Depression Mother   . Physical abuse Mother   . Sexual abuse Mother        possibly  . Sleep apnea Mother   . Emphysema Paternal Grandmother   . Heart disease Paternal Grandmother   . Bipolar disorder Paternal Grandmother   . Dementia Paternal Grandmother   . Heart disease Maternal Grandmother   . Dementia Maternal Grandmother   . Stroke Maternal Grandfather   . Prostate cancer Maternal Grandfather   . Personality disorder Father   . Bipolar disorder Father   . Alcohol abuse Father   . ADD / ADHD Sister   . Anxiety disorder Brother   . ADD / ADHD Brother   . ADD / ADHD Brother   . Seizures Daughter   . Drug abuse Neg Hx   . OCD Neg Hx   . Paranoid behavior Neg Hx   . 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  Morphine Sulfate and oxycontin - none since age 58    Social History   Social History  . Marital status: Single    Spouse name: N/A  . Number of children: 1  . Years of education: GED   Occupational History  . [employer]    Social History Main Topics  . Smoking status: Current Every Day Smoker    Packs/day: 0.50    Years: 10.00    Types: Cigarettes    Start date: 02/27/2002  . Smokeless tobacco: Never Used     Comment: quit smoking in 2010 during pregnacy then started back Feb. 2011   . 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 Drug use: No      Comment: marijuana and pain pills; 12/08/07-smoked x 1 month ago- this was the first time in 2.years.  Morphine Sulfate and oxycontin - none since age 15  . Sexual activity: Yes    Birth control/ protection: Injection, Spermicide   Other Topics Concern  . None   Social History Narrative   Lives with 3 roommates   LIKES TO WATCH DR. PIMPLE POPPER. AT AGE 56 LIKED TO PICK HER NOSE AND EAT THE DRIED MUCOUS.    Caffeine: coffee, soda, occasional Red Bull      Additional Social History:   Sleep: Good  Appetite:  Good  Current Medications: Current Facility-Administered Medications  Medication Dose Route Frequency Provider Last Rate Last Dose  . acetaminophen (TYLENOL) tablet 650 mg  650 mg Oral Q6H PRN Ethelene Hal, NP   650 mg at 01/26/17 1941  . albuterol (PROVENTIL) (  2.5 MG/3ML) 0.083% nebulizer solution 3 mL  3 mL Inhalation Q6H PRN Ethelene Hal, NP      . alum & mag hydroxide-simeth (MAALOX/MYLANTA) 200-200-20 MG/5ML suspension 30 mL  30 mL Oral Q4H PRN Ethelene Hal, NP   30 mL at 01/27/17 2237  . [START ON 01/29/2017] ARIPiprazole (ABILIFY) tablet 5 mg  5 mg Oral Daily Cobos, Fernando A, MD      . FLUoxetine (PROZAC) capsule 40 mg  40 mg Oral Daily Ethelene Hal, NP   40 mg at 01/28/17 0800  . fluticasone (FLONASE) 50 MCG/ACT nasal spray 1 spray  1 spray Each Nare BID Money, Lowry Ram, FNP   1 spray at 01/28/17 1204  . hydrOXYzine (ATARAX/VISTARIL) tablet 25 mg  25 mg Oral TID Ethelene Hal, NP   25 mg at 01/28/17 1202  . lamoTRIgine (LAMICTAL) tablet 200 mg  200 mg Oral QHS Ethelene Hal, NP   200 mg at 01/27/17 2227  . magnesium hydroxide (MILK OF MAGNESIA) suspension 30 mL  30 mL Oral Daily PRN Ethelene Hal, NP      . nicotine (NICODERM CQ - dosed in mg/24 hours) patch 21 mg  21 mg Transdermal Daily Ethelene Hal, NP   21 mg at 01/28/17 0800  . traZODone (DESYREL) tablet 50 mg  50 mg Oral QHS PRN Ethelene Hal,  NP   50 mg at 01/27/17 2227    Lab Results: No results found for this or any previous visit (from the past 48 hour(s)).  Blood Alcohol level:  Lab Results  Component Value Date   ETH <10 93/81/0175    Metabolic Disorder Labs: Lab Results  Component Value Date   HGBA1C 5.0 07/17/2014   MPG 97 08/28/2011   No results found for: PROLACTIN Lab Results  Component Value Date   CHOL  02/06/2007    139        ATP III CLASSIFICATION:  <200     mg/dL   Desirable  200-239  mg/dL   Borderline High  >=240    mg/dL   High   TRIG 72 02/06/2007   HDL 33 (L) 02/06/2007   CHOLHDL 4.2 02/06/2007   VLDL 14 02/06/2007   LDLCALC  02/06/2007    92        Total Cholesterol/HDL:CHD Risk Coronary Heart Disease Risk Table                     Men   Women  1/2 Average Risk   3.4   3.3    Physical Findings: AIMS: Facial and Oral Movements Muscles of Facial Expression: None, normal Lips and Perioral Area: None, normal Jaw: None, normal Tongue: None, normal,Extremity Movements Upper (arms, wrists, hands, fingers): None, normal Lower (legs, knees, ankles, toes): None, normal, Trunk Movements Neck, shoulders, hips: None, normal, Overall Severity Severity of abnormal movements (highest score from questions above): None, normal Incapacitation due to abnormal movements: None, normal Patient's awareness of abnormal movements (rate only patient's report): No Awareness, Dental Status Current problems with teeth and/or dentures?: No Does patient usually wear dentures?: No  CIWA:    COWS:     Musculoskeletal: Strength & Muscle Tone: within normal limits Gait & Station: normal Patient leans: N/A  Psychiatric Specialty Exam: Physical Exam  ROS denies chest pain, no shortness of breath, no vomiting   Blood pressure 121/69, pulse (!) 101, temperature 98.5 F (36.9 C), temperature source Oral, resp. rate 20, height  _0  (1.651 m), weight 89.8 kg (198 lb).Body mass index is 32.95 kg/m.  General  Appearance: Fairly Groomed  Eye Contact:  Good  Speech:  Normal Rate  Volume:  Normal  Mood:  reports feeling better   Affect:  labile, improved but remains tearful at times   Thought Process:  Linear and Descriptions of Associations: Intact  Orientation:  Full (Time, Place, and Person)  Thought Content:  denies hallucinations, no delusions, not internally preoccupied   Suicidal Thoughts:  No denies current suicidal or self injurious ideations, denies homicidal or violent ideations   Homicidal Thoughts:  No  Memory:  recent and remote grossly intact   Judgement:  Other:  improving   Insight:  improving   Psychomotor Activity:  Normal  Concentration:  Concentration: Good and Attention Span: Good  Recall:  Good  Fund of Knowledge:  Good  Language:  Good  Akathisia:  Negative  Handed:  Right  AIMS (if indicated):     Assets:  Communication Skills Desire for Improvement Resilience  ADL's:  Intact  Cognition:  WNL  Sleep:  Number of Hours: 6.5    Assessment - patient reports improving mood , less depression, denies current suicidal ideations, but states she still feels labile, anxious about discharging , and remains intermittently tearful. Interested in [place] trial to add to current medication regimen for mood disorder treatment.  Treatment Plan Summary: Daily contact with patient to assess and evaluate symptoms and progress in treatment, Medication management, Plan inpatient treatment  and medications as below Encourage group and milieu participation to work on coping skills and symptom reduction Continue Lamictal 200 mgrs QDAY for mood disorder Continue Prozac 40 mgrs QDAY for depression, anxiety Start Abilify 5 mgrs QDAY for mood disorder/augmentation Continue Vistaril 25 mgrs Q 6 hours PRN for anxiety as needed  Continue Trazodone 50 mgrs QHS PRN for insomnia  Treatment team working on disposition planning - patient plans to follow up with Dr. Adele Schilder for ongoing outpatient  treatment following discharge Jenne Campus, MD 01/28/2017, 4:40 PM

## 2017-01-28 NOTE — Plan of Care (Signed)
Problem: Activity: Goal: Sleeping patterns will improve Outcome: Progressing Slept 6.25 hours last night according to flowsheet.

## 2017-01-28 NOTE — Plan of Care (Signed)
Problem: Education: Goal: Emotional status will improve Outcome: Progressing Patient's affect is bright and her mood has improved since admission.  She denies any thoughts of self harm.

## 2017-01-28 NOTE — Plan of Care (Signed)
Problem: Safety: Goal: Periods of time without injury will increase Outcome: Progressing Pt safe on the unit at this time   

## 2017-01-28 NOTE — Progress Notes (Signed)
D: Pt was in the dayroopm upon initial approach.  Pt presents with appropriate affect and depressed mood.  She describes her mood as "better today since I was finally here and got through the first night."  Reports good visit with her mother tonight.  Pt denies SI/HI, denies hallucinations, denies pain.  Pt discussed how her sister is "trying to learn more about Bipolar disorder and that made me really happy."  Reports she and her sister have had strained relationship.  Pt states "the social worker is going to help me find support groups and a therapist I can go to without having to pay for it."  Pt discussed how she pays out of pocket for medications and doctor visits, so she is unable to pay for much more on her budget.  She is engaged in her treatment and shows insight into her situation as she describes the steps she hopes to take while at [facility] and after discharge.  She reports her goal today was "just to be happier" and she accomplished this by "talking to people, talking to staff."  Pt has been visible in milieu interacting with peers and staff appropriately.  Pt attended evening group.    A: Introduced self to pt.  Actively listened to pt and offered support and encouragement. Medication administered per order.  PRN medication administered for sleep and indigestion.  Q15 minute safety checks maintained.    R: Pt is safe on the unit.  Pt is compliant with medications.  Pt verbally contracts for safety.  Will continue to monitor and assess.

## 2017-01-28 NOTE — Tx Team (Signed)
Interdisciplinary Treatment and Diagnostic Plan Update  01/28/2017 Time of Session: 9:30am Kristin Stephens MRN: [id number]  Principal Diagnosis: Bipolar 1 disorder, depressed, severe ([place])  Secondary Diagnoses: Principal Problem:   Bipolar 1 disorder, depressed, severe ([place])   Current Medications:  Current Facility-Administered Medications  Medication Dose Route Frequency Provider Last Rate Last Dose  . acetaminophen (TYLENOL) tablet 650 mg  650 mg Oral Q6H PRN Ethelene Hal, NP   650 mg at 01/26/17 1941  . albuterol (PROVENTIL) (2.5 MG/3ML) 0.083% nebulizer solution 3 mL  3 mL Inhalation Q6H PRN Ethelene Hal, NP      . alum & mag hydroxide-simeth (MAALOX/MYLANTA) 200-200-20 MG/5ML suspension 30 mL  30 mL Oral Q4H PRN Ethelene Hal, NP   30 mL at 01/27/17 2237  . FLUoxetine (PROZAC) capsule 40 mg  40 mg Oral Daily Ethelene Hal, NP   40 mg at 01/28/17 0800  . hydrOXYzine (ATARAX/VISTARIL) tablet 25 mg  25 mg Oral TID Ethelene Hal, NP   25 mg at 01/28/17 0800  . lamoTRIgine (LAMICTAL) tablet 200 mg  200 mg Oral QHS Ethelene Hal, NP   200 mg at 01/27/17 2227  . magnesium hydroxide (MILK OF MAGNESIA) suspension 30 mL  30 mL Oral Daily PRN Ethelene Hal, NP      . nicotine (NICODERM CQ - dosed in mg/24 hours) patch 21 mg  21 mg Transdermal Daily Ethelene Hal, NP   21 mg at 01/28/17 0800  . traZODone (DESYREL) tablet 50 mg  50 mg Oral QHS PRN Ethelene Hal, NP   50 mg at 01/27/17 2227    PTA Medications: Prescriptions Prior to Admission  Medication Sig Dispense Refill Last Dose  . acetaminophen (TYLENOL) 500 MG tablet Take 1,000 mg by mouth every 6 (six) hours as needed for mild pain, moderate pain, fever or headache.   Past Week at Unknown time  . albuterol (PROVENTIL HFA;VENTOLIN HFA) 108 (90 Base) MCG/ACT inhaler Inhale 1-2 puffs into the lungs every 6 (six) hours as needed for wheezing or shortness of breath.    unknown  . albuterol (PROVENTIL) (2.5 MG/3ML) 0.083% nebulizer solution Take 2.5 mg by nebulization every 6 (six) hours as needed for wheezing or shortness of breath.    unknown  . amphetamine-dextroamphetamine (ADDERALL XR) 20 MG 24 hr capsule Take 1 capsule (20 mg total) by mouth daily. 30 capsule 0 Past Week at Unknown time  . FLUoxetine (PROZAC) 40 MG capsule Take 1 capsule (40 mg total) by mouth daily. 30 capsule 2 01/25/2017 at Unknown time  . fluticasone (FLONASE) 50 MCG/ACT nasal spray USE 2 SPRAYS IN EACH NOSTRIL EVERY DAY (Patient not taking: Reported on 01/26/2017) 16 g 5 Not Taking at Unknown time  . gabapentin (NEURONTIN) 300 MG capsule TAKE 2 CAPSULES BY MOUTH EVERY MORNING, 1 CAPSULE EVERY AFTERNOON, AND 2 CAPSULES EVERY EVENING 150 capsule 5 01/25/2017 at Unknown time  . HYDROcodone-acetaminophen (NORCO) 7.5-325 MG tablet Take 1 tablet by mouth daily as needed for moderate pain or severe pain. (Patient not taking: Reported on 01/26/2017) 20 tablet 0 Not Taking at Unknown time  . lamoTRIgine (LAMICTAL) 100 MG tablet Take 200 mg by mouth at bedtime.   01/25/2017 at Unknown time  . lamoTRIgine (LAMICTAL) 150 MG tablet Take 1 tablet (150 mg total) by mouth 2 (two) times daily. (Patient not taking: Reported on 01/26/2017) 60 tablet 2 Not Taking at Unknown time  . LORazepam (ATIVAN) 0.5 MG tablet Take 0.5  mg by mouth every 6 (six) hours as needed for anxiety.   unknown  . MedroxyPROGESTERone Acetate 150 MG/ML SUSY INJECT 150 MG (1 SYRINGE) INTRAMUSCULARLY ONCE EVERY 3 MONTHS. 1 mL 3 11/2016  . traZODone (DESYREL) 50 MG tablet Take 1 tablet (50 mg total) by mouth at bedtime as needed. for sleep 30 tablet 1 Past Week at Unknown time    Treatment Modalities: Medication Management, Group therapy, Case management,  1 to 1 session with clinician, Psychoeducation, Recreational therapy.  Patient Stressors: Financial difficulties Marital or family conflict Occupational concerns  Patient  Strengths: Average or above average intelligence Capable of independent living Agricultural engineer for treatment/growth Supportive family/friends Work Artist for Primary Diagnosis: Bipolar 1 disorder, depressed, severe ([place]) Long Term Goal(s): Improvement in symptoms so as ready for discharge  Short Term Goals: Ability to maintain clinical measurements within normal limits will improve Compliance with prescribed medications will improve Ability to verbalize feelings will improve Ability to disclose and discuss suicidal ideas Ability to demonstrate self-control will improve  Medication Management: Evaluate patient's response, side effects, and tolerance of medication regimen.  Therapeutic Interventions: 1 to 1 sessions, Unit Group sessions and Medication administration.  Evaluation of Outcomes: Not Met  Physician Treatment Plan for Secondary Diagnosis: Principal Problem:   Bipolar 1 disorder, depressed, severe ([place])   Long Term Goal(s): Improvement in symptoms so as ready for discharge  Short Term Goals: Ability to maintain clinical measurements within normal limits will improve Compliance with prescribed medications will improve Ability to verbalize feelings will improve Ability to disclose and discuss suicidal ideas Ability to demonstrate self-control will improve  Medication Management: Evaluate patient's response, side effects, and tolerance of medication regimen.  Therapeutic Interventions: 1 to 1 sessions, Unit Group sessions and Medication administration.  Evaluation of Outcomes: Not Met   RN Treatment Plan for Primary Diagnosis: Bipolar 1 disorder, depressed, severe ([place]) Long Term Goal(s): Knowledge of disease and therapeutic regimen to maintain health will improve  Short Term Goals: Ability to disclose and discuss suicidal ideas, Ability to identify and develop effective coping behaviors will improve and Compliance with  prescribed medications will improve  Medication Management: RN will administer medications as ordered by provider, will assess and evaluate patient's response and provide education to patient for prescribed medication. RN will report any adverse and/or side effects to prescribing provider.  Therapeutic Interventions: 1 on 1 counseling sessions, Psychoeducation, Medication administration, Evaluate responses to treatment, Monitor vital signs and CBGs as ordered, Perform/monitor CIWA, COWS, AIMS and Fall Risk screenings as ordered, Perform wound care treatments as ordered.  Evaluation of Outcomes: Not Met   LCSW Treatment Plan for Primary Diagnosis: Bipolar 1 disorder, depressed, severe ([place]) Long Term Goal(s): Safe transition to appropriate next level of care at discharge, Engage patient in therapeutic group addressing interpersonal concerns.  Short Term Goals: Engage patient in aftercare planning with referrals and resources and Increase skills for wellness and recovery  Therapeutic Interventions: Assess for all discharge needs, 1 to 1 time with Social worker, Explore available resources and support systems, Assess for adequacy in community support network, Educate family and significant other(s) on suicide prevention, Complete Psychosocial Assessment, Interpersonal group therapy.  Evaluation of Outcomes: Not Met   Progress in Treatment: Attending groups: Yes  Participating in groups: Yes Taking medication as prescribed: Yes, MD continues to assess for medication changes as needed Toleration medication: Yes, no side effects reported at this time Family/Significant other contact made: No, CSW assessing for appropriate  contact Patient understands diagnosis: Yes AEB willingness to participate in treatment Discussing patient identified problems/goals with staff: Yes Medical problems stabilized or resolved: Yes Denies suicidal/homicidal ideation: Yes Issues/concerns per patient self-inventory:  None Other: N/A  New problem(s) identified: None identified at this time.   New Short Term/Long Term Goal(s): "coping skills"  Discharge Plan or Barriers: Pt will return home and follow-up with outpatient services at [facility] with Dr. Adele Schilder and with [facility]. Pt also plans to stay with mother temporarily after discharge for added support  Reason for Continuation of Hospitalization: Anxiety Depression Medication stabilization  Estimated Length of Stay: 3-5 days; est DC date 11/3  Attendees: Patient: Kristin Stephens  01/28/2017  8:21 AM  Physician: Dr. Parke Poisson, MD 01/28/2017  8:21 AM  Nursing: Mary Sella RN; Chrys Racer, RN 01/28/2017  8:21 AM  RN Care Manager: 01/28/2017  8:21 AM  Social Worker: Adriana Reams, LCSW 01/28/2017  8:21 AM  Recreational Therapist:  01/28/2017  8:21 AM  Other:  01/28/2017  8:21 AM  Other:  01/28/2017  8:21 AM  Other: 01/28/2017  8:21 AM    Scribe for Treatment Team: Gladstone Lighter, LCSW 01/28/2017 8:21 AM

## 2017-01-28 NOTE — [facility] Group Notes (Signed)
[facility] Group Therapy 01/28/2017 1:15pm  Type of Therapy: Mental Health Association Presentation  Participation Level: Active  Participation Quality: Attentive  Affect: Appropriate  Cognitive: Oriented  Insight: Developing/Improving  Engagement in Therapy: Engaged  Modes of Intervention: Discussion, Education and Socialization  Summary of Progress/Problems: Kristin Stephens ([place]) Speaker came to talk about his personal journey with mental health. The pt processed ways by which to relate to the speaker. [place] speaker provided handouts and educational information pertaining to groups and services offered by the [facility]. Pt was engaged in speaker's presentation and was receptive to resources provided.    Gladstone Lighter, LCSW 01/28/2017 4:43 PM

## 2017-01-28 NOTE — Progress Notes (Signed)
D: Pt denies SI/HI/AVH. Pt is pleasant and cooperative. Pt interacts great with her peers, pt seen on the unit acting appropriately, pt stated she was feeling better due to not being in the same state of mind when she came in to the hospital.   A: Pt was offered support and encouragement. Pt was given scheduled medications. Pt was encourage to attend groups. Q 15 minute checks were done for safety.   R:Pt attends groups and interacts well with peers and staff. Pt is taking medication. Pt has no complaints at this time.Pt receptive to treatment and safety maintained on unit.

## 2017-01-28 NOTE — BHH Suicide Risk Assessment (Signed)
[place] INPATIENT:  Family/Significant Other Suicide Prevention Education  Suicide Prevention Education:  Education Completed; Jaydalee Bardwell, Pt's mother [phone], has been identified by the patient as the family member/significant other with whom the patient will be residing, and identified as the person(s) who will aid the patient in the event of a mental health crisis (suicidal ideations/suicide attempt).  With written consent from the patient, the family member/significant other has been provided the following suicide prevention education, prior to the and/or following the discharge of the patient.  The suicide prevention education provided includes the following:  Suicide risk factors  Suicide prevention and interventions  National Suicide Hotline telephone number  [facility] assessment telephone number  [facility] Emergency Assistance [place] and/or Residential Mobile Crisis Unit telephone number  Request made of family/significant other to:  Remove weapons (e.g., guns, rifles, knives), all items previously/currently identified as safety concern.    Remove drugs/medications (over-the-counter, prescriptions, illicit drugs), all items previously/currently identified as a safety concern.  The family member/significant other verbalizes understanding of the suicide prevention education information provided.  The family member/significant other agrees to remove the items of safety concern listed above.  Pt's mother is concerned with Pt's lack of motivation to find a job and try to function in society. She feels that Pt should not come live with her, but can come visit for a few days. Mother expresses support but hopes to see Pt take initiative in recovery. CSW referred family to [facility] chapter in [place] for further education from the family.  Gladstone Lighter 01/28/2017, 3:38 PM

## 2017-01-29 DIAGNOSIS — Z810 Family history of intellectual disabilities: Secondary | ICD-10-CM

## 2017-01-29 DIAGNOSIS — F39 Unspecified mood [affective] disorder: Secondary | ICD-10-CM

## 2017-01-29 DIAGNOSIS — R450 Nervousness: Secondary | ICD-10-CM

## 2017-01-29 DIAGNOSIS — F419 Anxiety disorder, unspecified: Secondary | ICD-10-CM

## 2017-01-29 DIAGNOSIS — T426X2A Poisoning by other antiepileptic and sedative-hypnotic drugs, intentional self-harm, initial encounter: Secondary | ICD-10-CM

## 2017-01-29 DIAGNOSIS — Z811 Family history of alcohol abuse and dependence: Secondary | ICD-10-CM

## 2017-01-29 DIAGNOSIS — T1491XA Suicide attempt, initial encounter: Secondary | ICD-10-CM

## 2017-01-29 DIAGNOSIS — F17210 Nicotine dependence, cigarettes, uncomplicated: Secondary | ICD-10-CM

## 2017-01-29 DIAGNOSIS — G4700 Insomnia, unspecified: Secondary | ICD-10-CM

## 2017-01-29 NOTE — Progress Notes (Signed)
[facility] MD Progress Note  01/29/2017 3:29 PM Kristin Stephens  MRN:  [id number]   Subjective:  Patient reports she is doing well and has felt a little groggy today. Patient denies any side effects from medications. She denies any SI/HI/AVH and rates no depression and minor anxiety about discharge and taking care of things at home. She states that she will probably discharged tomorrow and she is ready and wants to follow up with Dr. Adele Schilder and go to IOP again.  Objective: Patient's chart and findings reviewed and discussed with treatment team. Patient presents pleasant and cooperative. She has been in the day room most of the day and interacting appropriately. She has been attending group and participating. Patient will possibly discharged tomorrow. CSW will refer to IOP a patient's request.  Principal Problem: Bipolar 1 disorder, depressed, severe ([place]) Diagnosis:   Patient Active Problem List   Diagnosis Date Noted  . Intentional overdose of drug in tablet form ([place]) [T50.902A] 01/26/2017  . Bipolar 1 disorder, depressed, severe ([place]) [F31.4] 01/26/2017  . Bipolar 2 disorder ([place]) [F31.81] 05/16/2014  . Internal hemorrhoids with complication [V67.2] 09/47/0962  . Vasomotor rhinitis [J30.0] 04/15/2013  . Gastritis without bleeding [K29.70] 03/10/2013  . Anal fissure [K60.2] 03/10/2013  . Obsessive compulsive disorder [F42.9] 03/07/2013  . Fibromyalgia [M79.7] 02/06/2013  . Detachment of glenoid labrum [S43.80XA] 01/13/2013  . Difficulty in walking(719.7) [R26.2] 02/18/2012  . Seasonal affective disorder ([place]) [F33.8] 11/25/2011  . Asthma, intrinsic [E36.629] 08/28/2010  . ATTENTION DEFICIT DISORDER, ADULT [F98.8] 01/19/2008  . RHINITIS MEDICAMENTOSA [J31.0] 12/08/2007  . ALLERGIC RHINITIS [J30.9] 12/08/2007  . IBS [K58.9] 12/08/2007  . ECZEMA [L25.9] 12/08/2007  . 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 Migraine headache [G43.909] 04/24/2007  . Essential hypertension [I10]  04/24/2007  . ASVD [I70.90] 04/24/2007  . GERD [K21.9] 04/24/2007  . VSD [Q21.0] 04/24/2007  . 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 ADHD (attention deficit hyperactivity disorder)   . Anxiety   . Asthma    daily and prn inhalers  . Bipolar affective disorder ([place])   . Dental crowns present   . Depression   . Deviated nasal septum 05/2012  . Eczema    right leg  . Esophageal reflux   . Fibromyalgia   . Hypertension   . IBS (irritable bowel syndrome)   . Migraine headache   . Nasal turbinate hypertrophy 05/2012   bilateral  . OCD (obsessive compulsive disorder)   . TMJ syndrome     Past Surgical History:  Procedure Laterality Date  . ASD AND VSD REPAIR  1989  . CARDIAC CATHETERIZATION  06/18/2006  . CESAREAN SECTION  04/12/2009  . COARCTATION OF AORTA REPAIR  1989  . COLONOSCOPY N/A 05/20/2013   Procedure: COLONOSCOPY;  Surgeon: Danie Binder, MD;  [facility];  Service: Endoscopy;  Laterality: N/A;  115-moved to [place] notified pt  . ESOPHAGOGASTRODUODENOSCOPY  04/04/2008     Normal esophagus/Mild patchy erythema in the antrum/ Normal duodenal bulb, normal small bowel biopsy  . FLEXIBLE SIGMOIDOSCOPY  04/04/2008     Small internal hemorrhoids (poor bowel prep)  . HEMORRHOID BANDING N/A 05/20/2013   Procedure: HEMORRHOID BANDING;  Surgeon: Danie Binder, MD;  Location: [facility];  Service: Endoscopy;  Laterality: N/A;  . NASAL SEPTOPLASTY W/ TURBINOPLASTY Bilateral 05/31/2012  Procedure: NASAL SEPTOPLASTY WITH TURBINATE REDUCTION;  Surgeon: Ascencion Dike, MD;  Location: [facility];  Service: ENT;  Laterality: Bilateral;   Family History:  Family History  Problem Relation Age of Onset  . Hyperlipidemia Mother   . Hypertension Mother   . Depression Mother   . Physical abuse Mother   .  Sexual abuse Mother        possibly  . Sleep apnea Mother   . Emphysema Paternal Grandmother   . Heart disease Paternal Grandmother   . Bipolar disorder Paternal Grandmother   . Dementia Paternal Grandmother   . Heart disease Maternal Grandmother   . Dementia Maternal Grandmother   . Stroke Maternal Grandfather   . Prostate cancer Maternal Grandfather   . Personality disorder Father   . Bipolar disorder Father   . Alcohol abuse Father   . ADD / ADHD Sister   . Anxiety disorder Brother   . ADD / ADHD Brother   . ADD / ADHD Brother   . Seizures Daughter   . Drug abuse Neg Hx   . OCD Neg Hx   . Paranoid behavior Neg Hx   . 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  Morphine Sulfate and oxycontin - none since age 62    Social History   Social History  . Marital status: Single    Spouse name: N/A  . Number of children: 1  . Years of education: GED   Occupational History  . [employer]    Social History Main Topics  . Smoking status: Current Every Day Smoker    Packs/day: 0.50    Years: 10.00    Types: Cigarettes    Start date: 02/27/2002  . Smokeless tobacco: Never Used     Comment: quit smoking in 2010 during pregnacy then started back Feb. 2011   . 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 Drug use: No     Comment: marijuana and pain pills; 12/08/07-smoked x 1 month ago- this was the first time in 2.years.  Morphine Sulfate and oxycontin - none since age 51  . Sexual activity: Yes    Birth control/ protection: Injection, Spermicide   Other Topics Concern  . None   Social History Narrative   Lives with 3 roommates   LIKES TO WATCH DR. PIMPLE POPPER. AT AGE 65 LIKED TO PICK HER NOSE AND  EAT THE DRIED MUCOUS.    Caffeine: coffee, soda, occasional Red Bull      Additional Social History:                         Sleep: Good  Appetite:  Good  Current Medications: Current Facility-Administered Medications  Medication Dose Route Frequency Provider Last Rate Last Dose  . acetaminophen (TYLENOL) tablet 650 mg  650 mg Oral Q6H PRN Ethelene Hal, NP   650 mg at 01/26/17 1941  . albuterol (PROVENTIL) (2.5 MG/3ML) 0.083% nebulizer solution 3 mL  3 mL Inhalation Q6H PRN Ethelene Hal, NP      . alum & mag hydroxide-simeth (MAALOX/MYLANTA) 200-200-20 MG/5ML  suspension 30 mL  30 mL Oral Q4H PRN Ethelene Hal, NP   30 mL at 01/27/17 2237  . ARIPiprazole (ABILIFY) tablet 5 mg  5 mg Oral Daily Cobos, Myer Peer, MD   5 mg at 01/29/17 0810  . FLUoxetine (PROZAC) capsule 40 mg  40 mg Oral Daily Ethelene Hal, NP   40 mg at 01/29/17 0810  . fluticasone (FLONASE) 50 MCG/ACT nasal spray 1 spray  1 spray Each Nare BID Money, Lowry Ram, FNP   1 spray at 01/29/17 9811  . hydrOXYzine (ATARAX/VISTARIL) tablet 25 mg  25 mg Oral TID Ethelene Hal, NP   25 mg at 01/29/17 1208  . lamoTRIgine (LAMICTAL) tablet 200 mg  200 mg Oral QHS Ethelene Hal, NP   200 mg at 01/28/17 2117  . magnesium hydroxide (MILK OF MAGNESIA) suspension 30 mL  30 mL Oral Daily PRN Ethelene Hal, NP      . nicotine (NICODERM CQ - dosed in mg/24 hours) patch 21 mg  21 mg Transdermal Daily Ethelene Hal, NP   21 mg at 01/29/17 9147  . traZODone (DESYREL) tablet 50 mg  50 mg Oral QHS PRN Ethelene Hal, NP   50 mg at 01/28/17 2117    Lab Results: No results found for this or any previous visit (from the past 48 hour(s)).  Blood Alcohol level:  Lab Results  Component Value Date   ETH <10 82/95/6213    Metabolic Disorder Labs: Lab Results  Component Value Date   HGBA1C 5.0 07/17/2014   MPG 97 08/28/2011   No results found for: PROLACTIN Lab  Results  Component Value Date   CHOL  02/06/2007    139        ATP III CLASSIFICATION:  <200     mg/dL   Desirable  200-239  mg/dL   Borderline High  >=240    mg/dL   High   TRIG 72 02/06/2007   HDL 33 (L) 02/06/2007   CHOLHDL 4.2 02/06/2007   VLDL 14 02/06/2007   LDLCALC  02/06/2007    92        Total Cholesterol/HDL:CHD Risk Coronary Heart Disease Risk Table                     Men   Women  1/2 Average Risk   3.4   3.3    Physical Findings: AIMS: Facial and Oral Movements Muscles of Facial Expression: None, normal Lips and Perioral Area: None, normal Jaw: None, normal Tongue: None, normal,Extremity Movements Upper (arms, wrists, hands, fingers): None, normal Lower (legs, knees, ankles, toes): None, normal, Trunk Movements Neck, shoulders, hips: None, normal, Overall Severity Severity of abnormal movements (highest score from questions above): None, normal Incapacitation due to abnormal movements: None, normal Patient's awareness of abnormal movements (rate only patient's report): No Awareness, Dental Status Current problems with teeth and/or dentures?: No Does patient usually wear dentures?: No  CIWA:    COWS:     Musculoskeletal: Strength & Muscle Tone: within normal limits Gait & Station: normal Patient leans: N/A  Psychiatric Specialty Exam: Physical Exam  Nursing note and vitals reviewed. Constitutional: She is oriented to person, place, and time. She appears well-developed and well-nourished.  Cardiovascular: Normal rate.   Respiratory: Effort normal.  Musculoskeletal: Normal range of motion.  Neurological: She is alert and oriented to person, place, and time.  Skin: Skin is warm.    Review of Systems  Constitutional: Negative.  HENT: Negative.   Eyes: Negative.   Respiratory: Negative.   Cardiovascular: Negative.   Gastrointestinal: Negative.   Genitourinary: Negative.   Musculoskeletal: Negative.   Skin: Negative.   Neurological: Negative.    Endo/Heme/Allergies: Negative.   Psychiatric/Behavioral: The patient is nervous/anxious.     Blood pressure 125/77, pulse 87, temperature 97.9 F (36.6 C), temperature source Oral, resp. rate 20, height 5\' 5"  (1.651 m), weight 89.8 kg (198 lb).Body mass index is 32.95 kg/m.  General Appearance: Casual  Eye Contact:  Good  Speech:  Clear and Coherent and Normal Rate  Volume:  Normal  Mood:  Anxious  Affect:  Appropriate  Thought Process:  Goal Directed and Descriptions of Associations: Intact  Orientation:  Full (Time, Place, and Person)  Thought Content:  WDL  Suicidal Thoughts:  No  Homicidal Thoughts:  No  Memory:  Immediate;   Good Recent;   Good Remote;   Good  Judgement:  Good  Insight:  Good  Psychomotor Activity:  Normal  Concentration:  Concentration: Good and Attention Span: Good  Recall:  Good  Fund of Knowledge:  Good  Language:  Good  Akathisia:  No  Handed:  Right  AIMS (if indicated):     Assets:  Communication Skills Desire for Improvement Financial Resources/Insurance Housing Physical Health Social Support Transportation  ADL's:  Intact  Cognition:  WNL  Sleep:  Number of Hours: 6.75   Problems Addressed: Bipolar I   Treatment Plan Summary: Daily contact with patient to assess and evaluate symptoms and progress in treatment, Medication management and Plan is to:  -Continue Abilify 5 mg PO Daily for mood stability -Continue Lamictal 200 mg PO Daily for mood stability -Continue Prozac 40 mg PO Daily for mood stability -Continue Trazodone 50 mg PO QHS PRN for insomnia -Continue Vistaril 25 mg PO TID PRN for anxiety -Encourage group therapy participation  Lewis Shock, FNP 01/29/2017, 3:29 PM   Agree with NP Progress Note

## 2017-01-29 NOTE — Progress Notes (Signed)
DAR NOTE: Pt present with bright affect and pleasant mood in the unit. Pt has been in the dayroom most of the day interacting with peers.. Pt denies physical pain, took all her meds as scheduled. As per self inventory, pt had a good night sleep, good appetite, normal energy, and good concentration. Pt rate depression at 1, hopeless ness at 0, and anxiety at 2. Pt's safety ensured with 15 minute and environmental checks. Pt currently denies SI/HI and A/V hallucinations. Pt verbally agrees to seek staff if SI/HI or A/VH occurs and to consult with staff before acting on these thoughts. Will continue POC.

## 2017-01-29 NOTE — Progress Notes (Signed)
Adult Psychoeducational Group Note  Date:  01/29/2017 Time:  9:45 PM  Group Topic/Focus:  Wrap-Up Group:   The focus of this group is to help patients review their daily goal of treatment and discuss progress on daily workbooks.  Participation Level:  Active  Participation Quality:  Appropriate  Affect:  Appropriate  Cognitive:  Appropriate  Insight: Appropriate  Engagement in Group:  Engaged  Modes of Intervention:  Discussion  Additional Comments:  Patient attended wrap-up group and said that her day was a 8.5. Her coping skills for today were socializing and having phone conversations with her mom.  Patient was also excited that she will be discharged tomorrow.  Hina Gupta W Jordane Hisle 68/03/5945, 9:45 PM

## 2017-01-29 NOTE — [facility] Group Notes (Signed)
LCSW Group Therapy Note  01/29/2017 1:15pm  Type of Therapy/Topic:  Group Therapy:  Balance in Life  Participation Level:  Active  Description of Group:    This group will address the concept of balance and how it feels and looks when one is unbalanced. Patients will be encouraged to process areas in their lives that are out of balance and identify reasons for remaining unbalanced. Facilitators will guide patients in utilizing problem-solving interventions to address and correct the stressor making their life unbalanced. Understanding and applying boundaries will be explored and addressed for obtaining and maintaining a balanced life. Patients will be encouraged to explore ways to assertively make their unbalanced needs known to significant others in their lives, using other group members and facilitator for support and feedback.  Therapeutic Goals: 1. Patient will identify two or more emotions or situations they have that consume much of in their lives. 2. Patient will identify signs/triggers that life has become out of balance:  3. Patient will identify two ways to set boundaries in order to achieve balance in their lives:  4. Patient will demonstrate ability to communicate their needs through discussion and/or role plays  Summary of Patient Progress: Pt was present for the duration of the group. Pt states that she is feeling balanced. Pt reports that she plans to follow up outpatient upon discharge and she also plans to utilize the coping skills that she has learned in the hospital. Pt reports that in the past she has become unbalanced when she doesn't take her meds but she is committed to continuing to take her meds upon discharge. Pt's affect was appropriate and she offered meaningful feedback to peers.     Therapeutic Modalities:   Cognitive Behavioral Therapy Solution-Focused Therapy Assertiveness Training  Georga Kaufmann, MSW, [place] 01/29/2017 3:01 PM

## 2017-01-30 ENCOUNTER — Encounter (HOSPITAL_COMMUNITY): Payer: Self-pay | Admitting: Behavioral Health

## 2017-01-30 MED ORDER — TRAZODONE HCL 50 MG PO TABS: 50.0000 mg | ORAL_TABLET | Freq: Every evening | ORAL | 0 refills | Status: DC | PRN

## 2017-01-30 MED ORDER — NICOTINE 21 MG/24HR TD PT24: 21.0000 mg | MEDICATED_PATCH | Freq: Every day | TRANSDERMAL | 0 refills | Status: DC

## 2017-01-30 MED ORDER — HYDROXYZINE HCL 25 MG PO TABS: 25.0000 mg | ORAL_TABLET | Freq: Three times a day (TID) | ORAL | 0 refills | Status: DC

## 2017-01-30 MED ORDER — LAMOTRIGINE 200 MG PO TABS
200.0000 mg | ORAL_TABLET | Freq: Every day | ORAL | 0 refills | Status: DC
Start: 2017-01-30 — End: 2017-02-05

## 2017-01-30 MED ORDER — ARIPIPRAZOLE 5 MG PO TABS: 5.0000 mg | ORAL_TABLET | Freq: Every day | ORAL | 0 refills | Status: DC

## 2017-01-30 MED ORDER — FLUOXETINE HCL 40 MG PO CAPS: 40.0000 mg | ORAL_CAPSULE | Freq: Every day | ORAL | 0 refills | Status: DC

## 2017-01-30 NOTE — Progress Notes (Signed)
D: Pt observe on the dayroom interacting with peers. Pt at the time of assessment denied any depression, SI, HI, depression, anxiety, or AVH; "I am ready to leave tomorrow. Pt could be a little hyperactive. Pt remained very pleasant.  A: Medications offered as prescribed. Pt given the opportunity to ask questions and state concerns. Support, encouragement, and safe environment provided. R: Pt was med compliant. All patient's questions and concerns addressed. 15-minute safety checks continue. Safety checks continue. Pt did attend wrap-up group.

## 2017-01-30 NOTE — Progress Notes (Signed)
  [facility] Adult Case Management Discharge Plan :  Will you be returning to the same living situation after discharge:  Yes,  pt returning home. At discharge, do you have transportation home?: Yes,  pt has access to transportation. Do you have the ability to pay for your medications: Yes,  prescriptions and samples provided.  Release of information consent forms completed and in the chart;  Patient's signature needed at discharge.  Patient to Follow up at: Follow-up [facility] Follow up on 02/05/2017.   Specialty:  Behavioral Health Why:  at 8:45am for medication management with Dr. Adele Schilder.  Contact information: [place] [phone]       [facility] Follow up on 02/11/2017.   Specialty:  Behavioral Health Why:  at 2:00pm for your initial assessment for therapy. Please arrive at 1:30pm to complete new patient paperwork.  Contact information: [place], [address] [place] [address] [place] Follow up on 02/03/2017.   Specialty:  Behavioral Health Why:  Assessment appointment for the Intensive Outpatient Program 11/6 at 9:00am. Please call the office if you need to cancel or reschedule your appointment. Thank you! Contact information: [place] [id number] [place] [phone]          Next level of care provider has access to [place] and Suicide Prevention discussed: Yes,  with pt and with pt's mother.  Have you used any form of tobacco in the last 30 days? (Cigarettes, Smokeless Tobacco, Cigars, and/or Pipes): Yes  Has patient been referred to the Quitline?: Patient refused referral  Patient has been referred for addiction treatment: Yes  Georga Kaufmann, MSW, LCSWA 01/30/2017, 1:23 PM

## 2017-01-30 NOTE — Progress Notes (Signed)
  [facility] Adult Case Management Discharge Plan :  Will you be returning to the same living situation after discharge:  Yes,  Pt returning to her apartment At discharge, do you have transportation home?: Yes,  Pt family to pick up Do you have the ability to pay for your medications: Yes,  Pt provided with samples and prescriptions  Release of information consent forms completed and in the chart;  Patient's signature needed at discharge.  Patient to Follow up at: Follow-up [facility] Follow up on 02/05/2017.   Specialty:  Behavioral Health Why:  at 8:45am for medication management with Dr. Adele Schilder.  Contact information: [place] [phone]       [facility] Follow up on 02/11/2017.   Specialty:  Behavioral Health Why:  at 2:00pm for your initial assessment for therapy. Please arrive at 1:30pm to complete new patient paperwork.  Contact information: [place], [address] [address] [place] Follow up on 02/03/2017.   Specialty:  Behavioral Health Why:  Assessment appointment for the Intensive Outpatient Program 11/6 at 8:30am. Please call the office if you need to cancel or reschedule your appointment. Thank you! Contact information: [place] [id number] [place] [phone]          Next level of care provider has access to [place] and Suicide Prevention discussed: Yes,  with mother; see SPE note  Have you used any form of tobacco in the last 30 days? (Cigarettes, Smokeless Tobacco, Cigars, and/or Pipes): Yes  Has patient been referred to the Quitline?: Patient refused referral  Patient has been referred for addiction treatment: Yes  Gladstone Lighter, LCSW 01/30/2017, 9:19 AM
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
## 2017-01-30 NOTE — Progress Notes (Signed)
Pt discharged home with a colleague . Pt was ambulatory, stable and appreciative at that time. All papers and prescriptions were given and valuables returned. Verbal understanding expressed. Denies SI/HI and A/VH. Pt given opportunity to express concerns and ask questions.

## 2017-01-30 NOTE — BHH Suicide Risk Assessment (Signed)
[facility] Discharge Suicide Risk Assessment   Principal Problem: Bipolar Disorder  Discharge Diagnoses:  Patient Active Problem List   Diagnosis Date Noted  . Intentional overdose of drug in tablet form ([place]) [T50.902A] 01/26/2017  . Bipolar 1 disorder, depressed, severe ([place]) [F31.4] 01/26/2017  . Bipolar 2 disorder ([place]) [F31.81] 05/16/2014  . Internal hemorrhoids with complication [W73.7] 10/62/6948  . Vasomotor rhinitis [J30.0] 04/15/2013  . Gastritis without bleeding [K29.70] 03/10/2013  . Anal fissure [K60.2] 03/10/2013  . Obsessive compulsive disorder [F42.9] 03/07/2013  . Fibromyalgia [M79.7] 02/06/2013  . Detachment of glenoid labrum [S43.80XA] 01/13/2013  . Difficulty in walking(719.7) [R26.2] 02/18/2012  . Seasonal affective disorder ([place]) [F33.8] 11/25/2011  . Asthma, intrinsic [N46.270] 08/28/2010  . ATTENTION DEFICIT DISORDER, ADULT [F98.8] 01/19/2008  . RHINITIS MEDICAMENTOSA [J31.0] 12/08/2007  . ALLERGIC RHINITIS [J30.9] 12/08/2007  . IBS [K58.9] 12/08/2007  . ECZEMA [L25.9] 12/08/2007  . 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 Migraine headache [G43.909] 04/24/2007  . Essential hypertension [I10] 04/24/2007  . ASVD [I70.90] 04/24/2007  . GERD [K21.9] 04/24/2007  . VSD [Q21.0] 04/24/2007  . 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 rate 16, height 5\' 5"  (1.651 m), weight 89.8 kg (198 lb).Body mass index is 32.95 kg/m.  General Appearance: Well Groomed  Engineer, water::  Good  Speech:  Normal Rate  Volume:  Normal  Mood:  improved and today presents euthymic  Affect:  Appropriate and Full Range  Thought  Process:  Linear and Descriptions of Associations: Intact  Orientation:  Full (Time, Place, and Person)  Thought Content:  denies hallucinations, no delusions, not internally preoccupied   Suicidal Thoughts:  No denies suicidal or self injurious ideations, denies violent or homicidal ideations  Homicidal Thoughts:  No  Memory:  recent and remote grossly intact   Judgement:  Other:  improving   Insight:  improving   Psychomotor Activity:  Normal  Concentration:  Good  Recall:  Good  Fund of Knowledge:Good  Language: Good  Akathisia:  Negative  Handed:  Right  AIMS (if indicated):   No abnormal or involuntary movements noted or reported   Assets:  Communication Skills Desire for Improvement Resilience  Sleep:  Number of Hours: 6.75  Cognition: WNL  ADL's:  Intact   Mental Status Per Nursing Assessment::   On Admission:  Belief that plan would result in death, Self-harm behaviors  Demographic Factors:  31 year old single female, has a 78 year old daughter who lives with patient's sister . 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 Presents alert, attentive, calm, currently euthymic, affect is appropriate and reactive, speech normal, no thought disorder, no suicidal ideations, no homicidal ideations, no psychotic symptoms, future oriented. Denies  medication side effects. Side effects reviewed . Behavior on unit in good control, pleasant on approach .    Cognitive Features That Contribute To  Risk:  No gross cognitive deficits noted upon discharge. Is alert , attentive, and oriented x 3   Suicide Risk:  Mild:  Suicidal ideation of limited frequency, intensity, duration, and specificity.  There are no identifiable plans, no associated intent, mild dysphoria and related symptoms, good self-control (both objective and subjective assessment), few other risk factors, and identifiable protective factors, including available and accessible social support.  Follow-up [facility] Follow up on 02/05/2017.   Specialty:  Behavioral Health Why:  at 8:45am for medication management with Dr. Adele Schilder.  Contact information: [place] [phone]       [facility] Follow up on 02/11/2017.   Specialty:  Behavioral Health Why:  at 2:00pm for your initial assessment for therapy. Please arrive at 1:30pm to complete new patient paperwork.  Contact information: [place], [address] [place] [address] [place] Follow up on 02/03/2017.   Specialty:  Behavioral Health Why:  Assessment appointment for the Intensive Outpatient Program 11/6 at 8:30am. Please call the office if you need to cancel or reschedule your appointment. Thank you! Contact information: [place] [phone] [place] [phone]          Plan Of Care/Follow-up recommendations:  Activity:  as tolerated Diet:  regular Tests:  NA Other:  see below  Patient is leaving unit in good spirits  Plans to return home  Follow up as above   Jenne Campus, MD 01/30/2017, 10:00 AM

## 2017-02-02 ENCOUNTER — Other Ambulatory Visit: Payer: Self-pay | Admitting: Family Medicine

## 2017-02-02 ENCOUNTER — Telehealth (HOSPITAL_COMMUNITY): Payer: Self-pay

## 2017-02-02 NOTE — Telephone Encounter (Signed)
Patient is calling because she was just released from [facility] and they started her on Abilify. Patient says it is making her very irritable and she is getting cramps in her legs. She sees you on 11/8 and would like to know if she should stop the Abilify. Please review and advise, thank you

## 2017-02-02 NOTE — Telephone Encounter (Signed)
Usually medicine takes week or 10 days to get adjusted to the body.  If she cannot tolerate Abilify then she can stop.

## 2017-02-02 NOTE — Telephone Encounter (Signed)
Spoke to patient and she will continue the medication until she sees the doctor on Wednesday.

## 2017-02-03 NOTE — Telephone Encounter (Signed)
D:  Pt was scheduled to start MH-IOP today, but no showed.  Placed call to pt to check on her.  According to pt, she didn't receive an application for assistance form before being discharged from inpt unit.  States she will get an application whenever she comes in to see Dr. Adele Schilder 02-05-17 @ 8:45 a.m.Marland Kitchen  Pt voiced that she is feeling very agitated with the Abilify.  According to pt, she called and discussed this with Regan, CMA yesterday.  Pt denied SI/HI or A/V hallucinations.  A:  Provided pt with support.  Re-directed pt to Dr. Adele Schilder.  Will inform Dr. Adele Schilder of conversation.  Strongly encouraged pt not to miss her appt on 02-05-17.  R:  Pt receptive.  Dellia Nims, M.Ed, CNA

## 2017-02-05 ENCOUNTER — Encounter (HOSPITAL_COMMUNITY): Payer: Self-pay | Admitting: Psychiatry

## 2017-02-05 ENCOUNTER — Ambulatory Visit (HOSPITAL_COMMUNITY): Payer: Self-pay | Admitting: Psychiatry

## 2017-02-05 VITALS — BP 140/70 | HR 60 | Resp 12 | Ht 65.0 in | Wt 203.2 lb

## 2017-02-05 DIAGNOSIS — R450 Nervousness: Secondary | ICD-10-CM

## 2017-02-05 DIAGNOSIS — F17210 Nicotine dependence, cigarettes, uncomplicated: Secondary | ICD-10-CM

## 2017-02-05 DIAGNOSIS — S61519A Laceration without foreign body of unspecified wrist, initial encounter: Secondary | ICD-10-CM

## 2017-02-05 DIAGNOSIS — Z818 Family history of other mental and behavioral disorders: Secondary | ICD-10-CM

## 2017-02-05 DIAGNOSIS — T1491XA Suicide attempt, initial encounter: Secondary | ICD-10-CM

## 2017-02-05 DIAGNOSIS — T426X2A Poisoning by other antiepileptic and sedative-hypnotic drugs, intentional self-harm, initial encounter: Secondary | ICD-10-CM

## 2017-02-05 DIAGNOSIS — F419 Anxiety disorder, unspecified: Secondary | ICD-10-CM

## 2017-02-05 DIAGNOSIS — X788XXA Intentional self-harm by other sharp object, initial encounter: Secondary | ICD-10-CM

## 2017-02-05 DIAGNOSIS — Z811 Family history of alcohol abuse and dependence: Secondary | ICD-10-CM

## 2017-02-05 DIAGNOSIS — F3131 Bipolar disorder, current episode depressed, mild: Secondary | ICD-10-CM

## 2017-02-05 DIAGNOSIS — Z813 Family history of other psychoactive substance abuse and dependence: Secondary | ICD-10-CM

## 2017-02-05 DIAGNOSIS — Z810 Family history of intellectual disabilities: Secondary | ICD-10-CM

## 2017-02-05 MED ORDER — LAMOTRIGINE 200 MG PO TABS: 200.0000 mg | ORAL_TABLET | Freq: Every day | ORAL | 0 refills | Status: DC

## 2017-02-05 MED ORDER — FLUOXETINE HCL 40 MG PO CAPS: 40.0000 mg | ORAL_CAPSULE | Freq: Every day | ORAL | 0 refills | Status: DC

## 2017-02-05 MED ORDER — ARIPIPRAZOLE 5 MG PO TABS: 5.0000 mg | ORAL_TABLET | Freq: Every day | ORAL | 0 refills | Status: DC

## 2017-02-05 MED ORDER — HYDROXYZINE HCL 25 MG PO TABS: 25.0000 mg | ORAL_TABLET | Freq: Three times a day (TID) | ORAL | 0 refills | Status: DC

## 2017-02-05 NOTE — Progress Notes (Signed)
BH MD/PA/NP OP Progress Note  02/05/2017 8:42 AM Kristin Stephens  MRN:  [id number]  Chief Complaint: I was admitted in the hospital because of suicidal attempt.  I am doing better.  I was under a lot of stress because of family situation.  HPI: Kristin Stephens came for her follow-up appointment.  She was admitted to behavioral [facility] in October 2018 due to intentionally ingested excess amount of gabapentin and Lamictal and then when she woke up and realized her suicidal attempt failed she tried to cut her wrist with razor.  Patient told she took 6 tablets of gabapentin and Lamictal.  She admitted that she was under a lot of stress.  She admitted that she was not taking her Lamictal as she supposed to.  She was overwhelmed because of holidays.  Her uncle and grandmother died last year and she was missing them.  However her major issue was not able to see her 90-year-old daughter Minna Merritts.  Patient has given up the custody 2 years ago to her brother and sister who lives in [place].  Patient recently lost her job and only working part-time and her sister told that she cannot visit the daughter due to part-time job.  Patient got very discouraged and hopeless.  However in the hospital she realized that she did a stupid mistake because she wants to live for her daughter.  After the discharge she went to stay with her mother who lives in [place].  She talked to her sister and they agreed to do family therapy.  Her sister also agreed to go for NAMI.  She is actively looking for a full-time job.  Her medicines changed.  She is no longer taking Adderall and Ativan.  She was started on Abilify and given Vistaril to help with anxiety.  She is seeing much better.  Her sleep is improved.  She still feels some time overwhelmed because of family situation but she is hoping once family start family therapy it will get better.  She also wants to do [facility] and intensive outpatient program.  She denies any  paranoia, hallucination, active suicidal thoughts or homicidal thoughts.  She is even thinking to move to [place] to live close by to her daughter.  Her energy level is improved from the past.  She denies any crying spells or any feeling of hopelessness or worthlessness.  She denies any mania, self abusive behavior or any psychosis.  She has no shakes, tremors or any EPS.  She is not drinking or using any illegal substances.  She lives by herself.  She continues to do face time every day with her daughter.   Visit Diagnosis:    ICD-10-CM   1. Bipolar affective disorder, currently depressed, mild (HCC) F31.31 lamoTRIgine (LAMICTAL) 200 MG tablet    hydrOXYzine (ATARAX/VISTARIL) 25 MG tablet    ARIPiprazole (ABILIFY) 5 MG tablet    FLUoxetine (PROZAC) 40 MG capsule    Past Psychiatric History: Reviewed. Patient has history of mood swing, depression, anger and she was diagnosed with bipolar and ADD. She has a history of self abusive behavior by cutting her wrist with a razor. In the past she had tried Wellbutrin but limited response. Patient finished intensive outpatient program in 2016.  Shee was admitted to behavioral [facility] in October 2018 after taking overdose on medication and then slashing her wrist.  Her Adderall, Ativan and gabapentin was discontinued.      Past Medical History:  Past Medical History:  Diagnosis  Date  . ADHD (attention deficit hyperactivity disorder)   . Anxiety   . Asthma    daily and prn inhalers  . Bipolar affective disorder ([place])   . Dental crowns present   . Depression   . Deviated nasal septum 05/2012  . Eczema    right leg  . Esophageal reflux   . Fibromyalgia   . Hypertension   . IBS (irritable bowel syndrome)   . Migraine headache   . Nasal turbinate hypertrophy 05/2012   bilateral  . OCD (obsessive compulsive disorder)   . TMJ syndrome     Past Surgical History:  Procedure Laterality Date  . ASD AND VSD REPAIR  1989  . CARDIAC  CATHETERIZATION  06/18/2006  . CESAREAN SECTION  04/12/2009  . COARCTATION OF AORTA REPAIR  1989  . ESOPHAGOGASTRODUODENOSCOPY  04/04/2008     Normal esophagus/Mild patchy erythema in the antrum/ Normal duodenal bulb, normal small bowel biopsy  . FLEXIBLE SIGMOIDOSCOPY  04/04/2008     Small internal hemorrhoids (poor bowel prep)    Family Psychiatric History: Reviewed.  Family History:  Family History  Problem Relation Age of Onset  . Hyperlipidemia Mother   . Hypertension Mother   . Depression Mother   . Physical abuse Mother   . Sexual abuse Mother        possibly  . Sleep apnea Mother   . Emphysema Paternal Grandmother   . Heart disease Paternal Grandmother   . Bipolar disorder Paternal Grandmother   . Dementia Paternal Grandmother   . Heart disease Maternal Grandmother   . Dementia Maternal Grandmother   . Stroke Maternal Grandfather   . Prostate cancer Maternal Grandfather   . Personality disorder Father   . Bipolar disorder Father   . Alcohol abuse Father   . ADD / ADHD Sister   . Anxiety disorder Brother   . ADD / ADHD Brother   . ADD / ADHD Brother   . Seizures Daughter   . Drug abuse Neg Hx   . OCD Neg Hx   . Paranoid behavior Neg Hx   . Schizophrenia Neg Hx     Social History:  Social History   Socioeconomic History  . Marital status: Single    Spouse name: None  . Number of children: 1  . Years of education: GED  . Highest education level: None  Social Needs  . Financial resource strain: None  . Food insecurity - worry: None  . Food insecurity - inability: None  . Transportation needs - medical: None  . Transportation needs - non-medical: None  Occupational History  . Occupation: [employer]  Tobacco Use  . Smoking status: Current Every Day Smoker    Packs/day: 0.50    Years: 10.00    Pack years: 5.00    Types: Cigarettes    Start date: 02/27/2002  . Smokeless tobacco: Never Used  . Tobacco comment: quit smoking in 2010 during pregnacy then  started back Feb. 2011   Substance and Sexual Activity  . 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 Drug use: No    Comment: marijuana and pain pills; 12/08/07-smoked x 1 month ago- this was the first time in 2.years.  Morphine Sulfate and oxycontin - none since age 84  . Sexual activity: Yes    Birth control/protection: Injection, Spermicide  Other Topics Concern  . None  Social History Narrative   Lives  with 3 roommates   LIKES TO WATCH DR. PIMPLE POPPER. AT AGE 21 LIKED TO PICK HER NOSE AND EAT THE DRIED MUCOUS.    Caffeine: coffee, soda, occasional Red Bull    Allergies:  Allergies  Allergen Reactions  . Sulfonamide Derivatives Swelling    SWELLING OF EYES WITH OPHTHALMIC SULFA  . Coconut Flavor Rash    Metabolic Disorder Labs: Lab Results  Component Value Date   HGBA1C 5.0 07/17/2014   MPG 97 08/28/2011   No results found for: PROLACTIN Lab Results  Component Value Date   CHOL  02/06/2007    139        ATP III CLASSIFICATION:  <200     mg/dL   Desirable  200-239  mg/dL   Borderline High  >=240    mg/dL   High   TRIG 72 02/06/2007   HDL 33 (L) 02/06/2007   CHOLHDL 4.2 02/06/2007   VLDL 14 02/06/2007   LDLCALC  02/06/2007    92        Total Cholesterol/HDL:CHD Risk Coronary Heart Disease Risk Table                     Men   Women  1/2 Average Risk   3.4   3.3   Lab Results  Component Value Date   TSH 1.299 03/31/2008   TSH 1.424 Test methodology is 3rd generation TSH 02/05/2007    Therapeutic Level Labs: No results found for: LITHIUM No results found for: VALPROATE No components found for:  CBMZ  Current Medications: Current Outpatient Medications  Medication Sig Dispense Refill  . albuterol (PROVENTIL HFA;VENTOLIN HFA) 108 (90 Base) MCG/ACT inhaler Inhale 1-2 puffs into the lungs every 6 (six) hours as needed for wheezing or shortness of breath.    . ARIPiprazole (ABILIFY) 5 MG tablet Take 1  tablet (5 mg total) by mouth daily. 30 tablet 0  . FLUoxetine (PROZAC) 40 MG capsule Take 1 capsule (40 mg total) by mouth daily. 30 capsule 0  . fluticasone (FLONASE) 50 MCG/ACT nasal spray USE 2 SPRAYS IN EACH NOSTRIL EVERY DAY 16 g 5  . hydrOXYzine (ATARAX/VISTARIL) 25 MG tablet Take 1 tablet (25 mg total) by mouth 3 (three) times daily. 30 tablet 0  . lamoTRIgine (LAMICTAL) 200 MG tablet Take 1 tablet (200 mg total) by mouth at bedtime. 30 tablet 0  . nicotine (NICODERM CQ - DOSED IN MG/24 HOURS) 21 mg/24hr patch Place 1 patch (21 mg total) onto the skin daily. 28 patch 0  . traZODone (DESYREL) 50 MG tablet Take 1 tablet (50 mg total) by mouth at bedtime as needed for sleep. 30 tablet 0   No current facility-administered medications for this visit.      Musculoskeletal: Strength & Muscle Tone: within normal limits Gait & Station: normal Patient leans: N/A  Psychiatric Specialty Exam: Review of Systems  Constitutional: Negative for weight loss.  HENT: Negative.   Respiratory: Negative.   Cardiovascular: Negative.   Skin: Negative.  Negative for itching and rash.  Neurological: Negative.   Psychiatric/Behavioral: The patient is nervous/anxious.     Blood pressure 140/70, pulse 60, resp. rate 12, height 5\' 5"  (1.651 m), weight 203 lb 3.2 oz (92.2 kg).Body mass index is 33.81 kg/m.  General Appearance: Casual  Eye Contact:  Good  Speech:  Clear and Coherent  Volume:  Normal  Mood:  Anxious  Affect:  Constricted  Thought Process:  Goal Directed  Orientation:  Full (Time, Place,  and Person)  Thought Content: Logical and Rumination   Suicidal Thoughts:  No  Homicidal Thoughts:  No  Memory:  Immediate;   Good Recent;   Good Remote;   Good  Judgement:  Good  Insight:  Good  Psychomotor Activity:  Normal  Concentration:  Concentration: Fair and Attention Span: Fair  Recall:  Good  Fund of Knowledge: Good  Language: Good  Akathisia:  No  Handed:  Right  AIMS (if  indicated): not done  Assets:  Communication Skills Desire for Improvement Housing Social Support  ADL's:  Intact  Cognition: WNL  Sleep:  Good   Screenings: AIMS     Admission (Discharged) from 01/26/2017 in [facility]  AIMS Total Score  0    AUDIT     Admission (Discharged) from 01/26/2017 in [facility]  Alcohol Use Disorder Identification Test Final Score (AUDIT)  0    Mini-Mental     Office Visit from 07/31/2016 in [facility]  Total Score (max 30 points )  29       Assessment and Plan: Bipolar disorder type I.  I review records from the hospital including discharge summary, current medication, recent blood work results.  Her hemoglobin A1c is normal.  Her Lamictal level is 7.3.  Patient doing much better on Abilify.  She is no longer taking Ativan and Adderall.  We talked about family issues.  Patient is pleased that sister agree to consider family therapy and also sister and mother will do NAMI.  Patient is also thinking about moving to [place] in the future to live close to her daughter.  She will start intensive outpatient program and also wants to do [facility].  At this time she does not have any suicidal thoughts.  She is more optimistic about the future and regret about her hospitalization.  She wants to see her daughter grownup.  I will continue Prozac 40 mg daily, Lamictal 200 mg daily, Abilify 5 mg daily and Vistaril 25 mg as needed for anxiety.  Discussed in length about medication, prognosis and side effects.  Discussed metabolic syndrome from Abilify.  Discussed safety concerns at any time having active suicidal thoughts or homicidal thought that she need to call 911 or go to local emergency room.  Follow-up in 4 weeks.   Aerik Polan T., MD 02/05/2017, 8:42 AM

## 2017-02-11 ENCOUNTER — Telehealth (HOSPITAL_COMMUNITY): Payer: Self-pay | Admitting: Psychiatry

## 2017-02-11 NOTE — Telephone Encounter (Signed)
02/11/17 1:21pm Su Hilt from [facility]  #[phone] called to f/w on the appt 02/05/17 to make sure the patient keep the appt.Marland KitchenMariana Kaufman

## 2017-02-16 ENCOUNTER — Other Ambulatory Visit (HOSPITAL_COMMUNITY): Payer: Self-pay | Admitting: Psychiatry

## 2017-02-16 ENCOUNTER — Telehealth (HOSPITAL_COMMUNITY): Payer: Self-pay

## 2017-02-16 NOTE — Telephone Encounter (Signed)
Patient came by today, she is not doing well without her Adderall, she said that they took her off in the hospital, but she thought it would be renewed when she came to see you. She goes back to work this week and is not feeling like she can function without the Adderall. Please review and advise, thank you

## 2017-02-16 NOTE — Telephone Encounter (Signed)
Spoke to patient and she agreed to talk to you at next appointment.

## 2017-02-16 NOTE — Telephone Encounter (Signed)
She is taking too many medication.  We will defer adding any stimulant at this time.  However we will discussed this issue on her next appointment.

## 2017-02-20 ENCOUNTER — Encounter (HOSPITAL_COMMUNITY): Payer: Self-pay | Admitting: Emergency Medicine

## 2017-02-20 ENCOUNTER — Other Ambulatory Visit: Payer: Self-pay

## 2017-02-20 ENCOUNTER — Emergency Department (HOSPITAL_COMMUNITY)
Admission: EM | Admit: 2017-02-20 | Discharge: 2017-02-20 | Disposition: A | Discharge status: Home / Self Care | Payer: Medicaid Other | Attending: Emergency Medicine | Admitting: Emergency Medicine

## 2017-02-20 DIAGNOSIS — I10 Essential (primary) hypertension: Secondary | ICD-10-CM | POA: Insufficient documentation

## 2017-02-20 DIAGNOSIS — J45909 Unspecified asthma, uncomplicated: Secondary | ICD-10-CM | POA: Insufficient documentation

## 2017-02-20 DIAGNOSIS — R112 Nausea with vomiting, unspecified: Secondary | ICD-10-CM

## 2017-02-20 DIAGNOSIS — F17210 Nicotine dependence, cigarettes, uncomplicated: Secondary | ICD-10-CM | POA: Insufficient documentation

## 2017-02-20 DIAGNOSIS — F909 Attention-deficit hyperactivity disorder, unspecified type: Secondary | ICD-10-CM | POA: Insufficient documentation

## 2017-02-20 DIAGNOSIS — Z79899 Other long term (current) drug therapy: Secondary | ICD-10-CM | POA: Insufficient documentation

## 2017-02-20 LAB — COMPREHENSIVE METABOLIC PANEL
ALBUMIN: 4.6 g/dL (ref 3.5–5.0)
ALK PHOS: 85 U/L (ref 38–126)
ALT: 25 U/L (ref 14–54)
AST: 23 U/L (ref 15–41)
Anion gap: 9 (ref 5–15)
BILIRUBIN TOTAL: 0.9 mg/dL (ref 0.3–1.2)
BUN: 10 mg/dL (ref 6–20)
CALCIUM: 9.3 mg/dL (ref 8.9–10.3)
CO2: 24 mmol/L (ref 22–32)
Chloride: 106 mmol/L (ref 101–111)
Creatinine, Ser: 0.66 mg/dL (ref 0.44–1.00)
GFR calc Af Amer: >60 mL/min (ref >60)
GLUCOSE: 113 mg/dL — AB (ref 65–99)
Potassium: 3.5 mmol/L (ref 3.5–5.1)
Sodium: 139 mmol/L (ref 135–145)
TOTAL PROTEIN: 7.7 g/dL (ref 6.5–8.1)

## 2017-02-20 LAB — CBC
HCT: 42 % (ref 36.0–46.0)
Hemoglobin: 14.4 g/dL (ref 12.0–15.0)
MCH: 30.6 pg (ref 26.0–34.0)
MCHC: 34.3 g/dL (ref 30.0–36.0)
MCV: 89.4 fL (ref 78.0–100.0)
PLATELETS: 309 10*3/uL (ref 150–400)
RBC: 4.7 MIL/uL (ref 3.87–5.11)
RDW: 13.2 % (ref 11.5–15.5)
WBC: 8.6 10*3/uL (ref 4.0–10.5)

## 2017-02-20 LAB — I-STAT BETA HCG BLOOD, ED (MC, WL, AP ONLY)

## 2017-02-20 LAB — SALICYLATE LEVEL: Salicylate Lvl: <7 mg/dL (ref 2.8–30.0)

## 2017-02-20 LAB — ACETAMINOPHEN LEVEL: Acetaminophen (Tylenol), Serum: <10 ug/mL — ABNORMAL LOW (ref 10–30)

## 2017-02-20 LAB — LIPASE, BLOOD: Lipase: 21 U/L (ref 11–51)

## 2017-02-20 MED ORDER — FAMOTIDINE IN NACL 20-0.9 MG/50ML-% IV SOLN
20.0000 mg | Freq: Once | INTRAVENOUS | Status: AC
  Administered 2017-02-20: 20 mg via INTRAVENOUS
  Filled 2017-02-20: qty 50

## 2017-02-20 MED ORDER — SODIUM CHLORIDE 0.9 % IV BOLUS (SEPSIS)
1000.0000 mL | Freq: Once | INTRAVENOUS | Status: AC
  Administered 2017-02-20: 1000 mL via INTRAVENOUS

## 2017-02-20 MED ORDER — ONDANSETRON HCL 4 MG/2ML IJ SOLN
4.0000 mg | Freq: Once | INTRAMUSCULAR | Status: AC
  Administered 2017-02-20: 4 mg via INTRAVENOUS
  Filled 2017-02-20: qty 2

## 2017-02-20 MED ORDER — DICYCLOMINE HCL 10 MG PO CAPS
10.0000 mg | ORAL_CAPSULE | Freq: Once | ORAL | Status: AC
  Administered 2017-02-20: 10 mg via ORAL
  Filled 2017-02-20: qty 1

## 2017-02-20 MED ORDER — ONDANSETRON HCL 4 MG PO TABS: 4.0000 mg | ORAL_TABLET | Freq: Three times a day (TID) | ORAL | 0 refills | Status: DC | PRN

## 2017-02-20 NOTE — ED Triage Notes (Signed)
Pt complaint of abdominal pain with n/v/d since 0500.

## 2017-02-20 NOTE — Discharge Instructions (Signed)

## 2017-02-20 NOTE — ED Notes (Signed)
Able to keep down Ginger Ale

## 2017-02-20 NOTE — ED Provider Notes (Signed)
[facility] DEPT Provider Note   CSN: [id number] Arrival date & time: 02/20/17  0539     History   Chief Complaint Chief Complaint  Patient presents with  . Abdominal Pain    HPI  Blood pressure (!) 142/80, pulse 83, temperature 98 F (36.7 C), temperature source Oral, resp. rate 16, height 5\' 5"  (1.651 m), weight 90.7 kg (200 lb), SpO2 100 %.  Kristin Stephens is a 31 y.o. female with past medical history significant for bipolar, anxiety, IBS, cortication of the aorta planing of multiple episodes of nonbloody, nonbilious, non-coffee-ground emesis occurring last night at around midnight.  She has no associated diarrhea, she has some epigastric discomfort occurring after the emesis started multiple times.  She denies fever but endorses feeling hot and cold, she rates her pain as moderate, 4 out of 10 and exacerbated just before vomiting.  She denies dysuria, hematuria, urinary frequency or flank pain.  She has no other sick contacts, she had Thanksgiving dinner yesterday with her roommate who did not have any similar symptoms.  Recently admitted for suicide attempt with overdose, she denies any suicidal ideation, overdose, homicidal ideation, auditory or visual hallucinations.  Past Medical History:  Diagnosis Date  . ADHD (attention deficit hyperactivity disorder)   . Anxiety   . Asthma    daily and prn inhalers  . Bipolar affective disorder ([place])   . Dental crowns present   . Depression   . Deviated nasal septum 05/2012  . Eczema    right leg  . Esophageal reflux   . Fibromyalgia   . Hypertension   . IBS (irritable bowel syndrome)   . Migraine headache   . Nasal turbinate hypertrophy 05/2012   bilateral  . OCD (obsessive compulsive disorder)   . TMJ syndrome     Patient Active Problem List   Diagnosis Date Noted  . Intentional overdose of drug in tablet form ([place]) 01/26/2017  . Bipolar 1 disorder, depressed, severe ([place]) 01/26/2017  .  Bipolar 2 disorder ([place]) 05/16/2014  . Internal hemorrhoids with complication 76/73/4193  . Vasomotor rhinitis 04/15/2013  . Gastritis without bleeding 03/10/2013  . Anal fissure 03/10/2013  . Obsessive compulsive disorder 03/07/2013  . Fibromyalgia 02/06/2013  . Detachment of glenoid labrum 01/13/2013  . Difficulty in walking(719.7) 02/18/2012  . Seasonal affective disorder ([place]) 11/25/2011  . Asthma, intrinsic 08/28/2010  . ATTENTION DEFICIT DISORDER, ADULT 01/19/2008  . RHINITIS MEDICAMENTOSA 12/08/2007  . ALLERGIC RHINITIS 12/08/2007  . IBS 12/08/2007  . ECZEMA 12/08/2007  . 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 Migraine headache 04/24/2007  . Essential hypertension 04/24/2007  . ASVD 04/24/2007  . GERD 04/24/2007  . VSD 04/24/2007  . 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 ASD AND VSD REPAIR  1989  . CARDIAC CATHETERIZATION  06/18/2006  . CESAREAN SECTION  04/12/2009  . COARCTATION OF AORTA REPAIR  1989  . COLONOSCOPY N/A 05/20/2013   Procedure: COLONOSCOPY;  Surgeon: Danie Binder, MD;  Location[facility];  Service: Endoscopy;  Laterality: N/A;  115-moved to [place] notified pt  . ESOPHAGOGASTRODUODENOSCOPY  04/04/2008     Normal esophagus/Mild patchy erythema in the antrum/ Normal duodenal bulb, normal small bowel biopsy  . FLEXIBLE SIGMOIDOSCOPY  04/04/2008     Small internal hemorrhoids (poor bowel prep)  . HEMORRHOID BANDING N/A 05/20/2013   Procedure: HEMORRHOID BANDING;  Surgeon: Danie Binder, MD;  Location: [facility];  Service: Endoscopy;  Laterality: N/A;  . NASAL SEPTOPLASTY W/ TURBINOPLASTY Bilateral 05/31/2012   Procedure: NASAL SEPTOPLASTY WITH TURBINATE REDUCTION;  Surgeon: Ascencion Dike, MD;  Location: [facility];  Service: ENT;  Laterality: Bilateral;    OB History    Gravida Para Term Preterm AB Living   1 1 1     1    SAB TAB Ectopic Multiple Live Births                    Home Medications    Prior to Admission medications   Medication Sig Start Date End Date Taking? Authorizing Provider  acetaminophen (TYLENOL) 500 MG tablet Take 1,000-2,000 mg by mouth every 6 (six) hours as needed for mild pain, moderate pain, fever or headache.   Yes [provider]  albuterol (PROVENTIL HFA;VENTOLIN HFA) 108 (90 Base) MCG/ACT inhaler Inhale 1-2 puffs into the lungs every 6 (six) hours as needed for wheezing or shortness of breath.   Yes [provider]  ARIPiprazole (ABILIFY) 5 MG tablet Take 1 tablet (5 mg total) daily by mouth. 02/05/17  Yes Arfeen, Arlyce Harman, MD  FLUoxetine (PROZAC) 40 MG capsule Take 1 capsule (40 mg total) daily by mouth. 02/05/17  Yes Arfeen, Arlyce Harman, MD  fluticasone (FLONASE) 50 MCG/ACT nasal spray USE 2 SPRAYS IN EACH NOSTRIL EVERY DAY Patient taking differently: USE 2 SPRAYS IN EACH NOSTRIL EVERY DAY as needed for allergies 02/02/17  Yes Mikey Kirschner, MD  hydrOXYzine (ATARAX/VISTARIL) 25 MG tablet Take 1 tablet (25 mg total) 3 (three) times daily by mouth. Patient taking differently: Take 25 mg by mouth every 8 (eight) hours as needed for anxiety.  02/05/17  Yes Arfeen, Arlyce Harman, MD  lamoTRIgine (LAMICTAL) 100 MG tablet Take 200 mg by mouth at bedtime.   Yes [provider]  medroxyPROGESTERone (DEPO-PROVERA) 150 MG/ML injection Inject 150 mg into the muscle every 3 (three) months.   Yes [provider]  traZODone (DESYREL) 50 MG tablet Take 1 tablet (50 mg total) by mouth at bedtime as needed for sleep. 01/30/17  Yes Derrill Center, NP  lamoTRIgine (LAMICTAL) 200 MG tablet Take 1 tablet (200 mg total) at bedtime by mouth. Patient not taking: Reported on 02/20/2017 02/05/17   Arfeen, Arlyce Harman, MD  nicotine (NICODERM CQ - DOSED IN MG/24 HOURS) 21 mg/24hr patch Place 1 patch (21 mg total) onto the skin daily. Patient not taking: Reported on 02/20/2017 01/31/17   Derrill Center, NP  ondansetron (ZOFRAN) 4 MG tablet Take  1 tablet (4 mg total) by mouth every 8 (eight) hours as needed for nausea or vomiting. 02/20/17   Ketrick Matney, Elmyra Ricks, PA-C    Family History Family History  Problem Relation Age of Onset  . Hyperlipidemia Mother   . Hypertension Mother   . Depression Mother   . Physical abuse Mother   . Sexual abuse Mother        possibly  . Sleep apnea Mother   . Emphysema Paternal Grandmother   . Heart disease Paternal Grandmother   . Bipolar disorder Paternal Grandmother   . Dementia Paternal Grandmother   . Heart disease Maternal Grandmother   . Dementia Maternal Grandmother   . Stroke Maternal Grandfather   . Prostate cancer Maternal Grandfather   . Personality disorder Father   . Bipolar disorder Father   . Alcohol abuse Father   . ADD / ADHD Sister   . Anxiety disorder Brother   .  ADD / ADHD Brother   . ADD / ADHD Brother   . Seizures Daughter   . Drug abuse Neg Hx   . OCD Neg Hx   . Paranoid behavior Neg Hx   . Schizophrenia Neg Hx     Social History Social History   Tobacco Use  . Smoking status: Current Every Day Smoker    Packs/day: 0.50    Years: 10.00    Pack years: 5.00    Types: Cigarettes    Start date: 02/27/2002  . Smokeless tobacco: Never Used  . Tobacco comment: quit smoking in 2010 during pregnacy then started back Feb. 2011   Substance Use Topics  . 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 Drug use: No    Comment: marijuana and pain pills; 12/08/07-smoked x 1 month ago- this was the first time in 2.years.  Morphine Sulfate and oxycontin - none since age 53     Allergies   Sulfonamide derivatives and Coconut flavor   Review of Systems Review of Systems  A complete review of systems was obtained and all systems are negative except as noted in the HPI and PMH.   Physical Exam Updated Vital Signs BP 119/79 (BP Location: Right Arm)   Pulse 74   Temp 98 F (36.7 C) (Oral)   Resp 16   Ht 5'  5" (1.651 m)   Wt 90.7 kg (200 lb)   SpO2 99%   BMI 33.28 kg/m   Physical Exam  Constitutional: She is oriented to person, place, and time. She appears well-developed and well-nourished. No distress.  HENT:  Head: Normocephalic and atraumatic.  Mouth/Throat: Oropharynx is clear and moist.  Eyes: Conjunctivae and EOM are normal. Pupils are equal, round, and reactive to light.  Neck: Normal range of motion.  Cardiovascular: Normal rate, regular rhythm and intact distal pulses.  Pulmonary/Chest: Effort normal and breath sounds normal. No stridor. No respiratory distress. She has no wheezes. She has no rales. She exhibits no tenderness.  Large surgical scar to left back  Abdominal: Soft. Bowel sounds are normal. She exhibits no distension and no mass. There is no tenderness. There is no rebound and no guarding. No hernia.  No tenderness to deep palpation of any quadrant  Musculoskeletal: Normal range of motion.  Neurological: She is alert and oriented to person, place, and time.  Skin: Capillary refill takes less than 2 seconds. She is not diaphoretic.  Psychiatric: She has a normal mood and affect.  Nursing note and vitals reviewed.     ED Treatments / Results  Labs (all labs ordered are listed, but only abnormal results are displayed) Labs Reviewed  COMPREHENSIVE METABOLIC PANEL - Abnormal; Notable for the following components:      Result Value   Glucose, Bld 113 (*)    All other components within normal limits  ACETAMINOPHEN LEVEL - Abnormal; Notable for the following components:   Acetaminophen (Tylenol), Serum <10 (*)    All other components within normal limits  LIPASE, BLOOD  CBC  SALICYLATE LEVEL  URINALYSIS, ROUTINE W REFLEX MICROSCOPIC  I-STAT BETA HCG BLOOD, ED ([facility], [facility], [facility] ONLY)    EKG  EKG Interpretation None       Radiology No results found.  Procedures Procedures (including critical care time)  Medications Ordered in ED Medications  sodium  chloride 0.9 % bolus 1,000 mL (0 mLs Intravenous Stopped 02/20/17 0833)  ondansetron [facility]) injection 4 mg (4 mg Intravenous Given 02/20/17 0731)  famotidine (PEPCID) IVPB 20 mg premix (0 mg Intravenous Stopped 02/20/17 0818)  dicyclomine (BENTYL) capsule 10 mg (10 mg Oral Given 02/20/17 7619)     Initial Impression / Assessment and Plan / ED Course  I have reviewed the triage vital signs and the nursing notes.  Pertinent labs & imaging results that were available during my care of the patient were reviewed by me and considered in my medical decision making (see chart for details).     Vitals:   02/20/17 0657 02/20/17 0730 02/20/17 0945  BP: (!) 142/80 119/72 119/79  Pulse: 83 77 74  Resp: 16 16 16   Temp: 98 F (36.7 C)    TempSrc: Oral    SpO2: 100% 100% 99%  Weight: 90.7 kg (200 lb)    Height: 5\' 5"  (1.651 m)      Medications  sodium chloride 0.9 % bolus 1,000 mL (0 mLs Intravenous Stopped 02/20/17 0833)  ondansetron (ZOFRAN) injection 4 mg (4 mg Intravenous Given 02/20/17 0731)  famotidine (PEPCID) IVPB 20 mg premix (0 mg Intravenous Stopped 02/20/17 0818)  dicyclomine (BENTYL) capsule 10 mg (10 mg Oral Given 02/20/17 5093)    Kristin Stephens is 31 y.o. female presenting with episodes of emesis onset last night with some mild epigastric tenderness occurring after the emesis.  Abdominal exam is benign, vital signs reassuring.  She was recently seen for intentional drug overdose, she denies that she took anything in a attempt at suicide today however, with lack of sick contacts and no associated diarrhea or fever would consider OD.  Will check salicylate and acetaminophen level.  Salicylate and acetaminophen reassuring, no significant electrolyte abnormality.  No leukocytosis.  Repeat abdominal exam is benign, patient tolerating p.o.'s and feels much better.  Evaluation does not show pathology that would require ongoing emergent intervention or inpatient treatment. Pt is  hemodynamically stable and mentating appropriately. Discussed findings and plan with patient/guardian, who agrees with care plan. All questions answered. Return precautions discussed and outpatient follow up given.    Final Clinical Impressions(s) / ED Diagnoses   Final diagnoses:  Nausea and vomiting, intractability of vomiting not specified, unspecified vomiting type    ED Discharge Orders        Ordered    ondansetron (ZOFRAN) 4 MG tablet  Every 8 hours PRN     02/20/17 1009       Lashaun Poch, Charna Elizabeth 02/20/17 1018    Blanchie Dessert, MD 02/20/17 2205

## 2017-02-20 NOTE — ED Notes (Signed)
Pt given 7.5oz Ginger Ale, will reassess in 30 min

## 2017-03-02 ENCOUNTER — Encounter: Payer: Self-pay | Admitting: Family Medicine

## 2017-03-02 ENCOUNTER — Encounter: Payer: Self-pay | Admitting: Nurse Practitioner

## 2017-03-02 ENCOUNTER — Ambulatory Visit (INDEPENDENT_AMBULATORY_CARE_PROVIDER_SITE_OTHER): Payer: Self-pay | Admitting: Nurse Practitioner

## 2017-03-02 VITALS — BP 110/78 | Ht 65.0 in | Wt 205.8 lb

## 2017-03-02 DIAGNOSIS — M797 Fibromyalgia: Secondary | ICD-10-CM

## 2017-03-02 NOTE — Progress Notes (Signed)
Subjective: Presents for recheck after her hospitalization in behavioral health on 10/28 for suicidal behavior and intentional overdose.  Patient states she was sleeping all the time and took an overdose of gabapentin because she was tired of dealing with life.  Was hospitalized for about a week.  Doing much better now.  Medications were adjusted.  Started on low-dose Abilify.  Has been diagnosed with bipolar type I.  Sees her psychiatrist every month for now until she is more stable.  Getting regular counseling as well.  Continues to have occasional flareup of her fibromyalgia and back pain.  Denies suicidal or homicidal thoughts or ideation at this time.  Objective:   BP 110/78   Ht 5\' 5"  (1.651 m)   Wt 205 lb 12.8 oz (93.4 kg)   BMI 34.25 kg/m  NAD.  Alert, oriented.  Calm cheerful affect.  Lungs clear.  Heart regular rate and rhythm.  Thoughts logical coherent and relevant.  Dressed appropriately.  Making good eye contact.   Assessment:  Problem List Items Addressed This Visit      Other   Fibromyalgia - Primary (Chronic)        Plan: Recommend no hydrocodone or gabapentin at this time until patient is more stable.  Although she has shown improvement she continues frequent follow-up with mental health until she is more stable.  Also recommend not adding more medications to an already complicated situation.  Recommend OTCs topicals such as lidocaine patch or Biofreeze, weight loss and regular back exercises.  Do not recommend weight loss medication at this time. Recheck here as needed.

## 2017-03-02 NOTE — Patient Instructions (Signed)
Lidocaine patch Biofreeze Ice/heat   Back Exercises If you have pain in your back, do these exercises 2-3 times each day or as told by your doctor. When the pain goes away, do the exercises once each day, but repeat the steps more times for each exercise (do more repetitions). If you do not have pain in your back, do these exercises once each day or as told by your doctor. Exercises Single Knee to Chest  Do these steps 3-5 times in a row for each leg: 1. Lie on your back on a firm bed or the floor with your legs stretched out. 2. Bring one knee to your chest. 3. Hold your knee to your chest by grabbing your knee or thigh. 4. Pull on your knee until you feel a gentle stretch in your lower back. 5. Keep doing the stretch for 10-30 seconds. 6. Slowly let go of your leg and straighten it.  Pelvic Tilt  Do these steps 5-10 times in a row: 1. Lie on your back on a firm bed or the floor with your legs stretched out. 2. Bend your knees so they point up to the ceiling. Your feet should be flat on the floor. 3. Tighten your lower belly (abdomen) muscles to press your lower back against the floor. This will make your tailbone point up to the ceiling instead of pointing down to your feet or the floor. 4. Stay in this position for 5-10 seconds while you gently tighten your muscles and breathe evenly.  Cat-Cow  Do these steps until your lower back bends more easily: 1. Get on your hands and knees on a firm surface. Keep your hands under your shoulders, and keep your knees under your hips. You may put padding under your knees. 2. Let your head hang down, and make your tailbone point down to the floor so your lower back is round like the back of a cat. 3. Stay in this position for 5 seconds. 4. Slowly lift your head and make your tailbone point up to the ceiling so your back hangs low (sags) like the back of a cow. 5. Stay in this position for 5 seconds.  Press-Ups  Do these steps 5-10 times in  a row: 1. Lie on your belly (face-down) on the floor. 2. Place your hands near your head, about shoulder-width apart. 3. While you keep your back relaxed and keep your hips on the floor, slowly straighten your arms to raise the top half of your body and lift your shoulders. Do not use your back muscles. To make yourself more comfortable, you may change where you place your hands. 4. Stay in this position for 5 seconds. 5. Slowly return to lying flat on the floor.  Bridges  Do these steps 10 times in a row: 1. Lie on your back on a firm surface. 2. Bend your knees so they point up to the ceiling. Your feet should be flat on the floor. 3. Tighten your butt muscles and lift your butt off of the floor until your waist is almost as high as your knees. If you do not feel the muscles working in your butt and the back of your thighs, slide your feet 1-2 inches farther away from your butt. 4. Stay in this position for 3-5 seconds. 5. Slowly lower your butt to the floor, and let your butt muscles relax.  If this exercise is too easy, try doing it with your arms crossed over your chest. Belly Crunches  Do these steps 5-10 times in a row: 1. Lie on your back on a firm bed or the floor with your legs stretched out. 2. Bend your knees so they point up to the ceiling. Your feet should be flat on the floor. 3. Cross your arms over your chest. 4. Tip your chin a little bit toward your chest but do not bend your neck. 5. Tighten your belly muscles and slowly raise your chest just enough to lift your shoulder blades a tiny bit off of the floor. 6. Slowly lower your chest and your head to the floor.  Back Lifts Do these steps 5-10 times in a row: 1. Lie on your belly (face-down) with your arms at your sides, and rest your forehead on the floor. 2. Tighten the muscles in your legs and your butt. 3. Slowly lift your chest off of the floor while you keep your hips on the floor. Keep the back of your head in  line with the curve in your back. Look at the floor while you do this. 4. Stay in this position for 3-5 seconds. 5. Slowly lower your chest and your face to the floor.  Contact a doctor if:  Your back pain gets a lot worse when you do an exercise.  Your back pain does not lessen 2 hours after you exercise. If you have any of these problems, stop doing the exercises. Do not do them again unless your doctor says it is okay. Get help right away if:  You have sudden, very bad back pain. If this happens, stop doing the exercises. Do not do them again unless your doctor says it is okay. This information is not intended to replace advice given to you by your health care provider. Make sure you discuss any questions you have with your health care provider. Document Released: 04/19/2010 Document Revised: 08/23/2015 Document Reviewed: 05/11/2014 Elsevier Interactive Patient Education  [employer].

## 2017-03-05 ENCOUNTER — Other Ambulatory Visit (HOSPITAL_COMMUNITY): Payer: Self-pay | Admitting: Psychiatry

## 2017-03-05 ENCOUNTER — Other Ambulatory Visit (HOSPITAL_COMMUNITY): Payer: Self-pay

## 2017-03-05 DIAGNOSIS — F3131 Bipolar disorder, current episode depressed, mild: Secondary | ICD-10-CM

## 2017-03-05 MED ORDER — ARIPIPRAZOLE 5 MG PO TABS: 5.0000 mg | ORAL_TABLET | Freq: Every day | ORAL | 0 refills | Status: DC

## 2017-03-06 ENCOUNTER — Ambulatory Visit: Payer: Medicaid Other

## 2017-03-09 ENCOUNTER — Ambulatory Visit (HOSPITAL_COMMUNITY): Payer: Self-pay | Admitting: Psychiatry

## 2017-03-10 ENCOUNTER — Other Ambulatory Visit (HOSPITAL_COMMUNITY): Payer: Self-pay | Admitting: Psychiatry

## 2017-03-10 DIAGNOSIS — F3131 Bipolar disorder, current episode depressed, mild: Secondary | ICD-10-CM

## 2017-03-18 ENCOUNTER — Encounter (HOSPITAL_COMMUNITY): Payer: Self-pay | Admitting: Psychiatry

## 2017-03-18 ENCOUNTER — Ambulatory Visit (INDEPENDENT_AMBULATORY_CARE_PROVIDER_SITE_OTHER): Payer: Self-pay | Admitting: Psychiatry

## 2017-03-18 DIAGNOSIS — F3131 Bipolar disorder, current episode depressed, mild: Secondary | ICD-10-CM

## 2017-03-18 DIAGNOSIS — F17210 Nicotine dependence, cigarettes, uncomplicated: Secondary | ICD-10-CM

## 2017-03-18 MED ORDER — FLUOXETINE HCL 40 MG PO CAPS: 40.0000 mg | ORAL_CAPSULE | Freq: Every day | ORAL | 1 refills | Status: DC

## 2017-03-18 MED ORDER — ARIPIPRAZOLE 10 MG PO TABS: 10.0000 mg | ORAL_TABLET | Freq: Every day | ORAL | 1 refills | Status: DC

## 2017-03-18 MED ORDER — TRAZODONE HCL 50 MG PO TABS: 50.0000 mg | ORAL_TABLET | Freq: Every evening | ORAL | 0 refills | Status: DC | PRN

## 2017-03-18 MED ORDER — LAMOTRIGINE 200 MG PO TABS: 200.0000 mg | ORAL_TABLET | Freq: Every day | ORAL | 1 refills | Status: DC

## 2017-03-18 MED ORDER — HYDROXYZINE HCL 25 MG PO TABS: 25.0000 mg | ORAL_TABLET | Freq: Every day | ORAL | 1 refills | Status: DC | PRN

## 2017-03-18 NOTE — Progress Notes (Signed)
BH MD/PA/NP OP Progress Note  03/18/2017 1:23 PM Kristin Stephens  MRN:  [id number]  Chief Complaint: I am doing better from the past.  I still get some time emotional and having crying spells.  HPI: Patient came for her follow-up appointment.  She is taking her medication but she continued to struggle with some time crying spells and her emotions.  In the meeting she was upset because she could not see her daughter on the Christmas.  She told her daughter's father has visitation rights on the Christmas.  She is going to [place] once a week.  She is pleased that her mother started [employer] but hoping one day her sister also joined them.  She like her current medication but sometimes she feels does need to be adjusted.  She is doing part-time at [employer].  She is actively looking for a full-time so she can keep herself busy.  She continues to talk to her daughter every day and hoping to see after the Christmas.  Her energy level is fair.  She like to go back on Adderall but realized that she need to get her emotions and her mood under control.  She is pleased that she is not drinking.  She denies any suicidal thoughts or homicidal thought.  She denies any paranoia or any hallucination.  She remains emotional but she believed mostly due to her family drama.  She lives by herself.  Patient has no rash, itching, tremors or shakes.  Visit Diagnosis:    ICD-10-CM   1. Bipolar affective disorder, currently depressed, mild (HCC) F31.31 traZODone (DESYREL) 50 MG tablet    lamoTRIgine (LAMICTAL) 200 MG tablet    hydrOXYzine (ATARAX/VISTARIL) 25 MG tablet    FLUoxetine (PROZAC) 40 MG capsule    ARIPiprazole (ABILIFY) 10 MG tablet    Past Psychiatric History: Reviewed. Patient has history of mood swing, depression, anger and she was diagnosed with bipolar and ADD. She has a history of self abusive behavior by cutting her wrist with a razor. In the past she had tried Wellbutrin  but limited response. Patient finished intensive outpatient program in 2016.  Shee was admitted to behavioral [facility] in October 2018 after taking overdose on medication and then slashing her wrist.  Her Adderall, Ativan and gabapentin was discontinued.     Past Medical History:  Past Medical History:  Diagnosis Date  . ADHD (attention deficit hyperactivity disorder)   . Anxiety   . Asthma    daily and prn inhalers  . Bipolar affective disorder ([place])   . Dental crowns present   . Depression   . Deviated nasal septum 05/2012  . Eczema    right leg  . Esophageal reflux   . Fibromyalgia   . Hypertension   . IBS (irritable bowel syndrome)   . Migraine headache   . Nasal turbinate hypertrophy 05/2012   bilateral  . OCD (obsessive compulsive disorder)   . TMJ syndrome     Past Surgical History:  Procedure Laterality Date  . ASD AND VSD REPAIR  1989  . CARDIAC CATHETERIZATION  06/18/2006  . CESAREAN SECTION  04/12/2009  . COARCTATION OF AORTA REPAIR  1989  . COLONOSCOPY N/A 05/20/2013   Procedure: COLONOSCOPY;  Surgeon: Danie Binder, MD;  Location[facility];  Service: Endoscopy;  Laterality: N/A;  115-moved to [place] notified pt  . ESOPHAGOGASTRODUODENOSCOPY  04/04/2008     Normal esophagus/Mild patchy erythema in the antrum/ Normal  duodenal bulb, normal small bowel biopsy  . FLEXIBLE SIGMOIDOSCOPY  04/04/2008     Small internal hemorrhoids (poor bowel prep)  . HEMORRHOID BANDING N/A 05/20/2013   Procedure: HEMORRHOID BANDING;  Surgeon: Danie Binder, MD;  Location: [facility];  Service: Endoscopy;  Laterality: N/A;  . NASAL SEPTOPLASTY W/ TURBINOPLASTY Bilateral 05/31/2012   Procedure: NASAL SEPTOPLASTY WITH TURBINATE REDUCTION;  Surgeon: Ascencion Dike, MD;  Location: [facility];  Service: ENT;  Laterality: Bilateral;    Family Psychiatric History: Reviewed.  Family History:  Family History  Problem Relation Age of Onset  . Hyperlipidemia  Mother   . Hypertension Mother   . Depression Mother   . Physical abuse Mother   . Sexual abuse Mother        possibly  . Sleep apnea Mother   . Emphysema Paternal Grandmother   . Heart disease Paternal Grandmother   . Bipolar disorder Paternal Grandmother   . Dementia Paternal Grandmother   . Heart disease Maternal Grandmother   . Dementia Maternal Grandmother   . Stroke Maternal Grandfather   . Prostate cancer Maternal Grandfather   . Personality disorder Father   . Bipolar disorder Father   . Alcohol abuse Father   . ADD / ADHD Sister   . Anxiety disorder Brother   . ADD / ADHD Brother   . ADD / ADHD Brother   . Seizures Daughter   . Drug abuse Neg Hx   . OCD Neg Hx   . Paranoid behavior Neg Hx   . Schizophrenia Neg Hx     Social History:  Social History   Socioeconomic History  . Marital status: Single    Spouse name: Not on file  . Number of children: 1  . Years of education: GED  . Highest education level: Not on file  Social Needs  . Financial resource strain: Not on file  . Food insecurity - worry: Not on file  . Food insecurity - inability: Not on file  . Transportation needs - medical: Not on file  . Transportation needs - non-medical: Not on file  Occupational History  . Occupation: [employer]  Tobacco Use  . Smoking status: Current Every Day Smoker    Packs/day: 0.50    Years: 10.00    Pack years: 5.00    Types: Cigarettes    Start date: 02/27/2002  . Smokeless tobacco: Never Used  . Tobacco comment: quit smoking in 2010 during pregnacy then started back Feb. 2011   Substance and Sexual Activity  . 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 Drug use: No    Comment: marijuana and pain pills; 12/08/07-smoked x 1 month ago- this was the first time in 2.years.  Morphine Sulfate and oxycontin - none since age 37  . Sexual activity: Yes    Birth control/protection: Injection, Spermicide  Other  Topics Concern  . Not on file  Social History Narrative   Lives with 3 roommates   LIKES TO WATCH DR. PIMPLE POPPER. AT AGE 4 LIKED TO PICK HER NOSE AND EAT THE DRIED MUCOUS.    Caffeine: coffee, soda, occasional Red Bull    Allergies:  Allergies  Allergen Reactions  . Sulfonamide Derivatives Swelling    SWELLING OF EYES WITH OPHTHALMIC SULFA  . Coconut Flavor Rash    Metabolic Disorder Labs: Lab Results  Component Value Date   HGBA1C 5.0  07/17/2014   MPG 97 08/28/2011   No results found for: PROLACTIN Lab Results  Component Value Date   CHOL  02/06/2007    139        ATP III CLASSIFICATION:  <200     mg/dL   Desirable  200-239  mg/dL   Borderline High  >=240    mg/dL   High   TRIG 72 02/06/2007   HDL 33 (L) 02/06/2007   CHOLHDL 4.2 02/06/2007   VLDL 14 02/06/2007   LDLCALC  02/06/2007    92        Total Cholesterol/HDL:CHD Risk Coronary Heart Disease Risk Table                     Men   Women  1/2 Average Risk   3.4   3.3   Lab Results  Component Value Date   TSH 1.299 03/31/2008   TSH 1.424 Test methodology is 3rd generation TSH 02/05/2007    Therapeutic Level Labs: No results found for: LITHIUM No results found for: VALPROATE No components found for:  CBMZ  Current Medications: Current Outpatient Medications  Medication Sig Dispense Refill  . acetaminophen (TYLENOL) 500 MG tablet Take 1,000-2,000 mg by mouth every 6 (six) hours as needed for mild pain, moderate pain, fever or headache.    . albuterol (PROVENTIL HFA;VENTOLIN HFA) 108 (90 Base) MCG/ACT inhaler Inhale 1-2 puffs into the lungs every 6 (six) hours as needed for wheezing or shortness of breath.    . ARIPiprazole (ABILIFY) 5 MG tablet Take 1 tablet (5 mg total) by mouth daily. 30 tablet 0  . FLUoxetine (PROZAC) 40 MG capsule Take 1 capsule (40 mg total) daily by mouth. 30 capsule 0  . fluticasone (FLONASE) 50 MCG/ACT nasal spray USE 2 SPRAYS IN EACH NOSTRIL EVERY DAY (Patient taking  differently: USE 2 SPRAYS IN EACH NOSTRIL EVERY DAY as needed for allergies) 16 g 5  . hydrOXYzine (ATARAX/VISTARIL) 25 MG tablet Take 1 tablet (25 mg total) 3 (three) times daily by mouth. (Patient taking differently: Take 25 mg by mouth every 8 (eight) hours as needed for anxiety. ) 30 tablet 0  . lamoTRIgine (LAMICTAL) 100 MG tablet Take 200 mg by mouth at bedtime.    . lamoTRIgine (LAMICTAL) 200 MG tablet Take 1 tablet (200 mg total) at bedtime by mouth. (Patient not taking: Reported on 02/20/2017) 30 tablet 0  . medroxyPROGESTERone (DEPO-PROVERA) 150 MG/ML injection Inject 150 mg into the muscle every 3 (three) months.    . nicotine (NICODERM CQ - DOSED IN MG/24 HOURS) 21 mg/24hr patch Place 1 patch (21 mg total) onto the skin daily. (Patient not taking: Reported on 02/20/2017) 28 patch 0  . ondansetron (ZOFRAN) 4 MG tablet Take 1 tablet (4 mg total) by mouth every 8 (eight) hours as needed for nausea or vomiting. 10 tablet 0  . traZODone (DESYREL) 50 MG tablet Take 1 tablet (50 mg total) by mouth at bedtime as needed for sleep. 30 tablet 0   No current facility-administered medications for this visit.      Musculoskeletal: Strength & Muscle Tone: within normal limits Gait & Station: normal Patient leans: N/A  Psychiatric Specialty Exam: ROS  Blood pressure 110/64, pulse 90, height 5\' 6"  (1.676 m), weight 207 lb (93.9 kg), SpO2 97 %.There is no height or weight on file to calculate BMI.  General Appearance: Casual  Eye Contact:  Good  Speech:  Clear and Coherent  Volume:  Normal  Mood:  Emotional  Affect:  Labile  Thought Process:  Goal Directed  Orientation:  Full (Time, Place, and Person)  Thought Content: Rumination   Suicidal Thoughts:  No  Homicidal Thoughts:  No  Memory:  Immediate;   Good Recent;   Good Remote;   Good  Judgement:  Good  Insight:  Good  Psychomotor Activity:  Normal  Concentration:  Concentration: Fair and Attention Span: Fair  Recall:  Good  Fund  of Knowledge: Good  Language: Good  Akathisia:  No  Handed:  Right  AIMS (if indicated): not done  Assets:  Communication Skills Desire for Improvement Housing Resilience Social Support  ADL's:  Intact  Cognition: WNL  Sleep:  Good   Screenings: AIMS     Admission (Discharged) from 01/26/2017 in [facility]  AIMS Total Score  0    AUDIT     Admission (Discharged) from 01/26/2017 in [facility]  Alcohol Use Disorder Identification Test Final Score (AUDIT)  0    Mini-Mental     Office Visit from 07/31/2016 in [facility]  Total Score (max 30 points )  29    PHQ2-9     Office Visit from 03/02/2017 in [facility]  PHQ-2 Total Score  2  PHQ-9 Total Score  11       Assessment and Plan: Bipolar disorder type I.  Anxiety disorder NOS.  Patient continued to struggle with her emotions but overall doing much better.  She is compliant with medication.  I will increase Abilify 10 mg daily, continue Lamictal 200 mg daily, trazodone 50 mg as needed for insomnia, Prozac 40 mg daily and Vistaril 25 mg daily for anxiety.  Discussed medication side effects and benefits.  Encouraged to continue [facility] for group therapy.  Recommended to call us back if she has any question, concern or if she feels worsening of the symptoms.  Follow-up in 2 months.   Kathlee Nations, MD 03/18/2017, 1:23 PM

## 2017-03-19 ENCOUNTER — Encounter: Payer: Medicaid Other | Admitting: Nurse Practitioner

## 2017-03-29 ENCOUNTER — Encounter: Payer: Self-pay | Admitting: Nurse Practitioner

## 2017-03-31 ENCOUNTER — Encounter (HOSPITAL_COMMUNITY): Payer: Self-pay

## 2017-03-31 DIAGNOSIS — G43909 Migraine, unspecified, not intractable, without status migrainosus: Secondary | ICD-10-CM | POA: Insufficient documentation

## 2017-03-31 DIAGNOSIS — Z5321 Procedure and treatment not carried out due to patient leaving prior to being seen by health care provider: Secondary | ICD-10-CM | POA: Insufficient documentation

## 2017-03-31 NOTE — ED Triage Notes (Signed)
Pt complains of a migraine, she hasn't had her depo injection yet and thinks that may be the cause of her headache

## 2017-04-01 ENCOUNTER — Emergency Department (HOSPITAL_COMMUNITY)
Admission: EM | Admit: 2017-04-01 | Discharge: 2017-04-01 | Disposition: A | Discharge status: Home / Self Care | Payer: Medicaid Other | Attending: Emergency Medicine | Admitting: Emergency Medicine

## 2017-04-01 NOTE — ED Notes (Signed)
Called x1 for bed. No answer.

## 2017-04-01 NOTE — ED Notes (Signed)
Pt called for room, no responce

## 2017-04-03 ENCOUNTER — Encounter: Payer: Self-pay | Admitting: Family Medicine

## 2017-04-05 ENCOUNTER — Emergency Department (HOSPITAL_COMMUNITY)
Admission: EM | Admit: 2017-04-05 | Discharge: 2017-04-06 | Disposition: A | Discharge status: Home / Self Care | Payer: Medicaid Other | Attending: Emergency Medicine | Admitting: Emergency Medicine

## 2017-04-05 ENCOUNTER — Other Ambulatory Visit: Payer: Self-pay

## 2017-04-05 ENCOUNTER — Encounter (HOSPITAL_COMMUNITY): Payer: Self-pay | Admitting: Emergency Medicine

## 2017-04-05 DIAGNOSIS — M25551 Pain in right hip: Secondary | ICD-10-CM | POA: Insufficient documentation

## 2017-04-05 DIAGNOSIS — Z5321 Procedure and treatment not carried out due to patient leaving prior to being seen by health care provider: Secondary | ICD-10-CM | POA: Insufficient documentation

## 2017-04-05 NOTE — ED Triage Notes (Signed)
Pt states she has fibromyalgia and is having right hip pain that radiates down her leg

## 2017-04-05 NOTE — ED Notes (Signed)
Registration states pt left 

## 2017-04-06 ENCOUNTER — Emergency Department (HOSPITAL_COMMUNITY)
Admission: EM | Admit: 2017-04-06 | Discharge: 2017-04-06 | Disposition: A | Discharge status: Home / Self Care | Payer: Self-pay | Attending: Emergency Medicine | Admitting: Emergency Medicine

## 2017-04-06 ENCOUNTER — Encounter (HOSPITAL_COMMUNITY): Payer: Self-pay | Admitting: Emergency Medicine

## 2017-04-06 ENCOUNTER — Emergency Department (HOSPITAL_COMMUNITY): Payer: Self-pay

## 2017-04-06 ENCOUNTER — Other Ambulatory Visit: Payer: Self-pay

## 2017-04-06 DIAGNOSIS — F909 Attention-deficit hyperactivity disorder, unspecified type: Secondary | ICD-10-CM | POA: Insufficient documentation

## 2017-04-06 DIAGNOSIS — F17210 Nicotine dependence, cigarettes, uncomplicated: Secondary | ICD-10-CM | POA: Insufficient documentation

## 2017-04-06 DIAGNOSIS — I10 Essential (primary) hypertension: Secondary | ICD-10-CM | POA: Insufficient documentation

## 2017-04-06 DIAGNOSIS — M5431 Sciatica, right side: Secondary | ICD-10-CM

## 2017-04-06 DIAGNOSIS — M5441 Lumbago with sciatica, right side: Secondary | ICD-10-CM | POA: Insufficient documentation

## 2017-04-06 DIAGNOSIS — M4726 Other spondylosis with radiculopathy, lumbar region: Secondary | ICD-10-CM

## 2017-04-06 DIAGNOSIS — J45909 Unspecified asthma, uncomplicated: Secondary | ICD-10-CM | POA: Insufficient documentation

## 2017-04-06 DIAGNOSIS — M25551 Pain in right hip: Secondary | ICD-10-CM

## 2017-04-06 DIAGNOSIS — G8929 Other chronic pain: Secondary | ICD-10-CM

## 2017-04-06 DIAGNOSIS — M62830 Muscle spasm of back: Secondary | ICD-10-CM

## 2017-04-06 LAB — POC URINE PREG, ED: PREG TEST UR: NEGATIVE

## 2017-04-06 MED ORDER — CYCLOBENZAPRINE HCL 10 MG PO TABS: 10.0000 mg | ORAL_TABLET | Freq: Three times a day (TID) | ORAL | 0 refills | Status: DC | PRN

## 2017-04-06 MED ORDER — PREDNISONE 20 MG PO TABS: ORAL_TABLET | ORAL | 0 refills | Status: DC

## 2017-04-06 MED ORDER — KETOROLAC TROMETHAMINE 30 MG/ML IJ SOLN
30.0000 mg | Freq: Once | INTRAMUSCULAR | Status: AC
  Administered 2017-04-06: 30 mg via INTRAMUSCULAR
  Filled 2017-04-06: qty 1

## 2017-04-06 MED ORDER — MELOXICAM 15 MG PO TABS: 15.0000 mg | ORAL_TABLET | Freq: Every day | ORAL | 0 refills | Status: DC

## 2017-04-06 MED ORDER — CYCLOBENZAPRINE HCL 10 MG PO TABS
10.0000 mg | ORAL_TABLET | Freq: Once | ORAL | Status: AC
  Administered 2017-04-06: 10 mg via ORAL
  Filled 2017-04-06: qty 1

## 2017-04-06 MED ORDER — PREDNISONE 20 MG PO TABS
60.0000 mg | ORAL_TABLET | Freq: Once | ORAL | Status: AC
  Administered 2017-04-06: 60 mg via ORAL
  Filled 2017-04-06: qty 3

## 2017-04-06 NOTE — ED Notes (Signed)
Pt ambulatory to bathroom with quick and steady gait.

## 2017-04-06 NOTE — ED Provider Notes (Signed)
[facility] DEPARTMENT Provider Note   CSN: [id number] Arrival date & time: 04/06/17  1100     History   Chief Complaint Chief Complaint  Patient presents with  . Hip Pain  . Leg Pain    HPI Kristin Stephens is a 32 y.o. female with a PMHx of fibromyalgia, HTN, migraines, and other conditions listed below, who presents to the ED with complaints of right hip pain x 2 days.  Patient states that she has fibromyalgia, and was recently taken off of many of her medications (including hydrocodone-APAP 7.5-325mg  and Gabapentin) because her doctor wanted her to have a "system flush"; however last week she finally restarted her gabapentin 900 mg 3 times daily because her pain was getting bad.  She is not sure whether her hip pain is due to her fibromyalgia or not.  She describes her pain as 7/10 constant sharp and burning right hip pain radiating into the right knee and down her right leg, worse with sitting or standing, mildly improved with laying flat, and unrelieved with Tylenol and Aleve.  She reports associated tingling down her right leg.  She mentions that she has chronic low back pain, but has noticed some right lower back pain today as well, although her back pain is unchanged from her chronic pain.  She denies any recent injuries, twisting or bending, heavy lifting.  She denies any history of IV drug use or cancer.  She also denies any fevers, chills, CP, SOB, abd pain, N/V/D/C, hematuria, dysuria, incontinence of urine/stool, saddle anesthesia/cauda equina symptoms, numbness, focal weakness, or any other complaints at this time.  She gets Depot-Provera shots for contraception, therefore she does not get menses.    The history is provided by the patient and medical records. No language interpreter was used.  Hip Pain  This is a new problem. The current episode started 2 days ago. The problem occurs constantly. The problem has not changed since onset.Pertinent  negatives include no chest pain, no abdominal pain and no shortness of breath. The symptoms are aggravated by standing (and sitting). The symptoms are relieved by lying down. Treatments tried: tylenol and aleve. The treatment provided no relief.  Leg Pain   Pertinent negatives include no numbness.    Past Medical History:  Diagnosis Date  . ADHD (attention deficit hyperactivity disorder)   . Anxiety   . Asthma    daily and prn inhalers  . Bipolar affective disorder ([place])   . Dental crowns present   . Depression   . Deviated nasal septum 05/2012  . Eczema    right leg  . Esophageal reflux   . Fibromyalgia   . Hypertension   . IBS (irritable bowel syndrome)   . Migraine headache   . Nasal turbinate hypertrophy 05/2012   bilateral  . OCD (obsessive compulsive disorder)   . TMJ syndrome     Patient Active Problem List   Diagnosis Date Noted  . Intentional overdose of drug in tablet form ([place]) 01/26/2017  . Bipolar 1 disorder, depressed, severe ([place]) 01/26/2017  . Bipolar 2 disorder ([place]) 05/16/2014  . Internal hemorrhoids with complication 92/01/9416  . Vasomotor rhinitis 04/15/2013  . Gastritis without bleeding 03/10/2013  . Anal fissure 03/10/2013  . Obsessive compulsive disorder 03/07/2013  . Fibromyalgia 02/06/2013  . Detachment of glenoid labrum 01/13/2013  . Difficulty in walking(719.7) 02/18/2012  . Seasonal affective disorder ([place]) 11/25/2011  . Asthma, intrinsic 08/28/2010  . ATTENTION DEFICIT DISORDER, ADULT 01/19/2008  .  RHINITIS MEDICAMENTOSA 12/08/2007  . ALLERGIC RHINITIS 12/08/2007  . IBS 12/08/2007  . ECZEMA 12/08/2007  . 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 Migraine headache 04/24/2007  . Essential hypertension 04/24/2007  . ASVD 04/24/2007  . GERD 04/24/2007  . VSD 04/24/2007  . 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 ASD AND VSD REPAIR  1989  . CARDIAC CATHETERIZATION  06/18/2006  . CESAREAN  SECTION  04/12/2009  . COARCTATION OF AORTA REPAIR  1989  . COLONOSCOPY N/A 05/20/2013   Procedure: COLONOSCOPY;  Surgeon: Danie Binder, MD;  Location: [facility];  Service: Endoscopy;  Laterality: N/A;  115-moved to [place] notified pt  . ESOPHAGOGASTRODUODENOSCOPY  04/04/2008     Normal esophagus/Mild patchy erythema in the antrum/ Normal duodenal bulb, normal small bowel biopsy  . FLEXIBLE SIGMOIDOSCOPY  04/04/2008     Small internal hemorrhoids (poor bowel prep)  . HEMORRHOID BANDING N/A 05/20/2013   Procedure: HEMORRHOID BANDING;  Surgeon: Danie Binder, MD;  Location: [facility];  Service: Endoscopy;  Laterality: N/A;  . NASAL SEPTOPLASTY W/ TURBINOPLASTY Bilateral 05/31/2012   Procedure: NASAL SEPTOPLASTY WITH TURBINATE REDUCTION;  Surgeon: Ascencion Dike, MD;  Location: [facility];  Service: ENT;  Laterality: Bilateral;    OB History    Gravida Para Term Preterm AB Living   1 1 1     1    SAB TAB Ectopic Multiple Live Births                   Home Medications    Prior to Admission medications   Medication Sig Start Date End Date Taking? Authorizing Provider  acetaminophen (TYLENOL) 500 MG tablet Take 1,000-2,000 mg by mouth every 6 (six) hours as needed for mild pain, moderate pain, fever or headache.   Yes [provider]  ARIPiprazole (ABILIFY) 10 MG tablet Take 1 tablet (10 mg total) by mouth daily. 03/18/17  Yes Arfeen, Arlyce Harman, MD  CINNAMON PO Take 1 tablet by mouth daily.   Yes [provider]  diphenhydrAMINE (BENADRYL) 25 MG tablet Take 25 mg by mouth every 6 (six) hours as needed for itching.   Yes [provider]  FLUoxetine (PROZAC) 40 MG capsule Take 1 capsule (40 mg total) by mouth daily. 03/18/17  Yes Arfeen, Arlyce Harman, MD  fluticasone (FLONASE) 50 MCG/ACT nasal spray USE 2 SPRAYS IN EACH NOSTRIL EVERY DAY Patient taking differently: USE 2 SPRAYS IN EACH NOSTRIL AS NEEDED FOR CONGESTION 02/02/17  Yes Mikey Kirschner,  MD  gabapentin (NEURONTIN) 300 MG capsule Take 600-900 mg by mouth See admin instructions. Take 600 mg in the morning then take 600mg  in the afternoon, then take 900mg  at night   Yes [provider]  Ginger, Zingiber officinalis, (GINGER PO) Take 1 tablet by mouth daily.   Yes [provider]  HYDROCORTISONE EX Apply 1 application topically as needed (ECZEMA).   Yes [provider]  ketorolac (TORADOL) 10 MG tablet Take 10 mg by mouth as needed for migraine.   Yes [provider]  lamoTRIgine (LAMICTAL) 200 MG tablet Take 1 tablet (200 mg total) by mouth at bedtime. 03/18/17  Yes Arfeen, Arlyce Harman, MD  loratadine (CLARITIN) 10 MG tablet Take 10 mg by mouth as needed for allergies.   Yes [provider]  medroxyPROGESTERone (DEPO-PROVERA) 150 MG/ML injection Inject 150 mg into the muscle every 3 (three) months.  Yes [provider]  Multiple Vitamin (MULTIVITAMIN WITH MINERALS) TABS tablet Take 1 tablet by mouth daily.   Yes [provider]  Pseudoephedrine-APAP-DM (DAYQUIL PO) Take 1 Dose by mouth as needed (Cough and Cold).   Yes [provider]  traZODone (DESYREL) 50 MG tablet Take 1 tablet (50 mg total) by mouth at bedtime as needed for sleep. 03/18/17  Yes Arfeen, Arlyce Harman, MD  albuterol (PROVENTIL HFA;VENTOLIN HFA) 108 (90 Base) MCG/ACT inhaler Inhale 1-2 puffs into the lungs every 6 (six) hours as needed for wheezing or shortness of breath.    [provider]  hydrOXYzine (ATARAX/VISTARIL) 25 MG tablet Take 1 tablet (25 mg total) by mouth daily as needed for anxiety. 03/18/17   Arfeen, Arlyce Harman, MD  ondansetron (ZOFRAN) 4 MG tablet Take 1 tablet (4 mg total) by mouth every 8 (eight) hours as needed for nausea or vomiting. 02/20/17   Pisciotta, Elmyra Ricks, PA-C    Family History Family History  Problem Relation Age of Onset  . Hyperlipidemia Mother   . Hypertension Mother   . Depression Mother   . Physical abuse Mother    . Sexual abuse Mother        possibly  . Sleep apnea Mother   . Emphysema Paternal Grandmother   . Heart disease Paternal Grandmother   . Bipolar disorder Paternal Grandmother   . Dementia Paternal Grandmother   . Heart disease Maternal Grandmother   . Dementia Maternal Grandmother   . Stroke Maternal Grandfather   . Prostate cancer Maternal Grandfather   . Personality disorder Father   . Bipolar disorder Father   . Alcohol abuse Father   . ADD / ADHD Sister   . Anxiety disorder Brother   . ADD / ADHD Brother   . ADD / ADHD Brother   . Seizures Daughter   . Drug abuse Neg Hx   . OCD Neg Hx   . Paranoid behavior Neg Hx   . Schizophrenia Neg Hx     Social History Social History   Tobacco Use  . Smoking status: Current Every Day Smoker    Packs/day: 0.50    Years: 10.00    Pack years: 5.00    Types: Cigarettes    Start date: 02/27/2002  . Smokeless tobacco: Never Used  . Tobacco comment: has cut back to less than 1/2 a pack and trying to quit  Substance Use Topics  . 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 Drug use: No    Comment: marijuana and pain pills; 12/08/07-smoked x 1 month ago- this was the first time in 2.years.  Morphine Sulfate and oxycontin - none since age 82     Allergies   Sulfonamide derivatives and Coconut flavor   Review of Systems Review of Systems  Constitutional: Negative for chills and fever.  Respiratory: Negative for shortness of breath.   Cardiovascular: Negative for chest pain.  Gastrointestinal: Negative for abdominal pain, constipation, diarrhea, nausea and vomiting.  Genitourinary: Negative for difficulty urinating (no incontinence), dysuria and hematuria.  Musculoskeletal: Positive for arthralgias (R hip), back pain (chronic unchanged) and myalgias (R thigh/leg).  Skin: Negative for color change.  Allergic/Immunologic: Negative for immunocompromised state.    Neurological: Negative for weakness and numbness.  Psychiatric/Behavioral: Negative for confusion.   All other systems reviewed and are negative for acute change except as noted in the HPI.    Physical  Exam Updated Vital Signs BP 120/70 (BP Location: Right Arm)   Pulse 100   Temp 98.4 F (36.9 C)   Resp 18   Ht 5\' 5"  (1.651 m)   Wt 90.7 kg (200 lb)   SpO2 97%   BMI 33.28 kg/m   Physical Exam  Constitutional: She is oriented to person, place, and time. Vital signs are normal. She appears well-developed and well-nourished.  Non-toxic appearance. No distress.  Afebrile, nontoxic, NAD  HENT:  Head: Normocephalic and atraumatic.  Mouth/Throat: Mucous membranes are normal.  Eyes: Conjunctivae and EOM are normal. Right eye exhibits no discharge. Left eye exhibits no discharge.  Neck: Normal range of motion. Neck supple.  Cardiovascular: Normal rate and intact distal pulses.  Pulmonary/Chest: Effort normal. No respiratory distress.  Abdominal: Normal appearance. She exhibits no distension.  Musculoskeletal: Normal range of motion.       Right hip: She exhibits tenderness and bony tenderness. She exhibits normal range of motion, normal strength, no swelling, no crepitus and no deformity.       Lumbar back: She exhibits tenderness and spasm. She exhibits normal range of motion, no bony tenderness and no deformity.       Back:       Legs: Lumbar spine with FROM intact without spinous process TTP, no bony stepoffs or deformities, with mild R sided paraspinous muscle TTP and slight muscle spasms. +SLR on R side. No overlying skin changes. R hip with FROM intact, with diffuse lateral joint line TTP which extends down IT band and into quads musculature, no bruising or swelling, no crepitus or deformity, no limb length discrepancy or abnormal rotation. No pain with log roll testing.  Strength and sensation grossly intact in all extremities, gait steady and nonantalgic. Distal pulses intact.    Neurological: She is alert and oriented to person, place, and time. She has normal strength. No sensory deficit.  Skin: Skin is warm, dry and intact. No rash noted.  Psychiatric: She has a normal mood and affect. Her behavior is normal.  Nursing note and vitals reviewed.    ED Treatments / Results  Labs (all labs ordered are listed, but only abnormal results are displayed) Labs Reviewed  POC URINE PREG, ED    EKG  EKG Interpretation None       Radiology Dg Lumbar Spine Complete  Result Date: 04/06/2017 CLINICAL DATA:  Pain from fibromyalgia in the right hip radiating to the right lower extremity. EXAM: LUMBAR SPINE - COMPLETE 4+ VIEW COMPARISON:  None. FINDINGS: There is no evidence of lumbar spine fracture. Mild exaggerated kyphosis centered at T11-T12, with mild osteoarthritic changes at T11-T12 and T12-L1. IMPRESSION: Mild exaggerated kyphosis centered at T11-T12, with mild osteoarthritic changes at T11-T12 and T12-L1. Electronically Signed   By: Fidela Salisbury M.D.   On: 04/06/2017 16:54   Dg Hip Unilat W Or Wo Pelvis 2-3 Views Right  Result Date: 04/06/2017 CLINICAL DATA:  Right hip pain for 2 days. EXAM: DG HIP (WITH OR WITHOUT PELVIS) 2-3V RIGHT COMPARISON:  None. FINDINGS: Both hips are normally located. No degenerative changes or acute bony findings. No plain film evidence of avascular necrosis. The pubic symphysis and SI joints are intact. No pelvic fractures or bone lesions. IMPRESSION: Normal hip/pelvic radiographs. Electronically Signed   By: Marijo Sanes M.D.   On: 04/06/2017 16:55    Procedures Procedures (including critical care time)  Medications Ordered in ED Medications  ketorolac (TORADOL) 30 MG/ML injection 30 mg (30 mg Intramuscular Given  04/06/17 1630)  predniSONE (DELTASONE) tablet 60 mg (60 mg Oral Given 04/06/17 1631)  cyclobenzaprine (FLEXERIL) tablet 10 mg (10 mg Oral Given 04/06/17 1631)     Initial Impression / Assessment and Plan / ED Course   I have reviewed the triage vital signs and the nursing notes.  Pertinent labs & imaging results that were available during my care of the patient were reviewed by me and considered in my medical decision making (see chart for details).     32 y.o. female here with R hip pain x2 days, thinks it could be fibromyalgia pain, but having tingling in her leg as well. Just restarted gabapentin 900mg  TID, had been off of it for about 1 month. States she has chronic back pain which is unchanged. No injury/trauma. On exam, mild R lumbar paraspinous muscle TTP and spasm, no midline spinal tenderness, no red flag s/sx of cauda equina syndrome. Extremities NVI with soft compartments. Gait steady and nonantalgic. R hip with diffuse lateral joint line TTP and tenderness extending into quads and down IT band area. +SLR on R side. Will check xray of R hip and lumbar spine to see if she has any component of degenerative changes or arthritic findings, vs hip impingement; doubt need for CT/MRI at this time. Will get BetaHCG since pt doesn't have menses due to being on Depot Provera shots. Doubt need for other labs. No urinary complaints, doubt need for U/A. Will give toradol, prednisone, and flexeril, then reassess shortly.   5:14 PM Upreg neg. Lumbar xray with mildly exaggerated kyphosis around T11-12, and mild osteoarthritic changes at T11-12 and T12-L1. R hip xray without evidence of arthritis or other finding; on my evaluation, doesn't appear to have any obvious cam-pincer deformity to indicate hip impingement syndrome, although it's still possible that this could be a cause of her hip pain. Pt feeling better. I suspect her symptoms are multifactorial, somewhat due to her recent cessation of her chronic meds for fibromyalgia, and possible somewhat related to sciatica, and mild arthritis of the lumbar spine (although in a different area than expected for her symptom distribution). Will start on prednisone, mobic, and  flexeril; advised use of tylenol and other OTC remedies for symptomatic relief. Discussed use of heat, avoidance of heavy lifting, and f/up with PCP in 1wk for recheck of symptoms. Discussed continuation of all home meds as directed by her doctor. I explained the diagnosis and have given explicit precautions to return to the ER including for any other new or worsening symptoms. The patient understands and accepts the medical plan as it's been dictated and I have answered their questions. Discharge instructions concerning home care and prescriptions have been given. The patient is STABLE and is discharged to home in good condition.   Of note: [facility] reviewed prior to dispensing controlled substance medications, and 1 year search was notable for: Hydrocodone-APAP 7.5-325mg  #20 tabs on 12/05/16, 09/01/16, and 05/23/16; also with adderall 20mg  #30 tabs monthly from 01/06/17 and earlier; and lorazepam 0.5mg  #30 tabs on 06/30/16.  Advised that any narcotics/controlled substances would need to be given from her PCP, will NOT be discharging with narcotics today.    Final Clinical Impressions(s) / ED Diagnoses   Final diagnoses:  Right hip pain  Sciatica of right side  Back muscle spasm  Chronic bilateral low back pain with right-sided sciatica  Osteoarthritis of spine with radiculopathy, lumbar region    ED Discharge Orders        Ordered  predniSONE (DELTASONE) 20 MG tablet     04/06/17 1703    meloxicam (MOBIC) 15 MG tablet  Daily     04/06/17 1703    cyclobenzaprine (FLEXERIL) 10 MG tablet  3 times daily PRN     04/06/17 [address] [place], [place] 04/06/17 1714    Margette Fast, MD 04/06/17 1946

## 2017-04-06 NOTE — Discharge Instructions (Signed)
Your symptoms could be due to your fibromyalgia pain, as well as sciatica, and some muscle spasms in your lumbar back.  Continue all of your usual home medications as directed by your regular doctor.  Use mobic daily as directed, don't use additional NSAIDs like ibuprofen/aleve/etc while taking mobic. Use additional tylenol as needed for additional pain relief.   Use flexeril for muscle relaxation. Do not drive or operate machinery with muscle relaxant use.  Take prednisone as directed until completed, starting tomorrow 04/07/17 since you were given today's dose here in the ER.  Use heat to areas of soreness, no more than 20 minutes at a time every hour. Avoid heavy lifting over 10 pounds for the next 2 weeks.  Follow up with your primary care physician for recheck of ongoing symptoms in the next 1-2 weeks. Return to ER for emergent changing or worsening of symptoms.

## 2017-04-06 NOTE — ED Triage Notes (Signed)
Onset 2 days ago developed break through pain from fibromyalgia in right hip with intermittent radiating right lower extremity pain.

## 2017-04-06 NOTE — ED Notes (Signed)
Pt back from XRAY 

## 2017-04-06 NOTE — ED Notes (Signed)
Pt verbalized understanding of discharge instructions and denies any further questions at this time.   

## 2017-04-06 NOTE — ED Notes (Signed)
Patient transported to X-ray 

## 2017-04-07 ENCOUNTER — Telehealth: Payer: Self-pay | Admitting: Nurse Practitioner

## 2017-04-07 DIAGNOSIS — M797 Fibromyalgia: Secondary | ICD-10-CM

## 2017-04-07 NOTE — Telephone Encounter (Signed)
Patient is aware of all. She is awaiting a call for the appt at the pain clinic.

## 2017-04-07 NOTE — Telephone Encounter (Signed)
She is already on a higher dose of Gabapentin. Also, hold on opiates due to her mental health history. If she is still having uncontrolled pain, recommend referral to pain specialist.

## 2017-04-07 NOTE — Telephone Encounter (Signed)
Pt called wanting to know how much [facility] wanted her to go up on her gabapentin (NEURONTIN) 300 MG capsule  and also wants to know if [facility] will write her a prescription for HYDROcodone-acetaminophen (NORCO) 7.5-325 MG per tablet

## 2017-04-09 ENCOUNTER — Ambulatory Visit: Payer: Self-pay | Admitting: Internal Medicine

## 2017-04-16 ENCOUNTER — Emergency Department (HOSPITAL_COMMUNITY)
Admission: EM | Admit: 2017-04-16 | Discharge: 2017-04-16 | Disposition: A | Discharge status: Home / Self Care | Payer: Medicaid Other | Attending: Emergency Medicine | Admitting: Emergency Medicine

## 2017-04-16 ENCOUNTER — Encounter (HOSPITAL_COMMUNITY): Payer: Self-pay | Admitting: Emergency Medicine

## 2017-04-16 DIAGNOSIS — G43909 Migraine, unspecified, not intractable, without status migrainosus: Secondary | ICD-10-CM | POA: Insufficient documentation

## 2017-04-16 DIAGNOSIS — Z5321 Procedure and treatment not carried out due to patient leaving prior to being seen by health care provider: Secondary | ICD-10-CM | POA: Insufficient documentation

## 2017-04-16 NOTE — ED Triage Notes (Signed)
Patient c/o migraine since this morning with nausea and light sensitivity. Ambulatory. Hx migraines.

## 2017-04-30 ENCOUNTER — Other Ambulatory Visit (HOSPITAL_COMMUNITY): Payer: Self-pay | Admitting: Psychiatry

## 2017-04-30 DIAGNOSIS — F3131 Bipolar disorder, current episode depressed, mild: Secondary | ICD-10-CM

## 2017-05-06 ENCOUNTER — Other Ambulatory Visit (HOSPITAL_COMMUNITY): Payer: Self-pay | Admitting: Psychiatry

## 2017-05-07 ENCOUNTER — Other Ambulatory Visit (HOSPITAL_COMMUNITY): Payer: Self-pay | Admitting: Psychiatry

## 2017-05-07 DIAGNOSIS — F3131 Bipolar disorder, current episode depressed, mild: Secondary | ICD-10-CM

## 2017-05-19 ENCOUNTER — Ambulatory Visit (INDEPENDENT_AMBULATORY_CARE_PROVIDER_SITE_OTHER): Payer: Self-pay | Admitting: Psychiatry

## 2017-05-19 ENCOUNTER — Other Ambulatory Visit (HOSPITAL_COMMUNITY): Payer: Self-pay | Admitting: Psychiatry

## 2017-05-19 ENCOUNTER — Encounter (HOSPITAL_COMMUNITY): Payer: Self-pay | Admitting: Psychiatry

## 2017-05-19 VITALS — BP 127/80 | HR 101 | Ht 65.0 in | Wt 213.2 lb

## 2017-05-19 DIAGNOSIS — Z915 Personal history of self-harm: Secondary | ICD-10-CM

## 2017-05-19 DIAGNOSIS — F17210 Nicotine dependence, cigarettes, uncomplicated: Secondary | ICD-10-CM

## 2017-05-19 DIAGNOSIS — M549 Dorsalgia, unspecified: Secondary | ICD-10-CM

## 2017-05-19 DIAGNOSIS — Z811 Family history of alcohol abuse and dependence: Secondary | ICD-10-CM

## 2017-05-19 DIAGNOSIS — F419 Anxiety disorder, unspecified: Secondary | ICD-10-CM

## 2017-05-19 DIAGNOSIS — Z818 Family history of other mental and behavioral disorders: Secondary | ICD-10-CM

## 2017-05-19 DIAGNOSIS — F3131 Bipolar disorder, current episode depressed, mild: Secondary | ICD-10-CM

## 2017-05-19 DIAGNOSIS — Z810 Family history of intellectual disabilities: Secondary | ICD-10-CM

## 2017-05-19 DIAGNOSIS — F900 Attention-deficit hyperactivity disorder, predominantly inattentive type: Secondary | ICD-10-CM

## 2017-05-19 DIAGNOSIS — R450 Nervousness: Secondary | ICD-10-CM

## 2017-05-19 MED ORDER — LAMOTRIGINE 200 MG PO TABS: 200.0000 mg | ORAL_TABLET | Freq: Every day | ORAL | 1 refills | Status: DC

## 2017-05-19 MED ORDER — LISDEXAMFETAMINE DIMESYLATE 30 MG PO CAPS: 30.0000 mg | ORAL_CAPSULE | Freq: Every day | ORAL | 0 refills | Status: DC

## 2017-05-19 MED ORDER — FLUOXETINE HCL 40 MG PO CAPS: 40.0000 mg | ORAL_CAPSULE | Freq: Every day | ORAL | 1 refills | Status: DC

## 2017-05-19 MED ORDER — ARIPIPRAZOLE 10 MG PO TABS: 10.0000 mg | ORAL_TABLET | Freq: Every day | ORAL | 1 refills | Status: DC

## 2017-05-19 NOTE — Progress Notes (Signed)
[place] MD/PA/NP OP Progress Note  05/19/2017 10:27 AM Kristin Stephens  MRN:  [id number]  Chief Complaint: I need something to help my ADD.  I cannot focus and have difficulty multitasking.  HPI: Kristin Stephens came for her follow-up appointment.  She is compliant with medication and denies any side effects.  She endorsed having difficulty multitasking and there are days that she cannot finish her job on time.  She is working part-time at [employer].  Mid at times poor attention and concentration.  She is compliant with group therapy at [place] once a week and also goes to [employer].  She feels proud that she has been not drinking since the last hospitalization in 2018.  She had a good holidays.  She continued to face time her daughter very frequently and try to visit her physically every 2 weeks.  She like to go back on the stimulants because she feels there are days that she has difficulty doing her job.  She gets tired with lack of motivation and desire to do things.  She is hoping to get a full-time job in the future.  Patient denies any paranoia, hallucination, crying spells or any suicidal thoughts.  She lives by herself.  Patient has no rash, itching or tremors.  She has cut down her Vistaril and trazodone and does not need every day.  Her appetite is okay.  Recently she visited emergency room for migraine headaches and she was given cocktail of medication which works very well.  Visit Diagnosis:    ICD-10-CM   1. Bipolar affective disorder, currently depressed, mild (HCC) F31.31 lamoTRIgine (LAMICTAL) 200 MG tablet    FLUoxetine (PROZAC) 40 MG capsule    ARIPiprazole (ABILIFY) 10 MG tablet  2. Attention deficit hyperactivity disorder (ADHD), predominantly inattentive type F90.0 lisdexamfetamine (VYVANSE) 30 MG capsule    Past Psychiatric History: Reviewed. Patient has history of mood swing, depression, anger and she was diagnosed with bipolar and ADD. She has a  history of self abusive behavior by cutting her wrist with a razor. In the past she had tried Wellbutrin but limited response. Patient finished intensive outpatient program in 2016.Shee was admitted to behavioral [facility] in October 2018 after taking overdose on medication and then slashing her wrist. Her Adderall, Ativan and gabapentin was discontinued  Past Medical History:  Past Medical History:  Diagnosis Date  . ADHD (attention deficit hyperactivity disorder)   . Anxiety   . Asthma    daily and prn inhalers  . Bipolar affective disorder ([place])   . Dental crowns present   . Depression   . Deviated nasal septum 05/2012  . Eczema    right leg  . Esophageal reflux   . Fibromyalgia   . Hypertension   . IBS (irritable bowel syndrome)   . Migraine headache   . Nasal turbinate hypertrophy 05/2012   bilateral  . OCD (obsessive compulsive disorder)   . TMJ syndrome     Past Surgical History:  Procedure Laterality Date  . ASD AND VSD REPAIR  1989  . CARDIAC CATHETERIZATION  06/18/2006  . CESAREAN SECTION  04/12/2009  . COARCTATION OF AORTA REPAIR  1989  . COLONOSCOPY N/A 05/20/2013   Procedure: COLONOSCOPY;  Surgeon: Danie Binder, MD;  [facility];  Service: Endoscopy;  Laterality: N/A;  115-moved to [place] notified pt  . ESOPHAGOGASTRODUODENOSCOPY  04/04/2008     Normal esophagus/Mild patchy erythema in the antrum/ Normal duodenal bulb,  normal small bowel biopsy  . FLEXIBLE SIGMOIDOSCOPY  04/04/2008     Small internal hemorrhoids (poor bowel prep)  . HEMORRHOID BANDING N/A 05/20/2013   Procedure: HEMORRHOID BANDING;  Surgeon: Danie Binder, MD;  [facility];  Service: Endoscopy;  Laterality: N/A;  . NASAL SEPTOPLASTY W/ TURBINOPLASTY Bilateral 05/31/2012   Procedure: NASAL SEPTOPLASTY WITH TURBINATE REDUCTION;  Surgeon: Ascencion Dike, MD;  Location: [place];  Service: ENT;  Laterality: Bilateral;    Family Psychiatric History:  Reviewed.  Family History:  Family History  Problem Relation Age of Onset  . Hyperlipidemia Mother   . Hypertension Mother   . Depression Mother   . Physical abuse Mother   . Sexual abuse Mother        possibly  . Sleep apnea Mother   . Emphysema Paternal Grandmother   . Heart disease Paternal Grandmother   . Bipolar disorder Paternal Grandmother   . Dementia Paternal Grandmother   . Heart disease Maternal Grandmother   . Dementia Maternal Grandmother   . Stroke Maternal Grandfather   . Prostate cancer Maternal Grandfather   . Personality disorder Father   . Bipolar disorder Father   . Alcohol abuse Father   . ADD / ADHD Sister   . Anxiety disorder Brother   . ADD / ADHD Brother   . ADD / ADHD Brother   . Seizures Daughter   . Drug abuse Neg Hx   . OCD Neg Hx   . Paranoid behavior Neg Hx   . Schizophrenia Neg Hx     Social History:  Social History   Socioeconomic History  . Marital status: Single    Spouse name: None  . Number of children: 1  . Years of education: GED  . Highest education level: None  Social Needs  . Financial resource strain: None  . Food insecurity - worry: None  . Food insecurity - inability: None  . Transportation needs - medical: None  . Transportation needs - non-medical: None  Occupational History  . Occupation: [employer]  Tobacco Use  . Smoking status: Current Every Day Smoker    Packs/day: 0.50    Years: 10.00    Pack years: 5.00    Types: Cigarettes    Start date: 02/27/2002  . Smokeless tobacco: Never Used  . Tobacco comment: has cut back to less than 1/2 a pack and trying to quit  Substance and Sexual Activity  . 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 Drug use: No    Comment: marijuana and pain pills; 12/08/07-smoked x 1 month ago- this was the first time in 2.years.  Morphine Sulfate and oxycontin - none since age 15  . Sexual activity: Not Currently    Birth  control/protection: Injection, Spermicide  Other Topics Concern  . None  Social History Narrative   Lives with 3 roommates   LIKES TO WATCH DR. PIMPLE POPPER. AT AGE 59 LIKED TO PICK HER NOSE AND EAT THE DRIED MUCOUS.    Caffeine: coffee, soda, occasional Red Bull    Allergies:  Allergies  Allergen Reactions  . Sulfonamide Derivatives Swelling    SWELLING OF EYES WITH OPHTHALMIC SULFA  . Coconut Flavor Rash    Metabolic Disorder Labs: Recent Results (from the past 2160 hour(s))  Lipase, blood     Status: None   Collection Time: 02/20/17  6:59 AM  Result  Value Ref Range   Lipase 21 11 - 51 U/L  Comprehensive metabolic panel     Status: Abnormal   Collection Time: 02/20/17  6:59 AM  Result Value Ref Range   Sodium 139 135 - 145 mmol/L   Potassium 3.5 3.5 - 5.1 mmol/L   Chloride 106 101 - 111 mmol/L   CO2 24 22 - 32 mmol/L   Glucose, Bld 113 (H) 65 - 99 mg/dL   BUN 10 6 - 20 mg/dL   Creatinine, Ser 0.66 0.44 - 1.00 mg/dL   Calcium 9.3 8.9 - 10.3 mg/dL   Total Protein 7.7 6.5 - 8.1 g/dL   Albumin 4.6 3.5 - 5.0 g/dL   AST 23 15 - 41 U/L   ALT 25 14 - 54 U/L   Alkaline Phosphatase 85 38 - 126 U/L   Total Bilirubin 0.9 0.3 - 1.2 mg/dL   GFR calc non Af Amer >60 >60 mL/min   GFR calc Af Amer >60 >60 mL/min    Comment: (NOTE) The eGFR has been calculated using the CKD EPI equation. This calculation has not been validated in all clinical situations. eGFR's persistently <60 mL/min signify possible Chronic Kidney Disease.    Anion gap 9 5 - 15  CBC     Status: None   Collection Time: 02/20/17  6:59 AM  Result Value Ref Range   WBC 8.6 4.0 - 10.5 K/uL   RBC 4.70 3.87 - 5.11 MIL/uL   Hemoglobin 14.4 12.0 - 15.0 g/dL   HCT 42.0 36.0 - 46.0 %   MCV 89.4 78.0 - 100.0 fL   MCH 30.6 26.0 - 34.0 pg   MCHC 34.3 30.0 - 36.0 g/dL   RDW 13.2 11.5 - 15.5 %   Platelets 309 150 - 400 K/uL  Acetaminophen level     Status: Abnormal   Collection Time: 02/20/17  7:24 AM  Result Value  Ref Range   Acetaminophen (Tylenol), Serum <10 (L) 10 - 30 ug/mL    Comment:        THERAPEUTIC CONCENTRATIONS VARY SIGNIFICANTLY. A RANGE OF 10-30 ug/mL MAY BE AN EFFECTIVE CONCENTRATION FOR MANY PATIENTS. HOWEVER, SOME ARE BEST TREATED AT CONCENTRATIONS OUTSIDE THIS RANGE. ACETAMINOPHEN CONCENTRATIONS >150 ug/mL AT 4 HOURS AFTER INGESTION AND >50 ug/mL AT 12 HOURS AFTER INGESTION ARE OFTEN ASSOCIATED WITH TOXIC REACTIONS.   Salicylate level     Status: None   Collection Time: 02/20/17  7:24 AM  Result Value Ref Range   Salicylate Lvl <2.1 2.8 - 30.0 mg/dL  I-Stat Beta hCG blood, ED ([facility], [facility], [facility] only)     Status: None   Collection Time: 02/20/17  7:30 AM  Result Value Ref Range   I-stat hCG, quantitative <5.0 <5 mIU/mL   Comment 3            Comment:   GEST. AGE      CONC.  (mIU/mL)   <=1 WEEK        5 - 50     2 WEEKS       50 - 500     3 WEEKS       100 - 10,000     4 WEEKS     1,000 - 30,000        FEMALE AND NON-PREGNANT FEMALE:     LESS THAN 5 mIU/mL   POC Urine Pregnancy, ED (do NOT order at [facility])     Status: None   Collection Time: 04/06/17  4:11 PM  Result Value Ref Range   Preg Test, Ur NEGATIVE NEGATIVE    Comment:        THE SENSITIVITY OF THIS METHODOLOGY IS >24 mIU/mL    Lab Results  Component Value Date   HGBA1C 5.0 07/17/2014   MPG 97 08/28/2011   No results found for: PROLACTIN Lab Results  Component Value Date   CHOL  02/06/2007    139        ATP III CLASSIFICATION:  <200     mg/dL   Desirable  200-239  mg/dL   Borderline High  >=240    mg/dL   High   TRIG 72 02/06/2007   HDL 33 (L) 02/06/2007   CHOLHDL 4.2 02/06/2007   VLDL 14 02/06/2007   LDLCALC  02/06/2007    92        Total Cholesterol/HDL:CHD Risk Coronary Heart Disease Risk Table                     Men   Women  1/2 Average Risk   3.4   3.3   Lab Results  Component Value Date   TSH 1.299 03/31/2008   TSH 1.424 Test methodology is 3rd generation TSH 02/05/2007     Therapeutic Level Labs: No results found for: LITHIUM No results found for: VALPROATE No components found for:  CBMZ  Current Medications: Current Outpatient Medications  Medication Sig Dispense Refill  . acetaminophen (TYLENOL) 500 MG tablet Take 1,000-2,000 mg by mouth every 6 (six) hours as needed for mild pain, moderate pain, fever or headache.    . albuterol (PROVENTIL HFA;VENTOLIN HFA) 108 (90 Base) MCG/ACT inhaler Inhale 1-2 puffs into the lungs every 6 (six) hours as needed for wheezing or shortness of breath.    . ARIPiprazole (ABILIFY) 10 MG tablet Take 1 tablet (10 mg total) by mouth daily. 30 tablet 1  . CINNAMON PO Take 1 tablet by mouth daily.    . cyclobenzaprine (FLEXERIL) 10 MG tablet Take 1 tablet (10 mg total) by mouth 3 (three) times daily as needed for muscle spasms. 15 tablet 0  . diphenhydrAMINE (BENADRYL) 25 MG tablet Take 25 mg by mouth every 6 (six) hours as needed for itching.    Marland Kitchen FLUoxetine (PROZAC) 40 MG capsule Take 1 capsule (40 mg total) by mouth daily. 30 capsule 1  . fluticasone (FLONASE) 50 MCG/ACT nasal spray USE 2 SPRAYS IN EACH NOSTRIL EVERY DAY (Patient taking differently: USE 2 SPRAYS IN EACH NOSTRIL AS NEEDED FOR CONGESTION) 16 g 5  . gabapentin (NEURONTIN) 300 MG capsule Take 600-900 mg by mouth See admin instructions. Take 600 mg in the morning then take '600mg'$  in the afternoon, then take '900mg'$  at night    . Ginger, Zingiber officinalis, (GINGER PO) Take 1 tablet by mouth daily.    Marland Kitchen HYDROCORTISONE EX Apply 1 application topically as needed (ECZEMA).    . hydrOXYzine (ATARAX/VISTARIL) 25 MG tablet Take 1 tablet (25 mg total) by mouth daily as needed for anxiety. 30 tablet 1  . ketorolac (TORADOL) 10 MG tablet Take 10 mg by mouth as needed for migraine.    . lamoTRIgine (LAMICTAL) 200 MG tablet Take 1 tablet (200 mg total) by mouth at bedtime. 30 tablet 1  . medroxyPROGESTERone (DEPO-PROVERA) 150 MG/ML injection Inject 150 mg into the muscle every  3 (three) months.    . meloxicam (MOBIC) 15 MG tablet Take 1 tablet (15 mg total) by mouth daily. TAKE WITH MEALS 30 tablet 0  .  Multiple Vitamin (MULTIVITAMIN WITH MINERALS) TABS tablet Take 1 tablet by mouth daily.    . ondansetron (ZOFRAN) 4 MG tablet Take 1 tablet (4 mg total) by mouth every 8 (eight) hours as needed for nausea or vomiting. 10 tablet 0  . traZODone (DESYREL) 50 MG tablet Take 1 tablet (50 mg total) by mouth at bedtime as needed for sleep. 30 tablet 0  . loratadine (CLARITIN) 10 MG tablet Take 10 mg by mouth as needed for allergies.    . predniSONE (DELTASONE) 20 MG tablet 3 tabs po daily x 4 days BEGINNING 04/07/17 (Patient not taking: Reported on 05/19/2017) 12 tablet 0  . Pseudoephedrine-APAP-DM (DAYQUIL PO) Take 1 Dose by mouth as needed (Cough and Cold).     No current facility-administered medications for this visit.      Musculoskeletal: Strength & Muscle Tone: within normal limits Gait & Station: normal Patient leans: N/A  Psychiatric Specialty Exam: Review of Systems  HENT: Negative.   Respiratory: Negative.   Musculoskeletal: Positive for back pain.  Skin: Negative.  Negative for itching and rash.  Neurological: Negative.   Psychiatric/Behavioral: The patient is nervous/anxious.     Blood pressure 127/80, pulse (!) 101, height '5\' 5"'$  (1.651 m), weight 213 lb 3.2 oz (96.7 kg).Body mass index is 35.48 kg/m.  General Appearance: Casual  Eye Contact:  Good  Speech:  Clear and Coherent  Volume:  Normal  Mood:  Anxious  Affect:  Congruent  Thought Process:  Goal Directed  Orientation:  Full (Time, Place, and Person)  Thought Content: Logical   Suicidal Thoughts:  No  Homicidal Thoughts:  No  Memory:  Immediate;   Good Recent;   Good Remote;   Good  Judgement:  Good  Insight:  Good  Psychomotor Activity:  Normal  Concentration:  Concentration: Fair and Attention Span: Fair  Recall:  Good  Fund of Knowledge: Good  Language: Good  Akathisia:  No   Handed:  Right  AIMS (if indicated): not done  Assets:  Communication Skills Desire for Improvement Housing Resilience Social Support  ADL's:  Intact  Cognition: WNL  Sleep:  Good   Screenings: AIMS     Admission (Discharged) from 01/26/2017 in [facility]  AIMS Total Score  0    AUDIT     Admission (Discharged) from 01/26/2017 in [facility]  Alcohol Use Disorder Identification Test Final Score (AUDIT)  0    Mini-Mental     Office Visit from 07/31/2016 in [facility]  Total Score (max 30 points )  29    PHQ2-9     Office Visit from 03/02/2017 in [place]  PHQ-2 Total Score  2  PHQ-9 Total Score  11       Assessment and Plan: Bipolar disorder type I.  Anxiety disorder NOS.  ADD, inattentive type.  I review her symptoms, history.  Patient has history of attention deficit disorder and she was taking Adderall which was discontinued when she was hospitalized last time.  Patient is currently going to [place] meeting and also attending once a week [facility] group therapy.  She has been sober from alcohol.  She like to go back on stimulant to help her focus, attention and multitasking.  I will start low-dose Vyvanse 30 mg to help her symptoms.  However I had a long discussion about medication side effects and potential stimulant abuse, withdrawal and tolerance.  I also discussed that  she should not be drinking alcohol but the stimulant.  Patient agreed with the plan.  I will continue Lamictal 200 mg daily, Prozac 40 mg daily, Abilify 10 mg daily.  She still have refills on Vistaril and trazodone.  Recommended to call us back if she has any question, concern if she feels worsening of the symptoms.  Discussed safety concerns at any time having active suicidal thoughts or homicidal thought that she need to call 911 or go to local emergency room.  I also reviewed recent  visits to the emergency room and collect information.  Time spent 25 minutes.  More than 50% of the time is spent in psychoeducation, counseling, coordination of care.  Follow-up in 2 months.   Kathlee Nations, MD 05/19/2017, 10:27 AM

## 2017-06-04 ENCOUNTER — Ambulatory Visit (HOSPITAL_COMMUNITY): Payer: Self-pay | Attending: Cardiology

## 2017-06-04 ENCOUNTER — Other Ambulatory Visit: Payer: Self-pay

## 2017-06-04 ENCOUNTER — Ambulatory Visit (INDEPENDENT_AMBULATORY_CARE_PROVIDER_SITE_OTHER): Payer: Medicaid Other | Admitting: Internal Medicine

## 2017-06-04 ENCOUNTER — Encounter: Payer: Self-pay | Admitting: Internal Medicine

## 2017-06-04 VITALS — BP 118/70 | HR 88 | Ht 65.0 in | Wt 214.0 lb

## 2017-06-04 DIAGNOSIS — I359 Nonrheumatic aortic valve disorder, unspecified: Secondary | ICD-10-CM

## 2017-06-04 DIAGNOSIS — I712 Thoracic aortic aneurysm, without rupture: Secondary | ICD-10-CM | POA: Insufficient documentation

## 2017-06-04 DIAGNOSIS — Q249 Congenital malformation of heart, unspecified: Secondary | ICD-10-CM

## 2017-06-04 DIAGNOSIS — I10 Essential (primary) hypertension: Secondary | ICD-10-CM | POA: Insufficient documentation

## 2017-06-04 MED ORDER — METOPROLOL SUCCINATE ER 25 MG PO TB24: 12.5000 mg | ORAL_TABLET | Freq: Every day | ORAL | 6 refills | Status: DC

## 2017-06-04 NOTE — Patient Instructions (Signed)
Your physician recommends that you continue on your current medications as directed. Please refer to the Current Medication list given to you today.  Your physician recommends that you return for lab work in: today (lipids)  Your physician wants you to follow-up in: 1 year with Dr. Harrington Challenger.  You will receive a reminder letter in the mail two months in advance. If you don't receive a letter, please call our office to schedule the follow-up appointment.

## 2017-06-04 NOTE — Progress Notes (Signed)
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  Coarc repair site OK   Since I saw her in Nov 2017 she has done well from a cardiac standpoint   Breathing is OK   No CP  No dizziness Notes occasional skips at night  Not during day      Outpatient Medications Prior to Visit  Medication Sig Dispense Refill  . acetaminophen (TYLENOL) 500 MG tablet Take 1,000-2,000 mg by mouth every 6 (six) hours as needed for mild pain, moderate pain, fever or headache.    . albuterol (PROVENTIL HFA;VENTOLIN HFA) 108 (90 Base) MCG/ACT inhaler Inhale 1-2 puffs into the lungs every 6 (six) hours as needed for wheezing or shortness of breath.    . ARIPiprazole (ABILIFY) 10 MG tablet Take 1 tablet (10 mg total) by mouth daily. 30 tablet 1  . CINNAMON PO Take 1 tablet by mouth daily.    . diphenhydrAMINE (BENADRYL) 25 MG tablet Take 25 mg by mouth every 6 (six) hours as needed for itching.    Marland Kitchen FLUoxetine (PROZAC) 40 MG capsule Take 1 capsule (40 mg total) by mouth daily. 30 capsule 1  . fluticasone (FLONASE) 50 MCG/ACT nasal spray USE 2 SPRAYS IN EACH NOSTRIL EVERY DAY (Patient taking differently: USE 2 SPRAYS IN EACH NOSTRIL AS NEEDED FOR CONGESTION) 16 g 5  . gabapentin (NEURONTIN) 300 MG capsule Take 600-900 mg by mouth See admin instructions. Take 600 mg in the morning then take 600mg  in the afternoon, then take 900mg  at night    . Ginger, Zingiber officinalis, (GINGER PO) Take 1 tablet by mouth daily.    Marland Kitchen HYDROCORTISONE EX Apply 1 application topically as needed (ECZEMA).    . hydrOXYzine (ATARAX/VISTARIL) 25 MG tablet  Take 1 tablet (25 mg total) by mouth daily as needed for anxiety. 30 tablet 1  . ketorolac (TORADOL) 10 MG tablet Take 10 mg by mouth as needed for migraine.    . lamoTRIgine (LAMICTAL) 200 MG tablet Take 1 tablet (200 mg total) by mouth at bedtime. 30 tablet 1  . lisdexamfetamine (VYVANSE) 30 MG capsule Take 1 capsule (30 mg total) by mouth daily. 30 capsule 0  . loratadine (CLARITIN) 10 MG tablet Take 10 mg by mouth as needed for allergies.    . medroxyPROGESTERone (DEPO-PROVERA) 150 MG/ML injection Inject 150 mg into the muscle every 3 (three) months.    . Multiple Vitamin (MULTIVITAMIN WITH MINERALS) TABS tablet Take 1 tablet by mouth daily.    . ondansetron (ZOFRAN) 4 MG tablet Take 1 tablet (4 mg total) by mouth every 8 (eight) hours as needed for nausea or vomiting. 10 tablet 0  . Pseudoephedrine-APAP-DM (DAYQUIL PO) Take 1 Dose by mouth as needed (Cough and Cold).    . traZODone (DESYREL) 50 MG tablet Take 1 tablet (50 mg total) by mouth at bedtime as needed for sleep. 30 tablet 0   No facility-administered medications prior to visit.      Allergies:   Sulfonamide derivatives and Coconut flavor  Past Medical History:  Diagnosis Date  . ADHD (attention deficit hyperactivity disorder)   . Anxiety   . Asthma    daily and prn inhalers  . Bipolar affective disorder ([place])   . Dental crowns present   . Depression   . Deviated nasal septum 05/2012  . Eczema    right leg  . Esophageal reflux   . Fibromyalgia   . Hypertension   . IBS (irritable bowel syndrome)   . Migraine headache   . Nasal turbinate hypertrophy 05/2012   bilateral  . OCD (obsessive compulsive disorder)   . TMJ syndrome     Past Surgical History:  Procedure Laterality Date  . ASD AND VSD REPAIR  1989  . CARDIAC CATHETERIZATION  06/18/2006  . CESAREAN SECTION  04/12/2009  . COARCTATION OF AORTA REPAIR  1989  . COLONOSCOPY N/A 05/20/2013   Procedure: COLONOSCOPY;  Surgeon: Danie Binder, MD;  [facility];  Service: Endoscopy;  Laterality: N/A;  115-moved to [place] notified pt  . ESOPHAGOGASTRODUODENOSCOPY  04/04/2008     Normal esophagus/Mild patchy erythema in the antrum/ Normal duodenal bulb, normal small bowel biopsy  . FLEXIBLE SIGMOIDOSCOPY  04/04/2008     Small internal hemorrhoids (poor bowel prep)  . HEMORRHOID BANDING N/A 05/20/2013   Procedure: HEMORRHOID BANDING;  Surgeon: Danie Binder, MD;  [facility];  Service: Endoscopy;  Laterality: N/A;  . NASAL SEPTOPLASTY W/ TURBINOPLASTY Bilateral 05/31/2012   Procedure: NASAL SEPTOPLASTY WITH TURBINATE REDUCTION;  Surgeon: Ascencion Dike, MD;  Location: [place];  Service: ENT;  Laterality: Bilateral;     Social History:  The patient  reports that she has been smoking cigarettes.  She started smoking about 15 years ago. She has a 5.00 pack-year smoking history. she has never used smokeless tobacco. She reports that she does not drink alcohol or use drugs.   Family History:  The patient's family history includes ADD / ADHD in her brother, brother, and sister; Alcohol abuse in her father; Anxiety disorder in her brother; Bipolar disorder in her father and paternal grandmother; Dementia in her maternal grandmother and paternal grandmother; Depression in her mother; Emphysema in her paternal grandmother; Heart disease in her maternal grandmother and paternal grandmother; Hyperlipidemia in her mother; Hypertension in her mother; Personality disorder in her father; Physical abuse in her mother; Prostate cancer in her maternal grandfather; Seizures in her daughter; Sexual abuse in her mother; Sleep apnea in her mother; Stroke in her maternal grandfather.    ROS:  Please see the history of present illness. All other systems are reviewed and  Negative to the above problem except as noted.    PHYSICAL EXAM: VS:  BP 118/70   Pulse 88   Ht 5\' 5"  (1.651 m)   Wt 214 lb (97.1 kg)   SpO2 97%   BMI 35.61 kg/m    GEN: Well nourished, well developed, in no acute distress  HEENT: normal  Neck: JVp is normal  No carotid bruits, or masses Cardiac: RRR; no murmurs, rubs, or gallops,no edema  Respiratory:  clear to auscultation bilaterally, normal work of breathing GI: soft, nontender, nondistended, + BS  No hepatomegaly  MS: no deformity Moving all extremities   Skin: warm and dry, no rash Neuro:  Strength and sensation are intact Psych: euthymic mood, full affect   EKG:  EKG is not ordered      Lipid Panel    Component Value Date/Time  CHOL  02/06/2007 0404    139        ATP III CLASSIFICATION:  <200     mg/dL   Desirable  200-239  mg/dL   Borderline High  >=240    mg/dL   High   TRIG 72 02/06/2007 0404   HDL 33 (L) 02/06/2007 0404   CHOLHDL 4.2 02/06/2007 0404   VLDL 14 02/06/2007 0404   LDLCALC  02/06/2007 0404    92        Total Cholesterol/HDL:CHD Risk Coronary Heart Disease Risk Table                     Men   Women  1/2 Average Risk   3.4   3.3      Wt Readings from Last 3 Encounters:  06/04/17 214 lb (97.1 kg)  04/06/17 200 lb (90.7 kg)  03/31/17 200 lb (90.7 kg)      ASSESSMENT AND PLAN:  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  The patient does not have concerns regarding medicines.  Signed, Dorris Carnes, MD  06/04/2017 2:43 PM    [facility] [place], [place], [place]  [address] Phone: [phone]; Fax: [phone]

## 2017-06-05 ENCOUNTER — Emergency Department (HOSPITAL_COMMUNITY)
Admission: EM | Admit: 2017-06-05 | Discharge: 2017-06-05 | Disposition: A | Discharge status: Home / Self Care | Payer: Medicaid Other | Attending: Emergency Medicine | Admitting: Emergency Medicine

## 2017-06-05 ENCOUNTER — Encounter (HOSPITAL_COMMUNITY): Payer: Self-pay

## 2017-06-05 ENCOUNTER — Other Ambulatory Visit: Payer: Self-pay

## 2017-06-05 DIAGNOSIS — I10 Essential (primary) hypertension: Secondary | ICD-10-CM | POA: Insufficient documentation

## 2017-06-05 DIAGNOSIS — F17210 Nicotine dependence, cigarettes, uncomplicated: Secondary | ICD-10-CM | POA: Insufficient documentation

## 2017-06-05 DIAGNOSIS — Z79899 Other long term (current) drug therapy: Secondary | ICD-10-CM | POA: Insufficient documentation

## 2017-06-05 DIAGNOSIS — R519 Headache, unspecified: Secondary | ICD-10-CM

## 2017-06-05 DIAGNOSIS — J45909 Unspecified asthma, uncomplicated: Secondary | ICD-10-CM | POA: Insufficient documentation

## 2017-06-05 DIAGNOSIS — R51 Headache: Secondary | ICD-10-CM | POA: Insufficient documentation

## 2017-06-05 LAB — LIPID PANEL
CHOLESTEROL TOTAL: 181 mg/dL (ref 100–199)
Chol/HDL Ratio: 3.7 ratio (ref 0.0–4.4)
HDL: 49 mg/dL (ref >39)
LDL Calculated: 108 mg/dL — ABNORMAL HIGH (ref 0–99)
Triglycerides: 120 mg/dL (ref 0–149)
VLDL CHOLESTEROL CAL: 24 mg/dL (ref 5–40)

## 2017-06-05 MED ORDER — METOCLOPRAMIDE HCL 5 MG/ML IJ SOLN
10.0000 mg | Freq: Once | INTRAMUSCULAR | Status: AC
  Administered 2017-06-05: 10 mg via INTRAVENOUS
  Filled 2017-06-05: qty 2

## 2017-06-05 MED ORDER — DEXAMETHASONE SODIUM PHOSPHATE 10 MG/ML IJ SOLN
10.0000 mg | Freq: Once | INTRAMUSCULAR | Status: AC
  Administered 2017-06-05: 10 mg via INTRAVENOUS
  Filled 2017-06-05: qty 1

## 2017-06-05 MED ORDER — KETOROLAC TROMETHAMINE 30 MG/ML IJ SOLN
30.0000 mg | Freq: Once | INTRAMUSCULAR | Status: AC
  Administered 2017-06-05: 30 mg via INTRAVENOUS
  Filled 2017-06-05: qty 1

## 2017-06-05 MED ORDER — SODIUM CHLORIDE 0.9 % IV BOLUS (SEPSIS)
1000.0000 mL | Freq: Once | INTRAVENOUS | Status: AC
  Administered 2017-06-05: 1000 mL via INTRAVENOUS

## 2017-06-05 NOTE — ED Triage Notes (Signed)
Patient c/o migraine x 5 days. Patient states she has been taking OTC Tylenol with no relief. Patient c/o light sensitivity and nausea.

## 2017-06-05 NOTE — ED Provider Notes (Signed)
[facility] DEPT Provider Note   CSN: [id number] Arrival date & time: 06/05/17  3614     History   Chief Complaint Chief Complaint  Patient presents with  . Migraine  . feels like oral swelling    HPI Kristin Stephens is a 32 y.o. female with history of migraines, asthma, hypertension who presents with a 5-day history of headache.  She has had associated photophobia and nausea.  She is also had soreness in her neck and feels like she slept wrong.  She denies any fever, vision changes, numbness or tingling.  She reports her headache began gradually.  She has been taking Tylenol at home without relief.  She does not take any prophylactic medication, she states she does not get migraines very often.  She denies fever.  She denies any other symptoms including chest pain, shortness of breath, vomiting, or abdominal pain.  HPI  Past Medical History:  Diagnosis Date  . ADHD (attention deficit hyperactivity disorder)   . Anxiety   . Asthma    daily and prn inhalers  . Bipolar affective disorder ([place])   . Dental crowns present   . Depression   . Deviated nasal septum 05/2012  . Eczema    right leg  . Esophageal reflux   . Fibromyalgia   . Hypertension   . IBS (irritable bowel syndrome)   . Migraine headache   . Nasal turbinate hypertrophy 05/2012   bilateral  . OCD (obsessive compulsive disorder)   . TMJ syndrome     Patient Active Problem List   Diagnosis Date Noted  . Intentional overdose of drug in tablet form ([place]) 01/26/2017  . Bipolar 1 disorder, depressed, severe ([place]) 01/26/2017  . Bipolar 2 disorder ([place]) 05/16/2014  . Internal hemorrhoids with complication 43/15/4008  . Vasomotor rhinitis 04/15/2013  . Gastritis without bleeding 03/10/2013  . Anal fissure 03/10/2013  . Obsessive compulsive disorder 03/07/2013  . Fibromyalgia 02/06/2013  . Detachment of glenoid labrum 01/13/2013  . Difficulty in walking(719.7) 02/18/2012  . Seasonal  affective disorder ([place]) 11/25/2011  . Asthma, intrinsic 08/28/2010  . ATTENTION DEFICIT DISORDER, ADULT 01/19/2008  . RHINITIS MEDICAMENTOSA 12/08/2007  . ALLERGIC RHINITIS 12/08/2007  . IBS 12/08/2007  . ECZEMA 12/08/2007  . 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 Migraine headache 04/24/2007  . Essential hypertension 04/24/2007  . ASVD 04/24/2007  . GERD 04/24/2007  . VSD 04/24/2007  . 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 ASD AND VSD REPAIR  1989  . CARDIAC CATHETERIZATION  06/18/2006  . CESAREAN SECTION  04/12/2009  . COARCTATION OF AORTA REPAIR  1989  . COLONOSCOPY N/A 05/20/2013   Procedure: COLONOSCOPY;  Surgeon: Danie Binder, MD;  Location: [facility];  Service: Endoscopy;  Laterality: N/A;  115-moved to [place] notified pt  . ESOPHAGOGASTRODUODENOSCOPY  04/04/2008     Normal esophagus/Mild patchy erythema in the antrum/ Normal duodenal bulb, normal small bowel biopsy  . FLEXIBLE SIGMOIDOSCOPY  04/04/2008     Small internal hemorrhoids (poor bowel prep)  . HEMORRHOID BANDING N/A 05/20/2013   Procedure: HEMORRHOID BANDING;  Surgeon: Danie Binder, MD;  Location: [facility];  Service: Endoscopy;  Laterality: N/A;  . NASAL SEPTOPLASTY W/ TURBINOPLASTY Bilateral 05/31/2012   Procedure: NASAL SEPTOPLASTY WITH TURBINATE REDUCTION;  Surgeon: Ascencion Dike, MD;  Location: [facility];  Service: ENT;  Laterality: Bilateral;    OB History  Gravida Para Term Preterm AB Living   1 1 1     1    SAB TAB Ectopic Multiple Live Births                   Home Medications    Prior to Admission medications   Medication Sig Start Date End Date Taking? Authorizing Provider  acetaminophen (TYLENOL) 500 MG tablet Take 1,000-2,000 mg by mouth every 6 (six) hours as needed for mild pain, moderate pain, fever or headache.   Yes [provider]  albuterol (PROVENTIL HFA;VENTOLIN HFA) 108 (90 Base) MCG/ACT  inhaler Inhale 1-2 puffs into the lungs every 6 (six) hours as needed for wheezing or shortness of breath.   Yes [provider]  ARIPiprazole (ABILIFY) 10 MG tablet Take 1 tablet (10 mg total) by mouth daily. 05/19/17  Yes Arfeen, Arlyce Harman, MD  diphenhydrAMINE (BENADRYL) 25 MG tablet Take 25 mg by mouth every 6 (six) hours as needed for itching.   Yes [provider]  FLUoxetine (PROZAC) 40 MG capsule Take 1 capsule (40 mg total) by mouth daily. 05/19/17  Yes Arfeen, Arlyce Harman, MD  fluticasone (FLONASE) 50 MCG/ACT nasal spray USE 2 SPRAYS IN EACH NOSTRIL EVERY DAY Patient taking differently: USE 2 SPRAYS IN EACH NOSTRIL AS NEEDED FOR CONGESTION 02/02/17  Yes Mikey Kirschner, MD  gabapentin (NEURONTIN) 300 MG capsule Take 600-900 mg by mouth See admin instructions. Take 600 mg in the morning then take 600mg  in the afternoon, then take 900mg  at night   Yes [provider]  Ginger, Zingiber officinalis, (GINGER PO) Take 1 tablet by mouth as needed (stomach discomfort).    Yes [provider]  hydrOXYzine (ATARAX/VISTARIL) 25 MG tablet Take 1 tablet (25 mg total) by mouth daily as needed for anxiety. 03/18/17  Yes Arfeen, Arlyce Harman, MD  ketorolac (TORADOL) 10 MG tablet Take 10 mg by mouth as needed for migraine.   Yes [provider]  lamoTRIgine (LAMICTAL) 200 MG tablet Take 1 tablet (200 mg total) by mouth at bedtime. 05/19/17  Yes Arfeen, Arlyce Harman, MD  lisdexamfetamine (VYVANSE) 30 MG capsule Take 1 capsule (30 mg total) by mouth daily. 05/19/17  Yes Arfeen, Arlyce Harman, MD  loratadine (CLARITIN) 10 MG tablet Take 10 mg by mouth as needed for allergies.   Yes [provider]  medroxyPROGESTERone (DEPO-PROVERA) 150 MG/ML injection Inject 150 mg into the muscle every 3 (three) months.   Yes [provider]  metoprolol succinate (TOPROL XL) 25 MG 24 hr tablet Take 0.5 tablets (12.5 mg total) by mouth at bedtime. For palpitations. 06/04/17  Yes [name], MD    Multiple Vitamin (MULTIVITAMIN WITH MINERALS) TABS tablet Take 1 tablet by mouth daily.   Yes [provider]  ondansetron (ZOFRAN) 4 MG tablet Take 1 tablet (4 mg total) by mouth every 8 (eight) hours as needed for nausea or vomiting. 02/20/17  Yes Pisciotta, Elmyra Ricks, PA-C  traZODone (DESYREL) 50 MG tablet Take 1 tablet (50 mg total) by mouth at bedtime as needed for sleep. 03/18/17  Yes Arfeen, Arlyce Harman, MD  CINNAMON PO Take 1 tablet by mouth daily.    [provider]  HYDROCORTISONE EX Apply 1 application topically as needed (ECZEMA).    [provider]    Family History Family History  Problem Relation Age of Onset  . Hyperlipidemia Mother   . Hypertension Mother   . Depression Mother   . Physical abuse Mother   .  Sexual abuse Mother        possibly  . Sleep apnea Mother   . Emphysema Paternal Grandmother   . Heart disease Paternal Grandmother   . Bipolar disorder Paternal Grandmother   . Dementia Paternal Grandmother   . Heart disease Maternal Grandmother   . Dementia Maternal Grandmother   . Stroke Maternal Grandfather   . Prostate cancer Maternal Grandfather   . Personality disorder Father   . Bipolar disorder Father   . Alcohol abuse Father   . ADD / ADHD Sister   . Anxiety disorder Brother   . ADD / ADHD Brother   . ADD / ADHD Brother   . Seizures Daughter   . Drug abuse Neg Hx   . OCD Neg Hx   . Paranoid behavior Neg Hx   . Schizophrenia Neg Hx     Social History Social History   Tobacco Use  . Smoking status: Current Every Day Smoker    Packs/day: 0.50    Years: 10.00    Pack years: 5.00    Types: Cigarettes    Start date: 02/27/2002  . Smokeless tobacco: Never Used  . Tobacco comment: has cut back to less than 1/2 a pack and trying to quit  Substance Use Topics  . 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 Drug use: No    Comment: marijuana and pain pills;  12/08/07-smoked x 1 month ago- this was the first time in 2.years.  Morphine Sulfate and oxycontin - none since age 29     Allergies   Sulfonamide derivatives and Coconut flavor   Review of Systems Review of Systems  Constitutional: Negative for chills and fever.  HENT: Negative for facial swelling and sore throat.   Eyes: Positive for photophobia. Negative for visual disturbance.  Respiratory: Negative for shortness of breath.   Cardiovascular: Negative for chest pain.  Gastrointestinal: Positive for nausea. Negative for abdominal pain and vomiting.  Genitourinary: Negative for dysuria.  Musculoskeletal: Negative for back pain.  Skin: Negative for rash and wound.  Neurological: Positive for headaches.  Psychiatric/Behavioral: The patient is not nervous/anxious.      Physical Exam Updated Vital Signs BP 113/65   Pulse 80   Temp 98.3 F (36.8 C) (Oral)   Resp 18   Ht 5\' 5"  (1.651 m)   Wt 97.1 kg (214 lb)   LMP 05/29/2017 (Approximate)   SpO2 99%   BMI 35.61 kg/m   Physical Exam  Constitutional: She appears well-developed and well-nourished. No distress.  HENT:  Head: Normocephalic and atraumatic.  Mouth/Throat: Oropharynx is clear and moist. No oropharyngeal exudate.  Eyes: Conjunctivae are normal. Pupils are equal, round, and reactive to light. Right eye exhibits no discharge. Left eye exhibits no discharge. No scleral icterus.  Neck: Normal range of motion. Neck supple. Muscular tenderness (bilateral) present. No spinous process tenderness present. No neck rigidity. Normal range of motion present. No thyromegaly present.  Cardiovascular: Normal rate, regular rhythm, normal heart sounds and intact distal pulses. Exam reveals no gallop and no friction rub.  No murmur heard. Pulmonary/Chest: Effort normal and breath sounds normal. No stridor. No respiratory distress. She has no wheezes. She has no rales.  Abdominal: Soft. Bowel sounds are normal. She exhibits no  distension. There is no tenderness. There is no rebound and no guarding.  Musculoskeletal: She exhibits no edema.  Lymphadenopathy:    She has no  cervical adenopathy.  Neurological: She is alert. Coordination normal.  CN 3-12 intact; normal sensation throughout; 5/5 strength in all 4 extremities; equal bilateral grip strength  Skin: Skin is warm and dry. No rash noted. She is not diaphoretic. No pallor.  Psychiatric: She has a normal mood and affect.  Nursing note and vitals reviewed.    ED Treatments / Results  Labs (all labs ordered are listed, but only abnormal results are displayed) Labs Reviewed - No data to display  EKG  EKG Interpretation None       Radiology No results found.  Procedures Procedures (including critical care time)  Medications Ordered in ED Medications  ketorolac (TORADOL) 30 MG/ML injection 30 mg (30 mg Intravenous Given 06/05/17 1047)  sodium chloride 0.9 % bolus 1,000 mL (0 mLs Intravenous Stopped 06/05/17 1157)  metoCLOPramide (REGLAN) injection 10 mg (10 mg Intravenous Given 06/05/17 1047)  dexamethasone (DECADRON) injection 10 mg (10 mg Intravenous Given 06/05/17 1047)     Initial Impression / Assessment and Plan / ED Course  I have reviewed the triage vital signs and the nursing notes.  Pertinent labs & imaging results that were available during my care of the patient were reviewed by me and considered in my medical decision making (see chart for details).     Pt HA treated and improved while in ED with migraine cocktail.  Presentation is like pts typical HA and non concerning for [facility], ICH, Meningitis, or temporal arteritis.  Suspect component of tension headache, as patient has been under increased stress lately.  We discussed stretching her neck and heating pad in addition.  Pt is afebrile with no focal neuro deficits, nuchal rigidity, or change in vision. Pt is to follow up with PCP to discuss prophylactic medication.  Return precautions  discussed.  Patient understands and agrees with plan.  Patient vitals stable throughout ED course and discharged in satisfactory condition.  Final Clinical Impressions(s) / ED Diagnoses   Final diagnoses:  Bad headache    ED Discharge Orders    None       Frederica Kuster, PA-C 06/05/17 1422    Quintella Reichert, MD 06/10/17 1246

## 2017-06-05 NOTE — Discharge Instructions (Signed)
Please follow-up with your doctor for recheck and further evaluation of your headaches.  Attempt the stretches we discussed for your neck 3 times daily.  You can also use a heating pad 3-4 times daily alternating 20 minutes on, 20 minutes off.

## 2017-06-05 NOTE — ED Notes (Signed)
Discharge instructions reviewed with patient. Patient verbalizes understanding. VSS.   

## 2017-07-03 ENCOUNTER — Other Ambulatory Visit (HOSPITAL_COMMUNITY): Payer: Self-pay | Admitting: Psychiatry

## 2017-07-03 DIAGNOSIS — F3131 Bipolar disorder, current episode depressed, mild: Secondary | ICD-10-CM

## 2017-07-15 ENCOUNTER — Ambulatory Visit (INDEPENDENT_AMBULATORY_CARE_PROVIDER_SITE_OTHER): Payer: Self-pay | Admitting: Psychiatry

## 2017-07-15 ENCOUNTER — Encounter (HOSPITAL_COMMUNITY): Payer: Self-pay | Admitting: Psychiatry

## 2017-07-15 DIAGNOSIS — F900 Attention-deficit hyperactivity disorder, predominantly inattentive type: Secondary | ICD-10-CM

## 2017-07-15 DIAGNOSIS — F3131 Bipolar disorder, current episode depressed, mild: Secondary | ICD-10-CM

## 2017-07-15 DIAGNOSIS — Z811 Family history of alcohol abuse and dependence: Secondary | ICD-10-CM

## 2017-07-15 DIAGNOSIS — Z818 Family history of other mental and behavioral disorders: Secondary | ICD-10-CM

## 2017-07-15 DIAGNOSIS — Z810 Family history of intellectual disabilities: Secondary | ICD-10-CM

## 2017-07-15 DIAGNOSIS — R450 Nervousness: Secondary | ICD-10-CM

## 2017-07-15 DIAGNOSIS — R51 Headache: Secondary | ICD-10-CM

## 2017-07-15 DIAGNOSIS — F419 Anxiety disorder, unspecified: Secondary | ICD-10-CM

## 2017-07-15 DIAGNOSIS — F17210 Nicotine dependence, cigarettes, uncomplicated: Secondary | ICD-10-CM

## 2017-07-15 DIAGNOSIS — M2550 Pain in unspecified joint: Secondary | ICD-10-CM

## 2017-07-15 MED ORDER — ARIPIPRAZOLE 15 MG PO TABS: 15.0000 mg | ORAL_TABLET | Freq: Every day | ORAL | 1 refills | Status: DC

## 2017-07-15 MED ORDER — TRAZODONE HCL 50 MG PO TABS: 50.0000 mg | ORAL_TABLET | Freq: Every evening | ORAL | 0 refills | Status: DC | PRN

## 2017-07-15 MED ORDER — LISDEXAMFETAMINE DIMESYLATE 40 MG PO CAPS: 40.0000 mg | ORAL_CAPSULE | Freq: Every day | ORAL | 0 refills | Status: DC

## 2017-07-15 MED ORDER — FLUOXETINE HCL 40 MG PO CAPS: 40.0000 mg | ORAL_CAPSULE | Freq: Every day | ORAL | 1 refills | Status: DC

## 2017-07-15 MED ORDER — HYDROXYZINE HCL 25 MG PO TABS: 25.0000 mg | ORAL_TABLET | Freq: Every day | ORAL | 1 refills | Status: DC | PRN

## 2017-07-15 MED ORDER — LAMOTRIGINE 200 MG PO TABS: 200.0000 mg | ORAL_TABLET | Freq: Every day | ORAL | 1 refills | Status: DC

## 2017-07-15 NOTE — Progress Notes (Signed)
[facility] MD/PA/NP OP Progress Note  07/15/2017 11:35 AM Kristin Stephens  MRN:  [id number]  Chief Complaint: I do not think Kristin Stephens dose is strong enough.  HPI: Kristin Stephens came for her follow-up appointment with her mother.  Was recently seen in the emergency room for headaches and she was given pain medication.  Her headaches are much better.  On her last visit we started her on low-dose Kristin Stephens because she was complaining of poor attention, concentration and difficulty multitasking.  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  Today she brought her mother who has some concerns about patient's behavior.  Mother told that she is been getting money from her uncle and mother.  Mother is not sure where else on his spending money but Kristin Stephens replied that she is putting money on food and utilities. Patient told that she is been remorseful about her past because she has not done good things and for that reason she is unable to see her daughter Kristin Stephens who lives with patient's sister in [place].  Patient is trying to see more frequently but her sister does not let her because family believed that patient is not stable financially.  Her mother endorsed that she is trying to help Kristin Stephens but also realize that Kristin Stephens sister has a lot of concern.  Her mother recall that in the past patient left her 73-1/2-year-old daughter to watch movie by herself.  She also recall history of drinking.  Her mother is concerned that once she is financially stable then she may get more frequent visits.  Patient understand that she is taking baby steps and she is pleased that she is able to get a job and hoping to save some money.  Patient is still very emotional, easily tearful and having crying spells.  Some nights she does not sleep good.  She still have difficulty multitasking.  However patient denies any suicidal thoughts or homicidal thought.  She admitted that she need to go back in  therapy.  She used to see Kristin Stephens but then she stopped seeing her.  She does not take trazodone every day.  Most of the time her sleep is okay.  She is compliant with Lamictal, Prozac, Abilify.  She takes Vistaril for severe anxiety.  Her appetite is okay.  Patient denies any suicidal thoughts or any hallucination or any paranoia.  Her energy level is good.    Visit Diagnosis:    ICD-10-CM   1. Bipolar affective disorder, currently depressed, mild (HCC) F31.31 ARIPiprazole (ABILIFY) 15 MG tablet    FLUoxetine (PROZAC) 40 MG capsule    traZODone (DESYREL) 50 MG tablet    lamoTRIgine (LAMICTAL) 200 MG tablet    hydrOXYzine (ATARAX/VISTARIL) 25 MG tablet  2. Attention deficit hyperactivity disorder (ADHD), predominantly inattentive type F90.0 lisdexamfetamine (Kristin Stephens) 40 MG capsule    Past Psychiatric History: Reviewed Patient has history of mood swing, depression, anger and she was diagnosed with bipolar and ADD. She has a history of self abusive behavior by cutting her wrist with a razor. In the past she had tried Wellbutrin but limited response. Patient finished intensive outpatient program in 2016.Shee was admitted to [facility] in October 2018 after taking overdose on medication and then slashing her wrist. Her Adderall, Ativan and gabapentin was discontinued  Past Medical History:  Past Medical History:  Diagnosis Date  . ADHD (attention deficit hyperactivity disorder)   . Anxiety   .  Asthma    daily and prn inhalers  . Bipolar affective disorder (Kristin Stephens)   . Dental crowns present   . Depression   . Deviated nasal septum 05/2012  . Eczema    right leg  . Esophageal reflux   . Fibromyalgia   . Hypertension   . IBS (irritable bowel syndrome)   . Migraine headache   . Nasal turbinate hypertrophy 05/2012   bilateral  . OCD (obsessive compulsive disorder)   . TMJ syndrome     Past Surgical History:  Procedure Laterality Date  . ASD AND VSD REPAIR  1989  .  CARDIAC CATHETERIZATION  06/18/2006  . CESAREAN SECTION  04/12/2009  . COARCTATION OF AORTA REPAIR  1989  . COLONOSCOPY N/A 05/20/2013   Procedure: COLONOSCOPY;  Surgeon: Kristin Binder, MD;  [facility];  Service: Endoscopy;  Laterality: N/A;  115-moved to Kristin Stephens notified pt  . ESOPHAGOGASTRODUODENOSCOPY  04/04/2008     Normal esophagus/Mild patchy erythema in the antrum/ Normal duodenal bulb, normal small bowel biopsy  . FLEXIBLE SIGMOIDOSCOPY  04/04/2008     Small internal hemorrhoids (poor bowel prep)  . HEMORRHOID BANDING N/A 05/20/2013   Procedure: HEMORRHOID BANDING;  Surgeon: Kristin Binder, MD;  Location: [facility];  Service: Endoscopy;  Laterality: N/A;  . NASAL SEPTOPLASTY W/ TURBINOPLASTY Bilateral 05/31/2012   Procedure: NASAL SEPTOPLASTY WITH TURBINATE REDUCTION;  Surgeon: Ascencion Dike, MD;  Location: [facility];  Service: ENT;  Laterality: Bilateral;    Family Psychiatric History: Reviewed  Family History:  Family History  Problem Relation Age of Onset  . Hyperlipidemia Mother   . Hypertension Mother   . Depression Mother   . Physical abuse Mother   . Sexual abuse Mother        possibly  . Sleep apnea Mother   . Emphysema Paternal Grandmother   . Heart disease Paternal Grandmother   . Bipolar disorder Paternal Grandmother   . Dementia Paternal Grandmother   . Heart disease Maternal Grandmother   . Dementia Maternal Grandmother   . Stroke Maternal Grandfather   . Prostate cancer Maternal Grandfather   . Personality disorder Father   . Bipolar disorder Father   . Alcohol abuse Father   . ADD / ADHD Sister   . Anxiety disorder Brother   . ADD / ADHD Brother   . ADD / ADHD Brother   . Seizures Daughter   . Drug abuse Neg Hx   . OCD Neg Hx   . Paranoid behavior Neg Hx   . Schizophrenia Neg Hx     Social History:  Social History   Socioeconomic History  . Marital status: Single    Spouse name: Not on file  . Number of  children: 1  . Years of education: GED  . Highest education level: Not on file  Occupational History  . Occupation: Engineer, water  Social Needs  . Financial resource strain: Not on file  . Food insecurity:    Worry: Not on file    Inability: Not on file  . Transportation needs:    Medical: Not on file    Non-medical: Not on file  Tobacco Use  . Smoking status: Current Every Day Smoker    Packs/day: 0.50    Years: 10.00    Pack years: 5.00    Types: Cigarettes    Start date: 02/27/2002  . Smokeless tobacco: Never Used  . Tobacco comment: has cut back to less than  1/2 a pack and trying to quit  Substance and Sexual Activity  . 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 Drug use: No    Comment: marijuana and pain pills; 12/08/07-smoked x 1 month ago- this was the first time in 2.years.  Morphine Sulfate and oxycontin - none since age 30  . Sexual activity: Not Currently    Birth control/protection: Injection, Spermicide  Lifestyle  . Physical activity:    Days per week: Not on file    Minutes per session: Not on file  . Stress: Not on file  Relationships  . Social connections:    Talks on phone: Not on file    Gets together: Not on file    Attends religious service: Not on file    Active member of club or organization: Not on file    Attends meetings of clubs or organizations: Not on file    Relationship status: Not on file  Other Topics Concern  . Not on file  Social History Narrative   Lives with 3 roommates   LIKES TO WATCH DR. PIMPLE POPPER. AT AGE 61 LIKED TO PICK HER NOSE AND EAT THE DRIED MUCOUS.    Caffeine: coffee, soda, occasional Red Bull    Allergies:  Allergies  Allergen Reactions  . Sulfonamide Derivatives Swelling    SWELLING OF EYES WITH OPHTHALMIC SULFA  . Coconut Flavor Rash    Metabolic Disorder Labs: Recent Results (from the past 2160 hour(s))  Lipid panel     Status: Abnormal   Collection  Time: 06/04/17  3:10 PM  Result Value Ref Range   Cholesterol, Total 181 100 - 199 mg/dL   Triglycerides 120 0 - 149 mg/dL   HDL 49 >39 mg/dL   VLDL Cholesterol Cal 24 5 - 40 mg/dL   LDL Calculated 108 (H) 0 - 99 mg/dL   Chol/HDL Ratio 3.7 0.0 - 4.4 ratio    Comment:                                   T. Chol/HDL Ratio                                             Men  Women                               1/2 Avg.Risk  3.4    3.3                                   Avg.Risk  5.0    4.4                                2X Avg.Risk  9.6    7.1                                3X Avg.Risk 23.4   11.0    Lab Results  Component Value Date   HGBA1C 5.0 07/17/2014  MPG 97 08/28/2011   No results found for: PROLACTIN Lab Results  Component Value Date   CHOL 181 06/04/2017   TRIG 120 06/04/2017   HDL 49 06/04/2017   CHOLHDL 3.7 06/04/2017   VLDL 14 02/06/2007   LDLCALC 108 (H) 06/04/2017   LDLCALC  02/06/2007    92        Total Cholesterol/HDL:CHD Risk Coronary Heart Disease Risk Table                     Men   Women  1/2 Average Risk   3.4   3.3   Lab Results  Component Value Date   TSH 1.299 03/31/2008   TSH 1.424 Test methodology is 3rd generation TSH 02/05/2007    Therapeutic Level Labs: No results found for: LITHIUM No results found for: VALPROATE No components found for:  CBMZ  Current Medications: Current Outpatient Medications  Medication Sig Dispense Refill  . acetaminophen (TYLENOL) 500 MG tablet Take 1,000-2,000 mg by mouth every 6 (six) hours as needed for mild pain, moderate pain, fever or headache.    . albuterol (PROVENTIL HFA;VENTOLIN HFA) 108 (90 Base) MCG/ACT inhaler Inhale 1-2 puffs into the lungs every 6 (six) hours as needed for wheezing or shortness of breath.    . ARIPiprazole (ABILIFY) 15 MG tablet Take 1 tablet (15 mg total) by mouth daily. 30 tablet 1  . diphenhydrAMINE (BENADRYL) 25 MG tablet Take 25 mg by mouth every 6 (six) hours as needed for itching.     Marland Kitchen FLUoxetine (PROZAC) 40 MG capsule Take 1 capsule (40 mg total) by mouth daily. 30 capsule 1  . fluticasone (FLONASE) 50 MCG/ACT nasal spray USE 2 SPRAYS IN EACH NOSTRIL EVERY DAY (Patient taking differently: USE 2 SPRAYS IN EACH NOSTRIL AS NEEDED FOR CONGESTION) 16 g 5  . gabapentin (NEURONTIN) 300 MG capsule Take 600-900 mg by mouth See admin instructions. Take 600 mg in the morning then take 600mg  in the afternoon, then take 900mg  at night    . HYDROCORTISONE EX Apply 1 application topically as needed (ECZEMA).    . hydrOXYzine (ATARAX/VISTARIL) 25 MG tablet Take 1 tablet (25 mg total) by mouth daily as needed for anxiety. 30 tablet 1  . ketorolac (TORADOL) 10 MG tablet Take 10 mg by mouth as needed for migraine.    . lamoTRIgine (LAMICTAL) 200 MG tablet Take 1 tablet (200 mg total) by mouth at bedtime. 30 tablet 1  . lisdexamfetamine (Kristin Stephens) 40 MG capsule Take 1 capsule (40 mg total) by mouth daily. 30 capsule 0  . medroxyPROGESTERone (DEPO-PROVERA) 150 MG/ML injection Inject 150 mg into the muscle every 3 (three) months.    . metoprolol succinate (TOPROL XL) 25 MG 24 hr tablet Take 0.5 tablets (12.5 mg total) by mouth at bedtime. For palpitations. 15 tablet 6  . Multiple Vitamin (MULTIVITAMIN WITH MINERALS) TABS tablet Take 1 tablet by mouth daily.    . ondansetron (ZOFRAN) 4 MG tablet Take 1 tablet (4 mg total) by mouth every 8 (eight) hours as needed for nausea or vomiting. 10 tablet 0  . traZODone (DESYREL) 50 MG tablet Take 1 tablet (50 mg total) by mouth at bedtime as needed for sleep. 30 tablet 0  . loratadine (CLARITIN) 10 MG tablet Take 10 mg by mouth as needed for allergies.     No current facility-administered medications for this visit.      Musculoskeletal: Strength & Muscle Tone: within normal limits Gait & Station: normal Patient  leans: N/A  Psychiatric Specialty Exam: Review of Systems  Constitutional: Negative.   HENT: Negative.   Respiratory: Negative.    Musculoskeletal: Positive for joint pain.  Skin: Negative.   Neurological: Positive for headaches. Negative for tremors.  Psychiatric/Behavioral: The patient is nervous/anxious.     Blood pressure 122/68, pulse 74, height 5\' 5"  (1.651 m), weight 213 lb (96.6 kg).Body mass index is 35.45 kg/m.  General Appearance: Casual and Emotional and easily tearful  Eye Contact:  Good  Speech:  Clear and Coherent  Volume:  Normal  Mood:  Anxious and Dysphoric  Affect:  Congruent  Thought Process:  Goal Directed  Orientation:  Full (Time, Place, and Person)  Thought Content: Rumination   Suicidal Thoughts:  No  Homicidal Thoughts:  No  Memory:  Immediate;   Good Recent;   Good Remote;   Good  Judgement:  Good  Insight:  Good  Psychomotor Activity:  Normal  Concentration:  Concentration: Fair and Attention Span: Fair  Recall:  Good  Fund of Knowledge: Good  Language: Good  Akathisia:  No  Handed:  Right  AIMS (if indicated): not done  Assets:  Communication Skills Desire for Improvement Housing Physical Health Resilience  ADL's:  Intact  Cognition: WNL  Sleep:  Fair   Screenings: AIMS     Admission (Discharged) from 01/26/2017 in [facility]  AIMS Total Score  0    AUDIT     Admission (Discharged) from 01/26/2017 in [facility]  Alcohol Use Disorder Identification Test Final Score (AUDIT)  0    Mini-Mental     Office Visit from 07/31/2016 in [facility]  Total Score (max 30 points )  29    PHQ2-9     Office Visit from 03/02/2017 in [facility]  PHQ-2 Total Score  2  PHQ-9 Total Score  11       Assessment and Plan: Other disorder type I.  Generalized anxiety disorder.  Attention deficit disorder, inattentive type.  Reviewed recent blood work results and visit to the emergency room.  Her headaches are much better.  I had a long discussion with the patient and her mother.   Recommended that family should discuss about Gabreille's visiting privilege with her daughter among himself.  Patient is very attached to her daughter and she like to have more frequent visits.  Mother is willing to support her but she also wants that Seher should take more responsibility financially.  She is pleased that she is able to get a job and hoping to rely less on mother's financial help.  I also encourage she should see Kristin Stephens again for CBT.  I encouraged to continue AA meeting as patient has been sober from alcohol for a while.  I would also increase Kristin Stephens 40 mg to help her attention and focus.  I would also increase Abilify 15 mg to help her mood lability.  Continue Prozac 40 mg daily, Lamictal 200 mg daily and take Vistaril and trazodone as needed.  I recommended to call us back if she has any question, concern if she feels worsening of the symptoms.  We discussed safety concerns at any time having active suicidal thoughts or homicidal thought that she need to call 911 or go to local emergency room.  I discussed medication side effect especially increased stimulant can cause manic symptoms and patient is aware about it.  Follow-up in 2 months.  Time spent 30 minutes.  Kathlee Nations, MD 07/15/2017, 11:36 AM

## 2017-07-23 ENCOUNTER — Ambulatory Visit (HOSPITAL_COMMUNITY): Payer: Medicaid Other | Admitting: Psychiatry

## 2017-08-11 ENCOUNTER — Other Ambulatory Visit (HOSPITAL_COMMUNITY): Payer: Self-pay | Admitting: Psychiatry

## 2017-08-11 DIAGNOSIS — F3131 Bipolar disorder, current episode depressed, mild: Secondary | ICD-10-CM

## 2017-08-25 ENCOUNTER — Telehealth (HOSPITAL_COMMUNITY): Payer: Self-pay

## 2017-08-25 ENCOUNTER — Other Ambulatory Visit (HOSPITAL_COMMUNITY): Payer: Self-pay | Admitting: Psychiatry

## 2017-08-25 DIAGNOSIS — F900 Attention-deficit hyperactivity disorder, predominantly inattentive type: Secondary | ICD-10-CM

## 2017-08-25 MED ORDER — LISDEXAMFETAMINE DIMESYLATE 40 MG PO CAPS: 40.0000 mg | ORAL_CAPSULE | Freq: Every day | ORAL | 0 refills | Status: DC

## 2017-08-25 NOTE — Telephone Encounter (Signed)
Refill done.  Please call patient to pick up the medication. 

## 2017-08-25 NOTE — Telephone Encounter (Signed)
Called and let patient know it was ready for pick up at the pharmacy.

## 2017-08-25 NOTE — Telephone Encounter (Signed)
Patient is calling for a refill on her Vyvanse, she has a follow up appointment next month. Patient uses [facility]. Thank you

## 2017-09-14 ENCOUNTER — Ambulatory Visit (HOSPITAL_COMMUNITY): Payer: Medicaid Other | Admitting: Psychiatry

## 2017-09-21 ENCOUNTER — Other Ambulatory Visit (HOSPITAL_COMMUNITY): Payer: Self-pay | Admitting: Psychiatry

## 2017-09-21 ENCOUNTER — Other Ambulatory Visit: Payer: Self-pay | Admitting: Family Medicine

## 2017-09-21 DIAGNOSIS — F900 Attention-deficit hyperactivity disorder, predominantly inattentive type: Secondary | ICD-10-CM

## 2017-09-21 DIAGNOSIS — F3131 Bipolar disorder, current episode depressed, mild: Secondary | ICD-10-CM

## 2017-09-25 ENCOUNTER — Other Ambulatory Visit (HOSPITAL_COMMUNITY): Payer: Self-pay | Admitting: Psychiatry

## 2017-09-25 ENCOUNTER — Telehealth (HOSPITAL_COMMUNITY): Payer: Self-pay

## 2017-09-25 DIAGNOSIS — F3131 Bipolar disorder, current episode depressed, mild: Secondary | ICD-10-CM

## 2017-09-25 DIAGNOSIS — F900 Attention-deficit hyperactivity disorder, predominantly inattentive type: Secondary | ICD-10-CM

## 2017-09-25 MED ORDER — LISDEXAMFETAMINE DIMESYLATE 40 MG PO CAPS: 40.0000 mg | ORAL_CAPSULE | Freq: Every day | ORAL | 0 refills | Status: DC

## 2017-09-25 MED ORDER — TRAZODONE HCL 50 MG PO TABS: 50.0000 mg | ORAL_TABLET | Freq: Every evening | ORAL | 0 refills | Status: DC | PRN

## 2017-09-25 MED ORDER — LAMOTRIGINE 200 MG PO TABS: 200.0000 mg | ORAL_TABLET | Freq: Every day | ORAL | 0 refills | Status: DC

## 2017-09-25 MED ORDER — ARIPIPRAZOLE 15 MG PO TABS: 15.0000 mg | ORAL_TABLET | Freq: Every day | ORAL | 0 refills | Status: DC

## 2017-09-25 MED ORDER — FLUOXETINE HCL 40 MG PO CAPS: 40.0000 mg | ORAL_CAPSULE | Freq: Every day | ORAL | 0 refills | Status: DC

## 2017-09-25 NOTE — Telephone Encounter (Signed)
Patient is calling for a refill on her Vyvanse, she has to reschedule her appointment to July. Please review and advise, thank you

## 2017-09-25 NOTE — Telephone Encounter (Signed)
Medication called in 

## 2017-10-13 ENCOUNTER — Ambulatory Visit (HOSPITAL_COMMUNITY): Payer: Medicaid Other | Admitting: Psychiatry

## 2017-10-20 ENCOUNTER — Emergency Department (HOSPITAL_COMMUNITY)
Admission: EM | Admit: 2017-10-20 | Discharge: 2017-10-20 | Disposition: A | Discharge status: Home / Self Care | Payer: Medicaid Other | Attending: Emergency Medicine | Admitting: Emergency Medicine

## 2017-10-20 ENCOUNTER — Encounter (HOSPITAL_COMMUNITY): Payer: Self-pay | Admitting: Emergency Medicine

## 2017-10-20 DIAGNOSIS — J45909 Unspecified asthma, uncomplicated: Secondary | ICD-10-CM | POA: Insufficient documentation

## 2017-10-20 DIAGNOSIS — R51 Headache: Secondary | ICD-10-CM | POA: Insufficient documentation

## 2017-10-20 DIAGNOSIS — R519 Headache, unspecified: Secondary | ICD-10-CM

## 2017-10-20 DIAGNOSIS — F17210 Nicotine dependence, cigarettes, uncomplicated: Secondary | ICD-10-CM | POA: Insufficient documentation

## 2017-10-20 DIAGNOSIS — I10 Essential (primary) hypertension: Secondary | ICD-10-CM | POA: Insufficient documentation

## 2017-10-20 DIAGNOSIS — Z79899 Other long term (current) drug therapy: Secondary | ICD-10-CM | POA: Insufficient documentation

## 2017-10-20 LAB — CBC WITH DIFFERENTIAL/PLATELET
Basophils Absolute: 0 10*3/uL (ref 0.0–0.1)
Basophils Relative: 1 %
EOS ABS: 0 10*3/uL (ref 0.0–0.7)
EOS PCT: 1 %
HCT: 41.2 % (ref 36.0–46.0)
Hemoglobin: 13.8 g/dL (ref 12.0–15.0)
LYMPHS ABS: 1.6 10*3/uL (ref 0.7–4.0)
Lymphocytes Relative: 25 %
MCH: 29.5 pg (ref 26.0–34.0)
MCHC: 33.5 g/dL (ref 30.0–36.0)
MCV: 88 fL (ref 78.0–100.0)
MONO ABS: 0.4 10*3/uL (ref 0.1–1.0)
Monocytes Relative: 6 %
Neutro Abs: 4.4 10*3/uL (ref 1.7–7.7)
Neutrophils Relative %: 67 %
PLATELETS: 336 10*3/uL (ref 150–400)
RBC: 4.68 MIL/uL (ref 3.87–5.11)
RDW: 12.9 % (ref 11.5–15.5)
WBC: 6.4 10*3/uL (ref 4.0–10.5)

## 2017-10-20 LAB — BASIC METABOLIC PANEL
ANION GAP: 8 (ref 5–15)
BUN: 8 mg/dL (ref 6–20)
CHLORIDE: 105 mmol/L (ref 98–111)
CO2: 27 mmol/L (ref 22–32)
Calcium: 9.3 mg/dL (ref 8.9–10.3)
Creatinine, Ser: 0.76 mg/dL (ref 0.44–1.00)
GFR calc non Af Amer: >60 mL/min (ref >60)
Glucose, Bld: 91 mg/dL (ref 70–99)
POTASSIUM: 3.8 mmol/L (ref 3.5–5.1)
SODIUM: 140 mmol/L (ref 135–145)

## 2017-10-20 MED ORDER — SODIUM CHLORIDE 0.9 % IV BOLUS
1000.0000 mL | Freq: Once | INTRAVENOUS | Status: AC
  Administered 2017-10-20: 1000 mL via INTRAVENOUS

## 2017-10-20 MED ORDER — DEXAMETHASONE SODIUM PHOSPHATE 10 MG/ML IJ SOLN
10.0000 mg | Freq: Once | INTRAMUSCULAR | Status: AC
  Administered 2017-10-20: 10 mg via INTRAVENOUS
  Filled 2017-10-20: qty 1

## 2017-10-20 MED ORDER — METOCLOPRAMIDE HCL 5 MG/ML IJ SOLN
10.0000 mg | Freq: Once | INTRAMUSCULAR | Status: AC
  Administered 2017-10-20: 10 mg via INTRAVENOUS
  Filled 2017-10-20: qty 2

## 2017-10-20 MED ORDER — KETOROLAC TROMETHAMINE 15 MG/ML IJ SOLN
15.0000 mg | Freq: Once | INTRAMUSCULAR | Status: AC
Start: 2017-10-20 — End: 2017-10-20
  Administered 2017-10-20: 15 mg via INTRAVENOUS
  Filled 2017-10-20: qty 1

## 2017-10-20 MED ORDER — DIPHENHYDRAMINE HCL 50 MG/ML IJ SOLN
25.0000 mg | Freq: Once | INTRAMUSCULAR | Status: AC
  Administered 2017-10-20: 25 mg via INTRAVENOUS
  Filled 2017-10-20: qty 1

## 2017-10-20 NOTE — ED Provider Notes (Signed)
[facility] DEPT Provider Note   CSN: [id number] Arrival date & time: 10/20/17  1302     History   Chief Complaint Chief Complaint  Patient presents with  . Headache  . Nausea    HPI Kristin Stephens is a 32 y.o. female.  HPI  32 year old female with a history of migraines presents with a recurrent migraine.  She has migraines intermittently every few months.  She states this feels like her typical migraines except the nausea is more powerful.  Started about 3 days ago.  Waxes and wanes.  Tylenol has minimally helped.  Currently the pain is about a 7 out of 10.  It is bitemporal and throbbing.  No neck pain or stiffness.  No fevers or weakness/numbness.  There is photophobia but she also notes that she had an episode of about 30 minutes of double vision.  She states she was driving in the car when this occurred.  She is had this once before with headaches and was told it was nothing different than her migraines.  Otherwise this feels just like migraines.  She is not having a double vision now.  She does feel overall dizzy and foggy headed.  She states she overall feels fatigued.  She has vomited a couple times this morning. LMP 1 week ago  Past Medical History:  Diagnosis Date  . ADHD (attention deficit hyperactivity disorder)   . Anxiety   . Asthma    daily and prn inhalers  . Bipolar affective disorder ([place])   . Dental crowns present   . Depression   . Deviated nasal septum 05/2012  . Eczema    right leg  . Esophageal reflux   . Fibromyalgia   . Hypertension   . IBS (irritable bowel syndrome)   . Migraine headache   . Nasal turbinate hypertrophy 05/2012   bilateral  . OCD (obsessive compulsive disorder)   . TMJ syndrome     Patient Active Problem List   Diagnosis Date Noted  . Intentional overdose of drug in tablet form ([place]) 01/26/2017  . Bipolar 1 disorder, depressed, severe ([place]) 01/26/2017  . Bipolar 2 disorder ([place]) 05/16/2014  .  Internal hemorrhoids with complication 37/90/2409  . Vasomotor rhinitis 04/15/2013  . Gastritis without bleeding 03/10/2013  . Anal fissure 03/10/2013  . Obsessive compulsive disorder 03/07/2013  . Fibromyalgia 02/06/2013  . Detachment of glenoid labrum 01/13/2013  . Difficulty in walking(719.7) 02/18/2012  . 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 Asthma, intrinsic 08/28/2010  . ATTENTION DEFICIT DISORDER, ADULT 01/19/2008  . RHINITIS MEDICAMENTOSA 12/08/2007  . ALLERGIC RHINITIS 12/08/2007  . IBS 12/08/2007  . ECZEMA 12/08/2007  . 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 Migraine headache 04/24/2007  . Essential hypertension 04/24/2007  . ASVD 04/24/2007  . GERD 04/24/2007  . VSD 04/24/2007  . 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 ASD AND VSD REPAIR  1989  . CARDIAC CATHETERIZATION  06/18/2006  . CESAREAN SECTION  04/12/2009  . COARCTATION OF AORTA REPAIR  1989  . COLONOSCOPY N/A 05/20/2013   Procedure: COLONOSCOPY;  Surgeon: Danie Binder, MD;  Location: [facility];  Service: Endoscopy;  Laterality: N/A;  115-moved to [place] notified pt  . ESOPHAGOGASTRODUODENOSCOPY  04/04/2008     Normal esophagus/Mild patchy erythema in the antrum/ Normal duodenal bulb, normal small bowel biopsy  . FLEXIBLE SIGMOIDOSCOPY  04/04/2008     Small internal hemorrhoids (  poor bowel prep)  . HEMORRHOID BANDING N/A 05/20/2013   Procedure: HEMORRHOID BANDING;  Surgeon: Danie Binder, MD;  Location: [facility];  Service: Endoscopy;  Laterality: N/A;  . NASAL SEPTOPLASTY W/ TURBINOPLASTY Bilateral 05/31/2012   Procedure: NASAL SEPTOPLASTY WITH TURBINATE REDUCTION;  Surgeon: Ascencion Dike, MD;  Location: [facility];  Service: ENT;  Laterality: Bilateral;     OB History    Gravida  1   Para  1   Term  1   Preterm      AB      Living  1     SAB      TAB      Ectopic      Multiple      Live Births                Home Medications    Prior to Admission medications   Medication Sig Start Date End Date Taking? Authorizing Provider  acetaminophen (TYLENOL) 500 MG tablet Take 1,000-2,000 mg by mouth every 6 (six) hours as needed for mild pain, moderate pain, fever or headache.   Yes [provider]  ARIPiprazole (ABILIFY) 15 MG tablet Take 1 tablet (15 mg total) by mouth daily. 09/25/17  Yes Arfeen, Arlyce Harman, MD  FLUoxetine (PROZAC) 40 MG capsule Take 1 capsule (40 mg total) by mouth daily. 09/25/17  Yes Arfeen, Arlyce Harman, MD  fluticasone (FLONASE) 50 MCG/ACT nasal spray USE 2 SPRAYS IN EACH NOSTRIL EVERY DAY Patient taking differently: USE 2 SPRAYS IN EACH NOSTRIL EVERY DAY AS NEEDED FOR ALLERGIES 09/21/17  Yes Mikey Kirschner, MD  gabapentin (NEURONTIN) 300 MG capsule Take 600-900 mg by mouth See admin instructions. Take 600 mg in the morning then take 600mg  in the afternoon, then take 900mg  at night   Yes [provider]  hydrOXYzine (ATARAX/VISTARIL) 25 MG tablet Take 1 tablet (25 mg total) by mouth daily as needed for anxiety. 07/15/17  Yes Arfeen, Arlyce Harman, MD  lamoTRIgine (LAMICTAL) 200 MG tablet Take 1 tablet (200 mg total) by mouth at bedtime. 09/25/17  Yes Arfeen, Arlyce Harman, MD  lisdexamfetamine (VYVANSE) 40 MG capsule Take 1 capsule (40 mg total) by mouth daily. 09/25/17  Yes Arfeen, Arlyce Harman, MD  Melatonin 5 MG TABS Take 1 tablet by mouth at bedtime as needed (sleep).   Yes [provider]  metoprolol succinate (TOPROL XL) 25 MG 24 hr tablet Take 0.5 tablets (12.5 mg total) by mouth at bedtime. For palpitations. 06/04/17  Yes [name], MD  Multiple Vitamin (MULTIVITAMIN WITH MINERALS) TABS tablet Take 1 tablet by mouth daily.   Yes [provider]  traZODone (DESYREL) 50 MG tablet Take 1 tablet (50 mg total) by mouth at bedtime as needed for sleep. 09/25/17  Yes Arfeen, Arlyce Harman, MD  albuterol (PROVENTIL HFA;VENTOLIN HFA) 108 (90 Base) MCG/ACT inhaler Inhale 1-2  puffs into the lungs every 6 (six) hours as needed for wheezing or shortness of breath.    [provider]  diphenhydrAMINE (BENADRYL) 25 MG tablet Take 25 mg by mouth every 6 (six) hours as needed for itching.    [provider]  HYDROCORTISONE EX Apply 1 application topically as needed (ECZEMA).    [provider]  ketorolac (TORADOL) 10 MG tablet Take 10 mg by mouth as needed for migraine.    [provider]  loratadine (CLARITIN) 10 MG tablet Take 10 mg by mouth as needed for allergies.  [provider]  ondansetron (ZOFRAN) 4 MG tablet Take 1 tablet (4 mg total) by mouth every 8 (eight) hours as needed for nausea or vomiting. 02/20/17   Pisciotta, Elmyra Ricks, PA-C    Family History Family History  Problem Relation Age of Onset  . Hyperlipidemia Mother   . Hypertension Mother   . Depression Mother   . Physical abuse Mother   . Sexual abuse Mother        possibly  . Sleep apnea Mother   . Emphysema Paternal Grandmother   . Heart disease Paternal Grandmother   . Bipolar disorder Paternal Grandmother   . Dementia Paternal Grandmother   . Heart disease Maternal Grandmother   . Dementia Maternal Grandmother   . Stroke Maternal Grandfather   . Prostate cancer Maternal Grandfather   . Personality disorder Father   . Bipolar disorder Father   . Alcohol abuse Father   . ADD / ADHD Sister   . Anxiety disorder Brother   . ADD / ADHD Brother   . ADD / ADHD Brother   . Seizures Daughter   . Drug abuse Neg Hx   . OCD Neg Hx   . Paranoid behavior Neg Hx   . Schizophrenia Neg Hx     Social History Social History   Tobacco Use  . Smoking status: Current Every Day Smoker    Packs/day: 0.50    Years: 10.00    Pack years: 5.00    Types: Cigarettes    Start date: 02/27/2002  . Smokeless tobacco: Never Used  . Tobacco comment: has cut back to less than 1/2 a pack and trying to quit  Substance Use Topics  . 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 Drug use: No    Comment: marijuana and pain pills; 12/08/07-smoked x 1 month ago- this was the first time in 2.years.  Morphine Sulfate and oxycontin - none since age 79     Allergies   Sulfonamide derivatives and Coconut flavor   Review of Systems Review of Systems  Constitutional: Positive for fatigue.  Eyes: Positive for photophobia and visual disturbance.  Respiratory: Negative for shortness of breath.   Cardiovascular: Negative for chest pain.  Gastrointestinal: Positive for nausea and vomiting.  Genitourinary: Negative for menstrual problem.  Musculoskeletal: Negative for neck pain and neck stiffness.  Neurological: Positive for dizziness and headaches. Negative for weakness and numbness.  All other systems reviewed and are negative.    Physical Exam Updated Vital Signs BP 130/79 (BP Location: Left Arm)   Pulse 85   Temp 98.9 F (37.2 C) (Oral)   Resp 16   Ht 5\' 5"  (1.651 m)   Wt 99.8 kg (220 lb)   SpO2 100%   BMI 36.61 kg/m   Physical Exam  Constitutional: She is oriented to person, place, and time. She appears well-developed and well-nourished.  Non-toxic appearance. She does not appear ill.  HENT:  Head: Normocephalic and atraumatic.  Right Ear: External ear normal.  Left Ear: External ear normal.  Nose: Nose normal.  Eyes: Pupils are equal, round, and reactive to light. EOM are normal. Right eye exhibits no discharge. Left eye exhibits no discharge.  Neck: Normal range of motion. Neck supple.  Cardiovascular: Normal rate, regular rhythm and normal heart sounds.  Pulmonary/Chest: Effort normal and breath sounds normal.  Abdominal: Soft. There is no tenderness.  Neurological: She is alert and  oriented to person, place, and time.  CN 3-12 grossly intact. 5/5 strength in all 4 extremities. Grossly normal sensation. Normal finger to nose.   Skin: Skin is warm and dry.  Nursing note and vitals  reviewed.    ED Treatments / Results  Labs (all labs ordered are listed, but only abnormal results are displayed) Labs Reviewed  BASIC METABOLIC PANEL  CBC WITH DIFFERENTIAL/PLATELET    EKG None  Radiology No results found.  Procedures Procedures (including critical care time)  Medications Ordered in ED Medications  sodium chloride 0.9 % bolus 1,000 mL (1,000 mLs Intravenous New Bag/Given 10/20/17 1504)  metoCLOPramide (REGLAN) injection 10 mg (10 mg Intravenous Given 10/20/17 1505)  diphenhydrAMINE (BENADRYL) injection 25 mg (25 mg Intravenous Given 10/20/17 1505)  ketorolac (TORADOL) 15 MG/ML injection 15 mg (15 mg Intravenous Given 10/20/17 1504)  dexamethasone (DECADRON) injection 10 mg (10 mg Intravenous Given 10/20/17 1505)     Initial Impression / Assessment and Plan / ED Course  I have reviewed the triage vital signs and the nursing notes.  Pertinent labs & imaging results that were available during my care of the patient were reviewed by me and considered in my medical decision making (see chart for details).     Headache is improved with IV medicines.  Her lab work is reassuring and otherwise her not feeling well/fatigue is probably related to the headache.  There is no fever, neck stiffness or any other concerning signs or symptoms to suggest a serious acute CNS emergency such as meningitis, encephalitis, venous thrombosis, head bleed, stroke, etc.  She did have a transient blurry vision which might of been related to dizziness but she is also had this once before with her migraine headaches.  Given everything else feels just like a migraine my suspicion for another process is low and I do not think acute imaging is needed.  She is feeling better and requesting discharge home and we discussed return precautions but she otherwise appears stable for discharge.  Final Clinical Impressions(s) / ED Diagnoses   Final diagnoses:  Temporal headache    ED Discharge Orders      None       Sherwood Gambler, MD 10/20/17 1601

## 2017-10-20 NOTE — Discharge Instructions (Signed)
If your headache worsens or you develop blurry vision, vomiting, fever, weakness or numbness in your arms or legs, or any other new/concerning symptoms and return to the ER for evaluation.

## 2017-10-20 NOTE — ED Triage Notes (Signed)
Patient here from home with complaints of headache. Hx of same. Tylenol with no relief. Nausea.

## 2017-10-26 ENCOUNTER — Other Ambulatory Visit (HOSPITAL_COMMUNITY): Payer: Self-pay

## 2017-10-26 ENCOUNTER — Telehealth (HOSPITAL_COMMUNITY): Payer: Self-pay

## 2017-10-26 DIAGNOSIS — F3131 Bipolar disorder, current episode depressed, mild: Secondary | ICD-10-CM

## 2017-10-26 DIAGNOSIS — F900 Attention-deficit hyperactivity disorder, predominantly inattentive type: Secondary | ICD-10-CM

## 2017-10-26 MED ORDER — LAMOTRIGINE 200 MG PO TABS: 200.0000 mg | ORAL_TABLET | Freq: Every day | ORAL | 0 refills | Status: DC

## 2017-10-26 MED ORDER — ARIPIPRAZOLE 15 MG PO TABS: 15.0000 mg | ORAL_TABLET | Freq: Every day | ORAL | 0 refills | Status: DC

## 2017-10-26 MED ORDER — FLUOXETINE HCL 40 MG PO CAPS: 40.0000 mg | ORAL_CAPSULE | Freq: Every day | ORAL | 0 refills | Status: DC

## 2017-10-26 NOTE — Telephone Encounter (Signed)
Patient needs a refill of Vyvanse sent to [facility], she had to reschedule appointment with Arfeen. Please review and advise, thank you

## 2017-10-27 MED ORDER — LISDEXAMFETAMINE DIMESYLATE 40 MG PO CAPS: 40.0000 mg | ORAL_CAPSULE | Freq: Every day | ORAL | 0 refills | Status: DC

## 2017-10-27 NOTE — Telephone Encounter (Signed)
Refill sent.

## 2017-11-24 ENCOUNTER — Other Ambulatory Visit (HOSPITAL_COMMUNITY): Payer: Self-pay

## 2017-11-24 ENCOUNTER — Telehealth (HOSPITAL_COMMUNITY): Payer: Self-pay

## 2017-11-24 DIAGNOSIS — F900 Attention-deficit hyperactivity disorder, predominantly inattentive type: Secondary | ICD-10-CM

## 2017-11-24 DIAGNOSIS — F3131 Bipolar disorder, current episode depressed, mild: Secondary | ICD-10-CM

## 2017-11-24 MED ORDER — FLUOXETINE HCL 40 MG PO CAPS: 40.0000 mg | ORAL_CAPSULE | Freq: Every day | ORAL | 0 refills | Status: DC

## 2017-11-24 MED ORDER — TRAZODONE HCL 50 MG PO TABS: 50.0000 mg | ORAL_TABLET | Freq: Every evening | ORAL | 0 refills | Status: DC | PRN

## 2017-11-24 MED ORDER — LAMOTRIGINE 200 MG PO TABS: 200.0000 mg | ORAL_TABLET | Freq: Every day | ORAL | 0 refills | Status: DC

## 2017-11-24 MED ORDER — LISDEXAMFETAMINE DIMESYLATE 40 MG PO CAPS: 40.0000 mg | ORAL_CAPSULE | Freq: Every day | ORAL | 0 refills | Status: DC

## 2017-11-24 MED ORDER — ARIPIPRAZOLE 15 MG PO TABS: 15.0000 mg | ORAL_TABLET | Freq: Every day | ORAL | 0 refills | Status: DC

## 2017-11-24 NOTE — Telephone Encounter (Signed)
All done

## 2017-11-24 NOTE — Telephone Encounter (Signed)
Kristin Stephens patient needs a refill on Vyvanse. Please send to [facility], thank you

## 2017-12-02 ENCOUNTER — Telehealth (HOSPITAL_COMMUNITY): Payer: Self-pay

## 2017-12-02 ENCOUNTER — Encounter (HOSPITAL_COMMUNITY): Payer: Self-pay | Admitting: Psychiatry

## 2017-12-02 ENCOUNTER — Ambulatory Visit (HOSPITAL_COMMUNITY): Payer: Self-pay | Admitting: Psychiatry

## 2017-12-02 VITALS — BP 128/74 | HR 80 | Ht 64.0 in | Wt 220.0 lb

## 2017-12-02 DIAGNOSIS — Z810 Family history of intellectual disabilities: Secondary | ICD-10-CM

## 2017-12-02 DIAGNOSIS — F411 Generalized anxiety disorder: Secondary | ICD-10-CM

## 2017-12-02 DIAGNOSIS — F900 Attention-deficit hyperactivity disorder, predominantly inattentive type: Secondary | ICD-10-CM

## 2017-12-02 DIAGNOSIS — Z79899 Other long term (current) drug therapy: Secondary | ICD-10-CM

## 2017-12-02 DIAGNOSIS — F17210 Nicotine dependence, cigarettes, uncomplicated: Secondary | ICD-10-CM

## 2017-12-02 DIAGNOSIS — Z818 Family history of other mental and behavioral disorders: Secondary | ICD-10-CM

## 2017-12-02 DIAGNOSIS — F3131 Bipolar disorder, current episode depressed, mild: Secondary | ICD-10-CM

## 2017-12-02 DIAGNOSIS — Z811 Family history of alcohol abuse and dependence: Secondary | ICD-10-CM

## 2017-12-02 MED ORDER — LAMOTRIGINE 200 MG PO TABS: 200.0000 mg | ORAL_TABLET | Freq: Every day | ORAL | 2 refills | Status: DC

## 2017-12-02 MED ORDER — FLUOXETINE HCL 40 MG PO CAPS: 40.0000 mg | ORAL_CAPSULE | Freq: Every day | ORAL | 2 refills | Status: DC

## 2017-12-02 MED ORDER — LISDEXAMFETAMINE DIMESYLATE 40 MG PO CAPS: 40.0000 mg | ORAL_CAPSULE | Freq: Every day | ORAL | 0 refills | Status: DC

## 2017-12-02 MED ORDER — ARIPIPRAZOLE 15 MG PO TABS: 15.0000 mg | ORAL_TABLET | Freq: Every day | ORAL | 2 refills | Status: DC

## 2017-12-02 MED ORDER — HYDROXYZINE HCL 25 MG PO TABS: 25.0000 mg | ORAL_TABLET | Freq: Every day | ORAL | 2 refills | Status: DC | PRN

## 2017-12-02 NOTE — Telephone Encounter (Signed)
Patient called and she forgot to get the other two prescriptions of Vyvanse. Please review and advise, thank you

## 2017-12-02 NOTE — Telephone Encounter (Signed)
I thought she was going to complete the forms and then bring back to [place].  Do you want me to write them or wait until she gets approved?  She was going to ask Dr. Adele Schilder about going to something cheaper if she is not approved. SM

## 2017-12-02 NOTE — Progress Notes (Signed)
BH MD/PA/NP OP Progress Note  12/02/2017 8:11 AM Kristin Stephens  MRN:  [id number]  Chief Complaint: I am still not seeing my daughter very often.  I think I need to go into mediation with my sister.  Chief Complaint    Follow-up; Anxiety; ADHD         HPI: Kristin Stephens came for her follow-up appointment, she is unaccompanied.  At last visit, 06/2017 increased Vyvanse 40 mg to help her attention and focus, increase Abilify 15 mg to help her mood lability.  Continue Prozac 40 mg daily, Lamictal 200 mg daily and take Vistaril and trazodone as needed. Patient presents today and reports that the medication changes have been helpful. She reports feeling happy 99% of the time, but when she thinks about being able to see her daughter she "gets very sad, and cries a lot".  She specifically denies any suicidal thoughts, plan or intent.  She states that work is good, and her boss is supportive.  She feels her mother is a source of support as well in advocating for her to have time with her daughter Kristin Stephens age 65 in temporary custody since age 8 with her sister and brother in law in [place]).  Kristin Stephens reports that she has been providing financial support for her daughter. She states that her daughter has started to exhibit symptoms of anger, and is starting therapy.   Kristin Stephens is taking medications which have been effective for managing symptoms. She is not currently in therapy, due to costs, but regularly attends [facility] meetings. Patient lives alone, and has started dating.  She denies hypersexual behaviors. Sleep is good, and she feels rested when awakens for the day. She denies any regular irritability, anger, mania, psychosis. She denies any specific phobias, panic attack or any feeling of hopelessness or worthlessness. She denies mania or hypomania with racing thoughts, pressured speech, hyperactivity, grandiosity, distractibility, or changes in sleep or appetite. Anxiety symptoms are stable.  She denies  SI, HI, self harm thoughts or AVH. She is tolerating medication without any side effects.  She has no tremors, shakes or any EPS.  denies HA difficulties.  Patient denies using alcohol or any illegal substances. Her energy level is fair.  Her appetite is good.  Vital signs are stable.  No new medication added.  She expresses concern about the cost of Vyvanse, and she has been provided with [employer] application.     Visit Diagnosis:    ICD-10-CM   1. Bipolar affective disorder, currently depressed, mild (HCC) F31.31 ARIPiprazole (ABILIFY) 15 MG tablet    FLUoxetine (PROZAC) 40 MG capsule    hydrOXYzine (ATARAX/VISTARIL) 25 MG tablet    lamoTRIgine (LAMICTAL) 200 MG tablet  2. Attention deficit hyperactivity disorder (ADHD), predominantly inattentive type F90.0 lisdexamfetamine (VYVANSE) 40 MG capsule  3. Generalized anxiety disorder F41.1   4. Encounter for medication management Z79.899     Past Psychiatric History: Reviewed Patient has history of mood swing, depression, anger and she was diagnosed with bipolar and ADD. She has a history of self abusive behavior by cutting her wrist with a razor. In the past she had tried Wellbutrin but limited response. Patient finished intensive outpatient program in 2016.She was admitted to behavioral [facility] in October 2018 after taking overdose on medication and then slashing her wrist. Her Adderall, Ativan and gabapentin was discontinued  Past Medical History:  Past Medical History:  Diagnosis Date  . ADHD (attention deficit hyperactivity disorder)   . Anxiety   .  Asthma    daily and prn inhalers  . Bipolar affective disorder ([place])   . Dental crowns present   . Depression   . Deviated nasal septum 05/2012  . Eczema    right leg  . Esophageal reflux   . Fibromyalgia   . Hypertension   . IBS (irritable bowel syndrome)   . Migraine headache   . Nasal turbinate hypertrophy 05/2012   bilateral  . OCD (obsessive compulsive disorder)    . TMJ syndrome     Past Surgical History:  Procedure Laterality Date  . ASD AND VSD REPAIR  1989  . CARDIAC CATHETERIZATION  06/18/2006  . CESAREAN SECTION  04/12/2009  . COARCTATION OF AORTA REPAIR  1989  . COLONOSCOPY N/A 05/20/2013   Procedure: COLONOSCOPY;  Surgeon: Danie Binder, MD;  [facility];  Service: Endoscopy;  Laterality: N/A;  115-moved to Colon notified pt  . ESOPHAGOGASTRODUODENOSCOPY  04/04/2008     Normal esophagus/Mild patchy erythema in the antrum/ Normal duodenal bulb, normal small bowel biopsy  . FLEXIBLE SIGMOIDOSCOPY  04/04/2008     Small internal hemorrhoids (poor bowel prep)  . HEMORRHOID BANDING N/A 05/20/2013   Procedure: HEMORRHOID BANDING;  Surgeon: Danie Binder, MD;  [facility];  Service: Endoscopy;  Laterality: N/A;  . NASAL SEPTOPLASTY W/ TURBINOPLASTY Bilateral 05/31/2012   Procedure: NASAL SEPTOPLASTY WITH TURBINATE REDUCTION;  Surgeon: Ascencion Dike, MD;  Location: [facility];  Service: ENT;  Laterality: Bilateral;    Family Psychiatric History: Reviewed  Family History:  Family History  Problem Relation Age of Onset  . Hyperlipidemia Mother   . Hypertension Mother   . Depression Mother   . Physical abuse Mother   . Sexual abuse Mother        possibly  . Sleep apnea Mother   . Emphysema Paternal Grandmother   . Heart disease Paternal Grandmother   . Bipolar disorder Paternal Grandmother   . Dementia Paternal Grandmother   . Heart disease Maternal Grandmother   . Dementia Maternal Grandmother   . Stroke Maternal Grandfather   . Prostate cancer Maternal Grandfather   . Personality disorder Father   . Bipolar disorder Father   . Alcohol abuse Father   . ADD / ADHD Sister   . Anxiety disorder Brother   . ADD / ADHD Brother   . ADD / ADHD Brother   . Seizures Daughter   . Drug abuse Neg Hx   . OCD Neg Hx   . Paranoid behavior Neg Hx   . Schizophrenia Neg Hx     Social History:  Lives  alone. 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    Social History   Socioeconomic History  . Marital status: Single    Spouse name: Not on file  . Number of children: 1  . Years of education: GED  . Highest education level: Not on file  Occupational History  . Occupation: Engineer, water  Social Needs  . Financial resource strain: Not on file  . Food insecurity:    Worry: Not on file    Inability: Not on file  . Transportation needs:    Medical: Not on file    Non-medical: Not on file  Tobacco Use  . Smoking status: Current Every Day Smoker    Packs/day: 0.50    Years: 10.00  Pack years: 5.00    Types: Cigarettes    Start date: 02/27/2002  . Smokeless tobacco: Never Used  . Tobacco comment: has cut back to less than 1/2 a pack and trying to quit  Substance and Sexual Activity  . 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 Drug use: No    Comment: marijuana and pain pills; 12/08/07-smoked x 1 month ago- this was the first time in 2.years.  Morphine Sulfate and oxycontin - none since age 69  . Sexual activity: Not Currently    Birth control/protection: Injection, Spermicide  Lifestyle  . Physical activity:    Days per week: Not on file    Minutes per session: Not on file  . Stress: Not on file  Relationships  . Social connections:    Talks on phone: Not on file    Gets together: Not on file    Attends religious service: Not on file    Active member of club or organization: Not on file    Attends meetings of clubs or organizations: Not on file    Relationship status: Not on file  Other Topics Concern  . Not on file  Social History Narrative   Lives with 3 roommates   LIKES TO WATCH DR. PIMPLE POPPER. AT AGE 22 LIKED TO PICK HER NOSE AND EAT THE DRIED MUCOUS.    Caffeine: coffee, soda,  occasional Red Bull    Allergies:  Allergies  Allergen Reactions  . Sulfonamide Derivatives Swelling    SWELLING OF EYES WITH OPHTHALMIC SULFA  . Coconut Flavor Rash    Metabolic Disorder Labs: Recent Results (from the past 2160 hour(s))  Basic metabolic panel     Status: None   Collection Time: 10/20/17  3:07 PM  Result Value Ref Range   Sodium 140 135 - 145 mmol/L   Potassium 3.8 3.5 - 5.1 mmol/L   Chloride 105 98 - 111 mmol/L   CO2 27 22 - 32 mmol/L   Glucose, Bld 91 70 - 99 mg/dL   BUN 8 6 - 20 mg/dL   Creatinine, Ser 0.76 0.44 - 1.00 mg/dL   Calcium 9.3 8.9 - 10.3 mg/dL   GFR calc non Af Amer >60 >60 mL/min   GFR calc Af Amer >60 >60 mL/min    Comment: (NOTE) The eGFR has been calculated using the CKD EPI equation. This calculation has not been validated in all clinical situations. eGFR's persistently <60 mL/min signify possible Chronic Kidney Disease.    Anion gap 8 5 - 15    Comment: Performed at [facility], [place] [address] [place], [place] [address]  CBC with Differential     Status: None   Collection Time: 10/20/17  3:07 PM  Result Value Ref Range   WBC 6.4 4.0 - 10.5 K/uL   RBC 4.68 3.87 - 5.11 MIL/uL   Hemoglobin 13.8 12.0 - 15.0 g/dL   HCT 41.2 36.0 - 46.0 %   MCV 88.0 78.0 - 100.0 fL   MCH 29.5 26.0 - 34.0 pg   MCHC 33.5 30.0 - 36.0 g/dL   RDW 12.9 11.5 - 15.5 %   Platelets 336 150 - 400 K/uL   Neutrophils Relative % 67 %   Neutro Abs 4.4 1.7 - 7.7 K/uL   Lymphocytes Relative 25 %   Lymphs Abs 1.6 0.7 - 4.0 K/uL   Monocytes Relative 6 %  Monocytes Absolute 0.4 0.1 - 1.0 K/uL   Eosinophils Relative 1 %   Eosinophils Absolute 0.0 0.0 - 0.7 K/uL   Basophils Relative 1 %   Basophils Absolute 0.0 0.0 - 0.1 K/uL    Comment: Performed at [facility], [place] [address] [place], [place] [address]   Lab Results  Component Value Date   HGBA1C 5.0 07/17/2014   MPG 97 08/28/2011   No results found for:  PROLACTIN Lab Results  Component Value Date   CHOL 181 06/04/2017   TRIG 120 06/04/2017   HDL 49 06/04/2017   CHOLHDL 3.7 06/04/2017   VLDL 14 02/06/2007   LDLCALC 108 (H) 06/04/2017   LDLCALC  02/06/2007    92        Total Cholesterol/HDL:CHD Risk Coronary Heart Disease Risk Table                     Men   Women  1/2 Average Risk   3.4   3.3   Lab Results  Component Value Date   TSH 1.299 03/31/2008   TSH 1.424 Test methodology is 3rd generation TSH 02/05/2007    Therapeutic Level Labs: No results found for: LITHIUM No results found for: VALPROATE No components found for:  CBMZ  Current Medications: Current Outpatient Medications  Medication Sig Dispense Refill  . acetaminophen (TYLENOL) 500 MG tablet Take 1,000-2,000 mg by mouth every 6 (six) hours as needed for mild pain, moderate pain, fever or headache.    . albuterol (PROVENTIL HFA;VENTOLIN HFA) 108 (90 Base) MCG/ACT inhaler Inhale 1-2 puffs into the lungs every 6 (six) hours as needed for wheezing or shortness of breath.    . ARIPiprazole (ABILIFY) 15 MG tablet Take 1 tablet (15 mg total) by mouth daily. 30 tablet 0  . diphenhydrAMINE (BENADRYL) 25 MG tablet Take 25 mg by mouth every 6 (six) hours as needed for itching.    Marland Kitchen FLUoxetine (PROZAC) 40 MG capsule Take 1 capsule (40 mg total) by mouth daily. 30 capsule 0  . fluticasone (FLONASE) 50 MCG/ACT nasal spray USE 2 SPRAYS IN EACH NOSTRIL EVERY DAY (Patient taking differently: USE 2 SPRAYS IN EACH NOSTRIL EVERY DAY AS NEEDED FOR ALLERGIES) 16 g 5  . gabapentin (NEURONTIN) 300 MG capsule Take 600-900 mg by mouth See admin instructions. Take 600 mg in the morning then take 654m in the afternoon, then take 908mat night    . HYDROCORTISONE EX Apply 1 application topically as needed (ECZEMA).    . hydrOXYzine (ATARAX/VISTARIL) 25 MG tablet Take 1 tablet (25 mg total) by mouth daily as needed for anxiety. 30 tablet 1  . ketorolac (TORADOL) 10 MG tablet Take 10 mg by mouth  as needed for migraine.    . lamoTRIgine (LAMICTAL) 200 MG tablet Take 1 tablet (200 mg total) by mouth at bedtime. 30 tablet 0  . lisdexamfetamine (VYVANSE) 40 MG capsule Take 1 capsule (40 mg total) by mouth daily. 30 capsule 0  . loratadine (CLARITIN) 10 MG tablet Take 10 mg by mouth as needed for allergies.    . Melatonin 5 MG TABS Take 1 tablet by mouth at bedtime as needed (sleep).    . metoprolol succinate (TOPROL XL) 25 MG 24 hr tablet Take 0.5 tablets (12.5 mg total) by mouth at bedtime. For palpitations. 15 tablet 6  . Multiple Vitamin (MULTIVITAMIN WITH MINERALS) TABS tablet Take 1 tablet by mouth daily.    . ondansetron (ZOFRAN) 4 MG tablet Take  1 tablet (4 mg total) by mouth every 8 (eight) hours as needed for nausea or vomiting. 10 tablet 0  . traZODone (DESYREL) 50 MG tablet Take 1 tablet (50 mg total) by mouth at bedtime as needed for sleep. 30 tablet 0   No current facility-administered medications for this visit.      Musculoskeletal: Strength & Muscle Tone: within normal limits Gait & Station: normal Patient leans: N/A  Psychiatric Specialty Exam: Review of Systems  Constitutional: Negative.   HENT: Negative.   Respiratory: Negative.   Cardiovascular: Negative.   Gastrointestinal: Negative.   Musculoskeletal: Positive for joint pain.       Fibromyalgia diagnosed in 2012   Skin: Negative.   Neurological: Positive for headaches. Negative for tremors.  Psychiatric/Behavioral: Negative for depression, hallucinations, memory loss, substance abuse and suicidal ideas. The patient is nervous/anxious. The patient does not have insomnia (sleeps 6-8 hours a night).     Blood pressure 128/74, pulse 80, height 5' 4"  (1.626 m), weight 220 lb (99.8 kg).Body mass index is 37.76 kg/m.  General Appearance: Casual and some crying when discussing daughter, but calms easily  Eye Contact:  Good  Speech:  Clear and Coherent and Normal Rate  Volume:  Normal  Mood:  Euthymic   Affect:  Congruent, Full Range and calm  Thought Process:  Coherent, Goal Directed, Linear and Descriptions of Associations: Intact  Orientation:  Full (Time, Place, and Person)  Thought Content: Logical and Hallucinations: None   Suicidal Thoughts:  No  Homicidal Thoughts:  No  Memory:  Immediate;   Good Recent;   Good Remote;   Good  Judgement:  Good  Insight:  Good  Psychomotor Activity:  Normal  Concentration:  Concentration: Good and Attention Span: Good  Recall:  Good  Fund of Knowledge: Good  Language: Good  Akathisia:  No  Handed:  Right  AIMS (if indicated): not done  Assets:  Communication Skills Desire for Improvement Housing Physical Health Resilience  ADL's:  Intact  Cognition: WNL  Sleep:  Fair   Screenings: AIMS     Admission (Discharged) from 01/26/2017 in [facility]  AIMS Total Score  0    AUDIT     Admission (Discharged) from 01/26/2017 in [facility]  Alcohol Use Disorder Identification Test Final Score (AUDIT)  0    Mini-Mental     Office Visit from 07/31/2016 in [facility]  Total Score (max 30 points )  29    PHQ2-9     Office Visit from 03/02/2017 in [facility]  PHQ-2 Total Score  2  PHQ-9 Total Score  11       Assessment and Plan: Bipolar affective disorder, MRE depressed.  Generalized anxiety disorder.  Attention deficit disorder, inattentive type.  Recommended that family should discuss about Kosha's visiting privilege with her daughter, and consider legal mediation if necessary. Patient would like to have more frequent visits with daughter and have equitable time as daughter's father.  I have encourage she should see Anderson Malta again for therapy.  I encouraged to continue AA meeting as patient has been sober from alcohol for a while.  Continue medications:  Vyvanse 40 mg to help her attention and focus; Abilify 15 mg to help her mood  lability.  Continue Prozac 40 mg daily for depression, Lamictal 200 mg daily for mood stabilization and take Vistaril and trazodone as needed.  I recommended to call us back if she has  any question, concern if she feels worsening of the symptoms.  We discussed safety concerns at any time having active suicidal thoughts or homicidal thought that she need to call 911 or go to local emergency room.  I discussed medication side effect especially increased stimulant can cause manic symptoms and patient is aware about it.    Follow-up in 3 months.  Time spent 40 minutes.   Lavella Hammock, MD 12/02/2017, 8:11 AM

## 2017-12-11 ENCOUNTER — Telehealth: Payer: Self-pay | Admitting: Family Medicine

## 2017-12-11 NOTE — Telephone Encounter (Signed)
Patient has family planning medicaid and stated she is going to see her gyn Dr Quincy Simmonds to start back on her Depo and have her manage her and file her family planning Medicaid.

## 2017-12-11 NOTE — Telephone Encounter (Signed)
Pt is wondering if Dr. Richardson Landry will refill Depo even tho pt is currently in collections. please advise and inform pt.

## 2017-12-24 ENCOUNTER — Other Ambulatory Visit: Payer: Self-pay | Admitting: Internal Medicine

## 2018-01-25 ENCOUNTER — Telehealth (HOSPITAL_COMMUNITY): Payer: Self-pay

## 2018-01-25 DIAGNOSIS — F900 Attention-deficit hyperactivity disorder, predominantly inattentive type: Secondary | ICD-10-CM

## 2018-01-25 MED ORDER — LISDEXAMFETAMINE DIMESYLATE 40 MG PO CAPS: 40.0000 mg | ORAL_CAPSULE | Freq: Every day | ORAL | 0 refills | Status: DC

## 2018-01-25 NOTE — Telephone Encounter (Signed)
Called and spoke with patient and informed that prescription had been sent to the pharmacy

## 2018-01-25 NOTE — Telephone Encounter (Signed)
Patient needs a refill on Vyvanse 40mg . Next appointment is 03-04-18. Please advise

## 2018-01-25 NOTE — Telephone Encounter (Signed)
Refill done.  

## 2018-02-23 ENCOUNTER — Other Ambulatory Visit (HOSPITAL_COMMUNITY): Payer: Self-pay

## 2018-02-23 ENCOUNTER — Telehealth (HOSPITAL_COMMUNITY): Payer: Self-pay

## 2018-02-23 DIAGNOSIS — F3131 Bipolar disorder, current episode depressed, mild: Secondary | ICD-10-CM

## 2018-02-23 DIAGNOSIS — F900 Attention-deficit hyperactivity disorder, predominantly inattentive type: Secondary | ICD-10-CM

## 2018-02-23 MED ORDER — LISDEXAMFETAMINE DIMESYLATE 40 MG PO CAPS: 40.0000 mg | ORAL_CAPSULE | Freq: Every day | ORAL | 0 refills | Status: DC

## 2018-02-23 MED ORDER — TRAZODONE HCL 50 MG PO TABS: 50.0000 mg | ORAL_TABLET | Freq: Every evening | ORAL | 0 refills | Status: DC | PRN

## 2018-02-23 NOTE — Telephone Encounter (Signed)
Patient has an appointment next month, she is needing a refill on her Vyvanse. Please review and advise, thank you

## 2018-02-23 NOTE — Telephone Encounter (Signed)
Refill done.  Recommended to keep her appointment for future refills.

## 2018-03-01 ENCOUNTER — Telehealth: Payer: Self-pay | Admitting: Internal Medicine

## 2018-03-01 DIAGNOSIS — Q249 Congenital malformation of heart, unspecified: Secondary | ICD-10-CM

## 2018-03-01 NOTE — Telephone Encounter (Signed)
New Message   Patient is calling to see if she needs to have an echocardiogram prior to her appt. Please call to discuss.

## 2018-03-04 ENCOUNTER — Ambulatory Visit (INDEPENDENT_AMBULATORY_CARE_PROVIDER_SITE_OTHER): Payer: Medicaid Other | Admitting: Psychiatry

## 2018-03-04 VITALS — BP 126/74 | HR 88 | Ht 65.0 in | Wt 218.0 lb

## 2018-03-04 DIAGNOSIS — F1010 Alcohol abuse, uncomplicated: Secondary | ICD-10-CM

## 2018-03-04 DIAGNOSIS — F900 Attention-deficit hyperactivity disorder, predominantly inattentive type: Secondary | ICD-10-CM

## 2018-03-04 DIAGNOSIS — F3131 Bipolar disorder, current episode depressed, mild: Secondary | ICD-10-CM

## 2018-03-04 MED ORDER — TRAZODONE HCL 50 MG PO TABS: 50.0000 mg | ORAL_TABLET | Freq: Every evening | ORAL | 0 refills | Status: DC | PRN

## 2018-03-04 MED ORDER — FLUOXETINE HCL 40 MG PO CAPS: 40.0000 mg | ORAL_CAPSULE | Freq: Every day | ORAL | 2 refills | Status: DC

## 2018-03-04 MED ORDER — ARIPIPRAZOLE 15 MG PO TABS: 15.0000 mg | ORAL_TABLET | Freq: Every day | ORAL | 2 refills | Status: DC

## 2018-03-04 MED ORDER — LISDEXAMFETAMINE DIMESYLATE 40 MG PO CAPS: 40.0000 mg | ORAL_CAPSULE | Freq: Every day | ORAL | 0 refills | Status: DC

## 2018-03-04 MED ORDER — LAMOTRIGINE 200 MG PO TABS: 200.0000 mg | ORAL_TABLET | Freq: Every day | ORAL | 2 refills | Status: DC

## 2018-03-04 NOTE — Telephone Encounter (Signed)
Order for echo placed per last OV note.  This should be done in March, prior to next OV with Dr. Harrington Challenger.

## 2018-03-04 NOTE — Progress Notes (Signed)
[place] MD/PA/NP OP Progress Note  03/04/2018 8:55 AM Kristin Stephens  MRN:  [id number]  Chief Complaint: Had a good Thanksgiving.  I saw my daughter and had a good time.  HPI: Kristin Stephens came for her follow-up appointment.  She had a good Thanksgiving and she visited her daughter in [place].  She is taking her medication and feel her depression and mania is under control.  She admitted relapse into drinking 3 weeks ago however after a sip she realized about relapse and since then she has not drinking.  She is going to [place] very regularly and now she has a sponsor.  She had spent last weekend with her sponsor.  She had a good relationship with her brother-in-law and she now communicate with him about her daughter.  Her daughter is now 16 years old.  She feel her attention, focus is much better.  She is able to do multitasking.  She was disappointed because she was laid off from her last job few weeks ago.  She is looking for a new job.  She is using her saving and she also get inheritance from the family.  She is sleeping good.  She denies any mania, psychosis, hallucination.  She is currently not in therapy but she feel going to AA helps her a lot.  She is compliant with Abilify, Prozac, Lamictal and very rarely she takes hydroxyzine.  She has no rash, itching, tremors or shakes.  Her energy level is good.  She denies any suicidal thoughts or homicidal thought.  She is pleased that she has no more headaches in the past few months.  Visit Diagnosis:    ICD-10-CM   1. ETOH abuse F10.10   2. Bipolar affective disorder, currently depressed, mild (HCC) F31.31 lamoTRIgine (LAMICTAL) 200 MG tablet    traZODone (DESYREL) 50 MG tablet    FLUoxetine (PROZAC) 40 MG capsule    ARIPiprazole (ABILIFY) 15 MG tablet  3. Attention deficit hyperactivity disorder (ADHD), predominantly inattentive type F90.0 lisdexamfetamine (VYVANSE) 40 MG capsule    Past Psychiatric History: Reviewed. History of mood swing, depression,  anger, cutting her wrist with a razor and overdose on medication and with slashing her wrist.  Required admission in October 2018.  History of drinking and completed IOP in 2016.  Adderall, Ativan and gabapentin in the past. Tried Wellbutrin but limited response.   Past Medical History:  Past Medical History:  Diagnosis Date  . ADHD (attention deficit hyperactivity disorder)   . Anxiety   . Asthma    daily and prn inhalers  . Bipolar affective disorder ([place])   . Dental crowns present   . Depression   . Deviated nasal septum 05/2012  . Eczema    right leg  . Esophageal reflux   . Fibromyalgia   . Hypertension   . IBS (irritable bowel syndrome)   . Migraine headache   . Nasal turbinate hypertrophy 05/2012   bilateral  . OCD (obsessive compulsive disorder)   . TMJ syndrome     Past Surgical History:  Procedure Laterality Date  . ASD AND VSD REPAIR  1989  . CARDIAC CATHETERIZATION  06/18/2006  . CESAREAN SECTION  04/12/2009  . COARCTATION OF AORTA REPAIR  1989  . COLONOSCOPY N/A 05/20/2013   Procedure: COLONOSCOPY;  Surgeon: Danie Binder, MD;  [facility];  Service: Endoscopy;  Laterality: N/A;  115-moved to [place] notified pt  . ESOPHAGOGASTRODUODENOSCOPY  04/04/2008     Normal esophagus/Mild patchy erythema  in the antrum/ Normal duodenal bulb, normal small bowel biopsy  . FLEXIBLE SIGMOIDOSCOPY  04/04/2008     Small internal hemorrhoids (poor bowel prep)  . HEMORRHOID BANDING N/A 05/20/2013   Procedure: HEMORRHOID BANDING;  Surgeon: Danie Binder, MD;  [facility];  Service: Endoscopy;  Laterality: N/A;  . NASAL SEPTOPLASTY W/ TURBINOPLASTY Bilateral 05/31/2012   Procedure: NASAL SEPTOPLASTY WITH TURBINATE REDUCTION;  Surgeon: Ascencion Dike, MD;  Location: [facility];  Service: ENT;  Laterality: Bilateral;    Family Psychiatric History: Viewed.  Family History:  Family History  Problem Relation Age of Onset  . Hyperlipidemia Mother    . Hypertension Mother   . Depression Mother   . Physical abuse Mother   . Sexual abuse Mother        possibly  . Sleep apnea Mother   . Emphysema Paternal Grandmother   . Heart disease Paternal Grandmother   . Bipolar disorder Paternal Grandmother   . Dementia Paternal Grandmother   . Heart disease Maternal Grandmother   . Dementia Maternal Grandmother   . Stroke Maternal Grandfather   . Prostate cancer Maternal Grandfather   . Personality disorder Father   . Bipolar disorder Father   . Alcohol abuse Father   . ADD / ADHD Sister   . Anxiety disorder Brother   . ADD / ADHD Brother   . ADD / ADHD Brother   . Seizures Daughter   . Drug abuse Neg Hx   . OCD Neg Hx   . Paranoid behavior Neg Hx   . Schizophrenia Neg Hx     Social History:  Social History   Socioeconomic History  . Marital status: Single    Spouse name: Not on file  . Number of children: 1  . Years of education: GED  . Highest education level: Not on file  Occupational History  . Occupation: Engineer, water  Social Needs  . Financial resource strain: Not on file  . Food insecurity:    Worry: Not on file    Inability: Not on file  . Transportation needs:    Medical: Not on file    Non-medical: Not on file  Tobacco Use  . Smoking status: Current Every Day Smoker    Packs/day: 0.50    Years: 10.00    Pack years: 5.00    Types: Cigarettes    Start date: 02/27/2002  . Smokeless tobacco: Never Used  . Tobacco comment: has cut back to less than 1/2 a pack and trying to quit  Substance and Sexual Activity  . 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 Drug use: No    Comment: marijuana and pain pills; 12/08/07-smoked x 1 month ago- this was the first time in 2.years.  Morphine Sulfate and oxycontin - none since age 79  . Sexual activity: Not Currently    Birth control/protection: Injection, Spermicide  Lifestyle  . Physical activity:     Days per week: Not on file    Minutes per session: Not on file  . Stress: Not on file  Relationships  . Social connections:    Talks on phone: Not on file    Gets together: Not on file    Attends religious service: Not on file    Active member of club or organization: Not on file    Attends meetings of clubs or organizations: Not on file  Relationship status: Not on file  Other Topics Concern  . Not on file  Social History Narrative   Lives with 3 roommates   LIKES TO WATCH DR. PIMPLE POPPER. AT AGE 28 LIKED TO PICK HER NOSE AND EAT THE DRIED MUCOUS.    Caffeine: coffee, soda, occasional Red Bull    Allergies:  Allergies  Allergen Reactions  . Sulfonamide Derivatives Swelling    SWELLING OF EYES WITH OPHTHALMIC SULFA  . Coconut Flavor Rash    Metabolic Disorder Labs: Lab Results  Component Value Date   HGBA1C 5.0 07/17/2014   MPG 97 08/28/2011   No results found for: PROLACTIN Lab Results  Component Value Date   CHOL 181 06/04/2017   TRIG 120 06/04/2017   HDL 49 06/04/2017   CHOLHDL 3.7 06/04/2017   VLDL 14 02/06/2007   LDLCALC 108 (H) 06/04/2017   LDLCALC  02/06/2007    92        Total Cholesterol/HDL:CHD Risk Coronary Heart Disease Risk Table                     Men   Women  1/2 Average Risk   3.4   3.3   Lab Results  Component Value Date   TSH 1.299 03/31/2008   TSH 1.424 Test methodology is 3rd generation TSH 02/05/2007    Therapeutic Level Labs: No results found for: LITHIUM No results found for: VALPROATE No components found for:  CBMZ  Current Medications: Current Outpatient Medications  Medication Sig Dispense Refill  . acetaminophen (TYLENOL) 500 MG tablet Take 1,000-2,000 mg by mouth every 6 (six) hours as needed for mild pain, moderate pain, fever or headache.    . albuterol (PROVENTIL HFA;VENTOLIN HFA) 108 (90 Base) MCG/ACT inhaler Inhale 1-2 puffs into the lungs every 6 (six) hours as needed for wheezing or shortness of breath.    .  ARIPiprazole (ABILIFY) 15 MG tablet Take 1 tablet (15 mg total) by mouth daily. 30 tablet 2  . diphenhydrAMINE (BENADRYL) 25 MG tablet Take 25 mg by mouth every 6 (six) hours as needed for itching.    Marland Kitchen FLUoxetine (PROZAC) 40 MG capsule Take 1 capsule (40 mg total) by mouth daily. 30 capsule 2  . fluticasone (FLONASE) 50 MCG/ACT nasal spray USE 2 SPRAYS IN EACH NOSTRIL EVERY DAY (Patient taking differently: USE 2 SPRAYS IN EACH NOSTRIL EVERY DAY AS NEEDED FOR ALLERGIES) 16 g 5  . HYDROCORTISONE EX Apply 1 application topically as needed (ECZEMA).    . hydrOXYzine (ATARAX/VISTARIL) 25 MG tablet Take 1 tablet (25 mg total) by mouth daily as needed for anxiety. 30 tablet 2  . ketorolac (TORADOL) 10 MG tablet Take 10 mg by mouth as needed for migraine.    . lamoTRIgine (LAMICTAL) 200 MG tablet Take 1 tablet (200 mg total) by mouth at bedtime. 30 tablet 2  . lisdexamfetamine (VYVANSE) 40 MG capsule Take 1 capsule (40 mg total) by mouth daily. 30 capsule 0  . loratadine (CLARITIN) 10 MG tablet Take 10 mg by mouth as needed for allergies.    . metoprolol succinate (TOPROL-XL) 25 MG 24 hr tablet TAKE 1/2 TABLET BY MOUTH AT BEDTIME FOR palpitations 15 tablet 6  . Multiple Vitamin (MULTIVITAMIN WITH MINERALS) TABS tablet Take 1 tablet by mouth daily.    . ondansetron (ZOFRAN) 4 MG tablet Take 1 tablet (4 mg total) by mouth every 8 (eight) hours as needed for nausea or vomiting. 10 tablet 0  . traZODone (DESYREL) 50  MG tablet Take 1 tablet (50 mg total) by mouth at bedtime as needed for sleep. 30 tablet 0   No current facility-administered medications for this visit.      Musculoskeletal: Strength & Muscle Tone: within normal limits Gait & Station: normal Patient leans: N/A  Psychiatric Specialty Exam: ROS  Blood pressure 126/74, pulse 88, height 5\' 5"  (1.651 m), weight 218 lb (98.9 kg).There is no height or weight on file to calculate BMI.  General Appearance: Casual and Pleasant  Eye Contact:   Good  Speech:  Clear and Coherent  Volume:  Normal  Mood:  Anxious  Affect:  Appropriate  Thought Process:  Goal Directed  Orientation:  Full (Time, Place, and Person)  Thought Content: Logical   Suicidal Thoughts:  No  Homicidal Thoughts:  No  Memory:  Immediate;   Good Recent;   Good Remote;   Good  Judgement:  Good  Insight:  Good  Psychomotor Activity:  Normal  Concentration:  Concentration: Good and Attention Span: Good  Recall:  Good  Fund of Knowledge: Good  Language: Good  Akathisia:  No  Handed:  Right  AIMS (if indicated): not done  Assets:  Communication Skills Desire for Improvement Housing Resilience  ADL's:  Intact  Cognition: WNL  Sleep:  Good   Screenings: AIMS     Admission (Discharged) from 01/26/2017 in [facility]  AIMS Total Score  0    AUDIT     Admission (Discharged) from 01/26/2017 in [facility]  Alcohol Use Disorder Identification Test Final Score (AUDIT)  0    Mini-Mental     Office Visit from 07/31/2016 in [facility]  Total Score (max 30 points )  29    PHQ2-9     Office Visit from 03/02/2017 in [facility]  PHQ-2 Total Score  2  PHQ-9 Total Score  11       Assessment and Plan: Bipolar disorder type I.  Attention deficit disorder, inattentive type.  Alcohol abuse in partial remission.  Discussed her alcohol use and relapse.  Patient is trying to stay sober from drinking.  She feel AA and sponsor is a big help.  We also talked about her visit with the daughter more frequent and she is talking to her brother-in-law and that has been very helpful.  She feels her current medicine is working and she does not want to change.  We discussed to look for job option to keep herself busy and she agreed.  After the holidays she will actively search further job.  I will continue Vyvanse 40 mg daily, Abilify 15 mg daily, Prozac 40 mg daily and Lamictal  200 mg daily.  She takes rarely Vistaril to help her insomnia.  Discussed medication side effects and benefits specially stimulant because interaction with alcohol.  Encouraged to keep [place] meetings.  Recommended to call us back if she is any question or any concern.  Follow-up in 3 months.   Kathlee Nations, MD 03/04/2018, 8:55 AM

## 2018-03-22 ENCOUNTER — Other Ambulatory Visit (HOSPITAL_COMMUNITY): Payer: Self-pay | Admitting: Psychiatry

## 2018-03-22 DIAGNOSIS — F3131 Bipolar disorder, current episode depressed, mild: Secondary | ICD-10-CM

## 2018-04-26 ENCOUNTER — Telehealth (HOSPITAL_COMMUNITY): Payer: Self-pay

## 2018-04-26 DIAGNOSIS — F900 Attention-deficit hyperactivity disorder, predominantly inattentive type: Secondary | ICD-10-CM

## 2018-04-26 DIAGNOSIS — F3131 Bipolar disorder, current episode depressed, mild: Secondary | ICD-10-CM

## 2018-04-26 MED ORDER — LISDEXAMFETAMINE DIMESYLATE 40 MG PO CAPS: 40.0000 mg | ORAL_CAPSULE | Freq: Every day | ORAL | 0 refills | Status: DC

## 2018-04-26 MED ORDER — TRAZODONE HCL 50 MG PO TABS: 50.0000 mg | ORAL_TABLET | Freq: Every evening | ORAL | 0 refills | Status: DC | PRN

## 2018-04-26 NOTE — Telephone Encounter (Signed)
[facility]

## 2018-04-26 NOTE — Telephone Encounter (Signed)
Patient requesting a refill of Vyvanse and Trazadone.  Please send refills to  [facility].

## 2018-04-29 ENCOUNTER — Telehealth (HOSPITAL_COMMUNITY): Payer: Self-pay | Admitting: *Deleted

## 2018-04-29 NOTE — Telephone Encounter (Signed)
She should try good [email or web address] for better price.

## 2018-04-29 NOTE — Telephone Encounter (Signed)
Prior authorization for Vyvanse received. Called [facility] was told that patient only has family planning Medicaid and medications would not be covered under this plan. Called pharmacy to verify insurance and they only show Medicaid. Cost for medication would be $258.76. Prescription has expired at pharmacy and they will need a new prescription or a different medication.

## 2018-05-05 NOTE — Telephone Encounter (Signed)
I called the patient and left a voicemail letting her know.

## 2018-06-04 ENCOUNTER — Ambulatory Visit (HOSPITAL_COMMUNITY): Payer: Self-pay | Attending: Internal Medicine

## 2018-06-04 ENCOUNTER — Encounter (HOSPITAL_COMMUNITY): Payer: Self-pay | Admitting: Psychiatry

## 2018-06-04 ENCOUNTER — Ambulatory Visit (INDEPENDENT_AMBULATORY_CARE_PROVIDER_SITE_OTHER): Payer: Self-pay | Admitting: Psychiatry

## 2018-06-04 DIAGNOSIS — Q249 Congenital malformation of heart, unspecified: Secondary | ICD-10-CM | POA: Insufficient documentation

## 2018-06-04 DIAGNOSIS — F900 Attention-deficit hyperactivity disorder, predominantly inattentive type: Secondary | ICD-10-CM

## 2018-06-04 DIAGNOSIS — F3131 Bipolar disorder, current episode depressed, mild: Secondary | ICD-10-CM

## 2018-06-04 LAB — ECHOCARDIOGRAM COMPLETE
Height: 65 in
Weight: 3472 oz

## 2018-06-04 MED ORDER — HYDROXYZINE HCL 25 MG PO TABS: 25.0000 mg | ORAL_TABLET | Freq: Every day | ORAL | 2 refills | Status: DC | PRN

## 2018-06-04 MED ORDER — LISDEXAMFETAMINE DIMESYLATE 40 MG PO CAPS: 40.0000 mg | ORAL_CAPSULE | Freq: Every day | ORAL | 0 refills | Status: DC

## 2018-06-04 MED ORDER — ARIPIPRAZOLE 15 MG PO TABS: 15.0000 mg | ORAL_TABLET | Freq: Every day | ORAL | 2 refills | Status: DC

## 2018-06-04 MED ORDER — LAMOTRIGINE 200 MG PO TABS: 200.0000 mg | ORAL_TABLET | Freq: Every day | ORAL | 2 refills | Status: DC

## 2018-06-04 MED ORDER — FLUOXETINE HCL 40 MG PO CAPS: 40.0000 mg | ORAL_CAPSULE | Freq: Every day | ORAL | 2 refills | Status: DC

## 2018-06-04 NOTE — Progress Notes (Signed)
[facility] MD/PA/NP OP Progress Note  06/04/2018 8:23 AM Kristin Stephens  MRN:  [id number]  Chief Complaint: I am doing good.  I started a new relationship which is going well.  HPI: Kristin Stephens came for her appointment.  She is taking her medication except Vyvanse as she has unable to find a cheaper place.  She is not taking Vyvanse for past few days.  She did not notice a significant lack of attention or focus.  She is sleeping good.  She does not take trazodone.  Today she is anxious because she has cardiology follow-up in few weeks.  Patient has ASD.  She feels proud that she is not drinking and going to [place] regularly.  She denies any irritability, anger, mania or any psychosis.  She is in contact with her daughter on a regular basis.  Her daughter has now iPad and she is able to do face time without any problem.  She started new relationship is going very well.  She is on injectable contraceptive because she does not want any kids.  She denies any mania, psychosis, suicidal thoughts or homicidal thought.  She wants to keep on her current psychiatric medication.  However she need a hard copy of 5 and so she can find a place where she can get cheaper.  Visit Diagnosis:    ICD-10-CM   1. Attention deficit hyperactivity disorder (ADHD), predominantly inattentive type F90.0 lisdexamfetamine (VYVANSE) 40 MG capsule  2. Bipolar affective disorder, currently depressed, mild (HCC) F31.31 lamoTRIgine (LAMICTAL) 200 MG tablet    ARIPiprazole (ABILIFY) 15 MG tablet    hydrOXYzine (ATARAX/VISTARIL) 25 MG tablet    FLUoxetine (PROZAC) 40 MG capsule    Past Psychiatric History: Reviewed. H/O mood swing, depression, anger, overdose and slashing her wrist.  Did IOP in 2016. Inpatient in October 2018.  H/O drinking.  Took Adderall, Ativan and gabapentin. Tried Wellbutrin but limited response.   Past Medical History:  Past Medical History:  Diagnosis Date  . ADHD (attention deficit hyperactivity disorder)   . Anxiety    . Asthma    daily and prn inhalers  . Bipolar affective disorder ([place])   . Dental crowns present   . Depression   . Deviated nasal septum 05/2012  . Eczema    right leg  . Esophageal reflux   . Fibromyalgia   . Hypertension   . IBS (irritable bowel syndrome)   . Migraine headache   . Nasal turbinate hypertrophy 05/2012   bilateral  . OCD (obsessive compulsive disorder)   . TMJ syndrome     Past Surgical History:  Procedure Laterality Date  . ASD AND VSD REPAIR  1989  . CARDIAC CATHETERIZATION  06/18/2006  . CESAREAN SECTION  04/12/2009  . COARCTATION OF AORTA REPAIR  1989  . COLONOSCOPY N/A 05/20/2013   Procedure: COLONOSCOPY;  Surgeon: Danie Binder, MD;  [facility];  Service: Endoscopy;  Laterality: N/A;  115-moved to [employer] notified pt  . ESOPHAGOGASTRODUODENOSCOPY  04/04/2008     Normal esophagus/Mild patchy erythema in the antrum/ Normal duodenal bulb, normal small bowel biopsy  . FLEXIBLE SIGMOIDOSCOPY  04/04/2008     Small internal hemorrhoids (poor bowel prep)  . HEMORRHOID BANDING N/A 05/20/2013   Procedure: HEMORRHOID BANDING;  Surgeon: Danie Binder, MD;  [facility];  Service: Endoscopy;  Laterality: N/A;  . NASAL SEPTOPLASTY W/ TURBINOPLASTY Bilateral 05/31/2012   Procedure: NASAL SEPTOPLASTY WITH TURBINATE REDUCTION;  Surgeon: Ascencion Dike, MD;  Location: [facility];  Service: ENT;  Laterality: Bilateral;    Family Psychiatric History: Reviewed.  Family History:  Family History  Problem Relation Age of Onset  . Hyperlipidemia Mother   . Hypertension Mother   . Depression Mother   . Physical abuse Mother   . Sexual abuse Mother        possibly  . Sleep apnea Mother   . Emphysema Paternal Grandmother   . Heart disease Paternal Grandmother   . Bipolar disorder Paternal Grandmother   . Dementia Paternal Grandmother   . Heart disease Maternal Grandmother   . Dementia Maternal Grandmother   . Stroke Maternal  Grandfather   . Prostate cancer Maternal Grandfather   . Personality disorder Father   . Bipolar disorder Father   . Alcohol abuse Father   . ADD / ADHD Sister   . Anxiety disorder Brother   . ADD / ADHD Brother   . ADD / ADHD Brother   . Seizures Daughter   . Drug abuse Neg Hx   . OCD Neg Hx   . Paranoid behavior Neg Hx   . Schizophrenia Neg Hx     Social History:  Social History   Socioeconomic History  . Marital status: Single    Spouse name: Not on file  . Number of children: 1  . Years of education: GED  . Highest education level: Not on file  Occupational History  . Occupation: Engineer, water  Social Needs  . Financial resource strain: Not on file  . Food insecurity:    Worry: Not on file    Inability: Not on file  . Transportation needs:    Medical: Not on file    Non-medical: Not on file  Tobacco Use  . Smoking status: Current Every Day Smoker    Packs/day: 0.50    Years: 10.00    Pack years: 5.00    Types: Cigarettes    Start date: 02/27/2002  . Smokeless tobacco: Never Used  . Tobacco comment: has cut back to less than 1/2 a pack and trying to quit  Substance and Sexual Activity  . 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 Drug use: No    Comment: marijuana and pain pills; 12/08/07-smoked x 1 month ago- this was the first time in 2.years.  Morphine Sulfate and oxycontin - none since age 77  . Sexual activity: Not Currently    Birth control/protection: Injection, Spermicide  Lifestyle  . Physical activity:    Days per week: Not on file    Minutes per session: Not on file  . Stress: Not on file  Relationships  . Social connections:    Talks on phone: Not on file    Gets together: Not on file    Attends religious service: Not on file    Active member of club or organization: Not on file    Attends meetings of clubs or organizations: Not on file    Relationship status: Not on file   Other Topics Concern  . Not on file  Social History Narrative   Lives with 3 roommates   LIKES TO WATCH DR. PIMPLE POPPER. AT AGE 71 LIKED TO PICK HER NOSE AND EAT THE DRIED MUCOUS.    Caffeine: coffee, soda, occasional Red Bull    Allergies:  Allergies  Allergen Reactions  . Sulfonamide Derivatives Swelling    SWELLING OF EYES  WITH OPHTHALMIC SULFA  . Coconut Flavor Rash    Metabolic Disorder Labs: Lab Results  Component Value Date   HGBA1C 5.0 07/17/2014   MPG 97 08/28/2011   No results found for: PROLACTIN Lab Results  Component Value Date   CHOL 181 06/04/2017   TRIG 120 06/04/2017   HDL 49 06/04/2017   CHOLHDL 3.7 06/04/2017   VLDL 14 02/06/2007   LDLCALC 108 (H) 06/04/2017   LDLCALC  02/06/2007    92        Total Cholesterol/HDL:CHD Risk Coronary Heart Disease Risk Table                     Men   Women  1/2 Average Risk   3.4   3.3   Lab Results  Component Value Date   TSH 1.299 03/31/2008   TSH 1.424 Test methodology is 3rd generation TSH 02/05/2007    Therapeutic Level Labs: No results found for: LITHIUM No results found for: VALPROATE No components found for:  CBMZ  Current Medications: Current Outpatient Medications  Medication Sig Dispense Refill  . acetaminophen (TYLENOL) 500 MG tablet Take 1,000-2,000 mg by mouth every 6 (six) hours as needed for mild pain, moderate pain, fever or headache.    . albuterol (PROVENTIL HFA;VENTOLIN HFA) 108 (90 Base) MCG/ACT inhaler Inhale 1-2 puffs into the lungs every 6 (six) hours as needed for wheezing or shortness of breath.    . ARIPiprazole (ABILIFY) 15 MG tablet Take 1 tablet (15 mg total) by mouth daily. 30 tablet 2  . diphenhydrAMINE (BENADRYL) 25 MG tablet Take 25 mg by mouth every 6 (six) hours as needed for itching.    Marland Kitchen FLUoxetine (PROZAC) 40 MG capsule Take 1 capsule (40 mg total) by mouth daily. 30 capsule 2  . fluticasone (FLONASE) 50 MCG/ACT nasal spray USE 2 SPRAYS IN EACH NOSTRIL EVERY DAY  (Patient taking differently: USE 2 SPRAYS IN EACH NOSTRIL EVERY DAY AS NEEDED FOR ALLERGIES) 16 g 5  . HYDROCORTISONE EX Apply 1 application topically as needed (ECZEMA).    . hydrOXYzine (ATARAX/VISTARIL) 25 MG tablet Take 1 tablet (25 mg total) by mouth daily as needed for anxiety. 30 tablet 2  . ketorolac (TORADOL) 10 MG tablet Take 10 mg by mouth as needed for migraine.    . lamoTRIgine (LAMICTAL) 200 MG tablet Take 1 tablet (200 mg total) by mouth at bedtime. 30 tablet 2  . lisdexamfetamine (VYVANSE) 40 MG capsule Take 1 capsule (40 mg total) by mouth daily. 30 capsule 0  . loratadine (CLARITIN) 10 MG tablet Take 10 mg by mouth as needed for allergies.    . metoprolol succinate (TOPROL-XL) 25 MG 24 hr tablet TAKE 1/2 TABLET BY MOUTH AT BEDTIME FOR palpitations 15 tablet 6  . Multiple Vitamin (MULTIVITAMIN WITH MINERALS) TABS tablet Take 1 tablet by mouth daily.    . ondansetron (ZOFRAN) 4 MG tablet Take 1 tablet (4 mg total) by mouth every 8 (eight) hours as needed for nausea or vomiting. 10 tablet 0  . traZODone (DESYREL) 50 MG tablet Take 1 tablet (50 mg total) by mouth at bedtime as needed for sleep. 30 tablet 0   No current facility-administered medications for this visit.      Musculoskeletal: Strength & Muscle Tone: within normal limits Gait & Station: normal Patient leans: N/A  Psychiatric Specialty Exam: ROS  Blood pressure 122/68, height 5\' 5"  (1.651 m), weight 217 lb (98.4 kg).Body mass index is 36.11 kg/m.  General  Appearance: Casual  Eye Contact:  Good  Speech:  Clear and Coherent  Volume:  Normal  Mood:  Anxious  Affect:  Congruent  Thought Process:  Goal Directed  Orientation:  Full (Time, Place, and Person)  Thought Content: WDL   Suicidal Thoughts:  No  Homicidal Thoughts:  No  Memory:  Immediate;   Good Recent;   Good Remote;   Good  Judgement:  Good  Insight:  Good  Psychomotor Activity:  Normal  Concentration:  Concentration: Fair and Attention Span:  Fair  Recall:  Good  Fund of Knowledge: Good  Language: Good  Akathisia:  No  Handed:  Right  AIMS (if indicated): not done  Assets:  Communication Skills Desire for Improvement Housing Resilience Transportation  ADL's:  Intact  Cognition: WNL  Sleep:  Good   Screenings: AIMS     Admission (Discharged) from 01/26/2017 in [facility]  AIMS Total Score  0    AUDIT     Admission (Discharged) from 01/26/2017 in [facility]  Alcohol Use Disorder Identification Test Final Score (AUDIT)  0    Mini-Mental     Office Visit from 07/31/2016 in [facility]  Total Score (max 30 points )  29    PHQ2-9     Office Visit from 03/02/2017 in [facility]  PHQ-2 Total Score  2  PHQ-9 Total Score  11       Assessment and Plan: Bipolar disorder type I.  Attention deficit disorder, inattentive type.  Patient doing better on her current medication.  She is concerned as she cannot afford Vyvanse but like to have a hard copy so she can find other places where she can get cheaper.  I recommend try [employer] or [employer].  She like to keep her medication as it is since it is working well.  She started new relationship and also she is now on injectable contraceptive.  She has no side effects.  I will continue Abilify 15 mg daily, Prozac 40 mg daily, Lamictal 200 mg daily and hydroxyzine as needed for anxiety.  Encouraged to continue to keep [place] meetings.  Recommended to call us back if is any question or any concern.  I will see her again in 3 months.   Kathlee Nations, MD 06/04/2018, 8:23 AM

## 2018-06-21 ENCOUNTER — Telehealth: Payer: Self-pay | Admitting: Internal Medicine

## 2018-06-21 NOTE — Telephone Encounter (Signed)
Called pt   Left Voice Mail Given corona virus would recomm rescheduling this routine f/u for September 2020 Call back if problems develop

## 2018-06-22 NOTE — Telephone Encounter (Signed)
Called pt to cancel 3/27 appt per Dr. Harrington Challenger Pt reports no acute cardiac symptoms at this time Will try to reschedule in September Advised pt to call back if any new symptoms present Pt verbalized understanding Will route to cancel pool

## 2018-06-25 ENCOUNTER — Ambulatory Visit: Payer: Self-pay | Admitting: Internal Medicine

## 2018-07-01 ENCOUNTER — Telehealth (HOSPITAL_COMMUNITY): Payer: Self-pay

## 2018-07-01 DIAGNOSIS — F3131 Bipolar disorder, current episode depressed, mild: Secondary | ICD-10-CM

## 2018-07-01 DIAGNOSIS — F900 Attention-deficit hyperactivity disorder, predominantly inattentive type: Secondary | ICD-10-CM

## 2018-07-01 NOTE — Telephone Encounter (Signed)
Patient is calling for a refill on her Vyvanse and Trazodone. Please send to [facility]

## 2018-07-02 MED ORDER — LISDEXAMFETAMINE DIMESYLATE 40 MG PO CAPS: 40.0000 mg | ORAL_CAPSULE | Freq: Every day | ORAL | 0 refills | Status: DC

## 2018-07-02 MED ORDER — TRAZODONE HCL 50 MG PO TABS: 50.0000 mg | ORAL_TABLET | Freq: Every evening | ORAL | 0 refills | Status: DC | PRN

## 2018-07-02 NOTE — Telephone Encounter (Signed)
Send to [facility].

## 2018-07-08 ENCOUNTER — Telehealth (HOSPITAL_COMMUNITY): Payer: Self-pay | Admitting: Psychiatry

## 2018-07-08 NOTE — Telephone Encounter (Signed)
D:  Pt had called on 07-06-18 re: paperwork.  Placed call to pt for clarification.  According to pt, she has previously called re: needing a letter stating that she has zero income coming in.  Also, mentioned she has medication assistance forms to be completed.  States she will drop the forms off tomorrow or 07-12-18.  A:  Informed pt that Probation officer will discuss with Dr. Adele Schilder.  R:  Pt receptive.

## 2018-07-09 ENCOUNTER — Telehealth (HOSPITAL_COMMUNITY): Payer: Self-pay | Admitting: Psychiatry

## 2018-07-09 NOTE — Telephone Encounter (Signed)
D:  Spoke to Dr. Adele Schilder re: pt's requests.  Dr. Adele Schilder is declining to write the letter re: pt having zero income until she brings in the patient assistance forms so he can clarify if the letter is needed from him.  States he can't verify that she has zero income, but her accountant can.  A:  Await pt to drop the forms off today or 07-12-18 at the office.  Inform pt.  R:  Pt receptive.

## 2018-08-02 ENCOUNTER — Other Ambulatory Visit (HOSPITAL_COMMUNITY): Payer: Self-pay

## 2018-08-02 ENCOUNTER — Telehealth (HOSPITAL_COMMUNITY): Payer: Self-pay

## 2018-08-02 DIAGNOSIS — F3131 Bipolar disorder, current episode depressed, mild: Secondary | ICD-10-CM

## 2018-08-02 DIAGNOSIS — F900 Attention-deficit hyperactivity disorder, predominantly inattentive type: Secondary | ICD-10-CM

## 2018-08-02 MED ORDER — LISDEXAMFETAMINE DIMESYLATE 40 MG PO CAPS: 40.0000 mg | ORAL_CAPSULE | Freq: Every day | ORAL | 0 refills | Status: DC

## 2018-08-02 MED ORDER — TRAZODONE HCL 50 MG PO TABS: 50.0000 mg | ORAL_TABLET | Freq: Every evening | ORAL | 0 refills | Status: DC | PRN

## 2018-08-02 NOTE — Telephone Encounter (Signed)
Patient is calling for a refill on Vyvanse, she uses [facility].

## 2018-08-02 NOTE — Telephone Encounter (Signed)
done

## 2018-09-01 ENCOUNTER — Other Ambulatory Visit: Payer: Self-pay | Admitting: Internal Medicine

## 2018-09-03 ENCOUNTER — Ambulatory Visit (HOSPITAL_COMMUNITY): Payer: Self-pay | Admitting: Psychiatry

## 2018-09-06 ENCOUNTER — Other Ambulatory Visit: Payer: Self-pay

## 2018-09-06 ENCOUNTER — Ambulatory Visit (INDEPENDENT_AMBULATORY_CARE_PROVIDER_SITE_OTHER): Payer: Self-pay | Admitting: Psychiatry

## 2018-09-06 ENCOUNTER — Encounter (HOSPITAL_COMMUNITY): Payer: Self-pay | Admitting: Psychiatry

## 2018-09-06 DIAGNOSIS — F3131 Bipolar disorder, current episode depressed, mild: Secondary | ICD-10-CM

## 2018-09-06 DIAGNOSIS — F1010 Alcohol abuse, uncomplicated: Secondary | ICD-10-CM

## 2018-09-06 DIAGNOSIS — F900 Attention-deficit hyperactivity disorder, predominantly inattentive type: Secondary | ICD-10-CM

## 2018-09-06 MED ORDER — HYDROXYZINE HCL 25 MG PO TABS: 25.0000 mg | ORAL_TABLET | Freq: Two times a day (BID) | ORAL | 2 refills | Status: DC | PRN

## 2018-09-06 MED ORDER — TRAZODONE HCL 50 MG PO TABS: 50.0000 mg | ORAL_TABLET | Freq: Every evening | ORAL | 0 refills | Status: DC | PRN

## 2018-09-06 MED ORDER — ARIPIPRAZOLE 15 MG PO TABS
15.0000 mg | ORAL_TABLET | Freq: Every day | ORAL | 2 refills | Status: DC
Start: 2018-09-06 — End: 2018-11-01

## 2018-09-06 MED ORDER — LISDEXAMFETAMINE DIMESYLATE 40 MG PO CAPS: 40.0000 mg | ORAL_CAPSULE | Freq: Every day | ORAL | 0 refills | Status: DC

## 2018-09-06 MED ORDER — FLUOXETINE HCL 40 MG PO CAPS: 40.0000 mg | ORAL_CAPSULE | Freq: Every day | ORAL | 2 refills | Status: DC

## 2018-09-06 MED ORDER — LAMOTRIGINE 200 MG PO TABS: 200.0000 mg | ORAL_TABLET | Freq: Every day | ORAL | 2 refills | Status: DC

## 2018-09-06 NOTE — Progress Notes (Signed)
Virtual Visit via Telephone Note  I connected with Kristin Stephens on 09/06/18 at  3:00 PM EDT by telephone and verified that I am speaking with the correct person using two identifiers.   I discussed the limitations, risks, security and privacy concerns of performing an evaluation and management service by telephone and the availability of in person appointments. I also discussed with the patient that there may be a patient responsible charge related to this service. The patient expressed understanding and agreed to proceed.   History of Present Illness: Patient was evaluated by phone session.  She is compliant with medication other than trazodone which she takes only as needed and she feels current medicine is working well.  She is disappointed because she is not able to attend AA meeting but feels proud that she is been sober from drinking for more than 7 months.  Sometimes she feels nervous and anxious and wondering if hydroxyzine dose can be increased.  She is sleeping better.  Relationship with a new person is going very well.  She feels very secure and supported.  She is in touch with her daughter on a regular basis.  She denies any mania, psychosis, hallucination.  She described any mood swings irritability or any highs or lows.  Her attention and concentration is good.  She is able to do multitasking with the help of the Vyvanse.  She has no rash, itching tremors or shakes.  She wants to continue Vyvanse, Abilify, Lamictal, Prozac and hydroxyzine and trazodone as needed.  Her appetite is okay.  Her energy level is good.  She reported her weight is a stable.  Past Psychiatric History: Reviewed. H/O mood swing, depression, anger, overdose and slashing her wrist.  Did IOP in 2016. Inpatient in October 2018. H/O ETOH. Took Adderall, Ativan, Gabapentin, Wellbutrin but limited response.   Past Psychiatric History: Reviewed. H/O mood swing, depression, anger, overdose and slashing her wrist.  Did  IOP in 2016. Inpatient in October 2018. H/O drinking.  Took Adderall, Ativan and gabapentin. TriedWellbutrin but limited response.   Psychiatric Specialty Exam: Physical Exam  ROS  There were no vitals taken for this visit.There is no height or weight on file to calculate BMI.  General Appearance: NA  Eye Contact:  NA  Speech:  Clear and Coherent  Volume:  Normal  Mood:  Euthymic  Affect:  NA  Thought Process:  Goal Directed  Orientation:  Full (Time, Place, and Person)  Thought Content:  Logical  Suicidal Thoughts:  No  Homicidal Thoughts:  No  Memory:  Immediate;   Good Recent;   Good Remote;   Good  Judgement:  Good  Insight:  Good  Psychomotor Activity:  NA  Concentration:  Concentration: Good and Attention Span: Good  Recall:  Good  Fund of Knowledge:  Good  Language:  Good  Akathisia:  No  Handed:  Right  AIMS (if indicated):     Assets:  Communication Skills Desire for Improvement Housing Physical Health Resilience Social Support Transportation  ADL's:  Intact  Cognition:  WNL  Sleep:   ok      Assessment and Plan: Bipolar disorder type I.  Attention deficit disorder, inattentive type.  Alcohol use.  Patient is doing better on her current medication.  Sometimes she feels anxious.  Recommend to try hydroxyzine 25 mg twice a day.  [facility] and she cannot afford all her medication.  Discussed medication side effects  and benefits.  She has no rash, itching, tremors or shakes.  I will continue Abilify 15 mg daily, Prozac 40 mg daily, Lamictal 200 mg daily, hydroxyzine 25 mg now twice a day and trazodone as needed for insomnia.  Recommended to call us back if she has any question or any concern.  We discussed that she should try virtual AA meeting and she promised that she will look into that.  Follow-up in 3 months.  Follow Up Instructions:    I discussed the assessment and treatment plan with the  patient. The patient was provided an opportunity to ask questions and all were answered. The patient agreed with the plan and demonstrated an understanding of the instructions.   The patient was advised to call back or seek an in-person evaluation if the symptoms worsen or if the condition fails to improve as anticipated.  I provided 15 minutes of non-face-to-face time during this encounter.   Kathlee Nations, MD

## 2018-09-29 ENCOUNTER — Telehealth (HOSPITAL_COMMUNITY): Payer: Self-pay

## 2018-09-29 ENCOUNTER — Other Ambulatory Visit (HOSPITAL_COMMUNITY): Payer: Self-pay | Admitting: Psychiatry

## 2018-09-29 DIAGNOSIS — F900 Attention-deficit hyperactivity disorder, predominantly inattentive type: Secondary | ICD-10-CM

## 2018-09-29 MED ORDER — LISDEXAMFETAMINE DIMESYLATE 40 MG PO CAPS: 40.0000 mg | ORAL_CAPSULE | Freq: Every day | ORAL | 0 refills | Status: DC

## 2018-09-29 NOTE — Telephone Encounter (Signed)
sent 

## 2018-09-29 NOTE — Telephone Encounter (Signed)
Arfeen Patient:   Patient is calling for a refill on her Vyvanse, she states she will not be out until next week, she is just being proactive. Patient has a follow up in September and last filled on 6/8

## 2018-10-28 ENCOUNTER — Telehealth (HOSPITAL_COMMUNITY): Payer: Self-pay

## 2018-10-28 DIAGNOSIS — F900 Attention-deficit hyperactivity disorder, predominantly inattentive type: Secondary | ICD-10-CM

## 2018-10-28 DIAGNOSIS — F3131 Bipolar disorder, current episode depressed, mild: Secondary | ICD-10-CM

## 2018-10-28 MED ORDER — LISDEXAMFETAMINE DIMESYLATE 40 MG PO CAPS: 40.0000 mg | ORAL_CAPSULE | Freq: Every day | ORAL | 0 refills | Status: DC

## 2018-10-28 MED ORDER — TRAZODONE HCL 50 MG PO TABS
50.0000 mg | ORAL_TABLET | Freq: Every evening | ORAL | 0 refills | Status: DC | PRN
Start: 2018-10-28 — End: 2018-12-01

## 2018-10-28 NOTE — Telephone Encounter (Signed)
Patient is calling for a refill on her Trazodone and her Vyvanse.

## 2018-10-28 NOTE — Telephone Encounter (Signed)
done

## 2018-11-01 ENCOUNTER — Other Ambulatory Visit (HOSPITAL_COMMUNITY): Payer: Self-pay | Admitting: Psychiatry

## 2018-11-01 DIAGNOSIS — F3131 Bipolar disorder, current episode depressed, mild: Secondary | ICD-10-CM

## 2018-12-01 ENCOUNTER — Telehealth (HOSPITAL_COMMUNITY): Payer: Self-pay

## 2018-12-01 DIAGNOSIS — F900 Attention-deficit hyperactivity disorder, predominantly inattentive type: Secondary | ICD-10-CM

## 2018-12-01 DIAGNOSIS — F3131 Bipolar disorder, current episode depressed, mild: Secondary | ICD-10-CM

## 2018-12-01 MED ORDER — TRAZODONE HCL 50 MG PO TABS: 50.0000 mg | ORAL_TABLET | Freq: Every evening | ORAL | 0 refills | Status: DC | PRN

## 2018-12-01 MED ORDER — LISDEXAMFETAMINE DIMESYLATE 40 MG PO CAPS: 40.0000 mg | ORAL_CAPSULE | Freq: Every day | ORAL | 0 refills | Status: DC

## 2018-12-01 NOTE — Telephone Encounter (Signed)
done

## 2018-12-01 NOTE — Telephone Encounter (Signed)
Patient called requesting refills on her Vyvanse 40mg  and her Trazodone 50mg . She has a scheduled appointment on 12/07/18. Please review and advise. Thank you.

## 2018-12-07 ENCOUNTER — Other Ambulatory Visit: Payer: Self-pay

## 2018-12-07 ENCOUNTER — Ambulatory Visit (INDEPENDENT_AMBULATORY_CARE_PROVIDER_SITE_OTHER): Payer: Medicaid Other | Admitting: Psychiatry

## 2018-12-07 ENCOUNTER — Encounter (HOSPITAL_COMMUNITY): Payer: Self-pay | Admitting: Psychiatry

## 2018-12-07 DIAGNOSIS — F3131 Bipolar disorder, current episode depressed, mild: Secondary | ICD-10-CM

## 2018-12-07 DIAGNOSIS — F900 Attention-deficit hyperactivity disorder, predominantly inattentive type: Secondary | ICD-10-CM

## 2018-12-07 DIAGNOSIS — F411 Generalized anxiety disorder: Secondary | ICD-10-CM

## 2018-12-07 MED ORDER — LISDEXAMFETAMINE DIMESYLATE 40 MG PO CAPS: 40.0000 mg | ORAL_CAPSULE | Freq: Every day | ORAL | 0 refills | Status: DC

## 2018-12-07 MED ORDER — LAMOTRIGINE 200 MG PO TABS: 200.0000 mg | ORAL_TABLET | Freq: Every day | ORAL | 2 refills | Status: DC

## 2018-12-07 MED ORDER — FLUOXETINE HCL 40 MG PO CAPS: 40.0000 mg | ORAL_CAPSULE | Freq: Every day | ORAL | 2 refills | Status: DC

## 2018-12-07 MED ORDER — TRAZODONE HCL 50 MG PO TABS: 50.0000 mg | ORAL_TABLET | Freq: Every evening | ORAL | 0 refills | Status: DC | PRN

## 2018-12-07 MED ORDER — HYDROXYZINE HCL 25 MG PO TABS: 25.0000 mg | ORAL_TABLET | Freq: Two times a day (BID) | ORAL | 2 refills | Status: DC | PRN

## 2018-12-07 MED ORDER — ARIPIPRAZOLE 15 MG PO TABS: 15.0000 mg | ORAL_TABLET | Freq: Every day | ORAL | 2 refills | Status: DC

## 2018-12-07 NOTE — Progress Notes (Signed)
Virtual Visit via Telephone Note  I connected with Kristin Stephens on 12/07/18 at  8:40 AM EDT by telephone and verified that I am speaking with the correct person using two identifiers.   I discussed the limitations, risks, security and privacy concerns of performing an evaluation and management service by telephone and the availability of in person appointments. I also discussed with the patient that there may be a patient responsible charge related to this service. The patient expressed understanding and agreed to proceed.   History of Present Illness: Patient was evaluated by phone session.  She is doing much better on her medication.  She reported her relationship with the boyfriend is also going very well.  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  Few days ago she visited her daughter in [place] and she had a very good trip.  She surprised her daughter and she had a good time.  There are nights when she has difficulty sleeping and nightmares but she is not sure why.  She reported her mood is mostly stable but there are times when she is irritable when she do not sleep well.  We started hydroxyzine on her last visit to help her anxiety and is helping her a lot.  Her anxiety is much better.  She denies any mood swing, highs and lows, mania or any impulsive behavior.  Her attention, concentration and multitasking is good.  She is disappointed because her mother is no longer pay for her medication and she mentioned Vyvanse and is very expensive.  She is taking Abilify at night but other medicine during the daytime.  She has no rash, itching, tremors or shakes.  She denies any crying spells or any feeling of hopelessness or worthlessness.  Energy level is good.  Her appetite is okay.  She reported her weight is a stable.  She has upcoming appointment to see cardiologist for her VSD.  She is taking metoprolol which is working well for her.  She is very pleased that she  has been sober from drinking for 10 months.  She feels that she does not need AA meetings since she can stay sober on her own with the help of the medication.     Past Psychiatric History:Reviewed. H/Omood swing, depression, anger,overdoseandslashing her wrist.Did IOP in 2016. Inpatientin October 2018. H/O ETOH. Took Adderall, Ativan, Gabapentin, Wellbutrin but limited response.    Psychiatric Specialty Exam: Physical Exam  ROS  There were no vitals taken for this visit.There is no height or weight on file to calculate BMI.  General Appearance: NA  Eye Contact:  NA  Speech:  Clear and Coherent  Volume:  Normal  Mood:  Euthymic  Affect:  NA  Thought Process:  Goal Directed  Orientation:  Full (Time, Place, and Person)  Thought Content:  WDL  Suicidal Thoughts:  No  Homicidal Thoughts:  No  Memory:  Immediate;   Good Recent;   Good Remote;   Good  Judgement:  Good  Insight:  Good  Psychomotor Activity:  NA  Concentration:  Concentration: Good and Attention Span: Good  Recall:  Good  Fund of Knowledge:  Good  Language:  Good  Akathisia:  No  Handed:  Right  AIMS (if indicated):     Assets:  Communication Skills Desire for Improvement Housing Resilience Social Support  ADL's:  Intact  Cognition:  WNL  Sleep:   fair      Assessment and Plan: Bipolar  disorder type I.  Attention deficit disorder, inattentive type.  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  Generalized anxiety disorder.  Discussed that she should try taking Abilify in the morning as it may help her nightmares and sleep better.  She is willing to try and hoping that she does not have to take trazodone every night.  We discussed Vyvanse since her mother refused to pay her medication.  I recommend that Vyvanse is helping her mood and I am afraid changing the Vyvanse to any of the stimulant may cause fluctuation of mood and she agreed to stay on Vyvanse.  She may apply for assistance and we are happy to fill  up the forms if needed.  Patient like to keep her medication since it is working.  She has no side effects including tremors shakes or any EPS.  She has upcoming annual appointment to see cardiologist for her VSD.  I will continue Prozac 40 mg daily, Lamictal 200 mg daily, hydroxyzine 25 mg twice a day, Abilify 15 mg in the morning and trazodone 50 mg as needed.  Recommended to call us back if she has any question or any concern.  We will consider blood work on her next appointment.  Recommended to call us back if she has any question or any concern.  Follow-up in 3 months.  Patient feels proud that she has been sober from drinking for months.  Follow Up Instructions:    I discussed the assessment and treatment plan with the patient. The patient was provided an opportunity to ask questions and all were answered. The patient agreed with the plan and demonstrated an understanding of the instructions.   The patient was advised to call back or seek an in-person evaluation if the symptoms worsen or if the condition fails to improve as anticipated.  I provided 20 minutes of non-face-to-face time during this encounter.   Kathlee Nations, MD

## 2018-12-26 ENCOUNTER — Encounter (HOSPITAL_COMMUNITY): Payer: Self-pay | Admitting: Emergency Medicine

## 2018-12-26 ENCOUNTER — Emergency Department (HOSPITAL_COMMUNITY)
Admission: EM | Admit: 2018-12-26 | Discharge: 2018-12-26 | Disposition: A | Discharge status: Home / Self Care | Payer: Medicaid Other | Attending: Emergency Medicine | Admitting: Emergency Medicine

## 2018-12-26 ENCOUNTER — Other Ambulatory Visit: Payer: Self-pay

## 2018-12-26 ENCOUNTER — Emergency Department (HOSPITAL_COMMUNITY): Payer: Medicaid Other

## 2018-12-26 DIAGNOSIS — F17210 Nicotine dependence, cigarettes, uncomplicated: Secondary | ICD-10-CM | POA: Insufficient documentation

## 2018-12-26 DIAGNOSIS — I10 Essential (primary) hypertension: Secondary | ICD-10-CM | POA: Insufficient documentation

## 2018-12-26 DIAGNOSIS — Z79899 Other long term (current) drug therapy: Secondary | ICD-10-CM | POA: Insufficient documentation

## 2018-12-26 DIAGNOSIS — R112 Nausea with vomiting, unspecified: Secondary | ICD-10-CM | POA: Insufficient documentation

## 2018-12-26 DIAGNOSIS — R1011 Right upper quadrant pain: Secondary | ICD-10-CM | POA: Insufficient documentation

## 2018-12-26 DIAGNOSIS — R109 Unspecified abdominal pain: Secondary | ICD-10-CM

## 2018-12-26 DIAGNOSIS — J45909 Unspecified asthma, uncomplicated: Secondary | ICD-10-CM | POA: Insufficient documentation

## 2018-12-26 LAB — CBC
HCT: 43.7 % (ref 36.0–46.0)
Hemoglobin: 14.1 g/dL (ref 12.0–15.0)
MCH: 29.4 pg (ref 26.0–34.0)
MCHC: 32.3 g/dL (ref 30.0–36.0)
MCV: 91 fL (ref 80.0–100.0)
Platelets: 306 10*3/uL (ref 150–400)
RBC: 4.8 MIL/uL (ref 3.87–5.11)
RDW: 13.1 % (ref 11.5–15.5)
WBC: 7.7 10*3/uL (ref 4.0–10.5)
nRBC: 0 % (ref 0.0–0.2)

## 2018-12-26 LAB — COMPREHENSIVE METABOLIC PANEL
ALT: 27 U/L (ref 0–44)
AST: 19 U/L (ref 15–41)
Albumin: 4.2 g/dL (ref 3.5–5.0)
Alkaline Phosphatase: 69 U/L (ref 38–126)
Anion gap: 8 (ref 5–15)
BUN: 11 mg/dL (ref 6–20)
CO2: 26 mmol/L (ref 22–32)
Calcium: 9.3 mg/dL (ref 8.9–10.3)
Chloride: 103 mmol/L (ref 98–111)
Creatinine, Ser: 0.92 mg/dL (ref 0.44–1.00)
GFR calc Af Amer: >60 mL/min (ref >60)
GFR calc non Af Amer: >60 mL/min (ref >60)
Glucose, Bld: 129 mg/dL — ABNORMAL HIGH (ref 70–99)
Potassium: 4.1 mmol/L (ref 3.5–5.1)
Sodium: 137 mmol/L (ref 135–145)
Total Bilirubin: 0.6 mg/dL (ref 0.3–1.2)
Total Protein: 7.1 g/dL (ref 6.5–8.1)

## 2018-12-26 LAB — URINALYSIS, ROUTINE W REFLEX MICROSCOPIC
Bilirubin Urine: NEGATIVE
Glucose, UA: NEGATIVE mg/dL
Hgb urine dipstick: NEGATIVE
Ketones, ur: NEGATIVE mg/dL
Leukocytes,Ua: NEGATIVE
Nitrite: NEGATIVE
Protein, ur: NEGATIVE mg/dL
Specific Gravity, Urine: 1.023 (ref 1.005–1.030)
pH: 6 (ref 5.0–8.0)

## 2018-12-26 LAB — PREGNANCY, URINE: Preg Test, Ur: NEGATIVE

## 2018-12-26 LAB — LIPASE, BLOOD: Lipase: 27 U/L (ref 11–51)

## 2018-12-26 MED ORDER — SODIUM CHLORIDE 0.9 % IV SOLN
8.0000 mg | Freq: Once | INTRAVENOUS | Status: AC
  Administered 2018-12-26: 15:00:00 8 mg via INTRAVENOUS
  Filled 2018-12-26: qty 4

## 2018-12-26 MED ORDER — FAMOTIDINE 20 MG PO TABS: 20.0000 mg | ORAL_TABLET | Freq: Two times a day (BID) | ORAL | 0 refills | Status: DC

## 2018-12-26 MED ORDER — SUCRALFATE 1 G PO TABS: 1.0000 g | ORAL_TABLET | Freq: Three times a day (TID) | ORAL | 0 refills | Status: DC

## 2018-12-26 NOTE — ED Notes (Signed)
Pt ambulatory to bathroom

## 2018-12-26 NOTE — ED Provider Notes (Signed)
[facility] DEPT Provider Note   CSN: [id number] Arrival date & time: 12/26/18  0931     History   Chief Complaint Chief Complaint  Patient presents with   Abdominal Pain   Urinary Frequency    HPI Kristin Stephens is a 33 y.o. female.     HPI Kristin Stephens is a 33 y.o. female presents to ED with complaint of abdominal pain, nausea, vomiting, diarrhea for a week. Pt reports right upper quadrant pain. States she was told she had "gallbladder issues." states she has seen specialist before but did not have her gallbladder out. Recently, she reports increased pain, especially with eating. She reports daily nausea and vomiting after she eats. She reports some diarrhea. She also reports urinary urgency and frequency. Denies vaginal symptoms. Denies pregnancy.   Past Medical History:  Diagnosis Date   ADHD (attention deficit hyperactivity disorder)    Anxiety    Asthma    daily and prn inhalers   Bipolar affective disorder ([place])    Dental crowns present    Depression    Deviated nasal septum 05/2012   Eczema    right leg   Esophageal reflux    Fibromyalgia    Hypertension    IBS (irritable bowel syndrome)    Migraine headache    Nasal turbinate hypertrophy 05/2012   bilateral   OCD (obsessive compulsive disorder)    TMJ syndrome     Patient Active Problem List   Diagnosis Date Noted   Intentional overdose of drug in tablet form ([place]) 01/26/2017   Bipolar 1 disorder, depressed, severe ([place]) 01/26/2017   Bipolar 2 disorder ([place]) 05/16/2014   Internal hemorrhoids with complication 99991111   Vasomotor rhinitis 04/15/2013   Gastritis without bleeding 03/10/2013   Anal fissure 03/10/2013   Obsessive compulsive disorder 03/07/2013   Fibromyalgia 02/06/2013   Detachment of glenoid labrum 01/13/2013   Difficulty in walking(719.7) 02/18/2012   Seasonal affective disorder ([place]) 11/25/2011   Asthma, intrinsic  08/28/2010   ATTENTION DEFICIT DISORDER, ADULT 01/19/2008   RHINITIS MEDICAMENTOSA 12/08/2007   ALLERGIC RHINITIS 12/08/2007   IBS 12/08/2007   ECZEMA 12/08/2007   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   Migraine headache 04/24/2007   Essential hypertension 04/24/2007   ASVD 04/24/2007   GERD 04/24/2007   VSD 04/24/2007   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    Past Surgical History:  Procedure Laterality Date   ASD AND VSD [place]  06/18/2006   CESAREAN SECTION  04/12/2009   COARCTATION OF AORTA REPAIR  1989   COLONOSCOPY N/A 05/20/2013   Procedure: COLONOSCOPY;  Surgeon: Danie Binder, MD;  [facility];  Service: Endoscopy;  Laterality: N/A;  115-moved to [place] notified pt   ESOPHAGOGASTRODUODENOSCOPY  04/04/2008     Normal esophagus/Mild patchy erythema in the antrum/ Normal duodenal bulb, normal small bowel biopsy   FLEXIBLE SIGMOIDOSCOPY  04/04/2008     Small internal hemorrhoids (poor bowel prep)   HEMORRHOID BANDING N/A 05/20/2013   Procedure: HEMORRHOID BANDING;  Surgeon: Danie Binder, MD;  Location: [facility];  Service: Endoscopy;  Laterality: N/A;   NASAL SEPTOPLASTY W/ TURBINOPLASTY Bilateral 05/31/2012   Procedure: NASAL SEPTOPLASTY WITH TURBINATE REDUCTION;  Surgeon: Ascencion Dike, MD;  Location: [facility];  Service: ENT;  Laterality: Bilateral;     OB History    Gravida  1   Para  1   Term  1  Preterm      AB      Living  1     SAB      TAB      Ectopic      Multiple      Live Births               Home Medications    Prior to Admission medications   Medication Sig Start Date End Date Taking? Authorizing Provider  acetaminophen (TYLENOL) 500 MG tablet Take 1,000-2,000 mg by mouth every 6 (six) hours as needed for mild pain, moderate pain, fever or headache.    [provider]  albuterol (PROVENTIL HFA;VENTOLIN HFA) 108 (90 Base) MCG/ACT inhaler  Inhale 1-2 puffs into the lungs every 6 (six) hours as needed for wheezing or shortness of breath.    [provider]  ARIPiprazole (ABILIFY) 15 MG tablet Take 1 tablet (15 mg total) by mouth daily. 12/07/18   Arfeen, Arlyce Harman, MD  FLUoxetine (PROZAC) 40 MG capsule Take 1 capsule (40 mg total) by mouth daily. 12/07/18   Arfeen, Arlyce Harman, MD  fluticasone (FLONASE) 50 MCG/ACT nasal spray USE 2 SPRAYS IN EACH NOSTRIL EVERY DAY Patient taking differently: USE 2 SPRAYS IN EACH NOSTRIL EVERY DAY AS NEEDED FOR ALLERGIES 09/21/17   Mikey Kirschner, MD  HYDROCORTISONE EX Apply 1 application topically as needed (ECZEMA).    [provider]  hydrOXYzine (ATARAX/VISTARIL) 25 MG tablet Take 1 tablet (25 mg total) by mouth 2 (two) times daily as needed for anxiety. 12/07/18   Arfeen, Arlyce Harman, MD  lamoTRIgine (LAMICTAL) 200 MG tablet Take 1 tablet (200 mg total) by mouth at bedtime. 12/07/18   Arfeen, Arlyce Harman, MD  lisdexamfetamine (VYVANSE) 40 MG capsule Take 1 capsule (40 mg total) by mouth daily. 12/07/18   Arfeen, Arlyce Harman, MD  loratadine (CLARITIN) 10 MG tablet Take 10 mg by mouth as needed for allergies.    [provider]  metoprolol succinate (TOPROL-XL) 25 MG 24 hr tablet TAKE 1/2 TABLET BY MOUTH AT BEDTIME FOR palpitations 09/01/18   [name], MD  Multiple Vitamin (MULTIVITAMIN WITH MINERALS) TABS tablet Take 1 tablet by mouth daily.    [provider]  traZODone (DESYREL) 50 MG tablet Take 1 tablet (50 mg total) by mouth at bedtime as needed for sleep. 12/07/18   Arfeen, Arlyce Harman, MD    Family History Family History  Problem Relation Age of Onset   Hyperlipidemia Mother    Hypertension Mother    Depression Mother    Physical abuse Mother    Sexual abuse Mother        possibly   Sleep apnea Mother    Emphysema Paternal Grandmother    Heart disease Paternal Grandmother    Bipolar disorder Paternal Grandmother    Dementia Paternal Grandmother    Heart disease  Maternal Grandmother    Dementia Maternal Grandmother    Stroke Maternal Grandfather    Prostate cancer Maternal Grandfather    Personality disorder Father    Bipolar disorder Father    Alcohol abuse Father    ADD / ADHD Sister    Anxiety disorder Brother    ADD / ADHD Brother    ADD / ADHD Brother    Seizures Daughter    Drug abuse Neg Hx    OCD Neg Hx    Paranoid behavior Neg Hx    Schizophrenia Neg Hx     Social History Social History  Tobacco Use   Smoking status: Current Every Day Smoker    Packs/day: 0.50    Years: 10.00    Pack years: 5.00    Types: Cigarettes    Start date: 02/27/2002   Smokeless tobacco: Never Used   Tobacco comment: has cut back to less than 1/2 a pack and trying to quit  Substance Use Topics   Alcohol use: No    Alcohol/week: 0.0 standard drinks    Comment: recovering alcoholic 8 mos sober as of 04/15/13   Drug use: No    Comment: marijuana and pain pills; 12/08/07-smoked x 1 month ago- this was the first time in 2.years.  Morphine Sulfate and oxycontin - none since age 52     Allergies   Sulfonamide derivatives and Coconut flavor   Review of Systems Review of Systems  Constitutional: Negative for chills and fever.  Respiratory: Negative for cough, chest tightness and shortness of breath.   Cardiovascular: Negative for chest pain, palpitations and leg swelling.  Gastrointestinal: Positive for abdominal pain, diarrhea, nausea and vomiting.  Genitourinary: Positive for frequency and urgency. Negative for dysuria, flank pain, pelvic pain, vaginal bleeding, vaginal discharge and vaginal pain.  Musculoskeletal: Negative for arthralgias, myalgias, neck pain and neck stiffness.  Skin: Negative for rash.  Neurological: Negative for dizziness, weakness and headaches.  All other systems reviewed and are negative.    Physical Exam Updated Vital Signs BP 116/71 (BP Location: Left Arm)    Pulse 83    Temp 98.9 F (37.2 C)  (Oral)    Resp 18    SpO2 97%   Physical Exam Vitals signs and nursing note reviewed.  Constitutional:      General: She is not in acute distress.    Appearance: She is well-developed.  HENT:     Head: Normocephalic.  Eyes:     Conjunctiva/sclera: Conjunctivae normal.  Neck:     Musculoskeletal: Neck supple.  Cardiovascular:     Rate and Rhythm: Normal rate and regular rhythm.     Heart sounds: Normal heart sounds.  Pulmonary:     Effort: Pulmonary effort is normal. No respiratory distress.     Breath sounds: Normal breath sounds. No wheezing or rales.  Abdominal:     General: Bowel sounds are normal. There is no distension.     Palpations: Abdomen is soft.     Tenderness: There is abdominal tenderness in the right upper quadrant. There is no guarding or rebound. Negative signs include Murphy's sign.  Skin:    General: Skin is warm and dry.  Neurological:     Mental Status: She is alert.  Psychiatric:        Behavior: Behavior normal.      ED Treatments / Results  Labs (all labs ordered are listed, but only abnormal results are displayed) Labs Reviewed  COMPREHENSIVE METABOLIC PANEL - Abnormal; Notable for the following components:      Result Value   Glucose, Bld 129 (*)    All other components within normal limits  URINALYSIS, ROUTINE W REFLEX MICROSCOPIC - Abnormal; Notable for the following components:   APPearance CLOUDY (*)    All other components within normal limits  LIPASE, BLOOD  CBC  PREGNANCY, URINE  I-STAT BETA HCG BLOOD, ED ([facility], [facility], [facility] ONLY)    EKG None  Radiology US Abdomen Limited Ruq  Result Date: 12/26/2018 CLINICAL DATA:  Postprandial abdominal pain EXAM: ULTRASOUND ABDOMEN LIMITED RIGHT UPPER QUADRANT COMPARISON:  Ultrasound of October 27, 2011.  MRI of November 03, 2011. FINDINGS: Gallbladder: No gallstones or wall thickening visualized. No sonographic Murphy sign noted by sonographer. Common bile duct: Diameter: 2 mm which is within normal  limits. Liver: No focal lesion identified. The mass lesion noted on prior ultrasound is not visualized currently. Within normal limits in parenchymal echogenicity. Portal vein is patent on color Doppler imaging with normal direction of blood flow towards the liver. Other: None. IMPRESSION: No definite abnormality seen in the right upper quadrant of the abdomen. Electronically Signed   By: Marijo Conception M.D.   On: 12/26/2018 15:32    Procedures Procedures (including critical care time)  Medications Ordered in ED Medications  ondansetron (ZOFRAN) 8 mg in sodium chloride 0.9 % 50 mL IVPB (has no administration in time range)     Initial Impression / Assessment and Plan / ED Course  I have reviewed the triage vital signs and the nursing notes.  Pertinent labs & imaging results that were available during my care of the patient were reviewed by me and considered in my medical decision making (see chart for details).       Pt in ED with abdominal pain, specifically RUQ pain. Reports prior gallbladder issues. Last imaging is form 2013, normal [place], hida scan at that time. Will get labs and [place] RUQ today.   3:51 PM Labs normal. Not pregnant. [place] negative. Abdomen is soft. No peritoneal signs on exam. No fever. No vomiting in ED. Able to keep fluids down. No indication for further imaging at this time. Plan to dc home, start on bland diet, zofran for nausea, pepcid and carafate for stomach pain. Pt has a gastroenterologist, she state she will fallow up with them.     Final Clinical Impressions(s) / ED Diagnoses   Final diagnoses:  Abdominal pain     ED Discharge Orders         Ordered    famotidine (PEPCID) 20 MG tablet  2 times daily     12/26/18 1555    sucralfate (CARAFATE) 1 g tablet  3 times daily with meals & bedtime     12/26/18 1555           Jeannett Senior, PA-C 12/26/18 [place], Ankit, MD 12/28/18 2020

## 2018-12-26 NOTE — Discharge Instructions (Addendum)
Bland diet. Carafate and pepcid as prescribed. Follow up with pcp or gastroenterologist.

## 2018-12-26 NOTE — ED Triage Notes (Signed)
Pt reports RUQ pains with nausea, some diarrhea and urinary frequency for past several days.

## 2018-12-28 LAB — I-STAT BETA HCG BLOOD, ED (MC, WL, AP ONLY): I-stat hCG, quantitative: <5 m[IU]/mL (ref <5)

## 2018-12-29 NOTE — Progress Notes (Signed)
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 female with a history of ASD/VSD/Coarctation.  She is s/o repair  MRI of chest in Oct 2017 showed Aortic root was very mildy dilated.  Coarc repair site OK   I saw her back in 2019(March) Last echo done in March 2020:   LVEF/RVEF normal   Mild AI   Peak velocity down in descending aorta was 1.8 m/sec   Pt notes rare palpitations  Usually at night   Limited   Does not exercise   Does get SOB when pushes herself  Attrib to weight    She denies CP except when cat walks on her chest      Outpatient Medications Prior to Visit  Medication Sig Dispense Refill  . acetaminophen (TYLENOL) 500 MG tablet Take 1,000-2,000 mg by mouth every 6 (six) hours as needed for mild pain, moderate pain, fever or headache.    . albuterol (PROVENTIL HFA;VENTOLIN HFA) 108 (90 Base) MCG/ACT inhaler Inhale 1-2 puffs into the lungs every 6 (six) hours as needed for wheezing or shortness of breath.    . ARIPiprazole (ABILIFY) 15 MG tablet Take 1 tablet (15 mg total) by mouth daily. 30 tablet 2  . famotidine (PEPCID) 20 MG tablet Take 1 tablet (20 mg total) by mouth 2 (two) times daily. 30 tablet 0  . FLUoxetine (PROZAC) 40 MG capsule Take 1 capsule (40 mg total) by mouth daily. 30 capsule 2  . fluticasone (FLONASE) 50 MCG/ACT nasal spray USE 2 SPRAYS IN EACH NOSTRIL EVERY DAY (Patient taking differently: USE 2 SPRAYS IN EACH NOSTRIL EVERY DAY AS NEEDED FOR ALLERGIES) 16 g 5  . HYDROCORTISONE EX Apply 1 application topically as needed (ECZEMA).    . hydrOXYzine (ATARAX/VISTARIL) 25 MG tablet Take 1 tablet (25 mg total) by mouth 2 (two) times daily as needed for anxiety. 60 tablet 2  . lamoTRIgine (LAMICTAL) 200 MG tablet Take 1 tablet (200  mg total) by mouth at bedtime. 30 tablet 2  . lisdexamfetamine (VYVANSE) 40 MG capsule Take 1 capsule (40 mg total) by mouth daily. 30 capsule 0  . methylPREDNISolone acetate (DEPO-MEDROL) 20 MG/ML SUSP injection 20 mg once.    . metoprolol succinate (TOPROL-XL) 25 MG 24 hr tablet TAKE 1/2 TABLET BY MOUTH AT BEDTIME FOR palpitations (Patient taking differently: Take 12.5 mg by mouth daily. ) 15 tablet 4  . sucralfate (CARAFATE) 1 g tablet Take 1 tablet (1 g total) by mouth 4 (four) times daily -  with meals and at bedtime. 30 tablet 0  . traZODone (DESYREL) 50 MG tablet Take 1 tablet (50 mg total) by mouth at bedtime as needed for sleep. 30 tablet 0   No facility-administered medications prior to visit.      Allergies:   Sulfonamide derivatives, Coconut flavor, and Coconut oil   Past Medical History:  Diagnosis Date  . ADHD (attention deficit hyperactivity disorder)   . Anxiety   . Asthma    daily and prn inhalers  . Bipolar affective disorder ([place])   . Dental crowns present   . Depression   . Deviated nasal septum 05/2012  . Eczema    right leg  . Esophageal reflux   .  Fibromyalgia   . Hypertension   . IBS (irritable bowel syndrome)   . Migraine headache   . Nasal turbinate hypertrophy 05/2012   bilateral  . OCD (obsessive compulsive disorder)   . TMJ syndrome     Past Surgical History:  Procedure Laterality Date  . ASD AND VSD REPAIR  1989  . CARDIAC CATHETERIZATION  06/18/2006  . CESAREAN SECTION  04/12/2009  . COARCTATION OF AORTA REPAIR  1989  . COLONOSCOPY N/A 05/20/2013   Procedure: COLONOSCOPY;  Surgeon: Danie Binder, MD;  [facility];  Service: Endoscopy;  Laterality: N/A;  115-moved to [place] notified pt  . ESOPHAGOGASTRODUODENOSCOPY  04/04/2008     Normal esophagus/Mild patchy erythema in the antrum/ Normal duodenal bulb, normal small bowel biopsy  . FLEXIBLE SIGMOIDOSCOPY  04/04/2008     Small internal hemorrhoids (poor bowel prep)  .  HEMORRHOID BANDING N/A 05/20/2013   Procedure: HEMORRHOID BANDING;  Surgeon: Danie Binder, MD;  [facility];  Service: Endoscopy;  Laterality: N/A;  . NASAL SEPTOPLASTY W/ TURBINOPLASTY Bilateral 05/31/2012   Procedure: NASAL SEPTOPLASTY WITH TURBINATE REDUCTION;  Surgeon: Ascencion Dike, MD;  Location: [facility];  Service: ENT;  Laterality: Bilateral;     Social History:  The patient  reports that she has been smoking cigarettes. She started smoking about 16 years ago. She has a 5.00 pack-year smoking history. She has never used smokeless tobacco. She reports that she does not drink alcohol or use drugs.   Family History:  The patient's family history includes ADD / ADHD in her brother, brother, and sister; Alcohol abuse in her father; Anxiety disorder in her brother; Bipolar disorder in her father and paternal grandmother; Dementia in her maternal grandmother and paternal grandmother; Depression in her mother; Emphysema in her paternal grandmother; Heart disease in her maternal grandmother and paternal grandmother; Hyperlipidemia in her mother; Hypertension in her mother; Personality disorder in her father; Physical abuse in her mother; Prostate cancer in her maternal grandfather; Seizures in her daughter; Sexual abuse in her mother; Sleep apnea in her mother; Stroke in her maternal grandfather.    ROS:  Please see the history of present illness. All other systems are reviewed and  Negative to the above problem except as noted.    PHYSICAL EXAM: VS:  BP 114/76   Pulse 95   Ht 5\' 5"  (1.651 m)   Wt 227 lb 12.8 oz (103.3 kg)   BMI 37.91 kg/m   GEN: Well nourished, well developed, in no acute distress  HEENT: normal  Neck: JVP is normal  No carotid bruits, or masses Cardiac: RRR; no murmurs, rubs, or gallops,no edema  Respiratory:  clear to auscultation bilaterally, normal work of breathing GI: soft, nontender, nondistended, + BS  No hepatomegaly  MS: no deformity  Moving all extremities   Skin: warm and dry, no rash Neuro:  Strength and sensation are intact Psych: euthymic mood, full affect   EKG:  EKG is  ordered      SR 95 bpm    Echo:  3/20    1. The left ventricle has normal systolic function with an ejection fraction of 60-65%. The cavity size was normal. Left ventricular diastolic parameters were normal.  2. The right ventricle has normal systolic function. The cavity was normal. There is no increase in right ventricular wall thickness.  3. The mitral valve is normal in structure.  4. The tricuspid valve is normal in structure.  5. The aortic valve is tricuspid Aortic valve regurgitation is mild by color flow Doppler.  6. The pulmonic valve was normal in structure.  7. The ascending aorta and aortic root are normal in size and structure.  8. SAM of mitral chordae.  FINDINGS  Left Ventricle: The left ventricle has normal systolic function, with an ejection fraction of 60-65%. The cavity size was normal. There is no increase in left ventricular wall thickness. Left ventricular diastolic parameters were normal Right Ventricle: The right ventricle has normal systolic function. The cavity was normal. There is no increase in right ventricular wall thickness. Left Atrium: left atrial size was normal in size Right Atrium: right atrial size was normal in size. Right atrial pressure is estimated at 10 mmHg. Interatrial Septum: No atrial level shunt detected by color flow Doppler. Pericardium: There is no evidence of pericardial effusion. Mitral Valve: The mitral valve is normal in structure. Mitral valve regurgitation is not visualized by color flow Doppler. Tricuspid Valve: The tricuspid valve is normal in structure. Tricuspid valve regurgitation is mild by color flow Doppler. Aortic Valve: The aortic valve is tricuspid Aortic valve regurgitation is mild by color flow Doppler. Pulmonic Valve: The pulmonic valve was normal in structure. Pulmonic  valve regurgitation is not visualized by color flow Doppler. Aorta: The ascending aorta and aortic root are normal in size and structure. Venous: The inferior vena cava measures 0.80 cm, is normal in size with greater than 50% respiratory variability. Additional Comments: SAM of mitral chordae.   LEFT VENTRICLE PLAX 2D (Teich) LV EF:          61.2 %   Diastology LVIDd:          4.30 cm  LV e' lateral:   13.80 cm/s LVIDs:          2.90 cm  LV E/e' lateral: 6.6 LV PW:          1.30 cm  LV e' medial:    49.88 cm/s LV IVS:         1.30 cm  LV E/e' medial:  1.8 LVOT diam:      2.50 cm LV SV:          51 ml LVOT Area:      4.91 cm  RIGHT VENTRICLE RV S prime:     11.30 cm/s TAPSE (M-mode): 1.4 cm RVSP:           25.8 mmHg  LEFT ATRIUM             Index       RIGHT ATRIUM           Index LA diam:        3.10 cm 1.51 cm/m  RA Pressure: 10 mmHg LA Vol (A2C):   31.6 ml 15.43 ml/m RA Area:     16.10 cm LA Vol (A4C):   26.8 ml 13.09 ml/m RA Volume:   39.10 ml  19.09 ml/m LA Biplane Vol: 29.0 ml 14.16 ml/m  AORTIC VALVE LVOT Vmax:   95.80 cm/s LVOT Vmean:  61.200 cm/s LVOT VTI:    0.203 m   AORTA Ao Root diam: 4.00 cm Ao Asc diam:  3.50 cm  MITRAL VALVE              TRICUSPID VALVE MV Area (PHT):            TR Peak grad:   15.8 mmHg MV PHT:  TR Vmax:        207.00 cm/s MV Decel Time: 275 msec   RVSP:           25.8 mmHg MV E velocity: 91.30 cm/s MV A velocity: 72.70 cm/s MV E/A ratio:  1.26  IVC IVC diam: 0.80 cm    Dorris Carnes MD Electronically signed by Dorris Carnes MD Signature Date/Time: 06/04/2018/7:01:34 PM   MRI 2017  IMPRESSION: 1. Surgical changes of pre ductal aortic coarctation repair without evidence of complicating feature. 2. Mild dilatation of the aortic root which measures in the range of 3.9- 4.1 cm. 3. No effacement of the sino-tubular junction or significant dilatation of the tubular portion of the ascending aorta which  measures approximately 3.5 cm. 4. 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  Signed,  Criselda Peaches, MD   Lipid Panel    Component Value Date/Time   CHOL 181 06/04/2017 1510   TRIG 120 06/04/2017 1510   HDL 49 06/04/2017 1510   CHOLHDL 3.7 06/04/2017 1510   CHOLHDL 4.2 02/06/2007 0404   VLDL 14 02/06/2007 0404   LDLCALC 108 (H) 06/04/2017 1510      Wt Readings from Last 3 Encounters:  12/30/18 227 lb 12.8 oz (103.3 kg)  10/20/17 220 lb (99.8 kg)  06/05/17 214 lb (97.1 kg)      ASSESSMENT AND PLAN:  1  Congenital heart dz s/p ASD/ VSD/coarc repair   Doing well  Her SOB with exertion is more deconditioning I think   We have reviewed echo   LVEF and RVEF are normal   AV is prob bicuspid with very mild AI   Gradient through the coarc area is 1.8 m/sec Aorta is minimally increased at root    Wll follow up in July     2  Palpitations   Rare   Continue toprol  3  HCM  Check lipids today    Plan probably for aggressive Rx given previous surgeries    Discussed exercise and wt loss  4  EtOH abuse  She is now 1 year sober   Congratulated her. She continues with AA   F/U in July 2021      Current medicines are reviewed at length with the patient today.  The patient does not have concerns regarding medicines.  Signed, Dorris Carnes, MD  12/30/2018 9:56 AM    [facility] [place], [place], [place]  [address] Phone: [phone]; Fax: [phone]

## 2018-12-30 ENCOUNTER — Encounter: Payer: Self-pay | Admitting: Gastroenterology

## 2018-12-30 ENCOUNTER — Ambulatory Visit: Payer: Self-pay | Admitting: Gastroenterology

## 2018-12-30 ENCOUNTER — Ambulatory Visit (INDEPENDENT_AMBULATORY_CARE_PROVIDER_SITE_OTHER): Payer: Self-pay | Admitting: Internal Medicine

## 2018-12-30 ENCOUNTER — Ambulatory Visit: Payer: Medicaid Other | Admitting: Gastroenterology

## 2018-12-30 ENCOUNTER — Encounter: Payer: Self-pay | Admitting: Internal Medicine

## 2018-12-30 ENCOUNTER — Encounter

## 2018-12-30 ENCOUNTER — Other Ambulatory Visit: Payer: Self-pay

## 2018-12-30 VITALS — BP 114/76 | HR 95 | Ht 65.0 in | Wt 227.8 lb

## 2018-12-30 DIAGNOSIS — Q249 Congenital malformation of heart, unspecified: Secondary | ICD-10-CM

## 2018-12-30 DIAGNOSIS — Q251 Coarctation of aorta: Secondary | ICD-10-CM

## 2018-12-30 DIAGNOSIS — K219 Gastro-esophageal reflux disease without esophagitis: Secondary | ICD-10-CM

## 2018-12-30 LAB — LIPID PANEL
Chol/HDL Ratio: 3.9 ratio (ref 0.0–4.4)
Cholesterol, Total: 175 mg/dL (ref 100–199)
HDL: 45 mg/dL (ref >39)
LDL Chol Calc (NIH): 114 mg/dL — ABNORMAL HIGH (ref 0–99)
Triglycerides: 84 mg/dL (ref 0–149)
VLDL Cholesterol Cal: 16 mg/dL (ref 5–40)

## 2018-12-30 MED ORDER — OMEPRAZOLE 20 MG PO CPDR: DELAYED_RELEASE_CAPSULE | ORAL | 11 refills | Status: DC

## 2018-12-30 NOTE — Patient Instructions (Addendum)
MEDICATIONS FOR REFLUX:    1. START OMEPRAZOLE.  TAKE 30 MINUTES PRIOR TO YOUR MEALS TWICE DAILY.    2. USE PEPCID ONCE OR TICE DAILY FIVE DAYS A WEEK.    3. STOP CARAFATE.   EAT TO LIVE AND THINK OF FOOD AS MEDICINE. 75% OF YOUR PLATE SHOULD BE FRUITS/VEGGIES.  To have more energy, and to lose weight:      1. CONTINUE YOUR WEIGHT LOSS EFFORTS. I RECOMMEND YOU READ AND FOLLOW RECOMMENDATIONS BY DR. MARK HYMAN, "10-DAY DETOX DIET" AND "WHAT THE HECK SHOULD I EAT?".    2. If you must eat bread, EAT EZEKIEL BREAD. IT IS IN THE FROZEN SECTION OF THE GROCERY STORE.    3. DRINK WATER WITH FRUIT OR CUCUMBER ADDED. YOUR URINE LIGHT SHOULD BE LIGHT YELLOW. AVOID SODA, GATORADE, ENERGY DRINKS, OR DIET SODA.     4. AVOID HIGH FRUCTOSE CORN SYRUP.     5. DO NOT chew SUGAR FREE GUM OR USE ARTIFICIAL SWEETENERS. IF NEEDED USE STEVIA AS A SWEETENER.    6. DO NOT EAT ENRICHED WHEAT FLOUR, PASTA, RICE, OR CEREAL.    7. ONLY EAT WILD CAUGHT SEAFOOD, GRASS FED BEEF OR CHICKEN, PORK FROM PASTURE RAISE PIGS, OR EGGS FROM PASTURE RAISED CHICKENS.    8. PRACTICE CHAIR YOGA FOR 15-30 MINS 3 OR 4 TIMES A WEEK AND PROGRESS TO HATHA YOGA OVER NEXT 6 MOS.    9. START TAKING A MULTIVITAMIN, VITAMIN B12, AND VITAMIN D3 2000 IU DAILY.   ADDITIONAL SUPPLEMENTS TO DECREASE CRAVING AND SUPPRESS YOUR APPETITE:    1. CINNAMON 500 MG EVERY AM PRIOR TO FIRST MEAL.    2. CHROMIUM 400-500 MG WITH MEALS TWICE DAILY.    3. GREEN TEA EXTRACT ONE DAILY.   FOLLOW UP IN 4 MOS.   Please CALL or SEND me A MY CHART MESSAGE IF YOU HAVE QUESTIONS OR CONCERNS.

## 2018-12-30 NOTE — Progress Notes (Signed)
Subjective:    Patient ID: Kristin Stephens, female    DOB: 1986/03/04, 33 y.o.   MRN: [id number]  Mikey Kirschner, MD   HPI STARTED 3 WEEK SAGO. NAUSEA STARTED THEN pain in upper abdomen(DULL ACHY MOSTLY AND SOMETIMES SHARP, MAY MOVE TO RUQ). TUMS(6 AT ONE TIME) HELPS STOP BURPING. VOMITING: ONCE A DAY. DIARRHEA)#5-6 USUALLY THEN CAN BE #7, RARE #4. SEES BLOOD WHEN SHE WIPES(FOR PAST COUPLE WEEKS). BLACK STOOLS AFTER PEPTO. HEARTBURN: NO CHEST PAIN OR REFLUX, "SOUR STOMACH, BURPS AND FEELS SOURNESS COME UP). CAN'T DEXILANT ANYMORE BECAUSE IT'S TOO EXPENSIVE. WATERY STOOLS CONTROLS STOOL. HAS RECTAL URGENCY(FEELS LIKE NEEDS TO HAVE A BM ALL THE TIME). NO ASPIRIN, BC/GOODY POWDERS, IBUPROFEN/MOTRIN, OR NAPROXEN/ALEVE. SOBER ALMOST 1 YEAR. NO THC.   PT DENIES FEVER, CHILLS, HEMATEMESIS, vomiting, melena, CHEST PAIN, SHORTNESS OF BREATH,  CHANGE IN BOWEL IN HABITS, constipation, problems swallowing, problems with sedation, OR heartburn or indigestion.  Past Medical History:  Diagnosis Date  . ADHD (attention deficit hyperactivity disorder)   . Anxiety   . Asthma    daily and prn inhalers  . Bipolar affective disorder ([place])   . Dental crowns present   . Depression   . Deviated nasal septum 05/2012  . Eczema    right leg  . Esophageal reflux   . Fibromyalgia   . Hypertension   . IBS (irritable bowel syndrome)   . Migraine headache   . Nasal turbinate hypertrophy 05/2012   bilateral  . OCD (obsessive compulsive disorder)   . TMJ syndrome     Past Surgical History:  Procedure Laterality Date  . ASD AND VSD REPAIR  1989  . CARDIAC CATHETERIZATION  06/18/2006  . CESAREAN SECTION  04/12/2009  . COARCTATION OF AORTA REPAIR  1989  . COLONOSCOPY N/A 05/20/2013   Procedure: COLONOSCOPY;  Surgeon: Danie Binder, MD;  [facility];  Service: Endoscopy;  Laterality: N/A;  115-moved to [place] notified pt  . ESOPHAGOGASTRODUODENOSCOPY  04/04/2008     Normal  esophagus/Mild patchy erythema in the antrum/ Normal duodenal bulb, normal small bowel biopsy  . FLEXIBLE SIGMOIDOSCOPY  04/04/2008     Small internal hemorrhoids (poor bowel prep)  . HEMORRHOID BANDING N/A 05/20/2013   Procedure: HEMORRHOID BANDING;  Surgeon: Danie Binder, MD;  [facility];  Service: Endoscopy;  Laterality: N/A;  . NASAL SEPTOPLASTY W/ TURBINOPLASTY Bilateral 05/31/2012   Procedure: NASAL SEPTOPLASTY WITH TURBINATE REDUCTION;  Surgeon: Ascencion Dike, MD;  Location: [facility];  Service: ENT;  Laterality: Bilateral;   Allergies  Allergen Reactions  . Sulfonamide Derivatives Swelling    SWELLING OF EYES WITH OPHTHALMIC SULFA  . Coconut Flavor Rash  . Coconut Oil Rash   Current Outpatient Medications  Medication Sig    . acetaminophen (TYLENOL) 500 MG tablet Take 1,000-2,000 mg by mouth every 6 (six) hours as needed for mild pain, moderate pain, fever or headache.    . albuterol (PROVENTIL HFA;VENTOLIN HFA) 108 (90 Base) MCG/ACT inhaler Inhale 1-2 puffs into the lungs every 6 (six) hours as needed for wheezing or shortness of breath.    . ARIPiprazole (ABILIFY) 15 MG tablet Take 1 tablet (15 mg total) by mouth daily.    . famotidine (PEPCID) 20 MG tablet Take 1 tablet (20 mg total) by mouth 2 (two) times daily.    Marland Kitchen FLUoxetine (PROZAC) 40 MG capsule Take 1 capsule (40 mg total) by mouth daily.    . fluticasone (  FLONASE) 50 MCG/ACT nasal spray USE 2 SPRAYS IN EACH NOSTRIL EVERY DAY (Patient taking differently: USE 2 SPRAYS IN EACH NOSTRIL EVERY DAY AS NEEDED FOR ALLERGIES)    . HYDROCORTISONE EX Apply 1 application topically as needed (ECZEMA).    . hydrOXYzine (ATARAX/VISTARIL) 25 MG tablet Take 1 tablet (25 mg total) by mouth 2 (two) times daily as needed for anxiety.    . lamoTRIgine (LAMICTAL) 200 MG tablet Take 1 tablet (200 mg total) by mouth at bedtime.    Marland Kitchen lisdexamfetamine (VYVANSE) 40 MG capsule Take 1 capsule (40 mg total) by mouth daily.     . methylPREDNISolone acetate (DEPO-MEDROL) 20 MG/ML SUSP injection 20 mg once.    . metoprolol succinate (TOPROL-XL) 25 MG 24 hr tablet TAKE 1/2 TABLET BY MOUTH AT BEDTIME FOR palpitations (Patient taking differently: Take 12.5 mg by mouth daily. )    . sucralfate (CARAFATE) 1 g tablet Take 1 tablet (1 g total) by mouth 4 (four) times daily -  with meals and at bedtime.    . traZODone (DESYREL) 50 MG tablet Take 1 tablet (50 mg total) by mouth at bedtime as needed for sleep.     Review of Systems PER HPI OTHERWISE ALL SYSTEMS ARE NEGATIVE.    Objective:   Physical Exam Vitals signs reviewed.  Constitutional:      General: She is not in acute distress.    Appearance: She is well-developed.  HENT:     Head: Normocephalic and atraumatic.     Mouth/Throat:     Pharynx: No oropharyngeal exudate.  Eyes:     General: No scleral icterus.    Pupils: Pupils are equal, round, and reactive to light.  Neck:     Musculoskeletal: Normal range of motion and neck supple.  Cardiovascular:     Rate and Rhythm: Normal rate and regular rhythm.     Heart sounds: Normal heart sounds.  Pulmonary:     Effort: Pulmonary effort is normal. No respiratory distress.     Breath sounds: Normal breath sounds.  Abdominal:     General: Bowel sounds are normal. There is no distension.     Palpations: Abdomen is soft.     Tenderness: There is abdominal tenderness. There is no guarding or rebound.     Comments: MILD TTP IN THE EPIGASTRIUM & BUQS.  Musculoskeletal:     Right lower leg: No edema.     Left lower leg: No edema.  Lymphadenopathy:     Cervical: No cervical adenopathy.  Skin:    General: Skin is warm and dry.  Neurological:     Mental Status: She is alert and oriented to person, place, and time.     Comments: NO FOCAL DEFICITS  Psychiatric:        Behavior: Behavior normal.        Thought Content: Thought content normal.        Judgment: Judgment normal.     Comments: NORMAL AFFECT        Assessment & Plan:

## 2018-12-30 NOTE — Assessment & Plan Note (Signed)
SYMPTOMS NOT IDEALLY CONTROLLED OFF DEXILANT.  MEDICATIONS FOR REFLUX:    1. START OMEPRAZOLE.  TAKE 30 MINUTES PRIOR TO YOUR MEALS TWICE DAILY.   2. USE PEPCID ONCE OR TICE DAILY FIVE DAYS A WEEK.   3. STOP CARAFATE.   EAT TO LIVE AND THINK OF FOOD AS MEDICINE. 75% OF YOUR PLATE SHOULD BE FRUITS/VEGGIES.  To have more energy, and to lose weight:     1. CONTINUE YOUR WEIGHT LOSS EFFORTS. I RECOMMEND YOU READ AND FOLLOW RECOMMENDATIONS BY DR. MARK HYMAN, "10-DAY DETOX DIET" AND "WHAT THE HECK SHOULD I EAT?".   2. If you must eat bread, EAT EZEKIEL BREAD. IT IS IN THE FROZEN SECTION OF THE GROCERY STORE.   3. DRINK WATER WITH FRUIT OR CUCUMBER ADDED. YOUR URINE SHOULD BE LIGHT YELLOW. AVOID SODA, GATORADE, ENERGY DRINKS, OR DIET SODA.    4. AVOID HIGH FRUCTOSE CORN SYRUP.    5. DO NOT chew SUGAR FREE GUM OR USE ARTIFICIAL SWEETENERS. IF NEEDED USE STEVIA AS A SWEETENER.   6. DO NOT EAT ENRICHED WHEAT FLOUR, PASTA, RICE, OR CEREAL.   7. ONLY EAT WILD CAUGHT SEAFOOD, GRASS FED BEEF OR CHICKEN, PORK FROM PASTURE RAISE PIGS, OR EGGS FROM PASTURE RAISED CHICKENS.   8. PRACTICE CHAIR YOGA FOR 15-30 MINS 3 OR 4 TIMES A WEEK AND PROGRESS TO HATHA YOGA OVER NEXT 6 MOS.   9. START TAKING A MULTIVITAMIN, VITAMIN B12, AND VITAMIN D3 2000 IU DAILY.  ADDITIONAL SUPPLEMENTS TO DECREASE CRAVING AND SUPPRESS YOUR APPETITE:    1. CINNAMON 500 MG EVERY AM PRIOR TO FIRST MEAL.   2. CHROMIUM 400-500 MG WITH MEALS TWICE DAILY.   3. GREEN TEA EXTRACT ONE DAILY.  FOLLOW UP IN 4 MOS.   Please CALL or SEND me A MY CHART MESSAGE IF YOU HAVE QUESTIONS OR CONCERNS.

## 2018-12-30 NOTE — Patient Instructions (Signed)
Medication Instructions:  No changes If you need a refill on your cardiac medications before your next appointment, please call your pharmacy.   Lab work: Today: LIPID PANEL If you have labs (blood work) drawn today and your tests are completely normal, you will receive your results only by: Marland Kitchen MyChart Message (if you have MyChart) OR . A paper copy in the mail If you have any lab test that is abnormal or we need to change your treatment, we will call you to review the results.  Testing/Procedures: none  Follow-Up: At [facility], you and your health needs are our priority.  As part of our continuing mission to provide you with exceptional heart care, we have created designated Provider Care Teams.  These Care Teams include your primary Cardiologist (physician) and Advanced Practice Providers (APPs -  Physician Assistants and Nurse Practitioners) who all work together to provide you with the care you need, when you need it. You will need a follow up appointment in:  9 months.  Please call our office 2 months in advance to schedule this appointment.  You may see Dr. Dorris Carnes or one of the following Advanced Practice Providers on your designated Care Team: Richardson Dopp, PA-C [place], [place] . Daune Perch, NP  Any Other Special Instructions Will Be Listed Below (If Applicable).

## 2018-12-31 NOTE — Progress Notes (Signed)
ON RECALL  °

## 2018-12-31 NOTE — Progress Notes (Signed)
cc'ed to pcp °

## 2019-01-26 ENCOUNTER — Other Ambulatory Visit: Payer: Self-pay | Admitting: Internal Medicine

## 2019-01-26 ENCOUNTER — Telehealth (HOSPITAL_COMMUNITY): Payer: Self-pay

## 2019-01-26 DIAGNOSIS — F900 Attention-deficit hyperactivity disorder, predominantly inattentive type: Secondary | ICD-10-CM

## 2019-01-26 MED ORDER — LISDEXAMFETAMINE DIMESYLATE 40 MG PO CAPS: 40.0000 mg | ORAL_CAPSULE | Freq: Every day | ORAL | 0 refills | Status: DC

## 2019-01-26 NOTE — Telephone Encounter (Signed)
Patient called requesting a refill on her Vyvanse 40mg  to be sent to [facility] on [address] in [place]. Thank you.

## 2019-01-26 NOTE — Telephone Encounter (Signed)
done

## 2019-01-31 NOTE — Telephone Encounter (Signed)
Notified patient.

## 2019-02-22 ENCOUNTER — Telehealth (HOSPITAL_COMMUNITY): Payer: Self-pay | Admitting: *Deleted

## 2019-02-22 DIAGNOSIS — F900 Attention-deficit hyperactivity disorder, predominantly inattentive type: Secondary | ICD-10-CM

## 2019-02-22 MED ORDER — LISDEXAMFETAMINE DIMESYLATE 40 MG PO CAPS: 40.0000 mg | ORAL_CAPSULE | Freq: Every day | ORAL | 0 refills | Status: DC

## 2019-02-22 NOTE — Telephone Encounter (Signed)
done

## 2019-02-22 NOTE — Telephone Encounter (Signed)
Pt called requesting a refill on her Trazadone and VyVanse. She has an appointment on 03/07/19.

## 2019-02-27 ENCOUNTER — Emergency Department (HOSPITAL_COMMUNITY)
Admission: EM | Admit: 2019-02-27 | Discharge: 2019-02-28 | Disposition: A | Discharge status: Home / Self Care | Payer: Medicaid Other | Attending: Emergency Medicine | Admitting: Emergency Medicine

## 2019-02-27 ENCOUNTER — Other Ambulatory Visit: Payer: Self-pay

## 2019-02-27 ENCOUNTER — Encounter (HOSPITAL_COMMUNITY): Payer: Self-pay

## 2019-02-27 DIAGNOSIS — R519 Headache, unspecified: Secondary | ICD-10-CM | POA: Insufficient documentation

## 2019-02-27 DIAGNOSIS — I10 Essential (primary) hypertension: Secondary | ICD-10-CM | POA: Insufficient documentation

## 2019-02-27 DIAGNOSIS — Z79899 Other long term (current) drug therapy: Secondary | ICD-10-CM | POA: Insufficient documentation

## 2019-02-27 DIAGNOSIS — F17210 Nicotine dependence, cigarettes, uncomplicated: Secondary | ICD-10-CM | POA: Insufficient documentation

## 2019-02-27 DIAGNOSIS — J45909 Unspecified asthma, uncomplicated: Secondary | ICD-10-CM | POA: Insufficient documentation

## 2019-02-27 NOTE — ED Triage Notes (Signed)
Pt reports pain behind her L eye and on the L side of her forehead. She reports a hx of migraines. Reports that she has taken tylenol. Endorses nausea, denies vision changes. A&Ox4. Ambulatory.

## 2019-02-28 LAB — I-STAT BETA HCG BLOOD, ED (MC, WL, AP ONLY): I-stat hCG, quantitative: <5 m[IU]/mL (ref <5)

## 2019-02-28 MED ORDER — PROMETHAZINE HCL 25 MG PO TABS: 25.0000 mg | ORAL_TABLET | Freq: Four times a day (QID) | ORAL | 0 refills | Status: DC | PRN

## 2019-02-28 NOTE — ED Provider Notes (Signed)
[facility] DEPT Provider Note   CSN: [id number] Arrival date & time: 02/27/19  1933     History   Chief Complaint Chief Complaint  Patient presents with  . Headache    HPI Kristin Stephens is a 33 y.o. female with a history of ASD/VSD/Coartation of the Aorta, OCD, TMJ, bipolar disorder, anxiety, migraines, and asthma presents emergency department with a chief complaint of headache.  The patient reports that she developed a left sided headache behind her left eye that began when she awoke today.  Headache has been constant, been improving since onset.  No known aggravating or alleviating factors.  She reports associated nausea.  She took Tylenol at home prior to arrival and reports that her headache is now at a 2 out of 10 and she would like to go home.  She denies vomiting, fever, chills, neck pain or stiffness, diplopia, blurry vision, facial drooping, slurred speech, numbness or weakness, chest pain, dizziness, lightheadedness, back pain, or shortness of breath.  No URI symptoms, sinus pain or pressure.  Reports that she frequently gets headaches, she will typically get migraines 3 times per year.  No known triggers.  She has had diplopia with headaches in the past, but none today.     The history is provided by the patient. No language interpreter was used.    Past Medical History:  Diagnosis Date  . ADHD (attention deficit hyperactivity disorder)   . Anxiety   . Asthma    daily and prn inhalers  . Bipolar affective disorder ([place])   . Dental crowns present   . Depression   . Deviated nasal septum 05/2012  . Eczema    right leg  . Esophageal reflux   . Fibromyalgia   . Hypertension   . IBS (irritable bowel syndrome)   . Migraine headache   . Nasal turbinate hypertrophy 05/2012   bilateral  . OCD (obsessive compulsive disorder)   . TMJ syndrome     Patient Active Problem List   Diagnosis Date Noted  . Intentional overdose of drug in  tablet form ([place]) 01/26/2017  . Bipolar 1 disorder, depressed, severe ([place]) 01/26/2017  . Bipolar 2 disorder ([place]) 05/16/2014  . Internal hemorrhoids with complication 99991111  . Vasomotor rhinitis 04/15/2013  . Gastritis without bleeding 03/10/2013  . Anal fissure 03/10/2013  . Obsessive compulsive disorder 03/07/2013  . Fibromyalgia 02/06/2013  . Detachment of glenoid labrum 01/13/2013  . Difficulty in walking(719.7) 02/18/2012  . Seasonal affective disorder ([place]) 11/25/2011  . Asthma, intrinsic 08/28/2010  . ATTENTION DEFICIT DISORDER, ADULT 01/19/2008  . RHINITIS MEDICAMENTOSA 12/08/2007  . ALLERGIC RHINITIS 12/08/2007  . IBS 12/08/2007  . ECZEMA 12/08/2007  . 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 Migraine headache 04/24/2007  . Essential hypertension 04/24/2007  . ASVD 04/24/2007  . GERD 04/24/2007  . VSD 04/24/2007  . 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 ASD AND VSD REPAIR  1989  . CARDIAC CATHETERIZATION  06/18/2006  . CESAREAN SECTION  04/12/2009  . COARCTATION OF AORTA REPAIR  1989  . COLONOSCOPY N/A 05/20/2013   Procedure: COLONOSCOPY;  Surgeon: Danie Binder, MD;  Location[facility];  Service: Endoscopy;  Laterality: N/A;  115-moved to [place] notified pt  . ESOPHAGOGASTRODUODENOSCOPY  04/04/2008     Normal esophagus/Mild patchy erythema in the antrum/ Normal duodenal bulb, normal small bowel biopsy  . FLEXIBLE SIGMOIDOSCOPY  04/04/2008  Small internal hemorrhoids (poor bowel prep)  . HEMORRHOID BANDING N/A 05/20/2013   Procedure: HEMORRHOID BANDING;  Surgeon: Danie Binder, MD;  Location: [facility];  Service: Endoscopy;  Laterality: N/A;  . NASAL SEPTOPLASTY W/ TURBINOPLASTY Bilateral 05/31/2012   Procedure: NASAL SEPTOPLASTY WITH TURBINATE REDUCTION;  Surgeon: Ascencion Dike, MD;  Location: [facility];  Service: ENT;  Laterality: Bilateral;     OB History    Gravida  1    Para  1   Term  1   Preterm      AB      Living  1     SAB      TAB      Ectopic      Multiple      Live Births               Home Medications    Prior to Admission medications   Medication Sig Start Date End Date Taking? Authorizing Provider  acetaminophen (TYLENOL) 500 MG tablet Take 1,000-2,000 mg by mouth every 6 (six) hours as needed for mild pain, moderate pain, fever or headache.    [provider]  albuterol (PROVENTIL HFA;VENTOLIN HFA) 108 (90 Base) MCG/ACT inhaler Inhale 1-2 puffs into the lungs every 6 (six) hours as needed for wheezing or shortness of breath.    [provider]  ARIPiprazole (ABILIFY) 15 MG tablet Take 1 tablet (15 mg total) by mouth daily. 12/07/18   Arfeen, Arlyce Harman, MD  famotidine (PEPCID) 20 MG tablet Take 1 tablet (20 mg total) by mouth 2 (two) times daily. 12/26/18   Kirichenko, Tatyana, PA-C  FLUoxetine (PROZAC) 40 MG capsule Take 1 capsule (40 mg total) by mouth daily. 12/07/18   Arfeen, Arlyce Harman, MD  fluticasone (FLONASE) 50 MCG/ACT nasal spray USE 2 SPRAYS IN EACH NOSTRIL EVERY DAY Patient taking differently: USE 2 SPRAYS IN EACH NOSTRIL EVERY DAY AS NEEDED FOR ALLERGIES 09/21/17   Mikey Kirschner, MD  HYDROCORTISONE EX Apply 1 application topically as needed (ECZEMA).    [provider]  hydrOXYzine (ATARAX/VISTARIL) 25 MG tablet Take 1 tablet (25 mg total) by mouth 2 (two) times daily as needed for anxiety. 12/07/18   Arfeen, Arlyce Harman, MD  lamoTRIgine (LAMICTAL) 200 MG tablet Take 1 tablet (200 mg total) by mouth at bedtime. 12/07/18   Arfeen, Arlyce Harman, MD  lisdexamfetamine (VYVANSE) 40 MG capsule Take 1 capsule (40 mg total) by mouth daily. 02/22/19   Arfeen, Arlyce Harman, MD  methylPREDNISolone acetate (DEPO-MEDROL) 20 MG/ML SUSP injection 20 mg once.    [provider]  metoprolol succinate (TOPROL-XL) 25 MG 24 hr tablet TAKE 1/2 TABLET BY MOUTH AT BEDTIME FOR PALPITATIONS 01/27/19   [name], MD  omeprazole  (PRILOSEC) 20 MG capsule 1 PO 30 mins prior to breakfast and supper 12/30/18   Fields, Marga Melnick, MD  promethazine (PHENERGAN) 25 MG tablet Take 1 tablet (25 mg total) by mouth every 6 (six) hours as needed for nausea or vomiting. 02/28/19   Venezia Sargeant A, PA-C  sucralfate (CARAFATE) 1 g tablet Take 1 tablet (1 g total) by mouth 4 (four) times daily -  with meals and at bedtime. 12/26/18   Kirichenko, Lahoma Rocker, PA-C  traZODone (DESYREL) 50 MG tablet Take 1 tablet (50 mg total) by mouth at bedtime as needed for sleep. 12/07/18   Arfeen, Arlyce Harman, MD    Family History Family History  Problem Relation Age of Onset  .  Hyperlipidemia Mother   . Hypertension Mother   . Depression Mother   . Physical abuse Mother   . Sexual abuse Mother        possibly  . Sleep apnea Mother   . Emphysema Paternal Grandmother   . Heart disease Paternal Grandmother   . Bipolar disorder Paternal Grandmother   . Dementia Paternal Grandmother   . Heart disease Maternal Grandmother   . Dementia Maternal Grandmother   . Stroke Maternal Grandfather   . Prostate cancer Maternal Grandfather   . Personality disorder Father   . Bipolar disorder Father   . Alcohol abuse Father   . ADD / ADHD Sister   . Anxiety disorder Brother   . ADD / ADHD Brother   . ADD / ADHD Brother   . Seizures Daughter   . Drug abuse Neg Hx   . OCD Neg Hx   . Paranoid behavior Neg Hx   . Schizophrenia Neg Hx     Social History Social History   Tobacco Use  . Smoking status: Current Every Day Smoker    Packs/day: 0.50    Years: 10.00    Pack years: 5.00    Types: Cigarettes    Start date: 02/27/2002  . Smokeless tobacco: Never Used  . Tobacco comment: has cut back to less than 1/2 a pack and trying to quit  Substance Use Topics  . 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 Drug use: No    Comment: marijuana and pain pills; 12/08/07-smoked x 1 month ago- this was  the first time in 2.years.  Morphine Sulfate and oxycontin - none since age 52     Allergies   Sulfonamide derivatives, Coconut flavor, and Coconut oil   Review of Systems Review of Systems  Constitutional: Negative for activity change, chills and fever.  Respiratory: Negative for shortness of breath.   Cardiovascular: Negative for chest pain.  Gastrointestinal: Positive for nausea. Negative for abdominal pain, blood in stool, diarrhea and vomiting.  Genitourinary: Negative for dysuria.  Musculoskeletal: Negative for back pain.  Skin: Negative for rash.  Allergic/Immunologic: Negative for immunocompromised state.  Neurological: Positive for headaches. Negative for dizziness, seizures, syncope and weakness.  Psychiatric/Behavioral: Negative for confusion.     Physical Exam Updated Vital Signs BP (!) 88/53   Pulse (!) 59   Temp 98.7 F (37.1 C) (Oral)   Resp 19   Ht 5\' 4"  (1.626 m)   Wt 103 kg   SpO2 98%   BMI 38.96 kg/m   Physical Exam Vitals signs and nursing note reviewed.  Constitutional:      General: She is not in acute distress.    Appearance: She is not ill-appearing, toxic-appearing or diaphoretic.  HENT:     Head: Normocephalic.  Eyes:     Extraocular Movements: Extraocular movements intact.     Conjunctiva/sclera: Conjunctivae normal.     Pupils: Pupils are equal, round, and reactive to light.  Neck:     Musculoskeletal: Neck supple.  Cardiovascular:     Rate and Rhythm: Normal rate and regular rhythm.     Heart sounds: No murmur. No friction rub. No gallop.   Pulmonary:     Effort: Pulmonary effort is normal. No respiratory distress.     Breath sounds: No stridor. No wheezing, rhonchi or rales.  Chest:     Chest wall: No tenderness.  Abdominal:  General: There is no distension.     Palpations: Abdomen is soft.  Skin:    General: Skin is warm.     Findings: No rash.  Neurological:     Mental Status: She is alert.     Comments: Alert and  oriented x3.  Cranial nerves II through XII are grossly intact.  Gait is not ataxic.  5-5 strength against resistance of the bilateral upper and lower extremities.  Sensation is intact throughout.  Speaks in complete, fluent sentences.  Psychiatric:        Behavior: Behavior normal.    ED Treatments / Results  Labs (all labs ordered are listed, but only abnormal results are displayed) Labs Reviewed  I-STAT BETA HCG BLOOD, ED ([facility], [facility], [facility] ONLY)    EKG None  Radiology No results found.  Procedures Procedures (including critical care time)  Medications Ordered in ED Medications - No data to display   Initial Impression / Assessment and Plan / ED Course  I have reviewed the triage vital signs and the nursing notes.  Pertinent labs & imaging results that were available during my care of the patient were reviewed by me and considered in my medical decision making (see chart for details).        33 year old female with a history of ASD/VSD/Coartation of the Aorta, OCD, TMJ, bipolar disorder, anxiety, migraines, and asthma who presents to the emergency department with a chief complaint of headache, onset today accompanied by nausea.  She has had no vomiting, visual changes, numbness, or weakness.  No recent trauma.  Headaches are similar to previous migraines. She is hemodynamically stable.  She took Tylenol this afternoon and headache has improved while she has been waiting in the waiting room.  Headache is not currently 2 out of 10.  Pregnancy test is negative.  She is neurologically intact and has no red flags.  Doubt meningitis, CVA, SAH, or subdural.  She was offered a migraine cocktail in the ER, but declined because she was feeling much better.  She has been endorsing nausea and will be discharged with an Rx for antiemetics.  She was offered COVID-19 testing given nausea and headache, but the patient declined as she has had no high risk exposures.  She has good follow-up with  cardiology.  She is also been given a referral to a neurologist since she does typically get headaches approximately 3 times per year and has a rather complicated medical history given her history of ASD, VSD, and coarctation of the aorta.  She follows with cardiology regularly.  At this time, she is hemodynamically stable and in no acute distress.  ER return precautions given.  Safe for discharge home with outpatient follow-up at this time.  Final Clinical Impressions(s) / ED Diagnoses   Final diagnoses:  Bad headache    ED Discharge Orders         Ordered    promethazine (PHENERGAN) 25 MG tablet  Every 6 hours PRN     02/28/19 0108           Joanne Gavel, PA-C 02/28/19 0119    Palumbo, April, MD 02/28/19 0121

## 2019-02-28 NOTE — Discharge Instructions (Addendum)
Happy Birthday!!  Thank you for allowing me to care for you today in the Emergency Department.    You can take when the tablet of Phenergan every 6 hours as needed for nausea or vomiting.  Continue to take Tylenol as directed on the label for your headaches.  Return to the emergency department if you develop uncontrollable vomiting, headaches with double vision, slurred speech, facial drooping, new numbness or weakness, or other new, concerning symptoms.

## 2019-03-07 ENCOUNTER — Encounter (HOSPITAL_COMMUNITY): Payer: Self-pay | Admitting: Psychiatry

## 2019-03-07 ENCOUNTER — Other Ambulatory Visit: Payer: Self-pay

## 2019-03-10 ENCOUNTER — Encounter: Payer: Self-pay | Admitting: Gastroenterology

## 2019-04-04 ENCOUNTER — Telehealth (HOSPITAL_COMMUNITY): Payer: Self-pay | Admitting: *Deleted

## 2019-04-04 ENCOUNTER — Other Ambulatory Visit (HOSPITAL_COMMUNITY): Payer: Self-pay | Admitting: *Deleted

## 2019-04-04 DIAGNOSIS — F900 Attention-deficit hyperactivity disorder, predominantly inattentive type: Secondary | ICD-10-CM

## 2019-04-04 DIAGNOSIS — F411 Generalized anxiety disorder: Secondary | ICD-10-CM

## 2019-04-04 DIAGNOSIS — F3131 Bipolar disorder, current episode depressed, mild: Secondary | ICD-10-CM

## 2019-04-04 MED ORDER — TRAZODONE HCL 50 MG PO TABS: 50.0000 mg | ORAL_TABLET | Freq: Every evening | ORAL | 0 refills | Status: DC | PRN

## 2019-04-04 MED ORDER — ARIPIPRAZOLE 15 MG PO TABS: 15.0000 mg | ORAL_TABLET | Freq: Every day | ORAL | 2 refills | Status: DC

## 2019-04-04 MED ORDER — LAMOTRIGINE 200 MG PO TABS: 200.0000 mg | ORAL_TABLET | Freq: Every day | ORAL | 2 refills | Status: DC

## 2019-04-04 MED ORDER — FLUOXETINE HCL 40 MG PO CAPS: 40.0000 mg | ORAL_CAPSULE | Freq: Every day | ORAL | 2 refills | Status: DC

## 2019-04-04 MED ORDER — HYDROXYZINE HCL 25 MG PO TABS: 25.0000 mg | ORAL_TABLET | Freq: Two times a day (BID) | ORAL | 2 refills | Status: DC | PRN

## 2019-04-04 NOTE — Telephone Encounter (Signed)
Received request for refill of ADHD Medication. Pt requesting a refill of the VyVanse 40mg . All other medication refilled. Pt has an appointment on 04/13/19. Please review.

## 2019-04-05 MED ORDER — LISDEXAMFETAMINE DIMESYLATE 40 MG PO CAPS: 40.0000 mg | ORAL_CAPSULE | Freq: Every day | ORAL | 0 refills | Status: DC

## 2019-04-05 NOTE — Telephone Encounter (Signed)
done

## 2019-04-13 ENCOUNTER — Ambulatory Visit (INDEPENDENT_AMBULATORY_CARE_PROVIDER_SITE_OTHER): Payer: Medicaid Other | Admitting: Psychiatry

## 2019-04-13 ENCOUNTER — Encounter (HOSPITAL_COMMUNITY): Payer: Self-pay | Admitting: Psychiatry

## 2019-04-13 ENCOUNTER — Other Ambulatory Visit: Payer: Self-pay

## 2019-04-13 DIAGNOSIS — F3131 Bipolar disorder, current episode depressed, mild: Secondary | ICD-10-CM

## 2019-04-13 DIAGNOSIS — F900 Attention-deficit hyperactivity disorder, predominantly inattentive type: Secondary | ICD-10-CM

## 2019-04-13 DIAGNOSIS — F411 Generalized anxiety disorder: Secondary | ICD-10-CM

## 2019-04-13 MED ORDER — LAMOTRIGINE 200 MG PO TABS: 200.0000 mg | ORAL_TABLET | Freq: Every day | ORAL | 2 refills | Status: DC

## 2019-04-13 MED ORDER — FLUOXETINE HCL 40 MG PO CAPS: 40.0000 mg | ORAL_CAPSULE | Freq: Every day | ORAL | 2 refills | Status: DC

## 2019-04-13 MED ORDER — LISDEXAMFETAMINE DIMESYLATE 40 MG PO CAPS: 40.0000 mg | ORAL_CAPSULE | Freq: Every day | ORAL | 0 refills | Status: DC

## 2019-04-13 MED ORDER — ARIPIPRAZOLE 15 MG PO TABS: 15.0000 mg | ORAL_TABLET | Freq: Every day | ORAL | 2 refills | Status: DC

## 2019-04-13 MED ORDER — HYDROXYZINE HCL 25 MG PO TABS: 25.0000 mg | ORAL_TABLET | Freq: Two times a day (BID) | ORAL | 2 refills | Status: DC | PRN

## 2019-04-13 NOTE — Progress Notes (Signed)
Virtual Visit via Telephone Note  I connected with Kristin Stephens on 04/13/19 at 11:40 AM EST by telephone and verified that I am speaking with the correct person using two identifiers.   I discussed the limitations, risks, security and privacy concerns of performing an evaluation and management service by telephone and the availability of in person appointments. I also discussed with the patient that there may be a patient responsible charge related to this service. The patient expressed understanding and agreed to proceed.   History of Present Illness: Patient was evaluated by phone session.  She is taking all her medication except trazodone because she is sleeping much better.  She went to [place] to visit her 32 year old daughter on Christmas and she is going again tomorrow because her daughter's birthday.  She admitted Christmas was not as good because most of her family member did the Christmas on their own but she was happy to see her daughter.  Patient told things are going very well.  Her relationship with the boyfriend is good.  Currently she is not working due to cold weather but hoping to resume work once [employer] and better get under control.  She denies any highs and lows, irritability, anger, mania or any psychosis.  Her attention, concentration and multitasking is good.  She has no tremors or shakes.  She feels proud that she has been not drinking for more than a year and she pick up her chip on November 17 for 1 year sobriety.  She was also invited to talk at [employer] and she is very happy about it.  Her level is good.  Recently she was in the emergency room for headaches.  She is feeling much better.  She like to continue current medication except trazodone since she is sleeping better.   Past Psychiatric History:Reviewed. H/Omood swing, depression, anger,overdoseandslashing her wrist.Did IOP in 2016. Inpatientin October 2018.H/O ETOH.Took Adderall,  Ativan,Gabapentin,Wellbutrin but limited response.    Psychiatric Specialty Exam: Physical Exam  Review of Systems  There were no vitals taken for this visit.There is no height or weight on file to calculate BMI.  General Appearance: NA  Eye Contact:  NA  Speech:  Clear and Coherent and Normal Rate  Volume:  Normal  Mood:  Euthymic  Affect:  NA  Thought Process:  Goal Directed  Orientation:  Full (Time, Place, and Person)  Thought Content:  WDL and Logical  Suicidal Thoughts:  No  Homicidal Thoughts:  No  Memory:  Immediate;   Good Recent;   Good Remote;   Good  Judgement:  Good  Insight:  Good  Psychomotor Activity:  NA  Concentration:  Concentration: Good and Attention Span: Good  Recall:  Good  Fund of Knowledge:  Good  Language:  Good  Akathisia:  No  Handed:  Right  AIMS (if indicated):     Assets:  Communication Skills Desire for Improvement Housing Resilience Social Support  ADL's:  Intact  Cognition:  WNL  Sleep:   ok      Assessment and Plan: Bipolar disorder type I.  ADD, inattentive type.  Anxiety.   Patient is a stable on her current medication.  She is taking Abilify in the morning which is helping her all day.  She does not need trazodone since her sleep is improved.  She does not want to change medication.  Continue Lamictal 200 mg daily, hydroxyzine 25 mg twice a day, Abilify 15 mg in the morning and Vyvanse 40 mg in  the morning.  Discussed medication side effects and benefits.  Recommended to call us back if she has any question of any concern.  She is excited because she got invitation to speak at [employer].  We will schedule follow-up in 3 months.  We will do blood work on her next appointment.  Follow Up Instructions:    I discussed the assessment and treatment plan with the patient. The patient was provided an opportunity to ask questions and all were answered. The patient agreed with the plan and demonstrated an understanding of the  instructions.   The patient was advised to call back or seek an in-person evaluation if the symptoms worsen or if the condition fails to improve as anticipated.  I provided 20 minutes of non-face-to-face time during this encounter.   Kathlee Nations, MD

## 2019-05-02 ENCOUNTER — Encounter: Payer: Self-pay | Admitting: Family Medicine

## 2019-05-05 ENCOUNTER — Encounter (HOSPITAL_BASED_OUTPATIENT_CLINIC_OR_DEPARTMENT_OTHER): Payer: Self-pay | Admitting: *Deleted

## 2019-05-05 ENCOUNTER — Other Ambulatory Visit: Payer: Self-pay

## 2019-05-05 ENCOUNTER — Emergency Department (HOSPITAL_BASED_OUTPATIENT_CLINIC_OR_DEPARTMENT_OTHER)
Admission: EM | Admit: 2019-05-05 | Discharge: 2019-05-05 | Disposition: A | Discharge status: Home / Self Care | Payer: Medicaid Other | Attending: Emergency Medicine | Admitting: Emergency Medicine

## 2019-05-05 DIAGNOSIS — F17210 Nicotine dependence, cigarettes, uncomplicated: Secondary | ICD-10-CM | POA: Insufficient documentation

## 2019-05-05 DIAGNOSIS — J45909 Unspecified asthma, uncomplicated: Secondary | ICD-10-CM | POA: Insufficient documentation

## 2019-05-05 DIAGNOSIS — G43809 Other migraine, not intractable, without status migrainosus: Secondary | ICD-10-CM

## 2019-05-05 DIAGNOSIS — Z91018 Allergy to other foods: Secondary | ICD-10-CM | POA: Insufficient documentation

## 2019-05-05 DIAGNOSIS — Z9861 Coronary angioplasty status: Secondary | ICD-10-CM | POA: Insufficient documentation

## 2019-05-05 DIAGNOSIS — Z79899 Other long term (current) drug therapy: Secondary | ICD-10-CM | POA: Insufficient documentation

## 2019-05-05 DIAGNOSIS — Z882 Allergy status to sulfonamides status: Secondary | ICD-10-CM | POA: Insufficient documentation

## 2019-05-05 DIAGNOSIS — I10 Essential (primary) hypertension: Secondary | ICD-10-CM | POA: Insufficient documentation

## 2019-05-05 DIAGNOSIS — M797 Fibromyalgia: Secondary | ICD-10-CM | POA: Insufficient documentation

## 2019-05-05 MED ORDER — DIPHENHYDRAMINE HCL 50 MG/ML IJ SOLN
25.0000 mg | Freq: Once | INTRAMUSCULAR | Status: AC
  Administered 2019-05-05: 14:00:00 25 mg via INTRAVENOUS
  Filled 2019-05-05: qty 1

## 2019-05-05 MED ORDER — METOCLOPRAMIDE HCL 5 MG/ML IJ SOLN
10.0000 mg | Freq: Once | INTRAMUSCULAR | Status: AC
  Administered 2019-05-05: 14:00:00 10 mg via INTRAVENOUS
  Filled 2019-05-05: qty 2

## 2019-05-05 MED ORDER — KETOROLAC TROMETHAMINE 15 MG/ML IJ SOLN
15.0000 mg | Freq: Once | INTRAMUSCULAR | Status: AC
  Administered 2019-05-05: 14:00:00 15 mg via INTRAVENOUS
  Filled 2019-05-05: qty 1

## 2019-05-05 MED ORDER — SODIUM CHLORIDE 0.9 % IV BOLUS
500.0000 mL | Freq: Once | INTRAVENOUS | Status: AC
  Administered 2019-05-05: 14:00:00 500 mL via INTRAVENOUS

## 2019-05-05 NOTE — ED Triage Notes (Signed)
Headache with pain behind her right eye. Nausea. Hx of migraine headaches.

## 2019-05-05 NOTE — ED Notes (Signed)
ED Provider at bedside. 

## 2019-05-05 NOTE — ED Provider Notes (Signed)
[facility] EMERGENCY DEPARTMENT Provider Note   CSN: [id number] Arrival date & time: 05/05/19  1058     History Chief Complaint  Patient presents with  . Migraine    Kristin Stephens is a 34 y.o. female history of migraines presenting to emergency department with migraine headache.  Patient reports insidious onset of her pain yesterday afternoon.  She says it is not a typical pattern of her usual migraines.  She is describing severe throbbing pain behind her right eye.  She reports nausea.  She denies any fevers, chills, neck stiffness.  She took some Tylenol at home with some minimal improvement.  However she states when her headaches get this bad she usually needs to come to the ED for IV medications.  Since arriving in the ED she has taken a nap in the waiting room, and already is feeling somewhat better.  Not on migraine meds at home due to adverse reactions to imitrex  Allergies to sulfa drugs  HPI     Past Medical History:  Diagnosis Date  . ADHD (attention deficit hyperactivity disorder)   . Anxiety   . Asthma    daily and prn inhalers  . Bipolar affective disorder ([place])   . Dental crowns present   . Depression   . Deviated nasal septum 05/2012  . Eczema    right leg  . Esophageal reflux   . Fibromyalgia   . Hypertension   . IBS (irritable bowel syndrome)   . Migraine headache   . Nasal turbinate hypertrophy 05/2012   bilateral  . OCD (obsessive compulsive disorder)   . TMJ syndrome     Patient Active Problem List   Diagnosis Date Noted  . Intentional overdose of drug in tablet form ([place]) 01/26/2017  . Bipolar 1 disorder, depressed, severe ([place]) 01/26/2017  . Bipolar 2 disorder ([place]) 05/16/2014  . Internal hemorrhoids with complication 99991111  . Vasomotor rhinitis 04/15/2013  . Gastritis without bleeding 03/10/2013  . Anal fissure 03/10/2013  . Obsessive compulsive disorder 03/07/2013  . Fibromyalgia 02/06/2013  . Detachment of glenoid  labrum 01/13/2013  . Difficulty in walking(719.7) 02/18/2012  . Seasonal affective disorder ([place]) 11/25/2011  . Asthma, intrinsic 08/28/2010  . ATTENTION DEFICIT DISORDER, ADULT 01/19/2008  . RHINITIS MEDICAMENTOSA 12/08/2007  . ALLERGIC RHINITIS 12/08/2007  . IBS 12/08/2007  . ECZEMA 12/08/2007  . 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 Migraine headache 04/24/2007  . Essential hypertension 04/24/2007  . ASVD 04/24/2007  . GERD 04/24/2007  . VSD 04/24/2007  . 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 ASD AND VSD REPAIR  1989  . CARDIAC CATHETERIZATION  06/18/2006  . CESAREAN SECTION  04/12/2009  . COARCTATION OF AORTA REPAIR  1989  . COLONOSCOPY N/A 05/20/2013   Procedure: COLONOSCOPY;  Surgeon: Danie Binder, MD;  [facility];  Service: Endoscopy;  Laterality: N/A;  115-moved to [place] notified pt  . ESOPHAGOGASTRODUODENOSCOPY  04/04/2008     Normal esophagus/Mild patchy erythema in the antrum/ Normal duodenal bulb, normal small bowel biopsy  . FLEXIBLE SIGMOIDOSCOPY  04/04/2008     Small internal hemorrhoids (poor bowel prep)  . HEMORRHOID BANDING N/A 05/20/2013   Procedure: HEMORRHOID BANDING;  Surgeon: Danie Binder, MD;  Location[facility];  Service: Endoscopy;  Laterality: N/A;  . NASAL SEPTOPLASTY W/ TURBINOPLASTY Bilateral 05/31/2012   Procedure: NASAL SEPTOPLASTY WITH TURBINATE REDUCTION;  Surgeon: Ascencion Dike, MD;  Location: [facility];  Service: ENT;  Laterality: Bilateral;     OB History    Gravida  1   Para  1   Term  1   Preterm      AB      Living  1     SAB      TAB      Ectopic      Multiple      Live Births              Family History  Problem Relation Age of Onset  . Hyperlipidemia Mother   . Hypertension Mother   . Depression Mother   . Physical abuse Mother   . Sexual abuse Mother        possibly  . Sleep apnea Mother   . Emphysema Paternal  Grandmother   . Heart disease Paternal Grandmother   . Bipolar disorder Paternal Grandmother   . Dementia Paternal Grandmother   . Heart disease Maternal Grandmother   . Dementia Maternal Grandmother   . Stroke Maternal Grandfather   . Prostate cancer Maternal Grandfather   . Personality disorder Father   . Bipolar disorder Father   . Alcohol abuse Father   . ADD / ADHD Sister   . Anxiety disorder Brother   . ADD / ADHD Brother   . ADD / ADHD Brother   . Seizures Daughter   . Drug abuse Neg Hx   . OCD Neg Hx   . Paranoid behavior Neg Hx   . Schizophrenia Neg Hx     Social History   Tobacco Use  . Smoking status: Current Every Day Smoker    Packs/day: 0.50    Years: 10.00    Pack years: 5.00    Types: Cigarettes    Start date: 02/27/2002  . Smokeless tobacco: Never Used  . Tobacco comment: has cut back to less than 1/2 a pack and trying to quit  Substance Use Topics  . 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 Drug use: No    Comment: marijuana and pain pills; 12/08/07-smoked x 1 month ago- this was the first time in 2.years.  Morphine Sulfate and oxycontin - none since age 53    Home Medications Prior to Admission medications   Medication Sig Start Date End Date Taking? Authorizing Provider  acetaminophen (TYLENOL) 500 MG tablet Take 1,000-2,000 mg by mouth every 6 (six) hours as needed for mild pain, moderate pain, fever or headache.    [provider]  albuterol (PROVENTIL HFA;VENTOLIN HFA) 108 (90 Base) MCG/ACT inhaler Inhale 1-2 puffs into the lungs every 6 (six) hours as needed for wheezing or shortness of breath.    [provider]  ARIPiprazole (ABILIFY) 15 MG tablet Take 1 tablet (15 mg total) by mouth daily. 04/13/19   Arfeen, Arlyce Harman, MD  famotidine (PEPCID) 20 MG tablet Take 1 tablet (20 mg total) by mouth 2 (two) times daily. 12/26/18   Kirichenko, Tatyana, PA-C    FLUoxetine (PROZAC) 40 MG capsule Take 1 capsule (40 mg total) by mouth daily. 04/13/19   Arfeen, Arlyce Harman, MD  fluticasone (FLONASE) 50 MCG/ACT nasal spray USE 2 SPRAYS IN EACH NOSTRIL EVERY DAY Patient taking differently: USE 2 SPRAYS IN EACH NOSTRIL EVERY DAY AS NEEDED FOR ALLERGIES 09/21/17   Mikey Kirschner, MD  HYDROCORTISONE EX Apply 1 application topically as  needed (ECZEMA).    [provider]  hydrOXYzine (ATARAX/VISTARIL) 25 MG tablet Take 1 tablet (25 mg total) by mouth 2 (two) times daily as needed for anxiety. 04/13/19   Arfeen, Arlyce Harman, MD  lamoTRIgine (LAMICTAL) 200 MG tablet Take 1 tablet (200 mg total) by mouth at bedtime. 04/13/19   Arfeen, Arlyce Harman, MD  lisdexamfetamine (VYVANSE) 40 MG capsule Take 1 capsule (40 mg total) by mouth daily. 04/13/19   Arfeen, Arlyce Harman, MD  methylPREDNISolone acetate (DEPO-MEDROL) 20 MG/ML SUSP injection 20 mg once.    [provider]  metoprolol succinate (TOPROL-XL) 25 MG 24 hr tablet TAKE 1/2 TABLET BY MOUTH AT BEDTIME FOR PALPITATIONS 01/27/19   [name], MD  omeprazole (PRILOSEC) 20 MG capsule 1 PO 30 mins prior to breakfast and supper 12/30/18   Fields, Marga Melnick, MD  promethazine (PHENERGAN) 25 MG tablet Take 1 tablet (25 mg total) by mouth every 6 (six) hours as needed for nausea or vomiting. 02/28/19   McDonald, Mia A, PA-C  sucralfate (CARAFATE) 1 g tablet Take 1 tablet (1 g total) by mouth 4 (four) times daily -  with meals and at bedtime. 12/26/18   Kirichenko, Lahoma Rocker, PA-C  traZODone (DESYREL) 50 MG tablet Take 1 tablet (50 mg total) by mouth at bedtime as needed for sleep. Patient not taking: Reported on 04/13/2019 04/04/19   Kathlee Nations, MD    Allergies    Sulfonamide derivatives, Coconut flavor, and Coconut oil  Review of Systems   Review of Systems  Constitutional: Negative for chills and fever.  HENT: Negative for ear pain and sore throat.   Cardiovascular: Negative for chest pain and palpitations.   Gastrointestinal: Positive for nausea. Negative for abdominal pain.  Musculoskeletal: Negative for neck pain and neck stiffness.  Skin: Negative for pallor and rash.  Neurological: Positive for light-headedness and headaches. Negative for syncope, facial asymmetry, speech difficulty and weakness.  Psychiatric/Behavioral: Negative for agitation and confusion.  All other systems reviewed and are negative.   Physical Exam Updated Vital Signs BP 113/71 (BP Location: Right Arm)   Pulse 80   Temp 98.6 F (37 C) (Oral)   Resp 14   Ht 5\' 5"  (1.651 m)   Wt 97.5 kg   SpO2 98%   BMI 35.78 kg/m   Physical Exam Vitals and nursing note reviewed.  Constitutional:      General: She is not in acute distress.    Appearance: She is well-developed.  HENT:     Head: Normocephalic and atraumatic.  Eyes:     Extraocular Movements: Extraocular movements intact.     Conjunctiva/sclera: Conjunctivae normal.     Pupils: Pupils are equal, round, and reactive to light.     Comments: No photophobia  Cardiovascular:     Rate and Rhythm: Normal rate and regular rhythm.     Pulses: Normal pulses.  Pulmonary:     Effort: Pulmonary effort is normal. No respiratory distress.  Musculoskeletal:     Cervical back: Normal range of motion and neck supple. No rigidity.  Skin:    General: Skin is warm and dry.  Neurological:     Mental Status: She is alert.     GCS: GCS eye subscore is 4. GCS verbal subscore is 5. GCS motor subscore is 6.     Cranial Nerves: Cranial nerves are intact. No cranial nerve deficit, dysarthria or facial asymmetry.     Sensory: Sensation is intact.     Motor: Motor  function is intact.     Gait: Gait is intact.  Psychiatric:        Mood and Affect: Mood normal.        Behavior: Behavior normal.     ED Results / Procedures / Treatments   Labs (all labs ordered are listed, but only abnormal results are displayed) Labs Reviewed - No data to  display  EKG None  Radiology No results found.  Procedures Procedures (including critical care time)  Medications Ordered in ED Medications  ketorolac (TORADOL) 15 MG/ML injection 15 mg (15 mg Intravenous Given 05/05/19 1404)  metoCLOPramide (REGLAN) injection 10 mg (10 mg Intravenous Given 05/05/19 1405)  diphenhydrAMINE (BENADRYL) injection 25 mg (25 mg Intravenous Given 05/05/19 1402)  sodium chloride 0.9 % bolus 500 mL (0 mLs Intravenous Stopped 05/05/19 1509)    ED Course  I have reviewed the triage vital signs and the nursing notes.  Pertinent labs & imaging results that were available during my care of the patient were reviewed by me and considered in my medical decision making (see chart for details).  34 yo female w/ hx of migraines presenting to ED with complaint of migraine headache in its typical pattern, right frontal located behind her right eye, with nausea.  No fevers, chills, neck stiffness, or signs or symptoms of meningitis.  She has a benign neurological and physical exam.  I do not suspect ICH or stroke or vascular injury.  Will treat with IV migraine cocktail, and discharge if improved.  Clinical Course as of May 05 1819  Thu May 05, 2019  1545 Feeling significantly better, wants to go home, will discharge   [MT]    Clinical Course User Index [MT] Cecillia Menees, Carola Rhine, MD   Final Clinical Impression(s) / ED Diagnoses Final diagnoses:  Other migraine without status migrainosus, not intractable    Rx / DC Orders ED Discharge Orders    None       Wyvonnia Dusky, MD 05/05/19 1821

## 2019-05-20 ENCOUNTER — Ambulatory Visit (HOSPITAL_COMMUNITY)
Admission: EM | Admit: 2019-05-20 | Discharge: 2019-05-20 | Disposition: A | Discharge status: Home / Self Care | Payer: Self-pay | Attending: Family Medicine | Admitting: Family Medicine

## 2019-05-20 ENCOUNTER — Other Ambulatory Visit: Payer: Self-pay

## 2019-05-20 ENCOUNTER — Encounter (HOSPITAL_COMMUNITY): Payer: Self-pay

## 2019-05-20 ENCOUNTER — Ambulatory Visit (INDEPENDENT_AMBULATORY_CARE_PROVIDER_SITE_OTHER): Payer: Self-pay

## 2019-05-20 DIAGNOSIS — M25462 Effusion, left knee: Secondary | ICD-10-CM

## 2019-05-20 DIAGNOSIS — M25562 Pain in left knee: Secondary | ICD-10-CM

## 2019-05-20 MED ORDER — METHYLPREDNISOLONE 4 MG PO TBPK: ORAL_TABLET | ORAL | 0 refills | Status: DC

## 2019-05-20 NOTE — ED Triage Notes (Signed)
Patient presents to Urgent Care with complaints of left knee pain since getting out of bed this morning. Patient reports she has chronic problems w/ her knee but over the past two days it has been worse, has been using tylenol and ibuprofen and ice intermittently.

## 2019-05-20 NOTE — ED Notes (Signed)
Patient refused knee sleeve due to cost. Patient provided w/ alternative purchasing options and agreed to purchase a similar product that she can afford.

## 2019-05-20 NOTE — ED Provider Notes (Signed)
[facility]    CSN: [id number] Arrival date & time: 05/20/19  0820      History   Chief Complaint Chief Complaint  Patient presents with  . Appointment    0830  . Knee Pain    Kristin DASHONA Stephens is a 34 y.o. female.   Kristin Patient states she has had knee pain on the left knee off and on for some time.  Many months.  Denies old trauma.  Denies old injury.  Denies family history of knee problems.  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  She thinks this is because wear and tear in her knee.  Because of this job she periodically has knee pain when she gets off work. This morning, she was sleeping in a fetal position.  When she went to straighten out her leg she felt a pop and severe pain in the knee.  Ever since then she states the knee is painful with ambulation.  She is limping.  This is worse than her knee usually hurts.  She has not taken any medication for the knee today, although when it hurts and usually she will take ibuprofen or Tylenol with some relief. She states that when she puts her weight on the knee feels like it is going to give out. She has not had any symptoms of locking.  She has not fallen Past Medical History:  Diagnosis Date  . ADHD (attention deficit hyperactivity disorder)   . Anxiety   . Asthma    daily and prn inhalers  . Bipolar affective disorder ([place])   . Dental crowns present   . Depression   . Deviated nasal septum 05/2012  . Eczema    right leg  . Esophageal reflux   . Fibromyalgia   . Hypertension   . IBS (irritable bowel syndrome)   . Migraine headache   . Nasal turbinate hypertrophy 05/2012   bilateral  . OCD (obsessive compulsive disorder)   . TMJ syndrome     Patient Active Problem List   Diagnosis Date Noted  . Intentional overdose of drug in tablet form ([place]) 01/26/2017  . Bipolar 1 disorder, depressed, severe ([place]) 01/26/2017  . Bipolar 2 disorder ([place]) 05/16/2014  . Internal  hemorrhoids with complication 99991111  . Vasomotor rhinitis 04/15/2013  . Gastritis without bleeding 03/10/2013  . Anal fissure 03/10/2013  . Obsessive compulsive disorder 03/07/2013  . Fibromyalgia 02/06/2013  . Detachment of glenoid labrum 01/13/2013  . Difficulty in walking(719.7) 02/18/2012  . Seasonal affective disorder ([place]) 11/25/2011  . Asthma, intrinsic 08/28/2010  . ATTENTION DEFICIT DISORDER, ADULT 01/19/2008  . RHINITIS MEDICAMENTOSA 12/08/2007  . ALLERGIC RHINITIS 12/08/2007  . IBS 12/08/2007  . ECZEMA 12/08/2007  . 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 Migraine headache 04/24/2007  . Essential hypertension 04/24/2007  . ASVD 04/24/2007  . GERD 04/24/2007  . VSD 04/24/2007  . 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 ASD AND VSD REPAIR  1989  . CARDIAC CATHETERIZATION  06/18/2006  . CESAREAN SECTION  04/12/2009  . COARCTATION OF AORTA REPAIR  1989  . COLONOSCOPY N/A 05/20/2013   Procedure: COLONOSCOPY;  Surgeon: Danie Binder, MD;  [facility];  Service: Endoscopy;  Laterality: N/A;  115-moved to [place] notified pt  . ESOPHAGOGASTRODUODENOSCOPY  04/04/2008     Normal esophagus/Mild patchy erythema in the antrum/ Normal duodenal bulb,  normal small bowel biopsy  . FLEXIBLE SIGMOIDOSCOPY  04/04/2008     Small internal hemorrhoids (poor bowel prep)  . HEMORRHOID BANDING N/A 05/20/2013   Procedure: HEMORRHOID BANDING;  Surgeon: Danie Binder, MD;  [facility];  Service: Endoscopy;  Laterality: N/A;  . NASAL SEPTOPLASTY W/ TURBINOPLASTY Bilateral 05/31/2012   Procedure: NASAL SEPTOPLASTY WITH TURBINATE REDUCTION;  Surgeon: Ascencion Dike, MD;  Location: [facility];  Service: ENT;  Laterality: Bilateral;    OB History    Gravida  1   Para  1   Term  1   Preterm      AB      Living  1     SAB      TAB      Ectopic      Multiple      Live Births                Home Medications    Prior to Admission medications   Medication Sig Start Date End Date Taking? Authorizing Provider  acetaminophen (TYLENOL) 500 MG tablet Take 1,000-2,000 mg by mouth every 6 (six) hours as needed for mild pain, moderate pain, fever or headache.    [provider]  albuterol (PROVENTIL HFA;VENTOLIN HFA) 108 (90 Base) MCG/ACT inhaler Inhale 1-2 puffs into the lungs every 6 (six) hours as needed for wheezing or shortness of breath.    [provider]  ARIPiprazole (ABILIFY) 15 MG tablet Take 1 tablet (15 mg total) by mouth daily. 04/13/19   Arfeen, Arlyce Harman, MD  famotidine (PEPCID) 20 MG tablet Take 1 tablet (20 mg total) by mouth 2 (two) times daily. 12/26/18   Kirichenko, Tatyana, PA-C  FLUoxetine (PROZAC) 40 MG capsule Take 1 capsule (40 mg total) by mouth daily. 04/13/19   Arfeen, Arlyce Harman, MD  fluticasone (FLONASE) 50 MCG/ACT nasal spray USE 2 SPRAYS IN EACH NOSTRIL EVERY DAY Patient taking differently: USE 2 SPRAYS IN EACH NOSTRIL EVERY DAY AS NEEDED FOR ALLERGIES 09/21/17   Mikey Kirschner, MD  HYDROCORTISONE EX Apply 1 application topically as needed (ECZEMA).    [provider]  hydrOXYzine (ATARAX/VISTARIL) 25 MG tablet Take 1 tablet (25 mg total) by mouth 2 (two) times daily as needed for anxiety. 04/13/19   Arfeen, Arlyce Harman, MD  lamoTRIgine (LAMICTAL) 200 MG tablet Take 1 tablet (200 mg total) by mouth at bedtime. 04/13/19   Arfeen, Arlyce Harman, MD  lisdexamfetamine (VYVANSE) 40 MG capsule Take 1 capsule (40 mg total) by mouth daily. 04/13/19   Kathlee Nations, MD  methylPREDNISolone (MEDROL DOSEPAK) 4 MG TBPK tablet tad 05/20/19   Raylene Everts, MD  metoprolol succinate (TOPROL-XL) 25 MG 24 hr tablet TAKE 1/2 TABLET BY MOUTH AT BEDTIME FOR PALPITATIONS 01/27/19   [name], MD  omeprazole (PRILOSEC) 20 MG capsule 1 PO 30 mins prior to breakfast and supper 12/30/18   Fields, Marga Melnick, MD  sucralfate (CARAFATE) 1 g tablet Take 1 tablet (1 g total)  by mouth 4 (four) times daily -  with meals and at bedtime. 12/26/18   Kirichenko, Lahoma Rocker, PA-C  traZODone (DESYREL) 50 MG tablet Take 1 tablet (50 mg total) by mouth at bedtime as needed for sleep. Patient not taking: Reported on 04/13/2019 04/04/19   Arfeen, Arlyce Harman, MD  promethazine (PHENERGAN) 25 MG tablet Take 1 tablet (25 mg total) by mouth every 6 (six) hours as needed for nausea or vomiting. 02/28/19 05/20/19  McDonald, Mia A, PA-C    Family History Family History  Problem Relation Age of Onset  . Hyperlipidemia Mother   . Hypertension Mother   . Depression Mother   . Physical abuse Mother   . Sexual abuse Mother        possibly  . Sleep apnea Mother   . Emphysema Paternal Grandmother   . Heart disease Paternal Grandmother   . Bipolar disorder Paternal Grandmother   . Dementia Paternal Grandmother   . Heart disease Maternal Grandmother   . Dementia Maternal Grandmother   . Stroke Maternal Grandfather   . Prostate cancer Maternal Grandfather   . Personality disorder Father   . Bipolar disorder Father   . Alcohol abuse Father   . ADD / ADHD Sister   . Anxiety disorder Brother   . ADD / ADHD Brother   . ADD / ADHD Brother   . Seizures Daughter   . Drug abuse Neg Hx   . OCD Neg Hx   . Paranoid behavior Neg Hx   . Schizophrenia Neg Hx     Social History Social History   Tobacco Use  . Smoking status: Current Every Day Smoker    Packs/day: 0.50    Years: 10.00    Pack years: 5.00    Types: Cigarettes    Start date: 02/27/2002  . Smokeless tobacco: Never Used  . Tobacco comment: has cut back to less than 1/2 a pack and trying to quit  Substance Use Topics  . 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 Drug use: No    Comment: marijuana and pain pills; 12/08/07-smoked x 1 month ago- this was the first time in 2.years.  Morphine Sulfate and oxycontin - none since age 77     Allergies    Sulfonamide derivatives, Coconut flavor, and Coconut oil   Review of Systems Review of Systems  Musculoskeletal: Positive for arthralgias and gait problem.     Physical Exam Triage Vital Signs ED Triage Vitals  Enc Vitals Group     BP 05/20/19 0837 125/82     Pulse Rate 05/20/19 0837 (!) 103     Resp 05/20/19 0837 16     Temp 05/20/19 0837 98.7 F (37.1 C)     Temp Source 05/20/19 0837 Oral     SpO2 05/20/19 0837 97 %     Weight 05/20/19 0837 215 lb (97.5 kg)     Height --      Head Circumference --      Peak Flow --      Pain Score 05/20/19 0841 7     Pain Loc --      Pain Edu? --      Excl. in [place]? --    No data found.  Updated Vital Signs BP 125/82 (BP Location: Right Arm)   Pulse (!) 103   Temp 98.7 F (37.1 C) (Oral)   Resp 16   Wt 97.5 kg   SpO2 97%   BMI 35.78 kg/m   Visual Acuity Right Eye Distance:   Left Eye Distance:   Bilateral Distance:    Right Eye Near:   Left Eye Near:    Bilateral Near:     Physical Exam Constitutional:      General: She is not in acute distress.    Appearance: Normal appearance. She is well-developed.     Comments: Overweight.  Pleasant and cooperative  HENT:     Head: Normocephalic and atraumatic.     Mouth/Throat:     Comments: Mask is in place Eyes:     Conjunctiva/sclera: Conjunctivae normal.     Pupils: Pupils are equal, round, and reactive to light.  Cardiovascular:     Rate and Rhythm: Normal rate and regular rhythm.  Pulmonary:     Effort: Pulmonary effort is normal. No respiratory distress.  Abdominal:     General: There is distension.  Musculoskeletal:        General: Normal range of motion.     Cervical back: Normal range of motion.     Comments: Knees are examined.  Right knee is normal.  Left knee has effusion.  Warmth.  Tenderness or around the patella especially laterally.  Mild tenderness with patellofemoral grind testing.  Full range of motion.  No instability.  No tenderness or joint lines.   Skin:    General: Skin is warm and dry.  Neurological:     General: No focal deficit present.     Mental Status: She is alert.     Gait: Gait abnormal.  Psychiatric:        Mood and Affect: Mood normal.        Behavior: Behavior normal.      UC Treatments / Results  Labs (all labs ordered are listed, but only abnormal results are displayed) Labs Reviewed - No data to display  EKG   Radiology DG Knee AP/LAT W/[place] Left  Result Date: 05/20/2019 CLINICAL DATA:  Left knee pain.  Knee effusion. EXAM: LEFT KNEE 3 VIEWS COMPARISON:  None. FINDINGS: Normal alignment of left knee. Negative for fracture or dislocation or large joint effusion. No significant degenerative changes. IMPRESSION: Normal radiographs of left knee. Electronically Signed   By: Markus Daft M.D.   On: 05/20/2019 09:16    Procedures Procedures (including critical care time)  Medications Ordered in UC Medications - No data to display  Initial Impression / Assessment and Plan / UC Course  I have reviewed the triage vital signs and the nursing notes.  Pertinent labs & imaging results that were available during my care of the patient were reviewed by me and considered in my medical decision making (see chart for details).     Reviewed her knee pain.  Likely some internal derangement with effusion and warmth.  This could just be some of cartilage from years of repetitive squatting.  Possibly cartilage tear.  We will treat conservatively.  She understands that if she continues to have pain she may need to see an orthopedic Final Clinical Impressions(s) / UC Diagnoses   Final diagnoses:  Acute pain of left knee  Knee effusion, left     Discharge Instructions     Take the medrol pak as directed This is a steroid anti inflammatory Take all of day one today Use ice 20 minutes every 2-4 hours Activity as tolerated After the steroid use aleve 2 tabs every 12 hours as needed    ED Prescriptions     Medication Sig Dispense Auth. Provider   methylPREDNISolone (MEDROL DOSEPAK) 4 MG TBPK tablet tad 21 tablet Raylene Everts, MD     PDMP not reviewed this encounter.   Raylene Everts, MD 05/20/19 [phone]

## 2019-05-20 NOTE — Discharge Instructions (Addendum)
Take the medrol pak as directed This is a steroid anti inflammatory Take all of day one today Use ice 20 minutes every 2-4 hours Activity as tolerated After the steroid use aleve 2 tabs every 12 hours as needed

## 2019-05-25 ENCOUNTER — Encounter (HOSPITAL_COMMUNITY): Payer: Self-pay

## 2019-05-25 ENCOUNTER — Ambulatory Visit (HOSPITAL_COMMUNITY)
Admission: EM | Admit: 2019-05-25 | Discharge: 2019-05-25 | Disposition: A | Discharge status: Home / Self Care | Payer: Medicaid Other | Attending: Family Medicine | Admitting: Family Medicine

## 2019-05-25 ENCOUNTER — Other Ambulatory Visit: Payer: Self-pay

## 2019-05-25 DIAGNOSIS — M222X2 Patellofemoral disorders, left knee: Secondary | ICD-10-CM

## 2019-05-25 NOTE — ED Triage Notes (Signed)
Pt presents for follow up concerning continued knee pain after 02/19 visit; pt states she was given steriod injection but has had ongoing knee pain that is unrelieved with OTC medication.

## 2019-05-25 NOTE — ED Provider Notes (Signed)
[facility]    CSN: [id number] Arrival date & time: 05/25/19  [place]      History   Chief Complaint Chief Complaint  Patient presents with  . Appointment  . (7 pm Follow Up - Knee Pain)    HPI Kristin Stephens is a 34 y.o. female.   She is presenting with left knee pain.  She has been taking prednisone with limited improvement.  The pain is anterior nature.  It seems to be worse when she gets up from a seated position.  Denies any mechanical symptoms.  No redness or swelling.  No inciting event or trauma  HPI  Past Medical History:  Diagnosis Date  . ADHD (attention deficit hyperactivity disorder)   . Anxiety   . Asthma    daily and prn inhalers  . Bipolar affective disorder ([place])   . Dental crowns present   . Depression   . Deviated nasal septum 05/2012  . Eczema    right leg  . Esophageal reflux   . Fibromyalgia   . Hypertension   . IBS (irritable bowel syndrome)   . Migraine headache   . Nasal turbinate hypertrophy 05/2012   bilateral  . OCD (obsessive compulsive disorder)   . TMJ syndrome     Patient Active Problem List   Diagnosis Date Noted  . Intentional overdose of drug in tablet form ([place]) 01/26/2017  . Bipolar 1 disorder, depressed, severe ([place]) 01/26/2017  . Bipolar 2 disorder ([place]) 05/16/2014  . Internal hemorrhoids with complication 99991111  . Vasomotor rhinitis 04/15/2013  . Gastritis without bleeding 03/10/2013  . Anal fissure 03/10/2013  . Obsessive compulsive disorder 03/07/2013  . Fibromyalgia 02/06/2013  . Detachment of glenoid labrum 01/13/2013  . Difficulty in walking(719.7) 02/18/2012  . Seasonal affective disorder ([place]) 11/25/2011  . Asthma, intrinsic 08/28/2010  . ATTENTION DEFICIT DISORDER, ADULT 01/19/2008  . RHINITIS MEDICAMENTOSA 12/08/2007  . ALLERGIC RHINITIS 12/08/2007  . IBS 12/08/2007  . ECZEMA 12/08/2007  . 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 Migraine headache 04/24/2007  . Essential hypertension  04/24/2007  . ASVD 04/24/2007  . GERD 04/24/2007  . VSD 04/24/2007  . 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 ASD AND VSD REPAIR  1989  . CARDIAC CATHETERIZATION  06/18/2006  . CESAREAN SECTION  04/12/2009  . COARCTATION OF AORTA REPAIR  1989  . COLONOSCOPY N/A 05/20/2013   Procedure: COLONOSCOPY;  Surgeon: Danie Binder, MD;  Location: [facility];  Service: Endoscopy;  Laterality: N/A;  115-moved to [place] notified pt  . ESOPHAGOGASTRODUODENOSCOPY  04/04/2008     Normal esophagus/Mild patchy erythema in the antrum/ Normal duodenal bulb, normal small bowel biopsy  . FLEXIBLE SIGMOIDOSCOPY  04/04/2008     Small internal hemorrhoids (poor bowel prep)  . HEMORRHOID BANDING N/A 05/20/2013   Procedure: HEMORRHOID BANDING;  Surgeon: Danie Binder, MD;  Location: [facility];  Service: Endoscopy;  Laterality: N/A;  . NASAL SEPTOPLASTY W/ TURBINOPLASTY Bilateral 05/31/2012   Procedure: NASAL SEPTOPLASTY WITH TURBINATE REDUCTION;  Surgeon: Ascencion Dike, MD;  Location: [facility];  Service: ENT;  Laterality: Bilateral;    OB History    Gravida  1   Para  1   Term  1   Preterm      AB      Living  1     SAB      TAB  Ectopic      Multiple      Live Births               Home Medications    Prior to Admission medications   Medication Sig Start Date End Date Taking? Authorizing Provider  acetaminophen (TYLENOL) 500 MG tablet Take 1,000-2,000 mg by mouth every 6 (six) hours as needed for mild pain, moderate pain, fever or headache.    [provider]  albuterol (PROVENTIL HFA;VENTOLIN HFA) 108 (90 Base) MCG/ACT inhaler Inhale 1-2 puffs into the lungs every 6 (six) hours as needed for wheezing or shortness of breath.    [provider]  ARIPiprazole (ABILIFY) 15 MG tablet Take 1 tablet (15 mg total) by mouth daily. 04/13/19   Arfeen, Arlyce Harman, MD  famotidine (PEPCID) 20 MG tablet  Take 1 tablet (20 mg total) by mouth 2 (two) times daily. 12/26/18   Kirichenko, Tatyana, PA-C  FLUoxetine (PROZAC) 40 MG capsule Take 1 capsule (40 mg total) by mouth daily. 04/13/19   Arfeen, Arlyce Harman, MD  fluticasone (FLONASE) 50 MCG/ACT nasal spray USE 2 SPRAYS IN EACH NOSTRIL EVERY DAY Patient taking differently: USE 2 SPRAYS IN EACH NOSTRIL EVERY DAY AS NEEDED FOR ALLERGIES 09/21/17   Mikey Kirschner, MD  HYDROCORTISONE EX Apply 1 application topically as needed (ECZEMA).    [provider]  hydrOXYzine (ATARAX/VISTARIL) 25 MG tablet Take 1 tablet (25 mg total) by mouth 2 (two) times daily as needed for anxiety. 04/13/19   Arfeen, Arlyce Harman, MD  lamoTRIgine (LAMICTAL) 200 MG tablet Take 1 tablet (200 mg total) by mouth at bedtime. 04/13/19   Arfeen, Arlyce Harman, MD  lisdexamfetamine (VYVANSE) 40 MG capsule Take 1 capsule (40 mg total) by mouth daily. 04/13/19   Kathlee Nations, MD  methylPREDNISolone (MEDROL DOSEPAK) 4 MG TBPK tablet tad 05/20/19   Raylene Everts, MD  metoprolol succinate (TOPROL-XL) 25 MG 24 hr tablet TAKE 1/2 TABLET BY MOUTH AT BEDTIME FOR PALPITATIONS 01/27/19   [name], MD  omeprazole (PRILOSEC) 20 MG capsule 1 PO 30 mins prior to breakfast and supper 12/30/18   Fields, Marga Melnick, MD  sucralfate (CARAFATE) 1 g tablet Take 1 tablet (1 g total) by mouth 4 (four) times daily -  with meals and at bedtime. 12/26/18   Kirichenko, Lahoma Rocker, PA-C  traZODone (DESYREL) 50 MG tablet Take 1 tablet (50 mg total) by mouth at bedtime as needed for sleep. Patient not taking: Reported on 04/13/2019 04/04/19   Arfeen, Arlyce Harman, MD  promethazine (PHENERGAN) 25 MG tablet Take 1 tablet (25 mg total) by mouth every 6 (six) hours as needed for nausea or vomiting. 02/28/19 05/20/19  McDonald, Laymond Purser, PA-C    Family History Family History  Problem Relation Age of Onset  . Hyperlipidemia Mother   . Hypertension Mother   . Depression Mother   . Physical abuse Mother   . Sexual abuse Mother         possibly  . Sleep apnea Mother   . Emphysema Paternal Grandmother   . Heart disease Paternal Grandmother   . Bipolar disorder Paternal Grandmother   . Dementia Paternal Grandmother   . Heart disease Maternal Grandmother   . Dementia Maternal Grandmother   . Stroke Maternal Grandfather   . Prostate cancer Maternal Grandfather   . Personality disorder Father   . Bipolar disorder Father   . Alcohol abuse Father   . ADD / ADHD Sister   .  Anxiety disorder Brother   . ADD / ADHD Brother   . ADD / ADHD Brother   . Seizures Daughter   . Drug abuse Neg Hx   . OCD Neg Hx   . Paranoid behavior Neg Hx   . Schizophrenia Neg Hx     Social History Social History   Tobacco Use  . Smoking status: Current Every Day Smoker    Packs/day: 0.50    Years: 10.00    Pack years: 5.00    Types: Cigarettes    Start date: 02/27/2002  . Smokeless tobacco: Never Used  . Tobacco comment: has cut back to less than 1/2 a pack and trying to quit  Substance Use Topics  . 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 Drug use: No    Comment: marijuana and pain pills; 12/08/07-smoked x 1 month ago- this was the first time in 2.years.  Morphine Sulfate and oxycontin - none since age 40     Allergies   Sulfonamide derivatives, Coconut flavor, and Coconut oil   Review of Systems Review of Systems See HPI  Physical Exam Triage Vital Signs ED Triage Vitals  Enc Vitals Group     BP 05/25/19 1907 (!) 143/68     Pulse Rate 05/25/19 1907 100     Resp 05/25/19 1907 18     Temp 05/25/19 1907 98.9 F (37.2 C)     Temp Source 05/25/19 1907 Oral     SpO2 05/25/19 1907 94 %     Weight --      Height --      Head Circumference --      Peak Flow --      Pain Score 05/25/19 1911 7     Pain Loc --      Pain Edu? --      Excl. in [place]? --    No data found.  Updated Vital Signs BP (!) 143/68 (BP Location: Right Arm)   Pulse 100    Temp 98.9 F (37.2 C) (Oral)   Resp 18   SpO2 94%   Visual Acuity Right Eye Distance:   Left Eye Distance:   Bilateral Distance:    Right Eye Near:   Left Eye Near:    Bilateral Near:     Physical Exam Gen: NAD, alert, cooperative with exam, well-appearing Skin: no rashes, no areas of induration  Neuro: normal tone, normal sensation to touch Psych:  normal insight, alert and oriented MSK:  Left knee: Normal range of motion. No instability. Normal strength resistance. No tenderness to palpation over the medial or lateral joint line. Pain with patellar grind. Neurovascularly intact   UC Treatments / Results  Labs (all labs ordered are listed, but only abnormal results are displayed) Labs Reviewed - No data to display  EKG   Radiology No results found.  Procedures Procedures (including critical care time)  Medications Ordered in UC Medications - No data to display  Initial Impression / Assessment and Plan / UC Course  I have reviewed the triage vital signs and the nursing notes.  Pertinent labs & imaging results that were available during my care of the patient were reviewed by me and considered in my medical decision making (see chart for details).     Ms. Stade is a 34 year old female is presenting with left knee pain.  Seems to be likely related to patellofemoral syndrome.  She was provided a web brace.  Counseled on home exercise therapy and supportive care.  If no improvement can consider physical therapy, injection or advanced imaging.  Given indications to return to follow-up.  Final Clinical Impressions(s) / UC Diagnoses   Final diagnoses:  Patellofemoral pain syndrome of left knee     Discharge Instructions     Please try Ibuprofen and tylenol  Please try exercises  Please try ice  Please try the brace  Please follow up if your symptoms fail to improve.     ED Prescriptions    None     PDMP not reviewed this encounter.   Rosemarie Ax, MD 05/25/19 [id number]

## 2019-05-25 NOTE — Discharge Instructions (Signed)
Please try Ibuprofen and tylenol  Please try exercises  Please try ice  Please try the brace  Please follow up if your symptoms fail to improve.

## 2019-05-27 ENCOUNTER — Telehealth (HOSPITAL_COMMUNITY): Payer: Self-pay | Admitting: *Deleted

## 2019-05-27 NOTE — Telephone Encounter (Signed)
Pt called requesting a refill on VyVanse 40mg  last written 04/13/19, pt has na upcoming appointment on 07/12/19. Please review.

## 2019-05-27 NOTE — Telephone Encounter (Signed)
She should have prescription from 05/06/19. She is not due until 06/03/19

## 2019-06-01 ENCOUNTER — Telehealth (HOSPITAL_COMMUNITY): Payer: Self-pay | Admitting: *Deleted

## 2019-06-01 DIAGNOSIS — F900 Attention-deficit hyperactivity disorder, predominantly inattentive type: Secondary | ICD-10-CM

## 2019-06-01 MED ORDER — LISDEXAMFETAMINE DIMESYLATE 40 MG PO CAPS: 40.0000 mg | ORAL_CAPSULE | Freq: Every day | ORAL | 0 refills | Status: DC

## 2019-06-01 NOTE — Telephone Encounter (Signed)
Yes. Prescription not due until 06/03/19

## 2019-06-01 NOTE — Telephone Encounter (Signed)
Writer received another refill request from pt for VyVanse. Writer called pt and had to leave VM that that prescription cannot be filled before 06/03/19. Pt encouraged to call nurse back with any questions or concerns.

## 2019-06-21 ENCOUNTER — Telehealth: Payer: Self-pay | Admitting: Internal Medicine

## 2019-06-21 NOTE — Telephone Encounter (Signed)
Patient c/o Palpitations:  High priority if patient c/o lightheadedness, shortness of breath, or chest pain  1) How long have you had palpitations/irregular HR/ Afib? Are you having the symptoms now? palpitations  2) Are you currently experiencing lightheadedness, SOB or CP? lightheaded  3) Do you have a history of afib (atrial fibrillation) or irregular heart rhythm? Yes, palpitations  4) Have you checked your BP or HR? (document readings if available): 126/86 and HR 91  5) Are you experiencing any other symptoms? Just had covid shot, she is still with the pharmacist who administered it. Pharmacist recommended she call us. She has some tingling in her legs, and chest is tingling.

## 2019-06-21 NOTE — Telephone Encounter (Signed)
The patient called because she had her CoVid vaccine at 2:10 pm and is still at the facility being observed by a pharmacist.    She is experiencing leg tingling, palpitations and chest "tightness".  She will continue under observation and go to the ED if symptoms persist.  She verbalized understanding.

## 2019-07-01 ENCOUNTER — Other Ambulatory Visit (HOSPITAL_COMMUNITY): Payer: Self-pay | Admitting: *Deleted

## 2019-07-01 ENCOUNTER — Telehealth (HOSPITAL_COMMUNITY): Payer: Self-pay | Admitting: *Deleted

## 2019-07-01 ENCOUNTER — Other Ambulatory Visit (HOSPITAL_COMMUNITY): Payer: Self-pay | Admitting: Psychiatry

## 2019-07-01 DIAGNOSIS — F900 Attention-deficit hyperactivity disorder, predominantly inattentive type: Secondary | ICD-10-CM

## 2019-07-01 DIAGNOSIS — F3131 Bipolar disorder, current episode depressed, mild: Secondary | ICD-10-CM

## 2019-07-01 MED ORDER — TRAZODONE HCL 50 MG PO TABS: 50.0000 mg | ORAL_TABLET | Freq: Every evening | ORAL | 0 refills | Status: DC | PRN

## 2019-07-01 MED ORDER — LISDEXAMFETAMINE DIMESYLATE 40 MG PO CAPS: 40.0000 mg | ORAL_CAPSULE | Freq: Every day | ORAL | 0 refills | Status: DC

## 2019-07-01 NOTE — Telephone Encounter (Signed)
Is Dr. Adele Schilder out of town that you are asking me for a refill for his patient?

## 2019-07-01 NOTE — Telephone Encounter (Signed)
Pt called requesting refill on VyVanse 40mg  written on 06/01/19, and her Trazodone. Writer refilled Trazodone. Pt has an upcoming appointment with Dr.Aeffen on 07/12/19. Please review.

## 2019-07-12 ENCOUNTER — Ambulatory Visit (HOSPITAL_COMMUNITY): Payer: Self-pay | Admitting: Psychiatry

## 2019-07-18 ENCOUNTER — Ambulatory Visit (INDEPENDENT_AMBULATORY_CARE_PROVIDER_SITE_OTHER): Payer: Medicaid Other | Admitting: Psychiatry

## 2019-07-18 ENCOUNTER — Other Ambulatory Visit (HOSPITAL_COMMUNITY): Payer: Self-pay | Admitting: *Deleted

## 2019-07-18 ENCOUNTER — Encounter (HOSPITAL_COMMUNITY): Payer: Self-pay | Admitting: Psychiatry

## 2019-07-18 ENCOUNTER — Other Ambulatory Visit: Payer: Self-pay

## 2019-07-18 DIAGNOSIS — F3131 Bipolar disorder, current episode depressed, mild: Secondary | ICD-10-CM

## 2019-07-18 DIAGNOSIS — F411 Generalized anxiety disorder: Secondary | ICD-10-CM

## 2019-07-18 DIAGNOSIS — F900 Attention-deficit hyperactivity disorder, predominantly inattentive type: Secondary | ICD-10-CM

## 2019-07-18 MED ORDER — FLUOXETINE HCL 40 MG PO CAPS: 40.0000 mg | ORAL_CAPSULE | Freq: Every day | ORAL | 2 refills | Status: DC

## 2019-07-18 MED ORDER — ARIPIPRAZOLE 15 MG PO TABS: 15.0000 mg | ORAL_TABLET | Freq: Every day | ORAL | 2 refills | Status: DC

## 2019-07-18 MED ORDER — LISDEXAMFETAMINE DIMESYLATE 40 MG PO CAPS: 40.0000 mg | ORAL_CAPSULE | Freq: Every day | ORAL | 0 refills | Status: DC

## 2019-07-18 MED ORDER — HYDROXYZINE HCL 25 MG PO TABS: 25.0000 mg | ORAL_TABLET | Freq: Two times a day (BID) | ORAL | 2 refills | Status: DC | PRN

## 2019-07-18 MED ORDER — LAMOTRIGINE 200 MG PO TABS: 200.0000 mg | ORAL_TABLET | Freq: Every day | ORAL | 2 refills | Status: DC

## 2019-07-18 NOTE — Progress Notes (Signed)
Virtual Visit via Telephone Note  I connected with Kristin Stephens on 07/18/19 at  8:40 AM EDT by telephone and verified that I am speaking with the correct person using two identifiers.   I discussed the limitations, risks, security and privacy concerns of performing an evaluation and management service by telephone and the availability of in person appointments. I also discussed with the patient that there may be a patient responsible charge related to this service. The patient expressed understanding and agreed to proceed.   History of Present Illness: Patient was evaluated by phone session.  She is compliant with medication.  She takes trazodone only when she cannot sleep.  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  She is sleeping good.  She remains sober from drinking.  She denies any irritability, anger or any mood swings.  Her relationship with the boyfriend is also going well.  She feels proud that she has been not drinking and remain sober.  She continues to visit [name] to see her daughter.  Her energy level is good.  Her weight is stable.  She has no tremors, shakes or any EPS.   Past Psychiatric History:Reviewed. H/Omood swing, depression, anger,overdoseandslashing her wrist.Did IOP in 2016. Inpatientin October 2018.H/O ETOH.Took Adderall, Ativan,Gabapentin,Wellbutrin but limited response.    Psychiatric Specialty Exam: Physical Exam  Review of Systems  Weight 215 lb (97.5 kg).There is no height or weight on file to calculate BMI.  General Appearance: NA  Eye Contact:  NA  Speech:  Clear and Coherent and Normal Rate  Volume:  Normal  Mood:  Euthymic  Affect:  NA  Thought Process:  Goal Directed  Orientation:  Full (Time, Place, and Person)  Thought Content:  WDL  Suicidal Thoughts:  No  Homicidal Thoughts:  No  Memory:  Immediate;   Good Recent;   Good Remote;   Good  Judgement:  Good   Insight:  Good  Psychomotor Activity:  NA  Concentration:  Concentration: Good and Attention Span: Good  Recall:  Good  Fund of Knowledge:  Good  Language:  Good  Akathisia:  No  Handed:  Right  AIMS (if indicated):     Assets:  Communication Skills Desire for Improvement Housing Resilience  ADL's:  Intact  Cognition:  WNL  Sleep:   good      Assessment and Plan: Bipolar disorder type I.  ADD, inattentive type.  Anxiety.  Patient doing well on her current medication.  She has no tremors shakes or any EPS.  I will continue Abilify 15 mg in the morning, hydroxyzine 25 mg twice a day, Lamictal 200 mg daily, Vyvanse 40 mg in the morning and Prozac 40 mg daily.  We will do blood work and patient will let us now when she is taking day off to do the blood work.  Discussed medication side effects and benefits.  Recommended to call us back if she is any question or any concern.  She does not need trazodone for now but I recommend to call us back if she needs a new refill.  Follow-up in 3 months.    Follow Up Instructions:    I discussed the assessment and treatment plan with the patient. The patient was provided an opportunity to ask questions and all were answered. The patient agreed with the plan and demonstrated an understanding of the instructions.   The patient was advised to call  back or seek an in-person evaluation if the symptoms worsen or if the condition fails to improve as anticipated.  I provided 15 minutes of non-face-to-face time during this encounter.   Kathlee Nations, MD

## 2019-08-08 ENCOUNTER — Telehealth: Payer: Self-pay | Admitting: Internal Medicine

## 2019-08-08 DIAGNOSIS — Q249 Congenital malformation of heart, unspecified: Secondary | ICD-10-CM

## 2019-08-08 NOTE — Telephone Encounter (Signed)
   Pt would like to know if she needs echo before seeing Dr. Harrington Challenger

## 2019-08-12 NOTE — Telephone Encounter (Signed)
Would you like for patient to have echo prior to appointment?  Last echo was 05/2018.

## 2019-08-12 NOTE — Telephone Encounter (Signed)
Yes, follow up with echo

## 2019-08-30 ENCOUNTER — Telehealth (HOSPITAL_COMMUNITY): Payer: Self-pay | Admitting: *Deleted

## 2019-08-30 DIAGNOSIS — F900 Attention-deficit hyperactivity disorder, predominantly inattentive type: Secondary | ICD-10-CM

## 2019-08-30 MED ORDER — LISDEXAMFETAMINE DIMESYLATE 40 MG PO CAPS: 40.0000 mg | ORAL_CAPSULE | Freq: Every day | ORAL | 0 refills | Status: DC

## 2019-08-30 NOTE — Telephone Encounter (Signed)
Received request for refill of pt VyVanse 40 mg last ordered on 07/18/19. Pt has an upcoming appointment on 10/21/19. Please review.

## 2019-08-30 NOTE — Telephone Encounter (Signed)
Done

## 2019-09-02 ENCOUNTER — Other Ambulatory Visit (HOSPITAL_COMMUNITY): Payer: Self-pay | Admitting: Psychiatry

## 2019-09-02 DIAGNOSIS — F3131 Bipolar disorder, current episode depressed, mild: Secondary | ICD-10-CM

## 2019-09-03 ENCOUNTER — Other Ambulatory Visit (HOSPITAL_COMMUNITY): Payer: Self-pay | Admitting: Psychiatry

## 2019-09-03 DIAGNOSIS — F900 Attention-deficit hyperactivity disorder, predominantly inattentive type: Secondary | ICD-10-CM

## 2019-09-05 ENCOUNTER — Ambulatory Visit (HOSPITAL_COMMUNITY): Payer: 59 | Attending: Cardiovascular Disease

## 2019-09-05 ENCOUNTER — Other Ambulatory Visit: Payer: Self-pay

## 2019-09-05 DIAGNOSIS — Q249 Congenital malformation of heart, unspecified: Secondary | ICD-10-CM | POA: Diagnosis present

## 2019-09-06 ENCOUNTER — Telehealth (HOSPITAL_COMMUNITY): Payer: Self-pay

## 2019-09-06 NOTE — Telephone Encounter (Signed)
Received fax from pharmacy stating that patient's Vyvanse 40mg  is not covered by insurance. Atomoxetine 40mg  capsule and Guanfacine 1mg  tablet 24hr do not require a PA. Please review and advise. Thank you.

## 2019-09-06 NOTE — Telephone Encounter (Signed)
She can try Strattera 40 mg.

## 2019-09-07 ENCOUNTER — Encounter: Payer: Self-pay | Admitting: Obstetrics and Gynecology

## 2019-09-07 ENCOUNTER — Ambulatory Visit: Payer: 59 | Admitting: Obstetrics and Gynecology

## 2019-09-07 ENCOUNTER — Other Ambulatory Visit (HOSPITAL_COMMUNITY): Payer: Self-pay

## 2019-09-07 ENCOUNTER — Encounter: Payer: 59 | Admitting: Obstetrics and Gynecology

## 2019-09-07 ENCOUNTER — Other Ambulatory Visit: Payer: Self-pay

## 2019-09-07 VITALS — BP 128/82 | HR 64 | Temp 98.4°F | Resp 20 | Ht 65.5 in | Wt 227.0 lb

## 2019-09-07 DIAGNOSIS — N912 Amenorrhea, unspecified: Secondary | ICD-10-CM

## 2019-09-07 DIAGNOSIS — Z3009 Encounter for other general counseling and advice on contraception: Secondary | ICD-10-CM | POA: Diagnosis not present

## 2019-09-07 LAB — POCT URINE PREGNANCY: Preg Test, Ur: NEGATIVE

## 2019-09-07 MED ORDER — MEDROXYPROGESTERONE ACETATE 150 MG/ML IM SUSP
150.0000 mg | Freq: Once | INTRAMUSCULAR | Status: AC
  Administered 2019-09-07: 150 mg via INTRAMUSCULAR

## 2019-09-07 MED ORDER — ATOMOXETINE HCL 40 MG PO CAPS: 40.0000 mg | ORAL_CAPSULE | Freq: Every day | ORAL | 0 refills | Status: DC

## 2019-09-07 NOTE — Telephone Encounter (Signed)
Sent in script for Atomoxetine (Strattera) 40mg  and notified patient

## 2019-09-07 NOTE — Progress Notes (Signed)
GYNECOLOGY  VISIT   HPI: 34 y.o.   Single  Caucasian  female   [id number] with No LMP recorded. Patient has had an injection.   here for amenorrhea.  Patient began Depo Provera [place] 03/2018. Was unable to receive after October due to COVID demand. She has been without since. She was due in January, 2021.  No menses since. She would like to get back on Depo. Has done pregnancy tests which have been negative.  She does have periodic brown discharge lasting for 1 - 2 days, last occurring yesterday and today.  She thinks this has occurred twice since January, 2021.  No nipple discharge or increased hair growth.  Migraines occasionally, couple of times a year.   LMP was having regular menses for 4 - 5 years prior start the Depo Provera in 03/2018.  Declines future childbearing.  Had PP depression.   Daughter living with her sister.  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  She is treated for bipolar disorder with depression.  States the Depo Provera does not worsen her depression.  Abilify helps.   Steady partner since 2019.   UPT negative.   GYNECOLOGIC HISTORY: No LMP recorded. Patient has had an injection. Contraception:  None Menopausal hormone therapy:  n/a Last mammogram:  n/a Last pap smear: 2020 normal per patient, 10-26-14 Neg:Neg HR HPV--hx of abnormal pap with cryotherapy to cervix age 69 per patient--normal since.        OB History    Gravida  1   Para  1   Term  1   Preterm      AB      Living  1     SAB      TAB      Ectopic      Multiple      Live Births                 Patient Active Problem List   Diagnosis Date Noted  . Intentional overdose of drug in tablet form ([place]) 01/26/2017  . Bipolar 1 disorder, depressed, severe ([place]) 01/26/2017  . Bipolar 2 disorder ([place]) 05/16/2014  . Internal hemorrhoids with complication 78/46/9629  . Vasomotor rhinitis 04/15/2013  . Gastritis without bleeding 03/10/2013  . Anal fissure 03/10/2013  . Obsessive  compulsive disorder 03/07/2013  . Fibromyalgia 02/06/2013  . Detachment of glenoid labrum 01/13/2013  . Difficulty in walking(719.7) 02/18/2012  . Seasonal affective disorder ([place]) 11/25/2011  . Asthma, intrinsic 08/28/2010  . ATTENTION DEFICIT DISORDER, ADULT 01/19/2008  . RHINITIS MEDICAMENTOSA 12/08/2007  . ALLERGIC RHINITIS 12/08/2007  . IBS 12/08/2007  . ECZEMA 12/08/2007  . 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 Migraine headache 04/24/2007  . Essential hypertension 04/24/2007  . ASVD 04/24/2007  . GERD 04/24/2007  . VSD 04/24/2007  . 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 Abnormal Pap smear of cervix    age 38 with hx of cryotherapy  . ADHD (attention deficit hyperactivity disorder)   . Anxiety   . Asthma    daily and prn inhalers  . Bipolar affective disorder ([place])   . Dental crowns present   . Depression   . Deviated nasal septum 05/2012  . Eczema    right leg  . Esophageal reflux   . Fibromyalgia   . Heart murmur   . Hypertension   . IBS (irritable bowel syndrome)   . Migraine headache    with aura  .  Nasal turbinate hypertrophy 05/2012   bilateral  . OCD (obsessive compulsive disorder)   . STD (sexually transmitted disease)    Hx of abnormal pap--age 42   . Substance abuse ([place]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  . TMJ syndrome     Past Surgical History:  Procedure Laterality Date  . ASD AND VSD REPAIR  1989  . CARDIAC CATHETERIZATION  06/18/2006  . CESAREAN SECTION  04/12/2009  . COARCTATION OF AORTA REPAIR  1989  . COLONOSCOPY N/A 05/20/2013   Procedure: COLONOSCOPY;  Surgeon: Danie Binder, MD;  [facility];  Service: Endoscopy;  Laterality: N/A;  115-moved to [place] notified pt  . ESOPHAGOGASTRODUODENOSCOPY  04/04/2008     Normal esophagus/Mild patchy erythema in the antrum/ Normal duodenal bulb, normal small bowel biopsy  . FLEXIBLE SIGMOIDOSCOPY  04/04/2008     Small internal hemorrhoids (poor  bowel prep)  . HEMORRHOID BANDING N/A 05/20/2013   Procedure: HEMORRHOID BANDING;  Surgeon: Danie Binder, MD;  [facility];  Service: Endoscopy;  Laterality: N/A;  . NASAL SEPTOPLASTY W/ TURBINOPLASTY Bilateral 05/31/2012   Procedure: NASAL SEPTOPLASTY WITH TURBINATE REDUCTION;  Surgeon: Ascencion Dike, MD;  Location: [place];  Service: ENT;  Laterality: Bilateral;    Current Outpatient Medications  Medication Sig Dispense Refill  . acetaminophen (TYLENOL) 500 MG tablet Take 1,000-2,000 mg by mouth every 6 (six) hours as needed for mild pain, moderate pain, fever or headache.    . albuterol (PROVENTIL HFA;VENTOLIN HFA) 108 (90 Base) MCG/ACT inhaler Inhale 1-2 puffs into the lungs every 6 (six) hours as needed for wheezing or shortness of breath.    . ARIPiprazole (ABILIFY) 15 MG tablet Take 1 tablet (15 mg total) by mouth daily. 30 tablet 2  . atomoxetine (STRATTERA) 40 MG capsule Take 1 capsule (40 mg total) by mouth daily. 30 capsule 0  . famotidine (PEPCID) 20 MG tablet Take 1 tablet (20 mg total) by mouth 2 (two) times daily. 30 tablet 0  . FLUoxetine (PROZAC) 40 MG capsule Take 1 capsule (40 mg total) by mouth daily. 30 capsule 2  . fluticasone (FLONASE) 50 MCG/ACT nasal spray USE 2 SPRAYS IN EACH NOSTRIL EVERY DAY (Patient taking differently: USE 2 SPRAYS IN EACH NOSTRIL EVERY DAY AS NEEDED FOR ALLERGIES) 16 g 5  . HYDROCORTISONE EX Apply 1 application topically as needed (ECZEMA).    . hydrOXYzine (ATARAX/VISTARIL) 25 MG tablet Take 1 tablet (25 mg total) by mouth 2 (two) times daily as needed for anxiety. 60 tablet 2  . lamoTRIgine (LAMICTAL) 200 MG tablet Take 1 tablet (200 mg total) by mouth at bedtime. 30 tablet 2  . lisdexamfetamine (VYVANSE) 40 MG capsule Take 1 capsule (40 mg total) by mouth daily. 30 capsule 0  . loratadine (CLARITIN) 10 MG tablet Take 1 tablet by mouth as needed.    . methylPREDNISolone (MEDROL DOSEPAK) 4 MG TBPK tablet tad 21 tablet 0   . metoprolol succinate (TOPROL-XL) 25 MG 24 hr tablet TAKE 1/2 TABLET BY MOUTH AT BEDTIME FOR PALPITATIONS 15 tablet 11  . omeprazole (PRILOSEC) 20 MG capsule 1 PO 30 mins prior to breakfast and supper 60 capsule 11  . sucralfate (CARAFATE) 1 g tablet Take 1 tablet (1 g total) by mouth 4 (four) times daily -  with meals and at bedtime. 30 tablet 0  . traZODone (DESYREL) 50 MG tablet Take 1 tablet (50 mg total) by mouth at bedtime as needed for sleep.  30 tablet 0   No current facility-administered medications for this visit.     ALLERGIES: Sulfonamide derivatives, Coconut flavor, and Coconut oil  Family History  Problem Relation Age of Onset  . Hyperlipidemia Mother   . Hypertension Mother   . Depression Mother   . Physical abuse Mother   . Sexual abuse Mother        possibly  . Sleep apnea Mother   . Stroke Mother   . Emphysema Paternal Grandmother   . Heart disease Paternal Grandmother   . Bipolar disorder Paternal Grandmother   . Dementia Paternal Grandmother   . Heart disease Maternal Grandmother   . Dementia Maternal Grandmother   . Stroke Maternal Grandfather   . Prostate cancer Maternal Grandfather   . Thyroid disease Maternal Grandfather   . Personality disorder Father   . Bipolar disorder Father   . Alcohol abuse Father   . ADD / ADHD Sister   . Anxiety disorder Brother   . ADD / ADHD Brother   . Seizures Daughter   . Drug abuse Neg Hx   . OCD Neg Hx   . Paranoid behavior Neg Hx   . Schizophrenia Neg Hx     Social History   Socioeconomic History  . Marital status: Single    Spouse name: Not on file  . Number of children: 1  . Years of education: GED  . Highest education level: Not on file  Occupational History  . Occupation: [employer]  Tobacco Use  . Smoking status: Current Every Day Smoker    Packs/day: 0.50    Years: 10.00    Pack years: 5.00    Types: Cigarettes    Start date: 02/27/2002  . Smokeless tobacco: Never Used  . Tobacco comment: has cut  back to less than 1/2 a pack and trying to quit  Substance and Sexual Activity  . 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 Drug use: No  . Sexual activity: Yes  Other Topics Concern  . Not on file  Social History Narrative   Lives with 3 roommates   LIKES TO WATCH DR. PIMPLE POPPER. AT AGE 64 LIKED TO PICK HER NOSE AND EAT THE DRIED MUCOUS.    Caffeine: coffee, soda, occasional Red Bull.   HAS FAMILY MONEY AND NO JOB.   Social Determinants of Health   Financial Resource Strain:   . Difficulty of Paying Living Expenses:   Food Insecurity:   . Worried About Charity fundraiser in the Last Year:   . Arboriculturist in the Last Year:   Transportation Needs:   . Film/video editor (Medical):   Marland Kitchen Lack of Transportation (Non-Medical):   Physical Activity:   . Days of Exercise per Week:   . Minutes of Exercise per Session:   Stress:   . Feeling of Stress :   Social Connections:   . Frequency of Communication with Friends and Family:   . Frequency of Social Gatherings with Friends and Family:   . Attends Religious Services:   . Active Member of Clubs or Organizations:   . Attends Archivist Meetings:   Marland Kitchen Marital Status:   Intimate Partner Violence:   . Fear of Current or Ex-Partner:   . Emotionally Abused:   Marland Kitchen Physically Abused:   . Sexually Abused:     Review of Systems  All other systems  reviewed and are negative.   PHYSICAL EXAMINATION:    BP 128/82 (Cuff Size: Large)   Pulse 64   Temp 98.4 F (36.9 C) (Temporal)   Resp 20   Ht 5' 5.5" (1.664 m)   Wt 227 lb (103 kg)   BMI 37.20 kg/m     General appearance: alert, cooperative and appears stated age Head: Normocephalic, without obvious abnormality, atraumatic Lungs: clear to auscultation bilaterally Heart: regular rate and rhythm Abdomen: soft, non-tender, no masses,  no organomegaly Extremities: extremities normal,  atraumatic, no cyanosis or edema Skin: Skin color, texture, turgor normal. No rashes or lesions No abnormal inguinal nodes palpated   Pelvic: External genitalia:  no lesions              Urethra:  normal appearing urethra with no masses, tenderness or lesions              Bartholins and Skenes: normal                 Vagina: normal appearing vagina with normal color and discharge, no lesions              Cervix: no lesions.  Red blood from cervix.                 Bimanual Exam:  Uterus:  normal size, contour, position, consistency, mobility, non-tender              Adnexa: no mass, fullness, tenderness             Chaperone was present for exam.  ASSESSMENT  Desire for long acting contraception.  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  Bipolar disorder.  PLAN  We discussed long acting contraception - IUDs, Nexplanon, Depo Provera. She will start back on Depo Provera today - 150 mg IM.  FU in 12 weeks for next Depo Provera and for annual exam.  An After Visit Summary to the patient.  ___30___ minutes face to face time of which over 50% was spent in counseling.

## 2019-09-26 ENCOUNTER — Encounter: Payer: Self-pay | Admitting: Internal Medicine

## 2019-09-26 ENCOUNTER — Other Ambulatory Visit: Payer: Self-pay

## 2019-09-26 ENCOUNTER — Ambulatory Visit: Payer: Self-pay | Admitting: Internal Medicine

## 2019-09-26 VITALS — BP 124/82 | HR 92 | Ht 65.0 in | Wt 228.4 lb

## 2019-09-26 DIAGNOSIS — Q251 Coarctation of aorta: Secondary | ICD-10-CM

## 2019-09-26 DIAGNOSIS — R002 Palpitations: Secondary | ICD-10-CM

## 2019-09-26 MED ORDER — METOPROLOL SUCCINATE ER 25 MG PO TB24
25.0000 mg | ORAL_TABLET | Freq: Every day | ORAL | 3 refills | Status: DC
Start: 2019-09-26 — End: 2020-09-04

## 2019-09-26 NOTE — Progress Notes (Signed)
Cardiology Office Note   Date:  09/26/2019   ID:  Kristin Stephens, Kristin Stephens Sep 17, 1985, MRN [id number]  PCP:  Mikey Kirschner, MD  Cardiologist:   Dorris Carnes, MD   F/U of Congenital heart dz and aortic insufficency     History of Present Illness: Kristin Stephens is a 34 y.o. female with a history of ASD/VSD/Coarctation.  She is s/p repair  MRI of chest in Oct 2017 showed Aortic root was very mildy dilated.  Coarc repair site OK   Echo done in March 2020:   LVEF/RVEF normal   Mild AI   Peak velocity down in descending aorta was 1.8 m/sec  The pt still notes palpitaitons at night   Not at other times   No dizziness    She denies SOB   No CP   No edema     Outpatient Medications Prior to Visit  Medication Sig Dispense Refill  . acetaminophen (TYLENOL) 500 MG tablet Take 1,000-2,000 mg by mouth every 6 (six) hours as needed for mild pain, moderate pain, fever or headache.    . albuterol (PROVENTIL HFA;VENTOLIN HFA) 108 (90 Base) MCG/ACT inhaler Inhale 1-2 puffs into the lungs every 6 (six) hours as needed for wheezing or shortness of breath.    . ARIPiprazole (ABILIFY) 15 MG tablet Take 1 tablet (15 mg total) by mouth daily. 30 tablet 2  . famotidine (PEPCID) 20 MG tablet Take 1 tablet (20 mg total) by mouth 2 (two) times daily. 30 tablet 0  . FLUoxetine (PROZAC) 40 MG capsule Take 1 capsule (40 mg total) by mouth daily. 30 capsule 2  . fluticasone (FLONASE) 50 MCG/ACT nasal spray USE 2 SPRAYS IN EACH NOSTRIL EVERY DAY 16 g 5  . HYDROCORTISONE EX Apply 1 application topically as needed (ECZEMA).    . hydrOXYzine (ATARAX/VISTARIL) 25 MG tablet Take 1 tablet (25 mg total) by mouth 2 (two) times daily as needed for anxiety. 60 tablet 2  . lamoTRIgine (LAMICTAL) 200 MG tablet Take 1 tablet (200 mg total) by mouth at bedtime. 30 tablet 2  . lisdexamfetamine (VYVANSE) 40 MG capsule Take 1 capsule (40 mg total) by mouth daily. 30 capsule 0  . loratadine (CLARITIN) 10 MG tablet Take 1 tablet by  mouth as needed.    . metoprolol succinate (TOPROL-XL) 25 MG 24 hr tablet TAKE 1/2 TABLET BY MOUTH AT BEDTIME FOR PALPITATIONS 15 tablet 11  . omeprazole (PRILOSEC) 20 MG capsule 1 PO 30 mins prior to breakfast and supper 60 capsule 11  . traZODone (DESYREL) 50 MG tablet Take 1 tablet (50 mg total) by mouth at bedtime as needed for sleep. 30 tablet 0  . atomoxetine (STRATTERA) 40 MG capsule Take 1 capsule (40 mg total) by mouth daily. (Patient not taking: Reported on 09/26/2019) 30 capsule 0  . methylPREDNISolone (MEDROL DOSEPAK) 4 MG TBPK tablet tad (Patient not taking: Reported on 09/26/2019) 21 tablet 0  . sucralfate (CARAFATE) 1 g tablet Take 1 tablet (1 g total) by mouth 4 (four) times daily -  with meals and at bedtime. (Patient not taking: Reported on 09/26/2019) 30 tablet 0   No facility-administered medications prior to visit.     Allergies:   Sulfonamide derivatives, Coconut flavor, and Coconut oil   Past Medical History:  Diagnosis Date  . Abnormal Pap smear of cervix    age 89 with hx of cryotherapy  . ADHD (attention deficit hyperactivity disorder)   . Anxiety   . Asthma  daily and prn inhalers  . Bipolar affective disorder ([place])   . Dental crowns present   . Depression   . Deviated nasal septum 05/2012  . Eczema    right leg  . Esophageal reflux   . Fibromyalgia   . Heart murmur   . Hypertension   . IBS (irritable bowel syndrome)   . Migraine headache    with aura  . Nasal turbinate hypertrophy 05/2012   bilateral  . OCD (obsessive compulsive disorder)   . STD (sexually transmitted disease)    Hx of abnormal pap--age 33   . 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 TMJ syndrome     Past Surgical History:  Procedure Laterality Date  . ASD AND VSD REPAIR  1989  . CARDIAC CATHETERIZATION  06/18/2006  . CESAREAN SECTION  04/12/2009  . COARCTATION OF AORTA REPAIR  1989  . COLONOSCOPY N/A 05/20/2013   Procedure: COLONOSCOPY;  Surgeon: Danie Binder, MD;   [facility];  Service: Endoscopy;  Laterality: N/A;  115-moved to [place] notified pt  . ESOPHAGOGASTRODUODENOSCOPY  04/04/2008     Normal esophagus/Mild patchy erythema in the antrum/ Normal duodenal bulb, normal small bowel biopsy  . FLEXIBLE SIGMOIDOSCOPY  04/04/2008     Small internal hemorrhoids (poor bowel prep)  . HEMORRHOID BANDING N/A 05/20/2013   Procedure: HEMORRHOID BANDING;  Surgeon: Danie Binder, MD;  [facility];  Service: Endoscopy;  Laterality: N/A;  . NASAL SEPTOPLASTY W/ TURBINOPLASTY Bilateral 05/31/2012   Procedure: NASAL SEPTOPLASTY WITH TURBINATE REDUCTION;  Surgeon: Ascencion Dike, MD;  Location: [facility] [place];  Service: ENT;  Laterality: Bilateral;     Social History:  The patient  reports that she has been smoking cigarettes. She started smoking about 17 years ago. She has a 5.00 pack-year smoking history. She has never used smokeless tobacco. She reports that she does not drink alcohol and does not use drugs.   Family History:  The patient's family history includes ADD / ADHD in her brother and sister; Alcohol abuse in her father; Anxiety disorder in her brother; Bipolar disorder in her father and paternal grandmother; Dementia in her maternal grandmother and paternal grandmother; Depression in her mother; Emphysema in her paternal grandmother; Heart disease in her maternal grandmother and paternal grandmother; Hyperlipidemia in her mother; Hypertension in her mother; Personality disorder in her father; Physical abuse in her mother; Prostate cancer in her maternal grandfather; Seizures in her daughter; Sexual abuse in her mother; Sleep apnea in her mother; Stroke in her maternal grandfather and mother; Thyroid disease in her maternal grandfather.    ROS:  Please see the history of present illness. All other systems are reviewed and  Negative to the above problem except as noted.    PHYSICAL EXAM: VS:  BP 124/82   Pulse 92   Ht 5'  5" (1.651 m)   Wt 228 lb 6.4 oz (103.6 kg)   SpO2 98%   BMI 38.01 kg/m   GEN: Well nourished, well developed, in no acute distress  HEENT: normal  Neck: JVP is normal  No carotid bruits Cardiac: RRR; no murmurs, rubs, or gallops,no edema  Respiratory:  clear to auscultation bilaterally, normal work of breathing GI: soft, nontender, nondistended, + BS  No hepatomegaly  MS: no deformity Moving all extremities   Skin: warm and dry, no rash Neuro:  Strength and sensation are intact Psych: euthymic mood, full affect   EKG:  EKG is  ordered     IVC IVC diam: 0.80 cm    Dorris Carnes MD Electronically signed by Dorris Carnes MD Signature Date/Time: 06/04/2018/7:01:34 PM   MRI 2017  IMPRESSION: 1. Surgical changes of pre ductal aortic coarctation repair without evidence of complicating feature. 2. Mild dilatation of the aortic root which measures in the range of 3.9- 4.1 cm. 3. No effacement of the sino-tubular junction or significant dilatation of the tubular portion of the ascending aorta which measures approximately 3.5 cm. 4. 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   Ollen Barges, MD   Lipid Panel    Component Value Date/Time   CHOL 175 12/30/2018 1007   TRIG 84 12/30/2018 1007   HDL 45 12/30/2018 1007   CHOLHDL 3.9 12/30/2018 1007   CHOLHDL 4.2 02/06/2007 0404   VLDL 14 02/06/2007 0404   LDLCALC 114 (H) 12/30/2018 1007      Wt Readings from Last 3 Encounters:  09/26/19 228 lb 6.4 oz (103.6 kg)  09/07/19 227 lb (103 kg)  05/20/19 215 lb (97.5 kg)      ASSESSMENT AND PLAN:  1  Congenital heart dz s/p ASD/ VSD/coarc  Pt is doing well  Echo looks good    WIll continue to follow    2  Palpitations  Usually at night   Not hemodynamically destabilizing WOuld increase Toprol to 25 mg daily    3  HCM  Encouraged her to increase  walking  Stay active     F/U in 12 months       Current medicines are reviewed at length with the patient today.  The patient does not have concerns regarding medicines.  Signed, Dorris Carnes, MD  09/26/2019 8:21 AM    [facility] [address], [place], [place]  [address] Phone: [phone]; Fax: [phone]

## 2019-09-26 NOTE — Patient Instructions (Signed)
Medication Instructions:  Your physician has recommended you make the following change in your medication:  1.) increase metoprolol succinate (Toprol XL) 25 mg to 1 whole tablet by mouth daily  *If you need a refill on your cardiac medications before your next appointment, please call your pharmacy*   Lab Work: None  Testing/Procedures: none   Follow-Up: At [facility], you and your health needs are our priority.  As part of our continuing mission to provide you with exceptional heart care, we have created designated Provider Care Teams.  These Care Teams include your primary Cardiologist (physician) and Advanced Practice Providers (APPs -  Physician Assistants and Nurse Practitioners) who all work together to provide you with the care you need, when you need it.  Your next appointment:   12 month(s)  The format for your next appointment:   In Person  Provider:   You may see Dorris Carnes, MD or one of the following Advanced Practice Providers on your designated Care Team:    Richardson Dopp, PA-C  Robbie Lis, [place]    Other Instructions

## 2019-09-30 ENCOUNTER — Other Ambulatory Visit (HOSPITAL_COMMUNITY): Payer: Self-pay | Admitting: Psychiatry

## 2019-09-30 DIAGNOSIS — F411 Generalized anxiety disorder: Secondary | ICD-10-CM

## 2019-10-06 ENCOUNTER — Telehealth (HOSPITAL_COMMUNITY): Payer: Self-pay | Admitting: *Deleted

## 2019-10-06 DIAGNOSIS — F900 Attention-deficit hyperactivity disorder, predominantly inattentive type: Secondary | ICD-10-CM

## 2019-10-06 MED ORDER — LISDEXAMFETAMINE DIMESYLATE 40 MG PO CAPS: 40.0000 mg | ORAL_CAPSULE | Freq: Every day | ORAL | 0 refills | Status: DC

## 2019-10-06 NOTE — Telephone Encounter (Signed)
Done

## 2019-10-06 NOTE — Telephone Encounter (Signed)
Pr called requesting refill of VyVanse 40mg  last ordered 08/30/19. Pt has an appointment on 10/21/19. Please review.

## 2019-10-21 ENCOUNTER — Telehealth (HOSPITAL_COMMUNITY): Payer: Self-pay | Admitting: Psychiatry

## 2019-11-03 ENCOUNTER — Other Ambulatory Visit: Payer: Self-pay

## 2019-11-03 ENCOUNTER — Telehealth (INDEPENDENT_AMBULATORY_CARE_PROVIDER_SITE_OTHER): Payer: 59 | Admitting: Psychiatry

## 2019-11-03 ENCOUNTER — Encounter (HOSPITAL_COMMUNITY): Payer: Self-pay | Admitting: Psychiatry

## 2019-11-03 DIAGNOSIS — F411 Generalized anxiety disorder: Secondary | ICD-10-CM

## 2019-11-03 DIAGNOSIS — F900 Attention-deficit hyperactivity disorder, predominantly inattentive type: Secondary | ICD-10-CM | POA: Diagnosis not present

## 2019-11-03 DIAGNOSIS — F3131 Bipolar disorder, current episode depressed, mild: Secondary | ICD-10-CM

## 2019-11-03 MED ORDER — FLUOXETINE HCL 40 MG PO CAPS: 40.0000 mg | ORAL_CAPSULE | Freq: Every day | ORAL | 2 refills | Status: DC

## 2019-11-03 MED ORDER — LAMOTRIGINE 200 MG PO TABS: 200.0000 mg | ORAL_TABLET | Freq: Every day | ORAL | 2 refills | Status: DC

## 2019-11-03 MED ORDER — ARIPIPRAZOLE 15 MG PO TABS: 15.0000 mg | ORAL_TABLET | Freq: Every day | ORAL | 2 refills | Status: DC

## 2019-11-03 MED ORDER — HYDROXYZINE HCL 25 MG PO TABS: 25.0000 mg | ORAL_TABLET | Freq: Every day | ORAL | 2 refills | Status: DC | PRN

## 2019-11-03 MED ORDER — LISDEXAMFETAMINE DIMESYLATE 40 MG PO CAPS: 40.0000 mg | ORAL_CAPSULE | Freq: Every day | ORAL | 0 refills | Status: DC

## 2019-11-03 MED ORDER — TRAZODONE HCL 50 MG PO TABS: 50.0000 mg | ORAL_TABLET | Freq: Every evening | ORAL | 0 refills | Status: DC | PRN

## 2019-11-03 NOTE — Progress Notes (Signed)
Virtual Visit via Telephone Note  I connected with Kristin Stephens on 11/03/19 at 11:20 AM EDT by telephone and verified that I am speaking with the correct person using two identifiers.  Location: Patient: home Provider: home office   I discussed the limitations, risks, security and privacy concerns of performing an evaluation and management service by telephone and the availability of in person appointments. I also discussed with the patient that there may be a patient responsible charge related to this service. The patient expressed understanding and agreed to proceed.   History of Present Illness: Patient is evaluated by phone session.  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  She is hoping that his job will help to grow.  She is in the process of getting new insurance.  Her Vyvanse is not covered and we tried Strattera but she decided to pay out-of-pocket to get Vyvanse.  She feels her attention, concentration is good.  She is able to do multitasking.  She is happy that her daughter Kristin Stephens is doing well and she is getting visit from her father.  She feels proud that she has been sober from drinking for 22 months.  She is sleeping good.  Recently she had a visit to cardiology and she was told to lose weight and she is trying to do that.  She denies any anger, mania, psychosis, hallucination or any paranoia.  She has no tremors or shakes.  We discussed polypharmacy and she is trying to see if she can take some of the medication only as needed.   Past Psychiatric History:Reviewed. H/Omood swing, depression, anger,overdoseandslashing her wrist.Did IOP in 2016. Inpatientin October 2018.H/O ETOH.Took Adderall, Ativan,Gabapentin,Wellbutrin but limited response.     Psychiatric Specialty Exam: Physical  Exam  Review of Systems  Weight 228 lb (103.4 kg).There is no height or weight on file to calculate BMI.  General Appearance: NA  Eye Contact:  NA  Speech:  Clear and Coherent  Volume:  Normal  Mood:  Euthymic  Affect:  NA  Thought Process:  Goal Directed  Orientation:  Full (Time, Place, and Person)  Thought Content:  WDL  Suicidal Thoughts:  No  Homicidal Thoughts:  No  Memory:  Immediate;   Good Recent;   Good Remote;   Good  Judgement:  Good  Insight:  Good  Psychomotor Activity:  NA  Concentration:  Concentration: Good and Attention Span: Good  Recall:  Good  Fund of Knowledge:  Good  Language:  Good  Akathisia:  No  Handed:  Right  AIMS (if indicated):     Assets:  Communication Skills Desire for Improvement Housing Resilience Social Support Talents/Skills Transportation  ADL's:  Intact  Cognition:  WNL  Sleep:         Assessment and Plan: Bipolar disorder type I.  ADD, inattentive type.  Anxiety.  Patient is a stable on her current medication.  She is taking hydroxyzine only once a day and trazodone only as needed.  Discussed polypharmacy.  Patient feels the current medicine is working and keeping her mood stable.  She has no rash or any itching.  We will continue Vyvanse 40 mg in the morning, Prozac 40 mg daily, hydroxyzine 25 mg as needed daily, trazodone 50 mg as needed for insomnia, Abilify 15 mg daily and Lamictal  200 mg daily.  We will consider lowering her medication in the future as we discussed polypharmacy.  Recommended to call us back if she is any question or any concern.  Follow-up in 3 months.  Follow Up Instructions:    I discussed the assessment and treatment plan with the patient. The patient was provided an opportunity to ask questions and all were answered. The patient agreed with the plan and demonstrated an understanding of the instructions.   The patient was advised to call back or seek an in-person evaluation if the symptoms worsen or  if the condition fails to improve as anticipated.  I provided 20 minutes of non-face-to-face time during this encounter.   Kristin Nations, MD

## 2019-11-18 ENCOUNTER — Telehealth (HOSPITAL_COMMUNITY): Payer: Medicaid Other | Admitting: Psychiatry

## 2019-11-29 ENCOUNTER — Other Ambulatory Visit: Payer: Self-pay

## 2019-11-29 ENCOUNTER — Ambulatory Visit (INDEPENDENT_AMBULATORY_CARE_PROVIDER_SITE_OTHER): Payer: Medicaid Other

## 2019-11-29 ENCOUNTER — Ambulatory Visit: Payer: 59 | Admitting: Obstetrics and Gynecology

## 2019-11-29 VITALS — BP 118/70 | HR 80 | Resp 14 | Ht 65.0 in | Wt 231.0 lb

## 2019-11-29 DIAGNOSIS — Z3042 Encounter for surveillance of injectable contraceptive: Secondary | ICD-10-CM

## 2019-11-29 MED ORDER — MEDROXYPROGESTERONE ACETATE 150 MG/ML IM SUSP
150.0000 mg | Freq: Once | INTRAMUSCULAR | Status: AC
  Administered 2019-11-29: 150 mg via INTRAMUSCULAR

## 2019-11-29 NOTE — Progress Notes (Signed)
Patient is here for Depo Provera Injection Patient is within Depo Provera Calender Limits yes Next Depo Due between: 11/16-11/30 Last OV: 09/07/19 Dr. Quincy Simmonds AEX Scheduled: none scheduled   Patient is aware when next depo is due  Pt tolerated Injection well in [place].  Routed to provider for review, encounter closed.

## 2019-12-06 ENCOUNTER — Telehealth (HOSPITAL_COMMUNITY): Payer: Self-pay | Admitting: *Deleted

## 2019-12-06 DIAGNOSIS — F900 Attention-deficit hyperactivity disorder, predominantly inattentive type: Secondary | ICD-10-CM

## 2019-12-06 MED ORDER — LISDEXAMFETAMINE DIMESYLATE 40 MG PO CAPS: 40.0000 mg | ORAL_CAPSULE | Freq: Every day | ORAL | 0 refills | Status: DC

## 2019-12-06 NOTE — Telephone Encounter (Signed)
Done

## 2019-12-06 NOTE — Telephone Encounter (Signed)
Patient called and requested refill of Vyvance be sent to [facility]. Her next appt is 02/03/20.  Please review.

## 2020-01-02 ENCOUNTER — Telehealth: Payer: Self-pay | Admitting: *Deleted

## 2020-01-02 NOTE — Telephone Encounter (Signed)
[facility] 20mg  cap 1 po BID # 60

## 2020-01-05 ENCOUNTER — Telehealth (HOSPITAL_COMMUNITY): Payer: Self-pay | Admitting: *Deleted

## 2020-01-05 DIAGNOSIS — F900 Attention-deficit hyperactivity disorder, predominantly inattentive type: Secondary | ICD-10-CM

## 2020-01-05 MED ORDER — LISDEXAMFETAMINE DIMESYLATE 40 MG PO CAPS: 40.0000 mg | ORAL_CAPSULE | Freq: Every day | ORAL | 0 refills | Status: DC

## 2020-01-05 NOTE — Telephone Encounter (Signed)
Pt called requesting a refill of the VyVanse. Pt has an appointment 01/26/20. Please review.

## 2020-01-05 NOTE — Telephone Encounter (Signed)
Done

## 2020-01-10 ENCOUNTER — Other Ambulatory Visit: Payer: Self-pay | Admitting: Gastroenterology

## 2020-01-10 DIAGNOSIS — K219 Gastro-esophageal reflux disease without esophagitis: Secondary | ICD-10-CM

## 2020-01-10 MED ORDER — OMEPRAZOLE 20 MG PO CPDR: DELAYED_RELEASE_CAPSULE | ORAL | 11 refills | Status: DC

## 2020-01-10 NOTE — Telephone Encounter (Signed)
Received 2nd request for refill.

## 2020-01-10 NOTE — Telephone Encounter (Signed)
Rx sent 

## 2020-01-16 ENCOUNTER — Telehealth: Payer: Self-pay | Admitting: Internal Medicine

## 2020-01-16 NOTE — Telephone Encounter (Signed)
After review now, WIth cardiac hx/surgery I would recomm that she get the Booster vaccine against COVID

## 2020-01-18 NOTE — Telephone Encounter (Signed)
[name], MD    01/16/20 10:11 PM Note After review now, WIth cardiac hx/surgery I would recomm that she get the Booster vaccine against COVID     _________________________________________________________________________________________________ Durene Cal reply to patient.

## 2020-01-26 ENCOUNTER — Telehealth (HOSPITAL_COMMUNITY): Payer: 59 | Admitting: Psychiatry

## 2020-01-27 ENCOUNTER — Other Ambulatory Visit (HOSPITAL_COMMUNITY): Payer: Self-pay | Admitting: Psychiatry

## 2020-01-27 DIAGNOSIS — F411 Generalized anxiety disorder: Secondary | ICD-10-CM

## 2020-02-03 ENCOUNTER — Telehealth (HOSPITAL_COMMUNITY): Payer: Self-pay | Admitting: *Deleted

## 2020-02-03 ENCOUNTER — Telehealth (HOSPITAL_COMMUNITY): Payer: Medicaid Other | Admitting: Psychiatry

## 2020-02-03 DIAGNOSIS — F900 Attention-deficit hyperactivity disorder, predominantly inattentive type: Secondary | ICD-10-CM

## 2020-02-03 MED ORDER — LISDEXAMFETAMINE DIMESYLATE 40 MG PO CAPS: 40.0000 mg | ORAL_CAPSULE | Freq: Every day | ORAL | 0 refills | Status: DC

## 2020-02-03 NOTE — Telephone Encounter (Signed)
Pt called requesting a refill of the VyVanse 40 mg last filled on 01/05/20. Pt has an upcoming appointment on 02/15/20. Please review.

## 2020-02-07 ENCOUNTER — Other Ambulatory Visit (HOSPITAL_COMMUNITY): Payer: Self-pay | Admitting: *Deleted

## 2020-02-07 DIAGNOSIS — F411 Generalized anxiety disorder: Secondary | ICD-10-CM

## 2020-02-07 MED ORDER — HYDROXYZINE HCL 25 MG PO TABS: 25.0000 mg | ORAL_TABLET | Freq: Every day | ORAL | 0 refills | Status: DC | PRN

## 2020-02-15 ENCOUNTER — Other Ambulatory Visit: Payer: Self-pay

## 2020-02-15 ENCOUNTER — Telehealth (INDEPENDENT_AMBULATORY_CARE_PROVIDER_SITE_OTHER): Payer: 59 | Admitting: Psychiatry

## 2020-02-15 ENCOUNTER — Encounter (HOSPITAL_COMMUNITY): Payer: Self-pay | Admitting: Psychiatry

## 2020-02-15 DIAGNOSIS — F3131 Bipolar disorder, current episode depressed, mild: Secondary | ICD-10-CM

## 2020-02-15 DIAGNOSIS — F411 Generalized anxiety disorder: Secondary | ICD-10-CM

## 2020-02-15 DIAGNOSIS — F900 Attention-deficit hyperactivity disorder, predominantly inattentive type: Secondary | ICD-10-CM

## 2020-02-15 MED ORDER — ARIPIPRAZOLE 15 MG PO TABS: 15.0000 mg | ORAL_TABLET | Freq: Every day | ORAL | 2 refills | Status: DC

## 2020-02-15 MED ORDER — FLUOXETINE HCL 40 MG PO CAPS: 40.0000 mg | ORAL_CAPSULE | Freq: Every day | ORAL | 2 refills | Status: DC

## 2020-02-15 MED ORDER — LAMOTRIGINE 200 MG PO TABS: 200.0000 mg | ORAL_TABLET | Freq: Every day | ORAL | 2 refills | Status: DC

## 2020-02-15 MED ORDER — HYDROXYZINE HCL 25 MG PO TABS: 25.0000 mg | ORAL_TABLET | Freq: Every day | ORAL | 0 refills | Status: DC | PRN

## 2020-02-15 MED ORDER — LISDEXAMFETAMINE DIMESYLATE 40 MG PO CAPS: 40.0000 mg | ORAL_CAPSULE | Freq: Every day | ORAL | 0 refills | Status: DC

## 2020-02-15 NOTE — Progress Notes (Signed)
Virtual Visit via Telephone Note  I connected with Kristin Stephens on 02/15/20 at 11:40 AM EST by telephone and verified that I am speaking with the correct person using two identifiers.  Location: Patient: Work Provider: Biomedical scientist   I discussed the limitations, risks, security and privacy concerns of performing an evaluation and management service by telephone and the availability of in person appointments. I also discussed with the patient that there may be a patient responsible charge related to this service. The patient expressed understanding and agreed to proceed.   History of Present Illness: Patient is evaluated by phone session.  She is anxious because her sister who lives in [place] was recently admitted for alcohol problem and currently she is in rehab for 6 weeks.  She is hoping that sister will come back before the Christmas.  Patient admitted that she was upset but also understand the issues related to her sister.  Her daughter lives with her sister in [place].  Patient told that her daughter is doing well and currently patient's mother and patient's brother-in-law taking care of the daughter.  She had not decided to take her daughter back because she feels her daughter is thriving there.  Overall things are going well.  She denies any crying spells, feeling of hopelessness or worthlessness.  She is sleeping good.  She recently passed the exam and got certification for insurance and now she is working full-time for [employer].  She likes her job.  Her attention, focus is good.  She is able to do multitasking.  She denies any mania, psychosis, impulsive behavior or any anger.  She wants to keep her current medication.  She rarely takes trazodone for insomnia otherwise her sleep is good.   Past Psychiatric History:Reviewed. H/Omood swing, depression, anger,overdoseandslashing her wrist.Did IOP in 2016. Inpatientin October 2018.H/O ETOH.Took Adderall,  Ativan,Gabapentin,Wellbutrin but limited response.   Psychiatric Specialty Exam: Physical Exam  Review of Systems  Weight 231 lb (104.8 kg).There is no height or weight on file to calculate BMI.  General Appearance: NA  Eye Contact:  NA  Speech:  Clear and Coherent and Normal Rate  Volume:  Normal  Mood:  Euthymic  Affect:  NA  Thought Process:  Goal Directed  Orientation:  Full (Time, Place, and Person)  Thought Content:  WDL  Suicidal Thoughts:  No  Homicidal Thoughts:  No  Memory:  Immediate;   Good Recent;   Good Remote;   Good  Judgement:  Intact  Insight:  Present  Psychomotor Activity:  NA  Concentration:  Concentration: Good and Attention Span: Good  Recall:  Good  Fund of Knowledge:  Good  Language:  Good  Akathisia:  No  Handed:  Right  AIMS (if indicated):     Assets:  Communication Skills Desire for Improvement Housing Resilience Social Support  ADL's:  Intact  Cognition:  WNL  Sleep:   ok      Assessment and Plan: Bipolar disorder type I.  ADD, inattentive type.  Anxiety.  Patient is a stable on her current medication.  Reassurance given about her sister.  Patient does not want to change the medication however I recommend that she should not take trazodone as needed for sleep since already taking the hydroxyzine as polypharmacy can cause constipation, dry mouth due to anticholinergic effects.  She agreed with the plan.  She has no rash, itching tremors or shakes.  We will continue Abilify 15 mg daily, Lamictal 200 mg daily, hydroxyzine 25 mg  as needed daily, Prozac 40 mg daily and Vyvanse 40 mg morning.  Recommended to call us back if she is any question or any concern.  Follow-up in 3 months.  Follow Up Instructions:    I discussed the assessment and treatment plan with the patient. The patient was provided an opportunity to ask questions and all were answered. The patient agreed with the plan and demonstrated an understanding of the instructions.    The patient was advised to call back or seek an in-person evaluation if the symptoms worsen or if the condition fails to improve as anticipated.  I provided 18 minutes of non-face-to-face time during this encounter.   Kathlee Nations, MD

## 2020-02-28 ENCOUNTER — Ambulatory Visit (INDEPENDENT_AMBULATORY_CARE_PROVIDER_SITE_OTHER): Payer: Medicaid Other

## 2020-02-28 ENCOUNTER — Other Ambulatory Visit: Payer: Self-pay

## 2020-02-28 VITALS — BP 120/72 | HR 76 | Resp 16 | Ht 65.0 in | Wt 227.0 lb

## 2020-02-28 DIAGNOSIS — Z3042 Encounter for surveillance of injectable contraceptive: Secondary | ICD-10-CM

## 2020-02-28 MED ORDER — MEDROXYPROGESTERONE ACETATE 150 MG/ML IM SUSP
150.0000 mg | Freq: Once | INTRAMUSCULAR | Status: AC
  Administered 2020-02-28: 150 mg via INTRAMUSCULAR

## 2020-02-28 NOTE — Progress Notes (Signed)
Patient is here for Depo Provera Injection Patient is within Depo Provera Calender Limits yes Next Depo Due between: 2/15-3/1 Last OV: 09/07/19 Dr. Quincy Simmonds AEX Scheduled: none  Patient is aware when next depo is due  Pt tolerated Injection well in [place].  Routed to provider for review, encounter closed.

## 2020-02-28 NOTE — Progress Notes (Signed)
Patient ID: Kristin Stephens, female   DOB: [birth year], 34 y.o.   MRN: [id number]   Patient needs an annual exam with me for June 2022.

## 2020-02-29 ENCOUNTER — Emergency Department (HOSPITAL_COMMUNITY)
Admission: EM | Admit: 2020-02-29 | Discharge: 2020-02-29 | Disposition: A | Discharge status: Home / Self Care | Payer: Self-pay | Attending: Emergency Medicine | Admitting: Emergency Medicine

## 2020-02-29 ENCOUNTER — Other Ambulatory Visit: Payer: Self-pay

## 2020-02-29 ENCOUNTER — Encounter (HOSPITAL_COMMUNITY): Payer: Self-pay

## 2020-02-29 ENCOUNTER — Emergency Department (HOSPITAL_COMMUNITY): Payer: Self-pay

## 2020-02-29 DIAGNOSIS — R112 Nausea with vomiting, unspecified: Secondary | ICD-10-CM

## 2020-02-29 DIAGNOSIS — K219 Gastro-esophageal reflux disease without esophagitis: Secondary | ICD-10-CM | POA: Insufficient documentation

## 2020-02-29 DIAGNOSIS — Z79899 Other long term (current) drug therapy: Secondary | ICD-10-CM | POA: Insufficient documentation

## 2020-02-29 DIAGNOSIS — F17210 Nicotine dependence, cigarettes, uncomplicated: Secondary | ICD-10-CM | POA: Insufficient documentation

## 2020-02-29 DIAGNOSIS — N12 Tubulo-interstitial nephritis, not specified as acute or chronic: Secondary | ICD-10-CM | POA: Insufficient documentation

## 2020-02-29 DIAGNOSIS — Z7951 Long term (current) use of inhaled steroids: Secondary | ICD-10-CM | POA: Insufficient documentation

## 2020-02-29 DIAGNOSIS — N390 Urinary tract infection, site not specified: Secondary | ICD-10-CM | POA: Insufficient documentation

## 2020-02-29 DIAGNOSIS — R319 Hematuria, unspecified: Secondary | ICD-10-CM

## 2020-02-29 DIAGNOSIS — J45909 Unspecified asthma, uncomplicated: Secondary | ICD-10-CM | POA: Insufficient documentation

## 2020-02-29 DIAGNOSIS — I10 Essential (primary) hypertension: Secondary | ICD-10-CM | POA: Insufficient documentation

## 2020-02-29 DIAGNOSIS — N200 Calculus of kidney: Secondary | ICD-10-CM | POA: Insufficient documentation

## 2020-02-29 DIAGNOSIS — M549 Dorsalgia, unspecified: Secondary | ICD-10-CM | POA: Insufficient documentation

## 2020-02-29 LAB — CBC
HCT: 44 % (ref 36.0–46.0)
Hemoglobin: 14.7 g/dL (ref 12.0–15.0)
MCH: 29.8 pg (ref 26.0–34.0)
MCHC: 33.4 g/dL (ref 30.0–36.0)
MCV: 89.1 fL (ref 80.0–100.0)
Platelets: 346 10*3/uL (ref 150–400)
RBC: 4.94 MIL/uL (ref 3.87–5.11)
RDW: 12.9 % (ref 11.5–15.5)
WBC: 11.8 10*3/uL — ABNORMAL HIGH (ref 4.0–10.5)
nRBC: 0 % (ref 0.0–0.2)

## 2020-02-29 LAB — URINALYSIS, ROUTINE W REFLEX MICROSCOPIC
Bilirubin Urine: NEGATIVE
Glucose, UA: NEGATIVE mg/dL
Ketones, ur: NEGATIVE mg/dL
Nitrite: NEGATIVE
Protein, ur: 100 mg/dL — AB
RBC / HPF: >50 RBC/hpf — ABNORMAL HIGH (ref 0–5)
Specific Gravity, Urine: 1.016 (ref 1.005–1.030)
WBC, UA: >50 WBC/hpf — ABNORMAL HIGH (ref 0–5)
pH: 6 (ref 5.0–8.0)

## 2020-02-29 LAB — COMPREHENSIVE METABOLIC PANEL
ALT: 22 U/L (ref 0–44)
ALT: 23 U/L (ref 0–44)
AST: 17 U/L (ref 15–41)
AST: 20 U/L (ref 15–41)
Albumin: 4.2 g/dL (ref 3.5–5.0)
Albumin: 4.3 g/dL (ref 3.5–5.0)
Alkaline Phosphatase: 66 U/L (ref 38–126)
Alkaline Phosphatase: 66 U/L (ref 38–126)
Anion gap: 9 (ref 5–15)
Anion gap: 14 (ref 5–15)
BUN: 9 mg/dL (ref 6–20)
BUN: 13 mg/dL (ref 6–20)
CO2: 24 mmol/L (ref 22–32)
CO2: 20 mmol/L — ABNORMAL LOW (ref 22–32)
Calcium: 9.4 mg/dL (ref 8.9–10.3)
Calcium: 9.2 mg/dL (ref 8.9–10.3)
Chloride: 105 mmol/L (ref 98–111)
Chloride: 106 mmol/L (ref 98–111)
Creatinine, Ser: 0.8 mg/dL (ref 0.44–1.00)
Creatinine, Ser: 0.81 mg/dL (ref 0.44–1.00)
GFR, Estimated: >60 mL/min (ref >60)
GFR, Estimated: >60 mL/min (ref >60)
Glucose, Bld: 112 mg/dL — ABNORMAL HIGH (ref 70–99)
Glucose, Bld: 122 mg/dL — ABNORMAL HIGH (ref 70–99)
Potassium: 3.5 mmol/L (ref 3.5–5.1)
Potassium: 3.5 mmol/L (ref 3.5–5.1)
Sodium: 138 mmol/L (ref 135–145)
Sodium: 140 mmol/L (ref 135–145)
Total Bilirubin: 0.8 mg/dL (ref 0.3–1.2)
Total Bilirubin: 0.9 mg/dL (ref 0.3–1.2)
Total Protein: 7.2 g/dL (ref 6.5–8.1)
Total Protein: 7.2 g/dL (ref 6.5–8.1)

## 2020-02-29 LAB — CBC WITH DIFFERENTIAL/PLATELET
Abs Immature Granulocytes: 0.02 10*3/uL (ref 0.00–0.07)
Basophils Absolute: 0.1 10*3/uL (ref 0.0–0.1)
Basophils Relative: 1 %
Eosinophils Absolute: 0 10*3/uL (ref 0.0–0.5)
Eosinophils Relative: 0 %
HCT: 40.6 % (ref 36.0–46.0)
Hemoglobin: 13.7 g/dL (ref 12.0–15.0)
Immature Granulocytes: 0 %
Lymphocytes Relative: 13 %
Lymphs Abs: 1.1 10*3/uL (ref 0.7–4.0)
MCH: 29.7 pg (ref 26.0–34.0)
MCHC: 33.7 g/dL (ref 30.0–36.0)
MCV: 88.1 fL (ref 80.0–100.0)
Monocytes Absolute: 0.7 10*3/uL (ref 0.1–1.0)
Monocytes Relative: 8 %
Neutro Abs: 6.3 10*3/uL (ref 1.7–7.7)
Neutrophils Relative %: 78 %
Platelets: 328 10*3/uL (ref 150–400)
RBC: 4.61 MIL/uL (ref 3.87–5.11)
RDW: 12.9 % (ref 11.5–15.5)
WBC: 8.1 10*3/uL (ref 4.0–10.5)
nRBC: 0 % (ref 0.0–0.2)

## 2020-02-29 LAB — LIPASE, BLOOD
Lipase: 25 U/L (ref 11–51)
Lipase: 25 U/L (ref 11–51)

## 2020-02-29 LAB — PREGNANCY, URINE: Preg Test, Ur: NEGATIVE

## 2020-02-29 LAB — I-STAT BETA HCG BLOOD, ED (MC, WL, AP ONLY): I-stat hCG, quantitative: <5 m[IU]/mL (ref <5)

## 2020-02-29 MED ORDER — ONDANSETRON 4 MG PO TBDP
4.0000 mg | ORAL_TABLET | Freq: Once | ORAL | Status: AC | PRN
  Administered 2020-02-29: 4 mg via ORAL
  Filled 2020-02-29: qty 1

## 2020-02-29 MED ORDER — CEPHALEXIN 500 MG PO CAPS: 500.0000 mg | ORAL_CAPSULE | Freq: Three times a day (TID) | ORAL | 0 refills | Status: DC

## 2020-02-29 MED ORDER — ONDANSETRON HCL 4 MG PO TABS: 4.0000 mg | ORAL_TABLET | Freq: Four times a day (QID) | ORAL | 0 refills | Status: DC | PRN

## 2020-02-29 MED ORDER — CEPHALEXIN 500 MG PO CAPS
500.0000 mg | ORAL_CAPSULE | Freq: Once | ORAL | Status: AC
  Administered 2020-02-29: 500 mg via ORAL
  Filled 2020-02-29: qty 1

## 2020-02-29 MED ORDER — LACTATED RINGERS IV BOLUS
1000.0000 mL | Freq: Once | INTRAVENOUS | Status: AC
  Administered 2020-02-29: 1000 mL via INTRAVENOUS

## 2020-02-29 MED ORDER — ONDANSETRON HCL 4 MG/2ML IJ SOLN
4.0000 mg | Freq: Once | INTRAMUSCULAR | Status: AC
  Administered 2020-02-29: 4 mg via INTRAVENOUS
  Filled 2020-02-29: qty 2

## 2020-02-29 MED ORDER — KETOROLAC TROMETHAMINE 30 MG/ML IJ SOLN
30.0000 mg | Freq: Once | INTRAMUSCULAR | Status: AC
  Administered 2020-02-29: 30 mg via INTRAVENOUS
  Filled 2020-02-29: qty 1

## 2020-02-29 NOTE — ED Provider Notes (Signed)
[facility] DEPT Provider Note   CSN: [id number] Arrival date & time: 02/29/20  0139   History Chief Complaint  Patient presents with  . Abdominal Pain    Kristin Stephens is a 34 y.o. female.  The history is provided by the patient.  Abdominal Pain She has history of hypertension, coarctation of the aorta and comes in with urinary symptoms and back pain.  She started having back pain 4 days ago and started having pressure when she urinates 3 days ago.  There has been associated urinary urgency, frequency, tenesmus.  She denies fever, chills, sweats.  She went to an urgent care center today and was diagnosed with urinary tract infection was given a prescription for nitrofurantoin, but the prescription has not been filled yet.  This evening, pain got worse and she started having nausea and vomited several times.  She rates pain at 8/10.  Pain seems to be in the middle of her back, but she is unable to find a comfortable position.  She has not taken any medication for it.  She did have a negative pregnancy test at the urgent care center today.  Past Medical History:  Diagnosis Date  . Abnormal Pap smear of cervix    age 79 with hx of cryotherapy  . ADHD (attention deficit hyperactivity disorder)   . Anxiety   . Asthma    daily and prn inhalers  . Bipolar affective disorder ([place])   . Dental crowns present   . Depression   . Deviated nasal septum 05/2012  . Eczema    right leg  . Esophageal reflux   . Fibromyalgia   . Heart murmur   . Hypertension   . IBS (irritable bowel syndrome)   . Migraine headache    with aura  . Nasal turbinate hypertrophy 05/2012   bilateral  . OCD (obsessive compulsive disorder)   . STD (sexually transmitted disease)    Hx of abnormal pap--age 17   . 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 TMJ syndrome     Patient Active Problem List   Diagnosis Date Noted  . Intentional overdose of drug in tablet form  ([place]) 01/26/2017  . Bipolar 1 disorder, depressed, severe ([place]) 01/26/2017  . Bipolar 2 disorder ([place]) 05/16/2014  . Internal hemorrhoids with complication 82/50/5397  . Vasomotor rhinitis 04/15/2013  . Gastritis without bleeding 03/10/2013  . Anal fissure 03/10/2013  . Obsessive compulsive disorder 03/07/2013  . Fibromyalgia 02/06/2013  . Detachment of glenoid labrum 01/13/2013  . Difficulty in walking(719.7) 02/18/2012  . Seasonal affective disorder ([place]) 11/25/2011  . Asthma, intrinsic 08/28/2010  . ATTENTION DEFICIT DISORDER, ADULT 01/19/2008  . RHINITIS MEDICAMENTOSA 12/08/2007  . ALLERGIC RHINITIS 12/08/2007  . IBS 12/08/2007  . ECZEMA 12/08/2007  . 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 Migraine headache 04/24/2007  . Essential hypertension 04/24/2007  . ASVD 04/24/2007  . GERD 04/24/2007  . VSD 04/24/2007  . 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 ASD AND VSD REPAIR  1989  . CARDIAC CATHETERIZATION  06/18/2006  . CESAREAN SECTION  04/12/2009  . COARCTATION OF AORTA REPAIR  1989  . COLONOSCOPY N/A 05/20/2013   Procedure: COLONOSCOPY;  Surgeon: Danie Binder, MD;  Location: [facility];  Service: Endoscopy;  Laterality: N/A;  115-moved to [place] notified pt  . ESOPHAGOGASTRODUODENOSCOPY  04/04/2008     Normal esophagus/Mild patchy erythema in the  antrum/ Normal duodenal bulb, normal small bowel biopsy  . FLEXIBLE SIGMOIDOSCOPY  04/04/2008     Small internal hemorrhoids (poor bowel prep)  . HEMORRHOID BANDING N/A 05/20/2013   Procedure: HEMORRHOID BANDING;  Surgeon: Danie Binder, MD;  Location: [facility];  Service: Endoscopy;  Laterality: N/A;  . NASAL SEPTOPLASTY W/ TURBINOPLASTY Bilateral 05/31/2012   Procedure: NASAL SEPTOPLASTY WITH TURBINATE REDUCTION;  Surgeon: Ascencion Dike, MD;  Location: [facility];  Service: ENT;  Laterality: Bilateral;     OB History    Gravida  1   Para  1    Term  1   Preterm      AB      Living  1     SAB      TAB      Ectopic      Multiple      Live Births              Family History  Problem Relation Age of Onset  . Hyperlipidemia Mother   . Hypertension Mother   . Depression Mother   . Physical abuse Mother   . Sexual abuse Mother        possibly  . Sleep apnea Mother   . Stroke Mother   . Emphysema Paternal Grandmother   . Heart disease Paternal Grandmother   . Bipolar disorder Paternal Grandmother   . Dementia Paternal Grandmother   . Heart disease Maternal Grandmother   . Dementia Maternal Grandmother   . Stroke Maternal Grandfather   . Prostate cancer Maternal Grandfather   . Thyroid disease Maternal Grandfather   . Personality disorder Father   . Bipolar disorder Father   . Alcohol abuse Father   . ADD / ADHD Sister   . Anxiety disorder Brother   . ADD / ADHD Brother   . Seizures Daughter   . Drug abuse Neg Hx   . OCD Neg Hx   . Paranoid behavior Neg Hx   . Schizophrenia Neg Hx     Social History   Tobacco Use  . Smoking status: Current Every Day Smoker    Packs/day: 0.50    Years: 10.00    Pack years: 5.00    Types: Cigarettes    Start date: 02/27/2002  . Smokeless tobacco: Never Used  . Tobacco comment: has cut back to less than 1/2 a pack and trying to quit  Vaping Use  . Vaping Use: Never used  Substance Use Topics  . 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 Drug use: No    Home Medications Prior to Admission medications   Medication Sig Start Date End Date Taking? Authorizing Provider  acetaminophen (TYLENOL) 500 MG tablet Take 1,000-2,000 mg by mouth every 6 (six) hours as needed for mild pain, moderate pain, fever or headache.    [provider]  albuterol (PROVENTIL HFA;VENTOLIN HFA) 108 (90 Base) MCG/ACT inhaler Inhale 1-2 puffs into the lungs every 6 (six) hours as needed for wheezing or  shortness of breath.    [provider]  ARIPiprazole (ABILIFY) 15 MG tablet Take 1 tablet (15 mg total) by mouth daily. 02/15/20   Arfeen, Arlyce Harman, MD  famotidine (PEPCID) 20 MG tablet Take 1 tablet (20 mg total) by mouth 2 (two) times daily. 12/26/18   Kirichenko, Tatyana, PA-C  FLUoxetine (PROZAC) 40 MG capsule Take 1 capsule (  40 mg total) by mouth daily. 02/15/20   Arfeen, Arlyce Harman, MD  fluticasone (FLONASE) 50 MCG/ACT nasal spray USE 2 SPRAYS IN EACH NOSTRIL EVERY DAY 09/21/17   Mikey Kirschner, MD  HYDROCORTISONE EX Apply 1 application topically as needed (ECZEMA).    [provider]  hydrOXYzine (ATARAX/VISTARIL) 25 MG tablet Take 1 tablet (25 mg total) by mouth daily as needed for anxiety. 02/15/20   Arfeen, Arlyce Harman, MD  lamoTRIgine (LAMICTAL) 200 MG tablet Take 1 tablet (200 mg total) by mouth at bedtime. 02/15/20   Arfeen, Arlyce Harman, MD  lisdexamfetamine (VYVANSE) 40 MG capsule Take 1 capsule (40 mg total) by mouth daily. 02/15/20   Arfeen, Arlyce Harman, MD  loratadine (CLARITIN) 10 MG tablet Take 1 tablet by mouth as needed.    [provider]  metoprolol succinate (TOPROL XL) 25 MG 24 hr tablet Take 1 tablet (25 mg total) by mouth daily. 09/26/19   [name], MD  omeprazole (PRILOSEC) 20 MG capsule 1 PO 30 mins prior to breakfast and supper 01/10/20   Erenest Rasher, PA-C  traZODone (DESYREL) 50 MG tablet Take 1 tablet (50 mg total) by mouth at bedtime as needed for sleep. 11/03/19   Arfeen, Arlyce Harman, MD  promethazine (PHENERGAN) 25 MG tablet Take 1 tablet (25 mg total) by mouth every 6 (six) hours as needed for nausea or vomiting. 02/28/19 05/20/19  McDonald, Mia A, PA-C    Allergies    Sulfonamide derivatives, Coconut flavor, and Coconut oil  Review of Systems   Review of Systems  Gastrointestinal: Positive for abdominal pain.  All other systems reviewed and are negative.   Physical Exam Updated Vital Signs BP (!) 151/103 (BP Location: Right Arm)   Pulse 79    Temp 97.9 F (36.6 C) (Oral)   Resp 16   SpO2 95%   Physical Exam Vitals and nursing note reviewed.   34 year old female, resting comfortably and in no acute distress. Vital signs are significant for elevated blood pressure. Oxygen saturation is 95%, which is normal. Head is normocephalic and atraumatic. PERRLA, EOMI. Oropharynx is clear. Neck is nontender and supple without adenopathy or JVD. Back is nontender in the midline.  There is moderate left CVA tenderness. Lungs are clear without rales, wheezes, or rhonchi. Chest is nontender. Heart has regular rate and rhythm without murmur. Abdomen is soft, flat, with mild to moderate tenderness in the left mid and lower lower abdomen.  There is no rebound or guarding.  There are no masses or hepatosplenomegaly and peristalsis is hypoactive. Extremities have no cyanosis or edema, full range of motion is present. Skin is warm and dry without rash. Neurologic: Mental status is normal, cranial nerves are intact, there are no motor or sensory deficits.  ED Results / Procedures / Treatments   Labs (all labs ordered are listed, but only abnormal results are displayed) Labs Reviewed  COMPREHENSIVE METABOLIC PANEL - Abnormal; Notable for the following components:      Result Value   Glucose, Bld 112 (*)    All other components within normal limits  URINALYSIS, ROUTINE W REFLEX MICROSCOPIC - Abnormal; Notable for the following components:   Color, Urine RED (*)    APPearance CLOUDY (*)    Hgb urine dipstick LARGE (*)    Protein, ur 100 (*)    Leukocytes,Ua MODERATE (*)    RBC / HPF >50 (*)    WBC, UA >50 (*)    Bacteria, UA RARE (*)  All other components within normal limits  URINE CULTURE  LIPASE, BLOOD  CBC WITH DIFFERENTIAL/PLATELET  PREGNANCY, URINE    Radiology CT Renal Stone Study  Result Date: 02/29/2020 CLINICAL DATA:  Flank pain, urinary tract infection, nausea, vomiting EXAM: CT ABDOMEN AND PELVIS WITHOUT CONTRAST  TECHNIQUE: Multidetector CT imaging of the abdomen and pelvis was performed following the standard protocol without IV contrast. COMPARISON:  MRI abdomen 11/03/2011, CT chest and abdomen 02/05/2007 FINDINGS: Lower chest: The visualized lung bases are clear bilaterally. The visualized heart and pericardium are unremarkable. Hepatobiliary: No focal liver abnormality is seen. No gallstones, gallbladder wall thickening, or biliary dilatation. Pancreas: Unremarkable Spleen: Unremarkable Adrenals/Urinary Tract: Adrenal glands are unremarkable. The kidneys are normal in size and position. No hydronephrosis. Two punctate 1-2 mm nonobstructing calculi are seen within the right kidney. No urolithiasis. The bladder is decompressed and is unremarkable. Stomach/Bowel: Stomach is within normal limits. Appendix appears normal. No evidence of bowel wall thickening, distention, or inflammatory changes. No free intraperitoneal gas or fluid. Vascular/Lymphatic: The right of ovarian vein is markedly dilated measuring up to 15 mm in greatest dimension and there are multiple right adnexal varices identified. Additionally, a varix is again identified within the right retroperitoneum adjacent to the ovarian vein which appears decreased in size since prior examination. The abdominal vasculature is otherwise unremarkable on this noncontrast examination. No pathologic adenopathy within the abdomen and pelvis. Reproductive: Uterus and bilateral adnexa are unremarkable. Other: Rectum unremarkable Musculoskeletal: No acute bone abnormality. IMPRESSION: Minimal nonobstructing right nephrolithiasis. No hydronephrosis. No urolithiasis. 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 A high-flow arteriovenous fistula is considered unlikely given its appearance on the prior examination (without early filling) and its interval decrease in size. Pelvic venous insufficiency, similarly, with resultant ovarian vein reflux  is also considered unlikely given the appearance of a this on prior examination where no reflux was identified despite adequate opacification of the right renal vein. This may represent a congenital venous varix and is unlikely of clinical significance given its interval decrease in size over the extended interval. Electronically Signed   By: Fidela Salisbury MD   On: 02/29/2020 05:05    Procedures Procedures   Medications Ordered in ED Medications  cephALEXin (KEFLEX) capsule 500 mg (has no administration in time range)  ondansetron (ZOFRAN) injection 4 mg (4 mg Intravenous Given 02/29/20 0320)  lactated ringers bolus 1,000 mL (0 mLs Intravenous Stopped 02/29/20 0439)  ketorolac (TORADOL) 30 MG/ML injection 30 mg (30 mg Intravenous Given 02/29/20 0320)    ED Course  I have reviewed the triage vital signs and the nursing notes.  Pertinent labs & imaging results that were available during my care of the patient were reviewed by me and considered in my medical decision making (see chart for details).  MDM Rules/Calculators/A&P Flank pain, urinary difficulty, nausea and vomiting.  Symptom complex is concerning for possible urolithiasis versus pyelonephritis.  Given absence of fever, urolithiasis felt to be more likely.  Will send for urinalysis and renal stone protocol CT scan.  In the meantime, she is given IV fluids, ondansetron, ketorolac, morphine.  Old records are reviewed, and she has no relevant past visits.  Urine is significant for RBCs and WBCs with WBC clumps suggestive of UTI. Remainder of labs are unremarkable. Renal stone protocol CT scan shows few, small right renal calculi but no ureteral calculi and no hydronephrosis. Symptoms appear to be UTI with probable early pyelonephritis. She feels much better after above-noted  treatment. She given a dose of cephalexin, and is discharged with prescriptions for cephalexin and ondansetron, told to use over-the-counter NSAIDs and acetaminophen as  needed for pain. Return precautions discussed.  Final Clinical Impression(s) / ED Diagnoses Final diagnoses:  Urinary tract infection with hematuria, site unspecified  Non-intractable vomiting with nausea, unspecified vomiting type  Nephrolithiasis    Rx / DC Orders ED Discharge Orders         Ordered    cephALEXin (KEFLEX) 500 MG capsule  3 times daily        02/29/20 0538    ondansetron (ZOFRAN) 4 MG tablet  Every 6 hours PRN        02/29/20 6725           Delora Fuel, MD 50[birth year]01/[birth year] [phone]

## 2020-02-29 NOTE — Discharge Instructions (Addendum)
Take ibuprofen or naproxen as needed for pain. You may add acetaminophen as needed for additional pain relief.  Return if symptoms are getting worse, or if you are not seeing any improvement over the next two days.

## 2020-02-29 NOTE — ED Triage Notes (Signed)
Patient arrived stating that she was seen at urgent care today and given an antibiotic for UTI symptoms that started on Friday. States tonight she began having abdominal pain, NV.

## 2020-02-29 NOTE — ED Triage Notes (Signed)
Pt sts being seen earlier today for UTI. Pt not able to  Hold Reglan or medications down. C/o vomiting.

## 2020-03-01 ENCOUNTER — Emergency Department (HOSPITAL_COMMUNITY)
Admission: EM | Admit: 2020-03-01 | Discharge: 2020-03-01 | Disposition: A | Discharge status: Home / Self Care | Payer: Medicaid Other | Attending: Emergency Medicine | Admitting: Emergency Medicine

## 2020-03-01 DIAGNOSIS — N12 Tubulo-interstitial nephritis, not specified as acute or chronic: Secondary | ICD-10-CM

## 2020-03-01 DIAGNOSIS — R112 Nausea with vomiting, unspecified: Secondary | ICD-10-CM

## 2020-03-01 LAB — URINALYSIS, ROUTINE W REFLEX MICROSCOPIC
Bilirubin Urine: NEGATIVE
Glucose, UA: NEGATIVE mg/dL
Hgb urine dipstick: NEGATIVE
Ketones, ur: 80 mg/dL — AB
Leukocytes,Ua: NEGATIVE
Nitrite: NEGATIVE
Protein, ur: 30 mg/dL — AB
Specific Gravity, Urine: 1.03 (ref 1.005–1.030)
pH: 6 (ref 5.0–8.0)

## 2020-03-01 MED ORDER — DICYCLOMINE HCL 20 MG PO TABS: 20.0000 mg | ORAL_TABLET | Freq: Two times a day (BID) | ORAL | 0 refills | Status: DC | PRN

## 2020-03-01 MED ORDER — PROMETHAZINE HCL 25 MG RE SUPP
25.0000 mg | Freq: Four times a day (QID) | RECTAL | Status: DC | PRN
  Filled 2020-03-01 (×2): qty 1

## 2020-03-01 MED ORDER — PROMETHAZINE HCL 25 MG/ML IJ SOLN
25.0000 mg | Freq: Once | INTRAMUSCULAR | Status: AC
  Administered 2020-03-01: 25 mg via INTRAVENOUS
  Filled 2020-03-01: qty 1

## 2020-03-01 MED ORDER — SODIUM CHLORIDE 0.9 % IV SOLN
2.0000 g | Freq: Once | INTRAVENOUS | Status: AC
  Administered 2020-03-01: 2 g via INTRAVENOUS
  Filled 2020-03-01: qty 20

## 2020-03-01 MED ORDER — DICYCLOMINE HCL 10 MG PO CAPS
10.0000 mg | ORAL_CAPSULE | Freq: Once | ORAL | Status: AC
  Administered 2020-03-01: 10 mg via ORAL
  Filled 2020-03-01: qty 1

## 2020-03-01 MED ORDER — ACETAMINOPHEN 500 MG PO TABS
1000.0000 mg | ORAL_TABLET | Freq: Once | ORAL | Status: AC
  Administered 2020-03-01: 1000 mg via ORAL
  Filled 2020-03-01: qty 2

## 2020-03-01 MED ORDER — PROMETHAZINE HCL 25 MG PO TABS: 25.0000 mg | ORAL_TABLET | Freq: Four times a day (QID) | ORAL | 0 refills | Status: DC | PRN

## 2020-03-01 MED ORDER — PROMETHAZINE HCL 25 MG RE SUPP: 25.0000 mg | Freq: Four times a day (QID) | RECTAL | 0 refills | Status: DC | PRN

## 2020-03-01 MED ORDER — FENTANYL CITRATE (PF) 100 MCG/2ML IJ SOLN
50.0000 ug | Freq: Once | INTRAMUSCULAR | Status: AC
  Administered 2020-03-01: 50 ug via INTRAVENOUS
  Filled 2020-03-01: qty 2

## 2020-03-01 MED ORDER — LACTATED RINGERS IV BOLUS
1000.0000 mL | Freq: Once | INTRAVENOUS | Status: AC
  Administered 2020-03-01: 1000 mL via INTRAVENOUS

## 2020-03-01 MED ORDER — SODIUM CHLORIDE 0.9 % IV BOLUS
1000.0000 mL | Freq: Once | INTRAVENOUS | Status: AC
  Administered 2020-03-01: 1000 mL via INTRAVENOUS

## 2020-03-01 MED ORDER — KETOROLAC TROMETHAMINE 30 MG/ML IJ SOLN
15.0000 mg | Freq: Once | INTRAMUSCULAR | Status: AC
  Administered 2020-03-01: 15 mg via INTRAVENOUS
  Filled 2020-03-01: qty 1

## 2020-03-01 NOTE — ED Provider Notes (Signed)
Care assumed from Dr. Dayna Barker.  At time of transfer care, patient is awaiting finishing her fluids, showing that she can urinate successfully, and p.o. challenge.  If she passes these tests, she'll be discharged home with new prescription for nausea medication and PCP follow-up.  9:49 AM Patient is reassessed and start having more vomiting.  She did have some dark appearance to the emesis, likely bleeding from the Mallory-Weiss tears previous team suspected.  We will give her another dose of nausea medicine and more fluids.  Anticipate discharge if she passes p.o. challenge.  2:52 PM Patient monitored for several hours.  She still had some nausea but vomiting had improved.  Her vital signs were reassuring.  Patient given a dose of Phenergan suppository and will follow up with a GI doctor.  Patient due to plan of care and was discharged in good condition with improved symptoms.   Clinical Impression: 1. Non-intractable vomiting with nausea, unspecified vomiting type   2. Pyelonephritis     Disposition: Discharge  Condition: Good  I have discussed the results, Dx and Tx plan with the pt(& family if present). He/she/they expressed understanding and agree(s) with the plan. Discharge instructions discussed at great length. Strict return precautions discussed and pt &/or family have verbalized understanding of the instructions. No further questions at time of discharge.    New Prescriptions   DICYCLOMINE (BENTYL) 20 MG TABLET    Take 1 tablet (20 mg total) by mouth 2 (two) times daily as needed for spasms (abdominal cramping).   PROMETHAZINE (PHENERGAN) 25 MG SUPPOSITORY    Place 1 suppository (25 mg total) rectally every 6 (six) hours as needed for nausea or vomiting.   PROMETHAZINE (PHENERGAN) 25 MG TABLET    Take 1 tablet (25 mg total) by mouth every 6 (six) hours as needed for nausea or vomiting.    Follow Up: [facility] DEPT [place] [id number] [place] [id number] [phone]  If symptoms worsen        Leitha Hyppolite, Gwenyth Allegra, MD 03/01/20 1601

## 2020-03-01 NOTE — ED Provider Notes (Signed)
[facility] DEPT Provider Note   CSN: [id number] Arrival date & time: 02/29/20  2040     History Chief Complaint  Patient presents with  . Vomiting    Kristin Stephens is a 34 y.o. female.  Seen here yesterday diagnosed with pyelonephritis and sent home with antiemetics and antibiotics.  Patient apparently did well initially but then started having incessant vomiting.  She states she was discharged with Reglan but the prescription is for Zofran so not sure which she actually has.  Either way she is unable to tolerate p.o. has vomited multiple times in the last 12 to 18 hours.  Causing worsening abdominal pain crampy in nature in the upper abdomen.  No alcohol, drugs or tobacco recently.  No history of severe illness similar to this.  Patient states that he is not a fever.  She notes that she had some darker than normal stools and may be some blood streaking in her vomit.  No bright red blood per rectum.  The history is provided by the patient.  Emesis      Past Medical History:  Diagnosis Date  . Abnormal Pap smear of cervix    age 54 with hx of cryotherapy  . ADHD (attention deficit hyperactivity disorder)   . Anxiety   . Asthma    daily and prn inhalers  . Bipolar affective disorder ([place])   . Dental crowns present   . Depression   . Deviated nasal septum 05/2012  . Eczema    right leg  . Esophageal reflux   . Fibromyalgia   . Heart murmur   . Hypertension   . IBS (irritable bowel syndrome)   . Migraine headache    with aura  . Nasal turbinate hypertrophy 05/2012   bilateral  . OCD (obsessive compulsive disorder)   . STD (sexually transmitted disease)    Hx of abnormal pap--age 75   . 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 TMJ syndrome     Patient Active Problem List   Diagnosis Date Noted  . Intentional overdose of drug in tablet form ([place]) 01/26/2017  . Bipolar 1 disorder, depressed, severe ([place]) 01/26/2017  . Bipolar  2 disorder ([place]) 05/16/2014  . Internal hemorrhoids with complication 43/32/9518  . Vasomotor rhinitis 04/15/2013  . Gastritis without bleeding 03/10/2013  . Anal fissure 03/10/2013  . Obsessive compulsive disorder 03/07/2013  . Fibromyalgia 02/06/2013  . Detachment of glenoid labrum 01/13/2013  . Difficulty in walking(719.7) 02/18/2012  . Seasonal affective disorder ([place]) 11/25/2011  . Asthma, intrinsic 08/28/2010  . ATTENTION DEFICIT DISORDER, ADULT 01/19/2008  . RHINITIS MEDICAMENTOSA 12/08/2007  . ALLERGIC RHINITIS 12/08/2007  . IBS 12/08/2007  . ECZEMA 12/08/2007  . 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 Migraine headache 04/24/2007  . Essential hypertension 04/24/2007  . ASVD 04/24/2007  . GERD 04/24/2007  . VSD 04/24/2007  . 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 ASD AND VSD REPAIR  1989  . CARDIAC CATHETERIZATION  06/18/2006  . CESAREAN SECTION  04/12/2009  . COARCTATION OF AORTA REPAIR  1989  . COLONOSCOPY N/A 05/20/2013   Procedure: COLONOSCOPY;  Surgeon: Danie Binder, MD;  [facility];  Service: Endoscopy;  Laterality: N/A;  115-moved to [place] notified pt  . ESOPHAGOGASTRODUODENOSCOPY  04/04/2008     Normal esophagus/Mild patchy erythema in the antrum/ Normal duodenal bulb, normal small bowel biopsy  . FLEXIBLE  SIGMOIDOSCOPY  04/04/2008     Small internal hemorrhoids (poor bowel prep)  . HEMORRHOID BANDING N/A 05/20/2013   Procedure: HEMORRHOID BANDING;  Surgeon: Danie Binder, MD;  Location: [facility];  Service: Endoscopy;  Laterality: N/A;  . NASAL SEPTOPLASTY W/ TURBINOPLASTY Bilateral 05/31/2012   Procedure: NASAL SEPTOPLASTY WITH TURBINATE REDUCTION;  Surgeon: Ascencion Dike, MD;  Location: [facility];  Service: ENT;  Laterality: Bilateral;     OB History    Gravida  1   Para  1   Term  1   Preterm      AB      Living  1     SAB      TAB      Ectopic       Multiple      Live Births              Family History  Problem Relation Age of Onset  . Hyperlipidemia Mother   . Hypertension Mother   . Depression Mother   . Physical abuse Mother   . Sexual abuse Mother        possibly  . Sleep apnea Mother   . Stroke Mother   . Emphysema Paternal Grandmother   . Heart disease Paternal Grandmother   . Bipolar disorder Paternal Grandmother   . Dementia Paternal Grandmother   . Heart disease Maternal Grandmother   . Dementia Maternal Grandmother   . Stroke Maternal Grandfather   . Prostate cancer Maternal Grandfather   . Thyroid disease Maternal Grandfather   . Personality disorder Father   . Bipolar disorder Father   . Alcohol abuse Father   . ADD / ADHD Sister   . Anxiety disorder Brother   . ADD / ADHD Brother   . Seizures Daughter   . Drug abuse Neg Hx   . OCD Neg Hx   . Paranoid behavior Neg Hx   . Schizophrenia Neg Hx     Social History   Tobacco Use  . Smoking status: Current Every Day Smoker    Packs/day: 0.50    Years: 10.00    Pack years: 5.00    Types: Cigarettes    Start date: 02/27/2002  . Smokeless tobacco: Never Used  . Tobacco comment: has cut back to less than 1/2 a pack and trying to quit  Vaping Use  . Vaping Use: Never used  Substance Use Topics  . 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 Drug use: No    Home Medications Prior to Admission medications   Medication Sig Start Date End Date Taking? Authorizing Provider  acetaminophen (TYLENOL) 500 MG tablet Take 1,000-2,000 mg by mouth every 6 (six) hours as needed for mild pain, moderate pain, fever or headache.    [provider]  albuterol (PROVENTIL HFA;VENTOLIN HFA) 108 (90 Base) MCG/ACT inhaler Inhale 1-2 puffs into the lungs every 6 (six) hours as needed for wheezing or shortness of breath.    [provider]  ARIPiprazole (ABILIFY) 15 MG tablet Take 1  tablet (15 mg total) by mouth daily. 02/15/20   Arfeen, Arlyce Harman, MD  cephALEXin (KEFLEX) 500 MG capsule Take 1 capsule (500 mg total) by mouth 3 (three) times daily. 32/3/55   Delora Fuel, MD  dicyclomine (BENTYL) 20 MG tablet Take 1 tablet (20 mg total) by mouth 2 (two) times daily as needed  for spasms (abdominal cramping). 03/01/20   Uriah Philipson, Corene Cornea, MD  famotidine (PEPCID) 20 MG tablet Take 1 tablet (20 mg total) by mouth 2 (two) times daily. 12/26/18   Kirichenko, Tatyana, PA-C  FLUoxetine (PROZAC) 40 MG capsule Take 1 capsule (40 mg total) by mouth daily. 02/15/20   Arfeen, Arlyce Harman, MD  fluticasone (FLONASE) 50 MCG/ACT nasal spray USE 2 SPRAYS IN EACH NOSTRIL EVERY DAY 09/21/17   Mikey Kirschner, MD  HYDROCORTISONE EX Apply 1 application topically as needed (ECZEMA).    [provider]  hydrOXYzine (ATARAX/VISTARIL) 25 MG tablet Take 1 tablet (25 mg total) by mouth daily as needed for anxiety. 02/15/20   Arfeen, Arlyce Harman, MD  lamoTRIgine (LAMICTAL) 200 MG tablet Take 1 tablet (200 mg total) by mouth at bedtime. 02/15/20   Arfeen, Arlyce Harman, MD  lisdexamfetamine (VYVANSE) 40 MG capsule Take 1 capsule (40 mg total) by mouth daily. 02/15/20   Arfeen, Arlyce Harman, MD  loratadine (CLARITIN) 10 MG tablet Take 1 tablet by mouth as needed.    [provider]  metoprolol succinate (TOPROL XL) 25 MG 24 hr tablet Take 1 tablet (25 mg total) by mouth daily. 09/26/19   [name], MD  omeprazole (PRILOSEC) 20 MG capsule 1 PO 30 mins prior to breakfast and supper 01/10/20   Erenest Rasher, PA-C  ondansetron (ZOFRAN) 4 MG tablet Take 1 tablet (4 mg total) by mouth every 6 (six) hours as needed for nausea or vomiting. 16/1/09   Delora Fuel, MD  promethazine (PHENERGAN) 25 MG suppository Place 1 suppository (25 mg total) rectally every 6 (six) hours as needed for nausea or vomiting. 03/01/20   Auburn Hert, Corene Cornea, MD  promethazine (PHENERGAN) 25 MG tablet Take 1 tablet (25 mg total) by mouth every 6 (six)  hours as needed for nausea or vomiting. 03/01/20   Duaine Radin, Corene Cornea, MD  traZODone (DESYREL) 50 MG tablet Take 1 tablet (50 mg total) by mouth at bedtime as needed for sleep. 11/03/19   Arfeen, Arlyce Harman, MD    Allergies    Sulfonamide derivatives, Coconut flavor, and Coconut oil  Review of Systems   Review of Systems  Gastrointestinal: Positive for vomiting.  All other systems reviewed and are negative.   Physical Exam Updated Vital Signs BP (!) 139/91   Pulse 69   Temp 98 F (36.7 C) (Oral)   Resp 20   Ht 5\' 5"  (1.651 m)   Wt 103 kg   SpO2 100%   BMI 37.77 kg/m   Physical Exam Vitals and nursing note reviewed.  Constitutional:      Appearance: She is well-developed.  HENT:     Head: Normocephalic and atraumatic.     Mouth/Throat:     Mouth: Mucous membranes are moist.     Pharynx: Oropharynx is clear.  Eyes:     Pupils: Pupils are equal, round, and reactive to light.  Cardiovascular:     Rate and Rhythm: Normal rate and regular rhythm.  Pulmonary:     Effort: No respiratory distress.     Breath sounds: No stridor.  Abdominal:     General: Abdomen is flat. There is no distension.     Tenderness: There is abdominal tenderness.  Musculoskeletal:        General: No swelling or tenderness. Normal range of motion.     Cervical back: Normal range of motion.  Skin:    General: Skin is warm and dry.  Neurological:     General:  No focal deficit present.     Mental Status: She is alert.     ED Results / Procedures / Treatments   Labs (all labs ordered are listed, but only abnormal results are displayed) Labs Reviewed  COMPREHENSIVE METABOLIC PANEL - Abnormal; Notable for the following components:      Result Value   CO2 20 (*)    Glucose, Bld 122 (*)    All other components within normal limits  CBC - Abnormal; Notable for the following components:   WBC 11.8 (*)    All other components within normal limits  LIPASE, BLOOD  URINALYSIS, ROUTINE W REFLEX MICROSCOPIC    I-STAT BETA HCG BLOOD, ED ([facility], [facility], [facility] ONLY)    EKG None  Radiology CT Renal Stone Study  Result Date: 02/29/2020 CLINICAL DATA:  Flank pain, urinary tract infection, nausea, vomiting EXAM: CT ABDOMEN AND PELVIS WITHOUT CONTRAST TECHNIQUE: Multidetector CT imaging of the abdomen and pelvis was performed following the standard protocol without IV contrast. COMPARISON:  MRI abdomen 11/03/2011, CT chest and abdomen 02/05/2007 FINDINGS: Lower chest: The visualized lung bases are clear bilaterally. The visualized heart and pericardium are unremarkable. Hepatobiliary: No focal liver abnormality is seen. No gallstones, gallbladder wall thickening, or biliary dilatation. Pancreas: Unremarkable Spleen: Unremarkable Adrenals/Urinary Tract: Adrenal glands are unremarkable. The kidneys are normal in size and position. No hydronephrosis. Two punctate 1-2 mm nonobstructing calculi are seen within the right kidney. No urolithiasis. The bladder is decompressed and is unremarkable. Stomach/Bowel: Stomach is within normal limits. Appendix appears normal. No evidence of bowel wall thickening, distention, or inflammatory changes. No free intraperitoneal gas or fluid. Vascular/Lymphatic: The right of ovarian vein is markedly dilated measuring up to 15 mm in greatest dimension and there are multiple right adnexal varices identified. Additionally, a varix is again identified within the right retroperitoneum adjacent to the ovarian vein which appears decreased in size since prior examination. The abdominal vasculature is otherwise unremarkable on this noncontrast examination. No pathologic adenopathy within the abdomen and pelvis. Reproductive: Uterus and bilateral adnexa are unremarkable. Other: Rectum unremarkable Musculoskeletal: No acute bone abnormality. IMPRESSION: Minimal nonobstructing right nephrolithiasis. No hydronephrosis. No urolithiasis. 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 A high-flow arteriovenous fistula is considered unlikely given its appearance on the prior examination (without early filling) and its interval decrease in size. Pelvic venous insufficiency, similarly, with resultant ovarian vein reflux is also considered unlikely given the appearance of a this on prior examination where no reflux was identified despite adequate opacification of the right renal vein. This may represent a congenital venous varix and is unlikely of clinical significance given its interval decrease in size over the extended interval. Electronically Signed   By: Fidela Salisbury MD   On: 02/29/2020 05:05    Procedures Procedures (including critical care time)  Medications Ordered in ED Medications  acetaminophen (TYLENOL) tablet 1,000 mg (has no administration in time range)  ketorolac (TORADOL) 30 MG/ML injection 15 mg (has no administration in time range)  fentaNYL (SUBLIMAZE) injection 50 mcg (has no administration in time range)  lactated ringers bolus 1,000 mL (has no administration in time range)  ondansetron (ZOFRAN-ODT) disintegrating tablet 4 mg (4 mg Oral Given 02/29/20 2055)  cefTRIAXone (ROCEPHIN) 2 g in sodium chloride 0.9 % 100 mL IVPB (2 g Intravenous New Bag/Given 03/01/20 0628)  sodium chloride 0.9 % bolus 1,000 mL (1,000 mLs Intravenous New Bag/Given 03/01/20 0623)  promethazine (PHENERGAN) injection 25 mg (25 mg Intravenous  Given 03/01/20 0625)  dicyclomine (BENTYL) capsule 10 mg (10 mg Oral Given 03/01/20 5093)    ED Course  I have reviewed the triage vital signs and the nursing notes.  Pertinent labs & imaging results that were available during my care of the patient were reviewed by me and considered in my medical decision making (see chart for details).    MDM Rules/Calculators/A&P                         Patient with mild abdominal tenderness but likely related to multiple episodes of vomiting.  She also has some dark stools despite having small  Mallory-Weiss tear versus gastritis.  We will going give some proton pump inhibitor along with fluids and antiemetics.  I discussed with her try to get her nausea under control here and then send her home on some Phenergan suppositories rather than the Reglan and Zofran since they do not seem to be working.  She is okay with this plan.  We will going give another dose of IV antibiotics that she is not tolerated any antibiotics at home. If not able to gain control of vomiting, will likely need admitted.  Patient able to stop vomiting.  We will do a p.o. challenge.  She does not make urine likely secondary dehydration but and stopped some crampy abdominal pain so we will give some different pain medications.  We will go and repeat another bolus.  Care transferred pending p.o. challenge and to ensure she makes urine. Otherwise appears stable for discharge currently.  Final Clinical Impression(s) / ED Diagnoses Final diagnoses:  Non-intractable vomiting with nausea, unspecified vomiting type  Pyelonephritis    Rx / DC Orders ED Discharge Orders         Ordered    promethazine (PHENERGAN) 25 MG tablet  Every 6 hours PRN        03/01/20 0740    promethazine (PHENERGAN) 25 MG suppository  Every 6 hours PRN        03/01/20 0740    dicyclomine (BENTYL) 20 MG tablet  2 times daily PRN        03/01/20 0741           Issiah Huffaker, Corene Cornea, MD 03/01/20 [phone]

## 2020-03-01 NOTE — Discharge Instructions (Addendum)
Please follow-up with your primary doctor and a GI doctor.  We suspect that your urinary tract and kidney infection caused the nausea and vomiting.  This vomiting likely led to some of the esophageal irritation and bleeding that you have been seeing.  Your blood counts and vital signs are reassuring here.  We feel you are safe for discharge home.  If symptoms change or worsen or you cannot continue taking your home antibiotic medication, please return to the nearest emergency department.

## 2020-03-02 LAB — URINE CULTURE: Culture: >=100000 — AB

## 2020-03-03 ENCOUNTER — Ambulatory Visit
Admission: EM | Admit: 2020-03-03 | Discharge: 2020-03-03 | Disposition: A | Discharge status: Home / Self Care | Payer: Medicaid Other

## 2020-03-03 ENCOUNTER — Emergency Department (HOSPITAL_COMMUNITY)
Admission: EM | Admit: 2020-03-03 | Discharge: 2020-03-03 | Discharge status: Against Medical Advice | Payer: Medicaid Other

## 2020-03-03 ENCOUNTER — Emergency Department (HOSPITAL_COMMUNITY)
Admission: EM | Admit: 2020-03-03 | Discharge: 2020-03-03 | Disposition: A | Discharge status: Home / Self Care | Payer: Medicaid Other | Attending: Emergency Medicine | Admitting: Emergency Medicine

## 2020-03-03 ENCOUNTER — Telehealth (HOSPITAL_COMMUNITY): Payer: Self-pay | Admitting: Physician Assistant

## 2020-03-03 ENCOUNTER — Other Ambulatory Visit: Payer: Self-pay

## 2020-03-03 ENCOUNTER — Encounter (HOSPITAL_COMMUNITY): Payer: Self-pay | Admitting: *Deleted

## 2020-03-03 DIAGNOSIS — Z5321 Procedure and treatment not carried out due to patient leaving prior to being seen by health care provider: Secondary | ICD-10-CM | POA: Insufficient documentation

## 2020-03-03 DIAGNOSIS — F502 Bulimia nervosa: Secondary | ICD-10-CM

## 2020-03-03 DIAGNOSIS — R112 Nausea with vomiting, unspecified: Secondary | ICD-10-CM | POA: Insufficient documentation

## 2020-03-03 MED ORDER — CIPROFLOXACIN HCL 500 MG PO TABS: 500.0000 mg | ORAL_TABLET | Freq: Two times a day (BID) | ORAL | 0 refills | Status: AC

## 2020-03-03 NOTE — ED Triage Notes (Signed)
Pt states she was seen her Wed due to vomiting, continues to vomit, Phenergan supp are not helping.

## 2020-03-03 NOTE — Telephone Encounter (Signed)
See previous note, Cipro Rx sent to Pharmacy

## 2020-03-03 NOTE — ED Provider Notes (Signed)
Pharmacy discussed culture reports with me.  Patient actually back here in the emergency department [place] for intractable nausea and vomiting despite Zofran and Phenergan suppositories at home.  We will plan on discussing changing Abx once patient roomed as previous ABx, Macrobid, Rocephin and Kelfex is not sensitive per Culture report.  1120: Unfortunately patient left without being seen from the waiting room. Was not evaluated by provider.  I did call and spoke with patient to discuss her urine culture report.  Patient is very tearful.  States she is still having emesis and is unable to keep down her previously prescribed antibiotics or any PO intake. I encouraged patient to return to the emergency department for evaluation as patient states she is been unable to keep down any medications over the last 2 days and she is having worsening flank pain.  Patient states she will "rest" at home and then return to emergency department for reevaluation.  Per Pharmacy patient needs Cipro or Levaquin per culture report      Henderly, Britni A, PA-C 03/03/20 1128    Milton Ferguson, MD 03/04/20 0830

## 2020-03-03 NOTE — Progress Notes (Signed)
ED Antimicrobial Stewardship Positive Culture Follow Up   Kristin Stephens is an 34 y.o. female who presented to [facility] on (Not on file) with a chief complaint of  Chief Complaint  Patient presents with  . Emesis    Recent Results (from the past 720 hour(s))  Urine culture     Status: Abnormal   Collection Time: 02/29/20  3:16 AM   Specimen: Urine, Random  Result Value Ref Range Status   Specimen Description   Final    URINE, RANDOM Performed at [facility] [address], [place], [place] [address]    Special Requests   Final    NONE Performed at [facility], [place] [address], [place], [place] [address]    Culture >=100,000 COLONIES/mL STAPHYLOCOCCUS SAPROPHYTICUS (A)  Final   Report Status 03/02/2020 FINAL  Final   Organism ID, Bacteria STAPHYLOCOCCUS SAPROPHYTICUS (A)  Final      Susceptibility   Staphylococcus saprophyticus - MIC*    CIPROFLOXACIN <=0.5 SENSITIVE Sensitive     GENTAMICIN <=0.5 SENSITIVE Sensitive     NITROFURANTOIN <=16 SENSITIVE Sensitive     OXACILLIN 2 RESISTANT Resistant     TETRACYCLINE <=1 SENSITIVE Sensitive     VANCOMYCIN 1 SENSITIVE Sensitive     TRIMETH/SULFA 20 SENSITIVE Sensitive     CLINDAMYCIN >=8 RESISTANT Resistant     RIFAMPIN <=0.5 SENSITIVE Sensitive     Inducible Clindamycin NEGATIVE Sensitive     * >=100,000 COLONIES/mL STAPHYLOCOCCUS SAPROPHYTICUS   [x]  Treated with keflex, organism resistant to prescribed antimicrobial []  Patient discharged originally without antimicrobial agent and treatment is now indicated  New antibiotic Cipro 500 mg PO BID x 7 days> Pt currently in ED, EDP to treat  ED Provider: B. Henderly, PA-C   Eudelia Bunch, Pharm.D 03/03/2020 10:42 AM

## 2020-03-03 NOTE — ED Triage Notes (Signed)
Patient states she went to the ED and left prior to being seen due to the wait time. Pt has stated that the phenergan suppositories and Zofran are not working. Pt is aox4 and ambulatory.

## 2020-03-05 ENCOUNTER — Emergency Department (HOSPITAL_COMMUNITY)
Admission: EM | Admit: 2020-03-05 | Discharge: 2020-03-05 | Disposition: A | Discharge status: Home / Self Care | Payer: Self-pay | Attending: Emergency Medicine | Admitting: Emergency Medicine

## 2020-03-05 ENCOUNTER — Encounter (HOSPITAL_COMMUNITY): Payer: Self-pay | Admitting: Emergency Medicine

## 2020-03-05 ENCOUNTER — Emergency Department (HOSPITAL_COMMUNITY): Payer: Self-pay

## 2020-03-05 DIAGNOSIS — I10 Essential (primary) hypertension: Secondary | ICD-10-CM | POA: Insufficient documentation

## 2020-03-05 DIAGNOSIS — Z79899 Other long term (current) drug therapy: Secondary | ICD-10-CM | POA: Insufficient documentation

## 2020-03-05 DIAGNOSIS — F17210 Nicotine dependence, cigarettes, uncomplicated: Secondary | ICD-10-CM | POA: Insufficient documentation

## 2020-03-05 DIAGNOSIS — R1031 Right lower quadrant pain: Secondary | ICD-10-CM | POA: Insufficient documentation

## 2020-03-05 DIAGNOSIS — N390 Urinary tract infection, site not specified: Secondary | ICD-10-CM | POA: Insufficient documentation

## 2020-03-05 DIAGNOSIS — R112 Nausea with vomiting, unspecified: Secondary | ICD-10-CM

## 2020-03-05 LAB — COMPREHENSIVE METABOLIC PANEL
ALT: 102 U/L — ABNORMAL HIGH (ref 0–44)
AST: 53 U/L — ABNORMAL HIGH (ref 15–41)
Albumin: 3.9 g/dL (ref 3.5–5.0)
Alkaline Phosphatase: 78 U/L (ref 38–126)
Anion gap: 18 — ABNORMAL HIGH (ref 5–15)
BUN: 10 mg/dL (ref 6–20)
CO2: 19 mmol/L — ABNORMAL LOW (ref 22–32)
Calcium: 9.9 mg/dL (ref 8.9–10.3)
Chloride: 98 mmol/L (ref 98–111)
Creatinine, Ser: 0.79 mg/dL (ref 0.44–1.00)
GFR, Estimated: >60 mL/min (ref >60)
Glucose, Bld: 121 mg/dL — ABNORMAL HIGH (ref 70–99)
Potassium: 3.3 mmol/L — ABNORMAL LOW (ref 3.5–5.1)
Sodium: 135 mmol/L (ref 135–145)
Total Bilirubin: 2.6 mg/dL — ABNORMAL HIGH (ref 0.3–1.2)
Total Protein: 7 g/dL (ref 6.5–8.1)

## 2020-03-05 LAB — CBC
HCT: 50.8 % — ABNORMAL HIGH (ref 36.0–46.0)
Hemoglobin: 16.2 g/dL — ABNORMAL HIGH (ref 12.0–15.0)
MCH: 29.3 pg (ref 26.0–34.0)
MCHC: 31.9 g/dL (ref 30.0–36.0)
MCV: 92 fL (ref 80.0–100.0)
Platelets: 403 10*3/uL — ABNORMAL HIGH (ref 150–400)
RBC: 5.52 MIL/uL — ABNORMAL HIGH (ref 3.87–5.11)
RDW: 12.8 % (ref 11.5–15.5)
WBC: 9.5 10*3/uL (ref 4.0–10.5)
nRBC: 0 % (ref 0.0–0.2)

## 2020-03-05 LAB — URINALYSIS, ROUTINE W REFLEX MICROSCOPIC
Glucose, UA: NEGATIVE mg/dL
Hgb urine dipstick: NEGATIVE
Ketones, ur: 5 mg/dL — AB
Leukocytes,Ua: NEGATIVE
Nitrite: NEGATIVE
Protein, ur: NEGATIVE mg/dL
Specific Gravity, Urine: 1.017 (ref 1.005–1.030)
pH: 7 (ref 5.0–8.0)

## 2020-03-05 LAB — PREGNANCY, URINE: Preg Test, Ur: NEGATIVE

## 2020-03-05 MED ORDER — FENTANYL CITRATE (PF) 100 MCG/2ML IJ SOLN
50.0000 ug | Freq: Once | INTRAMUSCULAR | Status: AC
  Administered 2020-03-05: 50 ug via INTRAVENOUS
  Filled 2020-03-05: qty 2

## 2020-03-05 MED ORDER — SODIUM CHLORIDE 0.9 % IV SOLN
100.0000 mg | Freq: Two times a day (BID) | INTRAVENOUS | Status: DC
  Administered 2020-03-05: 100 mg via INTRAVENOUS
  Filled 2020-03-05 (×2): qty 100

## 2020-03-05 MED ORDER — DOXYCYCLINE HYCLATE 100 MG PO TABS
100.0000 mg | ORAL_TABLET | Freq: Once | ORAL | Status: AC
  Administered 2020-03-05: 100 mg via ORAL
  Filled 2020-03-05: qty 1

## 2020-03-05 MED ORDER — IOHEXOL 300 MG/ML  SOLN
100.0000 mL | Freq: Once | INTRAMUSCULAR | Status: AC | PRN
  Administered 2020-03-05: 100 mL via INTRAVENOUS

## 2020-03-05 MED ORDER — SODIUM CHLORIDE 0.9 % IV BOLUS
1000.0000 mL | Freq: Once | INTRAVENOUS | Status: AC
  Administered 2020-03-05: 1000 mL via INTRAVENOUS

## 2020-03-05 MED ORDER — DROPERIDOL 2.5 MG/ML IJ SOLN
1.2500 mg | Freq: Once | INTRAMUSCULAR | Status: AC
  Administered 2020-03-05: 1.25 mg via INTRAVENOUS
  Filled 2020-03-05: qty 2

## 2020-03-05 MED ORDER — DOXYCYCLINE HYCLATE 100 MG PO CAPS: 100.0000 mg | ORAL_CAPSULE | Freq: Two times a day (BID) | ORAL | 0 refills | Status: AC

## 2020-03-05 MED ORDER — ONDANSETRON HCL 4 MG/2ML IJ SOLN
4.0000 mg | Freq: Once | INTRAMUSCULAR | Status: AC
  Administered 2020-03-05: 4 mg via INTRAVENOUS
  Filled 2020-03-05: qty 2

## 2020-03-05 MED ORDER — ONDANSETRON 4 MG PO TBDP: 4.0000 mg | ORAL_TABLET | Freq: Three times a day (TID) | ORAL | 0 refills | Status: DC | PRN

## 2020-03-05 MED ORDER — SODIUM CHLORIDE 0.9 % IV SOLN: Freq: Once | INTRAVENOUS | Status: AC

## 2020-03-05 NOTE — Discharge Instructions (Signed)
Follow-up with your primary doctor as well as gastroenterology. Take the antibiotic as prescribed. Take the Zofran as needed. Return to ER if you have worsening vomiting, difficulty holding food down, fever or other new concerning symptoms.

## 2020-03-05 NOTE — ED Provider Notes (Signed)
[facility] EMERGENCY DEPARTMENT Provider Note   CSN: [id number] Arrival date & time: 03/05/20  0522     History No chief complaint on file.   Kristin Stephens is a 34 y.o. female.  Presents to ER with concern for nausea vomiting, dysuria.  Patient reports symptoms started on black Friday.  Initially just dysuria, then led to nausea and then vomiting.  She had some episodes of blood in her vomit.  States that lately her vomiting has somewhat decreased but still very nauseous and unable to keep anything down.  Patient went to urgent care, diagnosed with UTI.  Went to ER for multiple visits.  Given GI follow-up due to reported blood in vomit as well as PPI.  Patient was prescribed Keflex but she says she only took 2 or 3 doses because of her nausea.  Urine culture grew staph saprophyticus which was oxacillin resistant.  Patient was then you prescribed ciprofloxacin which she reports that she has not picked up yet.  Today a couple episodes of vomiting, nonbloody though she may have seen small streak earlier.  Still having abdominal pain, lower abdomen, denies any pelvic pain, no pelvic cramping, no vaginal discharge.  Reports she is in a monogamous relationship, denies prior history of STDs.  HPI     Past Medical History:  Diagnosis Date  . Abnormal Pap smear of cervix    age 43 with hx of cryotherapy  . ADHD (attention deficit hyperactivity disorder)   . Anxiety   . Asthma    daily and prn inhalers  . Bipolar affective disorder ([place])   . Dental crowns present   . Depression   . Deviated nasal septum 05/2012  . Eczema    right leg  . Esophageal reflux   . Fibromyalgia   . Heart murmur   . Hypertension   . IBS (irritable bowel syndrome)   . Migraine headache    with aura  . Nasal turbinate hypertrophy 05/2012   bilateral  . OCD (obsessive compulsive disorder)   . STD (sexually transmitted disease)    Hx of abnormal pap--age 45   . Substance abuse ([place])     recovering alcoholic  . TMJ syndrome     Patient Active Problem List   Diagnosis Date Noted  . Intentional overdose of drug in tablet form ([place]) 01/26/2017  . Bipolar 1 disorder, depressed, severe ([place]) 01/26/2017  . Bipolar 2 disorder ([place]) 05/16/2014  . Internal hemorrhoids with complication 67/89/3810  . Vasomotor rhinitis 04/15/2013  . Gastritis without bleeding 03/10/2013  . Anal fissure 03/10/2013  . Obsessive compulsive disorder 03/07/2013  . Fibromyalgia 02/06/2013  . Detachment of glenoid labrum 01/13/2013  . Difficulty in walking(719.7) 02/18/2012  . Seasonal affective disorder ([place]) 11/25/2011  . Asthma, intrinsic 08/28/2010  . ATTENTION DEFICIT DISORDER, ADULT 01/19/2008  . RHINITIS MEDICAMENTOSA 12/08/2007  . ALLERGIC RHINITIS 12/08/2007  . IBS 12/08/2007  . ECZEMA 12/08/2007  . 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 Migraine headache 04/24/2007  . Essential hypertension 04/24/2007  . ASVD 04/24/2007  . GERD 04/24/2007  . VSD 04/24/2007  . 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 ASD AND VSD REPAIR  1989  . CARDIAC CATHETERIZATION  06/18/2006  . CESAREAN SECTION  04/12/2009  . COARCTATION OF AORTA REPAIR  1989  . COLONOSCOPY N/A 05/20/2013   Procedure: COLONOSCOPY;  Surgeon: Danie Binder, MD;  Location: [facility];  Service: Endoscopy;  Laterality: N/A;  115-moved to [place] notified pt  . ESOPHAGOGASTRODUODENOSCOPY  04/04/2008     Normal esophagus/Mild patchy erythema in the antrum/ Normal duodenal bulb, normal small bowel biopsy  . FLEXIBLE SIGMOIDOSCOPY  04/04/2008     Small internal hemorrhoids (poor bowel prep)  . HEMORRHOID BANDING N/A 05/20/2013   Procedure: HEMORRHOID BANDING;  Surgeon: Danie Binder, MD;  Location: [facility];  Service: Endoscopy;  Laterality: N/A;  . NASAL SEPTOPLASTY W/ TURBINOPLASTY Bilateral 05/31/2012   Procedure: NASAL SEPTOPLASTY WITH TURBINATE REDUCTION;  Surgeon:  Ascencion Dike, MD;  Location: [facility];  Service: ENT;  Laterality: Bilateral;     OB History    Gravida  1   Para  1   Term  1   Preterm      AB      Living  1     SAB      TAB      Ectopic      Multiple      Live Births              Family History  Problem Relation Age of Onset  . Hyperlipidemia Mother   . Hypertension Mother   . Depression Mother   . Physical abuse Mother   . Sexual abuse Mother        possibly  . Sleep apnea Mother   . Stroke Mother   . Emphysema Paternal Grandmother   . Heart disease Paternal Grandmother   . Bipolar disorder Paternal Grandmother   . Dementia Paternal Grandmother   . Heart disease Maternal Grandmother   . Dementia Maternal Grandmother   . Stroke Maternal Grandfather   . Prostate cancer Maternal Grandfather   . Thyroid disease Maternal Grandfather   . Personality disorder Father   . Bipolar disorder Father   . Alcohol abuse Father   . ADD / ADHD Sister   . Anxiety disorder Brother   . ADD / ADHD Brother   . Seizures Daughter   . Drug abuse Neg Hx   . OCD Neg Hx   . Paranoid behavior Neg Hx   . Schizophrenia Neg Hx     Social History   Tobacco Use  . Smoking status: Current Every Day Smoker    Packs/day: 0.50    Years: 10.00    Pack years: 5.00    Types: Cigarettes    Start date: 02/27/2002  . Smokeless tobacco: Never Used  . Tobacco comment: has cut back to less than 1/2 a pack and trying to quit  Vaping Use  . Vaping Use: Never used  Substance Use Topics  . 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 Drug use: No    Home Medications Prior to Admission medications   Medication Sig Start Date End Date Taking? Authorizing Provider  acetaminophen (TYLENOL) 500 MG tablet Take 1,000-2,000 mg by mouth every 6 (six) hours as needed for mild pain, moderate pain, fever or headache.    [provider]   albuterol (PROVENTIL HFA;VENTOLIN HFA) 108 (90 Base) MCG/ACT inhaler Inhale 1-2 puffs into the lungs every 6 (six) hours as needed for wheezing or shortness of breath.    [provider]  ARIPiprazole (ABILIFY) 15 MG tablet Take 1 tablet (15 mg total) by mouth daily. 02/15/20   Arfeen, Arlyce Harman, MD  cephALEXin (KEFLEX) 500 MG capsule Take 1  capsule (500 mg total) by mouth 3 (three) times daily. 74/1/28   Delora Fuel, MD  ciprofloxacin (CIPRO) 500 MG tablet Take 1 tablet (500 mg total) by mouth every 12 (twelve) hours for 7 days. 03/03/20 03/10/20  Henderly, Britni A, PA-C  dicyclomine (BENTYL) 20 MG tablet Take 1 tablet (20 mg total) by mouth 2 (two) times daily as needed for spasms (abdominal cramping). 03/01/20   Mesner, Corene Cornea, MD  doxycycline (VIBRAMYCIN) 100 MG capsule Take 1 capsule (100 mg total) by mouth 2 (two) times daily for 7 days. 03/05/20 03/12/20  Lucrezia Starch, MD  famotidine (PEPCID) 20 MG tablet Take 1 tablet (20 mg total) by mouth 2 (two) times daily. 12/26/18   Kirichenko, Tatyana, PA-C  FLUoxetine (PROZAC) 40 MG capsule Take 1 capsule (40 mg total) by mouth daily. 02/15/20   Arfeen, Arlyce Harman, MD  fluticasone (FLONASE) 50 MCG/ACT nasal spray USE 2 SPRAYS IN EACH NOSTRIL EVERY DAY Patient taking differently: Place 2 sprays into both nostrils daily.  09/21/17   Mikey Kirschner, MD  HYDROCORTISONE EX Apply 1 application topically as needed (ECZEMA).    [provider]  hydrOXYzine (ATARAX/VISTARIL) 25 MG tablet Take 1 tablet (25 mg total) by mouth daily as needed for anxiety. 02/15/20   Arfeen, Arlyce Harman, MD  lamoTRIgine (LAMICTAL) 200 MG tablet Take 1 tablet (200 mg total) by mouth at bedtime. 02/15/20   Arfeen, Arlyce Harman, MD  lisdexamfetamine (VYVANSE) 40 MG capsule Take 1 capsule (40 mg total) by mouth daily. 02/15/20   Arfeen, Arlyce Harman, MD  loratadine (CLARITIN) 10 MG tablet Take 1 tablet by mouth daily as needed for allergies.     [provider]  metoprolol  succinate (TOPROL XL) 25 MG 24 hr tablet Take 1 tablet (25 mg total) by mouth daily. 09/26/19   [name], MD  omeprazole (PRILOSEC) 20 MG capsule 1 PO 30 mins prior to breakfast and supper Patient taking differently: Take 20 mg by mouth 2 (two) times daily before a meal.  01/10/20   Erenest Rasher, PA-C  ondansetron (ZOFRAN ODT) 4 MG disintegrating tablet Take 1 tablet (4 mg total) by mouth every 8 (eight) hours as needed for nausea or vomiting. 03/05/20   Lucrezia Starch, MD  ondansetron (ZOFRAN) 4 MG tablet Take 1 tablet (4 mg total) by mouth every 6 (six) hours as needed for nausea or vomiting. 78/6/76   Delora Fuel, MD  promethazine (PHENERGAN) 25 MG suppository Place 1 suppository (25 mg total) rectally every 6 (six) hours as needed for nausea or vomiting. 03/01/20   Mesner, Corene Cornea, MD  promethazine (PHENERGAN) 25 MG tablet Take 1 tablet (25 mg total) by mouth every 6 (six) hours as needed for nausea or vomiting. 03/01/20   Mesner, Corene Cornea, MD  traZODone (DESYREL) 50 MG tablet Take 1 tablet (50 mg total) by mouth at bedtime as needed for sleep. 11/03/19   Arfeen, Arlyce Harman, MD    Allergies    Sulfonamide derivatives, Coconut flavor, and Coconut oil  Review of Systems   Review of Systems  Constitutional: Positive for fatigue. Negative for chills and fever.  HENT: Negative for ear pain and sore throat.   Eyes: Negative for pain and visual disturbance.  Respiratory: Negative for cough and shortness of breath.   Cardiovascular: Negative for chest pain and palpitations.  Gastrointestinal: Positive for abdominal pain, nausea and vomiting.  Genitourinary: Negative for dysuria and hematuria.  Musculoskeletal: Negative for arthralgias and back pain.  Skin:  Negative for color change and rash.  Neurological: Negative for seizures and syncope.  All other systems reviewed and are negative.   Physical Exam Updated Vital Signs BP 133/74   Pulse 83   Temp 99.1 F (37.3 C)   Resp 16   SpO2 97%    Physical Exam Vitals and nursing note reviewed.  Constitutional:      General: She is not in acute distress.    Appearance: She is well-developed.  HENT:     Head: Normocephalic and atraumatic.  Eyes:     Conjunctiva/sclera: Conjunctivae normal.  Cardiovascular:     Rate and Rhythm: Normal rate and regular rhythm.     Heart sounds: No murmur heard.   Pulmonary:     Effort: Pulmonary effort is normal. No respiratory distress.     Breath sounds: Normal breath sounds.  Abdominal:     Palpations: Abdomen is soft.     Tenderness: There is no abdominal tenderness.     Comments: Tenderness to palpation across lower abdomen, worse in right lower quadrant  Musculoskeletal:     Cervical back: Neck supple.  Skin:    General: Skin is warm and dry.  Neurological:     General: No focal deficit present.     Mental Status: She is alert.  Psychiatric:        Mood and Affect: Mood normal.        Behavior: Behavior normal.     ED Results / Procedures / Treatments   Labs (all labs ordered are listed, but only abnormal results are displayed) Labs Reviewed  CBC - Abnormal; Notable for the following components:      Result Value   RBC 5.52 (*)    Hemoglobin 16.2 (*)    HCT 50.8 (*)    Platelets 403 (*)    All other components within normal limits  COMPREHENSIVE METABOLIC PANEL - Abnormal; Notable for the following components:   Potassium 3.3 (*)    CO2 19 (*)    Glucose, Bld 121 (*)    AST 53 (*)    ALT 102 (*)    Total Bilirubin 2.6 (*)    Anion gap 18 (*)    All other components within normal limits  URINALYSIS, ROUTINE W REFLEX MICROSCOPIC - Abnormal; Notable for the following components:   Color, Urine AMBER (*)    APPearance HAZY (*)    Bilirubin Urine SMALL (*)    Ketones, ur 5 (*)    All other components within normal limits  URINE CULTURE  PREGNANCY, URINE    EKG None  Radiology CT ABDOMEN PELVIS W CONTRAST  Result Date: 03/05/2020 CLINICAL DATA:  Right lower  quadrant pain, UTI EXAM: CT ABDOMEN AND PELVIS WITH CONTRAST TECHNIQUE: Multidetector CT imaging of the abdomen and pelvis was performed using the standard protocol following bolus administration of intravenous contrast. CONTRAST:  196mL OMNIPAQUE IOHEXOL 300 MG/ML  SOLN COMPARISON:  02/29/2020 noncontrast CT FINDINGS: Lower chest: No acute abnormality. Hepatobiliary: Enhancing lesion of the right hepatic lobe with central areas of low attenuation previously characterized as focal nodular hyperplasia. Gallbladder is unremarkable. Pancreas: Unremarkable. No pancreatic ductal dilatation or surrounding inflammatory changes. Spleen: Normal in size without focal abnormality. Adrenals/Urinary Tract: Adrenals are unremarkable. Kidneys demonstrate symmetric enhancement. Two punctate nonobstructing right renal calculi. Ureters are normal in caliber. Bladder is poorly distended but otherwise unremarkable. Stomach/Bowel: Stomach is within normal limits. Bowel is normal in caliber. Normal appendix. Vascular/Lymphatic: No significant vascular abnormality. No enlarged lymph  nodes. Reproductive: Uterus is unremarkable. No adnexal mass. 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 This has been present on prior studies dating back to 2008. Other: No ascites.  No acute abnormality of the abdominal wall. Musculoskeletal: No acute osseous abnormality. IMPRESSION: No evidence of ascending urinary tract infection. Punctate nonobstructing right renal calculi again seen. Stable chronic findings detailed above. Electronically Signed   By: Macy Mis M.D.   On: 03/05/2020 09:29    Procedures Procedures (including critical care time)  Medications Ordered in ED Medications  ondansetron (ZOFRAN) injection 4 mg (4 mg Intravenous Given 03/05/20 0839)  sodium chloride 0.9 % bolus 1,000 mL (0 mLs Intravenous Stopped 03/05/20 1021)  fentaNYL (SUBLIMAZE) injection 50 mcg (50 mcg Intravenous Given 03/05/20 0840)  iohexol  (OMNIPAQUE) 300 MG/ML solution 100 mL (100 mLs Intravenous Contrast Given 03/05/20 0901)  doxycycline (VIBRA-TABS) tablet 100 mg (100 mg Oral Given 03/05/20 1021)  droperidol (INAPSINE) 2.5 MG/ML injection 1.25 mg (1.25 mg Intravenous Given 03/05/20 1134)  0.9 %  sodium chloride infusion ( Intravenous Stopped 03/05/20 1545)    ED Course  I have reviewed the triage vital signs and the nursing notes.  Pertinent labs & imaging results that were available during my care of the patient were reviewed by me and considered in my medical decision making (see chart for details).  Clinical Course as of Mar 06 1622  Mon Mar 05, 2020  1016 Symptoms improved, will try PO trial   [RD]  1036 Vomited, will give additional antiemetics, fluids, switch antibiotics to IV   [RD]    Clinical Course User Index [RD] Lucrezia Starch, MD   MDM Rules/Calculators/A&P                          34 year old lady presenting to ER with concern for nausea and vomiting in the setting of recently diagnosed UTI.  On exam patient was well-appearing in no distress with stable vital signs.  Did note some tenderness on exam.  Given repeated ER visits, worsening symptoms since original CT scan, repeated CT to rule out any new pathology.  No new concerning findings were found on CT.  UA today not convincing for infection however after review with pharmacy, the antibiotic ceftriaxone and cephalexin that she originally received would have been inadequate for the original staph saprophyticus that grew from recent culture.  Will send repeat culture.  Will start doxycycline for antibiotics.  Her lab work is consistent with some mild dehydration.  Provided fluids, antiemetics and pain control.  Ultimately, symptoms were well controlled and patient was able to tolerate p.o. offered admission given multiple doses of antiemetic required to obtain symptom control.  Patient comfortable and interested in pursuing outpatient therapy at this time.   Reviewed strict return precautions with her and strongly recommended recheck with her primary doctor.  After the discussed management above, the patient was determined to be safe for discharge.  The patient was in agreement with this plan and all questions regarding their care were answered.  ED return precautions were discussed and the patient will return to the ED with any significant worsening of condition.    Final Clinical Impression(s) / ED Diagnoses Final diagnoses:  Urinary tract infection without hematuria, site unspecified  Nausea and vomiting, intractability of vomiting not specified, unspecified vomiting type    Rx / DC Orders ED Discharge Orders         Ordered    ondansetron (ZOFRAN ODT)  4 MG disintegrating tablet  Every 8 hours PRN        03/05/20 1427    doxycycline (VIBRAMYCIN) 100 MG capsule  2 times daily        03/05/20 1427           Lucrezia Starch, MD 03/06/20 1623

## 2020-03-05 NOTE — ED Triage Notes (Signed)
Pt here for follow up on an UTI , pt/'s needs Cipro or Levaquin according to her urine culture , states that she was unable to keep first antibiotics down due to nausea and left ED last visit due to volume

## 2020-03-07 LAB — URINE CULTURE

## 2020-04-03 ENCOUNTER — Telehealth (HOSPITAL_COMMUNITY): Payer: Self-pay | Admitting: *Deleted

## 2020-04-03 DIAGNOSIS — F900 Attention-deficit hyperactivity disorder, predominantly inattentive type: Secondary | ICD-10-CM

## 2020-04-03 MED ORDER — LISDEXAMFETAMINE DIMESYLATE 40 MG PO CAPS: 40.0000 mg | ORAL_CAPSULE | Freq: Every day | ORAL | 0 refills | Status: DC

## 2020-04-03 NOTE — Telephone Encounter (Signed)
Pt called requesting a refill on the Vyvanse last sent on 02/09/20. Pt has an appointment on 05/10/20. Please review.

## 2020-04-03 NOTE — Telephone Encounter (Signed)
Done

## 2020-04-05 ENCOUNTER — Telehealth (INDEPENDENT_AMBULATORY_CARE_PROVIDER_SITE_OTHER): Payer: Medicaid Other | Admitting: Primary Care

## 2020-05-01 DIAGNOSIS — U071 COVID-19: Secondary | ICD-10-CM

## 2020-05-01 HISTORY — DX: COVID-19: U07.1

## 2020-05-03 ENCOUNTER — Encounter (HOSPITAL_COMMUNITY): Payer: Self-pay | Admitting: Psychiatry

## 2020-05-03 ENCOUNTER — Other Ambulatory Visit: Payer: Self-pay

## 2020-05-03 ENCOUNTER — Telehealth (INDEPENDENT_AMBULATORY_CARE_PROVIDER_SITE_OTHER): Payer: Self-pay | Admitting: Psychiatry

## 2020-05-03 DIAGNOSIS — F3131 Bipolar disorder, current episode depressed, mild: Secondary | ICD-10-CM

## 2020-05-03 DIAGNOSIS — F900 Attention-deficit hyperactivity disorder, predominantly inattentive type: Secondary | ICD-10-CM

## 2020-05-03 DIAGNOSIS — F411 Generalized anxiety disorder: Secondary | ICD-10-CM

## 2020-05-03 MED ORDER — LISDEXAMFETAMINE DIMESYLATE 40 MG PO CAPS: 40.0000 mg | ORAL_CAPSULE | Freq: Every day | ORAL | 0 refills | Status: DC

## 2020-05-03 MED ORDER — LAMOTRIGINE 200 MG PO TABS: 200.0000 mg | ORAL_TABLET | Freq: Every day | ORAL | 2 refills | Status: DC

## 2020-05-03 MED ORDER — FLUOXETINE HCL 40 MG PO CAPS: 40.0000 mg | ORAL_CAPSULE | Freq: Every day | ORAL | 2 refills | Status: DC

## 2020-05-03 MED ORDER — ARIPIPRAZOLE 15 MG PO TABS: 15.0000 mg | ORAL_TABLET | Freq: Every day | ORAL | 2 refills | Status: DC

## 2020-05-03 MED ORDER — HYDROXYZINE HCL 25 MG PO TABS: 25.0000 mg | ORAL_TABLET | Freq: Every day | ORAL | 0 refills | Status: DC | PRN

## 2020-05-03 NOTE — Progress Notes (Signed)
Virtual Visit via Telephone Note  I connected with Kristin Stephens Stephens on 05/03/20 at 11:20 AM EST by telephone and verified that I am speaking with the correct person using two identifiers.  Location: Patient: Work Provider: Biomedical scientist   I discussed the limitations, risks, security and privacy concerns of performing an evaluation and management service by telephone and the availability of in person appointments. I also discussed with the patient that there may be a patient responsible charge related to this service. The patient expressed understanding and agreed to proceed.   History of Present Illness: Patient is evaluated by phone session.  She is on the phone by herself.  She is doing well on her current medication.  She is now moved to a new place with her boyfriend Kristin Stephens Stephens and things are going well.  Next week will be 2 years in the relationship.  Patient is sad that she could not visit her daughter in person because exposure to COVID at work.  She does not want to expose her family members.  However she is excited because next week and she is going to visit her daughter.  Patient told her sister is now out of rehab.  Patient's mother and brother-in-law is now taking care of her daughter.  Patient told job is going well.  She denies any anger, mania, psychosis or any hallucination.  She is sleeping good and does not take trazodone but does take hydroxyzine at night.  She feels her anxiety is under control.  She is able to do multitasking and her attention concentration is good.  She lost 5 pounds since the last visit.  Recently she was seen in the emergency room for UTI.  Patient has no tremors, shakes or any EPS.   Past Psychiatric History:Reviewed. H/Omood swing, depression, anger,overdoseandslashing her wrist.Did IOP in 2016. Inpatientin October 2018.H/O ETOH.Took Adderall, Ativan,Gabapentin,Wellbutrin but limited response.   Psychiatric Specialty Exam: Physical Exam  Review  of Systems  Weight 227 lb (103 kg).There is no height or weight on file to calculate BMI.  General Appearance: NA  Eye Contact:  NA  Speech:  Clear and Coherent  Volume:  Normal  Mood:  Euthymic  Affect:  NA  Thought Process:  Goal Directed  Orientation:  Full (Time, Place, and Person)  Thought Content:  WDL  Suicidal Thoughts:  No  Homicidal Thoughts:  No  Memory:  Immediate;   Good Recent;   Good Remote;   Good  Judgement:  Good  Insight:  Present  Psychomotor Activity:  NA  Concentration:  Concentration: Good and Attention Span: Good  Recall:  Good  Fund of Knowledge:  Good  Language:  Good  Akathisia:  No  Handed:  Right  AIMS (if indicated):     Assets:  Communication Skills Desire for Improvement Housing Social Support Talents/Skills Transportation  ADL's:  Intact  Cognition:  WNL  Sleep:   ok      Assessment and Plan: Bipolar disorder type I.  ADD, inattentive type.  Anxiety.  Patient is stable on her current medication.  Her job is going well and IT consultant.  Discussed medication side effects and benefits.  Continue Lamictal 200 mg daily, hydroxyzine 25 mg as needed at bedtime, Prozac 40 mg daily, Vyvanse 40 mg in the morning and Abilify 15 mg at bedtime.  Recommended to call us back if she is any question or any concern.  Follow-up in 3 months.  Follow Up Instructions:    I discussed the assessment  and treatment plan with the patient. The patient was provided an opportunity to ask questions and all were answered. The patient agreed with the plan and demonstrated an understanding of the instructions.   The patient was advised to call back or seek an in-person evaluation if the symptoms worsen or if the condition fails to improve as anticipated.  I provided 13 minutes of non-face-to-face time during this encounter.   Kathlee Nations, MD

## 2020-05-09 ENCOUNTER — Telehealth: Payer: Self-pay | Admitting: Internal Medicine

## 2020-05-09 DIAGNOSIS — Q251 Coarctation of aorta: Secondary | ICD-10-CM

## 2020-05-09 DIAGNOSIS — Q249 Congenital malformation of heart, unspecified: Secondary | ICD-10-CM

## 2020-05-09 NOTE — Telephone Encounter (Signed)
Left VM with pts machine Not clear when she frst developed symptoms   GIven her cardiac history she would be a candidate for poss Rx with antiviral or monoclonal AB Please forward info to infusion center

## 2020-05-09 NOTE — Telephone Encounter (Signed)
Noticing a little more palpitations.  But otherwise she feels fine but just cannot taste.  Vit D, Zinc, Vit C and melatonin long acting.  She will get a little extra fluids and rest.  She is aware I will forward to Dr. Harrington Challenger to let her know.

## 2020-05-09 NOTE — Telephone Encounter (Signed)
    Pt said she got covid, she wanted to ask Dr. Harrington Challenger what are the things she needs to look out for, signs and symptoms related to her heart.

## 2020-05-10 ENCOUNTER — Telehealth (HOSPITAL_COMMUNITY): Payer: Self-pay | Admitting: Psychiatry

## 2020-05-10 NOTE — Telephone Encounter (Signed)
   Pt is returning Dr. Alan Ripper call.

## 2020-05-10 NOTE — Telephone Encounter (Signed)
Called patient to get information for the referral to the monoclonal antibodies. Symptoms started 05/03/20.  Information was entered into the referral and patient is aware she should receive a call from the infusion team regarding her status. I answered questions to her satisfaction and she thanked me for the call.

## 2020-05-16 ENCOUNTER — Ambulatory Visit: Payer: Medicaid Other

## 2020-05-18 ENCOUNTER — Other Ambulatory Visit: Payer: Self-pay

## 2020-05-18 ENCOUNTER — Ambulatory Visit (INDEPENDENT_AMBULATORY_CARE_PROVIDER_SITE_OTHER): Payer: Medicaid Other | Admitting: Anesthesiology

## 2020-05-18 DIAGNOSIS — Z3042 Encounter for surveillance of injectable contraceptive: Secondary | ICD-10-CM

## 2020-05-18 MED ORDER — MEDROXYPROGESTERONE ACETATE 150 MG/ML IM SUSP
150.0000 mg | Freq: Once | INTRAMUSCULAR | Status: AC
  Administered 2020-05-18: 150 mg via INTRAMUSCULAR

## 2020-05-29 NOTE — Telephone Encounter (Signed)
I have spoken with pt  SHe told me of symptoms  Symptoms when in bed/ not active  No symptoms with activity May be related to COVID     I do not think related to arterial perfusion to legs If persist recomm she discuss with PCP

## 2020-05-30 ENCOUNTER — Telehealth (HOSPITAL_COMMUNITY): Payer: Self-pay | Admitting: *Deleted

## 2020-05-30 DIAGNOSIS — F900 Attention-deficit hyperactivity disorder, predominantly inattentive type: Secondary | ICD-10-CM

## 2020-05-30 MED ORDER — LISDEXAMFETAMINE DIMESYLATE 40 MG PO CAPS: 40.0000 mg | ORAL_CAPSULE | Freq: Every day | ORAL | 0 refills | Status: DC

## 2020-05-30 NOTE — Telephone Encounter (Signed)
Pt called requesting a refill of the VyVanse. Pt has an appointment upcoming on 07/31/20.

## 2020-05-30 NOTE — Telephone Encounter (Signed)
Done. She can pick up on her due date. 

## 2020-06-29 ENCOUNTER — Telehealth: Payer: Self-pay | Admitting: Internal Medicine

## 2020-06-29 DIAGNOSIS — Q251 Coarctation of aorta: Secondary | ICD-10-CM

## 2020-06-29 NOTE — Telephone Encounter (Signed)
   Pt said she gets echo every year before f/u with Dr. Harrington Challenger. No order on file

## 2020-06-30 NOTE — Telephone Encounter (Signed)
Set her up for echo mid summer and I will see her after

## 2020-07-02 ENCOUNTER — Telehealth (HOSPITAL_COMMUNITY): Payer: Self-pay | Admitting: *Deleted

## 2020-07-02 DIAGNOSIS — F900 Attention-deficit hyperactivity disorder, predominantly inattentive type: Secondary | ICD-10-CM

## 2020-07-02 NOTE — Telephone Encounter (Signed)
Echo order placed, message to pt to let her know she will be called to schedule for prior to her Nov 19, 2020 appointment.

## 2020-07-02 NOTE — Telephone Encounter (Signed)
Pt called requesting a refill on the VyVAnse. Pt ha san upcoming appointment 07/31/2020.

## 2020-07-03 MED ORDER — LISDEXAMFETAMINE DIMESYLATE 40 MG PO CAPS: 40.0000 mg | ORAL_CAPSULE | Freq: Every day | ORAL | 0 refills | Status: DC

## 2020-07-03 NOTE — Telephone Encounter (Signed)
Done

## 2020-07-31 ENCOUNTER — Other Ambulatory Visit: Payer: Self-pay

## 2020-07-31 ENCOUNTER — Encounter (HOSPITAL_COMMUNITY): Payer: Self-pay | Admitting: Psychiatry

## 2020-07-31 ENCOUNTER — Telehealth (INDEPENDENT_AMBULATORY_CARE_PROVIDER_SITE_OTHER): Payer: Self-pay | Admitting: Psychiatry

## 2020-07-31 ENCOUNTER — Telehealth (HOSPITAL_COMMUNITY): Payer: Self-pay | Admitting: Psychiatry

## 2020-07-31 DIAGNOSIS — F411 Generalized anxiety disorder: Secondary | ICD-10-CM

## 2020-07-31 DIAGNOSIS — F900 Attention-deficit hyperactivity disorder, predominantly inattentive type: Secondary | ICD-10-CM

## 2020-07-31 DIAGNOSIS — F3131 Bipolar disorder, current episode depressed, mild: Secondary | ICD-10-CM

## 2020-07-31 MED ORDER — ARIPIPRAZOLE 15 MG PO TABS: 15.0000 mg | ORAL_TABLET | Freq: Every day | ORAL | 2 refills | Status: DC

## 2020-07-31 MED ORDER — HYDROXYZINE HCL 25 MG PO TABS: 25.0000 mg | ORAL_TABLET | Freq: Every day | ORAL | 0 refills | Status: DC | PRN

## 2020-07-31 MED ORDER — FLUOXETINE HCL 40 MG PO CAPS: 40.0000 mg | ORAL_CAPSULE | Freq: Every day | ORAL | 2 refills | Status: DC

## 2020-07-31 MED ORDER — LISDEXAMFETAMINE DIMESYLATE 40 MG PO CAPS: 40.0000 mg | ORAL_CAPSULE | Freq: Every day | ORAL | 0 refills | Status: DC

## 2020-07-31 MED ORDER — LAMOTRIGINE 200 MG PO TABS: 200.0000 mg | ORAL_TABLET | Freq: Every day | ORAL | 2 refills | Status: DC

## 2020-07-31 NOTE — Progress Notes (Signed)
Virtual Visit via Telephone Note  I connected with Kristin Stephens on 07/31/20 at 11:20 AM EDT by telephone and verified that I am speaking with the correct person using two identifiers.  Location: Patient: Work Provider: Biomedical scientist   I discussed the limitations, risks, security and privacy concerns of performing an evaluation and management service by telephone and the availability of in person appointments. I also discussed with the patient that there may be a patient responsible charge related to this service. The patient expressed understanding and agreed to proceed.   History of Present Illness: Patient is evaluated by phone session.  She is taking her medication as prescribed and doing very well.  Her relationship with the boyfriend which are going well and he is studying Careers information officer and recently getting good grades.  Patient also in touch with her daughter who is doing well.  Patient was very happy when she received a call from her sister who invited her at her place.  Even though patient feels it is late that she invited but happy and like to visit her.  Patient denies any mania, hallucination, psychosis, crying spells or any suicidal thoughts.  She admitted sometimes forget to take Vyvanse and trouble working in the morning.  She cannot believe that she needs to put a reminder to keep her medication on time.  She feels her symptoms are very well controlled with the current medication.  She is not taking trazodone, antiallergy medication and acid reflux medication.  She is able to do multitasking and her attention concentration is good when she takes the Vyvanse.  Patient is working 40 hours a day [employer] and likes her job.  Patient denies any hallucination or any suicidal thoughts.  Her appetite is okay.  Her weight is stable.  She denies drinking or using any illegal substances.  She has no tremors shakes or any EPS.   Past Psychiatric History:Reviewed. H/Omood swing,  depression, anger,overdoseandslashing her wrist.Did IOP in 2016. Inpatientin October 2018.H/O ETOH.Took Adderall, Ativan,Gabapentin,Wellbutrin but limited response.   Psychiatric Specialty Exam: Physical Exam  Review of Systems  Weight 227 lb (103 kg).There is no height or weight on file to calculate BMI.  General Appearance: NA  Eye Contact:  NA  Speech:  Clear and Coherent and Normal Rate  Volume:  Normal  Mood:  Dysphoric  Affect:  NA  Thought Process:  Goal Directed  Orientation:  Full (Time, Place, and Person)  Thought Content:  Logical  Suicidal Thoughts:  No  Homicidal Thoughts:  No  Memory:  Immediate;   Good Recent;   Good Remote;   Good  Judgement:  Good  Insight:  Present  Psychomotor Activity:  NA  Concentration:  Concentration: Good and Attention Span: Good  Recall:  Good  Fund of Knowledge:  Good  Language:  Good  Akathisia:  No  Handed:  Right  AIMS (if indicated):     Assets:  Communication Skills Desire for Improvement Housing Resilience Social Support Transportation  ADL's:  Intact  Cognition:  WNL  Sleep:   ok      Assessment and Plan: Bipolar disorder type I.  ADD, inattentive type.  Anxiety.  Patient is stable on her current medication.  Usually she gets hypomanic in summer and I recommend to watch her symptoms as summer is coming.  Patient acknowledged and agreed with the plan.  We decided not to change the medication since patient is doing well and her symptoms are manageable.  Continue Prozac  40 mg daily, Vyvanse 40 mg in the morning, Abilify 15 mg at bedtime, Lamictal 200 mg daily and hydroxyzine 25 mg as needed for insomnia and anxiety at bedtime.  Discussed medication side effects and benefits.  She has no rash or any itching.  Recommended to call us back if she is any question or any concern.  Follow-up in 3 months.  Follow Up Instructions:    I discussed the assessment and treatment plan with the patient. The patient was  provided an opportunity to ask questions and all were answered. The patient agreed with the plan and demonstrated an understanding of the instructions.   The patient was advised to call back or seek an in-person evaluation if the symptoms worsen or if the condition fails to improve as anticipated.  I provided 14 minutes of non-face-to-face time during this encounter.   Kathlee Nations, MD

## 2020-08-03 ENCOUNTER — Other Ambulatory Visit: Payer: Self-pay

## 2020-08-03 ENCOUNTER — Ambulatory Visit (INDEPENDENT_AMBULATORY_CARE_PROVIDER_SITE_OTHER): Payer: Medicaid Other | Admitting: Gynecology

## 2020-08-03 DIAGNOSIS — Z3042 Encounter for surveillance of injectable contraceptive: Secondary | ICD-10-CM

## 2020-08-03 MED ORDER — MEDROXYPROGESTERONE ACETATE 150 MG/ML IM SUSP
150.0000 mg | Freq: Once | INTRAMUSCULAR | Status: AC
  Administered 2020-08-03: 150 mg via INTRAMUSCULAR

## 2020-08-30 ENCOUNTER — Telehealth (HOSPITAL_COMMUNITY): Payer: Self-pay | Admitting: *Deleted

## 2020-08-30 DIAGNOSIS — F900 Attention-deficit hyperactivity disorder, predominantly inattentive type: Secondary | ICD-10-CM

## 2020-08-30 NOTE — Telephone Encounter (Signed)
Pt called requesting refill of VyVanse which is due on 09/05/20. Pt has an upcoming appointment on 11/08/20. Please review.

## 2020-08-31 MED ORDER — LISDEXAMFETAMINE DIMESYLATE 40 MG PO CAPS: 40.0000 mg | ORAL_CAPSULE | Freq: Every day | ORAL | 0 refills | Status: DC

## 2020-08-31 NOTE — Telephone Encounter (Signed)
Done

## 2020-09-03 ENCOUNTER — Other Ambulatory Visit: Payer: Self-pay | Admitting: Internal Medicine

## 2020-09-18 NOTE — Progress Notes (Signed)
35 y.o. G27P1001 Single Caucasian female here for annual exam.    May want to stop Depo and discuss Tubal. Had postpartum depression and then was dx with bipolar disorder.  Would like pregnancy but knows it is difficult for her health. Wants really reliable pregnancy prevention.  Depo is working well for her.  Wants something that is 100% reliable.  She and her partner have both decided no future childbearing.  He is 33 yo and has not fathered children.  Off of Depo, her menses were heavy and she had cramping.  On Depo, she has some mild cramping and spotting before her cycle begins.   Sees cardiology yearly and psychiatrist every 3 months.   She has her first Covid booster.   Working for an [employer].   PCP: None    No LMP recorded. Patient has had an injection.     Period Cycle (Days):  (no cycles with Depo Provera--occ.spotting)     Sexually active: Yes.    The current method of family planning is Depo-Provera injections.    Exercising: No.  The patient does not participate in regular exercise at present. Smoker:  yes  Health Maintenance: Pap: 2020 normal per patient, 10-26-14 Neg:Neg HR HPV--hx of abnormal pap with cryotherapy to cervix age 24 per patient--normal since. History of abnormal Pap:  yes, Hx of cryotherapy to cervix ag 19. Paps normal since. MMG:  n/a Colonoscopy:  05-20-13 normal except for hemorrhoids. Done due to rectal bleeding. BMD:   n/a  Result  n/a TDaP:  12-02-12 Gardasil:   yes, completed HIV:Neg in preg Hep C:Unsure Screening Labs:  STD screening today.   reports that she has been smoking cigarettes. She started smoking about 18 years ago. She has a 5.00 pack-year smoking history. She has never used smokeless tobacco. She reports that she does not drink alcohol and does not use drugs.  Past Medical History:  Diagnosis Date   Abnormal Pap smear of cervix    age 68 with hx of cryotherapy   ADHD (attention deficit hyperactivity disorder)     Anxiety    Asthma    daily and prn inhalers   Bipolar affective disorder ([place])    COVID 05/2020   Dental crowns present    Depression    Deviated nasal septum 05/2012   Eczema    right leg   Esophageal reflux    Fibromyalgia    Heart murmur    Hypertension    IBS (irritable bowel syndrome)    Migraine headache    with aura   Nasal turbinate hypertrophy 05/2012   bilateral   OCD (obsessive compulsive disorder)    STD (sexually transmitted disease)    Hx of abnormal pap--age 29    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   TMJ syndrome     Past Surgical History:  Procedure Laterality Date   ASD AND VSD REPAIR  1989   CARDIAC CATHETERIZATION  06/18/2006   CESAREAN SECTION  04/12/2009   COARCTATION OF AORTA REPAIR  1989   COLONOSCOPY N/A 05/20/2013   Procedure: COLONOSCOPY;  Surgeon: Danie Binder, MD;  [facility]: [facility];  Service: Endoscopy;  Laterality: N/A;  115-moved to [place] notified pt   ESOPHAGOGASTRODUODENOSCOPY  04/04/2008     Normal esophagus/Mild patchy erythema in the antrum/ Normal duodenal bulb, normal small bowel biopsy   FLEXIBLE SIGMOIDOSCOPY  04/04/2008     Small internal hemorrhoids (poor bowel prep)   HEMORRHOID BANDING  N/A 05/20/2013   Procedure: Thayer Jew;  Surgeon: Danie Binder, MD;  Location: [facility];  Service: Endoscopy;  Laterality: N/A;   NASAL SEPTOPLASTY W/ TURBINOPLASTY Bilateral 05/31/2012   Procedure: NASAL SEPTOPLASTY WITH TURBINATE REDUCTION;  Surgeon: Ascencion Dike, MD;  Location: [facility];  Service: ENT;  Laterality: Bilateral;    Current Outpatient Medications  Medication Sig Dispense Refill   acetaminophen (TYLENOL) 500 MG tablet Take 1,000-2,000 mg by mouth every 6 (six) hours as needed for mild pain, moderate pain, fever or headache.     albuterol (PROVENTIL HFA;VENTOLIN HFA) 108 (90 Base) MCG/ACT inhaler Inhale 1-2 puffs into the lungs every 6 (six) hours as needed for wheezing or  shortness of breath.     ARIPiprazole (ABILIFY) 15 MG tablet Take 1 tablet (15 mg total) by mouth daily. 30 tablet 2   FLUoxetine (PROZAC) 40 MG capsule Take 1 capsule (40 mg total) by mouth daily. 30 capsule 2   fluticasone (FLONASE) 50 MCG/ACT nasal spray USE 2 SPRAYS IN EACH NOSTRIL EVERY DAY (Patient taking differently: Place 2 sprays into both nostrils daily.) 16 g 5   HYDROCORTISONE EX Apply 1 application topically as needed (ECZEMA).     hydrOXYzine (ATARAX/VISTARIL) 25 MG tablet Take 1 tablet (25 mg total) by mouth daily as needed for anxiety. 30 tablet 0   lamoTRIgine (LAMICTAL) 200 MG tablet Take 1 tablet (200 mg total) by mouth at bedtime. 30 tablet 2   lisdexamfetamine (VYVANSE) 40 MG capsule Take 1 capsule (40 mg total) by mouth daily. 30 capsule 0   loratadine (CLARITIN) 10 MG tablet Take 1 tablet by mouth daily as needed for allergies.     metoprolol succinate (TOPROL-XL) 25 MG 24 hr tablet TAKE 1 TABLET BY MOUTH EVERY DAY 90 tablet 0   traZODone (DESYREL) 50 MG tablet Take 1 tablet (50 mg total) by mouth at bedtime as needed for sleep. 30 tablet 0   No current facility-administered medications for this visit.    Family History  Problem Relation Age of Onset   Hyperlipidemia Mother    Hypertension Mother    Depression Mother    Physical abuse Mother    Sexual abuse Mother        possibly   Sleep apnea Mother    Stroke Mother    Emphysema Paternal Grandmother    Heart disease Paternal Grandmother    Bipolar disorder Paternal Grandmother    Dementia Paternal Grandmother    Heart disease Maternal Grandmother    Dementia Maternal Grandmother    Stroke Maternal Grandfather    Prostate cancer Maternal Grandfather    Thyroid disease Maternal Grandfather    Personality disorder Father    Bipolar disorder Father    Alcohol abuse Father    ADD / ADHD Sister    Anxiety disorder Brother    ADD / ADHD Brother    Seizures Daughter    Drug abuse Neg Hx    OCD Neg Hx     Paranoid behavior Neg Hx    Schizophrenia Neg Hx     Review of Systems  All other systems reviewed and are negative.  Exam:   BP 136/74 (Cuff Size: Large)   Pulse (!) 109   Ht 5' 5.5" (1.664 m)   Wt 219 lb (99.3 kg)   SpO2 98%   BMI 35.89 kg/m     General appearance: alert, cooperative and appears stated age Head: normocephalic, without obvious abnormality, atraumatic Neck: no adenopathy,  supple, symmetrical, trachea midline and thyroid normal to inspection and palpation Lungs: clear to auscultation bilaterally Breasts: normal appearance, no masses or tenderness, No nipple retraction or dimpling, No nipple discharge or bleeding, No axillary adenopathy Heart: regular rate and rhythm Abdomen: soft, non-tender; no masses, no organomegaly Extremities: extremities normal, atraumatic, no cyanosis or edema Skin: skin color, texture, turgor normal. No rashes or lesions Lymph nodes: cervical, supraclavicular, and axillary nodes normal. Neurologic: grossly normal  Pelvic: External genitalia:  no lesions              No abnormal inguinal nodes palpated.              Urethra:  normal appearing urethra with no masses, tenderness or lesions              Bartholins and Skenes: normal                 Vagina: normal appearing vagina with normal color and discharge, no lesions              Cervix: no lesions              Pap taken: yes Bimanual Exam:  Uterus:  normal size, contour, position, consistency, mobility, non-tender              Adnexa: no mass, fullness, tenderness           Chaperone was present for exam.  Assessment:   Well woman visit with normal exam. 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 Bipolar disorder. Hx PP depression.  STD screening.  Contraceptive counseling.   Plan: Mammogram screening discussed. Self breast awareness reviewed. Pap and HR HPV as above. Guidelines for Calcium, Vitamin D, regular exercise program including cardiovascular  and weight bearing exercise. STD screening.  We discussed Depo Provera, Mirena, Tubal ligation/salpingectomy, vasectomy, condom use.  She will continue Depo Provera 150 mg IM q 3 months for one year.  Has appt for July, 2022.  I discussed second Covid booster with her.  Follow up annually and prn.

## 2020-09-19 ENCOUNTER — Other Ambulatory Visit: Payer: Self-pay

## 2020-09-19 ENCOUNTER — Encounter: Payer: Self-pay | Admitting: Obstetrics and Gynecology

## 2020-09-19 ENCOUNTER — Ambulatory Visit (INDEPENDENT_AMBULATORY_CARE_PROVIDER_SITE_OTHER): Payer: Medicaid Other | Admitting: Obstetrics and Gynecology

## 2020-09-19 ENCOUNTER — Other Ambulatory Visit (HOSPITAL_COMMUNITY)
Admission: RE | Admit: 2020-09-19 | Discharge: 2020-09-19 | Disposition: A | Discharge status: Home / Self Care | Payer: Medicaid Other | Source: Ambulatory Visit | Attending: Obstetrics and Gynecology | Admitting: Obstetrics and Gynecology

## 2020-09-19 VITALS — BP 136/74 | HR 109 | Ht 65.5 in | Wt 219.0 lb

## 2020-09-19 DIAGNOSIS — Z113 Encounter for screening for infections with a predominantly sexual mode of transmission: Secondary | ICD-10-CM | POA: Insufficient documentation

## 2020-09-19 DIAGNOSIS — Z01419 Encounter for gynecological examination (general) (routine) without abnormal findings: Secondary | ICD-10-CM

## 2020-09-19 DIAGNOSIS — Z124 Encounter for screening for malignant neoplasm of cervix: Secondary | ICD-10-CM

## 2020-09-19 DIAGNOSIS — Z3009 Encounter for other general counseling and advice on contraception: Secondary | ICD-10-CM

## 2020-09-19 NOTE — Patient Instructions (Signed)

## 2020-09-20 LAB — CYTOLOGY - PAP
Chlamydia: NEGATIVE
Comment: NEGATIVE
Comment: NEGATIVE
Comment: NEGATIVE
Comment: NORMAL
Diagnosis: NEGATIVE
High risk HPV: NEGATIVE
Neisseria Gonorrhea: NEGATIVE
Trichomonas: NEGATIVE

## 2020-09-20 LAB — HIV ANTIBODY (ROUTINE TESTING W REFLEX): HIV 1&2 Ab, 4th Generation: NONREACTIVE

## 2020-09-20 LAB — RPR: RPR Ser Ql: NONREACTIVE

## 2020-09-20 LAB — HEPATITIS C ANTIBODY
Hepatitis C Ab: NONREACTIVE
SIGNAL TO CUT-OFF: 0.01 (ref <1.00)

## 2020-10-02 ENCOUNTER — Other Ambulatory Visit (HOSPITAL_COMMUNITY): Payer: Self-pay | Admitting: Psychiatry

## 2020-10-02 ENCOUNTER — Telehealth (HOSPITAL_COMMUNITY): Payer: Self-pay | Admitting: *Deleted

## 2020-10-02 DIAGNOSIS — F900 Attention-deficit hyperactivity disorder, predominantly inattentive type: Secondary | ICD-10-CM

## 2020-10-02 MED ORDER — LISDEXAMFETAMINE DIMESYLATE 40 MG PO CAPS: 40.0000 mg | ORAL_CAPSULE | Freq: Every day | ORAL | 0 refills | Status: DC

## 2020-10-02 NOTE — Telephone Encounter (Signed)
Pt called requesting refill of the VyVanse 40 mg/ pt has an upcoming appointment on 11/08/20. Please review.

## 2020-10-02 NOTE — Telephone Encounter (Signed)
Done

## 2020-10-19 ENCOUNTER — Ambulatory Visit (INDEPENDENT_AMBULATORY_CARE_PROVIDER_SITE_OTHER): Payer: Medicaid Other | Admitting: *Deleted

## 2020-10-19 ENCOUNTER — Other Ambulatory Visit: Payer: Self-pay

## 2020-10-19 VITALS — BP 120/78 | HR 70 | Resp 14 | Ht 65.0 in | Wt 225.0 lb

## 2020-10-19 DIAGNOSIS — Z3042 Encounter for surveillance of injectable contraceptive: Secondary | ICD-10-CM | POA: Diagnosis not present

## 2020-10-19 MED ORDER — MEDROXYPROGESTERONE ACETATE 150 MG/ML IM SUSP
150.0000 mg | Freq: Once | INTRAMUSCULAR | Status: AC
  Administered 2020-10-19: 150 mg via INTRAMUSCULAR

## 2020-10-19 NOTE — Progress Notes (Signed)
Patient is here for Depo Provera Injection Patient is within Depo Provera Calender Limits yes, last given 08-03-20 Next Depo Due between: 10/7 to 10/21 Last AEX: 09-19-20 BS AEX Scheduled: not currently scheduled  Patient is aware when next depo is due  Pt tolerated Injection well in [place].   Routed to provider for review, encounter closed.

## 2020-10-31 ENCOUNTER — Telehealth (HOSPITAL_COMMUNITY): Payer: Self-pay | Admitting: *Deleted

## 2020-10-31 NOTE — Telephone Encounter (Signed)
Pt called today requesting refill of the VyVanse 40 mg. [facility] confirms that last fill was on 10/05/20. Pt has an appointment upcoming on 11/08/20.

## 2020-11-01 ENCOUNTER — Other Ambulatory Visit (HOSPITAL_COMMUNITY): Payer: Self-pay | Admitting: Psychiatry

## 2020-11-01 DIAGNOSIS — F900 Attention-deficit hyperactivity disorder, predominantly inattentive type: Secondary | ICD-10-CM

## 2020-11-02 ENCOUNTER — Other Ambulatory Visit (HOSPITAL_COMMUNITY): Payer: Self-pay | Admitting: Psychiatry

## 2020-11-02 ENCOUNTER — Telehealth (HOSPITAL_COMMUNITY): Payer: Self-pay | Admitting: *Deleted

## 2020-11-02 DIAGNOSIS — F900 Attention-deficit hyperactivity disorder, predominantly inattentive type: Secondary | ICD-10-CM

## 2020-11-02 MED ORDER — LISDEXAMFETAMINE DIMESYLATE 40 MG PO CAPS: 40.0000 mg | ORAL_CAPSULE | Freq: Every day | ORAL | 0 refills | Status: DC

## 2020-11-02 NOTE — Telephone Encounter (Signed)
sent 

## 2020-11-02 NOTE — Telephone Encounter (Signed)
Pt of Dr. Adele Schilder who has called twice requesting refill of VyVanse 40 mg. Last script filled on 10/05/20. Pt has an upcoming appointment on 11/08/20. Please review. Thanks.

## 2020-11-08 ENCOUNTER — Encounter (HOSPITAL_COMMUNITY): Payer: Self-pay | Admitting: Psychiatry

## 2020-11-08 ENCOUNTER — Other Ambulatory Visit: Payer: Self-pay

## 2020-11-08 ENCOUNTER — Telehealth (INDEPENDENT_AMBULATORY_CARE_PROVIDER_SITE_OTHER): Payer: Self-pay | Admitting: Psychiatry

## 2020-11-08 VITALS — Wt 225.0 lb

## 2020-11-08 DIAGNOSIS — F419 Anxiety disorder, unspecified: Secondary | ICD-10-CM

## 2020-11-08 DIAGNOSIS — F3131 Bipolar disorder, current episode depressed, mild: Secondary | ICD-10-CM

## 2020-11-08 DIAGNOSIS — F900 Attention-deficit hyperactivity disorder, predominantly inattentive type: Secondary | ICD-10-CM

## 2020-11-08 MED ORDER — ARIPIPRAZOLE 15 MG PO TABS: 15.0000 mg | ORAL_TABLET | Freq: Every day | ORAL | 2 refills | Status: DC

## 2020-11-08 MED ORDER — LAMOTRIGINE 200 MG PO TABS: 200.0000 mg | ORAL_TABLET | Freq: Every day | ORAL | 2 refills | Status: DC

## 2020-11-08 MED ORDER — FLUOXETINE HCL 40 MG PO CAPS: 40.0000 mg | ORAL_CAPSULE | Freq: Every day | ORAL | 2 refills | Status: DC

## 2020-11-08 MED ORDER — HYDROXYZINE HCL 25 MG PO TABS: 25.0000 mg | ORAL_TABLET | Freq: Every day | ORAL | 0 refills | Status: DC | PRN

## 2020-11-08 MED ORDER — LISDEXAMFETAMINE DIMESYLATE 40 MG PO CAPS: 40.0000 mg | ORAL_CAPSULE | Freq: Every day | ORAL | 0 refills | Status: DC

## 2020-11-08 NOTE — Progress Notes (Signed)
Virtual Visit via Telephone Note  I connected with Kristin Stephens on 11/08/20 at  2:00 PM EDT by telephone and verified that I am speaking with the correct person using two identifiers.  Location: Patient: Work Provider: Office   I discussed the limitations, risks, security and privacy concerns of performing an evaluation and management service by telephone and the availability of in person appointments. I also discussed with the patient that there may be a patient responsible charge related to this service. The patient expressed understanding and agreed to proceed.   History of Present Illness: Patient is evaluated by phone session.  She is taking all her medication as prescribed.  She is sleeping good and does not have to take the trazodone for more than 6 months.  Today she is excited and also anxious because she will get the news if her daughter can stay with her.  Patient told her daughter's therapist will decide today if daughter is okay to stay overnight.  Patient does not have the custody of the daughter but she does visit her frequently.  Patient is hoping that decision goes in her favor.  Overall she feels things are going well.  She denies any mania, psychosis, hallucination or any impulsive behavior.  Her relationship with her boyfriend is going well.  Her boyfriend is going to start the school and she is happy about it.  Patient also working 40 hours a week in an [employer] and her job is going well.  Her appetite is okay.  She has no tremors shakes or any EPS.  She takes the Vyvanse that helps her focus, attention and multitasking.  She is not drinking or using any illegal substances.  Past Psychiatric History: Reviewed. H/O mood swing, depression, anger, overdose and slashing her wrist.  Did IOP in 2016. Inpatient in October 2018. H/O ETOH. Took Adderall, Ativan, Gabapentin, Wellbutrin but limited response.     Psychiatric Specialty Exam: Physical Exam  Review of Systems   Weight 225 lb (102.1 kg), last menstrual period 10/13/2020.Body mass index is 37.44 kg/m.  General Appearance: NA  Eye Contact:  NA  Speech:  Clear and Coherent  Volume:  Normal  Mood:  Anxious  Affect:  NA  Thought Process:  Goal Directed  Orientation:  Full (Time, Place, and Person)  Thought Content:  WDL  Suicidal Thoughts:  No  Homicidal Thoughts:  No  Memory:  Immediate;   Good Recent;   Good Remote;   Good  Judgement:  Good  Insight:  Good  Psychomotor Activity:  NA  Concentration:  Concentration: Good and Attention Span: Good  Recall:  Good  Fund of Knowledge:  Good  Language:  Good  Akathisia:  No  Handed:  Right  AIMS (if indicated):     Assets:  Communication Skills Desire for Improvement Housing Social Support Transportation  ADL's:  Intact  Cognition:  WNL  Sleep:   better      Assessment and Plan: Bipolar disorder type I.  ADD, inattentive type.  Anxiety.  Patient is stable on her current medication.  Continue Prozac 40 mg daily, Vyvanse 40 mg Monday to Saturday,, Lamictal 200 mg daily, hydroxyzine 25 mg daily and Abilify 15 mg daily.  Recommended to call us back if she has any question or any concern.  Follow-up in 3 months.  Follow Up Instructions:    I discussed the assessment and treatment plan with the patient. The patient was provided an opportunity to ask questions and all  were answered. The patient agreed with the plan and demonstrated an understanding of the instructions.   The patient was advised to call back or seek an in-person evaluation if the symptoms worsen or if the condition fails to improve as anticipated.  I provided 16 minutes of non-face-to-face time during this encounter.   Kathlee Nations, MD

## 2020-11-12 ENCOUNTER — Ambulatory Visit (HOSPITAL_COMMUNITY): Payer: Self-pay | Attending: Internal Medicine

## 2020-11-12 ENCOUNTER — Other Ambulatory Visit: Payer: Self-pay

## 2020-11-12 DIAGNOSIS — Q251 Coarctation of aorta: Secondary | ICD-10-CM | POA: Insufficient documentation

## 2020-11-12 LAB — ECHOCARDIOGRAM COMPLETE
Area-P 1/2: 3.58 cm2
S' Lateral: 2.5 cm

## 2020-11-19 ENCOUNTER — Encounter: Payer: Self-pay | Admitting: Internal Medicine

## 2020-11-19 ENCOUNTER — Ambulatory Visit (INDEPENDENT_AMBULATORY_CARE_PROVIDER_SITE_OTHER): Payer: Self-pay | Admitting: Internal Medicine

## 2020-11-19 ENCOUNTER — Other Ambulatory Visit: Payer: Self-pay

## 2020-11-19 VITALS — BP 130/92 | HR 98 | Ht 65.0 in | Wt 229.0 lb

## 2020-11-19 DIAGNOSIS — Q251 Coarctation of aorta: Secondary | ICD-10-CM

## 2020-11-19 DIAGNOSIS — I10 Essential (primary) hypertension: Secondary | ICD-10-CM

## 2020-11-19 MED ORDER — METOPROLOL SUCCINATE ER 25 MG PO TB24: 37.5000 mg | ORAL_TABLET | Freq: Every day | ORAL | 3 refills | Status: DC

## 2020-11-19 NOTE — Progress Notes (Signed)
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 female with a history of ASD/VSD/Coarctation.  She is s/p repair  MRI of chest in Oct 2017 showed Aortic root was very mildy dilated.  Coarc repair site OK   Echo done in March 2020:   LVEF/RVEF normal   Mild AI   Peak velocity down in descending aorta was 1.8 m/sec  I saw the pt in clinic in June 2021  At the time she complained of some palpitations   Since then the pt says she notices some skipping of her heart   Most often at night or when anxious   She denies diziness     BP at 120/ 78 at last visit   She denies CP   She says she got COVID in Feb   Improving   Less tired Breathing is OK    Diet    Br:   Not big on Skips  Lunch  [place] big lung but usually small Dinner:  Meat and vegg or pizza Drinks;   Soda  [facility])   Outpatient Medications Prior to Visit  Medication Sig Dispense Refill   acetaminophen (TYLENOL) 500 MG tablet Take 1,000-2,000 mg by mouth every 6 (six) hours as needed for mild pain, moderate pain, fever or headache.     albuterol (PROVENTIL HFA;VENTOLIN HFA) 108 (90 Base) MCG/ACT inhaler Inhale 1-2 puffs into the lungs every 6 (six) hours as needed for wheezing or shortness of breath.     ARIPiprazole (ABILIFY) 15 MG tablet Take 1 tablet (15 mg total) by mouth daily. 30 tablet 2   FLUoxetine (PROZAC) 40 MG capsule Take 1 capsule (40 mg total) by mouth daily. 30 capsule 2   fluticasone (FLONASE) 50 MCG/ACT nasal spray USE 2 SPRAYS IN EACH NOSTRIL EVERY DAY (Patient taking differently: Place 2 sprays into both nostrils daily.) 16 g 5   HYDROCORTISONE EX Apply 1 application topically as needed (ECZEMA).     hydrOXYzine (ATARAX/VISTARIL) 25 MG tablet Take 1 tablet (25 mg total)  by mouth daily as needed for anxiety. 30 tablet 0   ibuprofen (ADVIL) 200 MG tablet Take 200 mg by mouth every 6 (six) hours as needed for mild pain or headache.     lamoTRIgine (LAMICTAL) 200 MG tablet Take 1 tablet (200 mg total) by mouth at bedtime. 30 tablet 2   lisdexamfetamine (VYVANSE) 40 MG capsule Take 1 capsule (40 mg total) by mouth daily. 30 capsule 0   loratadine (CLARITIN) 10 MG tablet Take 1 tablet by mouth daily as needed for allergies.     medroxyPROGESTERone (DEPO-PROVERA) 150 MG/ML injection Inject 150 mg into the muscle every 3 (three) months.     omeprazole (PRILOSEC) 20 MG capsule Take 20 mg by mouth daily.     traZODone (DESYREL) 50 MG tablet Take 1 tablet (50 mg total) by mouth at bedtime as needed for sleep. 30 tablet 0   metoprolol succinate (TOPROL-XL) 25 MG 24 hr tablet TAKE 1 TABLET BY MOUTH EVERY DAY 90 tablet 0   No facility-administered medications prior to visit.     Allergies:   Sulfonamide derivatives, Coconut flavor, and Coconut oil   Past Medical History:  Diagnosis  Date   Abnormal Pap smear of cervix    age 83 with hx of cryotherapy   ADHD (attention deficit hyperactivity disorder)    Anxiety    Asthma    daily and prn inhalers   Bipolar affective disorder ([place])    COVID 05/2020   Dental crowns present    Depression    Deviated nasal septum 05/2012   Eczema    right leg   Esophageal reflux    Fibromyalgia    Heart murmur    Hypertension    IBS (irritable bowel syndrome)    Migraine headache    with aura   Nasal turbinate hypertrophy 05/2012   bilateral   OCD (obsessive compulsive disorder)    STD (sexually transmitted disease)    Hx of abnormal pap--age 34    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   TMJ syndrome     Past Surgical History:  Procedure Laterality Date   ASD AND VSD REPAIR  1989   CARDIAC CATHETERIZATION  06/18/2006   CESAREAN SECTION  04/12/2009   COARCTATION OF AORTA REPAIR  1989   COLONOSCOPY N/A  05/20/2013   Procedure: COLONOSCOPY;  Surgeon: Danie Binder, MD;  [facility];  Service: Endoscopy;  Laterality: N/A;  115-moved to [place] notified pt   ESOPHAGOGASTRODUODENOSCOPY  04/04/2008     Normal esophagus/Mild patchy erythema in the antrum/ Normal duodenal bulb, normal small bowel biopsy   FLEXIBLE SIGMOIDOSCOPY  04/04/2008     Small internal hemorrhoids (poor bowel prep)   HEMORRHOID BANDING N/A 05/20/2013   Procedure: HEMORRHOID BANDING;  Surgeon: Danie Binder, MD;  [facility];  Service: Endoscopy;  Laterality: N/A;   NASAL SEPTOPLASTY W/ TURBINOPLASTY Bilateral 05/31/2012   Procedure: NASAL SEPTOPLASTY WITH TURBINATE REDUCTION;  Surgeon: Ascencion Dike, MD;  Location: [facility];  Service: ENT;  Laterality: Bilateral;     Social History:  The patient  reports that she has been smoking cigarettes. She started smoking about 18 years ago. She has a 5.00 pack-year smoking history. She has never used smokeless tobacco. She reports that she does not drink alcohol and does not use drugs.   Family History:  The patient's family history includes ADD / ADHD in her brother and sister; Alcohol abuse in her father; Anxiety disorder in her brother; Bipolar disorder in her father and paternal grandmother; Dementia in her maternal grandmother and paternal grandmother; Depression in her mother; Emphysema in her paternal grandmother; Heart disease in her maternal grandmother and paternal grandmother; Hyperlipidemia in her mother; Hypertension in her mother; Personality disorder in her father; Physical abuse in her mother; Prostate cancer in her maternal grandfather; Seizures in her daughter; Sexual abuse in her mother; Sleep apnea in her mother; Stroke in her maternal grandfather and mother; Thyroid disease in her maternal grandfather.    ROS:  Please see the history of present illness. All other systems are reviewed and  Negative to the above problem except as  noted.    PHYSICAL EXAM: VS:  BP (!) 130/92   Pulse 98   Ht '5\' 5"'$  (1.651 m)   Wt 229 lb (103.9 kg)   SpO2 98%   BMI 38.11 kg/m   GEN: Morbidly obese 35 yo in no acute distress  HEENT: normal  Neck: JVP is normal  No carotid bruits Cardiac: RRR; no murmurs, rubs, or gallops,no edema  Respiratory:  clear to auscultation bilaterally, normal work of breathing GI: soft,  nontender, nondistended, + BS  No hepatomegaly  MS: no deformity Moving all extremities   Skin: warm and dry, no rash Neuro:  Strength and sensation are intact Psych: euthymic mood, full affect   EKG:  EKG is  ordered    NSR 90 bpm     Echo: 11/12/20  eft ventricular ejection fraction, by estimation, is 65 to 70%. The left ventricle has normal function. The left ventricle has no regional wall motion abnormalities. Left ventricular diastolic parameters were normal. 1. Right ventricular systolic function is normal. The right ventricular size is normal. There is normal pulmonary artery systolic pressure. 2. 3. Right atrial size was mildly dilated. 4. The mitral valve is normal in structure. Trivial mitral valve regurgitation. Diffcult to see aortic leaflets well. . The aortic valve is tricuspid. Aortic valve regurgitation is not visualized. Mild aortic valve sclerosis is present, with no evidence of aortic valve stenosis. 5. Acoustic window is difficult for accurate measurements of aortic root s/p coarctation repair (1989) Peak gradient down descending aorta is 19 mm Hg (previous echo from 2021, peak gradient was 22 mm Hg).. Aortic dilatation noted. There is moderate dilatation of the aortic root, measuring 43 mm. 6. The inferior vena cava is normal in size with greater than 50% respiratory variability, suggesting right atrial pressure of 3 mmHg.  MRI 2017  IMPRESSION: 1. Surgical changes of pre ductal aortic coarctation repair without evidence of complicating feature. 2. Mild dilatation of the aortic root  which measures in the range of 3.9- 4.1 cm. 3. No effacement of the sino-tubular junction or significant dilatation of the tubular portion of the ascending aorta which measures approximately 3.5 cm. 4. 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    Signed,   Criselda Peaches, MD    Lipid Panel    Component Value Date/Time   CHOL 175 12/30/2018 1007   TRIG 84 12/30/2018 1007   HDL 45 12/30/2018 1007   CHOLHDL 3.9 12/30/2018 1007   CHOLHDL 4.2 02/06/2007 0404   VLDL 14 02/06/2007 0404   LDLCALC 114 (H) 12/30/2018 1007      Wt Readings from Last 3 Encounters:  11/19/20 229 lb (103.9 kg)  10/19/20 225 lb (102.1 kg)  09/19/20 219 lb (99.3 kg)      ASSESSMENT AND PLAN:  1  Congenital heart dz s/p ASD/ VSD/coarc  Pt is doing well  Echo is relatively stable     WIll continue to follow   2  Aortic dilitation  Aorta remains relativley stable    Plan for MRI on next check  2  Palpitations Again, usually at night   Not destabilizing   Can go up on b blocker to 1.5 tabs per day   Follow symptoms   Follow BP  4   HTN   BP is minimally increased    I have asked her to get cuff and follow  Bring to next visit  5 Morbid obesity   Discussed diet   Needs to cut out sugar   Discussed TRE     F/U in 12 months       Current medicines are reviewed at length with the patient today.  The patient does not have concerns regarding medicines.  Signed, Dorris Carnes, MD  11/19/2020 11:44 PM    [facility] [place], [place], [place]  [address] Phone: [phone];  Fax: [phone]

## 2020-11-19 NOTE — Patient Instructions (Signed)
Medication Instructions:  Your physician has recommended you make the following change in your medication:  1-INCREASE Metoprolol 37.5 mg  (1 1/2 tablets) by mouth daily  *If you need a refill on your cardiac medications before your next appointment, please call your pharmacy*  Lab Work: If you have labs (blood work) drawn today and your tests are completely normal, you will receive your results only by: [place] (if you have MyChart) OR A paper copy in the mail If you have any lab test that is abnormal or we need to change your treatment, we will call you to review the results.  Testing/Procedures: None ordered today.  Follow-Up: At [facility], you and your health needs are our priority.  As part of our continuing mission to provide you with exceptional heart care, we have created designated Provider Care Teams.  These Care Teams include your primary Cardiologist (physician) and Advanced Practice Providers (APPs -  Physician Assistants and Nurse Practitioners) who all work together to provide you with the care you need, when you need it.  We recommend signing up for the patient portal called "MyChart".  Sign up information is provided on this After Visit Summary.  MyChart is used to connect with patients for Virtual Visits (Telemedicine).  Patients are able to view lab/test results, encounter notes, upcoming appointments, etc.  Non-urgent messages can be sent to your provider as well.   To learn more about what you can do with MyChart, go to [email or web address].    Your next appointment:   12 month(s)  The format for your next appointment:   In Person  Provider:   You may see Dorris Carnes, MD or one of the following Advanced Practice Providers on your designated Care Team:   Richardson Dopp, PA-C [place], [place]

## 2020-11-22 NOTE — Addendum Note (Signed)
Addended by: Levonne Hubert on: 11/22/2020 05:06 PM   Modules accepted: Orders

## 2020-12-31 ENCOUNTER — Telehealth (HOSPITAL_COMMUNITY): Payer: Self-pay

## 2020-12-31 DIAGNOSIS — F900 Attention-deficit hyperactivity disorder, predominantly inattentive type: Secondary | ICD-10-CM

## 2020-12-31 MED ORDER — LISDEXAMFETAMINE DIMESYLATE 40 MG PO CAPS: 40.0000 mg | ORAL_CAPSULE | Freq: Every day | ORAL | 0 refills | Status: DC

## 2020-12-31 NOTE — Telephone Encounter (Signed)
Done

## 2020-12-31 NOTE — Telephone Encounter (Addendum)
Patient called requesting a refill on her Vyvanse 40mg  to be sent to [facility] on [address]/[place]. Please review and advise. Thank you  NOTIFIED PATIENT - PT DID NOT PICK UP SO WRITER LVM

## 2021-01-04 ENCOUNTER — Other Ambulatory Visit: Payer: Self-pay

## 2021-01-04 ENCOUNTER — Ambulatory Visit (INDEPENDENT_AMBULATORY_CARE_PROVIDER_SITE_OTHER): Payer: Medicaid Other | Admitting: *Deleted

## 2021-01-04 VITALS — Wt 222.0 lb

## 2021-01-04 DIAGNOSIS — Z3042 Encounter for surveillance of injectable contraceptive: Secondary | ICD-10-CM | POA: Diagnosis not present

## 2021-01-04 MED ORDER — MEDROXYPROGESTERONE ACETATE 150 MG/ML IM SUSP
150.0000 mg | Freq: Once | INTRAMUSCULAR | Status: AC
  Administered 2021-01-04: 150 mg via INTRAMUSCULAR

## 2021-01-06 ENCOUNTER — Other Ambulatory Visit: Payer: Self-pay

## 2021-01-06 ENCOUNTER — Encounter (HOSPITAL_COMMUNITY): Payer: Self-pay

## 2021-01-06 ENCOUNTER — Ambulatory Visit (HOSPITAL_COMMUNITY)
Admission: RE | Admit: 2021-01-06 | Discharge: 2021-01-06 | Disposition: A | Discharge status: Home / Self Care | Payer: Medicaid Other | Source: Ambulatory Visit | Attending: Emergency Medicine | Admitting: Emergency Medicine

## 2021-01-06 VITALS — BP 134/76 | HR 99 | Temp 98.4°F | Resp 18

## 2021-01-06 DIAGNOSIS — S93492A Sprain of other ligament of left ankle, initial encounter: Secondary | ICD-10-CM

## 2021-01-06 NOTE — ED Provider Notes (Signed)
[place]    CSN: [id number] Arrival date & time: 01/06/21  1237      History   Chief Complaint Chief Complaint  Patient presents with   Appointment    1300   Ankle Pain    HPI Kristin Stephens is a 35 y.o. female.   Pt reports left ankle pain and swelling x 2 days. Denies any known trauma to ankle, states that she has long history of injuring this ankle, states she is never broken it however has been to multiple times and has been advised by her orthopedist that her left ankle is very weak.  Patient states she is never done physical therapy for this.  Patient states she wears high-heeled shoes for her job at work, patient is requesting a note that she can wear tennis shoes to work while healing.  Patient states she has not been keeping it elevated nor has she tried to wrap it with an Ace wrap, states she has tried some ibuprofen with some mild short-term pain relief.  Patient states she has been performing a lot of household tasks at home, including carrying laundry up and down the stairs to and from her basement laundry area, patient denies fall.  The history is provided by the patient.   Past Medical History:  Diagnosis Date   Abnormal Pap smear of cervix    age 69 with hx of cryotherapy   ADHD (attention deficit hyperactivity disorder)    Anxiety    Asthma    daily and prn inhalers   Bipolar affective disorder ([place])    COVID 05/2020   Dental crowns present    Depression    Deviated nasal septum 05/2012   Eczema    right leg   Esophageal reflux    Fibromyalgia    Heart murmur    Hypertension    IBS (irritable bowel syndrome)    Migraine headache    with aura   Nasal turbinate hypertrophy 05/2012   bilateral   OCD (obsessive compulsive disorder)    STD (sexually transmitted disease)    Hx of abnormal pap--age 63    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   TMJ syndrome     Patient Active Problem List   Diagnosis Date Noted   Intentional  overdose of drug in tablet form ([place]) 01/26/2017   Bipolar 1 disorder, depressed, severe ([place]) 01/26/2017   Bipolar 2 disorder ([place]) 05/16/2014   Internal hemorrhoids with complication 70/35/0093   Vasomotor rhinitis 04/15/2013   Gastritis without bleeding 03/10/2013   Anal fissure 03/10/2013   Obsessive compulsive disorder 03/07/2013   Fibromyalgia 02/06/2013   Detachment of glenoid labrum 01/13/2013   Difficulty in walking(719.7) 02/18/2012   Seasonal affective disorder ([place]) 11/25/2011   Asthma, intrinsic 08/28/2010   ATTENTION DEFICIT DISORDER, ADULT 01/19/2008   RHINITIS MEDICAMENTOSA 12/08/2007   ALLERGIC RHINITIS 12/08/2007   IBS 12/08/2007   ECZEMA 12/08/2007   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   Migraine headache 04/24/2007   Essential hypertension 04/24/2007   ASVD 04/24/2007   GERD 04/24/2007   VSD 04/24/2007   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    Past Surgical History:  Procedure Laterality Date   ASD AND VSD [place]  06/18/2006   CESAREAN SECTION  04/12/2009   COARCTATION OF AORTA REPAIR  1989   COLONOSCOPY N/A 05/20/2013   Procedure: COLONOSCOPY;  Surgeon: Danie Binder, MD;  [facility];  Service: Endoscopy;  Laterality: N/A;  115-moved to [place] notified pt   ESOPHAGOGASTRODUODENOSCOPY  04/04/2008     Normal esophagus/Mild patchy erythema in the antrum/ Normal duodenal bulb, normal small bowel biopsy   FLEXIBLE SIGMOIDOSCOPY  04/04/2008     Small internal hemorrhoids (poor bowel prep)   HEMORRHOID BANDING N/A 05/20/2013   Procedure: HEMORRHOID BANDING;  Surgeon: Danie Binder, MD;  [facility];  Service: Endoscopy;  Laterality: N/A;   NASAL SEPTOPLASTY W/ TURBINOPLASTY Bilateral 05/31/2012   Procedure: NASAL SEPTOPLASTY WITH TURBINATE REDUCTION;  Surgeon: Ascencion Dike, MD;  Location: [facility];  Service: ENT;  Laterality: Bilateral;    OB History     Gravida  1   Para  1    Term  1   Preterm      AB      Living  1      SAB      IAB      Ectopic      Multiple      Live Births               Home Medications    Prior to Admission medications   Medication Sig Start Date End Date Taking? Authorizing Provider  acetaminophen (TYLENOL) 500 MG tablet Take 1,000-2,000 mg by mouth every 6 (six) hours as needed for mild pain, moderate pain, fever or headache.    [provider]  albuterol (PROVENTIL HFA;VENTOLIN HFA) 108 (90 Base) MCG/ACT inhaler Inhale 1-2 puffs into the lungs every 6 (six) hours as needed for wheezing or shortness of breath.    [provider]  ARIPiprazole (ABILIFY) 15 MG tablet Take 1 tablet (15 mg total) by mouth daily. 11/08/20   Arfeen, Arlyce Harman, MD  FLUoxetine (PROZAC) 40 MG capsule Take 1 capsule (40 mg total) by mouth daily. 11/08/20   Arfeen, Arlyce Harman, MD  fluticasone (FLONASE) 50 MCG/ACT nasal spray USE 2 SPRAYS IN EACH NOSTRIL EVERY DAY Patient taking differently: Place 2 sprays into both nostrils daily. 09/21/17   Mikey Kirschner, MD  HYDROCORTISONE EX Apply 1 application topically as needed (ECZEMA).    [provider]  hydrOXYzine (ATARAX/VISTARIL) 25 MG tablet Take 1 tablet (25 mg total) by mouth daily as needed for anxiety. 11/08/20   Arfeen, Arlyce Harman, MD  ibuprofen (ADVIL) 200 MG tablet Take 200 mg by mouth every 6 (six) hours as needed for mild pain or headache.    [provider]  lamoTRIgine (LAMICTAL) 200 MG tablet Take 1 tablet (200 mg total) by mouth at bedtime. 11/08/20   Arfeen, Arlyce Harman, MD  lisdexamfetamine (VYVANSE) 40 MG capsule Take 1 capsule (40 mg total) by mouth daily. 12/31/20   Arfeen, Arlyce Harman, MD  loratadine (CLARITIN) 10 MG tablet Take 1 tablet by mouth daily as needed for allergies.    [provider]  medroxyPROGESTERone (DEPO-PROVERA) 150 MG/ML injection Inject 150 mg into the muscle every 3 (three) months.    [provider]  metoprolol succinate  (TOPROL-XL) 25 MG 24 hr tablet Take 1.5 tablets (37.5 mg total) by mouth daily. 11/19/20   [name], MD  omeprazole (PRILOSEC) 20 MG capsule Take 20 mg by mouth daily. 11/01/20   [provider]  traZODone (DESYREL) 50 MG tablet Take 1 tablet (50 mg total) by mouth at bedtime as needed for sleep. 11/03/19   Kathlee Nations, MD    Family History Family History  Problem Relation Age of Onset  Hyperlipidemia Mother    Hypertension Mother    Depression Mother    Physical abuse Mother    Sexual abuse Mother        possibly   Sleep apnea Mother    Stroke Mother    Emphysema Paternal Grandmother    Heart disease Paternal Grandmother    Bipolar disorder Paternal Grandmother    Dementia Paternal Grandmother    Heart disease Maternal Grandmother    Dementia Maternal Grandmother    Stroke Maternal Grandfather    Prostate cancer Maternal Grandfather    Thyroid disease Maternal Grandfather    Personality disorder Father    Bipolar disorder Father    Alcohol abuse Father    ADD / ADHD Sister    Anxiety disorder Brother    ADD / ADHD Brother    Seizures Daughter    Drug abuse Neg Hx    OCD Neg Hx    Paranoid behavior Neg Hx    Schizophrenia Neg Hx     Social History Social History   Tobacco Use   Smoking status: Every Day    Packs/day: 0.50    Years: 10.00    Pack years: 5.00    Types: Cigarettes    Start date: 02/27/2002   Smokeless tobacco: Never   Tobacco comments:    has cut back to less than 1/2 a pack and trying to quit  Vaping Use   Vaping Use: Never used  Substance Use Topics   Alcohol use: No    Alcohol/week: 0.0 standard drinks    Comment: recovering alcoholic 8 mos sober as of 04/15/13   Drug use: No     Allergies   Sulfonamide derivatives, Coconut flavor, and Coconut oil   Review of Systems Review of Systems Pertinent findings noted in history of present illness.    Physical Exam Triage Vital Signs ED Triage Vitals  Enc Vitals Group      BP 01/06/21 1252 134/76     Pulse Rate 01/06/21 1252 99     Resp 01/06/21 1252 18     Temp 01/06/21 1252 98.4 F (36.9 C)     Temp Source 01/06/21 1252 Oral     SpO2 01/06/21 1252 98 %     Weight --      Height --      Head Circumference --      Peak Flow --      Pain Score 01/06/21 1255 3     Pain Loc --      Pain Edu? --      Excl. in [place]? --    No data found.  Updated Vital Signs BP 134/76 (BP Location: Right Arm)   Pulse 99   Temp 98.4 F (36.9 C) (Oral)   Resp 18   SpO2 98%   Visual Acuity Right Eye Distance:   Left Eye Distance:   Bilateral Distance:    Right Eye Near:   Left Eye Near:    Bilateral Near:     Physical Exam Vitals and nursing note reviewed.  Constitutional:      Appearance: Normal appearance.  HENT:     Head: Normocephalic and atraumatic.     Right Ear: Tympanic membrane, ear canal and external ear normal.     Left Ear: Tympanic membrane, ear canal and external ear normal.     Nose: Nose normal.     Mouth/Throat:     Mouth: Mucous membranes are moist.     Pharynx: Oropharynx is  clear.  Eyes:     Extraocular Movements: Extraocular movements intact.     Conjunctiva/sclera: Conjunctivae normal.     Pupils: Pupils are equal, round, and reactive to light.  Cardiovascular:     Rate and Rhythm: Normal rate and regular rhythm.     Pulses: Normal pulses.     Heart sounds: Normal heart sounds.  Pulmonary:     Effort: Pulmonary effort is normal.     Breath sounds: Normal breath sounds.  Abdominal:     General: Abdomen is flat. Bowel sounds are normal.     Palpations: Abdomen is soft.  Musculoskeletal:        General: Normal range of motion.     Cervical back: Normal range of motion and neck supple.     Comments: Left lateral malleolus has diffuse swelling which is nontender to palpation, tenderness to palpation is appreciated at left fourth metatarsal.  Patient is exhibiting full active and passive range of motion but complains of increased  pain with passive inward rotation of left foot.  Skin:    General: Skin is warm and dry.  Neurological:     General: No focal deficit present.     Mental Status: She is alert and oriented to person, place, and time. Mental status is at baseline.  Psychiatric:        Mood and Affect: Mood normal.        Behavior: Behavior normal.     UC Treatments / Results  Labs (all labs ordered are listed, but only abnormal results are displayed) Labs Reviewed - No data to display  EKG   Radiology No results found.  Procedures Procedures (including critical care time)  Medications Ordered in UC Medications - No data to display  Initial Impression / Assessment and Plan / UC Course  I have reviewed the triage vital signs and the nursing notes.  Pertinent labs & imaging results that were available during my care of the patient were reviewed by me and considered in my medical decision making (see chart for details).     Based on physical exam findings I do not feel patient needs to have an x-ray of her left ankle, patient declined offer to have this done.  Will place patient in a stabilizing lace up ankle boot and I provided her with a note for work so that she can wear tennis shoes to work for the next 3 weeks. Patient verbalized understanding and agreement of plan as discussed.  All questions were addressed during visit.  Please see discharge instructions below for further details of plan.  Final Clinical Impressions(s) / UC Diagnoses   Final diagnoses:  Sprain of other ligament of left ankle, initial encounter     Discharge Instructions      Based on the history provided and my physical exam findings, I believe that you have strained your left ankle.  As we discussed you can practice physical therapy at home by using your toes to write the alphabet in the air, I recommend that you perform this with both feet on just with your left.  We have also placed you in a stabilizing ankle  boot, please wear it is much as possible.  Have also provided you with a note for work so that you can wear sneakers to work for the next 3 weeks.     ED Prescriptions   None    PDMP not reviewed this encounter.   Lynden Oxford Scales, PA-C 01/06/21 1409

## 2021-01-06 NOTE — Discharge Instructions (Addendum)
Based on the history provided and my physical exam findings, I believe that you have strained your left ankle.  As we discussed you can practice physical therapy at home by using your toes to write the alphabet in the air, I recommend that you perform this with both feet on just with your left.  We have also placed you in a stabilizing ankle boot, please wear it is much as possible.  Have also provided you with a note for work so that you can wear sneakers to work for the next 3 weeks.

## 2021-01-06 NOTE — ED Triage Notes (Signed)
Pt reports left ankle pain and swelling x 2 days. Denies any trauma.

## 2021-01-26 ENCOUNTER — Telehealth: Payer: Self-pay

## 2021-01-29 NOTE — Telephone Encounter (Signed)
Received another refill request for Omprazole 20mg .

## 2021-01-31 ENCOUNTER — Other Ambulatory Visit: Payer: Self-pay | Admitting: Gastroenterology

## 2021-01-31 MED ORDER — OMEPRAZOLE 20 MG PO CPDR: 20.0000 mg | DELAYED_RELEASE_CAPSULE | Freq: Every day | ORAL | 3 refills | Status: DC

## 2021-01-31 NOTE — Telephone Encounter (Signed)
Sending in a limited supply. Please let patient know she will need an OV for additional refills or have PCP fill this medication for her as she hasn't been seen since 2020.

## 2021-02-01 ENCOUNTER — Encounter: Payer: Self-pay | Admitting: Gastroenterology

## 2021-02-01 NOTE — Telephone Encounter (Signed)
Lmom for pt to call us back. 

## 2021-02-02 ENCOUNTER — Other Ambulatory Visit (HOSPITAL_COMMUNITY): Payer: Self-pay | Admitting: Psychiatry

## 2021-02-02 DIAGNOSIS — F900 Attention-deficit hyperactivity disorder, predominantly inattentive type: Secondary | ICD-10-CM

## 2021-02-04 ENCOUNTER — Telehealth (HOSPITAL_COMMUNITY): Payer: Self-pay | Admitting: *Deleted

## 2021-02-04 DIAGNOSIS — F900 Attention-deficit hyperactivity disorder, predominantly inattentive type: Secondary | ICD-10-CM

## 2021-02-04 MED ORDER — LISDEXAMFETAMINE DIMESYLATE 40 MG PO CAPS: 40.0000 mg | ORAL_CAPSULE | Freq: Every day | ORAL | 0 refills | Status: DC

## 2021-02-04 NOTE — Telephone Encounter (Signed)
Done

## 2021-02-04 NOTE — Telephone Encounter (Signed)
Pt requesting refill of Vyvanse 40 mg. Pt has an upcoming appointment on 02/06/21. Please review.

## 2021-02-05 ENCOUNTER — Telehealth (HOSPITAL_COMMUNITY): Payer: Self-pay

## 2021-02-05 DIAGNOSIS — F900 Attention-deficit hyperactivity disorder, predominantly inattentive type: Secondary | ICD-10-CM

## 2021-02-05 MED ORDER — LISDEXAMFETAMINE DIMESYLATE 40 MG PO CAPS: 40.0000 mg | ORAL_CAPSULE | Freq: Every day | ORAL | 0 refills | Status: DC

## 2021-02-05 NOTE — Telephone Encounter (Signed)
Patient called regarding her Vyvanse 40mg . It was sent to [facility] on [address] in [place]. Patient needs it resent to [facility] on [address] in [place] because she gets a discount there and is paying out of pocket. Please review and advise. Thank you

## 2021-02-05 NOTE — Telephone Encounter (Signed)
Done

## 2021-02-06 ENCOUNTER — Other Ambulatory Visit: Payer: Self-pay

## 2021-02-06 ENCOUNTER — Telehealth (HOSPITAL_BASED_OUTPATIENT_CLINIC_OR_DEPARTMENT_OTHER): Payer: Self-pay | Admitting: Psychiatry

## 2021-02-06 ENCOUNTER — Encounter (HOSPITAL_COMMUNITY): Payer: Self-pay | Admitting: Psychiatry

## 2021-02-06 DIAGNOSIS — F419 Anxiety disorder, unspecified: Secondary | ICD-10-CM

## 2021-02-06 DIAGNOSIS — F3131 Bipolar disorder, current episode depressed, mild: Secondary | ICD-10-CM

## 2021-02-06 DIAGNOSIS — F900 Attention-deficit hyperactivity disorder, predominantly inattentive type: Secondary | ICD-10-CM

## 2021-02-06 MED ORDER — LAMOTRIGINE 200 MG PO TABS: 200.0000 mg | ORAL_TABLET | Freq: Every day | ORAL | 2 refills | Status: DC

## 2021-02-06 MED ORDER — ARIPIPRAZOLE 15 MG PO TABS: 15.0000 mg | ORAL_TABLET | Freq: Every day | ORAL | 2 refills | Status: DC

## 2021-02-06 MED ORDER — FLUOXETINE HCL 40 MG PO CAPS: 40.0000 mg | ORAL_CAPSULE | Freq: Every day | ORAL | 2 refills | Status: DC

## 2021-02-06 NOTE — Telephone Encounter (Signed)
NOTIFIED PATIENT - LVM

## 2021-02-06 NOTE — Progress Notes (Signed)
Virtual Visit via Telephone Note  I connected with Kristin Stephens on 02/06/21 at  1:00 PM EST by telephone and verified that I am speaking with the correct person using two identifiers.  Location: Patient: home Provider: home office   I discussed the limitations, risks, security and privacy concerns of performing an evaluation and management service by telephone and the availability of in person appointments. I also discussed with the patient that there may be a patient responsible charge related to this service. The patient expressed understanding and agreed to proceed.   History of Present Illness: Patient is evaluated by phone session.  She is doing well on her medication.  Patient told she had a very good visit with her daughter when she had overnight stay with her in October.  Patient reported her job is going well.  Her relationship with the boyfriend is also going well.  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  She likes her job which is 40 hours a week.  She is working in an IT consultant.  She denies any tremors, shakes, EPS or any crying spells.  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  She denies any mania, impulsive behavior or crying spells.  She like to keep the current medication.  She is in touch with her daughter on a regular basis and try to visit her in person at least once a month.  Her appetite is okay.  Her weight is stable.  Her attention, concentration is good.  She takes Vyvanse Monday to Friday.  Patient recently had a visit to urgent care for her ankle sprain which is much better.   Past Psychiatric History: Reviewed. H/O mood swing, depression, anger, overdose and slashing her wrist.  Did IOP in 2016. Inpatient in October 2018. H/O ETOH. Took Adderall, Ativan, Gabapentin, Wellbutrin but limited response.       Psychiatric Specialty Exam: Physical Exam  Review of Systems  Weight 222 lb (100.7 kg).There is no height or  weight on file to calculate BMI.  General Appearance: NA  Eye Contact:  NA  Speech:  Clear and Coherent and Normal Rate  Volume:  Normal  Mood:  Euthymic  Affect:  NA  Thought Process:  Goal Directed  Orientation:  Full (Time, Place, and Person)  Thought Content:  Logical  Suicidal Thoughts:  No  Homicidal Thoughts:  No  Memory:  Immediate;   Good Recent;   Good Remote;   Good  Judgement:  Intact  Insight:  Present  Psychomotor Activity:  NA  Concentration:  Concentration: Good and Attention Span: Good  Recall:  Good  Fund of Knowledge:  Good  Language:  Good  Akathisia:  No  Handed:  Right  AIMS (if indicated):     Assets:  Communication Skills Desire for Improvement Housing Resilience Social Support Talents/Skills Transportation  ADL's:  Intact  Cognition:  WNL  Sleep:   ok      Assessment and Plan: Bipolar disorder type I.  ADD, inattentive type.  Anxiety.  Patient doing better on her medication.  She feels proud that she has been sober from drinking for more than 3 years.  She occasionally takes hydroxyzine when she is nervous otherwise she feels the Lamictal, Prozac and Abilify were working very well.  She is taking Vyvanse 40 mg Monday to Saturday.  [facility]  Discussed medication side effects and benefits.  Continue Prozac 40 mg daily, Abilify 15 mg daily, Lamictal 200 mg daily.  Patient does not need a new prescription of hydroxyzine.  She has not seen PCP in a while and like to be referred for annual physical checkup.  Follow-up in 3 months.  Follow Up Instructions:    I discussed the assessment and treatment plan with the patient. The patient was provided an opportunity to ask questions and all were answered. The patient agreed with the plan and demonstrated an understanding of the instructions.   The patient was advised to call back or seek an in-person evaluation if the symptoms worsen or if the  condition fails to improve as anticipated.  I provided 19 minutes of non-face-to-face time during this encounter.   Kathlee Nations, MD

## 2021-02-17 ENCOUNTER — Ambulatory Visit (INDEPENDENT_AMBULATORY_CARE_PROVIDER_SITE_OTHER): Payer: Self-pay

## 2021-02-17 ENCOUNTER — Encounter (HOSPITAL_COMMUNITY): Payer: Self-pay

## 2021-02-17 ENCOUNTER — Ambulatory Visit (HOSPITAL_COMMUNITY)
Admission: RE | Admit: 2021-02-17 | Discharge: 2021-02-17 | Disposition: A | Discharge status: Home / Self Care | Payer: Self-pay | Source: Ambulatory Visit | Attending: Student | Admitting: Student

## 2021-02-17 ENCOUNTER — Other Ambulatory Visit: Payer: Self-pay

## 2021-02-17 VITALS — BP 142/90 | HR 95 | Temp 98.9°F | Resp 19

## 2021-02-17 DIAGNOSIS — M25572 Pain in left ankle and joints of left foot: Secondary | ICD-10-CM

## 2021-02-17 DIAGNOSIS — S93402D Sprain of unspecified ligament of left ankle, subsequent encounter: Secondary | ICD-10-CM

## 2021-02-17 MED ORDER — PREDNISONE 20 MG PO TABS: 20.0000 mg | ORAL_TABLET | Freq: Every day | ORAL | 0 refills | Status: AC

## 2021-02-17 NOTE — ED Triage Notes (Signed)
Pt presents with c/o L ankle pain. States she had to wear a lace up brace and states the pain has worsened.

## 2021-02-17 NOTE — ED Provider Notes (Signed)
[facility]    CSN: [id number] Arrival date & time: 02/17/21  1040      History   Chief Complaint Chief Complaint  Patient presents with   Ankle Pain    HPI Kristin Stephens is a 35 y.o. female presenting with L ankle pain. Long history of L ankle pain, followed by ortho for repeated sprains, last visit with them few years ago. Last evaluated at our urgent care for this 01/06/21, at that time xray was deferred given lack of injury. States she did use the ASO brace, elevation, rest for 3 weeks, symptoms were slowly improving and so she stopped the brace.  Symptoms immediately returned, with ankle swelling, pain over the lateral aspect.  Symptoms do improve with swelling, but she is concerned other persistence.  Denies sensation changes.  Denies overuse or trauma.   HPI  Past Medical History:  Diagnosis Date   Abnormal Pap smear of cervix    age 33 with hx of cryotherapy   ADHD (attention deficit hyperactivity disorder)    Anxiety    Asthma    daily and prn inhalers   Bipolar affective disorder ([place])    COVID 05/2020   Dental crowns present    Depression    Deviated nasal septum 05/2012   Eczema    right leg   Esophageal reflux    Fibromyalgia    Heart murmur    Hypertension    IBS (irritable bowel syndrome)    Migraine headache    with aura   Nasal turbinate hypertrophy 05/2012   bilateral   OCD (obsessive compulsive disorder)    STD (sexually transmitted disease)    Hx of abnormal pap--age 66    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   TMJ syndrome     Patient Active Problem List   Diagnosis Date Noted   Intentional overdose of drug in tablet form ([place]) 01/26/2017   Bipolar 1 disorder, depressed, severe ([place]) 01/26/2017   Bipolar 2 disorder ([place]) 05/16/2014   Internal hemorrhoids with complication 38/12/1749   Vasomotor rhinitis 04/15/2013   Gastritis without bleeding 03/10/2013   Anal fissure 03/10/2013   Obsessive compulsive disorder  03/07/2013   Fibromyalgia 02/06/2013   Detachment of glenoid labrum 01/13/2013   Difficulty in walking(719.7) 02/18/2012   Seasonal affective disorder ([place]) 11/25/2011   Asthma, intrinsic 08/28/2010   ATTENTION DEFICIT DISORDER, ADULT 01/19/2008   RHINITIS MEDICAMENTOSA 12/08/2007   ALLERGIC RHINITIS 12/08/2007   IBS 12/08/2007   ECZEMA 12/08/2007   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   Migraine headache 04/24/2007   Essential hypertension 04/24/2007   ASVD 04/24/2007   GERD 04/24/2007   VSD 04/24/2007   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    Past Surgical History:  Procedure Laterality Date   ASD AND VSD [place]  06/18/2006   CESAREAN SECTION  04/12/2009   COARCTATION OF AORTA REPAIR  1989   COLONOSCOPY N/A 05/20/2013   Procedure: COLONOSCOPY;  Surgeon: Danie Binder, MD;  [facility];  Service: Endoscopy;  Laterality: N/A;  115-moved to [place] notified pt   ESOPHAGOGASTRODUODENOSCOPY  04/04/2008     Normal esophagus/Mild patchy erythema in the antrum/ Normal duodenal bulb, normal small bowel biopsy   FLEXIBLE SIGMOIDOSCOPY  04/04/2008     Small internal hemorrhoids (poor bowel prep)   HEMORRHOID BANDING N/A 05/20/2013   Procedure: HEMORRHOID BANDING;  Surgeon: Danie Binder, MD;  [facility];  Service: Endoscopy;  Laterality: N/A;   NASAL SEPTOPLASTY W/ TURBINOPLASTY Bilateral 05/31/2012   Procedure: NASAL SEPTOPLASTY WITH TURBINATE REDUCTION;  Surgeon: Ascencion Dike, MD;  Location: [facility];  Service: ENT;  Laterality: Bilateral;    OB History     Gravida  1   Para  1   Term  1   Preterm      AB      Living  1      SAB      IAB      Ectopic      Multiple      Live Births               Home Medications    Prior to Admission medications   Medication Sig Start Date End Date Taking? Authorizing Provider  predniSONE (DELTASONE) 20 MG tablet Take 1 tablet (20 mg total) by  mouth daily for 5 days. 02/17/21 02/22/21 Yes Hazel Sams, PA-C  acetaminophen (TYLENOL) 500 MG tablet Take 1,000-2,000 mg by mouth every 6 (six) hours as needed for mild pain, moderate pain, fever or headache.    [provider]  albuterol (PROVENTIL HFA;VENTOLIN HFA) 108 (90 Base) MCG/ACT inhaler Inhale 1-2 puffs into the lungs every 6 (six) hours as needed for wheezing or shortness of breath.    [provider]  ARIPiprazole (ABILIFY) 15 MG tablet Take 1 tablet (15 mg total) by mouth daily. 02/06/21   Arfeen, Arlyce Harman, MD  FLUoxetine (PROZAC) 40 MG capsule Take 1 capsule (40 mg total) by mouth daily. 02/06/21   Arfeen, Arlyce Harman, MD  fluticasone (FLONASE) 50 MCG/ACT nasal spray USE 2 SPRAYS IN EACH NOSTRIL EVERY DAY Patient taking differently: Place 2 sprays into both nostrils daily. 09/21/17   Mikey Kirschner, MD  HYDROCORTISONE EX Apply 1 application topically as needed (ECZEMA).    [provider]  hydrOXYzine (ATARAX/VISTARIL) 25 MG tablet Take 1 tablet (25 mg total) by mouth daily as needed for anxiety. 11/08/20   Arfeen, Arlyce Harman, MD  ibuprofen (ADVIL) 200 MG tablet Take 200 mg by mouth every 6 (six) hours as needed for mild pain or headache.    [provider]  lamoTRIgine (LAMICTAL) 200 MG tablet Take 1 tablet (200 mg total) by mouth at bedtime. 02/06/21   Arfeen, Arlyce Harman, MD  lisdexamfetamine (VYVANSE) 40 MG capsule Take 1 capsule (40 mg total) by mouth daily. 02/05/21   Arfeen, Arlyce Harman, MD  loratadine (CLARITIN) 10 MG tablet Take 1 tablet by mouth daily as needed for allergies.    [provider]  medroxyPROGESTERone (DEPO-PROVERA) 150 MG/ML injection Inject 150 mg into the muscle every 3 (three) months.    [provider]  metoprolol succinate (TOPROL-XL) 25 MG 24 hr tablet Take 1.5 tablets (37.5 mg total) by mouth daily. 11/19/20   [name], MD  omeprazole (PRILOSEC) 20 MG capsule Take 1 capsule (20 mg total) by mouth daily before  breakfast. 01/31/21   Erenest Rasher, PA-C  traZODone (DESYREL) 50 MG tablet Take 1 tablet (50 mg total) by mouth at bedtime as needed for sleep. Patient not taking: Reported on 02/06/2021 11/03/19   Kathlee Nations, MD    Family History Family History  Problem Relation Age of Onset   Hyperlipidemia Mother    Hypertension Mother    Depression Mother    Physical abuse Mother    Sexual abuse Mother        possibly  Sleep apnea Mother    Stroke Mother    Emphysema Paternal Grandmother    Heart disease Paternal Grandmother    Bipolar disorder Paternal Grandmother    Dementia Paternal Grandmother    Heart disease Maternal Grandmother    Dementia Maternal Grandmother    Stroke Maternal Grandfather    Prostate cancer Maternal Grandfather    Thyroid disease Maternal Grandfather    Personality disorder Father    Bipolar disorder Father    Alcohol abuse Father    ADD / ADHD Sister    Anxiety disorder Brother    ADD / ADHD Brother    Seizures Daughter    Drug abuse Neg Hx    OCD Neg Hx    Paranoid behavior Neg Hx    Schizophrenia Neg Hx     Social History Social History   Tobacco Use   Smoking status: Every Day    Packs/day: 0.50    Years: 10.00    Pack years: 5.00    Types: Cigarettes    Start date: 02/27/2002   Smokeless tobacco: Never   Tobacco comments:    has cut back to less than 1/2 a pack and trying to quit  Vaping Use   Vaping Use: Never used  Substance Use Topics   Alcohol use: No    Alcohol/week: 0.0 standard drinks    Comment: recovering alcoholic 8 mos sober as of 04/15/13   Drug use: No     Allergies   Sulfonamide derivatives, Coconut flavor, and Coconut oil   Review of Systems Review of Systems  Musculoskeletal:        L ankle pain  All other systems reviewed and are negative.   Physical Exam Triage Vital Signs ED Triage Vitals [02/17/21 1112]  Enc Vitals Group     BP      Pulse      Resp      Temp      Temp src      SpO2      Weight       Height      Head Circumference      Peak Flow      Pain Score 6     Pain Loc      Pain Edu?      Excl. in [place]?    No data found.  Updated Vital Signs BP (!) 142/90 (BP Location: Right Arm)   Pulse 95   Temp 98.9 F (37.2 C) (Oral)   Resp 19   SpO2 100%   Visual Acuity Right Eye Distance:   Left Eye Distance:   Bilateral Distance:    Right Eye Near:   Left Eye Near:    Bilateral Near:     Physical Exam Vitals reviewed.  Constitutional:      Appearance: Normal appearance.  Pulmonary:     Effort: Pulmonary effort is normal.  Musculoskeletal:     Comments: L ankle ankle: minimal effusion over lateral malleolus. Lateral malleolar tenderness. no other  changes, or bony abnormality. No medial malleolar tenderness.  No midfoot tenderness, no metatarsal tenderness of deformity. Sensation intact. ROM intact- plantarflexion, dorsiflexion, eversion, inversion. Pain with plantarflexion. DP 2+, cap refill <2 seconds. No proximal tibial or fibular tenderness. Ambulating with pain.  Skin:    Capillary Refill: Capillary refill takes less than 2 seconds.  Neurological:     General: No focal deficit present.     Mental Status: She is alert and oriented to person, place, and  time.  Psychiatric:        Mood and Affect: Mood normal.        Behavior: Behavior normal.        Thought Content: Thought content normal.        Judgment: Judgment normal.     UC Treatments / Results  Labs (all labs ordered are listed, but only abnormal results are displayed) Labs Reviewed - No data to display  EKG   Radiology DG Ankle Complete Left  Result Date: 02/17/2021 CLINICAL DATA:  Recurrent left ankle sprains 2 months left ankle pain. No recent injury. EXAM: LEFT ANKLE COMPLETE - 3+ VIEW COMPARISON:  None. FINDINGS: There is no evidence of fracture, dislocation, or joint effusion. There is no evidence of arthropathy or other focal bone abnormality. Soft tissues are unremarkable. IMPRESSION:  Negative. Electronically Signed   By: Marin Olp M.D.   On: 02/17/2021 11:31    Procedures Procedures (including critical care time)  Medications Ordered in UC Medications - No data to display  Initial Impression / Assessment and Plan / UC Course  I have reviewed the triage vital signs and the nursing notes.  Pertinent labs & imaging results that were available during my care of the patient were reviewed by me and considered in my medical decision making (see chart for details).     This patient is a very pleasant 35 y.o. year old female presenting with chronic L ankle sprain. Neurovascularly intact. Given symptoms x1 month, did check an xray; negative. Continue ASO brace, RICE. Trial of low-dose prednisone. F/u with ortho, information provided.   Final Clinical Impressions(s) / UC Diagnoses   Final diagnoses:  Sprain of left ankle, unspecified ligament, subsequent encounter     Discharge Instructions      -Prednisone one pill taken in the morning x5 days -You can take Tylenol up to 1000 mg 3 times daily, and ibuprofen up to 600 mg 3 times daily with food.  You can take these together, or alternate every 3-4 hours. -Rest, ice, elevation, brace -Follow-up with an orthopedist. You could call your old doctor. Alternatively, I recommend [facility] at [address], [place],  [address]. You can schedule an appointment by calling [phone]) or online ([email or web address]), but they also have a walk-in clinic M-F 8a-8p and Sat 10a-3p.      ED Prescriptions     Medication Sig Dispense Auth. Provider   predniSONE (DELTASONE) 20 MG tablet Take 1 tablet (20 mg total) by mouth daily for 5 days. 5 tablet Hazel Sams, PA-C      PDMP not reviewed this encounter.   Hazel Sams, PA-C 02/17/21 1157

## 2021-02-17 NOTE — Discharge Instructions (Addendum)
-  Prednisone one pill taken in the morning x5 days -You can take Tylenol up to 1000 mg 3 times daily, and ibuprofen up to 600 mg 3 times daily with food.  You can take these together, or alternate every 3-4 hours. -Rest, ice, elevation, brace -Follow-up with an orthopedist. You could call your old doctor. Alternatively, I recommend [facility] at [address] [place], [place] [address]. You can schedule an appointment by calling [phone]) or online ([email or web address]), but they also have a walk-in clinic M-F 8a-8p and Sat 10a-3p.

## 2021-02-20 ENCOUNTER — Encounter: Payer: Self-pay | Admitting: *Deleted

## 2021-02-20 NOTE — Telephone Encounter (Signed)
Tried calling pt several times. Will mail a letter.

## 2021-02-20 NOTE — Telephone Encounter (Signed)
Noted  

## 2021-02-28 ENCOUNTER — Telehealth (HOSPITAL_COMMUNITY): Payer: Self-pay | Admitting: *Deleted

## 2021-02-28 DIAGNOSIS — F900 Attention-deficit hyperactivity disorder, predominantly inattentive type: Secondary | ICD-10-CM

## 2021-02-28 NOTE — Telephone Encounter (Signed)
Pt called requesting refill of the Vyvanse 40 mg last filled 02/05/21. Pt states she knows it's not due to fill until next week but would like new Rx sent to [facility] today. I have asked pt previously to wait until closer to fill date to call. Pt next appointment scheduled for 05/02/21. Please review.

## 2021-03-04 ENCOUNTER — Other Ambulatory Visit (HOSPITAL_COMMUNITY): Payer: Self-pay | Admitting: Psychiatry

## 2021-03-04 DIAGNOSIS — F900 Attention-deficit hyperactivity disorder, predominantly inattentive type: Secondary | ICD-10-CM

## 2021-03-04 MED ORDER — LISDEXAMFETAMINE DIMESYLATE 40 MG PO CAPS: 40.0000 mg | ORAL_CAPSULE | Freq: Every day | ORAL | 0 refills | Status: DC

## 2021-03-04 NOTE — Telephone Encounter (Signed)
Done

## 2021-03-18 ENCOUNTER — Ambulatory Visit (HOSPITAL_COMMUNITY): Payer: Self-pay

## 2021-03-26 ENCOUNTER — Ambulatory Visit (INDEPENDENT_AMBULATORY_CARE_PROVIDER_SITE_OTHER): Payer: Medicaid Other

## 2021-03-26 ENCOUNTER — Other Ambulatory Visit: Payer: Self-pay

## 2021-03-26 DIAGNOSIS — Z3042 Encounter for surveillance of injectable contraceptive: Secondary | ICD-10-CM | POA: Diagnosis not present

## 2021-03-26 MED ORDER — MEDROXYPROGESTERONE ACETATE 150 MG/ML IM SUSP
150.0000 mg | Freq: Once | INTRAMUSCULAR | Status: AC
  Administered 2021-03-26: 09:00:00 150 mg via INTRAMUSCULAR

## 2021-03-26 NOTE — Progress Notes (Signed)
Patient is here for Depo Provera Injection Patient is within Depo Provera Calender Limits yes last given 01/04/21 Next Depo Due between: 3/14/-3/28 Last AEX: 09/19/20 AEX Scheduled: 09/25/21  Patient is aware when next depo is due  Pt tolerated Injection well in [place] Routed to provider for review, encounter closed.

## 2021-03-27 ENCOUNTER — Ambulatory Visit
Admission: RE | Admit: 2021-03-27 | Discharge: 2021-03-27 | Disposition: A | Discharge status: Home / Self Care | Payer: Medicaid Other | Source: Ambulatory Visit

## 2021-03-27 VITALS — BP 121/79 | HR 90 | Temp 98.6°F | Resp 20

## 2021-03-27 DIAGNOSIS — R058 Other specified cough: Secondary | ICD-10-CM

## 2021-03-27 DIAGNOSIS — F17210 Nicotine dependence, cigarettes, uncomplicated: Secondary | ICD-10-CM

## 2021-03-27 MED ORDER — ALBUTEROL SULFATE HFA 108 (90 BASE) MCG/ACT IN AERS: 2.0000 | INHALATION_SPRAY | Freq: Four times a day (QID) | RESPIRATORY_TRACT | 0 refills | Status: DC | PRN

## 2021-03-27 MED ORDER — AMOXICILLIN-POT CLAVULANATE 875-125 MG PO TABS: 1.0000 | ORAL_TABLET | Freq: Two times a day (BID) | ORAL | 0 refills | Status: AC

## 2021-03-27 MED ORDER — METHYLPREDNISOLONE 4 MG PO TBPK: ORAL_TABLET | ORAL | 0 refills | Status: DC

## 2021-03-27 NOTE — Discharge Instructions (Signed)
Please begin Augmentin and Medrol Dosepak.  If you have not had relief of your symptoms in the next 3 to 5 days, please follow-up with your primary care provider for further evaluation.

## 2021-03-27 NOTE — ED Provider Notes (Signed)
[facility]    CSN: [id number] Arrival date & time: 03/27/21  1416    HISTORY  No chief complaint on file.  HPI Kristin Stephens is a 35 y.o. female. Pt reports feeling like she has the flu, she c/o congestion, cough and hoarse voice that began about 3 weeks ago.  Patient states has been taking Mucinex and using Flonase along with DayQuil, NyQuil.  Patient states she continues to smoke cigarettes.  Patient denies nausea, vomiting, diarrhea.  Patient states cough is productive of dark-colored sputum.  The history is provided by the patient.  Past Medical History:  Diagnosis Date   Abnormal Pap smear of cervix    age 5 with hx of cryotherapy   ADHD (attention deficit hyperactivity disorder)    Anxiety    Asthma    daily and prn inhalers   Bipolar affective disorder ([place])    COVID 05/2020   Dental crowns present    Depression    Deviated nasal septum 05/2012   Eczema    right leg   Esophageal reflux    Fibromyalgia    Heart murmur    Hypertension    IBS (irritable bowel syndrome)    Migraine headache    with aura   Nasal turbinate hypertrophy 05/2012   bilateral   OCD (obsessive compulsive disorder)    STD (sexually transmitted disease)    Hx of abnormal pap--age 48    Substance abuse ([place]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   TMJ syndrome    Patient Active Problem List   Diagnosis Date Noted   Intentional overdose of drug in tablet form ([place]) 01/26/2017   Bipolar 1 disorder, depressed, severe ([place]) 01/26/2017   Bipolar 2 disorder ([place]) 05/16/2014   Internal hemorrhoids with complication 09/47/0962   Vasomotor rhinitis 04/15/2013   Gastritis without bleeding 03/10/2013   Anal fissure 03/10/2013   Obsessive compulsive disorder 03/07/2013   Fibromyalgia 02/06/2013   Detachment of glenoid labrum 01/13/2013   Difficulty in walking(719.7) 02/18/2012   Seasonal affective disorder ([place]) 11/25/2011   Asthma, intrinsic 08/28/2010   ATTENTION DEFICIT DISORDER, ADULT  01/19/2008   RHINITIS MEDICAMENTOSA 12/08/2007   ALLERGIC RHINITIS 12/08/2007   IBS 12/08/2007   ECZEMA 12/08/2007   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   Migraine headache 04/24/2007   Essential hypertension 04/24/2007   ASVD 04/24/2007   GERD 04/24/2007   VSD 04/24/2007   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   Past Surgical History:  Procedure Laterality Date   ASD AND VSD [place]  06/18/2006   CESAREAN SECTION  04/12/2009   COARCTATION OF AORTA REPAIR  1989   COLONOSCOPY N/A 05/20/2013   Procedure: COLONOSCOPY;  Surgeon: Danie Binder, MD;  [facility];  Service: Endoscopy;  Laterality: N/A;  115-moved to [place] notified pt   ESOPHAGOGASTRODUODENOSCOPY  04/04/2008     Normal esophagus/Mild patchy erythema in the antrum/ Normal duodenal bulb, normal small bowel biopsy   FLEXIBLE SIGMOIDOSCOPY  04/04/2008     Small internal hemorrhoids (poor bowel prep)   HEMORRHOID BANDING N/A 05/20/2013   Procedure: HEMORRHOID BANDING;  Surgeon: Danie Binder, MD;  [facility];  Service: Endoscopy;  Laterality: N/A;   NASAL SEPTOPLASTY W/ TURBINOPLASTY Bilateral 05/31/2012   Procedure: NASAL SEPTOPLASTY WITH TURBINATE REDUCTION;  Surgeon: Ascencion Dike, MD;  Location: [place];  Service: ENT;  Laterality: Bilateral;   OB History     Durenda Age  1   Para  1   Term  1   Preterm      AB      Living  1      SAB      IAB      Ectopic      Multiple      Live Births             Home Medications    Prior to Admission medications   Medication Sig Start Date End Date Taking? Authorizing Provider  aspirin 81 MG chewable tablet Chew by mouth daily.   Yes [provider]  acetaminophen (TYLENOL) 500 MG tablet Take 1,000-2,000 mg by mouth every 6 (six) hours as needed for mild pain, moderate pain, fever or headache.    [provider]  albuterol (PROVENTIL HFA;VENTOLIN HFA) 108 (90 Base)  MCG/ACT inhaler Inhale 1-2 puffs into the lungs every 6 (six) hours as needed for wheezing or shortness of breath.    [provider]  ARIPiprazole (ABILIFY) 15 MG tablet Take 1 tablet (15 mg total) by mouth daily. 02/06/21   Arfeen, Arlyce Harman, MD  FLUoxetine (PROZAC) 40 MG capsule Take 1 capsule (40 mg total) by mouth daily. 02/06/21   Arfeen, Arlyce Harman, MD  fluticasone (FLONASE) 50 MCG/ACT nasal spray USE 2 SPRAYS IN EACH NOSTRIL EVERY DAY Patient taking differently: Place 2 sprays into both nostrils daily. 09/21/17   Mikey Kirschner, MD  HYDROCORTISONE EX Apply 1 application topically as needed (ECZEMA).    [provider]  hydrOXYzine (ATARAX/VISTARIL) 25 MG tablet Take 1 tablet (25 mg total) by mouth daily as needed for anxiety. 11/08/20   Arfeen, Arlyce Harman, MD  ibuprofen (ADVIL) 200 MG tablet Take 200 mg by mouth every 6 (six) hours as needed for mild pain or headache.    [provider]  lamoTRIgine (LAMICTAL) 200 MG tablet Take 1 tablet (200 mg total) by mouth at bedtime. 02/06/21   Arfeen, Arlyce Harman, MD  lisdexamfetamine (VYVANSE) 40 MG capsule Take 1 capsule (40 mg total) by mouth daily. 03/04/21   Arfeen, Arlyce Harman, MD  loratadine (CLARITIN) 10 MG tablet Take 1 tablet by mouth daily as needed for allergies.    [provider]  medroxyPROGESTERone (DEPO-PROVERA) 150 MG/ML injection Inject 150 mg into the muscle every 3 (three) months.    [provider]  metoprolol succinate (TOPROL-XL) 25 MG 24 hr tablet Take 1.5 tablets (37.5 mg total) by mouth daily. 11/19/20   [name], MD  omeprazole (PRILOSEC) 20 MG capsule Take 1 capsule (20 mg total) by mouth daily before breakfast. 01/31/21   Erenest Rasher, PA-C  traZODone (DESYREL) 50 MG tablet Take 1 tablet (50 mg total) by mouth at bedtime as needed for sleep. Patient not taking: Reported on 02/06/2021 11/03/19   Arfeen, Arlyce Harman, MD   Family History Family History  Problem Relation Age of Onset   Hyperlipidemia  Mother    Hypertension Mother    Depression Mother    Physical abuse Mother    Sexual abuse Mother        possibly   Sleep apnea Mother    Stroke Mother    Emphysema Paternal Grandmother    Heart disease Paternal Grandmother    Bipolar disorder Paternal Grandmother    Dementia Paternal Grandmother    Heart disease Maternal Grandmother    Dementia Maternal Grandmother    Stroke Maternal Grandfather    Prostate cancer Maternal Grandfather  Thyroid disease Maternal Grandfather    Personality disorder Father    Bipolar disorder Father    Alcohol abuse Father    ADD / ADHD Sister    Anxiety disorder Brother    ADD / ADHD Brother    Seizures Daughter    Drug abuse Neg Hx    OCD Neg Hx    Paranoid behavior Neg Hx    Schizophrenia Neg Hx    Social History Social History   Tobacco Use   Smoking status: Every Day    Packs/day: 0.50    Years: 10.00    Pack years: 5.00    Types: Cigarettes    Start date: 02/27/2002   Smokeless tobacco: Never   Tobacco comments:    has cut back to less than 1/2 a pack and trying to quit  Vaping Use   Vaping Use: Never used  Substance Use Topics   Alcohol use: No    Alcohol/week: 0.0 standard drinks    Comment: recovering alcoholic 8 mos sober as of 04/15/13   Drug use: No   Allergies   Sulfonamide derivatives, Coconut flavor, and Coconut oil  Review of Systems Review of Systems Pertinent findings noted in history of present illness.   Physical Exam Triage Vital Signs ED Triage Vitals  Enc Vitals Group     BP 01/25/21 0827 (!) 147/82     Pulse Rate 01/25/21 0827 72     Resp 01/25/21 0827 18     Temp 01/25/21 0827 98.3 F (36.8 C)     Temp Source 01/25/21 0827 Oral     SpO2 01/25/21 0827 98 %     Weight --      Height --      Head Circumference --      Peak Flow --      Pain Score 01/25/21 0826 5     Pain Loc --      Pain Edu? --      Excl. in [place]? --   No data found.  Updated Vital Signs BP 121/79 (BP Location: Right  Arm)    Pulse 90    Temp 98.6 F (37 C) (Oral)    Resp 20    SpO2 97%   Physical Exam Vitals and nursing note reviewed.  Constitutional:      General: She is not in acute distress.    Appearance: Normal appearance. She is obese. She is not ill-appearing.  HENT:     Head: Normocephalic and atraumatic.     Salivary Glands: Right salivary gland is not diffusely enlarged or tender. Left salivary gland is not diffusely enlarged or tender.     Right Ear: Tympanic membrane, ear canal and external ear normal. No drainage. No middle ear effusion. There is no impacted cerumen. Tympanic membrane is not erythematous or bulging.     Left Ear: Tympanic membrane, ear canal and external ear normal. No drainage.  No middle ear effusion. There is no impacted cerumen. Tympanic membrane is not erythematous or bulging.     Nose: Nose normal. No nasal deformity, septal deviation, mucosal edema, congestion or rhinorrhea.     Right Turbinates: Not enlarged, swollen or pale.     Left Turbinates: Not enlarged, swollen or pale.     Right Sinus: No maxillary sinus tenderness or frontal sinus tenderness.     Left Sinus: No maxillary sinus tenderness or frontal sinus tenderness.     Mouth/Throat:     Lips: Pink. No lesions.  Mouth: Mucous membranes are moist. No oral lesions.     Pharynx: Oropharynx is clear. Uvula midline. No posterior oropharyngeal erythema or uvula swelling.     Tonsils: No tonsillar exudate. 0 on the right. 0 on the left.  Eyes:     General: Lids are normal.        Right eye: No discharge.        Left eye: No discharge.     Extraocular Movements: Extraocular movements intact.     Conjunctiva/sclera: Conjunctivae normal.     Right eye: Right conjunctiva is not injected.     Left eye: Left conjunctiva is not injected.  Neck:     Trachea: Trachea and phonation normal.  Cardiovascular:     Rate and Rhythm: Normal rate and regular rhythm.     Pulses: Normal pulses.     Heart sounds: Normal  heart sounds. No murmur heard.   No friction rub. No gallop.  Pulmonary:     Effort: Pulmonary effort is normal. No accessory muscle usage, prolonged expiration or respiratory distress.     Breath sounds: No stridor, decreased air movement or transmitted upper airway sounds. Examination of the right-upper field reveals rales. Examination of the left-upper field reveals rales. Examination of the right-middle field reveals wheezing and rales. Examination of the left-middle field reveals wheezing and rales. Examination of the right-lower field reveals rales. Examination of the left-lower field reveals wheezing and rales. Wheezing and rales present. No decreased breath sounds or rhonchi.  Chest:     Chest wall: No tenderness.  Musculoskeletal:        General: Normal range of motion.     Cervical back: Normal range of motion and neck supple. Normal range of motion.  Lymphadenopathy:     Cervical: Cervical adenopathy present.     Right cervical: Superficial cervical adenopathy present.     Left cervical: Superficial cervical adenopathy present.  Skin:    General: Skin is warm and dry.     Findings: No erythema or rash.  Neurological:     General: No focal deficit present.     Mental Status: She is alert and oriented to person, place, and time.  Psychiatric:        Mood and Affect: Mood normal.        Behavior: Behavior normal.    Visual Acuity Right Eye Distance:   Left Eye Distance:   Bilateral Distance:    Right Eye Near:   Left Eye Near:    Bilateral Near:     UC Couse / Diagnostics / Procedures:    EKG  Radiology No results found.  Procedures Procedures (including critical care time)  UC Diagnoses / Final Clinical Impressions(s)   I have reviewed the triage vital signs and the nursing notes.  Pertinent labs & imaging results that were available during my care of the patient were reviewed by me and considered in my medical decision making (see chart for details).   Final  diagnoses:  Cough present for greater than 3 weeks  Tobacco dependence due to cigarettes   Physical exam findings concerning for possible bacterial superinfection, wheezing.  Smoking cessation advised.  Patient advised to continue albuterol inhaler, renewal provided.  Patient also advised to begin Medrol Dosepak and a 7-day course of Augmentin.  ED Prescriptions     Medication Sig Dispense Auth. Provider   amoxicillin-clavulanate (AUGMENTIN) 875-125 MG tablet Take 1 tablet by mouth 2 (two) times daily for 7 days. 14 tablet Lynden Oxford  Scales, PA-C   methylPREDNISolone (MEDROL DOSEPAK) 4 MG TBPK tablet Take 24 mg on day 1, 20 mg on day 2, 16 mg on day 3, 12 mg on day 4, 8 mg on day 5, 4 mg on day 6. 21 tablet Lynden Oxford Scales, PA-C   albuterol (VENTOLIN HFA) 108 (90 Base) MCG/ACT inhaler Inhale 2 puffs into the lungs every 6 (six) hours as needed for wheezing or shortness of breath (Cough). 18 g Lynden Oxford Scales, PA-C      PDMP not reviewed this encounter.  Pending results:  Labs Reviewed - No data to display  Medications Ordered in UC: Medications - No data to display  Disposition Upon Discharge:  Condition: stable for discharge home Home: take medications as prescribed; routine discharge instructions as discussed; follow up as advised.  Patient presented with an acute illness with associated systemic symptoms and significant discomfort requiring urgent management. In my opinion, this is a condition that a prudent lay person (someone who possesses an average knowledge of health and medicine) may potentially expect to result in complications if not addressed urgently such as respiratory distress, impairment of bodily function or dysfunction of bodily organs.   Routine symptom specific, illness specific and/or disease specific instructions were discussed with the patient and/or caregiver at length.   As such, the patient has been evaluated and assessed, work-up was  performed and treatment was provided in alignment with urgent care protocols and evidence based medicine.  Patient/parent/caregiver has been advised that the patient may require follow up for further testing and treatment if the symptoms continue in spite of treatment, as clinically indicated and appropriate.  If the patient was tested for COVID-19, Influenza and/or RSV, then the patient/parent/guardian was advised to isolate at home pending the results of his/her diagnostic coronavirus test and potentially longer if theyre positive. I have also advised pt that if his/her COVID-19 test returns positive, it's recommended to self-isolate for at least 10 days after symptoms first appeared AND until fever-free for 24 hours without fever reducer AND other symptoms have improved or resolved. Discussed self-isolation recommendations as well as instructions for household member/close contacts as per the [facility] and [place] [facility], and also gave patient the [place] packet with this information.  Patient/parent/caregiver has been advised to return to the [facility] or PCP in 3-5 days if no better; to PCP or the Emergency Department if new signs and symptoms develop, or if the current signs or symptoms continue to change or worsen for further workup, evaluation and treatment as clinically indicated and appropriate  The patient will follow up with their current PCP if and as advised. If the patient does not currently have a PCP we will assist them in obtaining one.   The patient may need specialty follow up if the symptoms continue, in spite of conservative treatment and management, for further workup, evaluation, consultation and treatment as clinically indicated and appropriate.  Patient/parent/caregiver verbalized understanding and agreement of plan as discussed.  All questions were addressed during visit.  Please see discharge instructions below for further details of plan.  Discharge Instructions:   Discharge Instructions       Please begin Augmentin and Medrol Dosepak.  If you have not had relief of your symptoms in the next 3 to 5 days, please follow-up with your primary care provider for further evaluation.      This office note has been dictated using Museum/gallery curator.  Unfortunately, and despite my best efforts, this method of dictation  can sometimes lead to occasional typographical or grammatical errors.  I apologize in advance if this occurs.     Lynden Oxford Scales, [place] 03/27/21 [phone]

## 2021-03-27 NOTE — ED Triage Notes (Signed)
Pt reports feeling like she has the flu, she c/o congestion, cough and hoarse voice that began about 2 weeks ago.

## 2021-04-02 ENCOUNTER — Telehealth (HOSPITAL_COMMUNITY): Payer: Self-pay

## 2021-04-02 DIAGNOSIS — F900 Attention-deficit hyperactivity disorder, predominantly inattentive type: Secondary | ICD-10-CM

## 2021-04-02 MED ORDER — LISDEXAMFETAMINE DIMESYLATE 40 MG PO CAPS: 40.0000 mg | ORAL_CAPSULE | Freq: Every day | ORAL | 0 refills | Status: DC

## 2021-04-02 NOTE — Telephone Encounter (Signed)
Patient called reqeusting a refill on her Vyvanse 40mg  ro be sent to [facility] on [address] in [place]. Please review and advise. Thank you

## 2021-04-02 NOTE — Telephone Encounter (Signed)
Done

## 2021-04-04 NOTE — Telephone Encounter (Signed)
NOTIFIED PATIENT - PATIENT DID NOT PICK UP SO WRITER LVM

## 2021-04-29 ENCOUNTER — Telehealth (HOSPITAL_COMMUNITY): Payer: Self-pay

## 2021-04-29 DIAGNOSIS — F900 Attention-deficit hyperactivity disorder, predominantly inattentive type: Secondary | ICD-10-CM

## 2021-04-29 MED ORDER — LISDEXAMFETAMINE DIMESYLATE 40 MG PO CAPS: 40.0000 mg | ORAL_CAPSULE | Freq: Every day | ORAL | 0 refills | Status: DC

## 2021-04-29 NOTE — Telephone Encounter (Signed)
Patient called requesting a refill on her Vyvanse 40mg  to go to [facility] on [address] in [place]. Pt has followup appointment scheduled for 2/8. Please review and advise. Thank you

## 2021-04-29 NOTE — Telephone Encounter (Signed)
Refill send. She can pick up on her due date.

## 2021-05-01 ENCOUNTER — Other Ambulatory Visit (HOSPITAL_COMMUNITY): Payer: Self-pay | Admitting: Orthopedic Surgery

## 2021-05-01 DIAGNOSIS — M25572 Pain in left ankle and joints of left foot: Secondary | ICD-10-CM

## 2021-05-03 NOTE — Telephone Encounter (Signed)
Notified Patient - LVM

## 2021-05-08 ENCOUNTER — Encounter (HOSPITAL_COMMUNITY): Payer: Self-pay | Admitting: Psychiatry

## 2021-05-08 ENCOUNTER — Other Ambulatory Visit: Payer: Self-pay

## 2021-05-08 ENCOUNTER — Telehealth (HOSPITAL_BASED_OUTPATIENT_CLINIC_OR_DEPARTMENT_OTHER): Payer: Self-pay | Admitting: Psychiatry

## 2021-05-08 DIAGNOSIS — F3131 Bipolar disorder, current episode depressed, mild: Secondary | ICD-10-CM

## 2021-05-08 DIAGNOSIS — F900 Attention-deficit hyperactivity disorder, predominantly inattentive type: Secondary | ICD-10-CM

## 2021-05-08 DIAGNOSIS — F419 Anxiety disorder, unspecified: Secondary | ICD-10-CM

## 2021-05-08 MED ORDER — ARIPIPRAZOLE 15 MG PO TABS: 15.0000 mg | ORAL_TABLET | Freq: Every day | ORAL | 2 refills | Status: DC

## 2021-05-08 MED ORDER — LAMOTRIGINE 200 MG PO TABS: 200.0000 mg | ORAL_TABLET | Freq: Every day | ORAL | 2 refills | Status: DC

## 2021-05-08 MED ORDER — FLUOXETINE HCL 40 MG PO CAPS: 40.0000 mg | ORAL_CAPSULE | Freq: Every day | ORAL | 2 refills | Status: DC

## 2021-05-08 MED ORDER — HYDROXYZINE HCL 25 MG PO TABS: 25.0000 mg | ORAL_TABLET | Freq: Every day | ORAL | 0 refills | Status: DC | PRN

## 2021-05-08 MED ORDER — LISDEXAMFETAMINE DIMESYLATE 40 MG PO CAPS: 40.0000 mg | ORAL_CAPSULE | Freq: Every day | ORAL | 0 refills | Status: DC

## 2021-05-08 NOTE — Progress Notes (Signed)
Virtual Visit via Video Note  I connected with Kristin Stephens on 05/08/21 at  2:00 PM EST by a video enabled telemedicine application and verified that I am speaking with the correct person using two identifiers.  Location: Patient: In Car Provider: Home Office   I discussed the limitations of evaluation and management by telemedicine and the availability of in person appointments. The patient expressed understanding and agreed to proceed.  History of Present Illness: Patient is evaluated by a video session.  She is in the car and at work while on break.  She reported things are going well and she had a best Christmas after a long time.  She was able to see her mother and other family members and she is happy that there were no drama.  She feels a current medicine is working however 2 weeks ago she had bouts of depression and she was feeling sad but did not have any suicidal thoughts.  She is sad because her left leg/ankle reinjured and she started to have pain again.  She is wearing boot and may require surgery.  She has upcoming MRI in few days.  She admitted feeling anxious and nervous but no paranoia, hallucination or any anger.  Her job is going well.  She is working 80-85 hours in 2 weeks.  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  She had a good visit with her daughter Minna Merritts and she is feeling good about her daughter.  Patient also reported had a good relationship with her boyfriend Richard.  She denies any anger, mania, psychosis.  Her attention and concentration is good.  She takes Vyvanse Monday through Friday which works very well for her focus and attention.  Her appetite is okay.  Her sleep is good.  Her weight is stable.  She denies any panic attack.    Past Psychiatric History: Reviewed. H/O mood swing, depression, anger, overdose and slashing her wrist.  Did IOP in 2016. Inpatient in October 2018. H/O ETOH. Took Adderall, Ativan, Gabapentin, Wellbutrin but limited  response.     Psychiatric Specialty Exam: Physical Exam  Review of Systems  Musculoskeletal:        Left ankle pain. Wearing boot   Weight 220 lb (99.8 kg).There is no height or weight on file to calculate BMI.  General Appearance: Casual  Eye Contact:  Good  Speech:  Clear and Coherent  Volume:  Normal  Mood:  Euthymic  Affect:  Appropriate  Thought Process:  Goal Directed  Orientation:  Full (Time, Place, and Person)  Thought Content:  Logical  Suicidal Thoughts:  No  Homicidal Thoughts:  No  Memory:  Immediate;   Good Recent;   Good Remote;   Good  Judgement:  Good  Insight:  Present  Psychomotor Activity:  Normal  Concentration:  Concentration: Good and Attention Span: Good  Recall:  Good  Fund of Knowledge:  Good  Language:  Good  Akathisia:  No  Handed:  Right  AIMS (if indicated):     Assets:  Communication Skills Desire for [employer]/Skills Transportation  ADL's:  Intact  Cognition:  WNL  Sleep:   ok      Assessment and Plan: Bipolar disorder type I.  ADD, inattentive type.  Anxiety.  Patient is stable on her current medication.  Discussed upcoming MRI for her left ankle as she may require surgery because of persistent pain.  She is wearing boot.  She does  not want to change the medication.  However like to have a hydroxyzine for insomnia and anxiety.  Discussed current medication side effects and benefits.  Continue Prozac 40 mg daily, Abilify 15 mg daily, Lamictal 200 mg daily, Vyvanse 40 mg Monday to Friday and hydroxyzine as needed for anxiety.  I recommended if she had a surgery and prescribed pain medication then she should not take stimulants.  She agreed with the plan.  Recommended to call us back if there is any question or any concern.  [facility] blood work has not seen PCP in a while.  Follow-up in 3 months  Follow Up Instructions:    I discussed the assessment and treatment plan with the patient. The  patient was provided an opportunity to ask questions and all were answered. The patient agreed with the plan and demonstrated an understanding of the instructions.   The patient was advised to call back or seek an in-person evaluation if the symptoms worsen or if the condition fails to improve as anticipated.  I provided 20 minutes of non-face-to-face time during this encounter.   Kathlee Nations, MD

## 2021-05-09 ENCOUNTER — Encounter (HOSPITAL_COMMUNITY): Payer: Self-pay

## 2021-05-09 ENCOUNTER — Ambulatory Visit (HOSPITAL_COMMUNITY): Payer: Managed Care, Other (non HMO)

## 2021-05-19 ENCOUNTER — Other Ambulatory Visit: Payer: Self-pay

## 2021-05-19 ENCOUNTER — Ambulatory Visit (HOSPITAL_COMMUNITY)
Admission: RE | Admit: 2021-05-19 | Discharge: 2021-05-19 | Disposition: A | Discharge status: Home / Self Care | Payer: Commercial Managed Care - HMO | Source: Ambulatory Visit | Attending: Orthopedic Surgery | Admitting: Orthopedic Surgery

## 2021-05-19 DIAGNOSIS — M25572 Pain in left ankle and joints of left foot: Secondary | ICD-10-CM | POA: Insufficient documentation

## 2021-06-05 ENCOUNTER — Telehealth (HOSPITAL_COMMUNITY): Payer: Self-pay | Admitting: *Deleted

## 2021-06-05 NOTE — Telephone Encounter (Signed)
PA FOR VYVANSE 40 MG CAPSULES HAS BEEN SUBMITTED TO [employer] VIA [employer]. ? ?PA CASE ID# [id number].  ? ?AWAITING DETERMINATION. ?

## 2021-06-11 ENCOUNTER — Other Ambulatory Visit: Payer: Self-pay

## 2021-06-11 ENCOUNTER — Ambulatory Visit (INDEPENDENT_AMBULATORY_CARE_PROVIDER_SITE_OTHER): Payer: Managed Care, Other (non HMO)

## 2021-06-11 DIAGNOSIS — Z3042 Encounter for surveillance of injectable contraceptive: Secondary | ICD-10-CM | POA: Diagnosis not present

## 2021-06-11 MED ORDER — MEDROXYPROGESTERONE ACETATE 150 MG/ML IM SUSP
150.0000 mg | Freq: Once | INTRAMUSCULAR | Status: AC
  Administered 2021-06-11: 150 mg via INTRAMUSCULAR

## 2021-06-11 NOTE — Progress Notes (Signed)
Patient is here for Depo Provera Injection ?Patient is within Depo Provera Calender Limits yes last given 03/26/21  ?Next Depo Due between: may 30-June 13  ?Last AEX: 09/19/20 ?AEX Scheduled: 09/25/21 ? ?Patient is aware when next depo is due ? ?Pt tolerated Injection well.in RUOQ ? ?Routed to provider for review, encounter closed. ? ?

## 2021-06-12 ENCOUNTER — Encounter (HOSPITAL_COMMUNITY): Payer: Self-pay

## 2021-06-12 ENCOUNTER — Other Ambulatory Visit: Payer: Self-pay

## 2021-06-12 ENCOUNTER — Ambulatory Visit (HOSPITAL_COMMUNITY)
Admission: RE | Admit: 2021-06-12 | Discharge: 2021-06-12 | Disposition: A | Discharge status: Home / Self Care | Payer: Managed Care, Other (non HMO) | Source: Ambulatory Visit | Attending: Physician Assistant | Admitting: Physician Assistant

## 2021-06-12 VITALS — BP 130/84 | HR 103 | Temp 98.4°F | Resp 17

## 2021-06-12 DIAGNOSIS — Z20822 Contact with and (suspected) exposure to covid-19: Secondary | ICD-10-CM | POA: Insufficient documentation

## 2021-06-12 DIAGNOSIS — F17210 Nicotine dependence, cigarettes, uncomplicated: Secondary | ICD-10-CM | POA: Diagnosis not present

## 2021-06-12 DIAGNOSIS — Z8616 Personal history of COVID-19: Secondary | ICD-10-CM | POA: Insufficient documentation

## 2021-06-12 DIAGNOSIS — Z7951 Long term (current) use of inhaled steroids: Secondary | ICD-10-CM | POA: Insufficient documentation

## 2021-06-12 DIAGNOSIS — J069 Acute upper respiratory infection, unspecified: Secondary | ICD-10-CM | POA: Diagnosis not present

## 2021-06-12 DIAGNOSIS — R051 Acute cough: Secondary | ICD-10-CM | POA: Diagnosis present

## 2021-06-12 DIAGNOSIS — J45909 Unspecified asthma, uncomplicated: Secondary | ICD-10-CM | POA: Insufficient documentation

## 2021-06-12 DIAGNOSIS — Z793 Long term (current) use of hormonal contraceptives: Secondary | ICD-10-CM | POA: Insufficient documentation

## 2021-06-12 LAB — POC INFLUENZA A AND B ANTIGEN (URGENT CARE ONLY)
INFLUENZA A ANTIGEN, POC: NEGATIVE
INFLUENZA B ANTIGEN, POC: NEGATIVE

## 2021-06-12 LAB — SARS CORONAVIRUS 2 (TAT 6-24 HRS): SARS Coronavirus 2: NEGATIVE

## 2021-06-12 MED ORDER — PROMETHAZINE-DM 6.25-15 MG/5ML PO SYRP: 5.0000 mL | ORAL_SOLUTION | Freq: Three times a day (TID) | ORAL | 0 refills | Status: DC | PRN

## 2021-06-12 NOTE — Discharge Instructions (Signed)
Your flu testing was negative.  We will contact you if your COVID test is positive.  Please monitor your MyChart for this result.  Alternate Tylenol and ibuprofen for fever and pain.  Use Mucinex and Flonase for congestion.  I have called in Promethazine DM for cough.  This can make you sleepy so do not drive or drink alcohol while taking it.  Make sure you rest and drink plenty of fluids.  If your symptoms are not improving by next week please return here or see your PCP.  If you develop any severe symptoms including high fever, worsening cough, chest pain, nausea/vomiting interfering with oral intake you need to be seen immediately. ?

## 2021-06-12 NOTE — ED Provider Notes (Signed)
?[facility] ? ? ? ?CSN: [id number] ?Arrival date & time: 06/12/21  1829 ? ? ?  ? ?History   ?Chief Complaint ?Chief Complaint  ?Patient presents with  ? Appointment  ?  9:30  ? ? ?HPI ?CHARRIE Stephens is a 36 y.o. female.  ? ?Patient presents today with a 24-hour history of URI symptoms.  She reports cough, nasal congestion, body aches, chills, posttussive emesis.  She denies any fever, chest pain, shortness of breath, abdominal pain, diarrhea.  She has been taking Tylenol ibuprofen without improvement of symptoms.  She was exposed to COVID approximately 48 hours ago but denies additional sick contacts.  She has had COVID-19 vaccine including one booster.  Had COVID approximately 1 year ago.  She is a current everyday smoker but has been smoking less as a result of symptoms.  Reports a history of asthma and has been using albuterol inhaler more frequently since symptom onset.  She denies formal diagnosis of diabetes but does take Claritin during pollen season; to take this without improvement of symptoms.  She is confident she is not pregnant as she is on Depo-Provera. ? ? ?Past Medical History:  ?Diagnosis Date  ? Abnormal Pap smear of cervix   ? age 42 with hx of cryotherapy  ? ADHD (attention deficit hyperactivity disorder)   ? Anxiety   ? Asthma   ? daily and prn inhalers  ? Bipolar affective disorder ([place])   ? COVID 05/2020  ? Dental crowns present   ? Depression   ? Deviated nasal septum 05/2012  ? Eczema   ? right leg  ? Esophageal reflux   ? Fibromyalgia   ? Heart murmur   ? Hypertension   ? IBS (irritable bowel syndrome)   ? Migraine headache   ? with aura  ? Nasal turbinate hypertrophy 05/2012  ? bilateral  ? OCD (obsessive compulsive disorder)   ? STD (sexually transmitted disease)   ? Hx of abnormal pap--age 56   ? 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 TMJ syndrome   ? ? ?Patient Active Problem List  ? Diagnosis Date Noted  ? Intentional overdose of drug in tablet form ([place])  01/26/2017  ? Bipolar 1 disorder, depressed, severe ([place]) 01/26/2017  ? Bipolar 2 disorder ([place]) 05/16/2014  ? Internal hemorrhoids with complication 93/71/6967  ? Vasomotor rhinitis 04/15/2013  ? Gastritis without bleeding 03/10/2013  ? Anal fissure 03/10/2013  ? Obsessive compulsive disorder 03/07/2013  ? Fibromyalgia 02/06/2013  ? Detachment of glenoid labrum 01/13/2013  ? Difficulty in walking(719.7) 02/18/2012  ? Seasonal affective disorder ([place]) 11/25/2011  ? Asthma, intrinsic 08/28/2010  ? ATTENTION DEFICIT DISORDER, ADULT 01/19/2008  ? RHINITIS MEDICAMENTOSA 12/08/2007  ? ALLERGIC RHINITIS 12/08/2007  ? IBS 12/08/2007  ? ECZEMA 12/08/2007  ? 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 Migraine headache 04/24/2007  ? Essential hypertension 04/24/2007  ? ASVD 04/24/2007  ? GERD 04/24/2007  ? VSD 04/24/2007  ? 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 ? ?Past Surgical History:  ?Procedure Laterality Date  ? ASD AND VSD REPAIR  1989  ? CARDIAC CATHETERIZATION  06/18/2006  ? CESAREAN SECTION  04/12/2009  ? [place]  ? COLONOSCOPY N/A 05/20/2013  ? Procedure: COLONOSCOPY;  Surgeon: Danie Binder, MD;  [facility];  Service: Endoscopy;  Laterality: N/A;  115-moved to [place] notified pt  ? ESOPHAGOGASTRODUODENOSCOPY  04/04/2008    ? Normal esophagus/Mild patchy erythema  in the antrum/ Normal duodenal bulb, normal small bowel biopsy  ? FLEXIBLE SIGMOIDOSCOPY  04/04/2008    ? Small internal hemorrhoids (poor bowel prep)  ? HEMORRHOID BANDING N/A 05/20/2013  ? Procedure: HEMORRHOID BANDING;  Surgeon: Danie Binder, MD;  [facility];  Service: Endoscopy;  Laterality: N/A;  ? NASAL SEPTOPLASTY W/ TURBINOPLASTY Bilateral 05/31/2012  ? Procedure: NASAL SEPTOPLASTY WITH TURBINATE REDUCTION;  Surgeon: Ascencion Dike, MD;  Location: [facility];  Service: ENT;  Laterality: Bilateral;  ? ? ?OB History   ? ? Gravida  ?1  ? Para  ?1  ? Term  ?1  ? Preterm  ?   ? AB  ?   ?  Living  ?1  ?  ? ? SAB  ?   ? IAB  ?   ? Ectopic  ?   ? Multiple  ?   ? Live Births  ?   ?   ?  ?  ? ? ? ?Home Medications   ? ?Prior to Admission medications   ?Medication Sig Start Date End Date Taking? Authorizing Provider  ?promethazine-dextromethorphan (PROMETHAZINE-DM) 6.25-15 MG/5ML syrup Take 5 mLs by mouth 3 (three) times daily as needed for cough. 06/12/21  Yes Karalyn Kadel, Junie Panning K, PA-C  ?acetaminophen (TYLENOL) 500 MG tablet Take 1,000-2,000 mg by mouth every 6 (six) hours as needed for mild pain, moderate pain, fever or headache.    [provider]  ?albuterol (VENTOLIN HFA) 108 (90 Base) MCG/ACT inhaler Inhale 2 puffs into the lungs every 6 (six) hours as needed for wheezing or shortness of breath (Cough). 03/27/21   Lynden Oxford Scales, PA-C  ?ARIPiprazole (ABILIFY) 15 MG tablet Take 1 tablet (15 mg total) by mouth daily. 05/08/21   Arfeen, Arlyce Harman, MD  ?aspirin 81 MG chewable tablet Chew by mouth daily.    [provider]  ?FLUoxetine (PROZAC) 40 MG capsule Take 1 capsule (40 mg total) by mouth daily. 05/08/21   Arfeen, Arlyce Harman, MD  ?fluticasone (FLONASE) 50 MCG/ACT nasal spray USE 2 SPRAYS IN EACH NOSTRIL EVERY DAY ?Patient taking differently: Place 2 sprays into both nostrils daily. 09/21/17   Mikey Kirschner, MD  ?HYDROCORTISONE EX Apply 1 application topically as needed (ECZEMA).    [provider]  ?hydrOXYzine (ATARAX) 25 MG tablet Take 1 tablet (25 mg total) by mouth daily as needed for anxiety. 05/08/21   Arfeen, Arlyce Harman, MD  ?ibuprofen (ADVIL) 200 MG tablet Take 200 mg by mouth every 6 (six) hours as needed for mild pain or headache.    [provider]  ?lamoTRIgine (LAMICTAL) 200 MG tablet Take 1 tablet (200 mg total) by mouth at bedtime. 05/08/21   Arfeen, Arlyce Harman, MD  ?lisdexamfetamine (VYVANSE) 40 MG capsule Take 1 capsule (40 mg total) by mouth daily. 05/08/21   Arfeen, Arlyce Harman, MD  ?loratadine (CLARITIN) 10 MG tablet Take 1 tablet by mouth daily as needed for  allergies.    [provider]  ?medroxyPROGESTERone (DEPO-PROVERA) 150 MG/ML injection Inject 150 mg into the muscle every 3 (three) months.    [provider]  ?metoprolol succinate (TOPROL-XL) 25 MG 24 hr tablet Take 1.5 tablets (37.5 mg total) by mouth daily. 11/19/20   [name], MD  ? ? ?Family History ?Family History  ?Problem Relation Age of Onset  ? Hyperlipidemia Mother   ? Hypertension Mother   ? Depression Mother   ? Physical abuse Mother   ? Sexual abuse  Mother   ?     possibly  ? Sleep apnea Mother   ? Stroke Mother   ? Emphysema Paternal Grandmother   ? Heart disease Paternal Grandmother   ? Bipolar disorder Paternal Grandmother   ? Dementia Paternal Grandmother   ? Heart disease Maternal Grandmother   ? Dementia Maternal Grandmother   ? Stroke Maternal Grandfather   ? Prostate cancer Maternal Grandfather   ? Thyroid disease Maternal Grandfather   ? Personality disorder Father   ? Bipolar disorder Father   ? Alcohol abuse Father   ? ADD / ADHD Sister   ? Anxiety disorder Brother   ? ADD / ADHD Brother   ? Seizures Daughter   ? Drug abuse Neg Hx   ? OCD Neg Hx   ? Paranoid behavior Neg Hx   ? Schizophrenia Neg Hx   ? ? ?Social History ?Social History  ? ?Tobacco Use  ? Smoking status: Every Day  ?  Packs/day: 0.50  ?  Years: 10.00  ?  Pack years: 5.00  ?  Types: Cigarettes  ?  Start date: 02/27/2002  ? Smokeless tobacco: Never  ? Tobacco comments:  ?  has cut back to less than 1/2 a pack and trying to quit  ?Vaping Use  ? Vaping Use: Never used  ?Substance Use Topics  ? Alcohol use: No  ?  Alcohol/week: 0.0 standard drinks  ?  Comment: recovering alcoholic 8 mos sober as of 04/15/13  ? Drug use: No  ? ? ? ?Allergies   ?Sulfonamide derivatives, Coconut flavor, and Coconut oil ? ? ?Review of Systems ?Review of Systems  ?Constitutional:  Positive for activity change, chills and fatigue. Negative for appetite change and fever.  ?HENT:  Positive for congestion and sore throat. Negative  for sinus pressure and sneezing.   ?Respiratory:  Positive for cough and chest tightness. Negative for shortness of breath.   ?Cardiovascular:  Negative for chest pain.  ?Gastrointestinal:  Positive for vomiting. Neg[place]

## 2021-06-12 NOTE — ED Triage Notes (Signed)
Pt presents with c/o body aches, chills, vomiting, productive cough.  ? ?States she has been exposed to someone with positive COVID.  ?

## 2021-06-19 ENCOUNTER — Telehealth (HOSPITAL_COMMUNITY): Payer: Self-pay | Admitting: *Deleted

## 2021-06-19 NOTE — Telephone Encounter (Signed)
PA FOR VYVANSE 40 MG CAPSULE HAS BEEN APPROVED BY [employer].  ? ?THE AUTHORIZATION IS EFFECTIVE FROM 06/04/21 THROUGH 06/18/22.   ? ?REQUEST ID [id number] ?

## 2021-06-26 ENCOUNTER — Telehealth (HOSPITAL_COMMUNITY): Payer: Self-pay | Admitting: *Deleted

## 2021-06-26 DIAGNOSIS — F900 Attention-deficit hyperactivity disorder, predominantly inattentive type: Secondary | ICD-10-CM

## 2021-06-26 MED ORDER — LISDEXAMFETAMINE DIMESYLATE 40 MG PO CAPS: 40.0000 mg | ORAL_CAPSULE | Freq: Every day | ORAL | 0 refills | Status: DC

## 2021-06-26 NOTE — Telephone Encounter (Signed)
Done. She can pick up medication on her due date. ?

## 2021-06-26 NOTE — Telephone Encounter (Signed)
Pt called requesting a refill of Vyvanse 40 mg. Last script written on 05/08/21. Pt has an upcoming appointment on 08/07/21. ?

## 2021-07-12 ENCOUNTER — Telehealth: Payer: Self-pay | Admitting: *Deleted

## 2021-07-12 NOTE — Telephone Encounter (Signed)
? ?  Pre-operative Risk Assessment  ?  ?Patient Name: Kristin Stephens  ?DOB: Dec 08, 1985 ?MRN: [id number]  ? ?  ? ?Request for Surgical Clearance   ? ?Procedure:   OPEN DEBRIDEMENT OF LEFT PERONEAL TENDONS ? ?Date of Surgery:  Clearance 08/14/21                              ?   ?Surgeon:  DR. Armond Hang ?Surgeon's Group or Practice Name:  [facility] ?Phone number:  [phone] ?Fax number:  [phone] ATTN: ASHLEY HILTON ?  ?Type of Clearance Requested:   ?- Medical  ?- Pharmacy:  Hold Aspirin   ?  ?Type of Anesthesia:  CHOICE ?  ?Additional requests/questions:   ? ?Signed, ?Julaine Hua   ?07/12/2021, 4:44 PM  ? ?

## 2021-07-15 NOTE — Telephone Encounter (Signed)
? ?  Patient Name: Kristin Stephens  ?DOB: September 30, 1985 ?MRN: [id number] ? ?Primary Cardiologist: Dorris Carnes, MD ? ?Chart reviewed as part of pre-operative protocol coverage.  ? ?Per pre-op protocol, clearance must be routed to primary cardiologist if patient has history of congenital heart disease. Patient has history of ASD, VSD, coarctation s/p repair, last visit 10/2020 with plans to f/u in 12 months. Bp was mildly elevated at that visit. ? ?Dr. Harrington Challenger, we are asked to provide surgical clearance of open debridement of left peroneal tendons and to hold aspirin to do so. Once we have your reply we will plan to set up a virtual visit to ensure no new symptoms. Please route response to P CV DIV PREOP (the pre-op pool). Thank you. ? ? ?Charlie Pitter, PA-C ?07/15/2021, 11:14 AM ? ? ?

## 2021-07-25 ENCOUNTER — Telehealth (HOSPITAL_COMMUNITY): Payer: Self-pay

## 2021-07-25 DIAGNOSIS — F900 Attention-deficit hyperactivity disorder, predominantly inattentive type: Secondary | ICD-10-CM

## 2021-07-25 MED ORDER — LISDEXAMFETAMINE DIMESYLATE 40 MG PO CAPS: 40.0000 mg | ORAL_CAPSULE | Freq: Every day | ORAL | 0 refills | Status: DC

## 2021-07-25 NOTE — Telephone Encounter (Signed)
Send to her pharmacy. She can pick up on her due date. ?

## 2021-07-25 NOTE — Telephone Encounter (Signed)
Patient called regarding her Vyvanse '40mg'$ . Pt stated that she's about 1 week out from being completely out of medication and would need it by the first week in May. Pt uses [facility] on [address] in [place]. She has a followup appointment scheduled on 5/10 and pt wanted me to let you know that she would like to talk with you about her mom at the time of her appointment (pt's mom has been diagnosed with Leukemia). Please review and advise. Thank you ?

## 2021-07-26 NOTE — Telephone Encounter (Signed)
NOTIFIED PATIENT °

## 2021-07-29 NOTE — Telephone Encounter (Signed)
Covering preop today. Note routed to Dr. Harrington Challenger 07/15/21, awaiting reply. Will re-send for completeness. Dr. Harrington Challenger - Please route response to P CV DIV PREOP (the pre-op pool). Thank you. ?

## 2021-08-02 ENCOUNTER — Encounter: Payer: Self-pay | Admitting: Internal Medicine

## 2021-08-05 ENCOUNTER — Telehealth: Payer: Self-pay | Admitting: *Deleted

## 2021-08-05 NOTE — Telephone Encounter (Signed)
? ?  Pre-operative Risk Assessment  ?  ?Patient Name: Kristin Stephens  ?DOB: 06-25-1985 ?MRN: [id number]  ? ?  ? ?Request for Surgical Clearance   ? ?Procedure:   Open debridement of left peroneal tendons. ? ?Date of Surgery:  Clearance 08/14/21                              ?   ?Surgeon:  Dr. Armond Hang. ?Surgeon's Group or Practice Name:  [facility] ?Phone number:  [phone] ?Fax number:  [phone] ?  ?Type of Clearance Requested:   ?- Medical  ?- Pharmacy:  Hold Aspirin not indicated. ?  ?Type of Anesthesia:  Not Indicated ?  ?Additional requests/questions:   ? ?Signed, ?Asher Torpey Avanell Shackleton   ?08/05/2021, 12:34 PM   ?

## 2021-08-05 NOTE — Telephone Encounter (Signed)
This is duplicate request. See prior conversation. Waiting Dr. Recommendations. Will send again.  ?

## 2021-08-06 ENCOUNTER — Encounter (HOSPITAL_BASED_OUTPATIENT_CLINIC_OR_DEPARTMENT_OTHER): Payer: Self-pay | Admitting: Orthopaedic Surgery

## 2021-08-06 ENCOUNTER — Other Ambulatory Visit: Payer: Self-pay

## 2021-08-06 NOTE — Telephone Encounter (Signed)
OK to hold aspirin for open debridement  Resume after  ?

## 2021-08-07 ENCOUNTER — Telehealth (HOSPITAL_BASED_OUTPATIENT_CLINIC_OR_DEPARTMENT_OTHER): Payer: 59 | Admitting: Psychiatry

## 2021-08-07 ENCOUNTER — Encounter (HOSPITAL_COMMUNITY): Payer: Self-pay | Admitting: Psychiatry

## 2021-08-07 VITALS — Wt 219.0 lb

## 2021-08-07 DIAGNOSIS — F900 Attention-deficit hyperactivity disorder, predominantly inattentive type: Secondary | ICD-10-CM

## 2021-08-07 DIAGNOSIS — F419 Anxiety disorder, unspecified: Secondary | ICD-10-CM | POA: Diagnosis not present

## 2021-08-07 DIAGNOSIS — F3131 Bipolar disorder, current episode depressed, mild: Secondary | ICD-10-CM | POA: Diagnosis not present

## 2021-08-07 MED ORDER — ARIPIPRAZOLE 15 MG PO TABS: 15.0000 mg | ORAL_TABLET | Freq: Every day | ORAL | 2 refills | Status: DC

## 2021-08-07 MED ORDER — FLUOXETINE HCL 40 MG PO CAPS: 40.0000 mg | ORAL_CAPSULE | Freq: Every day | ORAL | 2 refills | Status: DC

## 2021-08-07 MED ORDER — LAMOTRIGINE 200 MG PO TABS: 200.0000 mg | ORAL_TABLET | Freq: Every day | ORAL | 2 refills | Status: DC

## 2021-08-07 NOTE — Progress Notes (Signed)
Virtual Visit via Telephone Note ? ?I connected with Kristin Stephens on 08/07/21 at  2:20 PM EDT by telephone and verified that I am speaking with the correct person using two identifiers. ? ?Location: ?Patient: At work ?Provider: Home Office ?  ?I discussed the limitations, risks, security and privacy concerns of performing an evaluation and management service by telephone and the availability of in person appointments. I also discussed with the patient that there may be a patient responsible charge related to this service. The patient expressed understanding and agreed to proceed. ? ? ?History of Present Illness: ?Patient is evaluated by phone session.  She reported that the mother diagnosed with chronic leukemia in April and that was a very difficult time for the family.  She was having crying spells and she took hydroxyzine almost every day until recently find out that prognosis is much better and she started taking medication and doing better.  She is talking to her mother more frequently.  She also working full-time at [employer] and her job is going well.  Tomorrow she is scheduled to have procedure for her ankle debridement.  She is wearing a walking cast but hoping after the procedure able to get better and in 6 weeks pain-free.  People from job are very supportive and she is allowed to work from home for a few weeks after the procedure.  Her relationship with the boyfriend is going well.  She does do face time with her daughter Minna Merritts who is now 3 years old.  Patient denies any suicidal thoughts, mania, excessive spending or any hallucination.  Her attention and concentration is good.  She takes Vyvanse Monday to Friday and reported no major concern.  She has no tremor or shakes or any EPS.  She has been sober from drinking for more than 3-1/2 years.  She like to keep the current medication. ? ?  ?Past Psychiatric History:  ?H/O mania, depression, anger, overdose and slashing her wrist.  Did IOP in 2016.  Inpatient in October 2018. H/O ETOH. Took Adderall, Ativan, Gabapentin, Wellbutrin but limited response.    ? ?Psychiatric Specialty Exam: ?Physical Exam  ?Review of Systems  ?Weight 219 lb (99.3 kg).There is no height or weight on file to calculate BMI.  ?General Appearance: NA  ?Eye Contact:  NA  ?Speech:  Clear and Coherent and Normal Rate  ?Volume:  Normal  ?Mood:  Euthymic  ?Affect:  NA  ?Thought Process:  Goal Directed  ?Orientation:  Full (Time, Place, and Person)  ?Thought Content:  Rumination  ?Suicidal Thoughts:  No  ?Homicidal Thoughts:  No  ?Memory:  Immediate;   Good ?Recent;   Good ?Remote;   Good  ?Judgement:  Intact  ?Insight:  Present  ?Psychomotor Activity:  NA  ?Concentration:  Concentration: Fair and Attention Span: Fair  ?Recall:  Good  ?Fund of Knowledge:  Good  ?Language:  Good  ?Akathisia:  No  ?Handed:  Right  ?AIMS (if indicated):     ?Assets:  Communication Skills ?Desire for Improvement ?Housing ?Resilience ?Social Support ?Talents/Skills ?Transportation  ?ADL's:  Intact  ?Cognition:  WNL  ?Sleep:   ok  ? ? ? ? ?Assessment and Plan: ?Bipolar disorder type I.  ADD, inattentive type.  Anxiety. ? ?Discussed news about her mother who diagnosed with chronic leukemia.  She took hydroxyzine regularly but now cut down as things are better.  Her mother is doing well and prognosis is better.  She also anxious about her upcoming procedure  tomorrow.  She will require ankle debridement and hoping after 6 weeks pain-free.  Patient does not want to change the medication.  She does not need a new prescription of hydroxyzine.  Continue Prozac 40 mg daily, Abilify 15 mg daily, Lamictal 200 mg daily and Vyvanse 40 mg Monday to Friday.  Recommended to call us back if she is any question or any concern.  Follow-up in 3 months. ? ?Follow Up Instructions: ? ?  ?I discussed the assessment and treatment plan with the patient. The patient was provided an opportunity to ask questions and all were answered. The  patient agreed with the plan and demonstrated an understanding of the instructions. ?  ?The patient was advised to call back or seek an in-person evaluation if the symptoms worsen or if the condition fails to improve as anticipated. ? ?Collaboration of Care: Primary Care Provider AEB notes are available in epic to review. ? ?Patient/Guardian was advised Release of Information must be obtained prior to any record release in order to collaborate their care with an outside provider. Patient/Guardian was advised if they have not already done so to contact the registration department to sign all necessary forms in order for [place] to release information regarding their care.  ? ?Consent: Patient/Guardian gives verbal consent for treatment and assignment of benefits for services provided during this visit. Patient/Guardian expressed understanding and agreed to proceed.   ? ?I provided 21 minutes of non-face-to-face time during this encounter. ? ? ?Kathlee Nations, MD  ?

## 2021-08-07 NOTE — Telephone Encounter (Signed)
Patient will need a virtual telephone visit on 5/11 as she will need to hold aspirin for 5 days prior to the procedure and that the surgery is already scheduled for 5/17 ?

## 2021-08-07 NOTE — Telephone Encounter (Signed)
Dr. Harrington Challenger replied to the separate clearance request.  We will remove this duplicate from the pool.  I have forwarded the request to callback pool to arrange a virtual telephone visit. ?

## 2021-08-08 ENCOUNTER — Telehealth: Payer: Self-pay | Admitting: *Deleted

## 2021-08-08 NOTE — Telephone Encounter (Signed)
S/w the pt and she is agreeable to plan of care for tele pre op appt. Med rec and consent are done. ?

## 2021-08-08 NOTE — Telephone Encounter (Signed)
I s/w the pt and was going to set up a tele pre op call today and go over ASA recommendations. Pt tells me that Dr. Harrington Challenger is not who prescribed the ASA, this was prescribed by ortho from when she injured her leg. She tells me that she has been off ASA since 05/2021. Pt tells me that the orthopedic will have restart ASA post surgery as DVT preventive.  ? ?I stated to the pt let me update Dr. Harrington Challenger and the pre op provider for further advice, as well as if tele pre op call still needed today. Advised pt that I will call her back with update. Pt thanked me for the call and the help.  ? ?

## 2021-08-08 NOTE — Telephone Encounter (Signed)
S/w the pt and she is agreeable to plan of care for tele pre op appt. Med rec and consent are done. ? ?  ?Patient Consent for Virtual Visit  ? ? ?   ? ?Kristin Stephens has provided verbal consent on 08/08/2021 for a virtual visit (video or telephone). ? ? ?CONSENT FOR VIRTUAL VISIT FOR:  Kristin Stephens  ?By participating in this virtual visit I agree to the following: ? ?I hereby voluntarily request, consent and authorize [facility] and its employed or contracted physicians, physician assistants, nurse practitioners or other licensed health care professionals (the Practitioner), to provide me with telemedicine health care services (the ?Services") as deemed necessary by the treating Practitioner. I acknowledge and consent to receive the Services by the Practitioner via telemedicine. I understand that the telemedicine visit will involve communicating with the Practitioner through live audiovisual communication technology and the disclosure of certain medical information by electronic transmission. I acknowledge that I have been given the opportunity to request an in-person assessment or other available alternative prior to the telemedicine visit and am voluntarily participating in the telemedicine visit. ? ?I understand that I have the right to withhold or withdraw my consent to the use of telemedicine in the course of my care at any time, without affecting my right to future care or treatment, and that the Practitioner or I may terminate the telemedicine visit at any time. I understand that I have the right to inspect all information obtained and/or recorded in the course of the telemedicine visit and may receive copies of available information for a reasonable fee.  I understand that some of the potential risks of receiving the Services via telemedicine include:  ?Delay or interruption in medical evaluation due to technological equipment failure or disruption; ?Information transmitted may not be sufficient  (e.g. poor resolution of images) to allow for appropriate medical decision making by the Practitioner; and/or  ?In rare instances, security protocols could fail, causing a breach of personal health information. ? ?Furthermore, I acknowledge that it is my responsibility to provide information about my medical history, conditions and care that is complete and accurate to the best of my ability. I acknowledge that Practitioner's advice, recommendations, and/or decision may be based on factors not within their control, such as incomplete or inaccurate data provided by me or distortions of diagnostic images or specimens that may result from electronic transmissions. I understand that the practice of medicine is not an exact science and that Practitioner makes no warranties or guarantees regarding treatment outcomes. I acknowledge that a copy of this consent can be made available to me via my patient portal ([facility]), or I can request a printed copy by calling the office of [facility].   ? ?I understand that my insurance will be billed for this visit.  ? ?I have read or had this consent read to me. ?I understand the contents of this consent, which adequately explains the benefits and risks of the Services being provided via telemedicine.  ?I have been provided ample opportunity to ask questions regarding this consent and the Services and have had my questions answered to my satisfaction. ?I give my informed consent for the services to be provided through the use of telemedicine in my medical care ? ? ? ?

## 2021-08-09 ENCOUNTER — Ambulatory Visit (INDEPENDENT_AMBULATORY_CARE_PROVIDER_SITE_OTHER): Payer: Commercial Managed Care - HMO | Admitting: General Practice

## 2021-08-09 DIAGNOSIS — Z01810 Encounter for preprocedural cardiovascular examination: Secondary | ICD-10-CM

## 2021-08-09 NOTE — Progress Notes (Signed)
? ?Virtual Visit via Telephone Note  ? ?This visit type was conducted due to national recommendations for restrictions regarding the COVID-19 Pandemic (e.g. social distancing) in an effort to limit this patient's exposure and mitigate transmission in our community.  Due to her co-morbid illnesses, this patient is at least at moderate risk for complications without adequate follow up.  This format is felt to be most appropriate for this patient at this time.  The patient did not have access to video technology/had technical difficulties with video requiring transitioning to audio format only (telephone).  All issues noted in this document were discussed and addressed.  No physical exam could be performed with this format.  Please refer to the patient's chart for her  consent to telehealth for [facility]. ? ?Evaluation Performed:  Preoperative cardiovascular risk assessment ?_____________  ? ?Date:  08/09/2021  ? ?Patient ID:  Kristin Stephens, Kristin Stephens December 05, 1985, MRN [id number] ?Patient Location:  ?Home ?Provider location:   ?Office ? ?Primary Care Provider:  Patient, No Pcp Per (Inactive) ?Primary Cardiologist:  Dorris Carnes, MD ? ?Chief Complaint  ?  ?36 y.o. y/o female with a h/o ASD, VSD, coarctation status postrepair, palpitations, HTN, who is pending open debridement of left peroneal tendons, and presents today for telephonic preoperative cardiovascular risk assessment. ? ?Past Medical History  ?  ?Past Medical History:  ?Diagnosis Date  ? Abnormal Pap smear of cervix   ? age 48 with hx of cryotherapy  ? ADHD (attention deficit hyperactivity disorder)   ? Anxiety   ? Asthma   ? daily and prn inhalers  ? Bipolar affective disorder ([place])   ? COVID 05/2020  ? Dental crowns present   ? Depression   ? Deviated nasal septum 05/2012  ? Eczema   ? right leg  ? Esophageal reflux   ? Fibromyalgia   ? Heart murmur   ? Hypertension   ? IBS (irritable bowel syndrome)   ? Migraine headache   ? with aura  ? Nasal turbinate  hypertrophy 05/2012  ? bilateral  ? OCD (obsessive compulsive disorder)   ? STD (sexually transmitted disease)   ? Hx of abnormal pap--age 3   ? Substance abuse ([place])   ? recovering alcoholic  ? TMJ syndrome   ? ?Past Surgical History:  ?Procedure Laterality Date  ? ASD AND VSD REPAIR  1989  ? CARDIAC CATHETERIZATION  06/18/2006  ? CESAREAN SECTION  04/12/2009  ? [place]  ? COLONOSCOPY N/A 05/20/2013  ? Procedure: COLONOSCOPY;  Surgeon: Danie Binder, MD;  [facility];  Service: Endoscopy;  Laterality: N/A;  115-moved to [place] notified pt  ? ESOPHAGOGASTRODUODENOSCOPY  04/04/2008    ? Normal esophagus/Mild patchy erythema in the antrum/ Normal duodenal bulb, normal small bowel biopsy  ? FLEXIBLE SIGMOIDOSCOPY  04/04/2008    ? Small internal hemorrhoids (poor bowel prep)  ? HEMORRHOID BANDING N/A 05/20/2013  ? Procedure: HEMORRHOID BANDING;  Surgeon: Danie Binder, MD;  [facility];  Service: Endoscopy;  Laterality: N/A;  ? NASAL SEPTOPLASTY W/ TURBINOPLASTY Bilateral 05/31/2012  ? Procedure: NASAL SEPTOPLASTY WITH TURBINATE REDUCTION;  Surgeon: Ascencion Dike, MD;  Location: [place];  Service: ENT;  Laterality: Bilateral;  ? ? ?Allergies ? ?Allergies  ?Allergen Reactions  ? Sulfonamide Derivatives Swelling  ?  SWELLING OF EYES WITH OPHTHALMIC SULFA  ? Coconut Flavor Rash  ? Coconut Oil Rash  ? ? ?History of Present  Illness  ?  ?Kristin Stephens is a 36 y.o. female who presents via audio/video conferencing for a telehealth visit today.  Pt was last seen in cardiology clinic on 11/19/2020 by Dr. Harrington Challenger.  At that time Kristin Stephens was doing well .  The patient is now pending procedure as outlined above. Since her last visit, she she continues to do well from a cardiac standpoint. ? ?Today she denies chest pain, shortness of breath, lower extremity edema, fatigue, palpitations, melena, hematuria, hemoptysis, diaphoresis, weakness, presyncope,  syncope, orthopnea, and PND. ? ? ?Home Medications  ?  ?Prior to Admission medications   ?Medication Sig Start Date End Date Taking? Authorizing Provider  ?acetaminophen (TYLENOL) 500 MG tablet Take 1,000-2,000 mg by mouth every 6 (six) hours as needed for mild pain, moderate pain, fever or headache.    [provider]  ?albuterol (VENTOLIN HFA) 108 (90 Base) MCG/ACT inhaler Inhale 2 puffs into the lungs every 6 (six) hours as needed for wheezing or shortness of breath (Cough). 03/27/21   Lynden Oxford Scales, PA-C  ?ARIPiprazole (ABILIFY) 15 MG tablet Take 1 tablet (15 mg total) by mouth daily. 08/07/21   Arfeen, Arlyce Harman, MD  ?aspirin 81 MG chewable tablet Chew by mouth daily. ?Patient not taking: Reported on 08/08/2021    [provider]  ?FLUoxetine (PROZAC) 40 MG capsule Take 1 capsule (40 mg total) by mouth daily. 08/07/21   Arfeen, Arlyce Harman, MD  ?fluticasone (FLONASE) 50 MCG/ACT nasal spray USE 2 SPRAYS IN EACH NOSTRIL EVERY DAY ?Patient taking differently: Place 2 sprays into both nostrils daily. 09/21/17   Mikey Kirschner, MD  ?hydrOXYzine (ATARAX) 25 MG tablet Take 1 tablet (25 mg total) by mouth daily as needed for anxiety. 05/08/21   Arfeen, Arlyce Harman, MD  ?ibuprofen (ADVIL) 200 MG tablet Take 200 mg by mouth every 6 (six) hours as needed for mild pain or headache.    [provider]  ?lamoTRIgine (LAMICTAL) 200 MG tablet Take 1 tablet (200 mg total) by mouth at bedtime. 08/07/21   Arfeen, Arlyce Harman, MD  ?lisdexamfetamine (VYVANSE) 40 MG capsule Take 1 capsule (40 mg total) by mouth daily. 07/25/21   Arfeen, Arlyce Harman, MD  ?loratadine (CLARITIN) 10 MG tablet Take 1 tablet by mouth daily as needed for allergies.    [provider]  ?medroxyPROGESTERone (DEPO-PROVERA) 150 MG/ML injection Inject 150 mg into the muscle every 3 (three) months.    [provider]  ?metoprolol succinate (TOPROL-XL) 25 MG 24 hr tablet Take 1.5 tablets (37.5 mg total) by mouth daily. 11/19/20   [name], MD  ? ? ?Physical Exam  ?  ?Vital Signs:  Kristin Stephens does not have vital signs available for review today. ? ?Given telephonic nature of communication, physical exam is limited. ?AAOx3. NAD. Normal affect.  Speech and respirations are unlabored. ? ?Accessory Clinical Findings  ?  ?None ? ?Assessment & Plan  ?  ?1.  Preoperative Cardiovascular Risk Assessment: Open debridement of the left peroneal tendons, [facility], Dr. Kathaleen Bury, [phone] ? ?  ? ?Primary Cardiologist: Dorris Carnes, MD ? ?Chart reviewed as part of pre-operative protocol coverage. Given past medical history and time since last visit, based on ACC/AHA guidelines, Kristin Stephens would be at acceptable risk for the planned procedure without further cardiovascular testing.  ? ?Patient was advised that if she develops new symptoms prior to surgery to contact our office to arrange a follow-up appointment.  He verbalized understanding. ? ? ?  Her RCRI is a class I risk, 0.4% risk of major cardiac event.  She is able to complete greater than 4 METS of physical activity. ? ? ? ?A copy of this note will be routed to requesting surgeon. ? ?Time:   ?Today, I have spent 5 minutes with the patient with telehealth technology discussing medical history, symptoms, and management plan.  Prior to her phone evaluation I spent greater than 10 minutes reviewing her past medical history and medications. ? ? ?Deberah Pelton, NP ? ?08/09/2021, 8:29 AM ? ?

## 2021-08-09 NOTE — Telephone Encounter (Signed)
Reviewed    ?OK to use aspirin per orhto ?

## 2021-08-13 NOTE — H&P (Signed)
ORTHOPAEDIC SURGERY H&P ? ?Subjective: ? ?The patient presents with left peroneal tendon tenosynovitis of both brevis and longus with left peroneus brevis longitudinal tear not responding to nonoperative management for several months. ? ? ?Past Medical History:  ?Diagnosis Date  ? Abnormal Pap smear of cervix   ? age 36 with hx of cryotherapy  ? ADHD (attention deficit hyperactivity disorder)   ? Anxiety   ? Asthma   ? daily and prn inhalers  ? Bipolar affective disorder ([place])   ? COVID 05/2020  ? Dental crowns present   ? Depression   ? Deviated nasal septum 05/2012  ? Eczema   ? right leg  ? Esophageal reflux   ? Fibromyalgia   ? Heart murmur   ? Hypertension   ? IBS (irritable bowel syndrome)   ? Migraine headache   ? with aura  ? Nasal turbinate hypertrophy 05/2012  ? bilateral  ? OCD (obsessive compulsive disorder)   ? STD (sexually transmitted disease)   ? Hx of abnormal pap--age 23   ? Substance abuse ([place])   ? recovering alcoholic  ? TMJ syndrome   ? ? ?Past Surgical History:  ?Procedure Laterality Date  ? ASD AND VSD REPAIR  1989  ? CARDIAC CATHETERIZATION  06/18/2006  ? CESAREAN SECTION  04/12/2009  ? [place]  ? COLONOSCOPY N/A 05/20/2013  ? Procedure: COLONOSCOPY;  Surgeon: Danie Binder, MD;  [facility];  Service: Endoscopy;  Laterality: N/A;  115-moved to [place] notified pt  ? ESOPHAGOGASTRODUODENOSCOPY  04/04/2008    ? Normal esophagus/Mild patchy erythema in the antrum/ Normal duodenal bulb, normal small bowel biopsy  ? FLEXIBLE SIGMOIDOSCOPY  04/04/2008    ? Small internal hemorrhoids (poor bowel prep)  ? HEMORRHOID BANDING N/A 05/20/2013  ? Procedure: HEMORRHOID BANDING;  Surgeon: Danie Binder, MD;  [facility];  Service: Endoscopy;  Laterality: N/A;  ? NASAL SEPTOPLASTY W/ TURBINOPLASTY Bilateral 05/31/2012  ? Procedure: NASAL SEPTOPLASTY WITH TURBINATE REDUCTION;  Surgeon: Ascencion Dike, MD;  Location: [place];  Service: ENT;   Laterality: Bilateral;  ?  ? ?(Not in an outpatient encounter) ?  ? ?Allergies  ?Allergen Reactions  ? Sulfonamide Derivatives Swelling  ?  SWELLING OF EYES WITH OPHTHALMIC SULFA  ? Coconut Flavor Rash  ? Coconut Oil Rash  ? ? ?Social History  ? ?Socioeconomic History  ? Marital status: Soil scientist  ?  Spouse name: Not on file  ? Number of children: 1  ? Years of education: GED  ? Highest education level: Not on file  ?Occupational History  ? Occupation: [employer]  ?Tobacco Use  ? Smoking status: Every Day  ?  Packs/day: 0.50  ?  Years: 10.00  ?  Pack years: 5.00  ?  Types: Cigarettes  ?  Start date: 02/27/2002  ? Smokeless tobacco: Never  ? Tobacco comments:  ?  has cut back to less than 1/2 a pack and trying to quit  ?Vaping Use  ? Vaping Use: Never used  ?Substance and Sexual Activity  ? Alcohol use: No  ?  Alcohol/week: 0.0 standard drinks  ?  Comment: recovering alcoholic 8 mos sober as of 04/15/13  ? Drug use: No  ? Sexual activity: Yes  ?  Birth control/protection: Injection  ?  Comment: Depo Provera  ?Other Topics Concern  ? Not on file  ?Social History Narrative  ?   ? ?Social Determinants of  Health  ? ?Financial Resource Strain: Not on file  ?Food Insecurity: Not on file  ?Transportation Needs: Not on file  ?Physical Activity: Not on file  ?Stress: Not on file  ?Social Connections: Not on file  ?Intimate Partner Violence: Not on file  ? ? ? ?History reviewed. No pertinent family history. ?  ?Review of Systems ?Pertinent items are noted in HPI. ? ?Objective: ?Vital signs in last 24 hours: ? ?  08/07/2021  ?  2:34 PM 08/06/2021  ? 11:15 AM 06/12/2021  ?  9:30 AM  ?Vitals with BMI  ?Height  '5\' 5"'$    ?Weight 219 lbs 219 lbs   ?BMI 36.44 36.44   ?Systolic   852  ?Diastolic   84  ?Pulse   103  ? ? ?  ?EXAM: ?General: ?Well nourished, well developed. Awake, alert and oriented to time, place, person. Normal mood and affect. No apparent distress. Breathing room air. ? ?Left Lower Extremity: ?Alignment -  Neutral ?Deformity - None ?Skin intact ?Tenderness to palpation - Peroneal tendons retromalleolar ?Normal range of motion ?5/5 TA, PT, GS, Per, EHL, FHL ?Sensation intact to light touch throughout ?Palpable DP and PT pulses ?Special testing: Pain with resisted eversion  ? ?The contralateral foot/ankle was examined for comparison and noted to be neurovascularly intact with no localized deformity, swelling, or tenderness. ? ?Imaging Review ?X-rays taken were independently reviewed by me demonstrating normal findings. ?MRI series taken were independently reviewed by me demonstrating peroneus brevis tear. ? ?Assessment/Plan: ?The clinical and radiographic findings were reviewed and discussed at length with the patient. ? ?The patient has left peroneal tenosynovitis with longitudinal tear of brevis. ? ?We spoke at length about the natural course of these findings. We discussed nonoperative and operative treatment options in detail. ? ?I have recommended the following: ?Surgery in the form of left peroneals open debridement, possible repair and/or tendon transfer. The risks and benefits were presented and reviewed. The risks due to infection, stiffness, nerve/vessel/tendon injury, wound healing issues, failure of this surgery, need for further surgery, thromboembolic events, amputation, death among others were discussed. The patient acknowledged the explanation and agreed to proceed with the plan. ? ?Armond Hang  ?Orthopaedic Surgery ?[facility]  ?

## 2021-08-14 ENCOUNTER — Encounter (HOSPITAL_BASED_OUTPATIENT_CLINIC_OR_DEPARTMENT_OTHER)
Admission: RE | Disposition: A | Discharge status: Home / Self Care | Payer: Self-pay | Source: Home / Self Care | Attending: Orthopaedic Surgery

## 2021-08-14 ENCOUNTER — Encounter (HOSPITAL_BASED_OUTPATIENT_CLINIC_OR_DEPARTMENT_OTHER): Payer: Self-pay | Admitting: Orthopaedic Surgery

## 2021-08-14 ENCOUNTER — Ambulatory Visit (HOSPITAL_BASED_OUTPATIENT_CLINIC_OR_DEPARTMENT_OTHER): Payer: Commercial Managed Care - HMO | Admitting: Certified Registered"

## 2021-08-14 ENCOUNTER — Ambulatory Visit (HOSPITAL_BASED_OUTPATIENT_CLINIC_OR_DEPARTMENT_OTHER)
Admission: RE | Admit: 2021-08-14 | Discharge: 2021-08-14 | Disposition: A | Discharge status: Home / Self Care | Payer: Commercial Managed Care - HMO | Attending: Orthopaedic Surgery | Admitting: Orthopaedic Surgery

## 2021-08-14 ENCOUNTER — Other Ambulatory Visit: Payer: Self-pay

## 2021-08-14 ENCOUNTER — Ambulatory Visit (HOSPITAL_BASED_OUTPATIENT_CLINIC_OR_DEPARTMENT_OTHER): Payer: Commercial Managed Care - HMO

## 2021-08-14 DIAGNOSIS — J45909 Unspecified asthma, uncomplicated: Secondary | ICD-10-CM | POA: Insufficient documentation

## 2021-08-14 DIAGNOSIS — E669 Obesity, unspecified: Secondary | ICD-10-CM | POA: Insufficient documentation

## 2021-08-14 DIAGNOSIS — S86312A Strain of muscle(s) and tendon(s) of peroneal muscle group at lower leg level, left leg, initial encounter: Secondary | ICD-10-CM

## 2021-08-14 DIAGNOSIS — M65862 Other synovitis and tenosynovitis, left lower leg: Secondary | ICD-10-CM | POA: Diagnosis not present

## 2021-08-14 DIAGNOSIS — M659 Synovitis and tenosynovitis, unspecified: Secondary | ICD-10-CM | POA: Diagnosis not present

## 2021-08-14 DIAGNOSIS — I10 Essential (primary) hypertension: Secondary | ICD-10-CM | POA: Diagnosis not present

## 2021-08-14 DIAGNOSIS — Z6835 Body mass index (BMI) 35.0-35.9, adult: Secondary | ICD-10-CM | POA: Diagnosis not present

## 2021-08-14 DIAGNOSIS — Z87891 Personal history of nicotine dependence: Secondary | ICD-10-CM | POA: Insufficient documentation

## 2021-08-14 DIAGNOSIS — X58XXXA Exposure to other specified factors, initial encounter: Secondary | ICD-10-CM | POA: Insufficient documentation

## 2021-08-14 HISTORY — PX: ANKLE RECONSTRUCTION: SHX1151

## 2021-08-14 LAB — POCT PREGNANCY, URINE: Preg Test, Ur: NEGATIVE

## 2021-08-14 SURGERY — RECONSTRUCTION, ANKLE
Anesthesia: General | Site: Ankle | Laterality: Left

## 2021-08-14 MED ORDER — MIDAZOLAM HCL 2 MG/2ML IJ SOLN
INTRAMUSCULAR | Status: AC
  Filled 2021-08-14: qty 2

## 2021-08-14 MED ORDER — ACETAMINOPHEN 325 MG PO TABS: 325.0000 mg | ORAL_TABLET | ORAL | Status: DC | PRN

## 2021-08-14 MED ORDER — ONDANSETRON HCL 4 MG/2ML IJ SOLN
INTRAMUSCULAR | Status: AC
  Filled 2021-08-14: qty 2

## 2021-08-14 MED ORDER — CEFAZOLIN SODIUM-DEXTROSE 2-4 GM/100ML-% IV SOLN
2.0000 g | INTRAVENOUS | Status: AC
  Administered 2021-08-14: 2 g via INTRAVENOUS

## 2021-08-14 MED ORDER — ONDANSETRON HCL 4 MG/2ML IJ SOLN: 4.0000 mg | Freq: Once | INTRAMUSCULAR | Status: DC | PRN

## 2021-08-14 MED ORDER — DEXAMETHASONE SODIUM PHOSPHATE 10 MG/ML IJ SOLN
INTRAMUSCULAR | Status: DC | PRN
  Administered 2021-08-14: 10 mg

## 2021-08-14 MED ORDER — ACETAMINOPHEN 160 MG/5ML PO SOLN: 325.0000 mg | ORAL | Status: DC | PRN

## 2021-08-14 MED ORDER — ROPIVACAINE HCL 5 MG/ML IJ SOLN
INTRAMUSCULAR | Status: DC | PRN
  Administered 2021-08-14 (×6): 5 mL via PERINEURAL

## 2021-08-14 MED ORDER — VANCOMYCIN HCL 500 MG IV SOLR
INTRAVENOUS | Status: AC
  Filled 2021-08-14: qty 10

## 2021-08-14 MED ORDER — FENTANYL CITRATE (PF) 100 MCG/2ML IJ SOLN
INTRAMUSCULAR | Status: DC | PRN
  Administered 2021-08-14: 50 ug via INTRAVENOUS

## 2021-08-14 MED ORDER — MIDAZOLAM HCL 2 MG/2ML IJ SOLN
2.0000 mg | Freq: Once | INTRAMUSCULAR | Status: AC
  Administered 2021-08-14: 2 mg via INTRAVENOUS

## 2021-08-14 MED ORDER — FENTANYL CITRATE (PF) 100 MCG/2ML IJ SOLN
INTRAMUSCULAR | Status: AC
  Filled 2021-08-14: qty 2

## 2021-08-14 MED ORDER — KETOROLAC TROMETHAMINE 30 MG/ML IJ SOLN: 30.0000 mg | Freq: Once | INTRAMUSCULAR | Status: DC | PRN

## 2021-08-14 MED ORDER — OXYCODONE HCL 5 MG PO TABS: 5.0000 mg | ORAL_TABLET | Freq: Once | ORAL | Status: DC | PRN

## 2021-08-14 MED ORDER — FENTANYL CITRATE (PF) 100 MCG/2ML IJ SOLN
100.0000 ug | Freq: Once | INTRAMUSCULAR | Status: AC
  Administered 2021-08-14: 100 ug via INTRAVENOUS

## 2021-08-14 MED ORDER — ROPIVACAINE HCL 7.5 MG/ML IJ SOLN
INTRAMUSCULAR | Status: DC | PRN
  Administered 2021-08-14 (×4): 5 mL via PERINEURAL

## 2021-08-14 MED ORDER — CLONIDINE HCL (ANALGESIA) 100 MCG/ML EP SOLN
EPIDURAL | Status: DC | PRN
  Administered 2021-08-14: 100 ug

## 2021-08-14 MED ORDER — OXYCODONE HCL 5 MG/5ML PO SOLN: 5.0000 mg | Freq: Once | ORAL | Status: DC | PRN

## 2021-08-14 MED ORDER — ONDANSETRON HCL 4 MG/2ML IJ SOLN
INTRAMUSCULAR | Status: DC | PRN
  Administered 2021-08-14: 4 mg via INTRAVENOUS

## 2021-08-14 MED ORDER — DEXAMETHASONE SODIUM PHOSPHATE 10 MG/ML IJ SOLN
INTRAMUSCULAR | Status: DC | PRN
Start: 2021-08-14 — End: 2021-08-14
  Administered 2021-08-14: 8 mg via INTRAVENOUS

## 2021-08-14 MED ORDER — PROPOFOL 10 MG/ML IV BOLUS
INTRAVENOUS | Status: DC | PRN
  Administered 2021-08-14: 200 mg via INTRAVENOUS

## 2021-08-14 MED ORDER — PROPOFOL 500 MG/50ML IV EMUL
INTRAVENOUS | Status: AC
  Filled 2021-08-14: qty 50

## 2021-08-14 MED ORDER — LACTATED RINGERS IV SOLN: INTRAVENOUS | Status: DC

## 2021-08-14 MED ORDER — MEPERIDINE HCL 25 MG/ML IJ SOLN: 6.2500 mg | INTRAMUSCULAR | Status: DC | PRN

## 2021-08-14 MED ORDER — LIDOCAINE 2% (20 MG/ML) 5 ML SYRINGE
INTRAMUSCULAR | Status: AC
  Filled 2021-08-14: qty 5

## 2021-08-14 MED ORDER — FENTANYL CITRATE (PF) 100 MCG/2ML IJ SOLN: 25.0000 ug | INTRAMUSCULAR | Status: DC | PRN

## 2021-08-14 MED ORDER — BUPIVACAINE HCL (PF) 0.25 % IJ SOLN
INTRAMUSCULAR | Status: AC
  Filled 2021-08-14: qty 30

## 2021-08-14 MED ORDER — DEXAMETHASONE SODIUM PHOSPHATE 10 MG/ML IJ SOLN
INTRAMUSCULAR | Status: AC
  Filled 2021-08-14: qty 1

## 2021-08-14 SURGICAL SUPPLY — 64 items
APL PRP STRL LF DISP 70% ISPRP (MISCELLANEOUS) ×1
BANDAGE ESMARK 6X9 LF (GAUZE/BANDAGES/DRESSINGS) IMPLANT
BLADE SURG 15 STRL LF DISP TIS (BLADE) ×2 IMPLANT
BLADE SURG 15 STRL SS (BLADE) ×4
BNDG CMPR 9X6 STRL LF SNTH (GAUZE/BANDAGES/DRESSINGS) ×1
BNDG CMPR STD VLCR NS LF 5.8X4 (GAUZE/BANDAGES/DRESSINGS) ×1
BNDG ELASTIC 4X5.8 VLCR NS LF (GAUZE/BANDAGES/DRESSINGS) ×2 IMPLANT
BNDG ELASTIC 6X5.8 VLCR STR LF (GAUZE/BANDAGES/DRESSINGS) ×2 IMPLANT
BNDG ESMARK 6X9 LF (GAUZE/BANDAGES/DRESSINGS) ×2
BNDG GAUZE ELAST 4 BULKY (GAUZE/BANDAGES/DRESSINGS) ×2 IMPLANT
CANISTER SUCT 1200ML W/VALVE (MISCELLANEOUS) ×2 IMPLANT
CHLORAPREP W/TINT 26 (MISCELLANEOUS) ×2 IMPLANT
COVER BACK TABLE 60X90IN (DRAPES) ×2 IMPLANT
CUFF TOURN SGL QUICK 34 (TOURNIQUET CUFF) ×2
CUFF TRNQT CYL 34X4.125X (TOURNIQUET CUFF) ×1 IMPLANT
DRAPE C-ARM 42X72 X-RAY (DRAPES) ×1 IMPLANT
DRAPE C-ARMOR (DRAPES) ×2 IMPLANT
DRAPE EXTREMITY T 121X128X90 (DISPOSABLE) ×2 IMPLANT
DRAPE IMP U-DRAPE 54X76 (DRAPES) ×2 IMPLANT
DRAPE U-SHAPE 47X51 STRL (DRAPES) ×2 IMPLANT
DRSG PAD ABDOMINAL 8X10 ST (GAUZE/BANDAGES/DRESSINGS) ×10 IMPLANT
ELECT REM PT RETURN 9FT ADLT (ELECTROSURGICAL) ×2
ELECTRODE REM PT RTRN 9FT ADLT (ELECTROSURGICAL) ×1 IMPLANT
GAUZE SPONGE 4X4 12PLY STRL (GAUZE/BANDAGES/DRESSINGS) ×2 IMPLANT
GAUZE XEROFORM 1X8 LF (GAUZE/BANDAGES/DRESSINGS) ×2 IMPLANT
GLOVE BIO SURGEON STRL SZ7.5 (GLOVE) ×2 IMPLANT
GLOVE BIOGEL M STRL SZ7.5 (GLOVE) ×1 IMPLANT
GLOVE BIOGEL PI IND STRL 8 (GLOVE) ×1 IMPLANT
GLOVE BIOGEL PI INDICATOR 8 (GLOVE) ×1
GLOVE SURG SS PI 7.5 STRL IVOR (GLOVE) ×1 IMPLANT
GOWN STRL REUS W/ TWL LRG LVL3 (GOWN DISPOSABLE) ×2 IMPLANT
GOWN STRL REUS W/TWL LRG LVL3 (GOWN DISPOSABLE) ×4
NEEDLE HYPO 22GX1.5 SAFETY (NEEDLE) IMPLANT
NS IRRIG 1000ML POUR BTL (IV SOLUTION) ×2 IMPLANT
PACK BASIN DAY SURGERY FS (CUSTOM PROCEDURE TRAY) ×2 IMPLANT
PAD CAST 4YDX4 CTTN HI CHSV (CAST SUPPLIES) ×1 IMPLANT
PADDING CAST ABS 4INX4YD NS (CAST SUPPLIES)
PADDING CAST ABS COTTON 4X4 ST (CAST SUPPLIES) IMPLANT
PADDING CAST COTTON 4X4 STRL (CAST SUPPLIES) ×2
PADDING CAST COTTON 6X4 STRL (CAST SUPPLIES) ×2 IMPLANT
PADDING CAST SYNTHETIC 4 (CAST SUPPLIES) ×4
PADDING CAST SYNTHETIC 4X4 STR (CAST SUPPLIES) ×4 IMPLANT
PENCIL SMOKE EVACUATOR (MISCELLANEOUS) ×2 IMPLANT
SANITIZER HAND PURELL 535ML FO (MISCELLANEOUS) ×2 IMPLANT
SHEET MEDIUM DRAPE 40X70 STRL (DRAPES) ×2 IMPLANT
SLEEVE SCD COMPRESS KNEE MED (STOCKING) ×2 IMPLANT
SPIKE FLUID TRANSFER (MISCELLANEOUS) IMPLANT
SPLINT FAST PLASTER 5X30 (CAST SUPPLIES) ×20
SPLINT PLASTER CAST FAST 5X30 (CAST SUPPLIES) ×20 IMPLANT
SPONGE T-LAP 18X18 ~~LOC~~+RFID (SPONGE) ×2 IMPLANT
STOCKINETTE 6  STRL (DRAPES) ×2
STOCKINETTE 6 STRL (DRAPES) ×1 IMPLANT
SUCTION FRAZIER HANDLE 10FR (MISCELLANEOUS) ×2
SUCTION TUBE FRAZIER 10FR DISP (MISCELLANEOUS) IMPLANT
SUT ETHILON 3 0 PS 1 (SUTURE) ×2 IMPLANT
SUT MNCRL AB 3-0 PS2 18 (SUTURE) ×1 IMPLANT
SUT VIC AB 2-0 SH 27 (SUTURE) ×2
SUT VIC AB 2-0 SH 27XBRD (SUTURE) ×1 IMPLANT
SUT VICRYL 0 SH 27 (SUTURE) IMPLANT
SYR BULB EAR ULCER 3OZ GRN STR (SYRINGE) ×2 IMPLANT
SYR CONTROL 10ML LL (SYRINGE) IMPLANT
TOWEL GREEN STERILE FF (TOWEL DISPOSABLE) ×4 IMPLANT
TUBE CONNECTING 20X1/4 (TUBING) ×1 IMPLANT
UNDERPAD 30X36 HEAVY ABSORB (UNDERPADS AND DIAPERS) ×2 IMPLANT

## 2021-08-14 NOTE — Discharge Instructions (Addendum)
Kristin Hang, MD ?[facility] ? ?Please read the following information regarding your care after surgery. ? ?Medications  ?You only need a prescription for the narcotic pain medicine (ex. oxycodone, Percocet, Norco).  All of the other medicines listed below are available over the counter. ?? Aleve 2 pills twice a day for the first 3 days after surgery. ?? acetominophen (Tylenol) 650 mg every 4-6 hours as you need for minor to moderate pain ?? oxycodone as prescribed for severe pain ? ?? To help prevent blood clots, take aspirin (81 mg) twice daily for 42 days after surgery (or total duration of nonweightbearing).  You should also get up every hour while you are awake to move around. ? ?Weight Bearing ?? Do NOT bear any weight on the operated leg or foot. ? ?Cast / Splint / Dressing ?? If you have a splint, do NOT remove this. Keep your splint, cast or dressing clean and dry.  Don?t put anything (coat hanger, pencil, etc) down inside of it.  If it gets wet, call the office immediately to schedule an appointment for a cast change. ? ?Swelling ?IMPORTANT: It is normal for you to have swelling where you had surgery. To reduce swelling and pain, keep at least 3 pillows under your leg so that your toes are above your nose and your heel is above the level of your hip.  It may be necessary to keep your foot or leg elevated for several weeks.  This is critical to helping your incisions heal and your pain to feel better. ? ?Follow Up ?Call my office at [phone] when you are discharged from the hospital or surgery center to schedule an appointment to be seen 7-10 days after surgery. ? ?Call my office at [phone] if you develop a fever >101.5? F, nausea, vomiting, bleeding from the surgical site or severe pain.   ? ?=============== ?Post Anesthesia Home Care Instructions ? ?Activity: ?Get plenty of rest for the remainder of the day. A responsible individual must stay with you for 24 hours following the procedure.   ?For the next 24 hours, DO NOT: ?-Drive a car ?-Paediatric nurse ?-Drink alcoholic beverages ?-Take any medication unless instructed by your physician ?-Make any legal decisions or sign important papers. ? ?Meals: ?Start with liquid foods such as gelatin or soup. Progress to regular foods as tolerated. Avoid greasy, spicy, heavy foods. If nausea and/or vomiting occur, drink only clear liquids until the nausea and/or vomiting subsides. Call your physician if vomiting continues. ? ?Special Instructions/Symptoms: ?Your throat may feel dry or sore from the anesthesia or the breathing tube placed in your throat during surgery. If this causes discomfort, gargle with warm salt water. The discomfort should disappear within 24 hours. ? ?If you had a scopolamine patch placed behind your ear for the management of post- operative nausea and/or vomiting: ? ?1. The medication in the patch is effective for 72 hours, after which it should be removed.  Wrap patch in a tissue and discard in the trash. Wash hands thoroughly with soap and water. ?2. You may remove the patch earlier than 72 hours if you experience unpleasant side effects which may include dry mouth, dizziness or visual disturbances. ?3. Avoid touching the patch. Wash your hands with soap and water after contact with the patch. ?   Regional Anesthesia Blocks ? ?1. Numbness or the inability to move the "blocked" extremity may last from 3-48 hours after placement. The length of time depends on the medication injected and your individual  response to the medication. If the numbness is not going away after 48 hours, call your surgeon. ? ?2. The extremity that is blocked will need to be protected until the numbness is gone and the  Strength has returned. Because you cannot feel it, you will need to take extra care to avoid injury. Because it may be weak, you may have difficulty moving it or using it. You may not know what position it is in without looking at it while the  block is in effect. ? ?3. For blocks in the legs and feet, returning to weight bearing and walking needs to be done carefully. You will need to wait until the numbness is entirely gone and the strength has returned. You should be able to move your leg and foot normally before you try and bear weight or walk. You will need someone to be with you when you first try to ensure you do not fall and possibly risk injury. ? ?4. Bruising and tenderness at the needle site are common side effects and will resolve in a few days. ? ?5. Persistent numbness or new problems with movement should be communicated to the surgeon or the [place] [phone] [place] [phone]).  ?

## 2021-08-14 NOTE — Anesthesia Preprocedure Evaluation (Signed)
Anesthesia Evaluation  ?Patient identified by MRN, date of birth, ID band ?Patient awake ? ? ? ?Reviewed: ?Allergy & Precautions, NPO status , Patient's Chart, lab work & pertinent test results ? ?Airway ?Mallampati: I ? ? ? ? ? ? Dental ?no notable dental hx. ? ?  ?Pulmonary ?asthma , Current Smoker and Patient abstained from smoking.,  ?  ?Pulmonary exam normal ? ? ? ? ? ? ? Cardiovascular ?hypertension, Pt. on home beta blockers ?Normal cardiovascular exam ? ? ?  ?Neuro/Psych ? Headaches, PSYCHIATRIC DISORDERS Anxiety Depression Bipolar Disorder   ? GI/Hepatic ?Neg liver ROS,   ?Endo/Other  ? ? Renal/GU ?negative Renal ROS  ?negative genitourinary ?  ?Musculoskeletal ? ? Abdominal ?(+) + obese,   ?Peds ? Hematology ?  ?Anesthesia Other Findings ? ? Reproductive/Obstetrics ?negative OB ROS ? ?  ? ? ? ? ? ? ? ? ? ? ? ? ? ?  ?  ? ? ? ? ? ? ? ? ?Anesthesia Physical ?Anesthesia Plan ? ?ASA: 2 ? ?Anesthesia Plan: General  ? ?Post-op Pain Management: Regional block*  ? ?Induction: Intravenous ? ?PONV Risk Score and Plan: 2 and Ondansetron, Dexamethasone and Midazolam ? ?Airway Management Planned: LMA ? ?Additional Equipment: None ? ?Intra-op Plan:  ? ?Post-operative Plan: Extubation in OR ? ?Informed Consent: I have reviewed the patients History and Physical, chart, labs and discussed the procedure including the risks, benefits and alternatives for the proposed anesthesia with the patient or authorized representative who has indicated his/her understanding and acceptance.  ? ? ? ?Dental advisory given ? ?Plan Discussed with: CRNA ? ?Anesthesia Plan Comments:   ? ? ? ? ? ? ?Anesthesia Quick Evaluation ? ?

## 2021-08-14 NOTE — Op Note (Signed)
08/14/2021 ? ?10:21 AM ? ? ?PATIENT: Kristin Stephens  36 y.o. female ? ?MRN: [id number] ? ? ?PRE-OPERATIVE DIAGNOSIS:   ?Peroneal tear and tenosynovitis of left lower limb ? ? ?POST-OPERATIVE DIAGNOSIS:   ?Longitudinal tear of the left peroneus brevis and tenosynovitis of the left peroneus brevis and longus tendons ? ? ?PROCEDURE: ?1) Open debridement of left peroneus brevis and longus tendons with tenosynovectomy ?2) Left peroneus brevis primary repair and tubularization ? ? ?SURGEON:  Armond Hang, MD ? ? ?ASSISTANT: None ? ? ?ANESTHESIA: General, regional ? ? ?EBL: Minimal ? ? ?TOURNIQUET:   ? ?Total Tourniquet Time Documented: ?Thigh (Left) - 43 minutes ?Total: Thigh (Left) - 43 minutes ? ? ? ?COMPLICATIONS: None apparent ? ? ?DISPOSITION: Extubated, awake and stable to recovery. ? ? ?INDICATION FOR PROCEDURE: ?The patient presented with left peroneal tendon tenosynovitis of both brevis and longus with left peroneus brevis longitudinal tear not responding to nonoperative management for several months. ? ?We discussed the diagnosis, alternative treatment options, risks and benefits of the above surgical intervention, as well as alternative non-operative treatments. All questions/concerns were addressed and the patient/family demonstrated appropriate understanding of the diagnosis, the procedure, the postoperative course, and overall prognosis. The patient wished to proceed with surgical intervention and signed an informed surgical consent as such, in each others presence prior to surgery. ? ? ?PROCEDURE IN DETAIL: ?After preoperative consent was obtained and the correct operative site was identified, the patient was brought to the operating room supine on stretcher and transferred onto operating table. General anesthesia was induced. Preoperative antibiotics were administered. Surgical timeout was taken. The patient was then positioned supine with a large ipsilateral hip bump. The operative lower extremity  was prepped and draped in standard sterile fashion with a tourniquet around the thigh. The extremity was exsanguinated and the tourniquet was inflated to 275 mmHg. ? ?We began by making a longitudinal incision centered directly over the peroneal tendons in the retromalleolar region behind the lateral malleolus. Dissection was carried down to the level of the peroneal tendon sheath taking care to protect surrounding neurovascular structures. The tendon sheath was incised longitudinally in line with the tendons and copious amount of synovial fluid was released. We then inspected each peroneal tendon separately. The peroneus longus was noted to be completely intact. Tenosynovectomy was performed of this peroneus longus tendon. The peroneus brevis was carefully examined and noted to have a longitudinal tear with flattening of the tendon in this region. The degenerative portion of this tendon was excised (approximately 20% of the peroneus brevis tendon) as well as accompanying low lying muscle. Tenosynovectomy was performed of this peroneus brevis tendon. We then tubularized this peroneus brevis tendon as part of the repair. Finally, we repaired the previously incised peroneal tendon sheath after replacing both peroneus longus and brevis tendons in native positions. Both tendons were confirmed to be completely stable to intraoperative inversion and eversion testing. ? ?The surgical sites were thoroughly irrigated. The tourniquet was deflated and hemostasis achieved. The deep layers were closed using 2-0 vicryl and the subcutaneous tissues closed using 3-0 vicryl. The skin was closed without tension using 3-0 nylon suture.  ?  ?The leg was cleaned with saline and sterile xeroform dressings with gauze were applied. A well padded bulky short leg splint was applied. The patient was awakened from anesthesia and transported to the recovery room in stable condition.  ? ? ?FOLLOW UP PLAN: ?-transfer to PACU, then home ?-strict  NWB operative extremity, maximum elevation ?-maintain  short leg splint until follow up ?-DVT Ppx: Aspirin 81 mg twice daily while NWB ?-follow up as outpatient in 7-10 days for wound check and transition to short leg cast ?-sutures out in 2-3 weeks with transition to CAM boot in outpatient office at that time. Physical therapy is anticipated to begin at 6-8 weeks postop. ? ? ?RADIOGRAPHS: ?None utilized ? ? ?Armond Hang ?Orthopaedic Surgery ?[facility] ? ? ?

## 2021-08-14 NOTE — Anesthesia Postprocedure Evaluation (Signed)
Anesthesia Post Note ? ?Patient: Kristin Stephens ? ?Procedure(s) Performed: RECONSTRUCTION ANKLE Open debridement of left peroneal tendons (Left: Ankle) ? ?  ? ?Patient location during evaluation: Phase II ?Anesthesia Type: General and Regional ?Level of consciousness: awake ?Pain management: pain level controlled ?Vital Signs Assessment: post-procedure vital signs reviewed and stable ?Respiratory status: spontaneous breathing ?Cardiovascular status: stable ?Postop Assessment: no apparent nausea or vomiting ?Anesthetic complications: no ? ? ?No notable events documented. ? ?Last Vitals:  ?Vitals:  ? 08/14/21 1000 08/14/21 1015  ?BP: 109/73 109/65  ?Pulse: 68 65  ?Resp: (!) 9 15  ?Temp:    ?SpO2: 96% 97%  ?  ?Last Pain:  ?Vitals:  ? 08/14/21 1015  ?TempSrc:   ?PainSc: 0-No pain  ? ? ?LLE Motor Response: Purposeful movement (08/14/21 1015) ?LLE Sensation: Numbness (08/14/21 1015) ?  ?  ?  ?  ? ?Huston Foley ? ? ? ? ?

## 2021-08-14 NOTE — Anesthesia Procedure Notes (Signed)
Anesthesia Regional Block: Popliteal block  ? ?Pre-Anesthetic Checklist: , timeout performed,  Correct Patient, Correct Site, Correct Laterality,  Correct Procedure, Correct Position, site marked,  Risks and benefits discussed,  Surgical consent,  Pre-op evaluation,  At surgeon's request and post-op pain management ? ?Laterality: Lower and Left ? ?Prep: chloraprep     ?  ?Needles:  ?Injection technique: Single-shot ? ?Needle Type: Echogenic Stimulator Needle   ? ? ?Needle Length: 9cm  ?Needle Gauge: 20  ? ?Needle insertion depth: 2 cm ? ? ?Additional Needles: ? ? ?Procedures:,,,, ultrasound used (permanent image in chart),,    ?Narrative:  ?Start time: 08/14/2021 8:28 AM ?End time: 08/14/2021 8:34 AM ?Injection made incrementally with aspirations every 5 mL. ? ?Performed by: Personally  ?Anesthesiologist: Lyn Hollingshead, MD ? ? ? ? ?

## 2021-08-14 NOTE — H&P (Signed)
H&P Update: ? ?-History and Physical Reviewed ? ?-Patient has been re-examined ? ?-No change in the plan of care ? ?-The risks and benefits were presented and reviewed. The risks due to new/persistent infection, new/persistent instability, new/persistent tendon tearing, stiffness, nerve/vessel/tendon injury, wound healing issues, development of arthritis, failure of this surgery, possibility of delayed definitive surgery, need for further surgery, thromboembolic events, anesthesia/medical complications, amputation, death among others were discussed. The patient acknowledged the explanation, agreed to proceed with the plan and a consent was signed. ? ?Kristin Stephens ? ?

## 2021-08-14 NOTE — Anesthesia Procedure Notes (Signed)
Anesthesia Regional Block: Adductor canal block  ? ?Pre-Anesthetic Checklist: , timeout performed,  Correct Patient, Correct Site, Correct Laterality,  Correct Procedure, Correct Position, site marked,  Risks and benefits discussed,  Surgical consent,  Pre-op evaluation,  At surgeon's request and post-op pain management ? ?Laterality: Lower and Left ? ?Prep: chloraprep     ?  ?Needles:  ?Injection technique: Single-shot ? ?Needle Type: Echogenic Stimulator Needle   ? ? ?Needle Length: 9cm  ?Needle Gauge: 20  ? ?Needle insertion depth: 3 cm ? ? ?Additional Needles: ? ? ?Procedures:,,,, ultrasound used (permanent image in chart),,    ?Narrative:  ?Start time: 08/14/2021 8:20 AM ?End time: 08/14/2021 8:27 AM ?Injection made incrementally with aspirations every 5 mL. ? ?Performed by: Personally  ?Anesthesiologist: Lyn Hollingshead, MD ? ? ? ? ?

## 2021-08-14 NOTE — Anesthesia Procedure Notes (Signed)
Procedure Name: LMA Insertion ?Date/Time: 08/14/2021 9:02 AM ?Performed by: Tawni Millers, CRNA ?Pre-anesthesia Checklist: Patient identified, Emergency Drugs available, Suction available and Patient being monitored ?Patient Re-evaluated:Patient Re-evaluated prior to induction ?Oxygen Delivery Method: Circle system utilized ?Preoxygenation: Pre-oxygenation with 100% oxygen ?Induction Type: IV induction ?Ventilation: Mask ventilation without difficulty ?LMA: LMA inserted ?LMA Size: 4.0 ?Number of attempts: 1 ?Airway Equipment and Method: Bite block ?Placement Confirmation: positive ETCO2 ?Tube secured with: Tape ?Dental Injury: Teeth and Oropharynx as per pre-operative assessment  ? ? ? ? ?

## 2021-08-14 NOTE — Transfer of Care (Signed)
Immediate Anesthesia Transfer of Care Note ? ?Patient: Kristin Stephens ? ?Procedure(s) Performed: RECONSTRUCTION ANKLE Open debridement of left peroneal tendons (Left: Ankle) ? ?Patient Location: PACU ? ?Anesthesia Type:GA combined with regional for post-op pain ? ?Level of Consciousness: awake and alert  ? ?Airway & Oxygen Therapy: Patient Spontanous Breathing and Patient connected to face mask oxygen ? ?Post-op Assessment: Report given to RN and Post -op Vital signs reviewed and stable ? ?Post vital signs: Reviewed and stable ? ?Last Vitals:  ?Vitals Value Taken Time  ?BP 123/81 08/14/21 0956  ?Temp    ?Pulse 71 08/14/21 0957  ?Resp    ?SpO2 100 % 08/14/21 0957  ?Vitals shown include unvalidated device data. ? ?Last Pain:  ?Vitals:  ? 08/14/21 0653  ?TempSrc: Oral  ?PainSc:   ?   ? ?Patients Stated Pain Goal: 3 (08/14/21 1937) ? ?Complications: No notable events documented. ?

## 2021-08-14 NOTE — Progress Notes (Signed)
Assisted Dr. Hatchett with left, adductor canal, popliteal, ultrasound guided block. Side rails up, monitors on throughout procedure. See vital signs in flow sheet. Tolerated Procedure well. 

## 2021-08-15 ENCOUNTER — Encounter (HOSPITAL_BASED_OUTPATIENT_CLINIC_OR_DEPARTMENT_OTHER): Payer: Self-pay | Admitting: Orthopaedic Surgery

## 2021-08-15 NOTE — Progress Notes (Signed)
Left message stating courtesy call and if any questions or concerns please call the doctors office.  

## 2021-08-28 ENCOUNTER — Ambulatory Visit: Payer: Managed Care, Other (non HMO)

## 2021-08-30 ENCOUNTER — Ambulatory Visit (INDEPENDENT_AMBULATORY_CARE_PROVIDER_SITE_OTHER): Payer: Commercial Managed Care - HMO

## 2021-08-30 DIAGNOSIS — Z3042 Encounter for surveillance of injectable contraceptive: Secondary | ICD-10-CM | POA: Diagnosis not present

## 2021-08-30 MED ORDER — MEDROXYPROGESTERONE ACETATE 150 MG/ML IM SUSP
150.0000 mg | Freq: Once | INTRAMUSCULAR | Status: AC
  Administered 2021-08-30: 150 mg via INTRAMUSCULAR

## 2021-09-05 ENCOUNTER — Ambulatory Visit: Payer: Managed Care, Other (non HMO)

## 2021-09-11 ENCOUNTER — Telehealth (HOSPITAL_COMMUNITY): Payer: Self-pay | Admitting: *Deleted

## 2021-09-11 DIAGNOSIS — F900 Attention-deficit hyperactivity disorder, predominantly inattentive type: Secondary | ICD-10-CM

## 2021-09-11 MED ORDER — LISDEXAMFETAMINE DIMESYLATE 40 MG PO CAPS: 40.0000 mg | ORAL_CAPSULE | Freq: Every day | ORAL | 0 refills | Status: DC

## 2021-09-11 NOTE — Telephone Encounter (Signed)
Pt called requesting a refill of Vyvanse 40 mg last scripted on 07/25/21. Pt has an upcoming appointment on 11/06/21. Please review.

## 2021-09-11 NOTE — Telephone Encounter (Signed)
Done

## 2021-09-23 ENCOUNTER — Ambulatory Visit: Payer: Medicaid Other | Admitting: Obstetrics and Gynecology

## 2021-09-24 NOTE — Progress Notes (Deleted)
36 y.o. G48P1001 Domestic Partner Caucasian female here for annual exam.    PCP:     No LMP recorded. Patient has had an injection.           Sexually active: {yes no:314532}  The current method of family planning is ***Depo-Provera injections.    Exercising: {yes no:314532}  {types:19826} Smoker:  {YES NO:22349}  Health Maintenance: Pap:  09-19-20 Neg:Neg HR HPV, 2020 normal per patient, 10-26-14 Neg:Neg HR HPV History of abnormal Pap:  yes, Hx of cryotherapy to cervix age 80. Normal paps since. MMG:  n/a Colonoscopy:   05-20-13 normal except for hemorrhoids. Done due to rectal bleeding. BMD:   n/a  Result  n/a TDaP:  12-02-12 Gardasil:   completed HIV: 09-19-20 NR Hep C: 09-19-20 Neg Screening Labs:  Hb today: ***, Urine today: ***   reports that she has been smoking cigarettes. She started smoking about 19 years ago. She has a 5.00 pack-year smoking history. She has never used smokeless tobacco. She reports that she does not drink alcohol and does not use drugs.  Past Medical History:  Diagnosis Date   Abnormal Pap smear of cervix    age 4 with hx of cryotherapy   ADHD (attention deficit hyperactivity disorder)    Anxiety    Asthma    daily and prn inhalers   Bipolar affective disorder ([place])    COVID 05/2020   Dental crowns present    Depression    Deviated nasal septum 05/2012   Eczema    right leg   Esophageal reflux    Fibromyalgia    Heart murmur    Hypertension    IBS (irritable bowel syndrome)    Migraine headache    with aura   Nasal turbinate hypertrophy 05/2012   bilateral   OCD (obsessive compulsive disorder)    STD (sexually transmitted disease)    Hx of abnormal pap--age 67    Substance abuse ([place]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   TMJ syndrome     Past Surgical History:  Procedure Laterality Date   ANKLE RECONSTRUCTION Left 08/14/2021   Procedure: RECONSTRUCTION ANKLE Open debridement of left peroneal tendons;  Surgeon: Armond Hang, MD;  Location:  [place];  Service: Orthopedics;  Laterality: Left;   ASD AND VSD [place]  06/18/2006   CESAREAN SECTION  04/12/2009   COARCTATION OF AORTA REPAIR  1989   COLONOSCOPY N/A 05/20/2013   Procedure: COLONOSCOPY;  Surgeon: Danie Binder, MD;  [facility];  Service: Endoscopy;  Laterality: N/A;  115-moved to [place] notified pt   ESOPHAGOGASTRODUODENOSCOPY  04/04/2008     Normal esophagus/Mild patchy erythema in the antrum/ Normal duodenal bulb, normal small bowel biopsy   FLEXIBLE SIGMOIDOSCOPY  04/04/2008     Small internal hemorrhoids (poor bowel prep)   HEMORRHOID BANDING N/A 05/20/2013   Procedure: HEMORRHOID BANDING;  Surgeon: Danie Binder, MD;  [facility];  Service: Endoscopy;  Laterality: N/A;   NASAL SEPTOPLASTY W/ TURBINOPLASTY Bilateral 05/31/2012   Procedure: NASAL SEPTOPLASTY WITH TURBINATE REDUCTION;  Surgeon: Ascencion Dike, MD;  Location: [place];  Service: ENT;  Laterality: Bilateral;    Current Outpatient Medications  Medication Sig Dispense Refill   acetaminophen (TYLENOL) 500 MG tablet Take 1,000-2,000 mg by mouth every 6 (six) hours as needed for mild pain, moderate pain, fever or headache.     albuterol (VENTOLIN HFA) 108 (90 Base)  MCG/ACT inhaler Inhale 2 puffs into the lungs every 6 (six) hours as needed for wheezing or shortness of breath (Cough). 18 g 0   ARIPiprazole (ABILIFY) 15 MG tablet Take 1 tablet (15 mg total) by mouth daily. 30 tablet 2   aspirin 81 MG chewable tablet Chew by mouth daily. (Patient not taking: Reported on 08/08/2021)     FLUoxetine (PROZAC) 40 MG capsule Take 1 capsule (40 mg total) by mouth daily. 30 capsule 2   fluticasone (FLONASE) 50 MCG/ACT nasal spray USE 2 SPRAYS IN EACH NOSTRIL EVERY DAY (Patient taking differently: Place 2 sprays into both nostrils daily.) 16 g 5   hydrOXYzine (ATARAX) 25 MG tablet Take 1 tablet (25 mg total) by mouth daily as needed  for anxiety. 30 tablet 0   ibuprofen (ADVIL) 200 MG tablet Take 200 mg by mouth every 6 (six) hours as needed for mild pain or headache.     lamoTRIgine (LAMICTAL) 200 MG tablet Take 1 tablet (200 mg total) by mouth at bedtime. 30 tablet 2   lisdexamfetamine (VYVANSE) 40 MG capsule Take 1 capsule (40 mg total) by mouth daily. 30 capsule 0   loratadine (CLARITIN) 10 MG tablet Take 1 tablet by mouth daily as needed for allergies.     medroxyPROGESTERone (DEPO-PROVERA) 150 MG/ML injection Inject 150 mg into the muscle every 3 (three) months.     metoprolol succinate (TOPROL-XL) 25 MG 24 hr tablet Take 1.5 tablets (37.5 mg total) by mouth daily. 135 tablet 3   No current facility-administered medications for this visit.    Family History  Problem Relation Age of Onset   Hyperlipidemia Mother    Hypertension Mother    Depression Mother    Physical abuse Mother    Sexual abuse Mother        possibly   Sleep apnea Mother    Stroke Mother    Emphysema Paternal Grandmother    Heart disease Paternal Grandmother    Bipolar disorder Paternal Grandmother    Dementia Paternal Grandmother    Heart disease Maternal Grandmother    Dementia Maternal Grandmother    Stroke Maternal Grandfather    Prostate cancer Maternal Grandfather    Thyroid disease Maternal Grandfather    Personality disorder Father    Bipolar disorder Father    Alcohol abuse Father    ADD / ADHD Sister    Anxiety disorder Brother    ADD / ADHD Brother    Seizures Daughter    Drug abuse Neg Hx    OCD Neg Hx    Paranoid behavior Neg Hx    Schizophrenia Neg Hx     Review of Systems  Exam:   There were no vitals taken for this visit.    General appearance: alert, cooperative and appears stated age Head: normocephalic, without obvious abnormality, atraumatic Neck: no adenopathy, supple, symmetrical, trachea midline and thyroid normal to inspection and palpation Lungs: clear to auscultation bilaterally Breasts: normal  appearance, no masses or tenderness, No nipple retraction or dimpling, No nipple discharge or bleeding, No axillary adenopathy Heart: regular rate and rhythm Abdomen: soft, non-tender; no masses, no organomegaly Extremities: extremities normal, atraumatic, no cyanosis or edema Skin: skin color, texture, turgor normal. No rashes or lesions Lymph nodes: cervical, supraclavicular, and axillary nodes normal. Neurologic: grossly normal  Pelvic: External genitalia:  no lesions              No abnormal inguinal nodes palpated.  Urethra:  normal appearing urethra with no masses, tenderness or lesions              Bartholins and Skenes: normal                 Vagina: normal appearing vagina with normal color and discharge, no lesions              Cervix: no lesions              Pap taken: {yes no:314532} Bimanual Exam:  Uterus:  normal size, contour, position, consistency, mobility, non-tender              Adnexa: no mass, fullness, tenderness              Rectal exam: {yes no:314532}.  Confirms.              Anus:  normal sphincter tone, no lesions  Chaperone was present for exam:  ***  Assessment:   Well woman visit with gynecologic exam.   Plan: Mammogram screening discussed. Self breast awareness reviewed. Pap and HR HPV as above. Guidelines for Calcium, Vitamin D, regular exercise program including cardiovascular and weight bearing exercise.   Follow up annually and prn.   Additional counseling given.  {yes [id number]. _______ minutes face to face time of which over 50% was spent in counseling.    After visit summary provided.

## 2021-09-25 ENCOUNTER — Ambulatory Visit: Payer: Self-pay | Admitting: Obstetrics and Gynecology

## 2021-09-25 DIAGNOSIS — Z0289 Encounter for other administrative examinations: Secondary | ICD-10-CM

## 2021-10-07 ENCOUNTER — Telehealth (HOSPITAL_COMMUNITY): Payer: Self-pay

## 2021-10-07 ENCOUNTER — Encounter (HOSPITAL_COMMUNITY): Payer: Self-pay

## 2021-10-07 DIAGNOSIS — F900 Attention-deficit hyperactivity disorder, predominantly inattentive type: Secondary | ICD-10-CM

## 2021-10-07 MED ORDER — LISDEXAMFETAMINE DIMESYLATE 40 MG PO CAPS: 40.0000 mg | ORAL_CAPSULE | Freq: Every day | ORAL | 0 refills | Status: DC

## 2021-10-07 NOTE — Telephone Encounter (Signed)
Opened in error

## 2021-10-07 NOTE — Telephone Encounter (Signed)
Done. She can pick up on her due date.

## 2021-10-07 NOTE — Telephone Encounter (Signed)
Medication refill - Message left for patient that Dr. Adele Schilder had sent in her requested new Vyvanse order to be filled when due to her [facility].  Patient to call back if any issues filling when due.

## 2021-10-07 NOTE — Telephone Encounter (Signed)
Medication refill - Patient left a message she is in need of a new Vyvanse order to be sent into her [facility], last provided 09/11/21 and patient returns next on 11/16/21.

## 2021-10-22 ENCOUNTER — Other Ambulatory Visit (HOSPITAL_COMMUNITY): Payer: Self-pay | Admitting: *Deleted

## 2021-10-22 DIAGNOSIS — F419 Anxiety disorder, unspecified: Secondary | ICD-10-CM

## 2021-10-22 MED ORDER — HYDROXYZINE HCL 25 MG PO TABS: 25.0000 mg | ORAL_TABLET | Freq: Every day | ORAL | 0 refills | Status: DC | PRN

## 2021-10-29 ENCOUNTER — Other Ambulatory Visit: Payer: Self-pay | Admitting: Internal Medicine

## 2021-11-06 ENCOUNTER — Encounter (HOSPITAL_COMMUNITY): Payer: Self-pay | Admitting: Psychiatry

## 2021-11-06 ENCOUNTER — Ambulatory Visit: Payer: Commercial Managed Care - HMO

## 2021-11-06 ENCOUNTER — Telehealth (HOSPITAL_BASED_OUTPATIENT_CLINIC_OR_DEPARTMENT_OTHER): Payer: Commercial Managed Care - HMO | Admitting: Psychiatry

## 2021-11-06 DIAGNOSIS — F900 Attention-deficit hyperactivity disorder, predominantly inattentive type: Secondary | ICD-10-CM

## 2021-11-06 DIAGNOSIS — F3131 Bipolar disorder, current episode depressed, mild: Secondary | ICD-10-CM

## 2021-11-06 DIAGNOSIS — F419 Anxiety disorder, unspecified: Secondary | ICD-10-CM

## 2021-11-06 MED ORDER — HYDROXYZINE HCL 25 MG PO TABS: 25.0000 mg | ORAL_TABLET | Freq: Every day | ORAL | 0 refills | Status: DC | PRN

## 2021-11-06 MED ORDER — ARIPIPRAZOLE 15 MG PO TABS: 15.0000 mg | ORAL_TABLET | Freq: Every day | ORAL | 2 refills | Status: DC

## 2021-11-06 MED ORDER — FLUOXETINE HCL 40 MG PO CAPS: 40.0000 mg | ORAL_CAPSULE | Freq: Every day | ORAL | 2 refills | Status: DC

## 2021-11-06 MED ORDER — LAMOTRIGINE 200 MG PO TABS: 200.0000 mg | ORAL_TABLET | Freq: Every day | ORAL | 2 refills | Status: DC

## 2021-11-06 MED ORDER — LISDEXAMFETAMINE DIMESYLATE 40 MG PO CAPS: 40.0000 mg | ORAL_CAPSULE | Freq: Every day | ORAL | 0 refills | Status: DC

## 2021-11-06 NOTE — Progress Notes (Signed)
Virtual Visit via Telephone Note  I connected with Kristin Stephens on 11/06/21 at  2:00 PM EDT by telephone and verified that I am speaking with the correct person using two identifiers.  Location: Patient: Work Provider: Biomedical scientist   I discussed the limitations, risks, security and privacy concerns of performing an evaluation and management service by telephone and the availability of in person appointments. I also discussed with the patient that there may be a patient responsible charge related to this service. The patient expressed understanding and agreed to proceed.   History of Present Illness: Patient is evaluated by phone session.  She recently had procedure for debridement of the tendons.  She is still on Ace braces and wearing tennis shoes but her pain has been better than before.  She is hoping the next few weeks able to come out from brace and able to wear heels.  She reported to have a lot of anxiety few weeks ago and she requested hydroxyzine because she was sad, tearful but did not endorse any stressors.  The only thing she recall it could be anesthesia for the procedure.  She is feeling better now.  She is working full time at [employer].  Today she is complaining about throat pain and one of her coworker had [place].  She had a plan to visit her mother on this weekend but she will reschedule because does not want to her mother to get sick.  Her mother is taking medication that is helping her chronic leukemia.  Patient denies any mania, psychosis, hallucination.  She is sleeping okay with the hydroxyzine which she takes now daily basis.  She has no more crying spells.  Her energy level is okay.  Her appetite is okay.  She had a good relationship with the boyfriend is also very supportive.  She takes Vyvanse Monday to Friday when she works.  She has no tremor or shakes or any EPS.  Her 65 year old daughter is going to start seventh grade and she is happy about it.  Patient denies  any panic attack.    Past Psychiatric History:  H/O mania, depression, anger, overdose and slashing her wrist.  Did IOP in 2016. Inpatient in October 2018. H/O ETOH. Took Adderall, Ativan, Gabapentin, Wellbutrin but limited response.     Psychiatric Specialty Exam: Physical Exam  Review of Systems  Weight 223 lb (101.2 kg).There is no height or weight on file to calculate BMI.  General Appearance: NA  Eye Contact:  NA  Speech:  Normal Rate  Volume:  Normal  Mood:  Anxious  Affect:  NA  Thought Process:  Goal Directed  Orientation:  Full (Time, Place, and Person)  Thought Content:  Rumination  Suicidal Thoughts:  No  Homicidal Thoughts:  No  Memory:  Immediate;   Good Recent;   Good Remote;   Good  Judgement:  Intact  Insight:  Present  Psychomotor Activity:  NA  Concentration:  Concentration: Fair and Attention Span: Fair  Recall:  Good  Fund of Knowledge:  Good  Language:  Good  Akathisia:  No  Handed:  Right  AIMS (if indicated):     Assets:  Communication Skills Desire for Improvement Housing Social Support Talents/Skills Transportation  ADL's:  Intact  Cognition:  WNL  Sleep:   better with hydroxyzine      Assessment and Plan: Bipolar disorder type I.  Attention deficit disorder, inattentive type.  Anxiety.  Discussed episodic anxiety after the procedure which is now  subsided.  She is taking hydroxyzine and I recommended to take only as needed since things are much better.  Discussed family situation as mother getting treatment for her chronic leukemia and fairly better.  I will continue Vyvanse 40 mg to take Monday to Friday, Prozac 40 mg daily, Abilify 15 mg daily, Lamictal 200 mg daily and recommended take hydroxyzine only when she feels very nervous or anxious.  Recommended to call us back if she has any question or any concern.  Follow-up in 3 months.  Follow Up Instructions:    I discussed the assessment and treatment plan with the patient. The patient  was provided an opportunity to ask questions and all were answered. The patient agreed with the plan and demonstrated an understanding of the instructions.   The patient was advised to call back or seek an in-person evaluation if the symptoms worsen or if the condition fails to improve as anticipated.  Collaboration of Care: Other provider involved in patient's care AEB notes are available in epic to review.  Patient/Guardian was advised Release of Information must be obtained prior to any record release in order to collaborate their care with an outside provider. Patient/Guardian was advised if they have not already done so to contact the registration department to sign all necessary forms in order for [place] to release information regarding their care.   Consent: Patient/Guardian gives verbal consent for treatment and assignment of benefits for services provided during this visit. Patient/Guardian expressed understanding and agreed to proceed.    I provided 18 minutes of non-face-to-face time during this encounter.   Kathlee Nations, MD

## 2021-11-19 ENCOUNTER — Ambulatory Visit: Payer: Commercial Managed Care - HMO

## 2021-11-19 NOTE — Progress Notes (Deleted)
Patient is here for Depo Provera Injection Patient is within Depo Provera Calender Limits YES Next Depo Due between: 11/7 THRU 02/18/22 Last AEX: 09-19-20 AEX Scheduled: None. Patient notified needs to schedule AEX before receiving next Depo  Patient is aware when next depo is due  Pt tolerated Injection well.  Routed to provider for review, encounter closed.

## 2021-11-21 NOTE — Progress Notes (Signed)
36 y.o. G42P1001 Domestic Partner Caucasian female here for annual exam.    Patient wants Depo today. Was due last week.  Does not have menses and she feels it helps stabilize her mood.   Mother dx with CML. She is doing well.  Engaged.  Declines STD screening.   Sees Dr. Harrington Challenger for her cardiology care.  PCP:  Dr. Burnard Bunting   No LMP recorded. Patient has had an injection.           Sexually active: Yes.    The current method of family planning is Depo-Provera injections.    Exercising: Yes.     Physical therapy Smoker:  yes, 0.5 ppd  Health Maintenance: Pap:  09-19-20 Neg:Neg HR HPV, 2020 normal per patient, 10-26-14 Neg:Neg HR HPV History of abnormal Pap:  yes,  hx of abnormal pap with cryotherapy to cervix age 64 per patient--normal since. MMG:  n/a Colonoscopy: 05-20-13 normal except for hemorrhoids. Done due to rectal bleeding. BMD:  n/a  Result  n/a TDaP:  12-02-12 Gardasil:   COMPLETED HIV: 09-19-20 NR Hep C: 09-19-20 Neg Screening Labs:  PCP   reports that she has been smoking cigarettes. She started smoking about 19 years ago. She has a 5.00 pack-year smoking history. She has never used smokeless tobacco. She reports that she does not drink alcohol and does not use drugs.  Past Medical History:  Diagnosis Date   Abnormal Pap smear of cervix    age 48 with hx of cryotherapy   ADHD (attention deficit hyperactivity disorder)    Anxiety    Asthma    daily and prn inhalers   Bipolar affective disorder ([place])    COVID 05/2020   Dental crowns present    Depression    Deviated nasal septum 05/2012   Eczema    right leg   Esophageal reflux    Fibromyalgia    Heart murmur    Hypertension    IBS (irritable bowel syndrome)    Migraine headache    with aura   Nasal turbinate hypertrophy 05/2012   bilateral   OCD (obsessive compulsive disorder)    STD (sexually transmitted disease)    Hx of abnormal pap--age 33    Substance abuse ([place]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    TMJ syndrome     Past Surgical History:  Procedure Laterality Date   ANKLE RECONSTRUCTION Left 08/14/2021   Procedure: RECONSTRUCTION ANKLE Open debridement of left peroneal tendons;  Surgeon: Armond Hang, MD;  Location: [place];  Service: Orthopedics;  Laterality: Left;   ASD AND VSD [place]  06/18/2006   CESAREAN SECTION  04/12/2009   COARCTATION OF AORTA REPAIR  1989   COLONOSCOPY N/A 05/20/2013   Procedure: COLONOSCOPY;  Surgeon: Danie Binder, MD;  [facility];  Service: Endoscopy;  Laterality: N/A;  115-moved to [place] [facility] notified pt   ESOPHAGOGASTRODUODENOSCOPY  04/04/2008     Normal esophagus/Mild patchy erythema in the antrum/ Normal duodenal bulb, normal small bowel biopsy   FLEXIBLE SIGMOIDOSCOPY  04/04/2008     Small internal hemorrhoids (poor bowel prep)   HEMORRHOID BANDING N/A 05/20/2013   Procedure: HEMORRHOID BANDING;  Surgeon: Danie Binder, MD;  [facility];  Service: Endoscopy;  Laterality: N/A;   NASAL SEPTOPLASTY W/ TURBINOPLASTY Bilateral 05/31/2012   Procedure: NASAL SEPTOPLASTY WITH TURBINATE REDUCTION;  Surgeon: Ascencion Dike, MD;  Location: [place];  Service: ENT;  Laterality: Bilateral;    Current Outpatient Medications  Medication Sig Dispense Refill   acetaminophen (TYLENOL) 500 MG tablet Take 1,000-2,000 mg by mouth every 6 (six) hours as needed for mild pain, moderate pain, fever or headache.     ARIPiprazole (ABILIFY) 15 MG tablet Take 1 tablet (15 mg total) by mouth daily. 30 tablet 2   aspirin 81 MG chewable tablet Chew by mouth daily.     diclofenac Sodium (VOLTAREN) 1 % GEL Apply topically 4 (four) times daily.     FLUoxetine (PROZAC) 40 MG capsule Take 1 capsule (40 mg total) by mouth daily. 30 capsule 2   fluticasone (FLONASE) 50 MCG/ACT nasal spray USE 2 SPRAYS IN EACH NOSTRIL EVERY DAY (Patient taking differently: Place 2 sprays into both nostrils  daily.) 16 g 5   hydrOXYzine (ATARAX) 25 MG tablet Take 1 tablet by mouth daily as needed.     ibuprofen (ADVIL) 200 MG tablet Take 200 mg by mouth every 6 (six) hours as needed for mild pain or headache.     ibuprofen (ADVIL) 800 MG tablet      lamoTRIgine (LAMICTAL) 200 MG tablet Take 1 tablet (200 mg total) by mouth at bedtime. 30 tablet 2   lisdexamfetamine (VYVANSE) 40 MG capsule Take 1 capsule (40 mg total) by mouth daily. 30 capsule 0   loratadine (CLARITIN) 10 MG tablet Take 1 tablet by mouth daily as needed for allergies.     medroxyPROGESTERone (DEPO-PROVERA) 150 MG/ML injection Inject 150 mg into the muscle every 3 (three) months.     metoprolol succinate (TOPROL-XL) 25 MG 24 hr tablet TAKE ONE AND A HALF TABLETS BY MOUTH EVERY DAY 45 tablet 0   No current facility-administered medications for this visit.    Family History  Problem Relation Age of Onset   Cancer Mother        leukemia   Hyperlipidemia Mother    Hypertension Mother    Depression Mother    Physical abuse Mother    Sexual abuse Mother        possibly   Sleep apnea Mother    Stroke Mother    Personality disorder Father    Bipolar disorder Father    Alcohol abuse Father    ADD / ADHD Sister    Anxiety disorder Brother    ADD / ADHD Brother    Heart disease Maternal Grandmother    Dementia Maternal Grandmother    Stroke Maternal Grandfather    Prostate cancer Maternal Grandfather    Thyroid disease Maternal Grandfather    Emphysema Paternal Grandmother    Heart disease Paternal Grandmother    Bipolar disorder Paternal Grandmother    Dementia Paternal Grandmother    Seizures Daughter    Drug abuse Neg Hx    OCD Neg Hx    Paranoid behavior Neg Hx    Schizophrenia Neg Hx     Review of Systems  All other systems reviewed and are negative.   Exam:   BP 124/74   Pulse (!) 132   Ht 5' 5.25" (1.657 m)   Wt 213 lb (96.6 kg)   SpO2 98%   BMI 35.17 kg/m     General appearance: alert, cooperative  and appears stated age Head: normocephalic, without obvious abnormality, atraumatic Neck: no adenopathy, supple, symmetrical, trachea midline and thyroid normal to inspection and palpation Lungs: clear to auscultation bilaterally Breasts: normal appearance, no masses or tenderness, No nipple retraction or dimpling, No nipple discharge or bleeding, No  axillary adenopathy Heart: regular rate and rhythm Abdomen: soft, non-tender; no masses, no organomegaly Extremities: extremities normal, atraumatic, no cyanosis or edema Skin: skin color, texture, turgor normal. No rashes or lesions Lymph nodes: cervical, supraclavicular, and axillary nodes normal. Neurologic: grossly normal  Pelvic: External genitalia:  no lesions              No abnormal inguinal nodes palpated.              Urethra:  normal appearing urethra with no masses, tenderness or lesions              Bartholins and Skenes: normal                 Vagina: normal appearing vagina with normal color and discharge, no lesions              Cervix: no lesions              Pap taken: no Bimanual Exam:  Uterus:  normal size, contour, position, consistency, mobility, non-tender              Adnexa: no mass, fullness, tenderness               Chaperone was present for exam:  Estill Bamberg, CMA  Assessment:   Well woman visit with gynecologic exam. Depo Provera usage.  Smoker.  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 Bipolar disorder. Hx PP depression.   Plan: Mammogram screening discussed. Self breast awareness reviewed. Pap and HR HPV 2027. Guidelines for Calcium, Vitamin D, regular exercise program including cardiovascular and weight bearing exercise. Ok to continue Depo provera 150 mg IM every 3 months for one year.   Follow up annually and prn.   After visit summary provided.

## 2021-11-26 ENCOUNTER — Encounter: Payer: Self-pay | Admitting: Obstetrics and Gynecology

## 2021-11-26 ENCOUNTER — Ambulatory Visit (INDEPENDENT_AMBULATORY_CARE_PROVIDER_SITE_OTHER): Payer: Commercial Managed Care - HMO | Admitting: Obstetrics and Gynecology

## 2021-11-26 VITALS — BP 124/74 | HR 132 | Ht 65.25 in | Wt 213.0 lb

## 2021-11-26 DIAGNOSIS — Z01419 Encounter for gynecological examination (general) (routine) without abnormal findings: Secondary | ICD-10-CM

## 2021-11-26 DIAGNOSIS — Z3042 Encounter for surveillance of injectable contraceptive: Secondary | ICD-10-CM | POA: Diagnosis not present

## 2021-11-26 MED ORDER — MEDROXYPROGESTERONE ACETATE 150 MG/ML IM SUSP
150.0000 mg | Freq: Once | INTRAMUSCULAR | Status: AC
  Administered 2021-11-26: 150 mg via INTRAMUSCULAR

## 2021-11-26 NOTE — Patient Instructions (Signed)
EXERCISE AND DIET:  We recommended that you start or continue a regular exercise program for good health. Regular exercise means any activity that makes your heart beat faster and makes you sweat.  We recommend exercising at least 30 minutes per day at least 3 days a week, preferably 4 or 5.  We also recommend a diet low in fat and sugar.  Inactivity, poor dietary choices and obesity can cause diabetes, heart attack, stroke, and kidney damage, among others.    ALCOHOL AND SMOKING:  Women should limit their alcohol intake to no more than 7 drinks/beers/glasses of wine (combined, not each!) per week. Moderation of alcohol intake to this level decreases your risk of breast cancer and liver damage. And of course, no recreational drugs are part of a healthy lifestyle.  And absolutely no smoking or even second hand smoke. Most people know smoking can cause heart and lung diseases, but did you know it also contributes to weakening of your bones? Aging of your skin?  Yellowing of your teeth and nails?  CALCIUM AND VITAMIN D:  Adequate intake of calcium and Vitamin D are recommended.  The recommendations for exact amounts of these supplements seem to change often, but generally speaking 600 mg of calcium (either carbonate or citrate) and 800 units of Vitamin D per day seems prudent. Certain women may benefit from higher intake of Vitamin D.  If you are among these women, your doctor will have told you during your visit.    PAP SMEARS:  Pap smears, to check for cervical cancer or precancers,  have traditionally been done yearly, although recent scientific advances have shown that most women can have pap smears less often.  However, every woman still should have a physical exam from her gynecologist every year. It will include a breast check, inspection of the vulva and vagina to check for abnormal growths or skin changes, a visual exam of the cervix, and then an exam to evaluate the size and shape of the uterus and  ovaries.  And after 34 years of age, a rectal exam is indicated to check for rectal cancers. We will also provide age appropriate advice regarding health maintenance, like when you should have certain vaccines, screening for sexually transmitted diseases, bone density testing, colonoscopy, mammograms, etc.   MAMMOGRAMS:  All women over 40 years old should have a yearly mammogram. Many facilities now offer a "3D" mammogram, which may cost around $50 extra out of pocket. If possible,  we recommend you accept the option to have the 3D mammogram performed.  It both reduces the number of women who will be called back for extra views which then turn out to be normal, and it is better than the routine mammogram at detecting truly abnormal areas.    COLONOSCOPY:  Colonoscopy to screen for colon cancer is recommended for all women at age 50.  We know, you hate the idea of the prep.  We agree, BUT, having colon cancer and not knowing it is worse!!  Colon cancer so often starts as a polyp that can be seen and removed at colonscopy, which can quite literally save your life!  And if your first colonoscopy is normal and you have no family history of colon cancer, most women don't have to have it again for 10 years.  Once every ten years, you can do something that may end up saving your life, right?  We will be happy to help you get it scheduled when you are ready.    Be sure to check your insurance coverage so you understand how much it will cost.  It may be covered as a preventative service at no cost, but you should check your particular policy.    Calcium Content in Foods Calcium is the most abundant mineral in the body. Most of the body's calcium supply is stored in bones and teeth. Calcium helps many parts of the body function normally, including: Blood and blood vessels. Nerves. Hormones. Muscles. Bones and teeth. When your calcium stores are low, you may be at risk for low bone mass, bone loss, and broken bones  (fractures). When you get enough calcium, it helps to support strong bones and teeth throughout your life. Calcium is especially important for: Children during growth spurts. Girls during adolescence. Women who are pregnant or breastfeeding. Women after their menstrual cycle stops (postmenopause). Women whose menstrual cycle has stopped due to anorexia nervosa or regular intense exercise. People who cannot eat or digest dairy products. Vegans. Recommended daily amounts of calcium: Women (ages 19 to 50): 1,000 mg per day. Women (ages 51 and older): 1,200 mg per day. Men (ages 19 to 70): 1,000 mg per day. Men (ages 71 and older): 1,200 mg per day. Women (ages 9 to 18): 1,300 mg per day. Men (ages 9 to 18): 1,300 mg per day. General information Eat foods that are high in calcium. Try to get most of your calcium from food. Some people may benefit from taking calcium supplements. Check with your health care provider or diet and nutrition specialist (dietitian) before starting any calcium supplements. Calcium supplements may interact with certain medicines. Too much calcium may cause other health problems, such as constipation and kidney stones. For the body to absorb calcium, it needs vitamin D. Sources of vitamin D include: Skin exposure to direct sunlight. Foods, such as egg yolks, liver, mushrooms, saltwater fish, and fortified milk. Vitamin D supplements. Check with your health care provider or dietitian before starting any vitamin D supplements. What foods are high in calcium?  Foods that are high in calcium contain more than 100 milligrams per serving. Fruits Fortified orange juice or other fruit juice, 300 mg per 8 oz serving. Vegetables Collard greens, 360 mg per 8 oz serving. Kale, 100 mg per 8 oz serving. Bok choy, 160 mg per 8 oz serving. Grains Fortified ready-to-eat cereals, 100 to 1,000 mg per 8 oz serving. Fortified frozen waffles, 200 mg in 2 waffles. Oatmeal, 140 mg in  1 cup. Meats and other proteins Sardines, canned with bones, 325 mg per 3 oz serving. Salmon, canned with bones, 180 mg per 3 oz serving. Canned shrimp, 125 mg per 3 oz serving. Baked beans, 160 mg per 4 oz serving. Tofu, firm, made with calcium sulfate, 253 mg per 4 oz serving. Dairy Yogurt, plain, low-fat, 310 mg per 6 oz serving. Nonfat milk, 300 mg per 8 oz serving. American cheese, 195 mg per 1 oz serving. Cheddar cheese, 205 mg per 1 oz serving. Cottage cheese 2%, 105 mg per 4 oz serving. Fortified soy, rice, or almond milk, 300 mg per 8 oz serving. Mozzarella, part skim, 210 mg per 1 oz serving. The items listed above may not be a complete list of foods high in calcium. Actual amounts of calcium may be different depending on processing. Contact a dietitian for more information. What foods are lower in calcium? Foods that are lower in calcium contain 50 mg or less per serving. Fruits Apple, about 6 mg. Banana, about 12 mg.   Vegetables Lettuce, 19 mg per 2 oz serving. Tomato, about 11 mg. Grains Rice, 4 mg per 6 oz serving. Boiled potatoes, 14 mg per 8 oz serving. White bread, 6 mg per slice. Meats and other proteins Egg, 27 mg per 2 oz serving. Red meat, 7 mg per 4 oz serving. Chicken, 17 mg per 4 oz serving. Fish, cod, or trout, 20 mg per 4 oz serving. Dairy Cream cheese, regular, 14 mg per 1 Tbsp serving. Brie cheese, 50 mg per 1 oz serving. Parmesan cheese, 70 mg per 1 Tbsp serving. The items listed above may not be a complete list of foods lower in calcium. Actual amounts of calcium may be different depending on processing. Contact a dietitian for more information. Summary Calcium is an important mineral in the body because it affects many functions. Getting enough calcium helps support strong bones and teeth throughout your life. Try to get most of your calcium from food. Calcium supplements may interact with certain medicines. Check with your health care provider  or dietitian before starting any calcium supplements. This information is not intended to replace advice given to you by your health care provider. Make sure you discuss any questions you have with your health care provider. Document Revised: 07/13/2019 Document Reviewed: 07/13/2019 Elsevier Patient Education  2023 Elsevier Inc.  

## 2021-12-06 ENCOUNTER — Telehealth: Payer: Self-pay | Admitting: Internal Medicine

## 2021-12-06 DIAGNOSIS — Q251 Coarctation of aorta: Secondary | ICD-10-CM

## 2021-12-06 DIAGNOSIS — Q249 Congenital malformation of heart, unspecified: Secondary | ICD-10-CM

## 2021-12-06 NOTE — Telephone Encounter (Signed)
Pt called to schedule 12 month F/U (03/04/22)  and would like to know if provider would like for her to still have an MRI and ECHO done before coming in for F/U. Pt would like a callback regarding this matter . Please advise

## 2021-12-06 NOTE — Telephone Encounter (Signed)
Returned call to patient.  She is asking if Dr. Harrington Challenger would like for her to have a MR Angio Chest and echocardiogram performed prior to her appt with Dr. Harrington Challenger scheduled for 03/04/22.  Will forward to Dr. Harrington Challenger to advise on if she would like to order these two tests.  Patient also states she will need something for anxiety prior to MR Angio procedure.

## 2021-12-09 ENCOUNTER — Telehealth (HOSPITAL_COMMUNITY): Payer: Self-pay | Admitting: *Deleted

## 2021-12-09 DIAGNOSIS — F900 Attention-deficit hyperactivity disorder, predominantly inattentive type: Secondary | ICD-10-CM

## 2021-12-09 MED ORDER — LISDEXAMFETAMINE DIMESYLATE 40 MG PO CAPS: 40.0000 mg | ORAL_CAPSULE | Freq: Every day | ORAL | 0 refills | Status: DC

## 2021-12-09 NOTE — Telephone Encounter (Signed)
Yes. They will hold until fill date. Thanks.

## 2021-12-09 NOTE — Telephone Encounter (Signed)
[facility]  She need to pick up on her due date.  Cannot refill earlier.

## 2021-12-09 NOTE — Telephone Encounter (Signed)
Pt called requesting a refill of the Vyvanse 40 mg. Last filled 11/06/21. Writer advised pt to check with [facility] to see if medication is available. Pt called back stating that pharmacy dose of medication but can't guarantee how much longer it would be in stock. Please review and advise.

## 2021-12-10 ENCOUNTER — Other Ambulatory Visit: Payer: Self-pay | Admitting: Internal Medicine

## 2021-12-12 MED ORDER — LORAZEPAM 0.5 MG PO TABS: ORAL_TABLET | ORAL | 0 refills | Status: DC

## 2021-12-12 NOTE — Telephone Encounter (Addendum)
I spoke with the Kristin Stephens and she agrees with testing plan and also approval from Dr Harrington Challenger for Ativan orally prior to her MRI per the Kristin Stephens request.

## 2021-12-12 NOTE — Addendum Note (Signed)
Addended by: Stephani Police on: 12/12/2021 03:43 PM   Modules accepted: Orders

## 2021-12-12 NOTE — Telephone Encounter (Signed)
Per Dr Harrington Challenger:   Pt should have MRI to reevaluate aorta given Hx  last MRI was in 2017  Echoes since  Should have before I see her    Left a message for the pt to call back.

## 2021-12-13 ENCOUNTER — Encounter: Payer: Self-pay | Admitting: Internal Medicine

## 2022-01-01 MED ORDER — ALPRAZOLAM 0.25 MG PO TABS: ORAL_TABLET | ORAL | 0 refills | Status: DC

## 2022-01-02 ENCOUNTER — Ambulatory Visit (HOSPITAL_BASED_OUTPATIENT_CLINIC_OR_DEPARTMENT_OTHER): Admission: RE | Admit: 2022-01-02 | Payer: Commercial Managed Care - HMO | Source: Ambulatory Visit

## 2022-01-02 ENCOUNTER — Ambulatory Visit (HOSPITAL_COMMUNITY)
Admission: RE | Admit: 2022-01-02 | Discharge: 2022-01-02 | Disposition: A | Discharge status: Home / Self Care | Payer: Commercial Managed Care - HMO | Source: Ambulatory Visit | Attending: Internal Medicine | Admitting: Internal Medicine

## 2022-01-02 DIAGNOSIS — Q249 Congenital malformation of heart, unspecified: Secondary | ICD-10-CM | POA: Diagnosis present

## 2022-01-02 DIAGNOSIS — Q251 Coarctation of aorta: Secondary | ICD-10-CM | POA: Diagnosis present

## 2022-01-02 MED ORDER — GADOPICLENOL 0.5 MMOL/ML IV SOLN
9.5000 mL | Freq: Once | INTRAVENOUS | Status: AC | PRN
  Administered 2022-01-02: 9.5 mL via INTRAVENOUS

## 2022-01-06 ENCOUNTER — Telehealth (HOSPITAL_COMMUNITY): Payer: Self-pay | Admitting: *Deleted

## 2022-01-06 DIAGNOSIS — F900 Attention-deficit hyperactivity disorder, predominantly inattentive type: Secondary | ICD-10-CM

## 2022-01-06 MED ORDER — LISDEXAMFETAMINE DIMESYLATE 40 MG PO CAPS: 40.0000 mg | ORAL_CAPSULE | Freq: Every day | ORAL | 0 refills | Status: DC

## 2022-01-06 NOTE — Telephone Encounter (Signed)
Send

## 2022-01-06 NOTE — Telephone Encounter (Signed)
Pt called requesting a refill of the Vyvanse 40 mg. Last fill date 12/12/21. Please send to [facility] per usual. Pt has an upcoming appointment on 02/06/22. Please review.

## 2022-02-05 ENCOUNTER — Telehealth (HOSPITAL_COMMUNITY): Payer: Commercial Managed Care - HMO | Admitting: Psychiatry

## 2022-02-06 ENCOUNTER — Encounter (HOSPITAL_COMMUNITY): Payer: Self-pay | Admitting: Psychiatry

## 2022-02-06 ENCOUNTER — Telehealth (HOSPITAL_BASED_OUTPATIENT_CLINIC_OR_DEPARTMENT_OTHER): Payer: Commercial Managed Care - HMO | Admitting: Psychiatry

## 2022-02-06 VITALS — Wt 213.0 lb

## 2022-02-06 DIAGNOSIS — F419 Anxiety disorder, unspecified: Secondary | ICD-10-CM | POA: Diagnosis not present

## 2022-02-06 DIAGNOSIS — F3131 Bipolar disorder, current episode depressed, mild: Secondary | ICD-10-CM | POA: Diagnosis not present

## 2022-02-06 DIAGNOSIS — F900 Attention-deficit hyperactivity disorder, predominantly inattentive type: Secondary | ICD-10-CM | POA: Diagnosis not present

## 2022-02-06 MED ORDER — FLUOXETINE HCL 40 MG PO CAPS: 40.0000 mg | ORAL_CAPSULE | Freq: Every day | ORAL | 2 refills | Status: DC

## 2022-02-06 MED ORDER — ARIPIPRAZOLE 15 MG PO TABS: 15.0000 mg | ORAL_TABLET | Freq: Every day | ORAL | 2 refills | Status: DC

## 2022-02-06 MED ORDER — LISDEXAMFETAMINE DIMESYLATE 40 MG PO CAPS: 40.0000 mg | ORAL_CAPSULE | Freq: Every day | ORAL | 0 refills | Status: DC

## 2022-02-06 MED ORDER — LAMOTRIGINE 200 MG PO TABS: 200.0000 mg | ORAL_TABLET | Freq: Every day | ORAL | 2 refills | Status: DC

## 2022-02-06 NOTE — Progress Notes (Signed)
Virtual Visit via Telephone Note  I connected with Kristin Stephens on 02/06/22 at  2:00 PM EST by telephone and verified that I am speaking with the correct person using two identifiers.  Location: Patient: Work Provider: Biomedical scientist   I discussed the limitations, risks, security and privacy concerns of performing an evaluation and management service by telephone and the availability of in person appointments. I also discussed with the patient that there may be a patient responsible charge related to this service. The patient expressed understanding and agreed to proceed.   History of Present Illness: Patient is evaluated by phone session.  She reported increased stress lately because she has to move from her previous place to new place.  Patient reported landlord increased the rent she could not afford.  She also reported taking the exam for life and health insurance to get certification.  That is also causing her stress.  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  Her job is going well.  She is able to focus, attentive.  Sometimes she struggle with multitasking which could be due to increased anxiety lately.  She also reported daylight savings did not help and she does not like.  She did bought some extra light bulbs to help.  Currently level is good.  She has not taken trazodone in a while but does take the hydroxyzine as needed.  She is compliant with Lamictal, Abilify and Vyvanse which she usually takes Monday to Friday and occasionally on Saturday.  Relationship is going well.  She is in touch with her daughter who is going to be 73 years old in January.  Patient denies any mania, psychosis, hallucination.  She denies any anger or any suicidal thoughts.  Her appetite is okay.  Her weight is stable.  She is working in person for [employer].  She likes her job.   Past Psychiatric History:  H/O mania, depression,  anger, overdose and slashing her wrist.  Did IOP in 2016. Inpatient in October 2018. H/O ETOH. Took Adderall, Ativan, Gabapentin, Wellbutrin but limited response.      Psychiatric Specialty Exam: Physical Exam  Review of Systems  Weight 213 lb (96.6 kg).There is no height or weight on file to calculate BMI.  General Appearance: NA  Eye Contact:  NA  Speech:  Clear and Coherent and Normal Rate  Volume:  Normal  Mood:  Anxious  Affect:  NA  Thought Process:  Goal Directed  Orientation:  Full (Time, Place, and Person)  Thought Content:  Rumination  Suicidal Thoughts:  No  Homicidal Thoughts:  No  Memory:  Immediate;   Good Recent;   Good Remote;   Good  Judgement:  Good  Insight:  Present  Psychomotor Activity:  NA  Concentration:  Concentration: Fair and Attention Span: Fair  Recall:  Good  Fund of Knowledge:  Good  Language:  Good  Akathisia:  No  Handed:  Right  AIMS (if indicated):     Assets:  Communication Skills Desire for Improvement Housing Social Support Transportation  ADL's:  Intact  Cognition:  WNL  Sleep:   ok       Assessment and Plan: Bipolar disorder type I.  Attention deficit disorder, inattentive type.  Anxiety  Discuss situational stress related to daylight saving, moving and taking exam.  Recommend to take the hydroxyzine if she feels very anxious.  She is prescribed and sometimes she takes  for sleep and anxiety.  Recommended to call us back if she has any question or any concern.  Continue Vyvanse 40 mg to take Monday to Friday and occasionally Saturday, Abilify 15 mg daily, Lamictal 200 mg daily and Prozac 40 mg daily.  I also encouraged to have physical for blood work since patient is taking Abilify that requires monitoring of blood sugar.  Recommend to contact her PCP as patient recently started establish care with Dr. Burnard Bunting at [facility].  Patient promised to have it done hemoglobin A1c with them soon.  Recommended to call  us back if she has any question or any concern.  Follow-up in 3 months.  Follow Up Instructions:    I discussed the assessment and treatment plan with the patient. The patient was provided an opportunity to ask questions and all were answered. The patient agreed with the plan and demonstrated an understanding of the instructions.   The patient was advised to call back or seek an in-person evaluation if the symptoms worsen or if the condition fails to improve as anticipated.  Collaboration of Care: Other provider involved in patient's care AEB notes are available in epic to review.  Patient/Guardian was advised Release of Information must be obtained prior to any record release in order to collaborate their care with an outside provider. Patient/Guardian was advised if they have not already done so to contact the registration department to sign all necessary forms in order for [place] to release information regarding their care.   Consent: Patient/Guardian gives verbal consent for treatment and assignment of benefits for services provided during this visit. Patient/Guardian expressed understanding and agreed to proceed.    I provided 18 minutes of non-face-to-face time during this encounter.   Kathlee Nations, MD

## 2022-02-10 ENCOUNTER — Other Ambulatory Visit (HOSPITAL_COMMUNITY): Payer: Self-pay | Admitting: Psychiatry

## 2022-02-10 DIAGNOSIS — F3131 Bipolar disorder, current episode depressed, mild: Secondary | ICD-10-CM

## 2022-02-11 ENCOUNTER — Ambulatory Visit: Payer: Commercial Managed Care - HMO

## 2022-02-12 ENCOUNTER — Ambulatory Visit (INDEPENDENT_AMBULATORY_CARE_PROVIDER_SITE_OTHER): Payer: Commercial Managed Care - HMO

## 2022-02-12 DIAGNOSIS — Z3042 Encounter for surveillance of injectable contraceptive: Secondary | ICD-10-CM

## 2022-02-12 MED ORDER — MEDROXYPROGESTERONE ACETATE 150 MG/ML IM SUSP
150.0000 mg | Freq: Once | INTRAMUSCULAR | Status: AC
  Administered 2022-02-12: 150 mg via INTRAMUSCULAR

## 2022-03-03 NOTE — Progress Notes (Signed)
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 female with a history of ASD/VSD/Coarctation.  She is s/p repair in 1989.  Also hx of HTN, palpitaitons and biplar disorder  She has been followed with echoes and MRIs of her heart I last saw her in Aug 2022  Since seen she has done OK from a cardiac standpoint   Breathing is OK  No dizziness  No palpitations   No CP       She is working on diet   Still drinking Mtn Dew, cutting back   Has noticed her weight has gone down     Outpatient Medications Prior to Visit  Medication Sig Dispense Refill   acetaminophen (TYLENOL) 500 MG tablet Take 1,000-2,000 mg by mouth every 6 (six) hours as needed for mild pain, moderate pain, fever or headache.     ALPRAZolam (XANAX) 0.25 MG tablet Take one tablet (0.25 mg) by mouth 30-40 minutes prior to your Cardiac MRI. 1 tablet 0   ARIPiprazole (ABILIFY) 15 MG tablet Take 1 tablet (15 mg total) by mouth daily. 30 tablet 2   aspirin 81 MG chewable tablet Chew by mouth daily.     diclofenac Sodium (VOLTAREN) 1 % GEL Apply topically 4 (four) times daily.     FLUoxetine (PROZAC) 40 MG capsule Take 1 capsule (40 mg total) by mouth daily. 30 capsule 2   fluticasone (FLONASE) 50 MCG/ACT nasal spray USE 2 SPRAYS IN EACH NOSTRIL EVERY DAY (Patient taking differently: Place 2 sprays into both nostrils daily.) 16 g 5   hydrOXYzine (ATARAX) 25 MG tablet Take 1 tablet by mouth daily as needed.     ibuprofen (ADVIL) 200 MG tablet Take 200 mg by mouth every 6 (six) hours as needed for mild pain or headache.     lamoTRIgine (LAMICTAL) 200 MG tablet Take 1 tablet (200 mg total) by mouth at bedtime. 30 tablet 2   lisdexamfetamine (VYVANSE) 40 MG capsule Take 1 capsule (40 mg total) by mouth  daily. 30 capsule 0   loratadine (CLARITIN) 10 MG tablet Take 1 tablet by mouth daily as needed for allergies.     medroxyPROGESTERone (DEPO-PROVERA) 150 MG/ML injection Inject 150 mg into the muscle every 3 (three) months.     metoprolol succinate (TOPROL-XL) 25 MG 24 hr tablet TAKE ONE AND A HALF TABLETS BY MOUTH EVERY DAY 135 tablet 3   No facility-administered medications prior to visit.     Allergies:   Sulfonamide derivatives, Coconut (cocos nucifera), and Coconut flavor   Past Medical History:  Diagnosis Date   Abnormal Pap smear of cervix    age 10 with hx of cryotherapy   ADHD (attention deficit hyperactivity disorder)    Anxiety    Asthma    daily and prn inhalers   Bipolar affective disorder (HCC)    COVID 05/2020   Dental crowns present    Depression    Deviated nasal septum 05/2012   Eczema    right leg   Esophageal reflux    Fibromyalgia    Heart murmur    Hypertension    IBS (irritable bowel syndrome)    Migraine headache    with aura   Nasal turbinate  hypertrophy 05/2012   bilateral   OCD (obsessive compulsive disorder)    STD (sexually transmitted disease)    Hx of abnormal pap--age 65    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     Past Surgical History:  Procedure Laterality Date   ANKLE RECONSTRUCTION Left 08/14/2021   Procedure: RECONSTRUCTION ANKLE Open debridement of left peroneal tendons;  Surgeon: Netta Cedars, MD;  Location: [facility];  Service: Orthopedics;  Laterality: Left;   ASD AND VSD REPAIR  1989   CARDIAC CATHETERIZATION  06/18/2006   CESAREAN SECTION  04/12/2009   COARCTATION OF AORTA REPAIR  1989   COLONOSCOPY N/A 05/20/2013   Procedure: COLONOSCOPY;  Surgeon: West Bali, MD;  [facility];  Service: Endoscopy;  Laterality: N/A;  115-moved to 1230 Leigh Ann notified pt   ESOPHAGOGASTRODUODENOSCOPY  04/04/2008     Normal esophagus/Mild patchy erythema in the antrum/ Normal  duodenal bulb, normal small bowel biopsy   FLEXIBLE SIGMOIDOSCOPY  04/04/2008     Small internal hemorrhoids (poor bowel prep)   HEMORRHOID BANDING N/A 05/20/2013   Procedure: HEMORRHOID BANDING;  Surgeon: West Bali, MD;  [facility]: [facility];  Service: Endoscopy;  Laterality: N/A;   NASAL SEPTOPLASTY W/ TURBINOPLASTY Bilateral 05/31/2012   Procedure: NASAL SEPTOPLASTY WITH TURBINATE REDUCTION;  Surgeon: Darletta Moll, MD;  Location:  SURGERY CENTER;  Service: ENT;  Laterality: Bilateral;     Social History:  The patient  reports that she has been smoking cigarettes. She started smoking about 20 years ago. She has a 5.00 pack-year smoking history. She has never used smokeless tobacco. She reports that she does not drink alcohol and does not use drugs.   Family History:  The patient's family history includes ADD / ADHD in her brother and sister; Alcohol abuse in her father; Anxiety disorder in her brother; Bipolar disorder in her father and paternal grandmother; Cancer in her mother; Dementia in her maternal grandmother and paternal grandmother; Depression in her mother; Emphysema in her paternal grandmother; Heart disease in her maternal grandmother and paternal grandmother; Hyperlipidemia in her mother; Hypertension in her mother; Personality disorder in her father; Physical abuse in her mother; Prostate cancer in her maternal grandfather; Seizures in her daughter; Sexual abuse in her mother; Sleep apnea in her mother; Stroke in her maternal grandfather and mother; Thyroid disease in her maternal grandfather.    ROS:  Please see the history of present illness. All other systems are reviewed and  Negative to the above problem except as noted.    PHYSICAL EXAM: VS:  There were no vitals taken for this visit.  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   CONTRAST:  9.5 mL Gadopiclenol [facility])   COMPARISON:  October 2017   FINDINGS: Cardiovascular: Preferential opacification of the thoracic aorta. Postsurgical changes of aortic coarctation repair without complicating feature. The ascending thoracic aorta measures 3.5 cm, within normal limits. Similar measurements of the aortic root measuring up to 3.9 cm, within expected limits. Normal heart size. No pericardial effusion.   Mediastinum/Nodes: No enlarged mediastinal, hilar, or axillary lymph nodes. The thyroid gland appears normal.   Lungs/Pleura: No pleural effusion. No mass or lobar consolidation.   Musculoskeletal: No aggressive osseous lesions.   Upper abdomen: Stable appearance of previously described focal nodular hyperplasia (FNH) in the right hepatic dome.   IMPRESSION: 1. Stable postsurgical changes of aortic coarctation repair without complicating feature. No findings of thoracic aortic aneurysm. Aortic measurements remain stable since 2017 as described.      Echo: 11/12/20  eft ventricular ejection fraction, by estimation, is 65 to 70%. The left ventricle has normal function. The left ventricle has no regional wall motion abnormalities. Left ventricular diastolic parameters were normal. 1. Right ventricular systolic function is normal. The right  ventricular size is normal. There is normal pulmonary artery systolic pressure. 2. 3. Right atrial size was mildly dilated. 4. The mitral valve is normal in structure. Trivial mitral valve regurgitation. Diffcult to see aortic leaflets well. . The aortic valve is tricuspid. Aortic valve regurgitation is not visualized. Mild aortic valve sclerosis is present, with no evidence of aortic valve stenosis. 5. Acoustic window is difficult for accurate measurements of aortic root s/p coarctation repair (1989) Peak gradient down descending aorta is 19 mm Hg (previous echo from 2021, peak gradient was 22 mm Hg).. Aortic dilatation noted. There is moderate dilatation of the aortic root, measuring 43 mm. 6. The inferior vena cava is normal in size with greater than 50% respiratory variability, suggesting right atrial pressure of 3 mmHg.    Lipid Panel    Component Value Date/Time   CHOL 175 12/30/2018 1007   TRIG 84 12/30/2018 1007   HDL 45 12/30/2018 1007   CHOLHDL 3.9 12/30/2018 1007   CHOLHDL 4.2 02/06/2007 0404   VLDL 14 02/06/2007 0404   LDLCALC 114 (H) 12/30/2018 1007      Wt Readings from Last 3 Encounters:  11/26/21 213 lb (96.6 kg)  08/14/21 215 lb 6.2 oz (97.7 kg)  01/04/21 222 lb (100.7 kg)      ASSESSMENT AND PLAN:  1  Congenital heart dz s/p ASD/ VSD/coarc repair (1989)   Pt is doing well      Recent MRI aorta is normal   Follow       2  Palpitations Again, usually at night  Will increase metoprolol to 25 XL bid  Follow     3   HTN   BP is minimally increased  BP was 130/82 on my check   Contnue to follow    Keep on current meds     4 Morbid obesity  Reviewed diet   Cut out SSB   Increase vegetables      5  HCM   Will check lipids       Follow up next fall       Current medicines are reviewed at length with the patient today.  The patient does not have concerns regarding medicines.  Signed, Dietrich Pates, MD  03/03/2022 10:25 PM    [facility] [address] [place], [place], [place]  [address] Phone: [phone]; Fax: [phone]

## 2022-03-04 ENCOUNTER — Ambulatory Visit: Payer: Commercial Managed Care - HMO | Attending: Internal Medicine | Admitting: Internal Medicine

## 2022-03-04 ENCOUNTER — Encounter: Payer: Self-pay | Admitting: Internal Medicine

## 2022-03-04 VITALS — BP 122/90 | HR 92 | Ht 65.25 in | Wt 208.0 lb

## 2022-03-04 DIAGNOSIS — Q249 Congenital malformation of heart, unspecified: Secondary | ICD-10-CM

## 2022-03-04 DIAGNOSIS — Q251 Coarctation of aorta: Secondary | ICD-10-CM

## 2022-03-04 MED ORDER — METOPROLOL SUCCINATE ER 25 MG PO TB24: ORAL_TABLET | ORAL | 3 refills | Status: DC

## 2022-03-04 NOTE — Patient Instructions (Addendum)
Medication Instructions:  INCREASE TOPROL FULL TAB TWICE A DAY  *If you need a refill on your cardiac medications before your next appointment, please call your pharmacy*   Lab Work:  If you have labs (blood work) drawn today and your tests are completely normal, you will receive your results only by: [place] (if you have MyChart) OR A paper copy in the mail If you have any lab test that is abnormal or we need to change your treatment, we will call you to review the results.   Testing/Procedures:    Follow-Up: At [facility], you and your health needs are our priority.  As part of our continuing mission to provide you with exceptional heart care, we have created designated Provider Care Teams.  These Care Teams include your primary Cardiologist (physician) and Advanced Practice Providers (APPs -  Physician Assistants and Nurse Practitioners) who all work together to provide you with the care you need, when you need it.  We recommend signing up for the patient portal called "MyChart".  Sign up information is provided on this After Visit Summary.  MyChart is used to connect with patients for Virtual Visits (Telemedicine).  Patients are able to view lab/test results, encounter notes, upcoming appointments, etc.  Non-urgent messages can be sent to your provider as well.   To learn more about what you can do with MyChart, go to [email or web address].    Your next appointment:   9 month(s)  The format for your next appointment:   In Person  DR PAULA ROSS    Other Instructions   Important Information About Sugar

## 2022-03-10 ENCOUNTER — Telehealth (HOSPITAL_COMMUNITY): Payer: Self-pay | Admitting: *Deleted

## 2022-03-10 DIAGNOSIS — F900 Attention-deficit hyperactivity disorder, predominantly inattentive type: Secondary | ICD-10-CM

## 2022-03-10 MED ORDER — LISDEXAMFETAMINE DIMESYLATE 40 MG PO CAPS: 40.0000 mg | ORAL_CAPSULE | Freq: Every day | ORAL | 0 refills | Status: DC

## 2022-03-10 NOTE — Telephone Encounter (Signed)
She is getting generic. Will do the same.

## 2022-03-10 NOTE — Telephone Encounter (Signed)
Pt called requesting brand name Vyvanse 40 mg be sent to [facility] on 03/13/22. Pt next appointment is scheduled for 05/08/22. Please review.

## 2022-04-07 ENCOUNTER — Telehealth (HOSPITAL_COMMUNITY): Payer: Self-pay

## 2022-04-07 DIAGNOSIS — F900 Attention-deficit hyperactivity disorder, predominantly inattentive type: Secondary | ICD-10-CM

## 2022-04-07 MED ORDER — LISDEXAMFETAMINE DIMESYLATE 40 MG PO CAPS: 40.0000 mg | ORAL_CAPSULE | Freq: Every day | ORAL | 0 refills | Status: DC

## 2022-04-07 NOTE — Telephone Encounter (Signed)
A new prescription sent to her pharmacy.  She can pick up on her due date.

## 2022-04-07 NOTE — Telephone Encounter (Signed)
Patient called to request a refill  Last visit 02/06/22 Next visit 05/08/22     Disp Refills Start End   lisdexamfetamine (VYVANSE) 40 MG capsule 30 capsule 0 03/10/2022    Sig - Route: Take 1 capsule (40 mg total) by mouth daily. - Oral   Sent to pharmacy as: lisdexamfetamine (VYVANSE) 40 MG capsule   Earliest Fill Date: 03/10/2022   Notes to Pharmacy: Do not refill until 03/13/22   E-Prescribing Status: Receipt confirmed by pharmacy (03/10/2022  4:21 PM EST)

## 2022-05-01 ENCOUNTER — Ambulatory Visit (INDEPENDENT_AMBULATORY_CARE_PROVIDER_SITE_OTHER): Payer: Commercial Managed Care - HMO

## 2022-05-01 DIAGNOSIS — Z3042 Encounter for surveillance of injectable contraceptive: Secondary | ICD-10-CM

## 2022-05-01 MED ORDER — MEDROXYPROGESTERONE ACETATE 150 MG/ML IM SUSP
150.0000 mg | Freq: Once | INTRAMUSCULAR | Status: AC
  Administered 2022-05-01: 150 mg via INTRAMUSCULAR

## 2022-05-01 NOTE — Progress Notes (Signed)
Depo Provera '150mg'$  given LUOQ.  Patient tolerated injection well.  Next injection is due 07/18/22-08/01/22.

## 2022-05-05 ENCOUNTER — Other Ambulatory Visit (HOSPITAL_COMMUNITY): Payer: Self-pay | Admitting: Psychiatry

## 2022-05-05 DIAGNOSIS — F3131 Bipolar disorder, current episode depressed, mild: Secondary | ICD-10-CM

## 2022-05-08 ENCOUNTER — Encounter (HOSPITAL_COMMUNITY): Payer: Self-pay | Admitting: Psychiatry

## 2022-05-08 ENCOUNTER — Telehealth (HOSPITAL_BASED_OUTPATIENT_CLINIC_OR_DEPARTMENT_OTHER): Payer: Commercial Managed Care - HMO | Admitting: Psychiatry

## 2022-05-08 VITALS — Wt 208.0 lb

## 2022-05-08 DIAGNOSIS — F419 Anxiety disorder, unspecified: Secondary | ICD-10-CM

## 2022-05-08 DIAGNOSIS — F900 Attention-deficit hyperactivity disorder, predominantly inattentive type: Secondary | ICD-10-CM | POA: Diagnosis not present

## 2022-05-08 DIAGNOSIS — F3131 Bipolar disorder, current episode depressed, mild: Secondary | ICD-10-CM

## 2022-05-08 MED ORDER — ARIPIPRAZOLE 15 MG PO TABS: 15.0000 mg | ORAL_TABLET | Freq: Every day | ORAL | 2 refills | Status: DC

## 2022-05-08 MED ORDER — FLUOXETINE HCL 40 MG PO CAPS: 40.0000 mg | ORAL_CAPSULE | Freq: Every day | ORAL | 2 refills | Status: DC

## 2022-05-08 MED ORDER — HYDROXYZINE HCL 25 MG PO TABS: 25.0000 mg | ORAL_TABLET | Freq: Every day | ORAL | 0 refills | Status: DC | PRN

## 2022-05-08 MED ORDER — LAMOTRIGINE 200 MG PO TABS: 200.0000 mg | ORAL_TABLET | Freq: Every day | ORAL | 2 refills | Status: DC

## 2022-05-08 MED ORDER — LISDEXAMFETAMINE DIMESYLATE 40 MG PO CAPS: 40.0000 mg | ORAL_CAPSULE | Freq: Every day | ORAL | 0 refills | Status: DC

## 2022-05-08 NOTE — Progress Notes (Signed)
Virtual Visit via Telephone Note  I connected with Kristin Stephens on 05/08/22 at  2:40 PM EST by telephone and verified that I am speaking with the correct person using two identifiers.  Location: Patient: Work Provider: Office   I discussed the limitations, risks, security and privacy concerns of performing an evaluation and management service by telephone and the availability of in person appointments. I also discussed with the patient that there may be a patient responsible charge related to this service. The patient expressed understanding and agreed to proceed.   History of Present Illness: Patient is a limited by phone session.  She reported lately has some anxiety and nervousness.  She recalled one incident when her boyfriend did not pick up the phone until later she find out that his phone battery was dead.  She was very anxious and nervous.  She took the hydroxyzine and that helped to calm her down.  Overall things are going well.  She denies any crying spells, feeling of hopelessness, mania, agitation.  She sleeps good.  She takes the Vyvanse most of the days when she worked.  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  She reported things going very well with the boyfriend.  She denies any suicidal thoughts or homicidal thoughts.  She is trying to lose weight and she had lost 6 pounds in 2 months as she stopped drinking soda.  Her energy level is improved.  She has no tremor or shakes or any EPS.   Past Psychiatric History:  H/O mania, depression, anger, overdose and slashing her wrist.  Did IOP in 2016. Inpatient in October 2018. H/O ETOH. Took Adderall, Ativan, Gabapentin, Wellbutrin but limited response.       Psychiatric Specialty Exam: Physical Exam  Review of Systems  Weight 208 lb (94.3 kg).There is no height or weight on file to calculate BMI.  General Appearance: NA  Eye Contact:  NA  Speech:  Clear and Coherent   Volume:  Normal  Mood:  Anxious  Affect:  NA  Thought Process:  Goal Directed  Orientation:  Full (Time, Place, and Person)  Thought Content:  Rumination  Suicidal Thoughts:  No  Homicidal Thoughts:  No  Memory:  Immediate;   Good Recent;   Good Remote;   Good  Judgement:  Intact  Insight:  Present  Psychomotor Activity:  NA  Concentration:  Concentration: Fair and Attention Span: Fair  Recall:  Good  Fund of Knowledge:  Good  Language:  Good  Akathisia:  No  Handed:  Right  AIMS (if indicated):     Assets:  Communication Skills Desire for Improvement Housing Social Support  ADL's:  Intact  Cognition:  WNL  Sleep:         Assessment and Plan: Bipolar disorder type I.  Attention deficit disorder, inattentive type.  Anxiety.  Reassurance given.  Patient like to have hydroxyzine that helps him anxiety when she needed.  I agree that we will provide 20 pills of hydroxyzine that she can take for anxiety.  Continue Vyvanse 40 mg which she takes Monday to Friday when she works, Abilify 15 mg daily, Lamictal 200 mg daily, Prozac 40 mg daily.  Recommend to call us back if she has any question or any concern.  Follow-up in 3 months.  Follow Up Instructions:    I discussed the assessment and treatment plan with the patient. The patient was provided an opportunity  to ask questions and all were answered. The patient agreed with the plan and demonstrated an understanding of the instructions.   The patient was advised to call back or seek an in-person evaluation if the symptoms worsen or if the condition fails to improve as anticipated.  Collaboration of Care: Other provider involved in patient's care AEB notes are available in epic to review.  Patient/Guardian was advised Release of Information must be obtained prior to any record release in order to collaborate their care with an outside provider. Patient/Guardian was advised if they have not already done so to contact the  registration department to sign all necessary forms in order for [place] to release information regarding their care.   Consent: Patient/Guardian gives verbal consent for treatment and assignment of benefits for services provided during this visit. Patient/Guardian expressed understanding and agreed to proceed.    I provided 13 minutes of non-face-to-face time during this encounter.   Kathlee Nations, MD

## 2022-06-04 ENCOUNTER — Other Ambulatory Visit (HOSPITAL_COMMUNITY): Payer: Self-pay | Admitting: Psychiatry

## 2022-06-04 DIAGNOSIS — F3131 Bipolar disorder, current episode depressed, mild: Secondary | ICD-10-CM

## 2022-06-05 ENCOUNTER — Telehealth (HOSPITAL_COMMUNITY): Payer: Self-pay | Admitting: *Deleted

## 2022-06-05 NOTE — Telephone Encounter (Signed)
PA FOR VYVANSE 40 MG SUBMITTED TO [facility] VIA [employer]OVER[employer].  AWAITING DETERMINATION.  COVERMYMEDS KEY: [id number]

## 2022-06-10 ENCOUNTER — Telehealth (HOSPITAL_COMMUNITY): Payer: Self-pay | Admitting: *Deleted

## 2022-06-10 DIAGNOSIS — F900 Attention-deficit hyperactivity disorder, predominantly inattentive type: Secondary | ICD-10-CM

## 2022-06-10 MED ORDER — LISDEXAMFETAMINE DIMESYLATE 40 MG PO CAPS: 40.0000 mg | ORAL_CAPSULE | Freq: Every day | ORAL | 0 refills | Status: DC

## 2022-06-10 NOTE — Telephone Encounter (Signed)
Done

## 2022-06-10 NOTE — Telephone Encounter (Signed)
Pt called requesting a refill of Vyvanse 40 mg capsules which is due for refill on 06/12/22. Please send to [facility]. Pt next scheduled appointment 08/07/22. Please review.

## 2022-06-12 ENCOUNTER — Telehealth (HOSPITAL_COMMUNITY): Payer: Self-pay | Admitting: *Deleted

## 2022-06-12 DIAGNOSIS — F900 Attention-deficit hyperactivity disorder, predominantly inattentive type: Secondary | ICD-10-CM

## 2022-06-12 MED ORDER — LISDEXAMFETAMINE DIMESYLATE 40 MG PO CAPS: 40.0000 mg | ORAL_CAPSULE | Freq: Every day | ORAL | 0 refills | Status: DC

## 2022-06-12 NOTE — Telephone Encounter (Signed)
I confirmed with pharmacy.

## 2022-06-12 NOTE — Telephone Encounter (Signed)
Pt called to advise that she has been unable to fill script for Vyvanse 40 mg name brand as too expensive and generic not available. Pt did say that she found generic Vyvanse 50 mg @ [facility] and will call them back to verify they still have it in stock. Pt will notify if not available. Pt also discussed alternative medication however says she really doesn't want to try anything else as the Vyvanse work very well for her. Pt next scheduled appointment is on 08/07/22. Please review and advise.

## 2022-06-12 NOTE — Telephone Encounter (Signed)
No doses increase.  She need to find a pharmacy where she can get 40 mg of Vyvanse.

## 2022-06-12 NOTE — Telephone Encounter (Signed)
Pt did call [facility] per nurses advice and did find the Vyvanse 40 mg caps. Says they enough for a 30 day fill. Thanks.

## 2022-06-12 NOTE — Telephone Encounter (Signed)
Done

## 2022-06-12 NOTE — Telephone Encounter (Signed)
Pt has a list of pharmacies she's tried. Pt says that [facility] in [place] has the 20 mg generic. Pt wants t know if you are ok with that? Please advise.

## 2022-06-12 NOTE — Telephone Encounter (Signed)
Please confirm if [facility] pharmacy has generic Vyvanse so I can send the prescription.

## 2022-06-12 NOTE — Telephone Encounter (Signed)
[facility] has only the Vyvanse 50 mg in stock. Pt is requesting that dose. Please review.

## 2022-06-13 ENCOUNTER — Other Ambulatory Visit (HOSPITAL_COMMUNITY): Payer: Self-pay

## 2022-06-13 MED ORDER — LISDEXAMFETAMINE DIMESYLATE 40 MG PO CAPS
40.0000 mg | ORAL_CAPSULE | Freq: Every day | ORAL | 0 refills | Status: DC
  Filled 2022-06-13: qty 30, 30d supply, fill #0

## 2022-06-13 NOTE — Telephone Encounter (Signed)
I send to visit to Kristin Stephens.  If they do not have it then she need to try Strattera.

## 2022-07-08 ENCOUNTER — Telehealth (HOSPITAL_COMMUNITY): Payer: Self-pay | Admitting: *Deleted

## 2022-07-08 NOTE — Telephone Encounter (Signed)
Pt called to inquire whether the last script for Lisdexamfetamine 40 mg is still valid or would another script need to be sent to pharmacy. Writer advised that the last Rx was sent on 06/13/22 and doesn't expire until 12/10/22. Pt advised to call pharmacy to verify. Pt verbalizes understanding and will call back with any questions or concerns.

## 2022-07-10 ENCOUNTER — Telehealth (HOSPITAL_COMMUNITY): Payer: Self-pay

## 2022-07-10 ENCOUNTER — Other Ambulatory Visit (HOSPITAL_COMMUNITY): Payer: Self-pay

## 2022-07-10 NOTE — Telephone Encounter (Signed)
Medication management - Prior authorization for coverage of patient's prescribed Lisdexamfetamine 40 mg capsules, initiated online with CoverMyMeds and pending approval decision.

## 2022-07-14 ENCOUNTER — Telehealth (HOSPITAL_COMMUNITY): Payer: Self-pay | Admitting: *Deleted

## 2022-07-14 ENCOUNTER — Telehealth (HOSPITAL_COMMUNITY): Payer: Self-pay

## 2022-07-14 NOTE — Telephone Encounter (Signed)
Medication management - Additional information submitted online with CoverMyMeds to assist patient with prior authorization for her currently prescribed Vyvanse 40 mg. Decision pending review.

## 2022-07-14 NOTE — Telephone Encounter (Signed)
Jillian from [employer] called to inform that P>A> is still pending 07/10/22 for lisdexamfetamine (VYVANSE) 40 MG capsule  Case id # [id number] Phone # [phone] for Verbal Response

## 2022-07-18 ENCOUNTER — Ambulatory Visit: Payer: Commercial Managed Care - HMO

## 2022-07-29 ENCOUNTER — Ambulatory Visit (INDEPENDENT_AMBULATORY_CARE_PROVIDER_SITE_OTHER): Payer: Commercial Managed Care - HMO

## 2022-07-29 DIAGNOSIS — Z3042 Encounter for surveillance of injectable contraceptive: Secondary | ICD-10-CM | POA: Diagnosis not present

## 2022-07-29 MED ORDER — MEDROXYPROGESTERONE ACETATE 150 MG/ML IM SUSY
150.0000 mg | PREFILLED_SYRINGE | Freq: Once | INTRAMUSCULAR | Status: AC
  Administered 2022-07-29: 150 mg via INTRAMUSCULAR

## 2022-07-29 NOTE — Progress Notes (Signed)
Depo Provera 150mg  given LUOQ.  Patient tolerated injection well. Next due August 15-29

## 2022-08-05 ENCOUNTER — Other Ambulatory Visit (HOSPITAL_COMMUNITY): Payer: Self-pay | Admitting: *Deleted

## 2022-08-05 DIAGNOSIS — F419 Anxiety disorder, unspecified: Secondary | ICD-10-CM

## 2022-08-05 DIAGNOSIS — F3131 Bipolar disorder, current episode depressed, mild: Secondary | ICD-10-CM

## 2022-08-05 MED ORDER — HYDROXYZINE HCL 25 MG PO TABS: 25.0000 mg | ORAL_TABLET | Freq: Every day | ORAL | 0 refills | Status: DC | PRN

## 2022-08-07 ENCOUNTER — Ambulatory Visit (HOSPITAL_COMMUNITY): Payer: Commercial Managed Care - HMO | Admitting: Psychiatry

## 2022-08-08 ENCOUNTER — Telehealth (HOSPITAL_COMMUNITY): Payer: Self-pay | Admitting: *Deleted

## 2022-08-08 DIAGNOSIS — F900 Attention-deficit hyperactivity disorder, predominantly inattentive type: Secondary | ICD-10-CM

## 2022-08-08 NOTE — Telephone Encounter (Signed)
Pt called to advise that [facility] has Vyvanse 40 mg in stock. Pt requesting refill on due date. Pt ha sf/u scheduled on 08/28/22. Please review.

## 2022-08-11 MED ORDER — LISDEXAMFETAMINE DIMESYLATE 40 MG PO CAPS
40.0000 mg | ORAL_CAPSULE | Freq: Every day | ORAL | 0 refills | Status: DC
Start: 2022-08-11 — End: 2022-08-28

## 2022-08-11 NOTE — Telephone Encounter (Signed)
Done

## 2022-08-28 ENCOUNTER — Encounter (HOSPITAL_COMMUNITY): Payer: Self-pay | Admitting: Psychiatry

## 2022-08-28 ENCOUNTER — Ambulatory Visit (HOSPITAL_BASED_OUTPATIENT_CLINIC_OR_DEPARTMENT_OTHER): Payer: Commercial Managed Care - HMO | Admitting: Psychiatry

## 2022-08-28 VITALS — BP 124/83 | HR 105 | Ht 65.0 in | Wt 203.0 lb

## 2022-08-28 DIAGNOSIS — F419 Anxiety disorder, unspecified: Secondary | ICD-10-CM

## 2022-08-28 DIAGNOSIS — F3131 Bipolar disorder, current episode depressed, mild: Secondary | ICD-10-CM | POA: Diagnosis not present

## 2022-08-28 DIAGNOSIS — F900 Attention-deficit hyperactivity disorder, predominantly inattentive type: Secondary | ICD-10-CM | POA: Diagnosis not present

## 2022-08-28 MED ORDER — ARIPIPRAZOLE 20 MG PO TABS: 20.0000 mg | ORAL_TABLET | Freq: Every day | ORAL | 2 refills | Status: DC

## 2022-08-28 MED ORDER — LAMOTRIGINE 200 MG PO TABS
200.0000 mg | ORAL_TABLET | Freq: Every day | ORAL | 2 refills | Status: DC
Start: 2022-08-28 — End: 2022-12-05

## 2022-08-28 MED ORDER — HYDROXYZINE HCL 25 MG PO TABS
25.0000 mg | ORAL_TABLET | Freq: Every day | ORAL | 0 refills | Status: DC | PRN
Start: 2022-08-28 — End: 2022-11-25

## 2022-08-28 MED ORDER — LISDEXAMFETAMINE DIMESYLATE 40 MG PO CAPS
40.0000 mg | ORAL_CAPSULE | Freq: Every day | ORAL | 0 refills | Status: DC
Start: 2022-08-28 — End: 2022-09-15

## 2022-08-28 MED ORDER — FLUOXETINE HCL 40 MG PO CAPS
40.0000 mg | ORAL_CAPSULE | Freq: Every day | ORAL | 2 refills | Status: DC
Start: 2022-08-28 — End: 2022-12-05
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
## 2022-09-15 ENCOUNTER — Telehealth (HOSPITAL_COMMUNITY): Payer: Self-pay

## 2022-09-15 ENCOUNTER — Other Ambulatory Visit (HOSPITAL_COMMUNITY): Payer: Self-pay

## 2022-09-15 DIAGNOSIS — F900 Attention-deficit hyperactivity disorder, predominantly inattentive type: Secondary | ICD-10-CM

## 2022-09-15 MED ORDER — LISDEXAMFETAMINE DIMESYLATE 40 MG PO CAPS
40.0000 mg | ORAL_CAPSULE | Freq: Every day | ORAL | 0 refills | Status: DC
Start: 2022-09-15 — End: 2022-11-11
  Filled 2022-09-15 (×2): qty 30, 30d supply, fill #0

## 2022-09-15 NOTE — Telephone Encounter (Signed)
Patients Vyvance is currently at [facility] but they do not currently have any in stock. She has called around and [facility] has some, she would like you to please send the script to [facility] and cancel the one at [facility]. Please review and advise, thank you

## 2022-09-15 NOTE — Telephone Encounter (Signed)
Done

## 2022-10-14 ENCOUNTER — Ambulatory Visit: Payer: Commercial Managed Care - HMO

## 2022-10-15 ENCOUNTER — Other Ambulatory Visit (HOSPITAL_COMMUNITY): Payer: Self-pay | Admitting: Psychiatry

## 2022-10-15 DIAGNOSIS — F3131 Bipolar disorder, current episode depressed, mild: Secondary | ICD-10-CM

## 2022-10-17 ENCOUNTER — Ambulatory Visit (INDEPENDENT_AMBULATORY_CARE_PROVIDER_SITE_OTHER): Payer: Commercial Managed Care - HMO

## 2022-10-17 DIAGNOSIS — Z3042 Encounter for surveillance of injectable contraceptive: Secondary | ICD-10-CM | POA: Diagnosis not present

## 2022-10-17 MED ORDER — MEDROXYPROGESTERONE ACETATE 150 MG/ML IM SUSY
150.0000 mg | PREFILLED_SYRINGE | Freq: Once | INTRAMUSCULAR | Status: AC
Start: 2022-10-17 — End: 2022-10-17
  Administered 2022-10-17: 150 mg via INTRAMUSCULAR

## 2022-11-05 ENCOUNTER — Other Ambulatory Visit (HOSPITAL_COMMUNITY): Payer: Self-pay | Admitting: Psychiatry

## 2022-11-05 DIAGNOSIS — F3131 Bipolar disorder, current episode depressed, mild: Secondary | ICD-10-CM

## 2022-11-05 DIAGNOSIS — F900 Attention-deficit hyperactivity disorder, predominantly inattentive type: Secondary | ICD-10-CM

## 2022-11-11 ENCOUNTER — Telehealth (HOSPITAL_COMMUNITY): Payer: Self-pay | Admitting: *Deleted

## 2022-11-11 NOTE — Telephone Encounter (Signed)
Vyvase refill sent as covering doctor for Dr. Lolly Mustache

## 2022-11-11 NOTE — Telephone Encounter (Signed)
Pt of Dr. Sheela Stack called requesting a refill of the Vyvanse 40 mg caps. Pt last appointment was on 07/3022 ans has f/u scheduled for 12/04/22. Last Rx sent on 09/15/22 to [facility]. Pt requests this script be sent to [facility]. Please review. Thank you.

## 2022-11-12 ENCOUNTER — Other Ambulatory Visit (HOSPITAL_COMMUNITY): Payer: Self-pay | Admitting: *Deleted

## 2022-11-12 DIAGNOSIS — F3131 Bipolar disorder, current episode depressed, mild: Secondary | ICD-10-CM

## 2022-11-12 MED ORDER — ARIPIPRAZOLE 20 MG PO TABS: 20.0000 mg | ORAL_TABLET | Freq: Every day | ORAL | 0 refills | Status: DC

## 2022-11-25 ENCOUNTER — Other Ambulatory Visit (HOSPITAL_COMMUNITY): Payer: Self-pay

## 2022-11-25 DIAGNOSIS — F419 Anxiety disorder, unspecified: Secondary | ICD-10-CM

## 2022-11-25 DIAGNOSIS — F3131 Bipolar disorder, current episode depressed, mild: Secondary | ICD-10-CM

## 2022-11-25 MED ORDER — HYDROXYZINE HCL 25 MG PO TABS: 25.0000 mg | ORAL_TABLET | Freq: Every day | ORAL | 0 refills | Status: DC | PRN

## 2022-12-02 ENCOUNTER — Telehealth (HOSPITAL_COMMUNITY): Payer: Commercial Managed Care - HMO | Admitting: Psychiatry

## 2022-12-03 ENCOUNTER — Emergency Department (HOSPITAL_BASED_OUTPATIENT_CLINIC_OR_DEPARTMENT_OTHER)
Admission: EM | Admit: 2022-12-03 | Discharge: 2022-12-03 | Disposition: A | Discharge status: Home / Self Care | Payer: Commercial Managed Care - HMO | Attending: Emergency Medicine | Admitting: Emergency Medicine

## 2022-12-03 ENCOUNTER — Encounter (HOSPITAL_BASED_OUTPATIENT_CLINIC_OR_DEPARTMENT_OTHER): Payer: Self-pay

## 2022-12-03 ENCOUNTER — Other Ambulatory Visit: Payer: Self-pay

## 2022-12-03 DIAGNOSIS — R112 Nausea with vomiting, unspecified: Secondary | ICD-10-CM | POA: Diagnosis present

## 2022-12-03 DIAGNOSIS — R197 Diarrhea, unspecified: Secondary | ICD-10-CM | POA: Diagnosis not present

## 2022-12-03 DIAGNOSIS — Z7951 Long term (current) use of inhaled steroids: Secondary | ICD-10-CM | POA: Diagnosis not present

## 2022-12-03 DIAGNOSIS — J45909 Unspecified asthma, uncomplicated: Secondary | ICD-10-CM | POA: Diagnosis not present

## 2022-12-03 DIAGNOSIS — R1084 Generalized abdominal pain: Secondary | ICD-10-CM | POA: Insufficient documentation

## 2022-12-03 DIAGNOSIS — I10 Essential (primary) hypertension: Secondary | ICD-10-CM | POA: Diagnosis not present

## 2022-12-03 DIAGNOSIS — Z79899 Other long term (current) drug therapy: Secondary | ICD-10-CM | POA: Diagnosis not present

## 2022-12-03 DIAGNOSIS — Z20822 Contact with and (suspected) exposure to covid-19: Secondary | ICD-10-CM | POA: Diagnosis not present

## 2022-12-03 LAB — URINALYSIS, ROUTINE W REFLEX MICROSCOPIC
Bilirubin Urine: NEGATIVE
Glucose, UA: NEGATIVE mg/dL
Hgb urine dipstick: NEGATIVE
Ketones, ur: 40 mg/dL — AB
Leukocytes,Ua: NEGATIVE
Nitrite: NEGATIVE
Specific Gravity, Urine: 1.027 (ref 1.005–1.030)
pH: 8.5 — ABNORMAL HIGH (ref 5.0–8.0)

## 2022-12-03 LAB — COMPREHENSIVE METABOLIC PANEL
ALT: 17 U/L (ref 0–44)
AST: 17 U/L (ref 15–41)
Albumin: 4.9 g/dL (ref 3.5–5.0)
Alkaline Phosphatase: 72 U/L (ref 38–126)
Anion gap: 13 (ref 5–15)
BUN: 6 mg/dL (ref 6–20)
CO2: 25 mmol/L (ref 22–32)
Calcium: 9.8 mg/dL (ref 8.9–10.3)
Chloride: 103 mmol/L (ref 98–111)
Creatinine, Ser: 0.72 mg/dL (ref 0.44–1.00)
GFR, Estimated: >60 mL/min (ref >60)
Glucose, Bld: 118 mg/dL — ABNORMAL HIGH (ref 70–99)
Potassium: 2.9 mmol/L — ABNORMAL LOW (ref 3.5–5.1)
Sodium: 141 mmol/L (ref 135–145)
Total Bilirubin: 0.7 mg/dL (ref 0.3–1.2)
Total Protein: 7.7 g/dL (ref 6.5–8.1)

## 2022-12-03 LAB — MAGNESIUM: Magnesium: 1.8 mg/dL (ref 1.7–2.4)

## 2022-12-03 LAB — RESP PANEL BY RT-PCR (RSV, FLU A&B, COVID)  RVPGX2
Influenza A by PCR: NEGATIVE
Influenza B by PCR: NEGATIVE
Resp Syncytial Virus by PCR: NEGATIVE
SARS Coronavirus 2 by RT PCR: NEGATIVE

## 2022-12-03 LAB — CBC
HCT: 40.4 % (ref 36.0–46.0)
Hemoglobin: 14.1 g/dL (ref 12.0–15.0)
MCH: 30 pg (ref 26.0–34.0)
MCHC: 34.9 g/dL (ref 30.0–36.0)
MCV: 86 fL (ref 80.0–100.0)
Platelets: 376 10*3/uL (ref 150–400)
RBC: 4.7 MIL/uL (ref 3.87–5.11)
RDW: 13.1 % (ref 11.5–15.5)
WBC: 12.4 10*3/uL — ABNORMAL HIGH (ref 4.0–10.5)
nRBC: 0 % (ref 0.0–0.2)

## 2022-12-03 LAB — PREGNANCY, URINE: Preg Test, Ur: NEGATIVE

## 2022-12-03 LAB — LIPASE, BLOOD: Lipase: 21 U/L (ref 11–51)

## 2022-12-03 MED ORDER — SODIUM CHLORIDE 0.9 % IV BOLUS
1000.0000 mL | Freq: Once | INTRAVENOUS | Status: AC
  Administered 2022-12-03: 1000 mL via INTRAVENOUS

## 2022-12-03 MED ORDER — ONDANSETRON HCL 4 MG/2ML IJ SOLN
4.0000 mg | Freq: Once | INTRAMUSCULAR | Status: AC
  Administered 2022-12-03: 4 mg via INTRAVENOUS
  Filled 2022-12-03: qty 2

## 2022-12-03 MED ORDER — ALUM & MAG HYDROXIDE-SIMETH 200-200-20 MG/5ML PO SUSP
30.0000 mL | Freq: Once | ORAL | Status: AC
  Administered 2022-12-03: 30 mL via ORAL
  Filled 2022-12-03: qty 30

## 2022-12-03 MED ORDER — HYOSCYAMINE SULFATE 0.125 MG SL SUBL
0.2500 mg | SUBLINGUAL_TABLET | Freq: Once | SUBLINGUAL | Status: AC
  Administered 2022-12-03: 0.25 mg via SUBLINGUAL
  Filled 2022-12-03: qty 2

## 2022-12-03 MED ORDER — PROMETHAZINE HCL 25 MG/ML IJ SOLN
INTRAMUSCULAR | Status: AC
  Filled 2022-12-03: qty 1

## 2022-12-03 MED ORDER — PANTOPRAZOLE SODIUM 40 MG PO TBEC: 40.0000 mg | DELAYED_RELEASE_TABLET | Freq: Every day | ORAL | 0 refills | Status: DC

## 2022-12-03 MED ORDER — POTASSIUM CHLORIDE CRYS ER 20 MEQ PO TBCR
40.0000 meq | EXTENDED_RELEASE_TABLET | Freq: Once | ORAL | Status: AC
  Administered 2022-12-03: 40 meq via ORAL
  Filled 2022-12-03: qty 2

## 2022-12-03 MED ORDER — PROMETHAZINE HCL 25 MG RE SUPP: 25.0000 mg | Freq: Four times a day (QID) | RECTAL | 0 refills | Status: DC | PRN

## 2022-12-03 MED ORDER — SODIUM CHLORIDE 0.9 % IV SOLN
12.5000 mg | Freq: Once | INTRAVENOUS | Status: AC
  Administered 2022-12-03: 12.5 mg via INTRAVENOUS
  Filled 2022-12-03: qty 0.5

## 2022-12-03 NOTE — ED Triage Notes (Signed)
Pt reports abdominal pain, n/v and diarrhea since yesterday. Pt states she is unable to keep anything down.  Pt seen by PMD this morning, given phenergan IM and advised to come to ED for eval. Pt states phenergan has resolved her nausea. NAD, AAOx4 in triage.

## 2022-12-03 NOTE — ED Notes (Signed)
Pt unable to urinate at this time, states "once I get fluids I will probably be able to".

## 2022-12-03 NOTE — Discharge Instructions (Signed)
You were seen in the emergency room today for nausea vomiting.  I have sent Protonix and Phenergan to your pharmacy.  Take Protonix as prescribed.  Take the Phenergan as needed for nausea.  Please follow-up with your primary care doctor to ensure resolution of symptoms.  Turn to the emergency room for new or worsening symptoms.

## 2022-12-03 NOTE — ED Notes (Signed)
Some Bloodwork Labs were drawn by PMD.  CBC results printed and with patient.

## 2022-12-03 NOTE — ED Provider Notes (Signed)
[facility] EMERGENCY DEPARTMENT AT [facility] Provider Note   CSN: [id number] Arrival date & time: 12/03/22  1029     History  Chief Complaint  Patient presents with   Abdominal Pain    Kristin Stephens is a 37 y.o. female with past medical history of hypertension, GERD, IBS, congenital heart abnormality presenting with 1 day of nausea vomiting and diarrhea.  She has had no episodes of diarrhea today.  She was seen by her primary care and given Phenergan which help nausea short term.  She is now having continued nausea and requesting more medications.  She is concerned for dehydration.  Reports she has not been tolerating oral intake for one day and some generalized weakness. She reports she has had one episode of vomiting brown in color. No bright red blood, episodes of vomiting here have been clear fluid.    Abdominal Pain      Home Medications Prior to Admission medications   Medication Sig Start Date End Date Taking? Authorizing Provider  acetaminophen (TYLENOL) 500 MG tablet Take 1,000-2,000 mg by mouth every 6 (six) hours as needed for mild pain, moderate pain, fever or headache.    [provider]  ARIPiprazole (ABILIFY) 20 MG tablet Take 1 tablet (20 mg total) by mouth daily. 11/12/22   Arfeen, Phillips Grout, MD  diclofenac Sodium (VOLTAREN) 1 % GEL Apply topically 4 (four) times daily.    [provider]  FLUoxetine (PROZAC) 40 MG capsule Take 1 capsule (40 mg total) by mouth daily. 08/28/22   Arfeen, Phillips Grout, MD  fluticasone (FLONASE) 50 MCG/ACT nasal spray USE 2 SPRAYS IN EACH NOSTRIL EVERY DAY 09/21/17   Merlyn Albert, MD  hydrOXYzine (ATARAX) 25 MG tablet Take 1 tablet (25 mg total) by mouth daily as needed for anxiety. 11/25/22   Arfeen, Phillips Grout, MD  ibuprofen (ADVIL) 200 MG tablet Take 200 mg by mouth every 6 (six) hours as needed for mild pain or headache.    [provider]  lamoTRIgine (LAMICTAL) 200 MG tablet Take 1 tablet (200 mg total)  by mouth at bedtime. 08/28/22   Arfeen, Phillips Grout, MD  lisdexamfetamine (VYVANSE) 40 MG capsule TAKE 1 CAPSULE BY MOUTH EVERY DAY 11/11/22   Thresa Ross, MD  loratadine (CLARITIN) 10 MG tablet Take 1 tablet by mouth daily as needed for allergies.    [provider]  medroxyPROGESTERone (DEPO-PROVERA) 150 MG/ML injection Inject 150 mg into the muscle every 3 (three) months.    [provider]  metoprolol succinate (TOPROL-XL) 25 MG 24 hr tablet TAKE ONE TABLET BY MOUTH TWICE DAY. 03/04/22   Pricilla Riffle, MD      Allergies    Sulfonamide derivatives, Coconut (cocos nucifera), and Coconut flavor    Review of Systems   Review of Systems  Gastrointestinal:  Positive for abdominal pain.    Physical Exam Updated Vital Signs BP 136/81 (BP Location: Right Arm)   Pulse 69   Temp 98.3 F (36.8 C)   Resp 20   SpO2 99%  Physical Exam Vitals and nursing note reviewed.  Constitutional:      General: She is not in acute distress.    Appearance: She is ill-appearing. She is not toxic-appearing.  HENT:     Head: Normocephalic and atraumatic.  Eyes:     General: No scleral icterus.    Conjunctiva/sclera: Conjunctivae normal.  Cardiovascular:     Rate and Rhythm: Normal rate and regular rhythm.  Pulses: Normal pulses.     Heart sounds: Normal heart sounds.  Pulmonary:     Effort: Pulmonary effort is normal. No respiratory distress.     Breath sounds: Normal breath sounds.  Abdominal:     General: Abdomen is flat. Bowel sounds are normal. There is no distension.     Palpations: Abdomen is soft. There is no mass.     Tenderness: There is abdominal tenderness. There is no right CVA tenderness, left CVA tenderness or guarding.     Comments: Generalized abd tenderness   Skin:    General: Skin is warm and dry.     Findings: No lesion.  Neurological:     General: No focal deficit present.     Mental Status: She is alert and oriented to person, place, and time. Mental status  is at baseline.     ED Results / Procedures / Treatments   Labs (all labs ordered are listed, but only abnormal results are displayed) Labs Reviewed  CBC - Abnormal; Notable for the following components:      Result Value   WBC 12.4 (*)    All other components within normal limits  LIPASE, BLOOD  COMPREHENSIVE METABOLIC PANEL  URINALYSIS, ROUTINE W REFLEX MICROSCOPIC  PREGNANCY, URINE    EKG None  Radiology No results found.  Procedures Procedures    Medications Ordered in ED Medications - No data to display  ED Course/ Medical Decision Making/ A&P Clinical Course as of 12/03/22 1341  Wed Dec 03, 2022  1314 Patient re-assessed, continues to have NV, now reporting throat pain d/t throwing up [JB]    Clinical Course User Index [JB] [place], Horald Chestnut, PA-C                                 Medical Decision Making Amount and/or Complexity of Data Reviewed Labs: ordered.  Risk OTC drugs. Prescription drug management.   Kristin Stephens 24 y.o. presented today forNVD. Working DDx includes, but not limited to, gastroenteritis, colitis, SBO, appendicitis, cholecystitis, hepatobiliary pathology, gastritis, PUD, ACS, dissection, pancreatitis, nephrolithiasis, AAA, UTI, pyelonephritis, ruptured ectopic pregnancy, PID, ovarian  R/o DDx: These are considered less likely than current impression due to history of present illness, physical exam, labs/imaging findings.  Pmhx: fibromyalgia, migraine, gerd, ibs, asthma, bipolar   Unique Tests and My Interpretation:  CBC: wbc 12.4 otherwise wnl CMP: K 2.9 Mg: wnl  Lipase: wnl UA: no nitrites, no leukocytes Urine Pregnancy: NEG Resp panel: NEG   Imaging:  None    Problem List / ED Course / Critical interventions / Medication management  Reporting to emergency room today with emesis nausea and diarrhea.  Patient reports that she has had episodes of vomit that bring up brownish color.  In the room patient was actively  vomiting clear fluid.  After Phenergan GI cocktail and Zofran Patient was tolerating oral intake.  Labs and imaging were reassuring -hemoglobin is stable. Imaging was not obtained d/t stable presentation and lack of abd pain, but encouraged pt to return if sx worsening and cont.  I ordered medication including Zofran, GI cocktail, Potassium, Phenergan  for NV and GI upset  Reevaluation of the patient after these medicines showed that the patient improved Patients vitals assessed. Upon arrival patient is hemodynamically stable.  I have reviewed the patients home medicines and have made adjustments as needed    Consult: none  Plan: I sent suppository Phenergan  to pharmacy.  And encourage patient to stay hydrated returning to emergency room if symptoms worsen or continue.  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 F/u w/ PCP in 2-3d to ensure resolution of sx.  Patient was given return precautions. Patient stable for discharge at this time.  Patient educated on current sx/dx and verbalized understanding of plan. Return to ER w/ new or worsening sx.          Final Clinical Impression(s) / ED Diagnoses Final diagnoses:  None    Rx / DC Orders ED Discharge Orders     None         Raford Pitcher, Horald Chestnut, PA-C 12/03/22 1925    Franne Forts, DO 12/09/22 0010

## 2022-12-04 ENCOUNTER — Ambulatory Visit (HOSPITAL_COMMUNITY): Payer: Commercial Managed Care - HMO | Admitting: Psychiatry

## 2022-12-04 ENCOUNTER — Other Ambulatory Visit (HOSPITAL_COMMUNITY): Payer: Self-pay | Admitting: Psychiatry

## 2022-12-04 DIAGNOSIS — F419 Anxiety disorder, unspecified: Secondary | ICD-10-CM

## 2022-12-04 DIAGNOSIS — F3131 Bipolar disorder, current episode depressed, mild: Secondary | ICD-10-CM

## 2022-12-05 MED ORDER — LAMOTRIGINE 200 MG PO TABS
200.0000 mg | ORAL_TABLET | Freq: Every day | ORAL | 0 refills | Status: DC
Start: 2022-12-05 — End: 2023-01-29

## 2022-12-05 MED ORDER — ARIPIPRAZOLE 20 MG PO TABS
20.0000 mg | ORAL_TABLET | Freq: Every day | ORAL | 0 refills | Status: DC
Start: 2022-12-05 — End: 2023-01-09

## 2022-12-07 ENCOUNTER — Emergency Department (HOSPITAL_BASED_OUTPATIENT_CLINIC_OR_DEPARTMENT_OTHER): Payer: Commercial Managed Care - HMO

## 2022-12-07 ENCOUNTER — Emergency Department (HOSPITAL_BASED_OUTPATIENT_CLINIC_OR_DEPARTMENT_OTHER)
Admission: EM | Admit: 2022-12-07 | Discharge: 2022-12-07 | Disposition: A | Discharge status: Home / Self Care | Payer: Commercial Managed Care - HMO | Attending: Emergency Medicine | Admitting: Emergency Medicine

## 2022-12-07 ENCOUNTER — Encounter (HOSPITAL_BASED_OUTPATIENT_CLINIC_OR_DEPARTMENT_OTHER): Payer: Self-pay

## 2022-12-07 DIAGNOSIS — R1013 Epigastric pain: Secondary | ICD-10-CM | POA: Insufficient documentation

## 2022-12-07 DIAGNOSIS — R112 Nausea with vomiting, unspecified: Secondary | ICD-10-CM | POA: Diagnosis not present

## 2022-12-07 LAB — COMPREHENSIVE METABOLIC PANEL
ALT: 81 U/L — ABNORMAL HIGH (ref 0–44)
AST: 42 U/L — ABNORMAL HIGH (ref 15–41)
Albumin: 4.7 g/dL (ref 3.5–5.0)
Alkaline Phosphatase: 85 U/L (ref 38–126)
Anion gap: 12 (ref 5–15)
BUN: 11 mg/dL (ref 6–20)
CO2: 24 mmol/L (ref 22–32)
Calcium: 9.6 mg/dL (ref 8.9–10.3)
Chloride: 100 mmol/L (ref 98–111)
Creatinine, Ser: 0.69 mg/dL (ref 0.44–1.00)
GFR, Estimated: >60 mL/min (ref >60)
Glucose, Bld: 99 mg/dL (ref 70–99)
Potassium: 3.1 mmol/L — ABNORMAL LOW (ref 3.5–5.1)
Sodium: 136 mmol/L (ref 135–145)
Total Bilirubin: 2.7 mg/dL — ABNORMAL HIGH (ref 0.3–1.2)
Total Protein: 7.3 g/dL (ref 6.5–8.1)

## 2022-12-07 LAB — CBC WITH DIFFERENTIAL/PLATELET
Abs Immature Granulocytes: 0.06 10*3/uL (ref 0.00–0.07)
Basophils Absolute: 0.1 10*3/uL (ref 0.0–0.1)
Basophils Relative: 1 %
Eosinophils Absolute: 0 10*3/uL (ref 0.0–0.5)
Eosinophils Relative: 0 %
HCT: 47 % — ABNORMAL HIGH (ref 36.0–46.0)
Hemoglobin: 16.5 g/dL — ABNORMAL HIGH (ref 12.0–15.0)
Immature Granulocytes: 1 %
Lymphocytes Relative: 14 %
Lymphs Abs: 1.4 10*3/uL (ref 0.7–4.0)
MCH: 29.5 pg (ref 26.0–34.0)
MCHC: 35.1 g/dL (ref 30.0–36.0)
MCV: 84.1 fL (ref 80.0–100.0)
Monocytes Absolute: 1 10*3/uL (ref 0.1–1.0)
Monocytes Relative: 10 %
Neutro Abs: 7.8 10*3/uL — ABNORMAL HIGH (ref 1.7–7.7)
Neutrophils Relative %: 74 %
Platelets: 384 10*3/uL (ref 150–400)
RBC: 5.59 MIL/uL — ABNORMAL HIGH (ref 3.87–5.11)
RDW: 12.7 % (ref 11.5–15.5)
WBC: 10.5 10*3/uL (ref 4.0–10.5)
nRBC: 0 % (ref 0.0–0.2)

## 2022-12-07 LAB — LIPASE, BLOOD: Lipase: 24 U/L (ref 11–51)

## 2022-12-07 MED ORDER — ALUM & MAG HYDROXIDE-SIMETH 200-200-20 MG/5ML PO SUSP
30.0000 mL | Freq: Once | ORAL | Status: AC
  Administered 2022-12-07: 30 mL via ORAL
  Filled 2022-12-07: qty 30

## 2022-12-07 MED ORDER — IOHEXOL 300 MG/ML  SOLN
100.0000 mL | Freq: Once | INTRAMUSCULAR | Status: AC | PRN
  Administered 2022-12-07: 100 mL via INTRAVENOUS

## 2022-12-07 MED ORDER — SODIUM CHLORIDE 0.9 % IV BOLUS
1000.0000 mL | Freq: Once | INTRAVENOUS | Status: AC
  Administered 2022-12-07: 1000 mL via INTRAVENOUS

## 2022-12-07 MED ORDER — ONDANSETRON 8 MG PO TBDP: 8.0000 mg | ORAL_TABLET | Freq: Three times a day (TID) | ORAL | 0 refills | Status: DC | PRN

## 2022-12-07 MED ORDER — ONDANSETRON 4 MG PO TBDP
4.0000 mg | ORAL_TABLET | Freq: Once | ORAL | Status: AC
  Administered 2022-12-07: 4 mg via ORAL
  Filled 2022-12-07: qty 1

## 2022-12-07 MED ORDER — PANTOPRAZOLE SODIUM 40 MG IV SOLR
40.0000 mg | Freq: Once | INTRAVENOUS | Status: AC
  Administered 2022-12-07: 40 mg via INTRAVENOUS
  Filled 2022-12-07: qty 10

## 2022-12-07 MED ORDER — ONDANSETRON HCL 4 MG/2ML IJ SOLN
4.0000 mg | Freq: Once | INTRAMUSCULAR | Status: AC
  Administered 2022-12-07: 4 mg via INTRAVENOUS
  Filled 2022-12-07: qty 2

## 2022-12-07 MED ORDER — HYDROMORPHONE HCL 1 MG/ML IJ SOLN
1.0000 mg | Freq: Once | INTRAMUSCULAR | Status: AC
  Administered 2022-12-07: 1 mg via INTRAVENOUS
  Filled 2022-12-07: qty 1

## 2022-12-07 MED ORDER — OXYCODONE-ACETAMINOPHEN 5-325 MG PO TABS
1.0000 | ORAL_TABLET | Freq: Once | ORAL | Status: AC
  Administered 2022-12-07: 1 via ORAL
  Filled 2022-12-07: qty 1

## 2022-12-07 NOTE — ED Provider Notes (Signed)
[place] EMERGENCY DEPARTMENT AT [facility] Provider Note   CSN: [id number] Arrival date & time: 12/07/22  4782     History  Chief Complaint  Patient presents with   Emesis    Kristin Stephens is a 37 y.o. female.  HPI    37 year old female with history of IBS comes in with chief complaint of abdominal pain.  Patient indicates that for the last 5 days she has been having nausea and vomiting.  She has seen some dark emesis as well.  Her pain is mostly in the epigastric and right upper quadrant region and is constant.  He was seen in the ER recently for the same complaint, and she was hesitant for getting CT scan at that time.  Patient states that the pain has no specific triggers.  She has not had a bowel movement in 4 days.  She thinks she is passing flatus.  No history of abdominal surgeries, but she has been told that maybe there is some gallbladder pathology.  Home Medications Prior to Admission medications   Medication Sig Start Date End Date Taking? Authorizing Provider  ondansetron (ZOFRAN-ODT) 8 MG disintegrating tablet Take 1 tablet (8 mg total) by mouth every 8 (eight) hours as needed for nausea. 12/07/22  Yes Derwood Kaplan, MD  acetaminophen (TYLENOL) 500 MG tablet Take 1,000-2,000 mg by mouth every 6 (six) hours as needed for mild pain, moderate pain, fever or headache.    [provider]  ARIPiprazole (ABILIFY) 20 MG tablet Take 1 tablet (20 mg total) by mouth daily. 12/05/22   Arfeen, Phillips Grout, MD  diclofenac Sodium (VOLTAREN) 1 % GEL Apply topically 4 (four) times daily.    [provider]  FLUoxetine (PROZAC) 40 MG capsule TAKE 1 CAPSULE BY MOUTH EVERY DAY 12/05/22   Arfeen, Phillips Grout, MD  fluticasone [facility]) 50 MCG/ACT nasal spray USE 2 SPRAYS IN EACH NOSTRIL EVERY DAY 09/21/17   Merlyn Albert, MD  hydrOXYzine (ATARAX) 25 MG tablet Take 1 tablet (25 mg total) by mouth daily as needed for anxiety. 11/25/22   Arfeen, Phillips Grout, MD  ibuprofen (ADVIL)  200 MG tablet Take 200 mg by mouth every 6 (six) hours as needed for mild pain or headache.    [provider]  lamoTRIgine (LAMICTAL) 200 MG tablet Take 1 tablet (200 mg total) by mouth at bedtime. 12/05/22   Arfeen, Phillips Grout, MD  lisdexamfetamine (VYVANSE) 40 MG capsule TAKE 1 CAPSULE BY MOUTH EVERY DAY 11/11/22   Thresa Ross, MD  loratadine (CLARITIN) 10 MG tablet Take 1 tablet by mouth daily as needed for allergies.    [provider]  medroxyPROGESTERone (DEPO-PROVERA) 150 MG/ML injection Inject 150 mg into the muscle every 3 (three) months.    [provider]  metoprolol succinate (TOPROL-XL) 25 MG 24 hr tablet TAKE ONE TABLET BY MOUTH TWICE DAY. 03/04/22   Pricilla Riffle, MD  pantoprazole (PROTONIX) 40 MG tablet Take 1 tablet (40 mg total) by mouth daily. 12/03/22   Barrett, Horald Chestnut, PA-C  promethazine (PHENERGAN) 25 MG suppository Place 1 suppository (25 mg total) rectally every 6 (six) hours as needed for nausea or vomiting. 12/03/22   Barrett, Horald Chestnut, PA-C      Allergies    Sulfonamide derivatives, Coconut (cocos nucifera), and Coconut flavor    Review of Systems   Review of Systems  All other systems reviewed and are negative.   Physical Exam Updated Vital Signs BP 114/75   Pulse  81   Temp 97.9 F (36.6 C) (Oral)   Resp 18   SpO2 99%  Physical Exam Vitals and nursing note reviewed.  Constitutional:      Appearance: She is well-developed.  HENT:     Head: Atraumatic.  Cardiovascular:     Rate and Rhythm: Normal rate.  Pulmonary:     Effort: Pulmonary effort is normal.  Abdominal:     Tenderness: There is abdominal tenderness. There is guarding. There is no rebound.     Comments: Patient has right upper quadrant and epigastric tenderness with guarding  Musculoskeletal:     Cervical back: Normal range of motion and neck supple.  Skin:    General: Skin is warm and dry.  Neurological:     Mental Status: She is alert and oriented to person, place,  and time.     ED Results / Procedures / Treatments   Labs (all labs ordered are listed, but only abnormal results are displayed) Labs Reviewed  CBC WITH DIFFERENTIAL/PLATELET - Abnormal; Notable for the following components:      Result Value   RBC 5.59 (*)    Hemoglobin 16.5 (*)    HCT 47.0 (*)    Neutro Abs 7.8 (*)    All other components within normal limits  COMPREHENSIVE METABOLIC PANEL - Abnormal; Notable for the following components:   Potassium 3.1 (*)    AST 42 (*)    ALT 81 (*)    Total Bilirubin 2.7 (*)    All other components within normal limits  LIPASE, BLOOD    EKG None  Radiology CT ABDOMEN PELVIS W CONTRAST  Result Date: 12/07/2022 CLINICAL DATA:  Epigastric pain and vomiting for 5 days. EXAM: CT ABDOMEN AND PELVIS WITH CONTRAST TECHNIQUE: Multidetector CT imaging of the abdomen and pelvis was performed using the standard protocol following bolus administration of intravenous contrast. RADIATION DOSE REDUCTION: This exam was performed according to the departmental dose-optimization program which includes automated exposure control, adjustment of the mA and/or kV according to patient size and/or use of iterative reconstruction technique. CONTRAST:  OMNIPAQUE IOHEXOL 300 MG/ML  SOLN COMPARISON:  03/05/2020 FINDINGS: Lower Chest: No acute findings. Hepatobiliary: Stable hypervascular mass with central scar in the anterior liver dome measuring approximately 6.6 cm, consistent with benign focal nodular hyperplasia. Gallbladder is unremarkable. No evidence of biliary ductal dilatation. Pancreas:  No mass or inflammatory changes. Spleen: Within normal limits in size and appearance. Adrenals/Urinary Tract: No suspicious masses identified. No evidence of ureteral calculi or hydronephrosis. Stomach/Bowel: No evidence of obstruction, inflammatory process or abnormal fluid collections. Normal appendix visualized. Vascular/Lymphatic: No pathologically enlarged lymph nodes. No  acute vascular findings. Reproductive:  No mass or other significant abnormality. Other:  None. Musculoskeletal:  No suspicious bone lesions identified. IMPRESSION: No acute findings within the abdomen or pelvis. 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 Electronically Signed   By: Danae Orleans M.D.   On: 12/07/2022 10:13    Procedures Procedures    Medications Ordered in ED Medications  ondansetron (ZOFRAN-ODT) disintegrating tablet 4 mg (has no administration in time range)  oxyCODONE-acetaminophen (PERCOCET/ROXICET) 5-325 MG per tablet 1 tablet (has no administration in time range)  alum & mag hydroxide-simeth (MAALOX/MYLANTA) 200-200-20 MG/5ML suspension 30 mL (has no administration in time range)  pantoprazole (PROTONIX) injection 40 mg (has no administration in time range)  sodium chloride 0.9 % bolus 1,000 mL (1,000 mLs Intravenous New Bag/Given 12/07/22 0832)  HYDROmorphone (DILAUDID) injection 1 mg (1  mg Intravenous Given 12/07/22 0833)  ondansetron (ZOFRAN) injection 4 mg (4 mg Intravenous Given 12/07/22 0833)  iohexol (OMNIPAQUE) 300 MG/ML solution 100 mL (100 mLs Intravenous Contrast Given 12/07/22 0930)    ED Course/ Medical Decision Making/ A&P                                 Medical Decision Making Amount and/or Complexity of Data Reviewed Labs: ordered. Radiology: ordered.  Risk OTC drugs. Prescription drug management.   This patient presents to the ED with chief complaint(s) of abdominal pain with pertinent past medical history of IBS, possible gallbladder pathology, but no evidence of gallstones.The complaint involves an extensive differential diagnosis and also carries with it a high risk of complications and morbidity.    The differential diagnosis includes : Pancreatitis, Hepatobiliary pathology including cholelithiasis and cholecystitis, Gastritis/peptic ulcer disease, small bowel obstruction, enteritis, biliary dyskinesia  The initial plan is to  get CT abdomen pelvis with contrast this visit.  I reviewed patient's previous ultrasound, she did not have any gallstones.  I discussed with the patient that we can proceed with ultrasound first followed by CT scan or start with CT scan, and she prefers going CT scan route first given that she has been told that there is no gallstones in the past.   Additional history obtained: Records reviewed  previous records including prior CT scan and ultrasound reviewed  Independent labs interpretation:  The following labs were independently interpreted: CBC, CMP are overall reassuring.  Independent visualization and interpretation of imaging: - I independently visualized the following imaging with scope of interpretation limited to determining acute life threatening conditions related to emergency care: CT abdomen pelvis, which revealed no clear evidence of cholecystitis.  Per radiology, there is no hepatobiliary pathology either.  No evidence of small bowel obstruction.  Treatment and Reassessment: Patient reassessed.  She feels better after hydromorphone. She is taking Protonix. I advised continued PCP follow-up. Return precautions discussed.  She might need GI consultation, but we will defer that to PCP.   Final Clinical Impression(s) / ED Diagnoses Final diagnoses:  Epigastric abdominal pain    Rx / DC Orders ED Discharge Orders          Ordered    ondansetron (ZOFRAN-ODT) 8 MG disintegrating tablet  Every 8 hours PRN        12/07/22 1058              Derwood Kaplan, MD 12/07/22 1116

## 2022-12-07 NOTE — ED Notes (Signed)
Reviewed discharge instructions, medications, and home care with pt. Pt verbalized understanding and had no further questions. Pt exited ED without complications.

## 2022-12-07 NOTE — ED Triage Notes (Signed)
She is here with c/o persistent vomiting. This problem began Tuesday (five days ago). She states she had seen "some dark blood in my vomit; but that has improved".

## 2022-12-07 NOTE — Discharge Instructions (Signed)
You were seen in the emergency room for abdominal pain.  CT scan today is negative for any pancreatitis, gallbladder issues.  Issues like gastritis will not be picked up on CAT scan.  We recommend continued plan to follow-up with PCP and for you to start taking your Protonix as prescribed.  We will add Zofran to your regimen as well for nausea.

## 2022-12-10 ENCOUNTER — Telehealth (HOSPITAL_COMMUNITY): Payer: Self-pay

## 2022-12-10 DIAGNOSIS — F900 Attention-deficit hyperactivity disorder, predominantly inattentive type: Secondary | ICD-10-CM

## 2022-12-10 MED ORDER — LISDEXAMFETAMINE DIMESYLATE 40 MG PO CAPS
40.0000 mg | ORAL_CAPSULE | Freq: Every day | ORAL | 0 refills | Status: DC
Start: 2022-12-10 — End: 2023-01-06

## 2022-12-10 NOTE — Telephone Encounter (Signed)
Patent is calling for a refill on her Vyvanse, she would like it sent to [facility] pharmacy, she has confirmed they have it in stock. Patient also wanted you to know she was in emergency this weekend for stomach pain and found out she has an ulcer. She is okay now, she just wanted you to be aware. Please review and advise, thank you

## 2022-12-10 NOTE — Telephone Encounter (Signed)
Done

## 2022-12-17 NOTE — Progress Notes (Signed)
37 y.o. [id number] Domestic Partner Caucasian female here for annual exam.  Wants to discuss surgery for tubal removal.  Patient declines future childbearing and would like permanent sterilization.  She had significant postpartum depression following the birth of her daughter.  Patient has significant concerns about the political environment and access to reliable birth control.   She does well with IM Depo but did not do well with SQ Depo, which caused a lot of irregular bleeding.   The Depo prevents pregnancy for her and also stabilizes her emotions.   She will see her psychiatrist at the end of October and discuss her plan for permanent sterilization and also a plan for stabilization of her mood.  She has bipolar disorder. She states she experiences depression after having general anesthesia.   New dx of of possible ulcer.  Had CT scan abdomen and pelvis 12/07/22 due to epigastric pain and vomiting.  She has focal nodular hyperplasia with a 6.6 cm hypervascular mass of the liver dome measuring 6.6 cm, consistent with benign focal nodular hyperplasia, which was noted to be stable.  On chart review, she had an MRI of the abdomen 11/03/11 showing a 7.0 x 6.5 d 5.7 cm lobulated enhancing lesion of the central hepatic dome consistent with focal nodular hyperplasia.   Lost 40 pounds.   Daughter is 76 yo going on 30 per patient.  Patient is engaged.  PCP:   Dr Lorain Childes  No LMP recorded. Patient has had an injection.           Sexually active: Yes.    The current method of family planning is Depo-Provera injections.    Exercising: Yes.     walking Smoker:  former, vaping  Health Maintenance: Pap:   09-19-20 Neg:Neg HR HPV, 2020 normal per patient, 10-26-14 Neg:Neg HR HPV  History of abnormal Pap:  yes,  hx of abnormal pap with cryotherapy to cervix age 89 per patient--normal since.  MMG:  n/a Colonoscopy:  05/20/13 normal BMD:   n/a  Result  n/a TDaP:  12/02/12 Gardasil:   yes HIV: 09/19/20  NR Hep C: 09/19/20 neg Screening Labs: PCP   reports that she has quit smoking. Her smoking use included cigarettes. She started smoking about 20 years ago. She has a 10.4 pack-year smoking history. She uses smokeless tobacco. She reports that she does not drink alcohol and does not use drugs.  Past Medical History:  Diagnosis Date   Abnormal Pap smear of cervix    age 29 with hx of cryotherapy   ADHD (attention deficit hyperactivity disorder)    Anxiety    Asthma    daily and prn inhalers   Bipolar affective disorder (HCC)    COVID 05/2020   Dental crowns present    Depression    Deviated nasal septum 05/2012   Eczema    right leg   Esophageal reflux    Fibromyalgia    Heart murmur    Hypertension    IBS (irritable bowel syndrome)    Migraine headache    with aura   Nasal turbinate hypertrophy 05/2012   bilateral   OCD (obsessive compulsive disorder)    STD (sexually transmitted disease)    Hx of abnormal pap--age 71    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     Past Surgical History:  Procedure Laterality Date   ANKLE RECONSTRUCTION Left 08/14/2021   Procedure: RECONSTRUCTION ANKLE Open debridement of left peroneal tendons;  Surgeon:  Netta Cedars, MD;  Location: [facility];  Service: Orthopedics;  Laterality: Left;   ASD AND VSD REPAIR  1989   CARDIAC CATHETERIZATION  06/18/2006   CESAREAN SECTION  04/12/2009   COARCTATION OF AORTA REPAIR  1989   COLONOSCOPY N/A 05/20/2013   Procedure: COLONOSCOPY;  Surgeon: West Bali, MD;  Location: [facility];  Service: Endoscopy;  Laterality: N/A;  115-moved to 1230 Leigh Ann notified pt   ESOPHAGOGASTRODUODENOSCOPY  04/04/2008     Normal esophagus/Mild patchy erythema in the antrum/ Normal duodenal bulb, normal small bowel biopsy   FLEXIBLE SIGMOIDOSCOPY  04/04/2008     Small internal hemorrhoids (poor bowel prep)   HEMORRHOID BANDING N/A 05/20/2013   Procedure: HEMORRHOID  BANDING;  Surgeon: West Bali, MD;  Location: [facility];  Service: Endoscopy;  Laterality: N/A;   NASAL SEPTOPLASTY W/ TURBINOPLASTY Bilateral 05/31/2012   Procedure: NASAL SEPTOPLASTY WITH TURBINATE REDUCTION;  Surgeon: Darletta Moll, MD;  Location: [facility];  Service: ENT;  Laterality: Bilateral;    Current Outpatient Medications  Medication Sig Dispense Refill   acetaminophen (TYLENOL) 500 MG tablet Take 1,000-2,000 mg by mouth every 6 (six) hours as needed for mild pain, moderate pain, fever or headache.     ARIPiprazole (ABILIFY) 20 MG tablet Take 1 tablet (20 mg total) by mouth daily. 30 tablet 0   diclofenac Sodium (VOLTAREN) 1 % GEL Apply topically 4 (four) times daily.     FLUoxetine (PROZAC) 40 MG capsule TAKE 1 CAPSULE BY MOUTH EVERY DAY 30 capsule 0   fluticasone (FLONASE) 50 MCG/ACT nasal spray USE 2 SPRAYS IN EACH NOSTRIL EVERY DAY 16 g 5   gabapentin (NEURONTIN) 100 MG capsule 1 capsule Orally three times a day for 30 days As needed for pain, caution sedation     hydrOXYzine (ATARAX) 25 MG tablet Take 1 tablet (25 mg total) by mouth daily as needed for anxiety. 15 tablet 0   ibuprofen (ADVIL) 200 MG tablet Take 200 mg by mouth every 6 (six) hours as needed for mild pain or headache.     lamoTRIgine (LAMICTAL) 200 MG tablet Take 1 tablet (200 mg total) by mouth at bedtime. 30 tablet 0   lisdexamfetamine (VYVANSE) 40 MG capsule Take 1 capsule (40 mg total) by mouth daily. 30 capsule 0   loratadine (CLARITIN) 10 MG tablet Take 1 tablet by mouth daily as needed for allergies.     medroxyPROGESTERone (DEPO-PROVERA) 150 MG/ML injection Inject 150 mg into the muscle every 3 (three) months.     metoprolol succinate (TOPROL-XL) 25 MG 24 hr tablet TAKE ONE TABLET BY MOUTH TWICE DAY. 180 tablet 3   ondansetron (ZOFRAN-ODT) 8 MG disintegrating tablet Take 1 tablet (8 mg total) by mouth every 8 (eight) hours as needed for nausea. 20 tablet 0   pantoprazole (PROTONIX) 40 MG  tablet Take 1 tablet (40 mg total) by mouth daily. 30 tablet 0   promethazine (PHENERGAN) 25 MG suppository Place 1 suppository (25 mg total) rectally every 6 (six) hours as needed for nausea or vomiting. 12 each 0   No current facility-administered medications for this visit.    Family History  Problem Relation Age of Onset   Cancer Mother        leukemia   Hyperlipidemia Mother    Hypertension Mother    Depression Mother    Physical abuse Mother    Sexual abuse Mother        possibly  Sleep apnea Mother    Stroke Mother    Personality disorder Father    Bipolar disorder Father    Alcohol abuse Father    ADD / ADHD Sister    Anxiety disorder Brother    ADD / ADHD Brother    Heart disease Maternal Grandmother    Dementia Maternal Grandmother    Stroke Maternal Grandfather    Prostate cancer Maternal Grandfather    Thyroid disease Maternal Grandfather    Emphysema Paternal Grandmother    Heart disease Paternal Grandmother    Bipolar disorder Paternal Grandmother    Dementia Paternal Grandmother    Seizures Daughter    Drug abuse Neg Hx    OCD Neg Hx    Paranoid behavior Neg Hx    Schizophrenia Neg Hx     Review of Systems  All other systems reviewed and are negative.   Exam:   BP 110/60 (BP Location: Right Arm, Patient Position: Sitting, Cuff Size: Normal)   Pulse 70   Ht 5' 5.25" (1.657 m)   Wt 195 lb (88.5 kg)   SpO2 98%   BMI 32.20 kg/m     General appearance: alert, cooperative and appears stated age Head: normocephalic, without obvious abnormality, atraumatic Neck: no adenopathy, supple, symmetrical, trachea midline and thyroid normal to inspection and palpation Lungs: clear to auscultation bilaterally Breasts: normal appearance, no masses or tenderness, No nipple retraction or dimpling, No nipple discharge or bleeding, No axillary adenopathy Heart: regular rate and rhythm Abdomen: soft, non-tender; no masses, no organomegaly Extremities: extremities  normal, atraumatic, no cyanosis or edema Skin: skin color, texture, turgor normal. No rashes or lesions Lymph nodes: cervical, supraclavicular, and axillary nodes normal. Neurologic: grossly normal  Pelvic: External genitalia:  no lesions              No abnormal inguinal nodes palpated.              Urethra:  normal appearing urethra with no masses, tenderness or lesions              Bartholins and Skenes: normal                 Vagina: normal appearing vagina with normal color and discharge, no lesions              Cervix: no lesions              Pap taken: no Bimanual Exam:  Uterus:  normal size, contour, position, consistency, mobility, non-tender              Adnexa: no mass, fullness, tenderness    Chaperone was present for exam:  Silas Flood  Assessment:   Well woman visit with gynecologic exam. Depo Provera usage.  Desire for permanent sterilization.  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 Bipolar disorder. Hx PP depression.  Focal nodular hyperplasia.  Stable. 6.6 cm.   Plan: Mammogram screening age 21. Self breast awareness reviewed. Pap and HR HPV 2027. Guidelines for Calcium, Vitamin D, regular exercise program including cardiovascular and weight bearing exercise. She will do her Tdap with PCP.  Ok for Depo Provera 150 mg IM today.  Will make referral to surgical gynecologist at our office.  Follow up annually and prn.    20  total time was spent for this patient encounter, including preparation, face-to-face counseling with the patient, coordination of care, and documentation of the encounter in addition to doing her annual exam today.  We reviewed focal nodular hyperplasia, and hormonal treatment that can affect this.  We also reviewed bilateral salpingectomy for permanent female sterilization and reduction of risk of tubal/ovarian cancer.  Procedure and recovery reviewed.  Ideally, she would come off the Depo provera if possible.  She will  need cardiac clearance for surgical care.  She will also need to sign tubal papers for sterilization.

## 2022-12-26 LAB — LAB REPORT - SCANNED: EGFR: 119

## 2022-12-31 ENCOUNTER — Encounter: Payer: Self-pay | Admitting: Obstetrics and Gynecology

## 2022-12-31 ENCOUNTER — Ambulatory Visit (INDEPENDENT_AMBULATORY_CARE_PROVIDER_SITE_OTHER): Payer: Managed Care, Other (non HMO) | Admitting: Obstetrics and Gynecology

## 2022-12-31 VITALS — BP 110/60 | HR 70 | Ht 65.25 in | Wt 195.0 lb

## 2022-12-31 DIAGNOSIS — Z01419 Encounter for gynecological examination (general) (routine) without abnormal findings: Secondary | ICD-10-CM | POA: Diagnosis not present

## 2022-12-31 DIAGNOSIS — Z3009 Encounter for other general counseling and advice on contraception: Secondary | ICD-10-CM

## 2022-12-31 DIAGNOSIS — Z3042 Encounter for surveillance of injectable contraceptive: Secondary | ICD-10-CM | POA: Diagnosis not present

## 2022-12-31 MED ORDER — MEDROXYPROGESTERONE ACETATE 150 MG/ML IM SUSY
150.0000 mg | PREFILLED_SYRINGE | Freq: Once | INTRAMUSCULAR | Status: AC
Start: 2022-12-31 — End: 2022-12-31
  Administered 2022-12-31: 150 mg via INTRAMUSCULAR

## 2022-12-31 NOTE — Patient Instructions (Signed)

## 2023-01-01 ENCOUNTER — Other Ambulatory Visit (HOSPITAL_COMMUNITY): Payer: Self-pay | Admitting: Psychiatry

## 2023-01-01 DIAGNOSIS — F3131 Bipolar disorder, current episode depressed, mild: Secondary | ICD-10-CM

## 2023-01-06 ENCOUNTER — Other Ambulatory Visit (HOSPITAL_COMMUNITY): Payer: Self-pay | Admitting: Psychiatry

## 2023-01-06 ENCOUNTER — Telehealth (HOSPITAL_COMMUNITY): Payer: Self-pay | Admitting: *Deleted

## 2023-01-06 DIAGNOSIS — F900 Attention-deficit hyperactivity disorder, predominantly inattentive type: Secondary | ICD-10-CM

## 2023-01-06 MED ORDER — LISDEXAMFETAMINE DIMESYLATE 40 MG PO CAPS
40.0000 mg | ORAL_CAPSULE | Freq: Every day | ORAL | 0 refills | Status: DC
Start: 2023-01-06 — End: 2023-01-29

## 2023-01-06 NOTE — Telephone Encounter (Signed)
Pt called to request a refill of the Vyvanse 40 mg caps. Medication was last e-scribed on 12/10/22. Pt last seen on 08/28/22 and has a f/u scheduled for 01/29/23. Please send to [facility].

## 2023-01-06 NOTE — Telephone Encounter (Signed)
Done

## 2023-01-09 ENCOUNTER — Other Ambulatory Visit (HOSPITAL_COMMUNITY): Payer: Self-pay | Admitting: *Deleted

## 2023-01-09 DIAGNOSIS — F3131 Bipolar disorder, current episode depressed, mild: Secondary | ICD-10-CM

## 2023-01-09 MED ORDER — ARIPIPRAZOLE 20 MG PO TABS
20.0000 mg | ORAL_TABLET | Freq: Every day | ORAL | 0 refills | Status: DC
Start: 2023-01-09 — End: 2023-01-29

## 2023-01-13 ENCOUNTER — Other Ambulatory Visit: Payer: Self-pay | Admitting: Internal Medicine

## 2023-01-13 ENCOUNTER — Other Ambulatory Visit (HOSPITAL_COMMUNITY): Payer: Self-pay | Admitting: Psychiatry

## 2023-01-13 ENCOUNTER — Encounter: Payer: Self-pay | Admitting: Obstetrics and Gynecology

## 2023-01-13 ENCOUNTER — Ambulatory Visit (INDEPENDENT_AMBULATORY_CARE_PROVIDER_SITE_OTHER): Payer: Managed Care, Other (non HMO) | Admitting: Obstetrics and Gynecology

## 2023-01-13 VITALS — BP 120/78 | HR 101

## 2023-01-13 DIAGNOSIS — N921 Excessive and frequent menstruation with irregular cycle: Secondary | ICD-10-CM | POA: Diagnosis not present

## 2023-01-13 DIAGNOSIS — N946 Dysmenorrhea, unspecified: Secondary | ICD-10-CM

## 2023-01-13 DIAGNOSIS — Z302 Encounter for sterilization: Secondary | ICD-10-CM | POA: Diagnosis not present

## 2023-01-13 DIAGNOSIS — N938 Other specified abnormal uterine and vaginal bleeding: Secondary | ICD-10-CM

## 2023-01-13 DIAGNOSIS — F3131 Bipolar disorder, current episode depressed, mild: Secondary | ICD-10-CM

## 2023-01-13 NOTE — Progress Notes (Signed)
37 y.o. y.o. female here for surgical consultation  G1P1 1 LTCS  No LMP recorded. Patient has had an injection.  Next one due 12/19 Has been on it off and on for about 10 years Had terrible postpartum depression and then found out after she had bipolar discorder. Had difficult time with developing attachment to baby with the depression and does not want to go through another pregnancy and is very worried about becoming pregnant. Her fiance is 22 and does not desire more children and is happy with helping raise her daughter. She is sensitive to hormones and has been controlled on depo but long term use and bone density could be an issue.  Had some mood instability noted on it but was not sure if she was not taking her psych medications incorrectly.   Periods off depo are extremely painful and heavy using tampon,pad and chuck and would bleed through these. She would bleed for 8 days a month. She is still having bothersome cramps though. She has also had a history of abnormal pap smears with cryo in the past  She will need cardiac clearance for surgery and she would like to talk to anesthesia about having any depression after anesthesia.  Blood pressure 120/78, pulse (!) 101, SpO2 99%.     Component Value Date/Time   DIAGPAP  09/19/2020 1029    - Negative for intraepithelial lesion or malignancy (NILM)   HPVHIGH Negative 09/19/2020 1029   ADEQPAP  09/19/2020 1029    Satisfactory for evaluation; transformation zone component PRESENT.    GYN HISTORY:    Component Value Date/Time   DIAGPAP  09/19/2020 1029    - Negative for intraepithelial lesion or malignancy (NILM)   HPVHIGH Negative 09/19/2020 1029   ADEQPAP  09/19/2020 1029    Satisfactory for evaluation; transformation zone component PRESENT.    OB History  Gravida Para Term Preterm AB Living  1 1 1     1   SAB IAB Ectopic Multiple Live Births               # Outcome Date GA Lbr Len/2nd Weight Sex Type Anes PTL Lv  1  Term             Past Medical History:  Diagnosis Date   Abnormal Pap smear of cervix    age 70 with hx of cryotherapy   ADHD (attention deficit hyperactivity disorder)    Anxiety    Asthma    daily and prn inhalers   Bipolar affective disorder (HCC)    COVID 05/2020   Dental crowns present    Depression    Deviated nasal septum 05/2012   Eczema    right leg   Esophageal reflux    Fibromyalgia    Focal nodular hyperplasia of liver    Heart murmur    Hypertension    IBS (irritable bowel syndrome)    Migraine headache    with aura   Nasal turbinate hypertrophy 05/2012   bilateral   OCD (obsessive compulsive disorder)    STD (sexually transmitted disease)    Hx of abnormal pap--age 69    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     Past Surgical History:  Procedure Laterality Date   ANKLE RECONSTRUCTION Left 08/14/2021   Procedure: RECONSTRUCTION ANKLE Open debridement of left peroneal tendons;  Surgeon: Netta Cedars, MD;  Location: [facility];  Service: Orthopedics;  Laterality: Left;   ASD  AND VSD REPAIR  1989   CARDIAC CATHETERIZATION  06/18/2006   CESAREAN SECTION  04/12/2009   COARCTATION OF AORTA REPAIR  1989   COLONOSCOPY N/A 05/20/2013   Procedure: COLONOSCOPY;  Surgeon: West Bali, MD;  [facility];  Service: Endoscopy;  Laterality: N/A;  115-moved to 1230 Leigh Ann notified pt   ESOPHAGOGASTRODUODENOSCOPY  04/04/2008     Normal esophagus/Mild patchy erythema in the antrum/ Normal duodenal bulb, normal small bowel biopsy   FLEXIBLE SIGMOIDOSCOPY  04/04/2008     Small internal hemorrhoids (poor bowel prep)   HEMORRHOID BANDING N/A 05/20/2013   Procedure: HEMORRHOID BANDING;  Surgeon: West Bali, MD;  [facility];  Service: Endoscopy;  Laterality: N/A;   NASAL SEPTOPLASTY W/ TURBINOPLASTY Bilateral 05/31/2012   Procedure: NASAL SEPTOPLASTY WITH TURBINATE REDUCTION;  Surgeon: Darletta Moll, MD;   Location: [facility];  Service: ENT;  Laterality: Bilateral;    Current Outpatient Medications on File Prior to Visit  Medication Sig Dispense Refill   acetaminophen (TYLENOL) 500 MG tablet Take 1,000-2,000 mg by mouth every 6 (six) hours as needed for mild pain, moderate pain, fever or headache.     ARIPiprazole (ABILIFY) 20 MG tablet Take 1 tablet (20 mg total) by mouth daily. 30 tablet 0   diclofenac Sodium (VOLTAREN) 1 % GEL Apply topically 4 (four) times daily.     FLUoxetine (PROZAC) 40 MG capsule TAKE 1 CAPSULE BY MOUTH EVERY DAY 30 capsule 0   fluticasone (FLONASE) 50 MCG/ACT nasal spray USE 2 SPRAYS IN EACH NOSTRIL EVERY DAY 16 g 5   gabapentin (NEURONTIN) 100 MG capsule 1 capsule Orally three times a day for 30 days As needed for pain, caution sedation     hydrOXYzine (ATARAX) 25 MG tablet Take 1 tablet (25 mg total) by mouth daily as needed for anxiety. 15 tablet 0   ibuprofen (ADVIL) 200 MG tablet Take 200 mg by mouth every 6 (six) hours as needed for mild pain or headache.     lamoTRIgine (LAMICTAL) 200 MG tablet Take 1 tablet (200 mg total) by mouth at bedtime. 30 tablet 0   lisdexamfetamine (VYVANSE) 40 MG capsule Take 1 capsule (40 mg total) by mouth daily. 30 capsule 0   loratadine (CLARITIN) 10 MG tablet Take 1 tablet by mouth daily as needed for allergies.     medroxyPROGESTERone (DEPO-PROVERA) 150 MG/ML injection Inject 150 mg into the muscle every 3 (three) months.     metoprolol succinate (TOPROL-XL) 25 MG 24 hr tablet TAKE ONE TABLET BY MOUTH TWICE DAY. 180 tablet 3   ondansetron (ZOFRAN-ODT) 8 MG disintegrating tablet Take 1 tablet (8 mg total) by mouth every 8 (eight) hours as needed for nausea. 20 tablet 0   pantoprazole (PROTONIX) 40 MG tablet Take 1 tablet (40 mg total) by mouth daily. 30 tablet 0   No current facility-administered medications on file prior to visit.    Social History   Socioeconomic History   Marital status: Media planner     Spouse name: Not on file   Number of children: 1   Years of education: GED   Highest education level: Not on file  Occupational History   Occupation: [employer]  Tobacco Use   Smoking status: Every Day   Smokeless tobacco: Current   Tobacco comments:    vape  Vaping Use   Vaping status: Every Day  Substance and Sexual Activity   Alcohol use: No    Comment: recovering  alcoholic 8 mos sober as of 04/15/17   Drug use: No   Sexual activity: Yes    Partners: Male    Birth control/protection: Injection    Comment: Depo Provera  Other Topics Concern   Not on file  Social History Narrative      Social Determinants of Health   Financial Resource Strain: Not on file  Food Insecurity: Not on file  Transportation Needs: Not on file  Physical Activity: Not on file  Stress: Not on file  Social Connections: Not on file  Intimate Partner Violence: Not on file    Family History  Problem Relation Age of Onset   Cancer Mother        leukemia   Hyperlipidemia Mother    Hypertension Mother    Depression Mother    Physical abuse Mother    Sexual abuse Mother        possibly   Sleep apnea Mother    Stroke Mother    Personality disorder Father    Bipolar disorder Father    Alcohol abuse Father    ADD / ADHD Sister    Anxiety disorder Brother    ADD / ADHD Brother    Heart disease Maternal Grandmother    Dementia Maternal Grandmother    Stroke Maternal Grandfather    Prostate cancer Maternal Grandfather    Thyroid disease Maternal Grandfather    Emphysema Paternal Grandmother    Heart disease Paternal Grandmother    Bipolar disorder Paternal Grandmother    Dementia Paternal Grandmother    Seizures Daughter    Drug abuse Neg Hx    OCD Neg Hx    Paranoid behavior Neg Hx    Schizophrenia Neg Hx      Allergies  Allergen Reactions   Sulfonamide Derivatives Swelling    SWELLING OF EYES WITH OPHTHALMIC SULFA   Coconut (Cocos Nucifera) Rash and Other (See Comments)   Coconut  Flavor Rash      Patient's last menstrual period was No LMP recorded. Patient has had an injection..            Review of Systems Alls systems reviewed and are negative.     OBGyn Exam    A:         Multiparity who desires permanent sterilization vs. RLH with menorrhagia, DUB, dysmenorrhea, bipolar.                             P:   Discussed in detail the bilateral salpingectomy and that it is 100% effective and does not have the risk of ectopic pregnancies in the future and lowers the risk of ovarian cancer.  Discussed if she changes her mind, she would have to have IVF, which is expensive and out of the pocket cost in most cases.  She is sure she doesn't want kids in the future.  She does not want to have periods as well or be on depo for long periods of time. We reviewed the [facility] and the r/b/a/I of each.  She voiced understanding that either case is not reversible and again that she does not want to conceive.  Another option is to have the salpingectomy and stay on depo but my concern is for her bone density over time.  Other options for cycle control discussed.  She would like to have her insurance checked for either procedure and costs for each.  30 minutes spent on reviewing records, imaging,  and one on one patient time and counseling patient and documentation Dr. Karma Greaser  No follow-ups on file.  Earley Favor

## 2023-01-28 ENCOUNTER — Telehealth: Payer: Self-pay | Admitting: Internal Medicine

## 2023-01-28 NOTE — Telephone Encounter (Signed)
Patient called to get check if Dr. Tenny Craw wants her to have an MRI test prior to next office visit.

## 2023-01-29 ENCOUNTER — Encounter (HOSPITAL_COMMUNITY): Payer: Self-pay | Admitting: Psychiatry

## 2023-01-29 ENCOUNTER — Ambulatory Visit (HOSPITAL_COMMUNITY): Payer: 59 | Admitting: Psychiatry

## 2023-01-29 VITALS — BP 144/92 | HR 105 | Ht 65.0 in | Wt 195.0 lb

## 2023-01-29 DIAGNOSIS — F900 Attention-deficit hyperactivity disorder, predominantly inattentive type: Secondary | ICD-10-CM

## 2023-01-29 DIAGNOSIS — F419 Anxiety disorder, unspecified: Secondary | ICD-10-CM | POA: Diagnosis not present

## 2023-01-29 DIAGNOSIS — F3131 Bipolar disorder, current episode depressed, mild: Secondary | ICD-10-CM | POA: Diagnosis not present

## 2023-01-29 MED ORDER — LAMOTRIGINE 200 MG PO TABS: 200.0000 mg | ORAL_TABLET | Freq: Every day | ORAL | 2 refills | Status: DC

## 2023-01-29 MED ORDER — LISDEXAMFETAMINE DIMESYLATE 40 MG PO CAPS: 40.0000 mg | ORAL_CAPSULE | Freq: Every day | ORAL | 0 refills | Status: DC

## 2023-01-29 MED ORDER — HYDROXYZINE HCL 25 MG PO TABS: 25.0000 mg | ORAL_TABLET | Freq: Every day | ORAL | 0 refills | Status: DC | PRN

## 2023-01-29 MED ORDER — ARIPIPRAZOLE 20 MG PO TABS: 20.0000 mg | ORAL_TABLET | Freq: Every day | ORAL | 2 refills | Status: DC

## 2023-01-29 NOTE — Progress Notes (Signed)
BH MD/PA/NP OP Progress Note  01/29/2023 3:29 PM Kristin Stephens  MRN:  [id number]  Chief Complaint:  Chief Complaint  Patient presents with   Follow-up   HPI: Patient came for her follow-up appointment.  She reported things are better but since season change she noticed lack of motivation to do things.  When she wake up in the morning she wants to stay in the bed because of the dark.  She denies any crying spells or any feeling of hopelessness or worthlessness.  She feels very happy and good when her 13 year old daughter stays with her overnight.  Patient told this is the first time after 10 years that she stayed at her place and she was very emotionally delighted.  She had a very good time with her daughter.  She reported job is going okay and finally things somewhat settled.  The person got 10 days of jail time and things are back to normal.  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  Her mother also getting treatment and in the February she will and the chemotherapy and review if she need to continue treatment.  Her mother diagnosed with leukemia.  Patient recently had blood work from her primary care Dr. Jacky Kindle and labs are normal.  Earlier in July she was seen in the emergency room because of abdominal pain and found to have high liver enzymes.  She lost weight.  She feels the current medicine is working.  We had increased Abilify on the last dose that had helped her depression.  She is wondering if she can try taking more hydroxyzine to help her anxiety.  She feels some nights not good sleep but in the morning she feels not as motivated to do things because of the darkness.  She also mentioned today that she had decided to either do hysterectomy or her tubes tied because she does not want kids.  Patient is talking to her OB/GYN for past 2 years.  She has not make the date yet but thinking seriously to have it done in the future.  She has no tremors, shakes or any EPS.  She like hydroxyzine that  keeps her calm.  Her attention concentration is good.  She is taking Vyvanse Monday to Friday.  She denies drinking or using any illegal substances.  Visit Diagnosis:    ICD-10-CM   1. Bipolar affective disorder, currently depressed, mild (HCC)  F31.31 ARIPiprazole (ABILIFY) 20 MG tablet    lamoTRIgine (LAMICTAL) 200 MG tablet    hydrOXYzine (ATARAX) 25 MG tablet    2. Anxiety  F41.9 hydrOXYzine (ATARAX) 25 MG tablet    3. Attention deficit hyperactivity disorder (ADHD), predominantly inattentive type  F90.0 lisdexamfetamine (VYVANSE) 40 MG capsule       Past Psychiatric History:  H/O mania, depression, anger, overdose and slashing her wrist.  Did IOP in 2016. Inpatient in October 2018. H/O ETOH. Took Adderall, Ativan, Gabapentin, Wellbutrin but limited response.      Past Medical History:  Past Medical History:  Diagnosis Date   Abnormal Pap smear of cervix    age 54 with hx of cryotherapy   ADHD (attention deficit hyperactivity disorder)    Anxiety    Asthma    daily and prn inhalers   Bipolar affective disorder (HCC)    COVID 05/2020   Dental crowns present    Depression    Deviated nasal septum 05/2012   Eczema    right leg   Esophageal reflux    Fibromyalgia  Focal nodular hyperplasia of liver    Heart murmur    Hypertension    IBS (irritable bowel syndrome)    Migraine headache    with aura   Nasal turbinate hypertrophy 05/2012   bilateral   OCD (obsessive compulsive disorder)    STD (sexually transmitted disease)    Hx of abnormal pap--age 41    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     Past Surgical History:  Procedure Laterality Date   ANKLE RECONSTRUCTION Left 08/14/2021   Procedure: RECONSTRUCTION ANKLE Open debridement of left peroneal tendons;  Surgeon: Netta Cedars, MD;  Location: [facility];  Service: Orthopedics;  Laterality: Left;   ASD AND VSD REPAIR  1989   CARDIAC CATHETERIZATION  06/18/2006    CESAREAN SECTION  04/12/2009   COARCTATION OF AORTA REPAIR  1989   COLONOSCOPY N/A 05/20/2013   Procedure: COLONOSCOPY;  Surgeon: West Bali, MD;  [facility];  Service: Endoscopy;  Laterality: N/A;  115-moved to 1230 Leigh Ann notified pt   ESOPHAGOGASTRODUODENOSCOPY  04/04/2008     Normal esophagus/Mild patchy erythema in the antrum/ Normal duodenal bulb, normal small bowel biopsy   FLEXIBLE SIGMOIDOSCOPY  04/04/2008     Small internal hemorrhoids (poor bowel prep)   HEMORRHOID BANDING N/A 05/20/2013   Procedure: HEMORRHOID BANDING;  Surgeon: West Bali, MD;  [facility];  Service: Endoscopy;  Laterality: N/A;   NASAL SEPTOPLASTY W/ TURBINOPLASTY Bilateral 05/31/2012   Procedure: NASAL SEPTOPLASTY WITH TURBINATE REDUCTION;  Surgeon: Darletta Moll, MD;  Location: [facility];  Service: ENT;  Laterality: Bilateral;    Family Psychiatric History: Reviewed.  Family History:  Family History  Problem Relation Age of Onset   Cancer Mother        leukemia   Hyperlipidemia Mother    Hypertension Mother    Depression Mother    Physical abuse Mother    Sexual abuse Mother        possibly   Sleep apnea Mother    Stroke Mother    Personality disorder Father    Bipolar disorder Father    Alcohol abuse Father    ADD / ADHD Sister    Anxiety disorder Brother    ADD / ADHD Brother    Heart disease Maternal Grandmother    Dementia Maternal Grandmother    Stroke Maternal Grandfather    Prostate cancer Maternal Grandfather    Thyroid disease Maternal Grandfather    Emphysema Paternal Grandmother    Heart disease Paternal Grandmother    Bipolar disorder Paternal Grandmother    Dementia Paternal Grandmother    Seizures Daughter    Drug abuse Neg Hx    OCD Neg Hx    Paranoid behavior Neg Hx    Schizophrenia Neg Hx     Social History:  Social History   Socioeconomic History   Marital status: Media planner    Spouse name: Not on file   Number  of children: 1   Years of education: GED   Highest education level: Not on file  Occupational History   Occupation: Librarian, academic  Tobacco Use   Smoking status: Every Day   Smokeless tobacco: Current   Tobacco comments:    vape  Vaping Use   Vaping status: Every Day  Substance and Sexual Activity   Alcohol use: No    Comment: recovering alcoholic 8 mos sober as of 04/15/17   Drug use:  No   Sexual activity: Yes    Partners: Male    Birth control/protection: Injection    Comment: Depo Provera  Other Topics Concern   Not on file  Social History Narrative      Social Determinants of Health   Financial Resource Strain: Not on file  Food Insecurity: Not on file  Transportation Needs: Not on file  Physical Activity: Not on file  Stress: Not on file  Social Connections: Not on file    Allergies:  Allergies  Allergen Reactions   Sulfonamide Derivatives Swelling    SWELLING OF EYES WITH OPHTHALMIC SULFA   Coconut (Cocos Nucifera) Rash and Other (See Comments)   Coconut Flavor Rash    Metabolic Disorder Labs: Lab Results  Component Value Date   HGBA1C 5.0 07/17/2014   MPG 97 08/28/2011   No results found for: "PROLACTIN" Lab Results  Component Value Date   CHOL 175 12/30/2018   TRIG 84 12/30/2018   HDL 45 12/30/2018   CHOLHDL 3.9 12/30/2018   VLDL 14 02/06/2007   LDLCALC 114 (H) 12/30/2018   LDLCALC 108 (H) 06/04/2017   Lab Results  Component Value Date   TSH 1.299 03/31/2008   TSH 1.424 Test methodology is 3rd generation TSH 02/05/2007    Therapeutic Level Labs: No results found for: "LITHIUM" No results found for: "VALPROATE" No results found for: "CBMZ"  Current Medications: Current Outpatient Medications  Medication Sig Dispense Refill   acetaminophen (TYLENOL) 500 MG tablet Take 1,000-2,000 mg by mouth every 6 (six) hours as needed for mild pain, moderate pain, fever or headache.     ARIPiprazole (ABILIFY) 20 MG tablet Take 1 tablet (20 mg total) by mouth  daily. 30 tablet 0   diclofenac Sodium (VOLTAREN) 1 % GEL Apply topically 4 (four) times daily.     FLUoxetine (PROZAC) 40 MG capsule TAKE 1 CAPSULE BY MOUTH EVERY DAY 30 capsule 0   fluticasone (FLONASE) 50 MCG/ACT nasal spray USE 2 SPRAYS IN EACH NOSTRIL EVERY DAY 16 g 5   gabapentin (NEURONTIN) 100 MG capsule 1 capsule Orally three times a day for 30 days As needed for pain, caution sedation     hydrOXYzine (ATARAX) 25 MG tablet Take 1 tablet (25 mg total) by mouth daily as needed for anxiety. 15 tablet 0   ibuprofen (ADVIL) 200 MG tablet Take 200 mg by mouth every 6 (six) hours as needed for mild pain or headache.     lamoTRIgine (LAMICTAL) 200 MG tablet Take 1 tablet (200 mg total) by mouth at bedtime. 30 tablet 0   lisdexamfetamine (VYVANSE) 40 MG capsule Take 1 capsule (40 mg total) by mouth daily. 30 capsule 0   loratadine (CLARITIN) 10 MG tablet Take 1 tablet by mouth daily as needed for allergies.     medroxyPROGESTERone (DEPO-PROVERA) 150 MG/ML injection Inject 150 mg into the muscle every 3 (three) months.     metoprolol succinate (TOPROL-XL) 25 MG 24 hr tablet TAKE 1 TABLET BY MOUTH 2 TIMES DAILY 90 tablet 0   ondansetron (ZOFRAN-ODT) 8 MG disintegrating tablet Take 1 tablet (8 mg total) by mouth every 8 (eight) hours as needed for nausea. 20 tablet 0   pantoprazole (PROTONIX) 40 MG tablet Take 1 tablet (40 mg total) by mouth daily. 30 tablet 0   No current facility-administered medications for this visit.     Musculoskeletal: Strength & Muscle Tone: within normal limits Gait & Station: normal Patient leans: N/A Psychiatric Specialty Exam: Physical Exam  Review  of Systems  Blood pressure (!) 144/92, pulse (!) 105, height 5\' 5"  (1.651 m), weight 195 lb (88.5 kg).Body mass index is 32.45 kg/m.  General Appearance:  wearing halloween costume  Eye Contact:  Good  Speech:  Clear and Coherent  Volume:  Normal  Mood:  Euthymic  Affect:  Appropriate  Thought Process:  Goal  Directed  Orientation:  Full (Time, Place, and Person)  Thought Content:  WDL  Suicidal Thoughts:  No  Homicidal Thoughts:  No  Memory:  Immediate;   Good Recent;   Good Remote;   Good  Judgement:  Intact  Insight:  Present  Psychomotor Activity:  Normal  Concentration:  Concentration: Good and Attention Span: Good  Recall:  Good  Fund of Knowledge:  Good  Language:  Good  Akathisia:  No  Handed:  Right  AIMS (if indicated):     Assets:  Communication Skills Desire for Improvement Housing Resilience Social Support Talents/Skills Transportation  ADL's:  Intact  Cognition:  WNL  Sleep:        Screenings: AIMS    Flowsheet Row [facility]  AIMS Total Score 0      AUDIT    Flowsheet Row [facility]  Alcohol Use Disorder Identification Test Final Score (AUDIT) 0      Mini-Mental    Flowsheet Row Office Visit from 07/31/2016 in [facility]  Total Score (max 30 points ) 29      PHQ2-9    Flowsheet Row Office Visit from 03/02/2017 in [facility]  PHQ-2 Total Score 2  PHQ-9 Total Score 11      Flowsheet Row ED from 12/07/2022 in [facility] at [facility] ED from 12/03/2022 in [facility] Department at [facility] Admission (Discharged) from 08/14/2021 in [facility]  C-SSRS RISK CATEGORY No Risk No Risk No Risk        Assessment and Plan: I review current medication, blood work results from Dr. Jacky Kindle.  Labs are normal.  She is doing better with the increased dose of Abilify.  She would like to take hydroxyzine more frequently because it is helping her anxiety.  Discussed possible seasonal affective disorder as usually does not do very well when she is in changes.  She is very happy about her daughter who visited to stay  overnight at her place.  Discussed continued to engaging her daughter in the future.  Discussed coping skills.  Patient has decided for hysterectomy or fallopian tube tied and I recommend should consult with OB/GYN in more detail to the discussed about risk and benefits and pros and cons with the procedure and aftermath.  Continue Vyvanse 40 mg Monday to Friday, continue Abilify 20 mg daily, continue hydroxyzine 25 mg to take as needed daily and Prozac 40 mg daily.  She has no tremor or shakes or any EPS.  Recommended to call us back if any question or any concern.  Follow-up in 3 months.     Collaboration of Care: Collaboration of Care: Other provider involved in patient's care AEB notes are available in epic to review.  Patient/Guardian was advised Release of Information must be obtained prior to any record release in order to collaborate their care with an outside provider. Patient/Guardian was advised if they have not already done so to contact the registration department to sign all necessary forms in order  for [place] to release information regarding their care.   Consent: Patient/Guardian gives verbal consent for treatment and assignment of benefits for services provided during this visit. Patient/Guardian expressed understanding and agreed to proceed.   I provided 28 minutes face-to-face time during this encounter.  Cleotis Nipper, MD 01/29/2023, 3:29 PM

## 2023-01-30 NOTE — Telephone Encounter (Signed)
I wouldn't get an MRI now

## 2023-02-04 NOTE — Telephone Encounter (Signed)
MyChart message sent to the pt.

## 2023-02-06 ENCOUNTER — Other Ambulatory Visit (HOSPITAL_COMMUNITY): Payer: Self-pay | Admitting: Psychiatry

## 2023-02-06 ENCOUNTER — Other Ambulatory Visit: Payer: Self-pay | Admitting: Internal Medicine

## 2023-02-06 DIAGNOSIS — F900 Attention-deficit hyperactivity disorder, predominantly inattentive type: Secondary | ICD-10-CM

## 2023-02-11 ENCOUNTER — Encounter: Payer: Self-pay | Admitting: *Deleted

## 2023-02-25 ENCOUNTER — Other Ambulatory Visit (HOSPITAL_COMMUNITY): Payer: Self-pay | Admitting: Psychiatry

## 2023-02-25 DIAGNOSIS — F419 Anxiety disorder, unspecified: Secondary | ICD-10-CM

## 2023-02-25 DIAGNOSIS — F3131 Bipolar disorder, current episode depressed, mild: Secondary | ICD-10-CM

## 2023-03-06 ENCOUNTER — Other Ambulatory Visit (HOSPITAL_COMMUNITY): Payer: Self-pay | Admitting: Psychiatry

## 2023-03-06 ENCOUNTER — Telehealth: Payer: Self-pay | Admitting: Internal Medicine

## 2023-03-06 DIAGNOSIS — F900 Attention-deficit hyperactivity disorder, predominantly inattentive type: Secondary | ICD-10-CM

## 2023-03-06 NOTE — Telephone Encounter (Signed)
Spoke with patient who is agreeable to do an office visit with Dr. Tenny Craw on 12/23 at 10:40 to address pre-op clearance.

## 2023-03-06 NOTE — Telephone Encounter (Signed)
   Name: PEGEEN ERA  DOB: 1985/10/04  MRN: [id number]  Primary Cardiologist: Dietrich Pates, MD  Chart reviewed as part of pre-operative protocol coverage. Because of Moriah Guarnieri Wunder's past medical history and time since last visit, she will require a follow-up in-office visit in order to better assess preoperative cardiovascular risk.  Pre-op covering staff: - Please schedule appointment and call patient to inform them. If patient already had an upcoming appointment within acceptable timeframe, please add "pre-op clearance" to the appointment notes so provider is aware. - Please contact requesting surgeon's office via preferred method (i.e, phone, fax) to inform them of need for appointment prior to surgery.     Roe Rutherford Iola Turri, PA  03/06/2023, 10:18 AM

## 2023-03-06 NOTE — Telephone Encounter (Signed)
   Pre-operative Risk Assessment  LAST VISIT: 03/04/2022  NEXT VISIT: NONE Patient Name: Kristin Stephens  DOB: 04-03-1985 MRN: [id number]      Request for Surgical Clearance    Procedure:   OPERATIVE LAPAROSCOPY WITH BILATERAL SALPINGECTOMY OR ROBOTIC LAPAROSCOPIC HYSTERECTOMY  Date of Surgery:  Clearance TBD                                 Surgeon:  Dr. Arman Filter Surgeon's Group or Practice Name:  [facility] Phone number:  [phone] Fax number:  [phone]   Type of Clearance Requested:   - Medical    Type of Anesthesia:  General    Additional requests/questions:    SignedRoyann Shivers   03/06/2023, 9:54 AM

## 2023-03-09 ENCOUNTER — Other Ambulatory Visit (HOSPITAL_BASED_OUTPATIENT_CLINIC_OR_DEPARTMENT_OTHER): Payer: Self-pay

## 2023-03-09 ENCOUNTER — Telehealth (HOSPITAL_COMMUNITY): Payer: Self-pay | Admitting: *Deleted

## 2023-03-09 DIAGNOSIS — F900 Attention-deficit hyperactivity disorder, predominantly inattentive type: Secondary | ICD-10-CM

## 2023-03-09 MED ORDER — LISDEXAMFETAMINE DIMESYLATE 40 MG PO CAPS
40.0000 mg | ORAL_CAPSULE | Freq: Every day | ORAL | 0 refills | Status: DC
  Filled 2023-03-09: qty 30, 30d supply, fill #0

## 2023-03-09 NOTE — Telephone Encounter (Signed)
I have sent her prescription to her requested pharmacy.

## 2023-03-09 NOTE — Telephone Encounter (Signed)
Pt called requesting a refill of the Vyvanse 40 mg every day. Last e-scribed on last visit 01/29/23. Pt asked that script be sent to [facility] (added to profile) as they currently have this med in stock. Pt has a f/u scheduled for 04/29/22. Please review.

## 2023-03-10 ENCOUNTER — Other Ambulatory Visit (HOSPITAL_COMMUNITY): Payer: Self-pay | Admitting: Psychiatry

## 2023-03-10 ENCOUNTER — Other Ambulatory Visit: Payer: Self-pay | Admitting: Internal Medicine

## 2023-03-10 DIAGNOSIS — F3131 Bipolar disorder, current episode depressed, mild: Secondary | ICD-10-CM

## 2023-03-10 DIAGNOSIS — F419 Anxiety disorder, unspecified: Secondary | ICD-10-CM

## 2023-03-14 NOTE — Progress Notes (Unsigned)
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 female with a history of ASD/VSD/Coarctation.  She is s/p repair in 1989.  Also hx of HTN, palpitaitons and biplar disorder  She has been followed with echoes and MRIs of her heart I last saw her in Aug 2022  Since seen she has done OK from a cardiac standpoint   Breathing is OK  No dizziness  No palpitations   No CP       She is working on diet   Still drinking Mtn Dew, cutting back   Has noticed her weight has gone down  I last saw the pt in Dec 2023  The pt says she had a stomach ulcer a few months ago   Lost weight   Appetite down   Healed  The pt denies CP   Breathing better with weight loss     No palpitations  Stressed out about the holidays    Rare vape   Quit tobacco 6 months ago    Diet Breakfast   Skip  Coffee creamer Sugar Lunch  Sandiwch or leftovers    Dinner   M/Thur  cheat day; has fast food  Otherwise makes dinner      Outpatient Medications Prior to Visit  Medication Sig Dispense Refill   acetaminophen (TYLENOL) 500 MG tablet Take 1,000-2,000 mg by mouth every 6 (six) hours as needed for mild pain, moderate pain, fever or headache.     ARIPiprazole (ABILIFY) 20 MG tablet Take 1 tablet (20 mg total) by mouth daily. 30 tablet 2   diclofenac Sodium (VOLTAREN) 1 % GEL Apply topically 4 (four) times daily.     FLUoxetine (PROZAC) 40 MG capsule TAKE 1 CAPSULE BY MOUTH EVERY DAY 30 capsule 0   fluticasone (FLONASE) 50 MCG/ACT nasal spray USE 2 SPRAYS IN EACH NOSTRIL EVERY DAY 16 g 5   gabapentin (NEURONTIN) 100 MG capsule 1 capsule Orally three times a day for 30 days As needed for pain, caution sedation     hydrOXYzine (ATARAX) 25 MG tablet Take 1 tablet (25 mg total) by mouth daily as  needed for anxiety. 30 tablet 0   ibuprofen (ADVIL) 200 MG tablet Take 200 mg by mouth every 6 (six) hours as needed for mild pain or headache.     lamoTRIgine (LAMICTAL) 200 MG tablet Take 1 tablet (200 mg total) by mouth at bedtime. 30 tablet 2   lisdexamfetamine (VYVANSE) 40 MG capsule Take 1 capsule (40 mg total) by mouth daily. 30 capsule 0   loratadine (CLARITIN) 10 MG tablet Take 1 tablet by mouth daily as needed for allergies.     medroxyPROGESTERone (DEPO-PROVERA) 150 MG/ML injection Inject 150 mg into the muscle every 3 (three) months.     metoprolol succinate (TOPROL-XL) 25 MG 24 hr tablet TAKE 1 TABLET BY MOUTH 2 TIMES DAILY 60 tablet 0   pantoprazole (PROTONIX) 40 MG tablet Take 1 tablet (40 mg total) by mouth daily. 30 tablet 0   ondansetron (ZOFRAN-ODT) 8 MG disintegrating tablet Take 1 tablet (8 mg total) by mouth every 8 (eight) hours as needed for nausea. (Patient not taking: Reported on 03/23/2023) 20 tablet 0   No facility-administered medications prior  to visit.     Allergies:   Sulfonamide derivatives, Coconut (cocos nucifera), and Coconut flavoring agent (non-screening)   Past Medical History:  Diagnosis Date   Abnormal Pap smear of cervix    age 53 with hx of cryotherapy   ADHD (attention deficit hyperactivity disorder)    Anxiety    Asthma    daily and prn inhalers   Bipolar affective disorder (HCC)    COVID 05/2020   Dental crowns present    Depression    Deviated nasal septum 05/2012   Eczema    right leg   Esophageal reflux    Fibromyalgia    Focal nodular hyperplasia of liver    Heart murmur    Hypertension    IBS (irritable bowel syndrome)    Migraine headache    with aura   Nasal turbinate hypertrophy 05/2012   bilateral   OCD (obsessive compulsive disorder)    STD (sexually transmitted disease)    Hx of abnormal pap--age 24    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     Past Surgical History:  Procedure Laterality  Date   ANKLE RECONSTRUCTION Left 08/14/2021   Procedure: RECONSTRUCTION ANKLE Open debridement of left peroneal tendons;  Surgeon: Netta Cedars, MD;  Location: [facility];  Service: Orthopedics;  Laterality: Left;   ASD AND VSD REPAIR  1989   CARDIAC CATHETERIZATION  06/18/2006   CESAREAN SECTION  04/12/2009   COARCTATION OF AORTA REPAIR  1989   COLONOSCOPY N/A 05/20/2013   Procedure: COLONOSCOPY;  Surgeon: West Bali, MD;  Location: [facility];  Service: Endoscopy;  Laterality: N/A;  115-moved to 1230 Leigh Ann notified pt   ESOPHAGOGASTRODUODENOSCOPY  04/04/2008     Normal esophagus/Mild patchy erythema in the antrum/ Normal duodenal bulb, normal small bowel biopsy   FLEXIBLE SIGMOIDOSCOPY  04/04/2008     Small internal hemorrhoids (poor bowel prep)   HEMORRHOID BANDING N/A 05/20/2013   Procedure: HEMORRHOID BANDING;  Surgeon: West Bali, MD;  Location: [facility];  Service: Endoscopy;  Laterality: N/A;   NASAL SEPTOPLASTY W/ TURBINOPLASTY Bilateral 05/31/2012   Procedure: NASAL SEPTOPLASTY WITH TURBINATE REDUCTION;  Surgeon: Darletta Moll, MD;  Location:  SURGERY CENTER;  Service: ENT;  Laterality: Bilateral;     Social History:  The patient  reports that she has been smoking. She uses smokeless tobacco. She reports that she does not drink alcohol and does not use drugs.   Family History:  The patient's family history includes ADD / ADHD in her brother and sister; Alcohol abuse in her father; Anxiety disorder in her brother; Bipolar disorder in her father and paternal grandmother; Cancer in her mother; Dementia in her maternal grandmother and paternal grandmother; Depression in her mother; Emphysema in her paternal grandmother; Heart disease in her maternal grandmother and paternal grandmother; Hyperlipidemia in her mother; Hypertension in her mother; Personality disorder in her father; Physical abuse in her mother; Prostate cancer in her maternal  grandfather; Seizures in her daughter; Sexual abuse in her mother; Sleep apnea in her mother; Stroke in her maternal grandfather and mother; Thyroid disease in her maternal grandfather.    ROS:  Please see the history of present illness. All other systems are reviewed and  Negative to the above problem except as noted.    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   CONTRAST:  9.5 mL Gadopiclenol Joanette Gula)   COMPARISON:  October 2017   FINDINGS: Cardiovascular: Preferential opacification of the thoracic aorta. Postsurgical changes of aortic coarctation repair without complicating feature. The ascending thoracic aorta measures 3.5 cm, within normal limits. Similar measurements of the aortic root measuring up to 3.9 cm, within expected limits. Normal heart size. No pericardial effusion.   Mediastinum/Nodes: No enlarged mediastinal, hilar, or axillary lymph nodes. The thyroid gland appears normal.   Lungs/Pleura: No pleural effusion. No mass or lobar consolidation.   Musculoskeletal: No aggressive osseous lesions.   Upper abdomen: Stable appearance of previously described focal nodular hyperplasia (FNH) in the right hepatic dome.   IMPRESSION: 1. Stable postsurgical changes of aortic coarctation repair without complicating  feature. No findings of thoracic aortic aneurysm. Aortic measurements remain stable since 2017 as described.      Echo: 11/12/20  eft ventricular ejection fraction, by estimation, is 65 to 70%. The left ventricle has normal function. The left ventricle has no regional wall motion abnormalities. Left ventricular diastolic parameters were normal. 1. Right ventricular systolic function is normal. The right ventricular size is normal. There is normal pulmonary artery systolic pressure. 2. 3. Right atrial size was mildly dilated. 4. The mitral valve is normal in structure. Trivial mitral valve regurgitation. Diffcult to see aortic leaflets well. . The aortic valve is tricuspid. Aortic valve regurgitation is not visualized. Mild aortic valve sclerosis is present, with no evidence of aortic valve stenosis. 5. Acoustic window is difficult for accurate measurements of aortic root s/p coarctation repair (1989) Peak gradient down descending aorta is 19 mm Hg (previous echo from 2021, peak gradient was 22 mm Hg).. Aortic dilatation noted. There is moderate dilatation of the aortic root, measuring 43 mm. 6. The inferior vena cava is normal in size with greater than 50% respiratory variability, suggesting right atrial pressure of 3 mmHg.    Lipid Panel    Component Value Date/Time   CHOL 175 12/30/2018 1007   TRIG 84 12/30/2018 1007   HDL 45 12/30/2018 1007   CHOLHDL 3.9 12/30/2018 1007   CHOLHDL 4.2 02/06/2007 0404   VLDL 14 02/06/2007 0404   LDLCALC 114 (H) 12/30/2018 1007      Wt Readings from Last 3 Encounters:  03/23/23 189 lb 6.4 oz (85.9 kg)  12/31/22 195 lb (88.5 kg)  03/04/22 208 lb (94.3 kg)      ASSESSMENT AND PLAN:  1  Congenital heart dz s/p ASD/ VSD/coarc repair (1989)   Pt is doing well     Last MRI Oct 2023  Follow periodically      2  Palpitations Pt denies     3   HTN   BP Is  fair  She says usually better  Follow    4 Morbid obesity  Weight down     Reviewed diet   Cut out processed foods  More veggies     Follow up 9 months       Current medicines are reviewed at length with the patient today.  The patient does not have concerns regarding medicines.  Signed, Dietrich Pates, MD  03/26/2023 12:03 AM    [facility] [address] [place], [place], [place]  [address] Phone: [phone]; Fax: [phone]

## 2023-03-19 ENCOUNTER — Ambulatory Visit (INDEPENDENT_AMBULATORY_CARE_PROVIDER_SITE_OTHER): Payer: Commercial Managed Care - HMO

## 2023-03-19 DIAGNOSIS — Z3042 Encounter for surveillance of injectable contraceptive: Secondary | ICD-10-CM | POA: Diagnosis not present

## 2023-03-19 MED ORDER — MEDROXYPROGESTERONE ACETATE 150 MG/ML IM SUSY
150.0000 mg | PREFILLED_SYRINGE | Freq: Once | INTRAMUSCULAR | Status: AC
  Administered 2023-03-19: 150 mg via INTRAMUSCULAR

## 2023-03-23 ENCOUNTER — Ambulatory Visit: Payer: Commercial Managed Care - HMO | Attending: Internal Medicine | Admitting: Internal Medicine

## 2023-03-23 ENCOUNTER — Other Ambulatory Visit (HOSPITAL_COMMUNITY): Payer: Self-pay | Admitting: Psychiatry

## 2023-03-23 ENCOUNTER — Encounter: Payer: Self-pay | Admitting: Internal Medicine

## 2023-03-23 VITALS — BP 136/82 | HR 94 | Ht 65.0 in | Wt 189.4 lb

## 2023-03-23 DIAGNOSIS — Z01810 Encounter for preprocedural cardiovascular examination: Secondary | ICD-10-CM

## 2023-03-23 DIAGNOSIS — F419 Anxiety disorder, unspecified: Secondary | ICD-10-CM

## 2023-03-23 DIAGNOSIS — F3131 Bipolar disorder, current episode depressed, mild: Secondary | ICD-10-CM

## 2023-03-23 NOTE — Patient Instructions (Addendum)
Medication Instructions:  Your physician recommends that you continue on your current medications as directed. Please refer to the Current Medication list given to you today.  *If you need a refill on your cardiac medications before your next appointment, please call your pharmacy*  Lab Work: None ordered today.  Testing/Procedures: None ordered today.  Follow-Up: At [facility], you and your health needs are our priority.  As part of our continuing mission to provide you with exceptional heart care, we have created designated Provider Care Teams.  These Care Teams include your primary Cardiologist (physician) and Advanced Practice Providers (APPs -  Physician Assistants and Nurse Practitioners) who all work together to provide you with the care you need, when you need it.  Your next appointment:   9 month(s)  The format for your next appointment:   In Person  Provider:   Dietrich Pates, MD

## 2023-04-08 ENCOUNTER — Telehealth (HOSPITAL_COMMUNITY): Payer: Self-pay | Admitting: *Deleted

## 2023-04-08 DIAGNOSIS — F900 Attention-deficit hyperactivity disorder, predominantly inattentive type: Secondary | ICD-10-CM

## 2023-04-08 MED ORDER — LISDEXAMFETAMINE DIMESYLATE 40 MG PO CAPS: 40.0000 mg | ORAL_CAPSULE | Freq: Every day | ORAL | 0 refills | Status: DC

## 2023-04-08 NOTE — Telephone Encounter (Signed)
 Done

## 2023-04-08 NOTE — Telephone Encounter (Signed)
 Pt called requesting a refill of the Vyvanse 40 mg. Last e-scribed 03/09/23. Pt last visit was on 01/29/23 and she has a f/u scheduled for 04/30/23. Please send to [facility]. Pt has verified they have medication in stock.

## 2023-04-09 ENCOUNTER — Other Ambulatory Visit: Payer: Self-pay | Admitting: Internal Medicine

## 2023-04-09 ENCOUNTER — Other Ambulatory Visit (HOSPITAL_COMMUNITY): Payer: Self-pay | Admitting: Psychiatry

## 2023-04-09 DIAGNOSIS — F3131 Bipolar disorder, current episode depressed, mild: Secondary | ICD-10-CM

## 2023-04-09 DIAGNOSIS — F419 Anxiety disorder, unspecified: Secondary | ICD-10-CM

## 2023-04-30 ENCOUNTER — Encounter (HOSPITAL_COMMUNITY): Payer: Self-pay | Admitting: Psychiatry

## 2023-04-30 ENCOUNTER — Other Ambulatory Visit (HOSPITAL_COMMUNITY): Payer: Self-pay | Admitting: Psychiatry

## 2023-04-30 ENCOUNTER — Ambulatory Visit (HOSPITAL_BASED_OUTPATIENT_CLINIC_OR_DEPARTMENT_OTHER): Payer: Commercial Managed Care - HMO | Admitting: Psychiatry

## 2023-04-30 ENCOUNTER — Other Ambulatory Visit: Payer: Self-pay

## 2023-04-30 VITALS — BP 129/80 | HR 118 | Ht 65.0 in | Wt 188.0 lb

## 2023-04-30 DIAGNOSIS — F900 Attention-deficit hyperactivity disorder, predominantly inattentive type: Secondary | ICD-10-CM

## 2023-04-30 DIAGNOSIS — F338 Other recurrent depressive disorders: Secondary | ICD-10-CM

## 2023-04-30 DIAGNOSIS — F419 Anxiety disorder, unspecified: Secondary | ICD-10-CM

## 2023-04-30 DIAGNOSIS — F3131 Bipolar disorder, current episode depressed, mild: Secondary | ICD-10-CM

## 2023-04-30 MED ORDER — HYDROXYZINE HCL 25 MG PO TABS: 25.0000 mg | ORAL_TABLET | Freq: Every day | ORAL | 0 refills | Status: DC | PRN

## 2023-04-30 MED ORDER — LISDEXAMFETAMINE DIMESYLATE 40 MG PO CAPS: 40.0000 mg | ORAL_CAPSULE | Freq: Every day | ORAL | 0 refills | Status: DC

## 2023-04-30 MED ORDER — FLUOXETINE HCL 40 MG PO CAPS: 40.0000 mg | ORAL_CAPSULE | Freq: Every day | ORAL | 0 refills | Status: DC

## 2023-04-30 MED ORDER — ARIPIPRAZOLE 20 MG PO TABS: 20.0000 mg | ORAL_TABLET | Freq: Every day | ORAL | 2 refills | Status: DC

## 2023-04-30 MED ORDER — LAMOTRIGINE 100 MG PO TABS: 50.0000 mg | ORAL_TABLET | Freq: Every day | ORAL | 0 refills | Status: DC

## 2023-04-30 NOTE — Progress Notes (Signed)
BH MD/PA/NP OP Progress Note  04/30/2023 3:14 PM Kristin Stephens  MRN:  [id number]  Chief Complaint:  Chief Complaint  Patient presents with   Follow-up   HPI: Patient came today for her follow-up appointment.  She reported being sick around Christmas and could not visit her mother but excited to visiting this weekend to [place].  She will see her mother and also her 36 year old daughter.  She admitted some time sedated, tired during the day.  Now she reported taking the low-dose trazodone at bedtime to help her sleep because she could not sleep some nights very well.  She is using old prescription of trazodone as we have not given trazodone in a while.  She is also taking hydroxyzine, Prozac, Lamictal and Abilify along with Vyvanse.  She admitted that she is not taking the Lamictal as prescribed because it is making her tired.  She is supposed to take 200 mg but only taking 100 mg.  Patient has multiple questions about her medication and not sure if any of the medication causing tiredness.  Overall she feels things are going well.  She is handling the situation much better than she anticipated.  She is learning the coping skills and applying in her daily life.  She was using Blietz to handle her seasonal affective disorder and that had helped her a lot.  Relationship with the boyfriend is going well.  She denies any crying spells, feeling of hopelessness or worthlessness.  Her attention concentration is good.  Her appetite is okay and she is trying to lose weight.  She realized that she had lost weight since she stopped or at least cut down soda significantly.  She does work Monday to Friday and some days she works Saturday half a day and takes the Vyvanse those Saturdays.  She has no tremors, shakes or any EPS.  She denies any suicidal thoughts or homicidal thoughts.  She did not like the Abilify but is keeping her mood stable and denies any mania psychosis, anger.  She denies drinking or using any  illegal substances.  Visit Diagnosis:    ICD-10-CM   1. Bipolar affective disorder, currently depressed, mild (HCC)  F31.31 ARIPiprazole (ABILIFY) 20 MG tablet    FLUoxetine (PROZAC) 40 MG capsule    hydrOXYzine (ATARAX) 25 MG tablet    lamoTRIgine (LAMICTAL) 100 MG tablet    2. Anxiety  F41.9 FLUoxetine (PROZAC) 40 MG capsule    hydrOXYzine (ATARAX) 25 MG tablet    3. Attention deficit hyperactivity disorder (ADHD), predominantly inattentive type  F90.0 lisdexamfetamine (VYVANSE) 40 MG capsule        Past Psychiatric History:  H/O mania, depression, anger, overdose and slashing her wrist.  Did IOP in 2016. Inpatient in October 2018. H/O ETOH. Took Adderall, Ritalin, Ativan, Gabapentin, Wellbutrin but limited response.      Past Medical History:  Past Medical History:  Diagnosis Date   Abnormal Pap smear of cervix    age 83 with hx of cryotherapy   ADHD (attention deficit hyperactivity disorder)    Anxiety    Asthma    daily and prn inhalers   Bipolar affective disorder (HCC)    COVID 05/2020   Dental crowns present    Depression    Deviated nasal septum 05/2012   Eczema    right leg   Esophageal reflux    Fibromyalgia    Focal nodular hyperplasia of liver    Heart murmur    Hypertension  IBS (irritable bowel syndrome)    Migraine headache    with aura   Nasal turbinate hypertrophy 05/2012   bilateral   OCD (obsessive compulsive disorder)    STD (sexually transmitted disease)    Hx of abnormal pap--age 34    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     Past Surgical History:  Procedure Laterality Date   ANKLE RECONSTRUCTION Left 08/14/2021   Procedure: RECONSTRUCTION ANKLE Open debridement of left peroneal tendons;  Surgeon: Netta Cedars, MD;  Location: [facility];  Service: Orthopedics;  Laterality: Left;   ASD AND VSD REPAIR  1989   CARDIAC CATHETERIZATION  06/18/2006   CESAREAN SECTION  04/12/2009   COARCTATION OF  AORTA REPAIR  1989   COLONOSCOPY N/A 05/20/2013   Procedure: COLONOSCOPY;  Surgeon: West Bali, MD;  [facility];  Service: Endoscopy;  Laterality: N/A;  115-moved to 1230 Leigh Ann notified pt   ESOPHAGOGASTRODUODENOSCOPY  04/04/2008     Normal esophagus/Mild patchy erythema in the antrum/ Normal duodenal bulb, normal small bowel biopsy   FLEXIBLE SIGMOIDOSCOPY  04/04/2008     Small internal hemorrhoids (poor bowel prep)   HEMORRHOID BANDING N/A 05/20/2013   Procedure: HEMORRHOID BANDING;  Surgeon: West Bali, MD;  [facility]: [facility];  Service: Endoscopy;  Laterality: N/A;   NASAL SEPTOPLASTY W/ TURBINOPLASTY Bilateral 05/31/2012   Procedure: NASAL SEPTOPLASTY WITH TURBINATE REDUCTION;  Surgeon: Darletta Moll, MD;  Location: [facility];  Service: ENT;  Laterality: Bilateral;    Family Psychiatric History: Reviewed.  Family History:  Family History  Problem Relation Age of Onset   Cancer Mother        leukemia   Hyperlipidemia Mother    Hypertension Mother    Depression Mother    Physical abuse Mother    Sexual abuse Mother        possibly   Sleep apnea Mother    Stroke Mother    Personality disorder Father    Bipolar disorder Father    Alcohol abuse Father    ADD / ADHD Sister    Anxiety disorder Brother    ADD / ADHD Brother    Heart disease Maternal Grandmother    Dementia Maternal Grandmother    Stroke Maternal Grandfather    Prostate cancer Maternal Grandfather    Thyroid disease Maternal Grandfather    Emphysema Paternal Grandmother    Heart disease Paternal Grandmother    Bipolar disorder Paternal Grandmother    Dementia Paternal Grandmother    Seizures Daughter    Drug abuse Neg Hx    OCD Neg Hx    Paranoid behavior Neg Hx    Schizophrenia Neg Hx     Social History:  Social History   Socioeconomic History   Marital status: Media planner    Spouse name: Not on file   Number of children: 1   Years of education: GED    Highest education level: Not on file  Occupational History   Occupation: Librarian, academic  Tobacco Use   Smoking status: Every Day   Smokeless tobacco: Current   Tobacco comments:    vape  Vaping Use   Vaping status: Every Day  Substance and Sexual Activity   Alcohol use: No    Comment: recovering alcoholic 8 mos sober as of 04/15/17   Drug use: No   Sexual activity: Yes    Partners: Male    Birth control/protection: Injection  Comment: Depo Provera  Other Topics Concern   Not on file  Social History Narrative      Social Drivers of Health   Financial Resource Strain: Not on file  Food Insecurity: Not on file  Transportation Needs: Not on file  Physical Activity: Not on file  Stress: Not on file  Social Connections: Not on file    Allergies:  Allergies  Allergen Reactions   Sulfonamide Derivatives Swelling    SWELLING OF EYES WITH OPHTHALMIC SULFA   Coconut (Cocos Nucifera) Rash and Other (See Comments)   Coconut Flavoring Agent (Non-Screening) Rash    Metabolic Disorder Labs: Lab Results  Component Value Date   HGBA1C 5.0 07/17/2014   MPG 97 08/28/2011   No results found for: "PROLACTIN" Lab Results  Component Value Date   CHOL 175 12/30/2018   TRIG 84 12/30/2018   HDL 45 12/30/2018   CHOLHDL 3.9 12/30/2018   VLDL 14 02/06/2007   LDLCALC 114 (H) 12/30/2018   LDLCALC 108 (H) 06/04/2017   Lab Results  Component Value Date   TSH 1.299 03/31/2008   TSH 1.424 Test methodology is 3rd generation TSH 02/05/2007    Therapeutic Level Labs: No results found for: "LITHIUM" No results found for: "VALPROATE" No results found for: "CBMZ"  Current Medications: Current Outpatient Medications  Medication Sig Dispense Refill   acetaminophen (TYLENOL) 500 MG tablet Take 1,000-2,000 mg by mouth every 6 (six) hours as needed for mild pain, moderate pain, fever or headache.     ARIPiprazole (ABILIFY) 20 MG tablet Take 1 tablet (20 mg total) by mouth daily. 30 tablet 2    diclofenac Sodium (VOLTAREN) 1 % GEL Apply topically 4 (four) times daily.     FLUoxetine (PROZAC) 40 MG capsule TAKE 1 CAPSULE BY MOUTH EVERY DAY 30 capsule 0   fluticasone (FLONASE) 50 MCG/ACT nasal spray USE 2 SPRAYS IN EACH NOSTRIL EVERY DAY 16 g 5   gabapentin (NEURONTIN) 100 MG capsule 1 capsule Orally three times a day for 30 days As needed for pain, caution sedation     hydrOXYzine (ATARAX) 25 MG tablet Take 1 tablet (25 mg total) by mouth daily as needed for anxiety. 30 tablet 0   ibuprofen (ADVIL) 200 MG tablet Take 200 mg by mouth every 6 (six) hours as needed for mild pain or headache.     lamoTRIgine (LAMICTAL) 200 MG tablet Take 1 tablet (200 mg total) by mouth at bedtime. 30 tablet 2   lisdexamfetamine (VYVANSE) 40 MG capsule Take 1 capsule (40 mg total) by mouth daily. 30 capsule 0   loratadine (CLARITIN) 10 MG tablet Take 1 tablet by mouth daily as needed for allergies.     medroxyPROGESTERone (DEPO-PROVERA) 150 MG/ML injection Inject 150 mg into the muscle every 3 (three) months.     metoprolol succinate (TOPROL-XL) 25 MG 24 hr tablet TAKE 1 TABLET BY MOUTH 2 TIMES DAILY 60 tablet 8   ondansetron (ZOFRAN-ODT) 8 MG disintegrating tablet Take 1 tablet (8 mg total) by mouth every 8 (eight) hours as needed for nausea. 20 tablet 0   pantoprazole (PROTONIX) 40 MG tablet Take 1 tablet (40 mg total) by mouth daily. 30 tablet 0   No current facility-administered medications for this visit.     Musculoskeletal: Strength & Muscle Tone: within normal limits Gait & Station: normal Patient leans: N/A Psychiatric Specialty Exam: Physical Exam  Review of Systems  Blood pressure 129/80, pulse (!) 118, height 5\' 5"  (1.651 m), weight 188 lb (85.3  kg).Body mass index is 31.28 kg/m.  General Appearance:  wearing halloween costume  Eye Contact:  Good  Speech:  Normal Rate  Volume:  Normal  Mood:  Euthymic  Affect:  Appropriate  Thought Process:  Goal Directed  Orientation:  Full (Time,  Place, and Person)  Thought Content:  WDL  Suicidal Thoughts:  No  Homicidal Thoughts:  No  Memory:  Immediate;   Good Recent;   Good Remote;   Good  Judgement:  Intact  Insight:  Present  Psychomotor Activity:  Normal  Concentration:  Concentration: Good and Attention Span: Good  Recall:  Good  Fund of Knowledge:  Good  Language:  Good  Akathisia:  No  Handed:  Right  AIMS (if indicated):     Assets:  Communication Skills Desire for Improvement Housing Resilience Social Support Talents/Skills Transportation  ADL's:  Intact  Cognition:  WNL  Sleep:   Okay with trazodone     Screenings: AIMS    Flowsheet Row [facility]  AIMS Total Score 0      AUDIT    Flowsheet Row [facility]  Alcohol Use Disorder Identification Test Final Score (AUDIT) 0      Mini-Mental    Flowsheet Row Office Visit from 07/31/2016 in [facility]  Total Score (max 30 points ) 29      PHQ2-9    Flowsheet Row Office Visit from 03/02/2017 in [facility]  PHQ-2 Total Score 2  PHQ-9 Total Score 11      Flowsheet Row ED from 12/07/2022 in [facility] at [facility] ED from 12/03/2022 in [facility] at [facility] Admission (Discharged) from 08/14/2021 in [facility]  C-SSRS RISK CATEGORY No Risk No Risk No Risk        Assessment and Plan:  I reviewed current medication and discussed polypharmacy.  Recommend not to take the trazodone since patient already taking hydroxyzine.  Recommend to take the hydroxyzine at nighttime to help her sleep rather than taking anticholinergic medication.  She also feel that Lamictal is causing tiredness.  She supposed to take 200 and only taking 100 because could not handle the dose.  I recommend she can try 50 mg  Lamictal however if noticed that symptoms are coming back and feeling more emotional, irritable, angry that we need to take the Lamictal back to 100.  Overall she feels her mood is stable and she is very happy.  She is handling situation much better.  She also wondering if ADHD medicine needs to be changed however Vyvanse has helped her a lot and I would not suggest to try a different medication.  Continue Abilify 20 mg daily, Prozac 40 mg daily.  She had lost weight since she cut down her soda.  Encourage watching her calorie intake, healthy diet and exercise.  She has no major issues or concerns from the medication.  She has no rash or any itching.  Encouraged to use extra lites and walk during the daytime to deal with seasonal affective disorder which is working very well.  She is going more outside and that has been helpful.  Recommended to call us back if she is any question or any concern.  Will follow-up in 3 months.   Collaboration of Care: Collaboration of Care: Other provider involved in patient's care AEB notes are available in epic to  review.  Patient/Guardian was advised Release of Information must be obtained prior to any record release in order to collaborate their care with an outside provider. Patient/Guardian was advised if they have not already done so to contact the registration department to sign all necessary forms in order for [place] to release information regarding their care.   Consent: Patient/Guardian gives verbal consent for treatment and assignment of benefits for services provided during this visit. Patient/Guardian expressed understanding and agreed to proceed.   I provided 30 minutes face-to-face time during this encounter.  Cleotis Nipper, MD 04/30/2023, 3:14 PM

## 2023-05-01 ENCOUNTER — Telehealth: Payer: Self-pay

## 2023-05-01 NOTE — Telephone Encounter (Signed)
Will call when in office and follow up with the pt.

## 2023-05-01 NOTE — Telephone Encounter (Signed)
-----   Message from [place] sent at 05/01/2023 12:11 PM EST ----- I would like the pt to come in some point when you are here for BP check and EKG   (looking at medicine effect on EKG)

## 2023-05-05 ENCOUNTER — Ambulatory Visit (INDEPENDENT_AMBULATORY_CARE_PROVIDER_SITE_OTHER): Payer: Self-pay

## 2023-05-05 ENCOUNTER — Telehealth: Payer: Self-pay | Admitting: *Deleted

## 2023-05-05 ENCOUNTER — Encounter: Payer: Self-pay | Admitting: Internal Medicine

## 2023-05-05 ENCOUNTER — Ambulatory Visit: Payer: Medicaid Other | Attending: Internal Medicine

## 2023-05-05 VITALS — BP 138/90 | HR 93 | Wt 188.2 lb

## 2023-05-05 DIAGNOSIS — R002 Palpitations: Secondary | ICD-10-CM | POA: Diagnosis not present

## 2023-05-05 DIAGNOSIS — Q251 Coarctation of aorta: Secondary | ICD-10-CM | POA: Diagnosis not present

## 2023-05-05 NOTE — Progress Notes (Signed)
   Nurse Visit   Date of Encounter: 05/05/2023 ID: Kristin Stephens, DOB 02-26-1986, MRN [id number]  PCP:  Suan Elm, MD   [facility] Providers Cardiologist:  Ola Berger, MD      Visit Details   VS:  BP (!) 138/90   Pulse 93   Wt 188 lb 3.2 oz (85.4 kg)   SpO2 98%   BMI 31.32 kg/m  , BMI Body mass index is 31.32 kg/m.  Wt Readings from Last 3 Encounters:  05/05/23 188 lb 3.2 oz (85.4 kg)  03/23/23 189 lb 6.4 oz (85.9 kg)  12/31/22 195 lb (88.5 kg)     Reason for visit: ECG AND BP  Performed today: ECG, VS, MEDS  Changes (medications, testing, etc.) :  Length of Visit: 25 minutes    Medications Adjustments/Labs and Tests Ordered: Orders Placed This Encounter  Procedures   EKG 12-Lead   EKG 12-Lead   No orders of the defined types were placed in this encounter.  ECG SHOWN TO DR ROSS AS SHE WAS IN CLINIC TODAY. PT HAS BEEN FEELING WELL BUT APPLE WATCH HAS BEEN SAYING POSSIBLE AFIB 2% OVER SEVERAL DAYS. SHE DENIES PALPS AND IRREG HEART BEAT. NO DIZZINESS, SOB. NO STRIPS SHOWING ANY AFIB SINCE SHE HAS NOT HAD A REASON TO RECORD ANY.   PT SAYS SHE HAS NOT BEEN SMOKING MANY CIGARETTES LATELY BUT SHE HAS BEEN VAPING.   PER DR ROSS PT TO HAVE A 2 WEEK ZIO.     Signed, Alanna Alley, RN  05/05/2023 3:42 PM

## 2023-05-05 NOTE — Progress Notes (Unsigned)
 Enrolled for [employer] to mail a ZIO XT long term holter monitor to the patients address on file.

## 2023-05-05 NOTE — Telephone Encounter (Signed)
 Left a message for the pt to call back.

## 2023-05-05 NOTE — Patient Instructions (Signed)
 Medication Instructions:   *If you need a refill on your cardiac medications before your next appointment, please call your pharmacy*   Lab Work:  If you have labs (blood work) drawn today and your tests are completely normal, you will receive your results only by: MyChart Message (if you have MyChart) OR A paper copy in the mail If you have any lab test that is abnormal or we need to change your treatment, we will call you to review the results.   Testing/Procedures:    Follow-Up: At [facility], you and your health needs are our priority.  As part of our continuing mission to provide you with exceptional heart care, we have created designated Provider Care Teams.  These Care Teams include your primary Cardiologist (physician) and Advanced Practice Providers (APPs -  Physician Assistants and Nurse Practitioners) who all work together to provide you with the care you need, when you need it.  We recommend signing up for the patient portal called "MyChart".  Sign up information is provided on this After Visit Summary.  MyChart is used to connect with patients for Virtual Visits (Telemedicine).  Patients are able to view lab/test results, encounter notes, upcoming appointments, etc.  Non-urgent messages can be sent to your provider as well.   To learn more about what you can do with MyChart, go to [email or web address].

## 2023-05-05 NOTE — Telephone Encounter (Signed)
Pt says she has been having irregular heart beat more often and has had some readings on her apple watch which she will try to send [place] through My Chart.   She will come in today for her EKG and BP.

## 2023-05-05 NOTE — Telephone Encounter (Signed)
 Was calling to give the patient information to call [employer] at [phone] to receive a out of pocket cost estimate, patient assistance program or to set up 12 month interest free payment plan.  Voice mail not set up to leave a message. My chart message with payment information also sent to patient.

## 2023-05-08 ENCOUNTER — Other Ambulatory Visit: Payer: Self-pay

## 2023-05-08 DIAGNOSIS — R002 Palpitations: Secondary | ICD-10-CM

## 2023-05-08 DIAGNOSIS — Q251 Coarctation of aorta: Secondary | ICD-10-CM | POA: Diagnosis not present

## 2023-05-08 MED ORDER — METOPROLOL SUCCINATE ER 50 MG PO TB24: 50.0000 mg | ORAL_TABLET | Freq: Two times a day (BID) | ORAL | 3 refills | Status: DC

## 2023-05-08 NOTE — Progress Notes (Signed)
 BP is a little high  Heart rate in 90s when seen I have reviewed EKG  COnduction intervals includiing QT interval are OK  REcomm  1  I would recomm increasing metoprolol  XL to 50 mg bid    2  PLease let us  know when any changes are made in psych meds    Will need to follow up with EKGs as these drug classes can effect EKG

## 2023-05-12 ENCOUNTER — Other Ambulatory Visit (HOSPITAL_COMMUNITY): Payer: Self-pay | Admitting: Psychiatry

## 2023-05-12 DIAGNOSIS — F900 Attention-deficit hyperactivity disorder, predominantly inattentive type: Secondary | ICD-10-CM

## 2023-05-19 NOTE — Telephone Encounter (Signed)
Agree for er to cut back on metoprolol and follow

## 2023-05-22 ENCOUNTER — Encounter: Payer: Self-pay | Admitting: Gastroenterology

## 2023-05-22 ENCOUNTER — Other Ambulatory Visit (INDEPENDENT_AMBULATORY_CARE_PROVIDER_SITE_OTHER): Payer: Commercial Managed Care - HMO

## 2023-05-22 ENCOUNTER — Ambulatory Visit: Payer: Commercial Managed Care - HMO | Admitting: Gastroenterology

## 2023-05-22 VITALS — BP 110/78 | HR 84 | Ht 65.0 in | Wt 200.2 lb

## 2023-05-22 DIAGNOSIS — K7689 Other specified diseases of liver: Secondary | ICD-10-CM | POA: Diagnosis not present

## 2023-05-22 DIAGNOSIS — K2100 Gastro-esophageal reflux disease with esophagitis, without bleeding: Secondary | ICD-10-CM | POA: Diagnosis not present

## 2023-05-22 DIAGNOSIS — R748 Abnormal levels of other serum enzymes: Secondary | ICD-10-CM | POA: Diagnosis not present

## 2023-05-22 LAB — HEPATIC FUNCTION PANEL
ALT: 17 U/L (ref 0–35)
AST: 16 U/L (ref 0–37)
Albumin: 4.3 g/dL (ref 3.5–5.2)
Alkaline Phosphatase: 82 U/L (ref 39–117)
Bilirubin, Direct: 0.1 mg/dL (ref 0.0–0.3)
Total Bilirubin: 0.4 mg/dL (ref 0.2–1.2)
Total Protein: 6.9 g/dL (ref 6.0–8.3)

## 2023-05-22 NOTE — Progress Notes (Signed)
Discussed the use of AI scribe software for clinical note transcription with the patient, who gave verbal consent to proceed.  HPI : Kristin Stephens is a 38 y.o. female with a history of atrial septal defect, ventricular septal coarctation of the aorta status postrepair in 1989, bipolar disorder and hypertension who is referred to [place] by Geoffry Paradise, MD for further evaluation of nodular liver hyperplasia, and recent history of acute nausea, vomiting and epigastric pain.  She had presented to the ER in September with several days of symptoms of nausea, vomiting and epigastric pain.  She also reported having black-colored emesis concerning for bleeding.  A CT there showed no acute findings and benign focal nodular hyperplasia/stable hypervascular mass with central scar in the anterior liver dome measuring 6.6 cm, stable from previous imaging. Her liver enzymes were normal in the ER, but were checked again few days later and were elevated in a hepatocellular pattern, ALT predominant, but also with elevated bilirubin (alk phos normal).  Unknown if hyperbilirubinemia direct versus indirect.  She has not had liver enzymes checked since September. The patient was started on pantoprazole, and this resolved her symptoms.  She had been taking omeprazole for a long time, including while she was having her symptoms. She has been taking pantoprazole daily, and has not had any abdominal pain, heartburn, nausea/vomiting in several months.  She has a long history of GERD, and had previously been followed by Dr. Darrick Penna in [place].  She was last seen by her in 2020, at which time her Dexilant was switched to omeprazole and famotidine.  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  Recent CT scan demonstrated this was stable in size.      EGD 2008 Indication: Chest pain Normal  EGD January 2010 Normal esophagus, mild antral erythema,  normal duodenum   1. DUODENUM, BIOPSY: BENIGN SMALL BOWEL MUCOSA. NO VILLOUS   ATROPHY, INFLAMMATION OR OTHER ABNORMALITIES PRESENT.    2. STOMACH, BIOPSY: MILD CHRONIC GASTRITIS, NO EVIDENCE OF   HELICOBACTER PYLORI, INTESTINAL METAPLASIA, DYSPLASIA OR   MALIGNANCY.   Colonoscopy February 2015 (Dr. Darrick Penna) Redundant colon, but otherwise normal Large internal hemorrhoids, 3 bands placed   CT abdomen/pelvis September 2024  indication: Epigastric pain, nausea/vomiting FINDINGS: Lower Chest: No acute findings.   Hepatobiliary: Stable hypervascular mass with central scar in the anterior liver dome measuring approximately 6.6 cm, consistent with benign focal nodular hyperplasia. Gallbladder is unremarkable. No evidence of biliary ductal dilatation.   Pancreas:  No mass or inflammatory changes.   Spleen: Within normal limits in size and appearance.   Adrenals/Urinary Tract: No suspicious masses identified. No evidence of ureteral calculi or hydronephrosis.   Stomach/Bowel: No evidence of obstruction, inflammatory process or abnormal fluid collections. Normal appendix visualized.   Vascular/Lymphatic: No pathologically enlarged lymph nodes. No acute vascular findings.   Reproductive:  No mass or other significant abnormality.   Other:  None.   Musculoskeletal:  No suspicious bone lesions identified.   IMPRESSION: No acute findings within the abdomen or pelvis.   Stable benign focal nodular hyperplasia in the liver dome   MRI liver 2013 IMPRESSION:  7.0 cm enhancing lesion in the central hepatic dome, corresponding  to the sonographic abnormality, with imaging features  characteristic of focal nodular hyperplasia (FNH)     Component Ref Range & Units (hover) 5 mo ago (12/07/22) 5 mo ago (12/03/22) 3 yr ago (03/05/20) 3 yr  ago (02/29/20) 3 yr ago (02/29/20) 4 yr ago (12/26/18) 5 yr ago (10/20/17)  Sodium 136 141 135 140 138 137 140  Potassium 3.1 Low  2.9 Low  3.3 Low   3.5 3.5 4.1 3.8  Chloride 100 103 98 106 105 103 105  CO2 24 25 19  Low  20 Low  24 26 27   Glucose, Bld 99 118 High  CM 121 High  CM 122 High  CM 112 High  CM 129 High  91  Comment: Glucose reference range applies only to samples taken after fasting for at least 8 hours.  BUN 11 6 10 13 9 11 8   Creatinine, Ser 0.69 0.72 0.79 0.81 0.80 0.92 0.76  Calcium 9.6 9.8 9.9 9.2 9.4 9.3 9.3  Total Protein 7.3 7.7 7.0 7.2 7.2 7.1   Albumin 4.7 4.9 3.9 4.3 4.2 4.2   AST 42 High  17 53 High  20 17 19    ALT 81 High  17 102 High  23 22 27    Alkaline Phosphatase 85 72 78 66 66 69   Total Bilirubin 2.7 High  0.7 2.6 High  0.9 0.8 0.6    Past Medical History:  Diagnosis Date   Abnormal Pap smear of cervix    age 61 with hx of cryotherapy   ADHD (attention deficit hyperactivity disorder)    Anxiety    Asthma    daily and prn inhalers   Bipolar affective disorder (HCC)    COVID 05/2020   Dental crowns present    Depression    Deviated nasal septum 05/2012   Eczema    right leg   Esophageal reflux    Fibromyalgia    Focal nodular hyperplasia of liver    Heart murmur    Hypertension    IBS (irritable bowel syndrome)    Migraine headache    with aura   Nasal turbinate hypertrophy 05/2012   bilateral   OCD (obsessive compulsive disorder)    STD (sexually transmitted disease)    Hx of abnormal pap--age 34    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      Past Surgical History:  Procedure Laterality Date   ANKLE RECONSTRUCTION Left 08/14/2021   Procedure: RECONSTRUCTION ANKLE Open debridement of left peroneal tendons;  Surgeon: Netta Cedars, MD;  Location: [facility];  Service: Orthopedics;  Laterality: Left;   ASD AND VSD REPAIR  1989   CARDIAC CATHETERIZATION  06/18/2006   CESAREAN SECTION  04/12/2009   COARCTATION OF AORTA REPAIR  1989   COLONOSCOPY N/A 05/20/2013   Procedure: COLONOSCOPY;  Surgeon: West Bali, MD;  Location: [facility];   Service: Endoscopy;  Laterality: N/A;  115-moved to 1230 Leigh Ann notified pt   ESOPHAGOGASTRODUODENOSCOPY  04/04/2008     Normal esophagus/Mild patchy erythema in the antrum/ Normal duodenal bulb, normal small bowel biopsy   FLEXIBLE SIGMOIDOSCOPY  04/04/2008     Small internal hemorrhoids (poor bowel prep)   HEMORRHOID BANDING N/A 05/20/2013   Procedure: HEMORRHOID BANDING;  Surgeon: West Bali, MD;  Location: [facility];  Service: Endoscopy;  Laterality: N/A;   NASAL SEPTOPLASTY W/ TURBINOPLASTY Bilateral 05/31/2012   Procedure: NASAL SEPTOPLASTY WITH TURBINATE REDUCTION;  Surgeon: Darletta Moll, MD;  Location: [facility];  Service: ENT;  Laterality: Bilateral;   Family History  Problem Relation Age of Onset   Cancer Mother        leukemia  Hyperlipidemia Mother    Hypertension Mother    Depression Mother    Physical abuse Mother    Sexual abuse Mother        possibly   Sleep apnea Mother    Stroke Mother    Personality disorder Father    Bipolar disorder Father    Alcohol abuse Father    ADD / ADHD Sister    Anxiety disorder Brother    ADD / ADHD Brother    Heart disease Maternal Grandmother    Dementia Maternal Grandmother    Stroke Maternal Grandfather    Prostate cancer Maternal Grandfather    Thyroid disease Maternal Grandfather    Emphysema Paternal Grandmother    Heart disease Paternal Grandmother    Bipolar disorder Paternal Grandmother    Dementia Paternal Grandmother    Seizures Daughter    Drug abuse Neg Hx    OCD Neg Hx    Paranoid behavior Neg Hx    Schizophrenia Neg Hx    Social History   Tobacco Use   Smoking status: Every Day   Smokeless tobacco: Current   Tobacco comments:    vape  Vaping Use   Vaping status: Every Day  Substance Use Topics   Alcohol use: No    Comment: recovering alcoholic 8 mos sober as of 04/15/17   Drug use: No   Current Outpatient Medications  Medication Sig Dispense Refill   acetaminophen (TYLENOL)  500 MG tablet Take 1,000-2,000 mg by mouth every 6 (six) hours as needed for mild pain, moderate pain, fever or headache.     ARIPiprazole (ABILIFY) 20 MG tablet Take 1 tablet (20 mg total) by mouth daily. 30 tablet 2   diclofenac Sodium (VOLTAREN) 1 % GEL Apply topically 4 (four) times daily.     FLUoxetine (PROZAC) 40 MG capsule Take 1 capsule (40 mg total) by mouth daily. 90 capsule 0   fluticasone (FLONASE) 50 MCG/ACT nasal spray USE 2 SPRAYS IN EACH NOSTRIL EVERY DAY 16 g 5   gabapentin (NEURONTIN) 100 MG capsule 1 capsule Orally three times a day for 30 days As needed for pain, caution sedation     hydrOXYzine (ATARAX) 25 MG tablet Take 1 tablet (25 mg total) by mouth daily as needed for anxiety. 90 tablet 0   ibuprofen (ADVIL) 200 MG tablet Take 200 mg by mouth every 6 (six) hours as needed for mild pain or headache.     lamoTRIgine (LAMICTAL) 100 MG tablet Take 0.5-1 tablets (50-100 mg total) by mouth at bedtime. 90 tablet 0   lisdexamfetamine (VYVANSE) 40 MG capsule Take 1 capsule (40 mg total) by mouth daily. 30 capsule 0   loratadine (CLARITIN) 10 MG tablet Take 1 tablet by mouth daily as needed for allergies.     medroxyPROGESTERone (DEPO-PROVERA) 150 MG/ML injection Inject 150 mg into the muscle every 3 (three) months.     metoprolol succinate (TOPROL-XL) 50 MG 24 hr tablet Take 1 tablet (50 mg total) by mouth 2 (two) times daily. 180 tablet 3   ondansetron (ZOFRAN-ODT) 8 MG disintegrating tablet Take 1 tablet (8 mg total) by mouth every 8 (eight) hours as needed for nausea. 20 tablet 0   pantoprazole (PROTONIX) 40 MG tablet Take 1 tablet (40 mg total) by mouth daily. 30 tablet 0   No current facility-administered medications for this visit.   Allergies  Allergen Reactions   Sulfonamide Derivatives Swelling    SWELLING OF EYES WITH OPHTHALMIC SULFA   Coconut (Cocos Nucifera)  Rash and Other (See Comments)   Coconut Flavoring Agent (Non-Screening) Rash     Review of  Systems: All systems reviewed and negative except where noted in HPI.    No results found.  Physical Exam: BP 110/78   Pulse 84   Ht 5\' 5"  (1.651 m)   Wt 200 lb 4 oz (90.8 kg)   BMI 33.32 kg/m  Constitutional: Pleasant,well-developed, Caucasian female in no acute distress. HEENT: Normocephalic and atraumatic. Conjunctivae are normal. No scleral icterus. Neck supple.  Cardiovascular: Normal rate, regular rhythm.  Pulmonary/chest: Effort normal and breath sounds normal. No wheezing, rales or rhonchi. Abdominal: Soft, nondistended, nontender. Bowel sounds active throughout. There are no masses palpable. No hepatomegaly. Extremities: no edema Lymphadenopathy: No cervical adenopathy noted. Neurological: Alert and oriented to person place and time. Skin: Skin is warm and dry. No rashes noted. Psychiatric: Normal mood and affect. Behavior is normal.  CBC    Component Value Date/Time   WBC 10.5 12/07/2022 0826   RBC 5.59 (H) 12/07/2022 0826   HGB 16.5 (H) 12/07/2022 0826   HCT 47.0 (H) 12/07/2022 0826   HCT 38 11/19/2011 1518   PLT 384 12/07/2022 0826   MCV 84.1 12/07/2022 0826   MCH 29.5 12/07/2022 0826   MCHC 35.1 12/07/2022 0826   RDW 12.7 12/07/2022 0826   LYMPHSABS 1.4 12/07/2022 0826   MONOABS 1.0 12/07/2022 0826   EOSABS 0.0 12/07/2022 0826   BASOSABS 0.1 12/07/2022 0826    CMP     Component Value Date/Time   NA 136 12/07/2022 0826   NA 139 10/22/2011 1519   K 3.1 (L) 12/07/2022 0826   K 4.2 10/22/2011 1519   CL 100 12/07/2022 0826   CL 103 10/22/2011 1519   CO2 24 12/07/2022 0826   CO2 28 10/22/2011 1519   GLUCOSE 99 12/07/2022 0826   BUN 11 12/07/2022 0826   BUN 12 10/22/2011 1519   CREATININE 0.69 12/07/2022 0826   CREATININE 0.93 01/18/2016 1005   CALCIUM 9.6 12/07/2022 0826   CALCIUM 9.6 10/22/2011 1519   PROT 7.3 12/07/2022 0826   ALBUMIN 4.7 12/07/2022 0826   AST 42 (H) 12/07/2022 0826   AST 15 10/22/2011 1519   ALT 81 (H) 12/07/2022 0826    ALKPHOS 85 12/07/2022 0826   ALKPHOS 81 10/22/2011 1519   BILITOT 2.7 (H) 12/07/2022 0826   BILITOT 0.3 10/22/2011 1519   GFRNONAA >60 12/07/2022 0826   GFRAA >60 12/26/2018 1156       Latest Ref Rng & Units 12/07/2022    8:26 AM 12/03/2022   10:41 AM 03/05/2020    5:48 AM  CBC EXTENDED  WBC 4.0 - 10.5 K/uL 10.5  12.4  9.5   RBC 3.87 - 5.11 MIL/uL 5.59  4.70  5.52   Hemoglobin 12.0 - 15.0 g/dL 16.1  09.6  04.5   HCT 36.0 - 46.0 % 47.0  40.4  50.8   Platelets 150 - 400 K/uL 384  376  403   NEUT# 1.7 - 7.7 K/uL 7.8     Lymph# 0.7 - 4.0 K/uL 1.4         ASSESSMENT AND PLAN:  Assessment and Plan    Gastroesophageal Reflux Disease (GERD) Severe vomiting and acid reflux in September led to esophagitis and possible ulceration. Symptoms included vomiting bile and blood, poor p.o. intake and weight loss. Protonix (pantoprazole) has been effective, with complete resolution of symptoms. Discussed chronic nature of GERD and long-term management. Potential risks of long-term Protonix  use include decreased bone density. Discussed alternative famotidine with fewer long-term safety concerns. Explained need to taper off Protonix to avoid rebound hyperacidity.  Recommend considering weaning off pantoprazole when symptoms have been controlled for long period of time, and patient does not have any other significant medical issues. - Continue Protonix (pantoprazole) once nightly - Provide handout on reflux dietary guidance - Consider tapering off Protonix if symptoms remain controlled - Discuss potential switch to famotidine if concerned about long-term Protonix use  Elevated Liver Enzymes Fluctuating liver enzyme levels, last check in September showed elevation. Known focal nodular hyperplasia (FNH) for 12 years, unlikely contributing to elevated liver enzymes. No alcohol consumption for five years.  - Order hepatic panel to recheck liver enzymes - If elevated, consider additional tests to rule out  other causes of chronic liver disease  Focal nodular hyperplasia Stable for at least 12 years, asymptomatic.   - No need for surveillance  Cary Lothrop E. Tomasa Rand, MD [facility]     Geoffry Paradise, MD

## 2023-05-22 NOTE — Patient Instructions (Addendum)
Your provider has requested that you go to the basement level for lab work before leaving today. Press "B" on the elevator. The lab is located at the first door on the left as you exit the elevator.  _______________________________________________________  If your blood pressure at your visit was 140/90 or greater, please contact your primary care physician to follow up on this.  _______________________________________________________  If you are age 36 or older, your body mass index should be between 23-30. Your Body mass index is 33.32 kg/m. If this is out of the aforementioned range listed, please consider follow up with your Primary Care Provider.  If you are age 6 or younger, your body mass index should be between 19-25. Your Body mass index is 33.32 kg/m. If this is out of the aformentioned range listed, please consider follow up with your Primary Care Provider.   ________________________________________________________  The [facility] providers would like to encourage you to use [facility] to communicate with providers for non-urgent requests or questions.  Due to long hold times on the telephone, sending your provider a message by [facility] may be a faster and more efficient way to get a response.  Please allow 48 business hours for a response.  Please remember that this is for non-urgent requests.  _______________________________________________________    It was a pleasure to see you today!  Thank you for trusting me with your gastrointestinal care!    Scott E.Tomasa Rand, MD

## 2023-05-30 ENCOUNTER — Encounter: Payer: Self-pay | Admitting: Gastroenterology

## 2023-05-30 NOTE — Progress Notes (Signed)
 Kristin Stephens,  Your liver enzymes have normalized.  No further work up for chronic liver disease is needed.

## 2023-06-01 ENCOUNTER — Other Ambulatory Visit (HOSPITAL_COMMUNITY): Payer: Self-pay | Admitting: Psychiatry

## 2023-06-01 DIAGNOSIS — F900 Attention-deficit hyperactivity disorder, predominantly inattentive type: Secondary | ICD-10-CM

## 2023-06-03 ENCOUNTER — Telehealth (HOSPITAL_COMMUNITY): Payer: Self-pay

## 2023-06-03 DIAGNOSIS — F900 Attention-deficit hyperactivity disorder, predominantly inattentive type: Secondary | ICD-10-CM

## 2023-06-03 MED ORDER — LISDEXAMFETAMINE DIMESYLATE 40 MG PO CAPS: 40.0000 mg | ORAL_CAPSULE | Freq: Every day | ORAL | 0 refills | Status: DC

## 2023-06-03 NOTE — Addendum Note (Signed)
 Addended by: Kathryne Sharper T on: 06/03/2023 12:24 PM   Modules accepted: Orders

## 2023-06-03 NOTE — Telephone Encounter (Signed)
 Done

## 2023-06-03 NOTE — Telephone Encounter (Signed)
 Patient calling for a refill on her Vyvanse, alst filled 1/30. Patient has a follow up in May, please review and advise, thank you

## 2023-06-04 ENCOUNTER — Ambulatory Visit: Payer: Commercial Managed Care - HMO

## 2023-06-05 ENCOUNTER — Ambulatory Visit: Payer: Commercial Managed Care - HMO

## 2023-06-08 MED ORDER — METOPROLOL SUCCINATE ER 50 MG PO TB24: ORAL_TABLET | ORAL | Status: DC

## 2023-06-08 NOTE — Addendum Note (Signed)
 Addended by: Bertram Millard on: 06/08/2023 05:41 PM   Modules accepted: Orders

## 2023-06-08 NOTE — Telephone Encounter (Signed)
 Pt says that she does feel the SVT and mostly the skips when she is laying down at night.. she will try the increase in the metoprolol and let us know how she is doing.

## 2023-06-09 ENCOUNTER — Ambulatory Visit (HOSPITAL_COMMUNITY)
Admission: RE | Admit: 2023-06-09 | Discharge: 2023-06-09 | Disposition: A | Discharge status: Home / Self Care | Source: Ambulatory Visit | Attending: Orthopaedic Surgery | Admitting: Orthopaedic Surgery

## 2023-06-09 ENCOUNTER — Other Ambulatory Visit: Payer: Commercial Managed Care - HMO

## 2023-06-09 ENCOUNTER — Telehealth: Payer: Self-pay | Admitting: *Deleted

## 2023-06-09 ENCOUNTER — Other Ambulatory Visit (HOSPITAL_COMMUNITY): Payer: Self-pay | Admitting: Orthopaedic Surgery

## 2023-06-09 ENCOUNTER — Encounter: Payer: Self-pay | Admitting: Obstetrics and Gynecology

## 2023-06-09 DIAGNOSIS — M25571 Pain in right ankle and joints of right foot: Secondary | ICD-10-CM

## 2023-06-09 NOTE — Telephone Encounter (Signed)
 Call transferred from front office. Spoke with patient. Recent ankle injury, requires MRI and possibly surgery. Patient request to cancel PUS scheduled for today and possibly hold off on surgery for now. Patient reports she is not currently experiencing any irregular menses or bleeding.   PUS canceled for 06/09/23. Will keep OV with depo as scheduled for 3/13 at 0830 to further discuss plan with Dr. Karma Greaser.   Routing to provider for final review. Patient is agreeable to disposition. Will close encounter.

## 2023-06-11 ENCOUNTER — Ambulatory Visit (INDEPENDENT_AMBULATORY_CARE_PROVIDER_SITE_OTHER): Payer: Commercial Managed Care - HMO

## 2023-06-11 DIAGNOSIS — Z3042 Encounter for surveillance of injectable contraceptive: Secondary | ICD-10-CM

## 2023-06-11 MED ORDER — MEDROXYPROGESTERONE ACETATE 150 MG/ML IM SUSY
150.0000 mg | PREFILLED_SYRINGE | Freq: Once | INTRAMUSCULAR | Status: AC
  Administered 2023-06-11: 150 mg via INTRAMUSCULAR

## 2023-06-29 ENCOUNTER — Other Ambulatory Visit (HOSPITAL_COMMUNITY): Payer: Self-pay | Admitting: Psychiatry

## 2023-06-29 DIAGNOSIS — F900 Attention-deficit hyperactivity disorder, predominantly inattentive type: Secondary | ICD-10-CM

## 2023-06-30 ENCOUNTER — Telehealth (HOSPITAL_COMMUNITY): Payer: Self-pay | Admitting: *Deleted

## 2023-06-30 DIAGNOSIS — F900 Attention-deficit hyperactivity disorder, predominantly inattentive type: Secondary | ICD-10-CM

## 2023-06-30 MED ORDER — LISDEXAMFETAMINE DIMESYLATE 40 MG PO CAPS: 40.0000 mg | ORAL_CAPSULE | Freq: Every day | ORAL | 0 refills | Status: DC

## 2023-06-30 NOTE — Telephone Encounter (Signed)
 Done

## 2023-06-30 NOTE — Telephone Encounter (Signed)
 Pt called requesting a refill of the Vyvanse 40 mg. Last visit 04/30/23 and f/u scheduled for 07/30/23. Please review.

## 2023-07-07 ENCOUNTER — Other Ambulatory Visit (HOSPITAL_COMMUNITY): Payer: Self-pay | Admitting: Psychiatry

## 2023-07-07 DIAGNOSIS — F419 Anxiety disorder, unspecified: Secondary | ICD-10-CM

## 2023-07-07 DIAGNOSIS — F3131 Bipolar disorder, current episode depressed, mild: Secondary | ICD-10-CM

## 2023-07-27 ENCOUNTER — Other Ambulatory Visit (HOSPITAL_COMMUNITY): Payer: Self-pay | Admitting: Psychiatry

## 2023-07-27 DIAGNOSIS — F900 Attention-deficit hyperactivity disorder, predominantly inattentive type: Secondary | ICD-10-CM

## 2023-07-30 ENCOUNTER — Ambulatory Visit (HOSPITAL_COMMUNITY): Payer: Commercial Managed Care - HMO | Admitting: Psychiatry

## 2023-07-31 ENCOUNTER — Telehealth (HOSPITAL_COMMUNITY): Admitting: Psychiatry

## 2023-08-03 ENCOUNTER — Other Ambulatory Visit (HOSPITAL_COMMUNITY): Payer: Self-pay

## 2023-08-03 ENCOUNTER — Telehealth (HOSPITAL_COMMUNITY): Payer: Self-pay

## 2023-08-03 DIAGNOSIS — F900 Attention-deficit hyperactivity disorder, predominantly inattentive type: Secondary | ICD-10-CM

## 2023-08-03 DIAGNOSIS — F419 Anxiety disorder, unspecified: Secondary | ICD-10-CM

## 2023-08-03 DIAGNOSIS — F3131 Bipolar disorder, current episode depressed, mild: Secondary | ICD-10-CM

## 2023-08-03 MED ORDER — ARIPIPRAZOLE 20 MG PO TABS: 20.0000 mg | ORAL_TABLET | Freq: Every day | ORAL | 0 refills | Status: DC

## 2023-08-03 MED ORDER — FLUOXETINE HCL 40 MG PO CAPS: 40.0000 mg | ORAL_CAPSULE | Freq: Every day | ORAL | 0 refills | Status: DC

## 2023-08-03 MED ORDER — HYDROXYZINE HCL 25 MG PO TABS: 25.0000 mg | ORAL_TABLET | Freq: Every day | ORAL | 0 refills | Status: DC | PRN

## 2023-08-03 MED ORDER — LISDEXAMFETAMINE DIMESYLATE 40 MG PO CAPS: 40.0000 mg | ORAL_CAPSULE | Freq: Every day | ORAL | 0 refills | Status: DC

## 2023-08-03 MED ORDER — LAMOTRIGINE 100 MG PO TABS: 50.0000 mg | ORAL_TABLET | Freq: Every day | ORAL | 0 refills | Status: DC

## 2023-08-03 NOTE — Telephone Encounter (Signed)
 Patients appointment was canceled and rescheduled to 10/01/23. I have sent refills for all of her medication except the Vyvanse . Please send to [facility], thank you

## 2023-08-03 NOTE — Telephone Encounter (Signed)
 I sent her prescription and she can pick up on her due date.

## 2023-08-12 ENCOUNTER — Other Ambulatory Visit (HOSPITAL_COMMUNITY): Payer: Self-pay | Admitting: Psychiatry

## 2023-08-12 DIAGNOSIS — F900 Attention-deficit hyperactivity disorder, predominantly inattentive type: Secondary | ICD-10-CM

## 2023-08-17 ENCOUNTER — Ambulatory Visit: Admitting: Obstetrics and Gynecology

## 2023-08-27 ENCOUNTER — Ambulatory Visit (INDEPENDENT_AMBULATORY_CARE_PROVIDER_SITE_OTHER): Admitting: *Deleted

## 2023-08-27 DIAGNOSIS — Z3042 Encounter for surveillance of injectable contraceptive: Secondary | ICD-10-CM

## 2023-08-27 MED ORDER — MEDROXYPROGESTERONE ACETATE 150 MG/ML IM SUSY
150.0000 mg | PREFILLED_SYRINGE | Freq: Once | INTRAMUSCULAR | Status: AC
  Administered 2023-08-27: 150 mg via INTRAMUSCULAR

## 2023-09-01 ENCOUNTER — Telehealth (HOSPITAL_COMMUNITY): Payer: Self-pay

## 2023-09-01 DIAGNOSIS — F900 Attention-deficit hyperactivity disorder, predominantly inattentive type: Secondary | ICD-10-CM

## 2023-09-01 MED ORDER — LISDEXAMFETAMINE DIMESYLATE 40 MG PO CAPS
40.0000 mg | ORAL_CAPSULE | Freq: Every day | ORAL | 0 refills | Status: DC
Start: 2023-09-01 — End: 2023-10-01

## 2023-09-01 NOTE — Telephone Encounter (Signed)
 Called patient and she will come in for her U tox next Wednesday

## 2023-09-01 NOTE — Telephone Encounter (Signed)
 I sent her prescription to her pharmacy.  She can pick up on her due date.  We also need urine toxicology.  Please schedule for U tox.

## 2023-09-01 NOTE — Telephone Encounter (Signed)
 Patients pharmacy sent fax to request patients Vyvanse  last sent on 08/03/2023. Patient has a follow up on 10/01/2023. Pharmacy is [facility]. Please review and advise, thank you

## 2023-09-09 ENCOUNTER — Ambulatory Visit (HOSPITAL_COMMUNITY)

## 2023-09-09 DIAGNOSIS — F900 Attention-deficit hyperactivity disorder, predominantly inattentive type: Secondary | ICD-10-CM

## 2023-09-09 NOTE — Progress Notes (Signed)
 Patient came in today for her due urine drug screen. Patient presents well groomed with an appropriate affect. Patient had no questions and provided a sample without complication. Rapid screen was negative for all substances  except amphetamine , patient takes Vyvanse , so this is appropriate.

## 2023-09-28 ENCOUNTER — Other Ambulatory Visit (HOSPITAL_COMMUNITY): Payer: Self-pay | Admitting: Psychiatry

## 2023-09-28 DIAGNOSIS — F900 Attention-deficit hyperactivity disorder, predominantly inattentive type: Secondary | ICD-10-CM

## 2023-10-01 ENCOUNTER — Other Ambulatory Visit: Payer: Self-pay

## 2023-10-01 ENCOUNTER — Ambulatory Visit (HOSPITAL_COMMUNITY): Admitting: Psychiatry

## 2023-10-01 ENCOUNTER — Encounter (HOSPITAL_COMMUNITY): Payer: Self-pay | Admitting: Psychiatry

## 2023-10-01 ENCOUNTER — Telehealth (HOSPITAL_COMMUNITY): Admitting: Psychiatry

## 2023-10-01 VITALS — BP 140/86 | HR 98 | Ht 65.0 in | Wt 222.0 lb

## 2023-10-01 DIAGNOSIS — F3131 Bipolar disorder, current episode depressed, mild: Secondary | ICD-10-CM | POA: Diagnosis not present

## 2023-10-01 DIAGNOSIS — F900 Attention-deficit hyperactivity disorder, predominantly inattentive type: Secondary | ICD-10-CM

## 2023-10-01 DIAGNOSIS — F419 Anxiety disorder, unspecified: Secondary | ICD-10-CM | POA: Diagnosis not present

## 2023-10-01 MED ORDER — LAMOTRIGINE 100 MG PO TABS: 100.0000 mg | ORAL_TABLET | Freq: Every day | ORAL | 0 refills | Status: DC

## 2023-10-01 MED ORDER — LISDEXAMFETAMINE DIMESYLATE 40 MG PO CAPS: 40.0000 mg | ORAL_CAPSULE | Freq: Every day | ORAL | 0 refills | Status: DC

## 2023-10-01 MED ORDER — ARIPIPRAZOLE 15 MG PO TABS: 15.0000 mg | ORAL_TABLET | Freq: Every day | ORAL | 0 refills | Status: DC

## 2023-10-01 NOTE — Progress Notes (Signed)
 [facility] MD/PA/NP OP Progress Note  10/01/2023 10:08 AM Kristin Stephens  MRN:  [id number]  Chief Complaint:  Chief Complaint  Patient presents with   Follow-up   Medication Refill   HPI: Patient came today for her follow-up appointment in the office.  She was last seen in January.  She admitted significant weight gain and she is very concerned about it.  She also admitted not following healthy diet other than walk she does not do any exercise.  She is taking Abilify  20 mg along with Lamictal  100 mg.  She tried to cut down 50 but she noticed symptoms are coming back.  She reported 1 episode of crying when she was watching movie and thinking about her daughter.  She is excited as her 68 year old daughter coming next week to stay with her.  She also changed her job and now working for a different company in person and her stress level is not as bad.  She reported relationship with the boyfriend is going well.  She is no longer taking trazodone .  She occasionally takes hydroxyzine  and does not require a new prescription.  She is consistent with Prozac  40 mg, Abilify  and Vyvanse .  Recently saw cardiologist and gastroenterologist.  Patient has VSD and had a EKG.  She was concerned about skipping heartbeats.  She had a Holter and she was told results were okay.  However she was recommended further test but due to insurance does not cover she is waiting to get it done.  She denies any dizziness, fall, syncopal episode.  Her recent labs are better as initially high liver enzymes.  She noticed overall mood stable.  She denies any mania, psychosis.  She denies any suicidal thoughts.  Her attention concentration is better on Vyvanse  and she is able to function.  She tried to stop the medication and could not function.  Visit Diagnosis:    ICD-10-CM   1. Bipolar affective disorder, currently depressed, mild (HCC)  F31.31 ARIPiprazole  (ABILIFY ) 15 MG tablet    lamoTRIgine  (LAMICTAL ) 100 MG tablet    2. Anxiety  F41.9      3. Attention deficit hyperactivity disorder (ADHD), predominantly inattentive type  F90.0 lisdexamfetamine (VYVANSE ) 40 MG capsule         Past Psychiatric History:  H/O mania, depression, anger, overdose and slashing her wrist.  Did IOP in 2016. Inpatient in October 2018. H/O ETOH. Took Adderall, Ritalin , Ativan , Gabapentin , Wellbutrin but limited response.      Past Medical History:  Past Medical History:  Diagnosis Date   Abnormal Pap smear of cervix    age 35 with hx of cryotherapy   ADHD (attention deficit hyperactivity disorder)    Anxiety    Asthma    daily and prn inhalers   Bipolar affective disorder (HCC)    COVID 05/2020   Dental crowns present    Depression    Deviated nasal septum 05/2012   Eczema    right leg   Esophageal reflux    Fibromyalgia    Focal nodular hyperplasia of liver    Heart murmur    Hypertension    IBS (irritable bowel syndrome)    Migraine headache    with aura   Nasal turbinate hypertrophy 05/2012   bilateral   OCD (obsessive compulsive disorder)    STD (sexually transmitted disease)    Hx of abnormal pap--age 59    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  Past Surgical History:  Procedure Laterality Date   ANKLE RECONSTRUCTION Left 08/14/2021   Procedure: RECONSTRUCTION ANKLE Open debridement of left peroneal tendons;  Surgeon: Barton Drape, MD;  Location: [facility];  Service: Orthopedics;  Laterality: Left;   ASD AND VSD REPAIR  1989   CARDIAC CATHETERIZATION  06/18/2006   CESAREAN SECTION  04/12/2009   COARCTATION OF AORTA REPAIR  1989   COLONOSCOPY N/A 05/20/2013   Procedure: COLONOSCOPY;  Surgeon: Margo LITTIE Haddock, MD;  [facility];  Service: Endoscopy;  Laterality: N/A;  115-moved to 1230 Leigh Ann notified pt   ESOPHAGOGASTRODUODENOSCOPY  04/04/2008     Normal esophagus/Mild patchy erythema in the antrum/ Normal duodenal bulb, normal small bowel biopsy   FLEXIBLE  SIGMOIDOSCOPY  04/04/2008     Small internal hemorrhoids (poor bowel prep)   HEMORRHOID BANDING N/A 05/20/2013   Procedure: HEMORRHOID BANDING;  Surgeon: Margo LITTIE Haddock, MD;  Location: [facility];  Service: Endoscopy;  Laterality: N/A;   NASAL SEPTOPLASTY W/ TURBINOPLASTY Bilateral 05/31/2012   Procedure: NASAL SEPTOPLASTY WITH TURBINATE REDUCTION;  Surgeon: Ana LELON Moccasin, MD;  Location: [facility];  Service: ENT;  Laterality: Bilateral;    Family Psychiatric History: Reviewed.  Family History:  Family History  Problem Relation Age of Onset   Cancer Mother        leukemia   Hyperlipidemia Mother    Hypertension Mother    Depression Mother    Physical abuse Mother    Sexual abuse Mother        possibly   Sleep apnea Mother    Stroke Mother    Personality disorder Father    Bipolar disorder Father    Alcohol abuse Father    ADD / ADHD Sister    Anxiety disorder Brother    ADD / ADHD Brother    Heart disease Maternal Grandmother    Dementia Maternal Grandmother    Stroke Maternal Grandfather    Prostate cancer Maternal Grandfather    Thyroid disease Maternal Grandfather    Emphysema Paternal Grandmother    Heart disease Paternal Grandmother    Bipolar disorder Paternal Grandmother    Dementia Paternal Grandmother    Seizures Daughter    Drug abuse Neg Hx    OCD Neg Hx    Paranoid behavior Neg Hx    Schizophrenia Neg Hx     Social History:  Social History   Socioeconomic History   Marital status: Media planner    Spouse name: Not on file   Number of children: 1   Years of education: GED   Highest education level: Not on file  Occupational History   Occupation: Librarian, academic  Tobacco Use   Smoking status: Every Day   Smokeless tobacco: Current   Tobacco comments:    vape  Vaping Use   Vaping status: Every Day  Substance and Sexual Activity   Alcohol use: No    Comment: recovering alcoholic 8 mos sober as of 04/15/17   Drug use: No   Sexual  activity: Yes    Partners: Male    Birth control/protection: Injection    Comment: Depo Provera   Other Topics Concern   Not on file  Social History Narrative      Social Drivers of Health   Financial Resource Strain: Not on file  Food Insecurity: Not on file  Transportation Needs: Not on file  Physical Activity: Not on file  Stress: Not on file  Social Connections: Not  on file    Allergies:  Allergies  Allergen Reactions   Sulfonamide Derivatives Swelling    SWELLING OF EYES WITH OPHTHALMIC SULFA   Coconut (Cocos Nucifera) Rash and Other (See Comments)   Coconut Flavoring Agent (Non-Screening) Rash    Metabolic Disorder Labs: Lab Results  Component Value Date   HGBA1C 5.0 07/17/2014   MPG 97 08/28/2011   No results found for: PROLACTIN Lab Results  Component Value Date   CHOL 175 12/30/2018   TRIG 84 12/30/2018   HDL 45 12/30/2018   CHOLHDL 3.9 12/30/2018   VLDL 14 02/06/2007   LDLCALC 114 (H) 12/30/2018   LDLCALC 108 (H) 06/04/2017   Lab Results  Component Value Date   TSH 1.299 03/31/2008   TSH 1.424 Test methodology is 3rd generation TSH 02/05/2007    Therapeutic Level Labs: No results found for: LITHIUM No results found for: VALPROATE No results found for: CBMZ  Current Medications: Current Outpatient Medications  Medication Sig Dispense Refill   acetaminophen  (TYLENOL ) 500 MG tablet Take 1,000-2,000 mg by mouth every 6 (six) hours as needed for mild pain, moderate pain, fever or headache.     ARIPiprazole  (ABILIFY ) 20 MG tablet Take 1 tablet (20 mg total) by mouth daily. 90 tablet 0   diclofenac  Sodium (VOLTAREN ) 1 % GEL Apply topically 4 (four) times daily.     FLUoxetine  (PROZAC ) 40 MG capsule Take 1 capsule (40 mg total) by mouth daily. 90 capsule 0   fluticasone  (FLONASE ) 50 MCG/ACT nasal spray USE 2 SPRAYS IN EACH NOSTRIL EVERY DAY 16 g 5   gabapentin  (NEURONTIN ) 100 MG capsule 1 capsule Orally three times a day for 30 days As needed  for pain, caution sedation     hydrOXYzine  (ATARAX ) 25 MG tablet Take 1 tablet (25 mg total) by mouth daily as needed for anxiety. 90 tablet 0   ibuprofen (ADVIL) 200 MG tablet Take 200 mg by mouth every 6 (six) hours as needed for mild pain or headache.     lamoTRIgine  (LAMICTAL ) 100 MG tablet Take 0.5-1 tablets (50-100 mg total) by mouth at bedtime. 90 tablet 0   lisdexamfetamine (VYVANSE ) 40 MG capsule Take 1 capsule (40 mg total) by mouth daily. 30 capsule 0   loratadine  (CLARITIN ) 10 MG tablet Take 1 tablet by mouth daily as needed for allergies.     medroxyPROGESTERone  (DEPO-PROVERA ) 150 MG/ML injection Inject 150 mg into the muscle every 3 (three) months.     metoprolol  succinate (TOPROL -XL) 50 MG 24 hr tablet Take 1 1/2 tablets (75 mg ) by mouth in the morning and 1 tablet (50 mg) in the evening.     ondansetron  (ZOFRAN -ODT) 8 MG disintegrating tablet Take 1 tablet (8 mg total) by mouth every 8 (eight) hours as needed for nausea. 20 tablet 0   pantoprazole  (PROTONIX ) 40 MG tablet Take 1 tablet (40 mg total) by mouth daily. 30 tablet 0   No current facility-administered medications for this visit.     Musculoskeletal: Strength & Muscle Tone: within normal limits Gait & Station: normal Patient leans: N/A Psychiatric Specialty Exam: Physical Exam  Review of Systems  Blood pressure (!) 140/86, pulse 98, height 5' 5 (1.651 m), weight 222 lb (100.7 kg).Body mass index is 36.94 kg/m.  General Appearance: Well Groomed  Eye Contact:  Good  Speech:  Normal Rate  Volume:  Normal  Mood:  Euthymic  Affect:  Appropriate  Thought Process:  Goal Directed  Orientation:  Full (Time, Place, and Person)  Thought Content:  WDL  Suicidal Thoughts:  No  Homicidal Thoughts:  No  Memory:  Immediate;   Good Recent;   Good Remote;   Good  Judgement:  Intact  Insight:  Present  Psychomotor Activity:  Normal  Concentration:  Concentration: Good and Attention Span: Good  Recall:  Good  Fund of  Knowledge:  Good  Language:  Good  Akathisia:  No  Handed:  Right  AIMS (if indicated):     Assets:  Communication Skills Desire for Improvement Housing Resilience Social Support Talents/Skills Transportation  ADL's:  Intact  Cognition:  WNL  Sleep:   Okay occasionally takes hydroxyzine      Screenings: AIMS    Flowsheet Row [facility]  AIMS Total Score 0   AUDIT    Flowsheet Row [facility]  Alcohol Use Disorder Identification Test Final Score (AUDIT) 0   Mini-Mental    Flowsheet Row Office Visit from 07/31/2016 in [facility]  Total Score (max 30 points ) 29   PHQ2-9    Flowsheet Row Office Visit from 03/02/2017 in [facility]  PHQ-2 Total Score 2  PHQ-9 Total Score 11   Flowsheet Row ED from 12/07/2022 in [facility] ED from 12/03/2022 in [facility] Emergency Department at [facility] Admission (Discharged) from 08/14/2021 in [facility]  C-SSRS RISK CATEGORY No Risk No Risk No Risk     Assessment and Plan: Patient is 38 year old Caucasian female with a history of migraine headaches, hypertension, ASVD, VSD, coarctation of aorta, history of high liver enzymes, GERD, IBS recently seen by cardiologist and gastroenterologist.  I reviewed blood work results, collateral information and current medication.  She is no longer taking trazodone .  She is very concerned about weight gain.  Given the history of heart disease recommend weight loss program.  Patient told that she is not able to do due to insurance does not cover.  Will cut down the Abilify  from 20 mg to 15 mg.  If patient cardiac condition get worse then we will discontinue Vyvanse  and patient is aware about it.  So far patient has discussed with the cardiologist and no further  symptoms.  Continue Prozac  40 mg daily, Lamictal  100 mg daily and Vyvanse  40 mg daily to help her ADHD symptoms.  Encourage watching her calorie intake, exercise.  Will follow-up in 3 months in person.  Collaboration of Care: Collaboration of Care: Other provider involved in patient's care AEB notes are available in epic to review.  Patient/Guardian was advised Release of Information must be obtained prior to any record release in order to collaborate their care with an outside provider. Patient/Guardian was advised if they have not already done so to contact the registration department to sign all necessary forms in order for us  to release information regarding their care.   Consent: Patient/Guardian gives verbal consent for treatment and assignment of benefits for services provided during this visit. Patient/Guardian expressed understanding and agreed to proceed.   I provided 31 minutes face-to-face time during this encounter.  Leni ONEIDA Client, MD 10/01/2023, 10:08 AM

## 2023-10-06 ENCOUNTER — Other Ambulatory Visit (HOSPITAL_COMMUNITY): Payer: Self-pay | Admitting: Psychiatry

## 2023-10-06 DIAGNOSIS — F3131 Bipolar disorder, current episode depressed, mild: Secondary | ICD-10-CM

## 2023-10-06 DIAGNOSIS — F419 Anxiety disorder, unspecified: Secondary | ICD-10-CM

## 2023-10-29 ENCOUNTER — Other Ambulatory Visit (HOSPITAL_COMMUNITY): Payer: Self-pay | Admitting: Psychiatry

## 2023-10-29 DIAGNOSIS — F3131 Bipolar disorder, current episode depressed, mild: Secondary | ICD-10-CM

## 2023-10-29 DIAGNOSIS — F900 Attention-deficit hyperactivity disorder, predominantly inattentive type: Secondary | ICD-10-CM

## 2023-10-29 DIAGNOSIS — F419 Anxiety disorder, unspecified: Secondary | ICD-10-CM

## 2023-11-02 ENCOUNTER — Telehealth (HOSPITAL_COMMUNITY): Payer: Self-pay

## 2023-11-02 DIAGNOSIS — F900 Attention-deficit hyperactivity disorder, predominantly inattentive type: Secondary | ICD-10-CM

## 2023-11-02 MED ORDER — LISDEXAMFETAMINE DIMESYLATE 40 MG PO CAPS: 40.0000 mg | ORAL_CAPSULE | Freq: Every day | ORAL | 0 refills | Status: DC

## 2023-11-02 NOTE — Telephone Encounter (Signed)
 Prescription sent.  She can pick up on her due date

## 2023-11-02 NOTE — Telephone Encounter (Signed)
 Patient is calling for a refill on her Vyvanse  last filled on 7/3. Patient has a follow up and the pharmacy is the same. Please review and advise, thank you

## 2023-11-03 ENCOUNTER — Telehealth (HOSPITAL_COMMUNITY): Payer: Self-pay

## 2023-11-03 NOTE — Telephone Encounter (Signed)
 Hello,  Hello Patient, is requesting for a refill of her Fluoxetine  and her hydroxyzine . Patient runs out of these meds in two days.   Patient last seen 07/03   JNL

## 2023-11-04 ENCOUNTER — Other Ambulatory Visit (HOSPITAL_COMMUNITY): Payer: Self-pay

## 2023-11-04 DIAGNOSIS — F419 Anxiety disorder, unspecified: Secondary | ICD-10-CM

## 2023-11-04 DIAGNOSIS — F3131 Bipolar disorder, current episode depressed, mild: Secondary | ICD-10-CM

## 2023-11-04 MED ORDER — FLUOXETINE HCL 40 MG PO CAPS: 40.0000 mg | ORAL_CAPSULE | Freq: Every day | ORAL | 0 refills | Status: DC

## 2023-11-09 ENCOUNTER — Other Ambulatory Visit (HOSPITAL_COMMUNITY): Payer: Self-pay | Admitting: Psychiatry

## 2023-11-09 DIAGNOSIS — F900 Attention-deficit hyperactivity disorder, predominantly inattentive type: Secondary | ICD-10-CM

## 2023-11-12 ENCOUNTER — Ambulatory Visit (INDEPENDENT_AMBULATORY_CARE_PROVIDER_SITE_OTHER)

## 2023-11-12 DIAGNOSIS — Z3042 Encounter for surveillance of injectable contraceptive: Secondary | ICD-10-CM | POA: Diagnosis not present

## 2023-11-12 MED ORDER — MEDROXYPROGESTERONE ACETATE 150 MG/ML IM SUSP
150.0000 mg | Freq: Once | INTRAMUSCULAR | Status: AC
  Administered 2023-11-12: 150 mg via INTRAMUSCULAR

## 2023-11-12 NOTE — Progress Notes (Signed)
 Depo given IM in right glute on 11/12/23  By:  IC, CMA Next due: 01/28/24-11-13/25

## 2023-12-03 ENCOUNTER — Telehealth (HOSPITAL_COMMUNITY): Payer: Self-pay | Admitting: *Deleted

## 2023-12-03 DIAGNOSIS — F900 Attention-deficit hyperactivity disorder, predominantly inattentive type: Secondary | ICD-10-CM

## 2023-12-03 MED ORDER — LISDEXAMFETAMINE DIMESYLATE 40 MG PO CAPS: 40.0000 mg | ORAL_CAPSULE | Freq: Every day | ORAL | 0 refills | Status: DC

## 2023-12-03 NOTE — Addendum Note (Signed)
 Addended by: CURRY PATERSON T on: 12/03/2023 02:59 PM   Modules accepted: Orders

## 2023-12-03 NOTE — Telephone Encounter (Signed)
 Pt requests refill of the Vyvanse  40 mg caps. Last e-scribed on 11/06/23.  Last visit: 10/01/23 Next visit: 01/07/24

## 2023-12-03 NOTE — Telephone Encounter (Signed)
 Prescription sent.  She can pick up the medication on her due date.

## 2023-12-08 ENCOUNTER — Other Ambulatory Visit (HOSPITAL_COMMUNITY): Payer: Self-pay | Admitting: Psychiatry

## 2023-12-08 DIAGNOSIS — F3131 Bipolar disorder, current episode depressed, mild: Secondary | ICD-10-CM

## 2023-12-29 ENCOUNTER — Encounter (HOSPITAL_COMMUNITY): Payer: Self-pay

## 2023-12-29 ENCOUNTER — Ambulatory Visit (HOSPITAL_COMMUNITY)
Admission: EM | Admit: 2023-12-29 | Discharge: 2023-12-29 | Disposition: A | Discharge status: Home / Self Care | Attending: Family Medicine | Admitting: Family Medicine

## 2023-12-29 DIAGNOSIS — R112 Nausea with vomiting, unspecified: Secondary | ICD-10-CM

## 2023-12-29 DIAGNOSIS — R197 Diarrhea, unspecified: Secondary | ICD-10-CM

## 2023-12-29 MED ORDER — ONDANSETRON 4 MG PO TBDP
ORAL_TABLET | ORAL | Status: AC
  Filled 2023-12-29: qty 1

## 2023-12-29 MED ORDER — ONDANSETRON 4 MG PO TBDP
4.0000 mg | ORAL_TABLET | Freq: Three times a day (TID) | ORAL | 0 refills | Status: DC | PRN
Start: 2023-12-29 — End: 2024-01-28

## 2023-12-29 MED ORDER — ONDANSETRON 4 MG PO TBDP
4.0000 mg | ORAL_TABLET | Freq: Once | ORAL | Status: AC
  Administered 2023-12-29: 4 mg via ORAL

## 2023-12-29 NOTE — ED Triage Notes (Signed)
 Patient here today with c/o nausea, diarrhea, and vomiting today.

## 2023-12-29 NOTE — Discharge Instructions (Signed)
Please do your best to ensure adequate fluid intake in order to avoid dehydration. If you find that you are unable to tolerate drinking fluids regularly please proceed to the Emergency Department for evaluation. ° °Also, you should return to the hospital if you experience persistent fevers for greater than 1-2 more days, increasing abdominal pain that persists despite medications, persistent diarrhea, dizziness, syncope (fainting), or for any other concerns you may deem worrisome. °

## 2023-12-30 NOTE — ED Provider Notes (Signed)
 [facility]   [id number] 12/29/23 Arrival Time: 1849  ASSESSMENT & PLAN:  1. Nausea and vomiting, unspecified vomiting type   2. Diarrhea, unspecified type    Tolerating sips of water . Meds ordered this encounter  Medications   ondansetron  (ZOFRAN -ODT) disintegrating tablet 4 mg   ondansetron  (ZOFRAN -ODT) 4 MG disintegrating tablet    Sig: Take 1 tablet (4 mg total) by mouth every 8 (eight) hours as needed for nausea or vomiting.    Dispense:  15 tablet    Refill:  0   Discussed typical duration of symptoms for suspected viral GI illness. Will do her best to ensure adequate fluid intake in order to avoid dehydration. Will proceed to the Emergency Department for evaluation if unable to tolerate PO fluids regularly.  Otherwise she will f/u with her PCP or here if not showing improvement over the next 48-72 hours.  Reviewed expectations re: course of current medical issues. Questions answered. Outlined signs and symptoms indicating need for more acute intervention. Patient verbalized understanding. After Visit Summary given.   SUBJECTIVE: History from: patient.  Kristin Stephens is a 38 y.o. female who presents with complaint of non-bilious, non-bloody n/v with non-bloody diarrhea. Onset today. Abdominal discomfort: mild and cramping. Symptoms are unchanged since beginning. Aggravating factors: eating. Alleviating factors: none. Associated symptoms: fatigue. She denies dysuria and fever. Appetite: normal. PO intake: decreased. Ambulatory without assistance. Urinary symptoms: none. Sick contacts: none. Recent travel or camping: none. OTC treatment: none.  No LMP recorded (lmp unknown). Patient has had an injection.  Past Surgical History:  Procedure Laterality Date   ANKLE RECONSTRUCTION Left 08/14/2021   Procedure: RECONSTRUCTION ANKLE Open debridement of left peroneal tendons;  Surgeon: Barton Drape, MD;  Location: [facility];  Service:  Orthopedics;  Laterality: Left;   ASD AND VSD REPAIR  1989   CARDIAC CATHETERIZATION  06/18/2006   CESAREAN SECTION  04/12/2009   COARCTATION OF AORTA REPAIR  1989   COLONOSCOPY N/A 05/20/2013   Procedure: COLONOSCOPY;  Surgeon: Margo LITTIE Haddock, MD;  [facility];  Service: Endoscopy;  Laterality: N/A;  115-moved to 1230 Leigh Ann notified pt   ESOPHAGOGASTRODUODENOSCOPY  04/04/2008     Normal esophagus/Mild patchy erythema in the antrum/ Normal duodenal bulb, normal small bowel biopsy   FLEXIBLE SIGMOIDOSCOPY  04/04/2008     Small internal hemorrhoids (poor bowel prep)   HEMORRHOID BANDING N/A 05/20/2013   Procedure: HEMORRHOID BANDING;  Surgeon: Margo LITTIE Haddock, MD;  [facility];  Service: Endoscopy;  Laterality: N/A;   NASAL SEPTOPLASTY W/ TURBINOPLASTY Bilateral 05/31/2012   Procedure: NASAL SEPTOPLASTY WITH TURBINATE REDUCTION;  Surgeon: Ana LELON Moccasin, MD;  Location:  SUR[facility]ERY CENTER;  Service: ENT;  Laterality: Bilateral;    ROS: As per HPI.  OBJECTIVE:  Vitals:   12/29/23 1933  BP: 131/88  Pulse: 86  Resp: 16  Temp: 97.8 F (36.6 C)  TempSrc: Oral  SpO2: 97%    General appearance: alert; no distress Oropharynx: slightly dry Lungs: clear to auscultation bilaterally; unlabored Heart: regular rate and rhythm Abdomen: soft; non-distended; no significant abdominal tenderness; reports cramping feeling; bowel sounds present; no masses or organomegaly; no guarding or rebound tenderness Back: no CVA tenderness Extremities: no edema; symmetrical with no gross deformities Skin: warm; dry Neurologic: normal gait Psychological: alert and cooperative; normal mood and affect   Allergies  Allergen Reactions   Sulfonamide Derivatives Swelling    SWELLING OF EYES WITH OPHTHALMIC SULFA   Coconut (  Cocos Nucifera) Rash and Other (See Comments)   Coconut Flavoring Agent (Non-Screening) Rash                                               Past Medical History:   Diagnosis Date   Abnormal Pap smear of cervix    age 46 with hx of cryotherapy   ADHD (attention deficit hyperactivity disorder)    Anxiety    Asthma    daily and prn inhalers   Bipolar affective disorder (HCC)    COVID 05/2020   Dental crowns present    Depression    Deviated nasal septum 05/2012   Eczema    right leg   Esophageal reflux    Fibromyalgia    Focal nodular hyperplasia of liver    Heart murmur    Hypertension    IBS (irritable bowel syndrome)    Migraine headache    with aura   Nasal turbinate hypertrophy 05/2012   bilateral   OCD (obsessive compulsive disorder)    STD (sexually transmitted disease)    Hx of abnormal pap--age 62    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    Social History   Socioeconomic History   Marital status: Media planner    Spouse name: Not on file   Number of children: 1   Years of education: GED   Highest education level: Not on file  Occupational History   Occupation: Librarian, academic  Tobacco Use   Smoking status: Every Day   Smokeless tobacco: Current   Tobacco comments:    vape  Vaping Use   Vaping status: Every Day  Substance and Sexual Activity   Alcohol use: No    Comment: recovering alcoholic 8 mos sober as of 04/15/17   Drug use: No   Sexual activity: Yes    Partners: Male    Birth control/protection: Injection    Comment: Depo Provera   Other Topics Concern   Not on file  Social History Narrative      Social Drivers of Corporate investment banker Strain: Not on file  Food Insecurity: Not on file  Transportation Needs: Not on file  Physical Activity: Not on file  Stress: Not on file  Social Connections: Not on file  Intimate Partner Violence: Not on file   Family History  Problem Relation Age of Onset   Cancer Mother        leukemia   Hyperlipidemia Mother    Hypertension Mother    Depression Mother    Physical abuse Mother    Sexual abuse Mother        possibly   Sleep apnea  Mother    Stroke Mother    Personality disorder Father    Bipolar disorder Father    Alcohol abuse Father    ADD / ADHD Sister    Anxiety disorder Brother    ADD / ADHD Brother    Heart disease Maternal Grandmother    Dementia Maternal Grandmother    Stroke Maternal Grandfather    Prostate cancer Maternal Grandfather    Thyroid disease Maternal Grandfather    Emphysema Paternal Grandmother    Heart disease Paternal Grandmother    Bipolar disorder Paternal Grandmother    Dementia Paternal Grandmother    Seizures Daughter    Drug abuse Neg Hx  OCD Neg Hx    Paranoid behavior Neg Hx    Schizophrenia Neg Hx       Rolinda Rogue, MD 12/30/23 [phone]

## 2023-12-31 ENCOUNTER — Other Ambulatory Visit (HOSPITAL_COMMUNITY): Payer: Self-pay | Admitting: Psychiatry

## 2023-12-31 ENCOUNTER — Ambulatory Visit (HOSPITAL_COMMUNITY)

## 2023-12-31 DIAGNOSIS — F900 Attention-deficit hyperactivity disorder, predominantly inattentive type: Secondary | ICD-10-CM

## 2023-12-31 DIAGNOSIS — F419 Anxiety disorder, unspecified: Secondary | ICD-10-CM

## 2023-12-31 DIAGNOSIS — F3131 Bipolar disorder, current episode depressed, mild: Secondary | ICD-10-CM

## 2024-01-04 ENCOUNTER — Telehealth (HOSPITAL_COMMUNITY): Payer: Self-pay | Admitting: *Deleted

## 2024-01-04 DIAGNOSIS — F900 Attention-deficit hyperactivity disorder, predominantly inattentive type: Secondary | ICD-10-CM

## 2024-01-04 MED ORDER — LISDEXAMFETAMINE DIMESYLATE 40 MG PO CAPS: 40.0000 mg | ORAL_CAPSULE | Freq: Every day | ORAL | 0 refills | Status: DC

## 2024-01-04 NOTE — Telephone Encounter (Signed)
 She can pick up on her due date.

## 2024-01-04 NOTE — Telephone Encounter (Signed)
 Pt called to request a refill of the Vyvanse  40 mg caps. Showing last e-scribed on 12/07/23.   Last visit: 10/01/23 Next visit: 01/07/24

## 2024-01-05 ENCOUNTER — Other Ambulatory Visit (HOSPITAL_COMMUNITY): Payer: Self-pay | Admitting: *Deleted

## 2024-01-05 DIAGNOSIS — F3131 Bipolar disorder, current episode depressed, mild: Secondary | ICD-10-CM

## 2024-01-06 ENCOUNTER — Other Ambulatory Visit (HOSPITAL_COMMUNITY): Payer: Self-pay | Admitting: Psychiatry

## 2024-01-06 DIAGNOSIS — F419 Anxiety disorder, unspecified: Secondary | ICD-10-CM

## 2024-01-06 DIAGNOSIS — F3131 Bipolar disorder, current episode depressed, mild: Secondary | ICD-10-CM

## 2024-01-06 DIAGNOSIS — F900 Attention-deficit hyperactivity disorder, predominantly inattentive type: Secondary | ICD-10-CM

## 2024-01-07 ENCOUNTER — Encounter (HOSPITAL_COMMUNITY): Payer: Self-pay | Admitting: Psychiatry

## 2024-01-07 ENCOUNTER — Ambulatory Visit (HOSPITAL_COMMUNITY): Admitting: Psychiatry

## 2024-01-07 ENCOUNTER — Other Ambulatory Visit: Payer: Self-pay

## 2024-01-07 VITALS — BP 145/90 | HR 78 | Ht 65.0 in | Wt 227.0 lb

## 2024-01-07 DIAGNOSIS — F419 Anxiety disorder, unspecified: Secondary | ICD-10-CM

## 2024-01-07 DIAGNOSIS — F900 Attention-deficit hyperactivity disorder, predominantly inattentive type: Secondary | ICD-10-CM | POA: Diagnosis not present

## 2024-01-07 DIAGNOSIS — F3131 Bipolar disorder, current episode depressed, mild: Secondary | ICD-10-CM | POA: Diagnosis not present

## 2024-01-07 MED ORDER — FLUOXETINE HCL 40 MG PO CAPS: 40.0000 mg | ORAL_CAPSULE | Freq: Every day | ORAL | 0 refills | Status: DC

## 2024-01-07 MED ORDER — ARIPIPRAZOLE 15 MG PO TABS: 15.0000 mg | ORAL_TABLET | Freq: Every day | ORAL | 0 refills | Status: DC

## 2024-01-07 MED ORDER — LISDEXAMFETAMINE DIMESYLATE 40 MG PO CAPS: 40.0000 mg | ORAL_CAPSULE | Freq: Every day | ORAL | 0 refills | Status: AC

## 2024-01-07 MED ORDER — HYDROXYZINE HCL 25 MG PO TABS: 25.0000 mg | ORAL_TABLET | Freq: Every day | ORAL | 0 refills | Status: DC | PRN

## 2024-01-07 MED ORDER — LAMOTRIGINE 100 MG PO TABS: 100.0000 mg | ORAL_TABLET | Freq: Every day | ORAL | 0 refills | Status: DC

## 2024-01-07 NOTE — Progress Notes (Signed)
 [facility] MD/PA/NP OP Progress Note  01/07/2024 10:11 AM Kristin Stephens  MRN:  [id number]  Chief Complaint:  Chief Complaint  Patient presents with   Follow-up   Medication Refill   HPI: Patient came today to the office for her follow-up appointment.  Patient recently had a visit in the emergency room because of abdominal pain and nausea but doing much better.  On the last visit we decreased Abilify  from 20 mg to 15 mg.  She did not notice a significant worsening of symptoms but also disappointed because did not lost weight.  She started therapy at [facility] counseling once a week.  She reported feeling sad for past few days because her daughter now had a car given by patient's sister and she is not happy about it.  Patient told she is only 38 year old but understand that she may not be a part of the decision of her daughter's life.  However she is excited because going to see the daughter this weekend.  She like her job which started not too long ago.  She is adjusting well.  She is compliant with Lamictal , Prozac  and Vyvanse .  Her attention concentration is good.  She takes hydroxyzine  as needed.  She is also given gabapentin  from primary care which helps her fibromyalgia.  She denies any heart palpitation, dizziness, fall.  She had a cardiology visit earlier this year and she was told everything is fine after her EKG.  She denies any mania, psychosis, hallucination.  She has no tremor or shakes.  She reported relationship with the boyfriend is going very well.  She denies drinking or using any illegal substances.  She like to consider weight loss program has not seen decrease in the weight after reducing the Abilify .  However she like to keep the current dose of Abilify  since it is working well and do not have any mania or severe depression.  She denies any hallucination or any paranoia.  Visit Diagnosis:    ICD-10-CM   1. Bipolar affective disorder, currently depressed, mild (HCC)  F31.31 lamoTRIgine   (LAMICTAL ) 100 MG tablet    hydrOXYzine  (ATARAX ) 25 MG tablet    FLUoxetine  (PROZAC ) 40 MG capsule    ARIPiprazole  (ABILIFY ) 15 MG tablet    2. Anxiety  F41.9 hydrOXYzine  (ATARAX ) 25 MG tablet    FLUoxetine  (PROZAC ) 40 MG capsule    3. Attention deficit hyperactivity disorder (ADHD), predominantly inattentive type  F90.0 lisdexamfetamine (VYVANSE ) 40 MG capsule       Past Psychiatric History:  H/O mania, depression, anger, overdose and slashing her wrist.  Did IOP in 2016. Inpatient in October 2018. H/O ETOH. Took Adderall, Ritalin , Ativan , Gabapentin , Wellbutrin but limited response.      Past Medical History:  Past Medical History:  Diagnosis Date   Abnormal Pap smear of cervix    age 38 with hx of cryotherapy   ADHD (attention deficit hyperactivity disorder)    Anxiety    Asthma    daily and prn inhalers   Bipolar affective disorder (HCC)    COVID 05/2020   Dental crowns present    Depression    Deviated nasal septum 05/2012   Eczema    right leg   Esophageal reflux    Fibromyalgia    Focal nodular hyperplasia of liver    Heart murmur    Hypertension    IBS (irritable bowel syndrome)    Migraine headache    with aura   Nasal turbinate hypertrophy 05/2012   bilateral  OCD (obsessive compulsive disorder)    STD (sexually transmitted disease)    Hx of abnormal pap--age 38    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     Past Surgical History:  Procedure Laterality Date   ANKLE RECONSTRUCTION Left 08/14/2021   Procedure: RECONSTRUCTION ANKLE Open debridement of left peroneal tendons;  Surgeon: Barton Drape, MD;  Location: [facility];  Service: Orthopedics;  Laterality: Left;   ASD AND VSD REPAIR  1989   CARDIAC CATHETERIZATION  06/18/2006   CESAREAN SECTION  04/12/2009   COARCTATION OF AORTA REPAIR  1989   COLONOSCOPY N/A 05/20/2013   Procedure: COLONOSCOPY;  Surgeon: Margo LITTIE Haddock, MD;  [facility];  Service:  Endoscopy;  Laterality: N/A;  115-moved to 1230 Leigh Ann notified pt   ESOPHAGOGASTRODUODENOSCOPY  04/04/2008     Normal esophagus/Mild patchy erythema in the antrum/ Normal duodenal bulb, normal small bowel biopsy   FLEXIBLE SIGMOIDOSCOPY  04/04/2008     Small internal hemorrhoids (poor bowel prep)   HEMORRHOID BANDING N/A 05/20/2013   Procedure: HEMORRHOID BANDING;  Surgeon: Margo LITTIE Haddock, MD;  [facility]: [facility];  Service: Endoscopy;  Laterality: N/A;   NASAL SEPTOPLASTY W/ TURBINOPLASTY Bilateral 05/31/2012   Procedure: NASAL SEPTOPLASTY WITH TURBINATE REDUCTION;  Surgeon: Ana LELON Moccasin, MD;  Location: [facility];  Service: ENT;  Laterality: Bilateral;    Family Psychiatric History: Reviewed.  Family History:  Family History  Problem Relation Age of Onset   Cancer Mother        leukemia   Hyperlipidemia Mother    Hypertension Mother    Depression Mother    Physical abuse Mother    Sexual abuse Mother        possibly   Sleep apnea Mother    Stroke Mother    Personality disorder Father    Bipolar disorder Father    Alcohol abuse Father    ADD / ADHD Sister    Anxiety disorder Brother    ADD / ADHD Brother    Heart disease Maternal Grandmother    Dementia Maternal Grandmother    Stroke Maternal Grandfather    Prostate cancer Maternal Grandfather    Thyroid disease Maternal Grandfather    Emphysema Paternal Grandmother    Heart disease Paternal Grandmother    Bipolar disorder Paternal Grandmother    Dementia Paternal Grandmother    Seizures Daughter    Drug abuse Neg Hx    OCD Neg Hx    Paranoid behavior Neg Hx    Schizophrenia Neg Hx     Social History:  Social History   Socioeconomic History   Marital status: Media planner    Spouse name: Not on file   Number of children: 1   Years of education: GED   Highest education level: Not on file  Occupational History   Occupation: Librarian, academic  Tobacco Use   Smoking status: Every Day   Smokeless  tobacco: Current   Tobacco comments:    vape  Vaping Use   Vaping status: Every Day  Substance and Sexual Activity   Alcohol use: No    Comment: recovering alcoholic 8 mos sober as of 04/15/17   Drug use: No   Sexual activity: Yes    Partners: Male    Birth control/protection: Injection    Comment: Depo Provera   Other Topics Concern   Not on file  Social History Narrative      Social  Drivers of Corporate investment banker Strain: Not on file  Food Insecurity: Not on file  Transportation Needs: Not on file  Physical Activity: Not on file  Stress: Not on file  Social Connections: Not on file    Allergies:  Allergies  Allergen Reactions   Sulfonamide Derivatives Swelling    SWELLING OF EYES WITH OPHTHALMIC SULFA   Coconut (Cocos Nucifera) Rash and Other (See Comments)   Coconut Flavoring Agent (Non-Screening) Rash    Metabolic Disorder Labs: Lab Results  Component Value Date   HGBA1C 5.0 07/17/2014   MPG 97 08/28/2011   No results found for: PROLACTIN Lab Results  Component Value Date   CHOL 175 12/30/2018   TRIG 84 12/30/2018   HDL 45 12/30/2018   CHOLHDL 3.9 12/30/2018   VLDL 14 02/06/2007   LDLCALC 114 (H) 12/30/2018   LDLCALC 108 (H) 06/04/2017   Lab Results  Component Value Date   TSH 1.299 03/31/2008   TSH 1.424 Test methodology is 3rd generation TSH 02/05/2007    Therapeutic Level Labs: No results found for: LITHIUM No results found for: VALPROATE No results found for: CBMZ  Current Medications: Current Outpatient Medications  Medication Sig Dispense Refill   acetaminophen  (TYLENOL ) 500 MG tablet Take 1,000-2,000 mg by mouth every 6 (six) hours as needed for mild pain, moderate pain, fever or headache.     ARIPiprazole  (ABILIFY ) 15 MG tablet Take 1 tablet (15 mg total) by mouth daily. 90 tablet 0   diclofenac  Sodium (VOLTAREN ) 1 % GEL Apply topically 4 (four) times daily.     FLUoxetine  (PROZAC ) 40 MG capsule Take 1 capsule (40 mg  total) by mouth daily. 90 capsule 0   fluticasone  (FLONASE ) 50 MCG/ACT nasal spray USE 2 SPRAYS IN EACH NOSTRIL EVERY DAY 16 g 5   gabapentin  (NEURONTIN ) 100 MG capsule 1 capsule Orally three times a day for 30 days As needed for pain, caution sedation     hydrOXYzine  (ATARAX ) 25 MG tablet Take 1 tablet (25 mg total) by mouth daily as needed for anxiety. 90 tablet 0   ibuprofen (ADVIL) 200 MG tablet Take 200 mg by mouth every 6 (six) hours as needed for mild pain or headache.     lamoTRIgine  (LAMICTAL ) 100 MG tablet Take 1 tablet (100 mg total) by mouth at bedtime. 90 tablet 0   lisdexamfetamine (VYVANSE ) 40 MG capsule Take 1 capsule (40 mg total) by mouth daily. 30 capsule 0   loratadine  (CLARITIN ) 10 MG tablet Take 1 tablet by mouth daily as needed for allergies.     medroxyPROGESTERone  (DEPO-PROVERA ) 150 MG/ML injection Inject 150 mg into the muscle every 3 (three) months.     metoprolol  succinate (TOPROL -XL) 50 MG 24 hr tablet Take 1 1/2 tablets (75 mg ) by mouth in the morning and 1 tablet (50 mg) in the evening.     ondansetron  (ZOFRAN -ODT) 4 MG disintegrating tablet Take 1 tablet (4 mg total) by mouth every 8 (eight) hours as needed for nausea or vomiting. 15 tablet 0   pantoprazole  (PROTONIX ) 40 MG tablet Take 1 tablet (40 mg total) by mouth daily. 30 tablet 0   No current facility-administered medications for this visit.     Musculoskeletal: Strength & Muscle Tone: within normal limits Gait & Station: normal Patient leans: N/A Psychiatric Specialty Exam: Physical Exam  Review of Systems  Blood pressure (!) 145/90, pulse 78, height 5' 5 (1.651 m), weight 227 lb (103 kg).Body mass index is 37.77 kg/m.  General Appearance: Well Groomed  Eye Contact:  Good  Speech:  Normal Rate  Volume:  Normal  Mood:  Emotional when talk about her daughter  Affect:  Appropriate  Thought Process:  Goal Directed  Orientation:  Full (Time, Place, and Person)  Thought Content:  WDL  Suicidal  Thoughts:  No  Homicidal Thoughts:  No  Memory:  Immediate;   Good Recent;   Good Remote;   Good  Judgement:  Intact  Insight:  Present  Psychomotor Activity:  Normal  Concentration:  Concentration: Good and Attention Span: Good  Recall:  Good  Fund of Knowledge:  Good  Language:  Good  Akathisia:  No  Handed:  Right  AIMS (if indicated):     Assets:  Communication Skills Desire for Improvement Housing Resilience Social Support Talents/Skills Transportation  ADL's:  Intact  Cognition:  WNL  Sleep:   Okay occasionally takes hydroxyzine      Screenings: AIMS    Flowsheet Row [facility]  AIMS Total Score 0   AUDIT    Flowsheet Row [facility]  Alcohol Use Disorder Identification Test Final Score (AUDIT) 0   Mini-Mental    Flowsheet Row Office Visit from 07/31/2016 in [facility]  Total Score (max 30 points ) 29   PHQ2-9    Flowsheet Row Office Visit from 03/02/2017 in [facility]  PHQ-2 Total Score 2  PHQ-9 Total Score 11   Flowsheet Row UC from 12/29/2023 in [facility] ED from 12/07/2022 in [facility] Department at [facility] ED from 12/03/2022 in [facility] Department at [facility]  C-SSRS RISK CATEGORY No Risk No Risk No Risk     Assessment and Plan: Patient is 38 year old Caucasian female with a history of migraine headaches, hypertension, ASVD, VSD, coarctation of aorta, history of high liver enzymes, GERD, IBS, bipolar disorder, anxiety and ADHD.  Review collateral information from emergency room visit including blood work results, current medication.  Patient concerned about weight gain and despite cutting down the Abilify  had not seen significant improvement.  Discussed to consider weight loss program.  She  is also on birth control which could be the reason she is not able to lose weight.  Encouraged watching her calorie intake, exercise.  Discussed not to change the medication since symptoms are stable.  Continue Vyvanse  40 mg in the morning to help her ADHD symptoms, Lamictal  100 mg daily, Prozac  40 mg daily, Abilify  15 mg daily and hydroxyzine  25 mg as needed for anxiety.  Will refer her for weight loss program.  Encouraged to call back if she is any question or any concern.  Follow-up in 3 months.  I discussed stimulant abuse, tolerance and withdrawal and interaction with substances.  Patient reported that she has not drinking and that while and has no desire to go back to drinking.   Collaboration of Care: Collaboration of Care: Other provider involved in patient's care AEB notes are available in epic to review.  Patient/Guardian was advised Release of Information must be obtained prior to any record release in order to collaborate their care with an outside provider. Patient/Guardian was advised if they have not already done so to contact the registration department to sign all necessary forms in order for us  to release information regarding their care.   Consent: Patient/Guardian gives verbal consent for treatment  and assignment of benefits for services provided during this visit. Patient/Guardian expressed understanding and agreed to proceed.   I provided 28 minutes face-to-face time during this encounter.  Leni ONEIDA Client, MD 01/07/2024, 10:11 AM

## 2024-01-26 ENCOUNTER — Observation Stay (HOSPITAL_COMMUNITY)
Admission: EM | Admit: 2024-01-26 | Discharge: 2024-01-28 | Disposition: A | Discharge status: Home / Self Care | Source: Ambulatory Visit | Attending: Internal Medicine | Admitting: Internal Medicine

## 2024-01-26 ENCOUNTER — Emergency Department (HOSPITAL_COMMUNITY)

## 2024-01-26 ENCOUNTER — Encounter (HOSPITAL_COMMUNITY): Payer: Self-pay | Admitting: Emergency Medicine

## 2024-01-26 ENCOUNTER — Other Ambulatory Visit: Payer: Self-pay

## 2024-01-26 DIAGNOSIS — E876 Hypokalemia: Secondary | ICD-10-CM | POA: Insufficient documentation

## 2024-01-26 DIAGNOSIS — J45909 Unspecified asthma, uncomplicated: Secondary | ICD-10-CM | POA: Insufficient documentation

## 2024-01-26 DIAGNOSIS — R918 Other nonspecific abnormal finding of lung field: Secondary | ICD-10-CM | POA: Diagnosis not present

## 2024-01-26 DIAGNOSIS — R112 Nausea with vomiting, unspecified: Principal | ICD-10-CM

## 2024-01-26 DIAGNOSIS — F909 Attention-deficit hyperactivity disorder, unspecified type: Secondary | ICD-10-CM | POA: Diagnosis not present

## 2024-01-26 DIAGNOSIS — K219 Gastro-esophageal reflux disease without esophagitis: Principal | ICD-10-CM | POA: Insufficient documentation

## 2024-01-26 DIAGNOSIS — I4581 Long QT syndrome: Secondary | ICD-10-CM | POA: Insufficient documentation

## 2024-01-26 DIAGNOSIS — I10 Essential (primary) hypertension: Secondary | ICD-10-CM | POA: Diagnosis not present

## 2024-01-26 DIAGNOSIS — F908 Attention-deficit hyperactivity disorder, other type: Secondary | ICD-10-CM | POA: Diagnosis not present

## 2024-01-26 DIAGNOSIS — F411 Generalized anxiety disorder: Secondary | ICD-10-CM | POA: Insufficient documentation

## 2024-01-26 DIAGNOSIS — R000 Tachycardia, unspecified: Secondary | ICD-10-CM | POA: Diagnosis not present

## 2024-01-26 DIAGNOSIS — R109 Unspecified abdominal pain: Secondary | ICD-10-CM | POA: Diagnosis present

## 2024-01-26 DIAGNOSIS — R9431 Abnormal electrocardiogram [ECG] [EKG]: Secondary | ICD-10-CM | POA: Insufficient documentation

## 2024-01-26 DIAGNOSIS — F418 Other specified anxiety disorders: Secondary | ICD-10-CM | POA: Diagnosis present

## 2024-01-26 DIAGNOSIS — M797 Fibromyalgia: Secondary | ICD-10-CM | POA: Diagnosis present

## 2024-01-26 DIAGNOSIS — J4520 Mild intermittent asthma, uncomplicated: Secondary | ICD-10-CM

## 2024-01-26 DIAGNOSIS — F319 Bipolar disorder, unspecified: Secondary | ICD-10-CM | POA: Insufficient documentation

## 2024-01-26 DIAGNOSIS — K589 Irritable bowel syndrome without diarrhea: Secondary | ICD-10-CM | POA: Insufficient documentation

## 2024-01-26 DIAGNOSIS — R1907 Generalized intra-abdominal and pelvic swelling, mass and lump: Secondary | ICD-10-CM | POA: Diagnosis not present

## 2024-01-26 DIAGNOSIS — F3181 Bipolar II disorder: Secondary | ICD-10-CM | POA: Diagnosis present

## 2024-01-26 LAB — COMPREHENSIVE METABOLIC PANEL WITH GFR
ALT: 21 U/L (ref 0–44)
AST: 21 U/L (ref 15–41)
Albumin: 5 g/dL (ref 3.5–5.0)
Alkaline Phosphatase: 86 U/L (ref 38–126)
Anion gap: 15 (ref 5–15)
BUN: 10 mg/dL (ref 6–20)
CO2: 26 mmol/L (ref 22–32)
Calcium: 10.4 mg/dL — ABNORMAL HIGH (ref 8.9–10.3)
Chloride: 96 mmol/L — ABNORMAL LOW (ref 98–111)
Creatinine, Ser: 0.77 mg/dL (ref 0.44–1.00)
GFR, Estimated: >60 mL/min (ref >60)
Glucose, Bld: 135 mg/dL — ABNORMAL HIGH (ref 70–99)
Potassium: 3.1 mmol/L — ABNORMAL LOW (ref 3.5–5.1)
Sodium: 138 mmol/L (ref 135–145)
Total Bilirubin: 1.1 mg/dL (ref 0.0–1.2)
Total Protein: 8.6 g/dL — ABNORMAL HIGH (ref 6.5–8.1)

## 2024-01-26 LAB — CBC
HCT: 49.3 % — ABNORMAL HIGH (ref 36.0–46.0)
Hemoglobin: 16 g/dL — ABNORMAL HIGH (ref 12.0–15.0)
MCH: 28.4 pg (ref 26.0–34.0)
MCHC: 32.5 g/dL (ref 30.0–36.0)
MCV: 87.6 fL (ref 80.0–100.0)
Platelets: 595 K/uL — ABNORMAL HIGH (ref 150–400)
RBC: 5.63 MIL/uL — ABNORMAL HIGH (ref 3.87–5.11)
RDW: 13.4 % (ref 11.5–15.5)
WBC: 12.9 K/uL — ABNORMAL HIGH (ref 4.0–10.5)
nRBC: 0 % (ref 0.0–0.2)

## 2024-01-26 LAB — URINALYSIS, ROUTINE W REFLEX MICROSCOPIC
Bilirubin Urine: NEGATIVE
Glucose, UA: NEGATIVE mg/dL
Ketones, ur: NEGATIVE mg/dL
Leukocytes,Ua: NEGATIVE
Nitrite: NEGATIVE
Protein, ur: 100 mg/dL — AB
Specific Gravity, Urine: 1.034 — ABNORMAL HIGH (ref 1.005–1.030)
pH: 5 (ref 5.0–8.0)

## 2024-01-26 LAB — HCG, SERUM, QUALITATIVE: Preg, Serum: NEGATIVE

## 2024-01-26 LAB — LIPASE, BLOOD: Lipase: 28 U/L (ref 11–51)

## 2024-01-26 MED ORDER — ARIPIPRAZOLE 5 MG PO TABS: 15.0000 mg | ORAL_TABLET | Freq: Every day | ORAL | Status: DC

## 2024-01-26 MED ORDER — SODIUM CHLORIDE 0.9% FLUSH
3.0000 mL | Freq: Two times a day (BID) | INTRAVENOUS | Status: DC
  Administered 2024-01-27 – 2024-01-28 (×2): 3 mL via INTRAVENOUS

## 2024-01-26 MED ORDER — DIPHENHYDRAMINE HCL 50 MG/ML IJ SOLN: 12.5000 mg | Freq: Once | INTRAMUSCULAR | Status: DC

## 2024-01-26 MED ORDER — PROCHLORPERAZINE EDISYLATE 10 MG/2ML IJ SOLN: 10.0000 mg | Freq: Once | INTRAMUSCULAR | Status: DC

## 2024-01-26 MED ORDER — ACETAMINOPHEN 325 MG PO TABS
650.0000 mg | ORAL_TABLET | Freq: Four times a day (QID) | ORAL | Status: DC | PRN
  Administered 2024-01-27: 650 mg via ORAL
  Filled 2024-01-26: qty 2

## 2024-01-26 MED ORDER — POTASSIUM CHLORIDE 10 MEQ/100ML IV SOLN
10.0000 meq | INTRAVENOUS | Status: AC
  Administered 2024-01-26 – 2024-01-27 (×4): 10 meq via INTRAVENOUS
  Filled 2024-01-26 (×4): qty 100

## 2024-01-26 MED ORDER — SODIUM CHLORIDE 0.9 % IV SOLN: 250.0000 mL | INTRAVENOUS | Status: AC | PRN

## 2024-01-26 MED ORDER — PANTOPRAZOLE SODIUM 40 MG PO TBEC
40.0000 mg | DELAYED_RELEASE_TABLET | Freq: Every day | ORAL | Status: DC
  Administered 2024-01-26: 40 mg via ORAL
  Filled 2024-01-26: qty 1

## 2024-01-26 MED ORDER — ALUM & MAG HYDROXIDE-SIMETH 200-200-20 MG/5ML PO SUSP
15.0000 mL | Freq: Four times a day (QID) | ORAL | Status: DC | PRN
  Administered 2024-01-28: 15 mL via ORAL
  Filled 2024-01-26: qty 30

## 2024-01-26 MED ORDER — ONDANSETRON HCL 4 MG/2ML IJ SOLN
4.0000 mg | Freq: Once | INTRAMUSCULAR | Status: AC
  Administered 2024-01-26: 4 mg via INTRAVENOUS
  Filled 2024-01-26: qty 2

## 2024-01-26 MED ORDER — SODIUM CHLORIDE 0.9% FLUSH
3.0000 mL | Freq: Two times a day (BID) | INTRAVENOUS | Status: DC
  Administered 2024-01-27 (×2): 3 mL via INTRAVENOUS

## 2024-01-26 MED ORDER — TRIMETHOBENZAMIDE HCL 100 MG/ML IM SOLN
200.0000 mg | Freq: Four times a day (QID) | INTRAMUSCULAR | Status: DC | PRN
  Administered 2024-01-27: 200 mg via INTRAMUSCULAR
  Filled 2024-01-26 (×2): qty 2

## 2024-01-26 MED ORDER — PROMETHAZINE HCL 25 MG/ML IJ SOLN
12.5000 mg | Freq: Once | INTRAMUSCULAR | Status: AC
  Administered 2024-01-26: 12.5 mg via INTRAVENOUS
  Filled 2024-01-26: qty 12.5

## 2024-01-26 MED ORDER — METOCLOPRAMIDE HCL 5 MG/ML IJ SOLN
5.0000 mg | Freq: Three times a day (TID) | INTRAMUSCULAR | Status: DC
Start: 2024-01-26 — End: 2024-01-28
  Administered 2024-01-26 – 2024-01-28 (×5): 5 mg via INTRAVENOUS
  Filled 2024-01-26 (×5): qty 2

## 2024-01-26 MED ORDER — SODIUM CHLORIDE 0.9 % IV SOLN
2.0000 g | Freq: Once | INTRAVENOUS | Status: AC
  Administered 2024-01-26: 2 g via INTRAVENOUS
  Filled 2024-01-26: qty 20

## 2024-01-26 MED ORDER — METOPROLOL SUCCINATE ER 50 MG PO TB24
75.0000 mg | ORAL_TABLET | Freq: Two times a day (BID) | ORAL | Status: DC
  Administered 2024-01-26: 75 mg via ORAL
  Filled 2024-01-26: qty 1

## 2024-01-26 MED ORDER — ARIPIPRAZOLE 5 MG PO TABS
15.0000 mg | ORAL_TABLET | Freq: Every day | ORAL | Status: DC
  Administered 2024-01-27: 15 mg via ORAL
  Filled 2024-01-26: qty 3

## 2024-01-26 MED ORDER — METOCLOPRAMIDE HCL 5 MG/ML IJ SOLN
5.0000 mg | Freq: Once | INTRAMUSCULAR | Status: DC
Start: 2024-01-26 — End: 2024-01-26

## 2024-01-26 MED ORDER — ENOXAPARIN SODIUM 40 MG/0.4ML IJ SOSY
40.0000 mg | PREFILLED_SYRINGE | INTRAMUSCULAR | Status: DC
  Administered 2024-01-26: 40 mg via SUBCUTANEOUS
  Filled 2024-01-26: qty 0.4

## 2024-01-26 MED ORDER — FLUOXETINE HCL 20 MG PO CAPS
40.0000 mg | ORAL_CAPSULE | Freq: Every day | ORAL | Status: DC
Start: 2024-01-27 — End: 2024-01-28
  Administered 2024-01-27: 40 mg via ORAL
  Filled 2024-01-26: qty 2

## 2024-01-26 MED ORDER — HYDROXYZINE HCL 25 MG PO TABS
25.0000 mg | ORAL_TABLET | Freq: Every day | ORAL | Status: DC | PRN
Start: 2024-01-26 — End: 2024-01-28
  Administered 2024-01-27 – 2024-01-28 (×2): 25 mg via ORAL
  Filled 2024-01-26 (×2): qty 1

## 2024-01-26 MED ORDER — DOCUSATE SODIUM 100 MG PO CAPS
100.0000 mg | ORAL_CAPSULE | Freq: Two times a day (BID) | ORAL | Status: DC
  Administered 2024-01-26 – 2024-01-28 (×2): 100 mg via ORAL
  Filled 2024-01-26 (×3): qty 1

## 2024-01-26 MED ORDER — ACETAMINOPHEN 650 MG RE SUPP: 650.0000 mg | Freq: Four times a day (QID) | RECTAL | Status: DC | PRN

## 2024-01-26 MED ORDER — LEVALBUTEROL HCL 0.63 MG/3ML IN NEBU: 0.6300 mg | INHALATION_SOLUTION | Freq: Four times a day (QID) | RESPIRATORY_TRACT | Status: DC | PRN

## 2024-01-26 MED ORDER — LAMOTRIGINE 100 MG PO TABS
100.0000 mg | ORAL_TABLET | Freq: Every day | ORAL | Status: DC
  Administered 2024-01-26 – 2024-01-27 (×2): 100 mg via ORAL
  Filled 2024-01-26 (×2): qty 1

## 2024-01-26 MED ORDER — IOHEXOL 300 MG/ML  SOLN
100.0000 mL | Freq: Once | INTRAMUSCULAR | Status: AC | PRN
  Administered 2024-01-26: 100 mL via INTRAVENOUS

## 2024-01-26 MED ORDER — DEXTROSE IN LACTATED RINGERS 5 % IV SOLN: INTRAVENOUS | Status: AC

## 2024-01-26 MED ORDER — SODIUM CHLORIDE 0.9 % IV BOLUS
1000.0000 mL | Freq: Once | INTRAVENOUS | Status: AC
  Administered 2024-01-26: 1000 mL via INTRAVENOUS

## 2024-01-26 MED ORDER — SODIUM CHLORIDE 0.9% FLUSH: 3.0000 mL | INTRAVENOUS | Status: DC | PRN

## 2024-01-26 MED ORDER — ONDANSETRON HCL 4 MG/2ML IJ SOLN: 4.0000 mg | Freq: Once | INTRAMUSCULAR | Status: DC

## 2024-01-26 MED ORDER — FLUOXETINE HCL 40 MG PO CAPS: 40.0000 mg | ORAL_CAPSULE | Freq: Every day | ORAL | Status: DC

## 2024-01-26 NOTE — H&P (Signed)
 History and Physical    Kristin Stephens FMW:[id number] DOB: 03-31-1986 DOA: 01/26/2024  PCP: Shepard Ade, MD   Patient coming from: Home   Chief Complaint:  Chief Complaint  Patient presents with   Abdominal Pain   ED TRIAGE note:  PT reports abdominal pain with n/v that started Saturday. Pt reports she took her dose of Zepbound on Friday. Sent by PCP for pancreatitis r/o.      HPI:  Kristin Stephens is a 38 y.o. female with medical history significant of morbid obesity, anxiety, depression, bipolar disorder, asthma, fibromyalgia, GERD, eczema, IBS, migraine and OCD presented to emergency department referred from primary care clinic as concern for development of pancreatitis after patient received Zepbound and Mounjaro.  Patient presenting with intractable nausea, vomiting with associated abdominal pain.  Unable to tolerate any oral food. Reported nonbiliary vomiting.  Denies any hematemesis. Due to my evaluation at bedside patient denies any abdominal pain and vomiting is still feeling nauseated.  Denies any constipation and diarrhea. Nuys chest pain, dyspnea, palpitation and headache. Patient denies any UTI and upper respiratory tract symptoms.  No other complaint at this time.   ED Course:  At presentation to ED patient is tachycardic heart rate 121 and borderline hypertensive.  Afebrile. Lab, UA showed bacteria present however negative nitrate and leukocyte esterase rules out UTI.  Pregnancy test negative.  CBC showing leukocytosis 12.9, stable H&H and elevated platelet count 595. CMP showing low potassium 3.1, elevated calcium 10.4 otherwise unremarkable.  Normal hepatic panel and lipase level.  CT abdomen pelvis showing: 1. Stable enhancing mass in the right hepatic dome measuring 6.3 cm with central scar, consistent with benign focal nodular hyperplasia. 2. Several right iliac lymph nodes measuring up to 2.7 cm, stable.  In the ED patient received ceftriaxone  for  concern for UTI.  1 L of LR bolus Zofran  and Phenergan .  Hospitalist consulted for further evaluation management of intractable nausea- vomiting and hypokalemia.   Significant labs in the ED: Lab Orders         Urine Culture         Lipase, blood         Comprehensive metabolic panel         CBC         Urinalysis, Routine w reflex microscopic -Urine, Clean Catch         hCG, serum, qualitative         Comprehensive metabolic panel         CBC         Urine Drug Screen         Ethanol         HIV Antibody (routine testing w rflx)       Review of Systems:  Review of Systems  Constitutional:  Positive for malaise/fatigue. Negative for chills, fever and weight loss.  Respiratory:  Negative for cough, hemoptysis and sputum production.   Cardiovascular:  Negative for chest pain, palpitations and leg swelling.  Gastrointestinal:  Positive for nausea and vomiting. Negative for abdominal pain, blood in stool, constipation, diarrhea and heartburn.  Genitourinary:  Negative for dysuria, frequency and urgency.  Musculoskeletal:  Negative for joint pain and myalgias.  Neurological:  Negative for dizziness and headaches.  Psychiatric/Behavioral:  The patient is not nervous/anxious.     Past Medical History:  Diagnosis Date   Abnormal Pap smear of cervix    age 68 with hx of cryotherapy   ADHD (attention deficit hyperactivity disorder)  Anxiety    Asthma    daily and prn inhalers   Bipolar affective disorder (HCC)    COVID 05/2020   Dental crowns present    Depression    Deviated nasal septum 05/2012   Eczema    right leg   Esophageal reflux    Fibromyalgia    Focal nodular hyperplasia of liver    Heart murmur    Hypertension    IBS (irritable bowel syndrome)    Migraine headache    with aura   Nasal turbinate hypertrophy 05/2012   bilateral   OCD (obsessive compulsive disorder)    STD (sexually transmitted disease)    Hx of abnormal pap--age 37    Substance abuse  (HCC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   TMJ syndrome     Past Surgical History:  Procedure Laterality Date   ANKLE RECONSTRUCTION Left 08/14/2021   Procedure: RECONSTRUCTION ANKLE Open debridement of left peroneal tendons;  Surgeon: Barton Drape, MD;  Location: [facility];  Service: Orthopedics;  Laterality: Left;   ASD AND VSD REPAIR  1989   CARDIAC CATHETERIZATION  06/18/2006   CESAREAN SECTION  04/12/2009   COARCTATION OF AORTA REPAIR  1989   COLONOSCOPY N/A 05/20/2013   Procedure: COLONOSCOPY;  Surgeon: Margo LITTIE Haddock, MD;  Location: [facility];  Service: Endoscopy;  Laterality: N/A;  115-moved to 1230 Leigh Ann notified pt   ESOPHAGOGASTRODUODENOSCOPY  04/04/2008     Normal esophagus/Mild patchy erythema in the antrum/ Normal duodenal bulb, normal small bowel biopsy   FLEXIBLE SIGMOIDOSCOPY  04/04/2008     Small internal hemorrhoids (poor bowel prep)   HEMORRHOID BANDING N/A 05/20/2013   Procedure: HEMORRHOID BANDING;  Surgeon: Margo LITTIE Haddock, MD;  Location: [facility];  Service: Endoscopy;  Laterality: N/A;   NASAL SEPTOPLASTY W/ TURBINOPLASTY Bilateral 05/31/2012   Procedure: NASAL SEPTOPLASTY WITH TURBINATE REDUCTION;  Surgeon: Ana LELON Moccasin, MD;  Location: [facility];  Service: ENT;  Laterality: Bilateral;     reports that she has been smoking. She uses smokeless tobacco. She reports that she does not drink alcohol and does not use drugs.  Allergies  Allergen Reactions   Sulfonamide Derivatives Swelling    SWELLING OF EYES WITH OPHTHALMIC SULFA   Coconut (Cocos Nucifera) Rash and Other (See Comments)   Coconut Flavoring Agent (Non-Screening) Rash    Family History  Problem Relation Age of Onset   Cancer Mother        leukemia   Hyperlipidemia Mother    Hypertension Mother    Depression Mother    Physical abuse Mother    Sexual abuse Mother        possibly   Sleep apnea Mother    Stroke Mother    Personality disorder Father    Bipolar  disorder Father    Alcohol abuse Father    ADD / ADHD Sister    Anxiety disorder Brother    ADD / ADHD Brother    Heart disease Maternal Grandmother    Dementia Maternal Grandmother    Stroke Maternal Grandfather    Prostate cancer Maternal Grandfather    Thyroid disease Maternal Grandfather    Emphysema Paternal Grandmother    Heart disease Paternal Grandmother    Bipolar disorder Paternal Grandmother    Dementia Paternal Grandmother    Seizures Daughter    Drug abuse Neg Hx    OCD Neg Hx    Paranoid behavior Neg Hx    Schizophrenia  Neg Hx     Prior to Admission medications   Medication Sig Start Date End Date Taking? Authorizing Provider  acetaminophen  (TYLENOL ) 500 MG tablet Take 1,000-2,000 mg by mouth every 6 (six) hours as needed for mild pain, moderate pain, fever or headache.    [provider]  ARIPiprazole  (ABILIFY ) 15 MG tablet Take 1 tablet (15 mg total) by mouth daily. 01/07/24   Arfeen, Leni DASEN, MD  diclofenac  Sodium (VOLTAREN ) 1 % GEL Apply topically 4 (four) times daily.    [provider]  FLUoxetine  (PROZAC ) 40 MG capsule Take 1 capsule (40 mg total) by mouth daily. 01/07/24   Arfeen, Leni DASEN, MD  fluticasone  (FLONASE ) 50 MCG/ACT nasal spray USE 2 SPRAYS IN EACH NOSTRIL EVERY DAY 09/21/17   Alphonsa Elsie RAMAN, MD  gabapentin  (NEURONTIN ) 100 MG capsule 1 capsule Orally three times a day for 30 days As needed for pain, caution sedation 12/23/22   [provider]  hydrOXYzine  (ATARAX ) 25 MG tablet Take 1 tablet (25 mg total) by mouth daily as needed for anxiety. 01/07/24   Arfeen, Syed T, MD  ibuprofen (ADVIL) 200 MG tablet Take 200 mg by mouth every 6 (six) hours as needed for mild pain or headache.    [provider]  lamoTRIgine  (LAMICTAL ) 100 MG tablet Take 1 tablet (100 mg total) by mouth at bedtime. 01/07/24   Arfeen, Syed T, MD  lisdexamfetamine (VYVANSE ) 40 MG capsule Take 1 capsule (40 mg total) by mouth daily. 02/04/24 03/05/24   Arfeen, Leni DASEN, MD  loratadine  (CLARITIN ) 10 MG tablet Take 1 tablet by mouth daily as needed for allergies.    [provider]  medroxyPROGESTERone  (DEPO-PROVERA ) 150 MG/ML injection Inject 150 mg into the muscle every 3 (three) months.    [provider]  metoprolol  succinate (TOPROL -XL) 50 MG 24 hr tablet Take 1 1/2 tablets (75 mg ) by mouth in the morning and 1 tablet (50 mg) in the evening. 06/08/23   Okey Vina GAILS, MD  ondansetron  (ZOFRAN -ODT) 4 MG disintegrating tablet Take 1 tablet (4 mg total) by mouth every 8 (eight) hours as needed for nausea or vomiting. 12/29/23   Rolinda Rogue, MD  pantoprazole  (PROTONIX ) 40 MG tablet Take 1 tablet (40 mg total) by mouth daily. 12/03/22   Shermon Warren SAILOR, PA-C     Physical Exam: Vitals:   01/26/24 1654 01/26/24 2100 01/26/24 2130  BP: (!) 152/107 (!) 148/99 133/83  Pulse: (!) 121 92 89  Resp: 18  18  Temp: 98.8 F (37.1 C)  98.5 F (36.9 C)  TempSrc: Oral    SpO2: 98% 96% 95%    Physical Exam Constitutional:      General: She is not in acute distress.    Appearance: She is obese. She is ill-appearing.  Cardiovascular:     Rate and Rhythm: Regular rhythm. Tachycardia present.     Heart sounds: Normal heart sounds.  Abdominal:     General: Bowel sounds are normal. There is no distension.     Palpations: Abdomen is soft.     Tenderness: There is no abdominal tenderness. There is no guarding or rebound.  Skin:    General: Skin is dry.     Capillary Refill: Capillary refill takes less than 2 seconds.  Neurological:     Mental Status: She is alert and oriented to person, place, and time.  Psychiatric:        Mood and Affect: Mood normal. Mood is not anxious or  depressed.        Behavior: Behavior normal.      Labs on Admission: I have personally reviewed following labs and imaging studies  CBC: Recent Labs  Lab 01/26/24 1720  WBC 12.9*  HGB 16.0*  HCT 49.3*  MCV 87.6  PLT 595*   Basic Metabolic  Panel: Recent Labs  Lab 01/26/24 1720  NA 138  K 3.1*  CL 96*  CO2 26  GLUCOSE 135*  BUN 10  CREATININE 0.77  CALCIUM 10.4*   GFR: CrCl cannot be calculated (Unknown ideal weight.). Liver Function Tests: Recent Labs  Lab 01/26/24 1720  AST 21  ALT 21  ALKPHOS 86  BILITOT 1.1  PROT 8.6*  ALBUMIN 5.0   Recent Labs  Lab 01/26/24 1720  LIPASE 28   No results for input(s): AMMONIA in the last 168 hours. Coagulation Profile: No results for input(s): INR, PROTIME in the last 168 hours. Cardiac Enzymes: No results for input(s): CKTOTAL, CKMB, CKMBINDEX, TROPONINI, TROPONINIHS in the last 168 hours. BNP (last 3 results) No results for input(s): BNP in the last 8760 hours. HbA1C: No results for input(s): HGBA1C in the last 72 hours. CBG: No results for input(s): GLUCAP in the last 168 hours. Lipid Profile: No results for input(s): CHOL, HDL, LDLCALC, TRIG, CHOLHDL, LDLDIRECT in the last 72 hours. Thyroid Function Tests: No results for input(s): TSH, T4TOTAL, FREET4, T3FREE, THYROIDAB in the last 72 hours. Anemia Panel: No results for input(s): VITAMINB12, FOLATE, FERRITIN, TIBC, IRON, RETICCTPCT in the last 72 hours. Urine analysis:    Component Value Date/Time   COLORURINE AMBER (A) 01/26/2024 1918   APPEARANCEUR CLOUDY (A) 01/26/2024 1918   LABSPEC 1.034 (H) 01/26/2024 1918   PHURINE 5.0 01/26/2024 1918   GLUCOSEU NEGATIVE 01/26/2024 1918   HGBUR SMALL (A) 01/26/2024 1918   HGBUR negative 07/31/2008 1123   BILIRUBINUR NEGATIVE 01/26/2024 1918   KETONESUR NEGATIVE 01/26/2024 1918   PROTEINUR 100 (A) 01/26/2024 1918   UROBILINOGEN 0.2 10/11/2012 0342   NITRITE NEGATIVE 01/26/2024 1918   LEUKOCYTESUR NEGATIVE 01/26/2024 1918    Radiological Exams on Admission: I have personally reviewed images CT ABDOMEN PELVIS W CONTRAST Result Date: 01/26/2024 EXAM: CT ABDOMEN AND PELVIS WITH CONTRAST 12/07/2022  TECHNIQUE: CT of the abdomen and pelvis was performed with the administration of 100 mL of iohexol  (OMNIPAQUE ) 300 MG/ML solution. Multiplanar reformatted images are provided for review. Automated exposure control, iterative reconstruction, and/or weight-based adjustment of the mA/kV was utilized to reduce the radiation dose to as low as reasonably achievable. COMPARISON: None available. CLINICAL HISTORY: Abdominal pain, acute, nonlocalized. Per chart: PT reports abdominal pain with n/v that started Saturday. Pt reports she took her dose of Zepbound on Friday. Sent by PCP for pancreatitis r/o. FINDINGS: LOWER CHEST: No acute abnormality. LIVER: Stable enhancing mass in the right hepatic dome measuring 6.3 cm with central scar consistent with benign focal nodular hyperplasia. GALLBLADDER AND BILE DUCTS: Gallbladder is unremarkable. No biliary ductal dilatation. SPLEEN: No acute abnormality. PANCREAS: No acute abnormality. ADRENAL GLANDS: No acute abnormality. KIDNEYS, URETERS AND BLADDER: No stones in the kidneys or ureters. No hydronephrosis. No perinephric or periureteral stranding. Urinary bladder is unremarkable. GI AND BOWEL: Stomach demonstrates no acute abnormality. There is no bowel obstruction. PERITONEUM AND RETROPERITONEUM: No ascites. No free air. VASCULATURE: Aorta is normal in caliber. LYMPH NODES: Several right iliac nodes measuring up to 2.7 cm, a stable finding. REPRODUCTIVE ORGANS: No acute abnormality. BONES AND SOFT TISSUES: No acute osseous abnormality. No focal  soft tissue abnormality. IMPRESSION: 1. Stable enhancing mass in the right hepatic dome measuring 6.3 cm with central scar, consistent with benign focal nodular hyperplasia. 2. Several right iliac lymph nodes measuring up to 2.7 cm, stable. Electronically signed by: Fonda Field MD 01/26/2024 08:00 PM EDT RP Workstation: [id number]     EKG: My personal interpretation of EKG shows: Sinus tachycardia heart rate 110 and borderline  prolonged QTc 487 ms.    Assessment/Plan: Principal Problem:   Intractable nausea and vomiting Active Problems:   Hypokalemia   Anxiety associated with depression   ADHD   Essential hypertension   GERD (gastroesophageal reflux disease)   IBS   Asthma   Fibromyalgia   Bipolar 2 disorder (HCC)   Chronic tachycardia   Prolonged Q-T interval on ECG    Assessment and Plan: Intractable nausea and vomiting -Presented emergency department with intractable nausea vomiting after patient received Zepbound and Mounjaro injection outpatient.  Outpatient PCP concern of development of pancreatitis referred to ED for evaluation. Patient denies any fever, chill, abdominal pain constipation and diarrhea. - At presentation to ED patient found tachycardic and borderline hypertensive.  EKG showing sinus tachycardia with borderline prolonged Qtc. CBC showing mild leukocytosis which is reactive.  CMP no evidence of transaminitis and normal lipase level.  Physical exam no abdominal pain on palpation. - CT abdomen pelvis no evidence of acute pancreatitis.  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 At this time there is no concern for acute pancreatitis. - In the ED patient received 1 L of NS bolus, Zofran  4 mg x 2 and Phenergan  12.5 mg. -Starting Reglan  10 mg 3 times daily and in the setting of prolonged QTc will avoid Benadryl /Zofran /Compazine .  Continue Tigan 200 mg every 6 hour as needed for nausea vomiting. -Continue maintenance fluid LR and D5 75 cc/h.   Prolonged QTc interval Prolonged QTc.   Continue cardiac monitoring.  Morbid obesity -On Zepbound and Mounjaro injection outpatient.  Essential hypertension Chronic sinus tachycardia -Continue home Toprol -XL 75 mg twice daily  Hypokalemia -Low potassium 3.  Replating with IV KCl 40 mEq total  History of GERD -Continue Protonix    Generalized anxiety disorder History of depression History of bipolar  disorder history of obsessive-compulsive disorder History of alcohol use and Substance use - Checking UDS and alcohol level - Continue home medications include Abilify  75 mg daily, Prozac  40 mg daily, Atarax  daily as needed for anxiety, lamotrigine  100 mg at bedtime.  History of asthma - Continue Xopenex as needed  History of ADHD -In the setting of tachycardia holding Vyvanse .    DVT prophylaxis:  Lovenox Code Status:  Full Code Diet: Heart healthy diet Family Communication:   Family was present at bedside, at the time of interview. Opportunity was given to ask question and all questions were answered satisfactorily.  Disposition Plan: Continue to monitor improvement of nausea/vomiting. Consults: None indicated at this time Admission status:   Observation, Telemetry bed  Severity of Illness: The appropriate patient status for this patient is OBSERVATION. Observation status is judged to be reasonable and necessary in order to provide the required intensity of service to ensure the patient's safety. The patient's presenting symptoms, physical exam findings, and initial radiographic and laboratory data in the context of their medical condition is felt to place them at decreased risk for further clinical deterioration. Furthermore, it is anticipated that the patient will be medically stable for discharge from the hospital within 2 midnights of admission.  Kesia Dalto, MD [facility]  How to contact the [facility] Attending or Consulting provider 7A - 7P or covering provider during after hours 7P -7A, for this patient.  Check the care team in [facility] and look for a) attending/consulting [facility] provider listed and b) the [facility] team listed Log into [email or web address] and use [place]'s universal password to access. If you do not have the password, please contact the hospital operator. Locate the [facility] provider you are looking for under [facility] and page to a number that you can be  directly reached. If you still have difficulty reaching the provider, please page the [facility] (Director on Call) for the Hospitalists listed on amion for assistance.  01/26/2024, 10:03 PM

## 2024-01-26 NOTE — ED Provider Notes (Signed)
 [facility] AT [facility] Provider Note   CSN: [id number] Arrival date & time: 01/26/24  1649     Patient presents with: Abdominal Pain   Kristin Stephens is a 38 y.o. female.   Patient here with abdominal pain nausea and vomiting that started after her second and dose of Zepbound/maunjaro.  She was sent here by her primary care doctor to rule out pancreatitis or other complications.  She has been having a hard time keeping things down.  She denies any diarrhea.  She denies any chest pain or shortness of breath.  She denies any pain with urination.  She does have a history of IBS esophageal reflux anxiety depression bipolar disorder fibromyalgia polysubstance abuse.  Denies any fevers or chills.  The history is provided by the patient.       Prior to Admission medications   Medication Sig Start Date End Date Taking? Authorizing Provider  acetaminophen  (TYLENOL ) 500 MG tablet Take 1,000-2,000 mg by mouth every 6 (six) hours as needed for mild pain, moderate pain, fever or headache.    [provider]  ARIPiprazole  (ABILIFY ) 15 MG tablet Take 1 tablet (15 mg total) by mouth daily. 01/07/24   Arfeen, Leni DASEN, MD  diclofenac  Sodium (VOLTAREN ) 1 % GEL Apply topically 4 (four) times daily.    [provider]  FLUoxetine  (PROZAC ) 40 MG capsule Take 1 capsule (40 mg total) by mouth daily. 01/07/24   Arfeen, Leni DASEN, MD  fluticasone  (FLONASE ) 50 MCG/ACT nasal spray USE 2 SPRAYS IN EACH NOSTRIL EVERY DAY 09/21/17   Alphonsa Elsie RAMAN, MD  gabapentin  (NEURONTIN ) 100 MG capsule 1 capsule Orally three times a day for 30 days As needed for pain, caution sedation 12/23/22   [provider]  hydrOXYzine  (ATARAX ) 25 MG tablet Take 1 tablet (25 mg total) by mouth daily as needed for anxiety. 01/07/24   Arfeen, Syed T, MD  ibuprofen (ADVIL) 200 MG tablet Take 200 mg by mouth every 6 (six) hours as needed for mild pain or headache.    [provider]  lamoTRIgine  (LAMICTAL ) 100 MG tablet Take 1 tablet (100 mg total) by mouth at bedtime. 01/07/24   Arfeen, Syed T, MD  lisdexamfetamine (VYVANSE ) 40 MG capsule Take 1 capsule (40 mg total) by mouth daily. 02/04/24 03/05/24  Arfeen, Leni DASEN, MD  loratadine  (CLARITIN ) 10 MG tablet Take 1 tablet by mouth daily as needed for allergies.    [provider]  medroxyPROGESTERone  (DEPO-PROVERA ) 150 MG/ML injection Inject 150 mg into the muscle every 3 (three) months.    [provider]  metoprolol  succinate (TOPROL -XL) 50 MG 24 hr tablet Take 1 1/2 tablets (75 mg ) by mouth in the morning and 1 tablet (50 mg) in the evening. 06/08/23   Okey Vina GAILS, MD  ondansetron  (ZOFRAN -ODT) 4 MG disintegrating tablet Take 1 tablet (4 mg total) by mouth every 8 (eight) hours as needed for nausea or vomiting. 12/29/23   Rolinda Rogue, MD  pantoprazole  (PROTONIX ) 40 MG tablet Take 1 tablet (40 mg total) by mouth daily. 12/03/22   Barrett, Jamie N, PA-C    Allergies: Sulfonamide derivatives, Coconut (cocos nucifera), and Coconut flavoring agent (non-screening)    Review of Systems  Updated Vital Signs BP (!) 152/107   Pulse (!) 121   Temp 98.8 F (37.1 C) (Oral)   Resp 18   LMP  (LMP Unknown)   SpO2 98%   Physical Exam Vitals and nursing note  reviewed.  Constitutional:      General: She is not in acute distress.    Appearance: She is well-developed. She is not ill-appearing.  HENT:     Head: Normocephalic and atraumatic.     Mouth/Throat:     Mouth: Mucous membranes are moist.  Eyes:     Extraocular Movements: Extraocular movements intact.     Conjunctiva/sclera: Conjunctivae normal.     Pupils: Pupils are equal, round, and reactive to light.  Cardiovascular:     Rate and Rhythm: Normal rate and regular rhythm.     Heart sounds: Normal heart sounds. No murmur heard. Pulmonary:     Effort: Pulmonary effort is normal. No respiratory distress.     Breath sounds: Normal breath  sounds.  Abdominal:     General: Abdomen is flat.     Palpations: Abdomen is soft.     Tenderness: There is generalized abdominal tenderness.  Musculoskeletal:        General: No swelling.     Cervical back: Neck supple.  Skin:    General: Skin is warm and dry.     Capillary Refill: Capillary refill takes less than 2 seconds.  Neurological:     Mental Status: She is alert.  Psychiatric:        Mood and Affect: Mood normal.     (all labs ordered are listed, but only abnormal results are displayed) Labs Reviewed  COMPREHENSIVE METABOLIC PANEL WITH GFR - Abnormal; Notable for the following components:      Result Value   Potassium 3.1 (*)    Chloride 96 (*)    Glucose, Bld 135 (*)    Calcium 10.4 (*)    Total Protein 8.6 (*)    All other components within normal limits  CBC - Abnormal; Notable for the following components:   WBC 12.9 (*)    RBC 5.63 (*)    Hemoglobin 16.0 (*)    HCT 49.3 (*)    Platelets 595 (*)    All other components within normal limits  URINALYSIS, ROUTINE W REFLEX MICROSCOPIC - Abnormal; Notable for the following components:   Color, Urine AMBER (*)    APPearance CLOUDY (*)    Specific Gravity, Urine 1.034 (*)    Hgb urine dipstick SMALL (*)    Protein, ur 100 (*)    Bacteria, UA MANY (*)    All other components within normal limits  URINE CULTURE  LIPASE, BLOOD  HCG, SERUM, QUALITATIVE    EKG: None  Radiology: CT ABDOMEN PELVIS W CONTRAST Result Date: 01/26/2024 EXAM: CT ABDOMEN AND PELVIS WITH CONTRAST 12/07/2022 TECHNIQUE: CT of the abdomen and pelvis was performed with the administration of 100 mL of iohexol  (OMNIPAQUE ) 300 MG/ML solution. Multiplanar reformatted images are provided for review. Automated exposure control, iterative reconstruction, and/or weight-based adjustment of the mA/kV was utilized to reduce the radiation dose to as low as reasonably achievable. COMPARISON: None available. CLINICAL HISTORY: Abdominal pain, acute,  nonlocalized. Per chart: PT reports abdominal pain with n/v that started Saturday. Pt reports she took her dose of Zepbound on Friday. Sent by PCP for pancreatitis r/o. FINDINGS: LOWER CHEST: No acute abnormality. LIVER: Stable enhancing mass in the right hepatic dome measuring 6.3 cm with central scar consistent with benign focal nodular hyperplasia. GALLBLADDER AND BILE DUCTS: Gallbladder is unremarkable. No biliary ductal dilatation. SPLEEN: No acute abnormality. PANCREAS: No acute abnormality. ADRENAL GLANDS: No acute abnormality. KIDNEYS, URETERS AND BLADDER: No stones in the kidneys or ureters. No  hydronephrosis. No perinephric or periureteral stranding. Urinary bladder is unremarkable. GI AND BOWEL: Stomach demonstrates no acute abnormality. There is no bowel obstruction. PERITONEUM AND RETROPERITONEUM: No ascites. No free air. VASCULATURE: Aorta is normal in caliber. LYMPH NODES: Several right iliac nodes measuring up to 2.7 cm, a stable finding. REPRODUCTIVE ORGANS: No acute abnormality. BONES AND SOFT TISSUES: No acute osseous abnormality. No focal soft tissue abnormality. IMPRESSION: 1. Stable enhancing mass in the right hepatic dome measuring 6.3 cm with central scar, consistent with benign focal nodular hyperplasia. 2. Several right iliac lymph nodes measuring up to 2.7 cm, stable. Electronically signed by: Fonda Field MD 01/26/2024 08:00 PM EDT RP Workstation: [id number]     Procedures   Medications Ordered in the ED  ondansetron  (ZOFRAN ) injection 4 mg (4 mg Intravenous Given 01/26/24 1746)  sodium chloride  0.9 % bolus 1,000 mL (1,000 mLs Intravenous New Bag/Given 01/26/24 2038)  iohexol  (OMNIPAQUE ) 300 MG/ML solution 100 mL (100 mLs Intravenous Contrast Given 01/26/24 1947)  promethazine  (PHENERGAN ) 12.5 mg in sodium chloride  0.9 % 50 mL IVPB (12.5 mg Intravenous New Bag/Given 01/26/24 2029)  ondansetron  (ZOFRAN ) injection 4 mg (4 mg Intravenous Given 01/26/24 2039)  cefTRIAXone   (ROCEPHIN ) 2 g in sodium chloride  0.9 % 100 mL IVPB (2 g Intravenous New Bag/Given 01/26/24 2043)                                    Medical Decision Making Amount and/or Complexity of Data Reviewed Labs: ordered. Radiology: ordered.  Risk Prescription drug management. Decision regarding hospitalization.   Kristin Stephens is here with nausea and vomiting.  This seems to have started after her second dose of Mounjaro.  Normal vitals.  No fever.  History of IBS fibromyalgia polysubstance abuse.  Overall symptoms for the last several days.  Will evaluate with a differential of medication side effect, dehydration and UTI pancreatitis cholecystitis colitis.  Will get CBC CMP lipase urinalysis CT scan abdomen pelvis.  Will give IV fluids IV Zofran  IV Phenergan  and reevaluate.  Overall per my review and interpretation of labs no significant leukocytosis anemia or electrolyte abnormality.  Somewhat equivocal urinalysis for infection will give a dose of IV Rocephin .  CT scan showed no acute findings.  Despite multiple antiemetics she still vomiting.  I do think that this is likely medication side effect.  Will admit her for further supportive care she is unable to tolerate p.o. will get EKG and give further nausea meds.  Likely Compazine  and Benadryl .  This chart was dictated using voice recognition software.  Despite best efforts to proofread,  errors can occur which can change the documentation meaning.      Final diagnoses:  Intractable nausea and vomiting    ED Discharge Orders     None          Ruthe Cornet, DO 01/26/24 2120

## 2024-01-26 NOTE — ED Triage Notes (Signed)
 PT reports abdominal pain with n/v that started Saturday. Pt reports she took her dose of Zepbound on Friday. Sent by PCP for pancreatitis r/o.

## 2024-01-27 DIAGNOSIS — R112 Nausea with vomiting, unspecified: Secondary | ICD-10-CM | POA: Diagnosis not present

## 2024-01-27 DIAGNOSIS — Z7985 Long-term (current) use of injectable non-insulin antidiabetic drugs: Secondary | ICD-10-CM

## 2024-01-27 LAB — HIV ANTIBODY (ROUTINE TESTING W REFLEX): HIV Screen 4th Generation wRfx: NONREACTIVE

## 2024-01-27 LAB — COMPREHENSIVE METABOLIC PANEL WITH GFR
ALT: 18 U/L (ref 0–44)
AST: 21 U/L (ref 15–41)
Albumin: 4.1 g/dL (ref 3.5–5.0)
Alkaline Phosphatase: 67 U/L (ref 38–126)
Anion gap: 10 (ref 5–15)
BUN: 8 mg/dL (ref 6–20)
CO2: 25 mmol/L (ref 22–32)
Calcium: 9.1 mg/dL (ref 8.9–10.3)
Chloride: 101 mmol/L (ref 98–111)
Creatinine, Ser: 0.68 mg/dL (ref 0.44–1.00)
GFR, Estimated: >60 mL/min (ref >60)
Glucose, Bld: 108 mg/dL — ABNORMAL HIGH (ref 70–99)
Potassium: 3.5 mmol/L (ref 3.5–5.1)
Sodium: 136 mmol/L (ref 135–145)
Total Bilirubin: 0.9 mg/dL (ref 0.0–1.2)
Total Protein: 6.7 g/dL (ref 6.5–8.1)

## 2024-01-27 LAB — CBC
HCT: 41.4 % (ref 36.0–46.0)
Hemoglobin: 13.2 g/dL (ref 12.0–15.0)
MCH: 28.4 pg (ref 26.0–34.0)
MCHC: 31.9 g/dL (ref 30.0–36.0)
MCV: 89.2 fL (ref 80.0–100.0)
Platelets: 423 K/uL — ABNORMAL HIGH (ref 150–400)
RBC: 4.64 MIL/uL (ref 3.87–5.11)
RDW: 13.4 % (ref 11.5–15.5)
WBC: 10.3 K/uL (ref 4.0–10.5)
nRBC: 0 % (ref 0.0–0.2)

## 2024-01-27 LAB — ETHANOL: Alcohol, Ethyl (B): <15 mg/dL (ref <15)

## 2024-01-27 MED ORDER — SODIUM CHLORIDE 0.9 % IV BOLUS: 500.0000 mL | Freq: Once | INTRAVENOUS | Status: DC

## 2024-01-27 MED ORDER — POTASSIUM CHLORIDE 10 MEQ/100ML IV SOLN
10.0000 meq | INTRAVENOUS | Status: AC
  Administered 2024-01-27 (×2): 10 meq via INTRAVENOUS
  Filled 2024-01-27 (×2): qty 100

## 2024-01-27 MED ORDER — PROCHLORPERAZINE EDISYLATE 10 MG/2ML IJ SOLN
10.0000 mg | Freq: Four times a day (QID) | INTRAMUSCULAR | Status: DC | PRN
  Administered 2024-01-27 – 2024-01-28 (×3): 10 mg via INTRAVENOUS
  Filled 2024-01-27 (×3): qty 2

## 2024-01-27 MED ORDER — PANTOPRAZOLE SODIUM 40 MG IV SOLR
40.0000 mg | Freq: Two times a day (BID) | INTRAVENOUS | Status: DC
  Administered 2024-01-27 – 2024-01-28 (×3): 40 mg via INTRAVENOUS
  Filled 2024-01-27 (×3): qty 10

## 2024-01-27 MED ORDER — METOPROLOL SUCCINATE ER 50 MG PO TB24
75.0000 mg | ORAL_TABLET | Freq: Every day | ORAL | Status: DC
  Filled 2024-01-27: qty 1

## 2024-01-27 MED ORDER — POTASSIUM CHLORIDE 10 MEQ/100ML IV SOLN
10.0000 meq | INTRAVENOUS | Status: AC
  Administered 2024-01-27: 10 meq via INTRAVENOUS
  Filled 2024-01-27: qty 100

## 2024-01-27 NOTE — Consult Note (Addendum)
 Consultation  Referring Provider:  [facility]  Primary Care Physician:  Shepard Ade, MD Primary Gastroenterologist:  Dr. Stacia       Reason for Consultation:     Nausea, vomiting, melena  LOS: 0 days          HPI:   Kristin Stephens is a 38 y.o. female with past medical history significant for obesity, anxiety, depression, asthma, fibromyalgia, eczema, IBS, migraine, OCD, presents for evaluation of nausea and vomiting  Patient presents to ED referred by PCP for concern for pancreatitis.  Patient was recently put on Zepbound.  She took her first dose 2 weeks ago without any issues and then took her second dose last Friday (10/24) and developed severe nausea and vomiting Sunday (10/26).  She states she has not been able to keep anything down since 10/26.  She took Pepto-Bismol to try to help which then turned her stool black.  Last bowel movement was Monday 10/27.  Due to persistent nausea/vomiting she reports some reflux and epigastric pain and feels overall soreness in her throat.  Upon arrival CT abdomen pelvis with contrast shows stable mass in right hepatic dome measuring 6.3 cm consistent with benign focal nodular hyperplasia and stable iliac lymph nodes.  She was given ceftriaxone  for concern for UTI as well as 1L of LR bolus, Zofran , Phenergan   Lipase 28, improvement in leukocytosis (10.3), platelets 423, likely reactive.  Corrected potassium now 3.5 from 3.1   PREVIOUS GI WORKUP   EGD 2008 Indication: Chest pain Normal   EGD January 2010 Normal esophagus, mild antral erythema, normal duodenum    1. DUODENUM, BIOPSY: BENIGN SMALL BOWEL MUCOSA. NO VILLOUS   ATROPHY, INFLAMMATION OR OTHER ABNORMALITIES PRESENT.    2. STOMACH, BIOPSY: MILD CHRONIC GASTRITIS, NO EVIDENCE OF   HELICOBACTER PYLORI, INTESTINAL METAPLASIA, DYSPLASIA OR   MALIGNANCY.    Colonoscopy February 2015 (Dr. Harvey) Redundant colon, but otherwise normal Large internal hemorrhoids, 3 bands  placed     CT abdomen/pelvis September 2024  indication: Epigastric pain, nausea/vomiting FINDINGS: Lower Chest: No acute findings.   Hepatobiliary: Stable hypervascular mass with central scar in the anterior liver dome measuring approximately 6.6 cm, consistent with benign focal nodular hyperplasia. Gallbladder is unremarkable. No evidence of biliary ductal dilatation.   Pancreas:  No mass or inflammatory changes.   Spleen: Within normal limits in size and appearance.   Adrenals/Urinary Tract: No suspicious masses identified. No evidence of ureteral calculi or hydronephrosis.   Stomach/Bowel: No evidence of obstruction, inflammatory process or abnormal fluid collections. Normal appendix visualized.   Vascular/Lymphatic: No pathologically enlarged lymph nodes. No acute vascular findings.   Reproductive:  No mass or other significant abnormality.   Other:  None.   Musculoskeletal:  No suspicious bone lesions identified.   IMPRESSION: No acute findings within the abdomen or pelvis.   Stable benign focal nodular hyperplasia in the liver dome     MRI liver 2013 IMPRESSION:  7.0 cm enhancing lesion in the central hepatic dome, corresponding  to the sonographic abnormality, with imaging features  characteristic of focal nodular hyperplasia (FNH)       Past Medical History:  Diagnosis Date   Abnormal Pap smear of cervix    age 51 with hx of cryotherapy   ADHD (attention deficit hyperactivity disorder)    Anxiety    Asthma    daily and prn inhalers   Bipolar affective disorder (HCC)    COVID 05/2020   Dental crowns  present    Depression    Deviated nasal septum 05/2012   Eczema    right leg   Esophageal reflux    Fibromyalgia    Focal nodular hyperplasia of liver    Heart murmur    Hypertension    IBS (irritable bowel syndrome)    Migraine headache    with aura   Nasal turbinate hypertrophy 05/2012   bilateral   OCD (obsessive compulsive disorder)     STD (sexually transmitted disease)    Hx of abnormal pap--age 18    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     Surgical History:  She  has a past surgical history that includes 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 Family History:  Her family history includes ADD / ADHD in her brother and sister; Alcohol abuse in her father; Anxiety disorder in her brother; Bipolar disorder in her father and paternal grandmother; Cancer in her mother; Dementia in her maternal grandmother and paternal grandmother; Depression in her mother; Emphysema in her paternal grandmother; Heart disease in her maternal grandmother and paternal grandmother; Hyperlipidemia in her mother; Hypertension in her mother; Personality disorder in her father; Physical abuse in her mother; Prostate cancer in her maternal grandfather; Seizures in her daughter; Sexual abuse in her mother; Sleep apnea in her mother; Stroke in her maternal grandfather and mother; Thyroid disease in her maternal grandfather. Social History:   reports that she has been smoking. She uses smokeless tobacco. She reports that she does not drink alcohol and does not use drugs.  Prior to Admission medications   Medication Sig Start Date End Date Taking? Authorizing Provider  acetaminophen  (TYLENOL ) 500 MG tablet Take 1,000-2,000 mg by mouth every 6 (six) hours as needed for mild pain, moderate pain, fever or headache.   Yes [provider]  ARIPiprazole  (ABILIFY ) 15 MG tablet Take 1 tablet (15 mg total) by mouth daily. 01/07/24  Yes Arfeen, Leni DASEN, MD  FLUoxetine  (PROZAC ) 40 MG capsule Take 1 capsule  (40 mg total) by mouth daily. 01/07/24  Yes Arfeen, Leni DASEN, MD  fluticasone  (FLONASE ) 50 MCG/ACT nasal spray USE 2 SPRAYS IN EACH NOSTRIL EVERY DAY Patient taking differently: Place 2 sprays into both nostrils daily as needed. 09/21/17  Yes Alphonsa Elsie RAMAN, MD  gabapentin  (NEURONTIN ) 100 MG capsule Take 100 mg by mouth 3 (three) times daily as needed. 12/23/22  Yes [provider]  hydrOXYzine  (ATARAX ) 25 MG tablet Take 1 tablet (25 mg total) by mouth daily as needed for anxiety. 01/07/24  Yes Arfeen, Leni DASEN, MD  ibuprofen (ADVIL) 200 MG tablet Take 200 mg by mouth every 6 (six) hours as needed for mild pain or headache.   Yes [provider]  lamoTRIgine  (LAMICTAL ) 100 MG tablet Take 1 tablet (100 mg total) by mouth at bedtime. 01/07/24  Yes Arfeen, Syed T, MD  lisdexamfetamine (VYVANSE ) 40 MG capsule Take 1 capsule (40 mg total) by mouth daily. 02/04/24 03/05/24 Yes Arfeen, Leni DASEN, MD  medroxyPROGESTERone  (DEPO-PROVERA ) 150 MG/ML injection Inject 150 mg into the muscle every 3 (three) months.   Yes [provider]  metoprolol  succinate (TOPROL -XL) 50 MG 24 hr tablet Take 1 1/2 tablets (75 mg ) by mouth in the morning and  1 tablet (50 mg) in the evening. Patient taking differently: Take 50 mg by mouth in the morning and at bedtime. 06/08/23  Yes Okey Vina GAILS, MD  ondansetron  (ZOFRAN -ODT) 4 MG disintegrating tablet Take 1 tablet (4 mg total) by mouth every 8 (eight) hours as needed for nausea or vomiting. 12/29/23  Yes Rolinda Rogue, MD  pantoprazole  (PROTONIX ) 40 MG tablet Take 1 tablet (40 mg total) by mouth daily. 12/03/22  Yes Barrett, Warren SAILOR, PA-C    Current Facility-Administered Medications  Medication Dose Route Frequency Provider Last Rate Last Admin   0.9 %  sodium chloride  infusion  250 mL Intravenous PRN Sundil, Subrina, MD       acetaminophen  (TYLENOL ) tablet 650 mg  650 mg Oral Q6H PRN Sundil, Subrina, MD   650 mg at 01/27/24 0251   Or   acetaminophen  (TYLENOL )  suppository 650 mg  650 mg Rectal Q6H PRN Sundil, Subrina, MD       alum & mag hydroxide-simeth (MAALOX/MYLANTA) 200-200-20 MG/5ML suspension 15 mL  15 mL Oral Q6H PRN Sundil, Subrina, MD       ARIPiprazole  (ABILIFY ) tablet 15 mg  15 mg Oral Daily Sundil, Subrina, MD       dextrose  5 % in lactated ringers  infusion   Intravenous Continuous Regalado, Belkys A, MD 75 mL/hr at 01/26/24 2258 New Bag at 01/26/24 2258   docusate sodium (COLACE) capsule 100 mg  100 mg Oral BID Sundil, Subrina, MD   100 mg at 01/26/24 2252   enoxaparin (LOVENOX) injection 40 mg  40 mg Subcutaneous Q24H Sundil, Subrina, MD   40 mg at 01/26/24 2306   FLUoxetine  (PROZAC ) capsule 40 mg  40 mg Oral Daily Sundil, Subrina, MD       hydrOXYzine  (ATARAX ) tablet 25 mg  25 mg Oral Daily PRN Sundil, Subrina, MD       lamoTRIgine  (LAMICTAL ) tablet 100 mg  100 mg Oral QHS Sundil, Subrina, MD   100 mg at 01/26/24 2252   levalbuterol (XOPENEX) nebulizer solution 0.63 mg  0.63 mg Nebulization Q6H PRN Sundil, Subrina, MD       metoCLOPramide  (REGLAN ) injection 5 mg  5 mg Intravenous Q8H Sundil, Subrina, MD   5 mg at 01/27/24 9471   metoprolol  succinate (TOPROL -XL) 24 hr tablet 75 mg  75 mg Oral BID Sundil, Subrina, MD   75 mg at 01/26/24 2254   pantoprazole  (PROTONIX ) injection 40 mg  40 mg Intravenous Q12H Regalado, Belkys A, MD       potassium chloride  10 mEq in 100 mL IVPB  10 mEq Intravenous Q1 Hr x 3 Regalado, Belkys A, MD       sodium chloride  0.9 % bolus 500 mL  500 mL Intravenous Once Regalado, Belkys A, MD       sodium chloride  flush (NS) 0.9 % injection 3 mL  3 mL Intravenous Q12H Sundil, Subrina, MD       sodium chloride  flush (NS) 0.9 % injection 3 mL  3 mL Intravenous Q12H Sundil, Subrina, MD       sodium chloride  flush (NS) 0.9 % injection 3 mL  3 mL Intravenous PRN Sundil, Subrina, MD       trimethobenzamide PHYLLISTINE) injection 200 mg  200 mg Intramuscular Q6H PRN Sundil, Subrina, MD   200 mg at 01/27/24 9082   Current  Outpatient Medications  Medication Sig Dispense Refill   acetaminophen  (TYLENOL ) 500 MG tablet Take 1,000-2,000 mg by mouth every 6 (six) hours as needed for mild  pain, moderate pain, fever or headache.     ARIPiprazole  (ABILIFY ) 15 MG tablet Take 1 tablet (15 mg total) by mouth daily. 90 tablet 0   FLUoxetine  (PROZAC ) 40 MG capsule Take 1 capsule (40 mg total) by mouth daily. 90 capsule 0   fluticasone  (FLONASE ) 50 MCG/ACT nasal spray USE 2 SPRAYS IN EACH NOSTRIL EVERY DAY (Patient taking differently: Place 2 sprays into both nostrils daily as needed.) 16 g 5   gabapentin  (NEURONTIN ) 100 MG capsule Take 100 mg by mouth 3 (three) times daily as needed.     hydrOXYzine  (ATARAX ) 25 MG tablet Take 1 tablet (25 mg total) by mouth daily as needed for anxiety. 90 tablet 0   ibuprofen (ADVIL) 200 MG tablet Take 200 mg by mouth every 6 (six) hours as needed for mild pain or headache.     lamoTRIgine  (LAMICTAL ) 100 MG tablet Take 1 tablet (100 mg total) by mouth at bedtime. 90 tablet 0   [START ON 02/04/2024] lisdexamfetamine (VYVANSE ) 40 MG capsule Take 1 capsule (40 mg total) by mouth daily. 30 capsule 0   medroxyPROGESTERone  (DEPO-PROVERA ) 150 MG/ML injection Inject 150 mg into the muscle every 3 (three) months.     metoprolol  succinate (TOPROL -XL) 50 MG 24 hr tablet Take 1 1/2 tablets (75 mg ) by mouth in the morning and 1 tablet (50 mg) in the evening. (Patient taking differently: Take 50 mg by mouth in the morning and at bedtime.)     ondansetron  (ZOFRAN -ODT) 4 MG disintegrating tablet Take 1 tablet (4 mg total) by mouth every 8 (eight) hours as needed for nausea or vomiting. 15 tablet 0   pantoprazole  (PROTONIX ) 40 MG tablet Take 1 tablet (40 mg total) by mouth daily. 30 tablet 0    Allergies as of 01/26/2024 - Review Complete 01/26/2024  Allergen Reaction Noted   Sulfonamide derivatives Swelling and Other (See Comments)    Coconut (cocos nucifera) Dermatitis and Rash 07/09/2018    Review of  Systems  Constitutional:  Negative for chills, fever and weight loss.  HENT:  Negative for hearing loss.   Eyes:  Negative for blurred vision and double vision.  Respiratory:  Negative for cough and hemoptysis.   Cardiovascular:  Negative for chest pain and palpitations.  Gastrointestinal:  Positive for abdominal pain, constipation, nausea and vomiting. Negative for blood in stool, diarrhea, heartburn and melena.  Genitourinary:  Negative for dysuria and urgency.  Musculoskeletal:  Negative for myalgias and neck pain.  Skin:  Negative for itching and rash.  Neurological:  Negative for seizures and loss of consciousness.  Psychiatric/Behavioral:  Negative for depression and suicidal ideas.        Physical Exam:  Vital signs in last 24 hours: Temp:  [98 F (36.7 C)-98.8 F (37.1 C)] 98.1 F (36.7 C) (10/29 0754) Pulse Rate:  [71-121] 78 (10/29 0754) Resp:  [18-25] 20 (10/29 0754) BP: (127-152)/(83-107) 137/95 (10/29 0754) SpO2:  [94 %-98 %] 97 % (10/29 0752) Last BM Date : 01/26/24 Last BM recorded by nurses in past 5 days No data recorded  Physical Exam Constitutional:      Appearance: She is ill-appearing.  HENT:     Head: Normocephalic and atraumatic.     Nose: Nose normal. No congestion.     Mouth/Throat:     Mouth: Mucous membranes are moist.     Pharynx: Oropharynx is clear.  Eyes:     General: No scleral icterus.    Extraocular Movements: Extraocular movements intact.  Cardiovascular:  Rate and Rhythm: Normal rate and regular rhythm.  Pulmonary:     Effort: Pulmonary effort is normal.     Breath sounds: Normal breath sounds.  Abdominal:     General: Bowel sounds are normal.     Palpations: Abdomen is soft.     Tenderness: There is abdominal tenderness (generalized).  Musculoskeletal:        General: No swelling. Normal range of motion.     Cervical back: Normal range of motion and neck supple.  Skin:    General: Skin is warm and dry.  Neurological:      General: No focal deficit present.     Mental Status: She is alert and oriented to person, place, and time.  Psychiatric:        Mood and Affect: Mood normal.        Behavior: Behavior normal.        Thought Content: Thought content normal.        Judgment: Judgment normal.      LAB RESULTS: Recent Labs    01/26/24 1720 01/27/24 0529  WBC 12.9* 10.3  HGB 16.0* 13.2  HCT 49.3* 41.4  PLT 595* 423*   BMET Recent Labs    01/26/24 1720 01/27/24 0529  NA 138 136  K 3.1* 3.5  CL 96* 101  CO2 26 25  GLUCOSE 135* 108*  BUN 10 8  CREATININE 0.77 0.68  CALCIUM 10.4* 9.1   LFT Recent Labs    01/27/24 0529  PROT 6.7  ALBUMIN 4.1  AST 21  ALT 18  ALKPHOS 67  BILITOT 0.9   PT/INR No results for input(s): LABPROT, INR in the last 72 hours.  STUDIES: CT ABDOMEN PELVIS W CONTRAST Result Date: 01/26/2024 EXAM: CT ABDOMEN AND PELVIS WITH CONTRAST 12/07/2022 TECHNIQUE: CT of the abdomen and pelvis was performed with the administration of 100 mL of iohexol  (OMNIPAQUE ) 300 MG/ML solution. Multiplanar reformatted images are provided for review. Automated exposure control, iterative reconstruction, and/or weight-based adjustment of the mA/kV was utilized to reduce the radiation dose to as low as reasonably achievable. COMPARISON: None available. CLINICAL HISTORY: Abdominal pain, acute, nonlocalized. Per chart: PT reports abdominal pain with n/v that started Saturday. Pt reports she took her dose of Zepbound on Friday. Sent by PCP for pancreatitis r/o. FINDINGS: LOWER CHEST: No acute abnormality. LIVER: Stable enhancing mass in the right hepatic dome measuring 6.3 cm with central scar consistent with benign focal nodular hyperplasia. GALLBLADDER AND BILE DUCTS: Gallbladder is unremarkable. No biliary ductal dilatation. SPLEEN: No acute abnormality. PANCREAS: No acute abnormality. ADRENAL GLANDS: No acute abnormality. KIDNEYS, URETERS AND BLADDER: No stones in the kidneys or ureters. No  hydronephrosis. No perinephric or periureteral stranding. Urinary bladder is unremarkable. GI AND BOWEL: Stomach demonstrates no acute abnormality. There is no bowel obstruction. PERITONEUM AND RETROPERITONEUM: No ascites. No free air. VASCULATURE: Aorta is normal in caliber. LYMPH NODES: Several right iliac nodes measuring up to 2.7 cm, a stable finding. REPRODUCTIVE ORGANS: No acute abnormality. BONES AND SOFT TISSUES: No acute osseous abnormality. No focal soft tissue abnormality. IMPRESSION: 1. Stable enhancing mass in the right hepatic dome measuring 6.3 cm with central scar, consistent with benign focal nodular hyperplasia. 2. Several right iliac lymph nodes measuring up to 2.7 cm, stable. Electronically signed by: Fonda Field MD 01/26/2024 08:00 PM EDT RP Workstation: [id number]      Impression    Nausea/vomiting in the setting of Zepbound use Melena secondary to Pepto-Bismol use Normal lipase No anemia  CTAP with contrast with normal gallbladder, normal pancreas, known FNH Recently started Zepbound with nausea and vomiting starting after her second dose.  Suspect side effect from medication which likely induced gastroparesis resulting in her symptoms.  Suspect worsening reflux/epigastric pain secondary to profuse nausea/vomiting causing some esophagitis/gastritis.  No anemia and no overt bleeding (melena secondary to Pepto-Bismol use).  Reassuring CT scan.  No evidence of pancreatitis.  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   Bayley CHRISTELLA Blower  01/27/2024, 10:22  AM     Attending physician's note   I have taken history, reviewed the chart and examined the patient. I performed a substantive portion of this encounter, including complete performance of at least one of the key components, in conjunction with the APP. I agree with the Advanced Practitioner's note, impression and recommendations.   N/V likely d/t Zepbound. Neg CT AP except for FNH, neg EGD x 2 in past. Nl CBC, CMP nd lipase. Resolved. Chr abdo pain IBD-D  Plan: - Advance diet as tolerated.  - Stop Zepbound - Protonix  40 mg Bid x 4 weeks, then every day - Hold off on EGD since will be very low yield and pt feels much better. Can always get it done as outpatient if needed. - I have reassured patient and family. - FU GI as outpatient. - Please call if there is any change in clinical status..  Will sign off for now.   Anselm Bring, MD [facility] [phone]

## 2024-01-27 NOTE — Plan of Care (Signed)
  Problem: Nutrition: Goal: Adequate nutrition will be maintained Outcome: Progressing   Problem: Coping: Goal: Level of anxiety will decrease Outcome: Progressing   Problem: Pain Managment: Goal: General experience of comfort will improve and/or be controlled Outcome: Progressing

## 2024-01-27 NOTE — Progress Notes (Signed)
 PROGRESS NOTE    Kristin Stephens  FMW:[id number] DOB: Dec 12, 1985 DOA: 01/26/2024 PCP: Shepard Ade, MD   Brief Narrative: 38 year old with past medical history significant for morbid obesity, anxiety, depression, bipolar disorder, asthma, fibromyalgia, GERD, eczema, IBS, migraine and OCD presents referred by her PCP for concern of pancreatitis after she received Zepbound and Mounjaro.  Patient presents with intractable nausea, vomiting associated with pain.   Assessment & Plan:   Principal Problem:   Intractable nausea and vomiting Active Problems:   Hypokalemia   Anxiety associated with depression   ADHD   Essential hypertension   GERD (gastroesophageal reflux disease)   IBS   Asthma   Fibromyalgia   Bipolar 2 disorder (HCC)   Chronic tachycardia   Prolonged Q-T interval on ECG  1-IIntractable nausea and vomiting: -Could be in setting of medications, Zepbound Continue IV fluids.  IV protonix .  IV compazine  and IV reglan .  GI consulted, she reporter melena and  small amount of blood vomit content.   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  -FU out patient.   Long QT: -Monitor on Telemetry.  -replete electrolytes.  Repeat EKG in am.   Morbid obesity: -Life style modifications  Essential hypertension -Continue Toprol    Chronic tachycardia -On Toprol /  Hypokalemia -replete  GERD -IV PPI  Generalized anxiety disorder History of depression bipolar disorder, obsessive-compulsive disorder History of alcohol use and substance use -Continue home medications include Abilify  75 mg daily, Prozac  40 mg daily, Atarax  daily as needed for anxiety, lamotrigine  100 mg at bedtime.   History of asthma PRN nebulizer.   History of ADHD: Holding Vyvanse      Estimated body mass index is 37.77 kg/m as calculated from the following:   Height as of 01/07/24: 5' 5 (1.651 m).   Weight as of 01/07/24: 103 kg.   DVT prophylaxis:  SCD Code Status:  Family Communication: Disposition Plan:  Status is: Observation The patient remains OBS appropriate and will d/c before 2 midnights.    Consultants:  GI  Procedures:    Antimicrobials:    Subjective: Continues to have persistent vomiting, reports some abdominal pain.  She reports black stool.  She said that she is seeing a small amount of blood in the vomit content/  Objective: Vitals:   01/27/24 0435 01/27/24 0541 01/27/24 0752 01/27/24 0754  BP:   (!) 137/95 (!) 137/95  Pulse:   79 78  Resp:   (!) 25 20  Temp:  98 F (36.7 C)  98.1 F (36.7 C)  TempSrc:    Oral  SpO2: 95%  97%     Intake/Output Summary (Last 24 hours) at 01/27/2024 0912 Last data filed at 01/27/2024 0432 Gross per 24 hour  Intake 441.85 ml  Output --  Net 441.85 ml   There were no vitals filed for this visit.  Examination:  General exam: Appears calm and comfortable  Respiratory system: Clear to auscultation. Respiratory effort normal. Cardiovascular system: S1 & S2 heard, RRR. No JVD, murmurs, rubs, gallops or clicks. No pedal edema. Gastrointestinal system: Abdomen is nondistended, soft and mild tender Central nervous system: Alert and oriented. No focal neurological deficits. Extremities: Symmetric 5 x 5 power.   Data Reviewed: I have personally reviewed following labs and imaging studies  CBC: Recent Labs  Lab 01/26/24 1720 01/27/24 0529  WBC 12.9* 10.3  HGB 16.0* 13.2  HCT 49.3* 41.4  MCV 87.6 89.2  PLT 595* 423*   Basic Metabolic Panel:  Recent Labs  Lab 01/26/24 1720 01/27/24 0529  NA 138 136  K 3.1* 3.5  CL 96* 101  CO2 26 25  GLUCOSE 135* 108*  BUN 10 8  CREATININE 0.77 0.68  CALCIUM 10.4* 9.1   GFR: CrCl cannot be calculated (Unknown ideal weight.). Liver Function Tests: Recent Labs  Lab 01/26/24 1720 01/27/24 0529  AST 21 21  ALT 21 18  ALKPHOS 86 67  BILITOT 1.1 0.9  PROT 8.6* 6.7  ALBUMIN 5.0 4.1   Recent Labs  Lab  01/26/24 1720  LIPASE 28   No results for input(s): AMMONIA in the last 168 hours. Coagulation Profile: No results for input(s): INR, PROTIME in the last 168 hours. Cardiac Enzymes: No results for input(s): CKTOTAL, CKMB, CKMBINDEX, TROPONINI in the last 168 hours. BNP (last 3 results) No results for input(s): PROBNP in the last 8760 hours. HbA1C: No results for input(s): HGBA1C in the last 72 hours. CBG: No results for input(s): GLUCAP in the last 168 hours. Lipid Profile: No results for input(s): CHOL, HDL, LDLCALC, TRIG, CHOLHDL, LDLDIRECT in the last 72 hours. Thyroid Function Tests: No results for input(s): TSH, T4TOTAL, FREET4, T3FREE, THYROIDAB in the last 72 hours. Anemia Panel: No results for input(s): VITAMINB12, FOLATE, FERRITIN, TIBC, IRON, RETICCTPCT in the last 72 hours. Sepsis Labs: No results for input(s): PROCALCITON, LATICACIDVEN in the last 168 hours.  No results found for this or any previous visit (from the past 240 hours).       Radiology Studies: CT ABDOMEN PELVIS W CONTRAST Result Date: 01/26/2024 EXAM: CT ABDOMEN AND PELVIS WITH CONTRAST 12/07/2022 TECHNIQUE: CT of the abdomen and pelvis was performed with the administration of 100 mL of iohexol  (OMNIPAQUE ) 300 MG/ML solution. Multiplanar reformatted images are provided for review. Automated exposure control, iterative reconstruction, and/or weight-based adjustment of the mA/kV was utilized to reduce the radiation dose to as low as reasonably achievable. COMPARISON: None available. CLINICAL HISTORY: Abdominal pain, acute, nonlocalized. Per chart: PT reports abdominal pain with n/v that started Saturday. Pt reports she took her dose of Zepbound on Friday. Sent by PCP for pancreatitis r/o. FINDINGS: LOWER CHEST: No acute abnormality. LIVER: Stable enhancing mass in the right hepatic dome measuring 6.3 cm with central scar consistent with benign focal  nodular hyperplasia. GALLBLADDER AND BILE DUCTS: Gallbladder is unremarkable. No biliary ductal dilatation. SPLEEN: No acute abnormality. PANCREAS: No acute abnormality. ADRENAL GLANDS: No acute abnormality. KIDNEYS, URETERS AND BLADDER: No stones in the kidneys or ureters. No hydronephrosis. No perinephric or periureteral stranding. Urinary bladder is unremarkable. GI AND BOWEL: Stomach demonstrates no acute abnormality. There is no bowel obstruction. PERITONEUM AND RETROPERITONEUM: No ascites. No free air. VASCULATURE: Aorta is normal in caliber. LYMPH NODES: Several right iliac nodes measuring up to 2.7 cm, a stable finding. REPRODUCTIVE ORGANS: No acute abnormality. BONES AND SOFT TISSUES: No acute osseous abnormality. No focal soft tissue abnormality. IMPRESSION: 1. Stable enhancing mass in the right hepatic dome measuring 6.3 cm with central scar, consistent with benign focal nodular hyperplasia. 2. Several right iliac lymph nodes measuring up to 2.7 cm, stable. Electronically signed by: Fonda Field MD 01/26/2024 08:00 PM EDT RP Workstation: [id number]        Scheduled Meds:  ARIPiprazole   15 mg Oral Daily   docusate sodium  100 mg Oral BID   enoxaparin (LOVENOX) injection  40 mg Subcutaneous Q24H   FLUoxetine   40 mg Oral Daily   lamoTRIgine   100 mg Oral QHS   metoCLOPramide  (REGLAN ) injection  5 mg Intravenous Q8H   metoprolol  succinate  75 mg Oral BID   pantoprazole   40 mg Oral Daily   sodium chloride  flush  3 mL Intravenous Q12H   sodium chloride  flush  3 mL Intravenous Q12H   Continuous Infusions:  sodium chloride      dextrose  5% lactated ringers  75 mL/hr at 01/26/24 2258     LOS: 0 days    Time spent: 35 minutes    Brynnly Bonet A Elinda Bunten, MD [facility]   If 7PM-7AM, please contact night-coverage [email or web address]  01/27/2024, 9:12 AM

## 2024-01-28 DIAGNOSIS — R112 Nausea with vomiting, unspecified: Secondary | ICD-10-CM | POA: Diagnosis not present

## 2024-01-28 LAB — CBC
HCT: 42.8 % (ref 36.0–46.0)
Hemoglobin: 13.8 g/dL (ref 12.0–15.0)
MCH: 28.5 pg (ref 26.0–34.0)
MCHC: 32.2 g/dL (ref 30.0–36.0)
MCV: 88.4 fL (ref 80.0–100.0)
Platelets: 317 K/uL (ref 150–400)
RBC: 4.84 MIL/uL (ref 3.87–5.11)
RDW: 13.1 % (ref 11.5–15.5)
WBC: 7.2 K/uL (ref 4.0–10.5)
nRBC: 0 % (ref 0.0–0.2)

## 2024-01-28 LAB — BASIC METABOLIC PANEL WITH GFR
Anion gap: 12 (ref 5–15)
BUN: 6 mg/dL (ref 6–20)
CO2: 25 mmol/L (ref 22–32)
Calcium: 9.4 mg/dL (ref 8.9–10.3)
Chloride: 102 mmol/L (ref 98–111)
Creatinine, Ser: 0.68 mg/dL (ref 0.44–1.00)
GFR, Estimated: >60 mL/min (ref >60)
Glucose, Bld: 87 mg/dL (ref 70–99)
Potassium: 3.7 mmol/L (ref 3.5–5.1)
Sodium: 138 mmol/L (ref 135–145)

## 2024-01-28 LAB — URINE DRUG SCREEN
Amphetamines: NEGATIVE
Barbiturates: NEGATIVE
Benzodiazepines: NEGATIVE
Cocaine: NEGATIVE
Fentanyl: NEGATIVE
Methadone Scn, Ur: NEGATIVE
Opiates: NEGATIVE
Tetrahydrocannabinol: POSITIVE — AB

## 2024-01-28 MED ORDER — PROCHLORPERAZINE MALEATE 5 MG PO TABS: 5.0000 mg | ORAL_TABLET | Freq: Three times a day (TID) | ORAL | 0 refills | Status: DC | PRN

## 2024-01-28 MED ORDER — PANTOPRAZOLE SODIUM 40 MG PO TBEC: 40.0000 mg | DELAYED_RELEASE_TABLET | Freq: Two times a day (BID) | ORAL | 0 refills | Status: AC

## 2024-01-28 NOTE — Progress Notes (Signed)
   01/28/24 0833  TOC Brief Assessment  Insurance and Status Reviewed  Patient has primary care physician Yes  Home environment has been reviewed single family home  Prior level of function: independent  Prior/Current Home Services No current home services  Social Drivers of Health Review SDOH reviewed no interventions necessary  Readmission risk has been reviewed Yes  Transition of care needs no transition of care needs at this time    Signed: Heather Saltness, MSW, LCSW Clinical Social Worker Inpatient Care Management 01/28/2024 8:34 AM

## 2024-01-28 NOTE — Discharge Summary (Signed)
 Physician Discharge Summary   Patient: Kristin Stephens MRN: [id number] DOB: 1985/10/09  Admit date:     01/26/2024  Discharge date: 01/28/24  Discharge Physician: Owen DELENA Lore   PCP: Shepard Ade, MD   Recommendations at discharge:    Follow up with PCP post hospital follow up from gastroparesis. Consider different medication for weight loss.  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 *  Hospital Course: 38 year old with past medical history significant for morbid obesity, anxiety, depression, bipolar disorder, asthma, fibromyalgia, GERD, eczema, IBS, migraine and OCD presents referred by her PCP for concern of pancreatitis after she received Zepbound and Mounjaro.   Patient presents with intractable nausea, vomiting associated with pain  Assessment and Plan: 1-IIntractable nausea and vomiting: -Could be in setting of medications, Zepbound -Treated with  IV fluids.  IV protonix .  IV compazine  and IV reglan .  GI consulted, recommend support care.  She feels better, no further vomiting. Plan to discharge today if she tolerates diet.    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  -FU out patient.    Long QT: -Monitor on Telemetry.  -replete electrolytes.     Morbid obesity: -Life style modifications   Essential hypertension -Continue Toprol     Chronic tachycardia -On Toprol /   Hypokalemia -Replaced.    GERD - PPI   Generalized anxiety disorder History of depression bipolar disorder, obsessive-compulsive  disorder History of alcohol use and substance use -Continue home medications include Abilify  75 mg daily, Prozac  40 mg daily, Atarax  daily as needed for anxiety, lamotrigine  100 mg at bedtime.    History of asthma PRN nebulizer.    History of ADHD: Resume  Vyvanse           Consultants: GI Procedures performed: None Disposition: Home Diet recommendation:  Discharge Diet Orders (From admission, onward)     Start     Ordered   01/28/24 0000  Diet - low sodium heart healthy        01/28/24 1152           Carb modified diet DISCHARGE MEDICATION: Allergies as of 01/28/2024       Reactions   Sulfonamide Derivatives Swelling, Other (See Comments)   SWELLING OF EYES WITH OPHTHALMIC SULFA   Coconut (cocos Nucifera) Dermatitis, Rash        Medication List     STOP taking these medications    ibuprofen 200 MG tablet Commonly known as: ADVIL   ondansetron  4 MG disintegrating tablet Commonly known as: ZOFRAN -ODT       TAKE these medications    acetaminophen  500 MG tablet Commonly known as: TYLENOL  Take 1,000-2,000 mg by mouth every 6 (six) hours as needed for mild pain, moderate pain, fever or headache.   ARIPiprazole  15 MG tablet Commonly known as: ABILIFY  Take 1 tablet (15 mg total) by mouth daily.   FLUoxetine  40 MG capsule Commonly known as: PROZAC  Take 1 capsule (40 mg total) by mouth daily.   fluticasone  50 MCG/ACT nasal spray Commonly known as: FLONASE   USE 2 SPRAYS IN EACH NOSTRIL EVERY DAY What changed:  when to take this reasons to take this   gabapentin  100 MG capsule Commonly known as: NEURONTIN  Take 100 mg by mouth 3 (three) times daily as needed.   hydrOXYzine  25 MG tablet Commonly known as: ATARAX  Take 1 tablet (25 mg total) by mouth daily as needed for anxiety.   lamoTRIgine  100 MG tablet Commonly known as: LAMICTAL  Take 1 tablet (100 mg total) by mouth at bedtime.   lisdexamfetamine 40 MG capsule Commonly known as:  VYVANSE  Take 1 capsule (40 mg total) by mouth daily. Start taking on: February 04, 2024   medroxyPROGESTERone  150 MG/ML injection Commonly known as: DEPO-PROVERA  Inject 150 mg into the muscle every 3 (three) months.   metoprolol  succinate 50 MG 24 hr tablet Commonly known as: TOPROL -XL Take 1 1/2 tablets (75 mg ) by mouth in the morning and 1 tablet (50 mg) in the evening. What changed:  how much to take how to take this when to take this additional instructions   pantoprazole  40 MG tablet Commonly known as: Protonix  Take 1 tablet (40 mg total) by mouth 2 (two) times daily. What changed: when to take this   prochlorperazine  5 MG tablet Commonly known as: COMPAZINE  Take 1 tablet (5 mg total) by mouth every 8 (eight) hours as needed for up to 3 days for nausea or vomiting.        Discharge Exam: There were no vitals filed for this visit. General NAD  Condition at discharge: stable  The results of significant diagnostics from this hospitalization (including imaging, microbiology, ancillary and laboratory) are listed below for reference.   Imaging Studies: CT ABDOMEN PELVIS W CONTRAST Result Date: 01/26/2024 EXAM: CT ABDOMEN AND PELVIS WITH CONTRAST 12/07/2022 TECHNIQUE: CT of the abdomen and pelvis was performed with the administration of 100 mL of iohexol  (OMNIPAQUE ) 300 MG/ML solution. Multiplanar reformatted images are provided for review. Automated exposure control, iterative reconstruction, and/or weight-based adjustment of the mA/kV was utilized to reduce the radiation dose to as low as reasonably achievable. COMPARISON: None available. CLINICAL HISTORY: Abdominal pain, acute, nonlocalized. Per chart: PT reports abdominal pain with n/v that started Saturday. Pt reports she took her dose of Zepbound on Friday. Sent by PCP for pancreatitis r/o. FINDINGS: LOWER CHEST: No acute abnormality. LIVER: Stable enhancing mass in the right hepatic dome measuring 6.3 cm with central  scar consistent with benign focal nodular hyperplasia. GALLBLADDER AND BILE DUCTS: Gallbladder is unremarkable. No biliary ductal dilatation. SPLEEN: No acute abnormality. PANCREAS: No acute abnormality. ADRENAL GLANDS: No acute abnormality. KIDNEYS, URETERS AND BLADDER: No stones in the kidneys or ureters. No hydronephrosis. No perinephric or periureteral stranding. Urinary bladder is unremarkable. GI AND BOWEL: Stomach demonstrates no acute abnormality. There is no bowel obstruction. PERITONEUM AND RETROPERITONEUM: No ascites. No free air. VASCULATURE: Aorta is normal in caliber. LYMPH NODES: Several right iliac nodes measuring up to 2.7 cm, a stable finding. REPRODUCTIVE ORGANS: No acute abnormality. BONES AND SOFT TISSUES: No acute osseous abnormality. No focal soft tissue abnormality. IMPRESSION: 1. Stable enhancing mass in the right hepatic dome measuring 6.3 cm with central scar, consistent with benign focal nodular hyperplasia. 2. Several right iliac lymph nodes measuring up to 2.7 cm, stable. Electronically signed by: Fonda Field MD 01/26/2024 08:00 PM EDT RP Workstation: [id number]    Microbiology: Results for orders placed or performed during the hospital encounter of 12/03/22  Resp panel by RT-PCR (RSV, Flu A&B, Covid) Anterior Nasal Swab  Status: None   Collection Time: 12/03/22 11:31 AM   Specimen: Anterior Nasal Swab  Result Value Ref Range Status   SARS Coronavirus 2 by RT PCR NEGATIVE NEGATIVE Final    Comment: (NOTE) SARS-CoV-2 target nucleic acids are NOT DETECTED.  The SARS-CoV-2 RNA is generally detectable in upper respiratory specimens during the acute phase of infection. The lowest concentration of SARS-CoV-2 viral copies this assay can detect is 138 copies/mL. A negative result does not preclude SARS-Cov-2 infection and should not be used as the sole basis for treatment or other patient management decisions. A negative result may occur with  improper specimen  collection/handling, submission of specimen other than nasopharyngeal swab, presence of viral mutation(s) within the areas targeted by this assay, and inadequate number of viral copies(<138 copies/mL). A negative result must be combined with clinical observations, patient history, and epidemiological information. The expected result is Negative.  Fact Sheet for Patients:  bloggercourse.com  Fact Sheet for Healthcare Providers:  seriousbroker.it  This test is no t yet approved or cleared by the [place]  FDA and  has been authorized for detection and/or diagnosis of SARS-CoV-2 by FDA under an Emergency Use Authorization (EUA). This EUA will remain  in effect (meaning this test can be used) for the duration of the COVID-19 declaration under Section 564(b)(1) of the Act, 21 U.S.C.section 360bbb-3(b)(1), unless the authorization is terminated  or revoked sooner.       Influenza A by PCR NEGATIVE NEGATIVE Final   Influenza B by PCR NEGATIVE NEGATIVE Final    Comment: (NOTE) The Xpert Xpress SARS-CoV-2/FLU/RSV plus assay is intended as an aid in the diagnosis of influenza from Nasopharyngeal swab specimens and should not be used as a sole basis for treatment. Nasal washings and aspirates are unacceptable for Xpert Xpress SARS-CoV-2/FLU/RSV testing.  Fact Sheet for Patients: bloggercourse.com  Fact Sheet for Healthcare Providers: seriousbroker.it  This test is not yet approved or cleared by the [place]  FDA and has been authorized for detection and/or diagnosis of SARS-CoV-2 by FDA under an Emergency Use Authorization (EUA). This EUA will remain in effect (meaning this test can be used) for the duration of the COVID-19 declaration under Section 564(b)(1) of the Act, 21 U.S.C. section 360bbb-3(b)(1), unless the authorization is terminated or revoked.     Resp Syncytial  Virus by PCR NEGATIVE NEGATIVE Final    Comment: (NOTE) Fact Sheet for Patients: bloggercourse.com  Fact Sheet for Healthcare Providers: seriousbroker.it  This test is not yet approved or cleared by the [place]  FDA and has been authorized for detection and/or diagnosis of SARS-CoV-2 by FDA under an Emergency Use Authorization (EUA). This EUA will remain in effect (meaning this test can be used) for the duration of the COVID-19 declaration under Section 564(b)(1) of the Act, 21 U.S.C. section 360bbb-3(b)(1), unless the authorization is terminated or revoked.  Performed at [facility], [address], [place], [place] [address]    *Note: Due to a large number of results and/or encounters for the requested time period, some results have not been displayed. A complete set of results can be found in Results Review.    Labs: CBC: Recent Labs  Lab 01/26/24 1720 01/27/24 0529 01/28/24 0915  WBC 12.9* 10.3 7.2  HGB 16.0* 13.2 13.8  HCT 49.3* 41.4 42.8  MCV 87.6 89.2 88.4  PLT 595* 423* 317   Basic Metabolic Panel: Recent Labs  Lab 01/26/24 1720 01/27/24 0529 01/28/24 0915  NA 138 136 138  K 3.1*  3.5 3.7  CL 96* 101 102  CO2 26 25 25   GLUCOSE 135* 108* 87  BUN 10 8 6   CREATININE 0.77 0.68 0.68  CALCIUM 10.4* 9.1 9.4   Liver Function Tests: Recent Labs  Lab 01/26/24 1720 01/27/24 0529  AST 21 21  ALT 21 18  ALKPHOS 86 67  BILITOT 1.1 0.9  PROT 8.6* 6.7  ALBUMIN 5.0 4.1   CBG: No results for input(s): GLUCAP in the last 168 hours.  Discharge time spent: greater than 30 minutes.  Signed: Owen DELENA Lore, MD [facility] 01/28/2024

## 2024-01-28 NOTE — Plan of Care (Signed)
  Problem: Education: Goal: Knowledge of General Education information will improve Description: Including pain rating scale, medication(s)/side effects and non-pharmacologic comfort measures Outcome: Progressing   Problem: Clinical Measurements: Goal: Ability to maintain clinical measurements within normal limits will improve Outcome: Progressing Goal: Will remain free from infection Outcome: Progressing Goal: Diagnostic test results will improve Outcome: Progressing   Problem: Activity: Goal: Risk for activity intolerance will decrease Outcome: Progressing   Problem: Nutrition: Goal: Adequate nutrition will be maintained Outcome: Progressing   Problem: Coping: Goal: Level of anxiety will decrease Outcome: Progressing   

## 2024-01-29 LAB — URINE CULTURE: Culture: NO GROWTH

## 2024-02-05 ENCOUNTER — Telehealth (HOSPITAL_COMMUNITY): Payer: Self-pay | Admitting: *Deleted

## 2024-02-05 NOTE — Telephone Encounter (Signed)
 Pt called to advise that she has been discussing the use of Phentermine for weight loss, and was advised by PCP to discuss with you. Please review and advise.    Last visit: 01/07/24 Next visit: 04/08/24

## 2024-02-08 ENCOUNTER — Other Ambulatory Visit (HOSPITAL_COMMUNITY): Payer: Self-pay | Admitting: Psychiatry

## 2024-02-08 DIAGNOSIS — F900 Attention-deficit hyperactivity disorder, predominantly inattentive type: Secondary | ICD-10-CM

## 2024-02-08 NOTE — Telephone Encounter (Signed)
 She is positive for THC.  We need to have a discussion before prescribing Vyvanse .

## 2024-02-10 ENCOUNTER — Ambulatory Visit: Admitting: Obstetrics and Gynecology

## 2024-02-10 ENCOUNTER — Encounter: Payer: Self-pay | Admitting: Obstetrics and Gynecology

## 2024-02-10 VITALS — BP 104/70 | HR 92 | Ht 66.25 in | Wt 229.0 lb

## 2024-02-10 DIAGNOSIS — Z1331 Encounter for screening for depression: Secondary | ICD-10-CM | POA: Diagnosis not present

## 2024-02-10 DIAGNOSIS — Z3042 Encounter for surveillance of injectable contraceptive: Secondary | ICD-10-CM

## 2024-02-10 DIAGNOSIS — Z01419 Encounter for gynecological examination (general) (routine) without abnormal findings: Secondary | ICD-10-CM

## 2024-02-10 DIAGNOSIS — N9089 Other specified noninflammatory disorders of vulva and perineum: Secondary | ICD-10-CM | POA: Diagnosis not present

## 2024-02-10 MED ORDER — MEDROXYPROGESTERONE ACETATE 150 MG/ML IM SUSY
150.0000 mg | PREFILLED_SYRINGE | Freq: Once | INTRAMUSCULAR | Status: AC
  Administered 2024-02-10: 150 mg via INTRAMUSCULAR

## 2024-02-10 NOTE — Patient Instructions (Signed)

## 2024-02-10 NOTE — Progress Notes (Signed)
 38 y.o. [id number] Domestic Partner Caucasian female here for annual exam. Needs depo shot.    Not ready to do a tubal ligation.    No periods with Depo Provera .    Wants to continue.   Notes vulvar lesion for about 6 months.   Tried Zepbound and developed gastroparesis.   Received her flu vaccine.     PCP: Shepard Ade, MD   No LMP recorded. Patient has had an injection.           Sexually active: Yes.    The current method of family planning is Depo-Provera  injections.    Menopausal hormone therapy:  n/a Exercising: Yes.    Walking  Smoker:  yes  OB History  Gravida Para Term Preterm AB Living  1 1 1   1   SAB IAB Ectopic Multiple Live Births          # Outcome Date GA Lbr Len/2nd Weight Sex Type Anes PTL Lv  1 Term              HEALTH MAINTENANCE: Last 2 paps:  09/19/20 neg HR HPV neg, 10/16/14 neg  History of abnormal Pap or positive HPV:  yes, cryotherapy to cervix at age 89 yo Mammogram:   n/a Colonoscopy:  05/20/13  Bone Density:  n/a  Result  n/a   Immunization History  Administered Date(s) Administered   DTP 04/23/1988, 06/26/1988, 10/22/1988, 08/10/1989, 11/01/1993   HIB (PRP-OMP) 03/02/1989, 11/01/1993   Hepatitis B 12/23/1999, 01/21/2000, 06/09/2000   Hpv-Unspecified 02/13/2007   Influenza Split 01/28/2013   Influenza Whole 01/19/2008, 12/30/2010, 12/30/2011   Influenza,inj,Quad PF,6+ Mos 01/11/2016, 01/27/2017   Influenza-Unspecified 01/27/2012   MMR 08/10/1989, 11/01/1993   Meningococcal polysaccharide vaccine (MPSV4) 12/08/2007   OPV 04/23/1988, 06/26/1988, 08/10/1989, 11/01/1993   PFIZER(Purple Top)SARS-COV-2 Vaccination 06/21/2019, 07/12/2019, 02/07/2020   Pneumococcal Polysaccharide-23 01/19/2008, 01/27/2017   Td 10/07/2006, 02/07/2008   Tdap 12/02/2012   Zoster, Live 12/22/2011      reports that she has been smoking. She uses smokeless tobacco. She reports that she does not drink alcohol and does not use drugs.  Past Medical History:   Diagnosis Date   Abnormal Pap smear of cervix    age 86 with hx of cryotherapy   ADHD (attention deficit hyperactivity disorder)    Anxiety    Asthma    daily and prn inhalers   Bipolar affective disorder (HCC)    COVID 05/2020   Dental crowns present    Depression    Deviated nasal septum 05/2012   Eczema    right leg   Esophageal reflux    Fibromyalgia    Focal nodular hyperplasia of liver    Heart murmur    Hypertension    IBS (irritable bowel syndrome)    Migraine headache    with aura   Nasal turbinate hypertrophy 05/2012   bilateral   OCD (obsessive compulsive disorder)    STD (sexually transmitted disease)    Hx of abnormal pap--age 41    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     Past Surgical History:  Procedure Laterality Date   ANKLE RECONSTRUCTION Left 08/14/2021   Procedure: RECONSTRUCTION ANKLE Open debridement of left peroneal tendons;  Surgeon: Barton Drape, MD;  Location: [facility];  Service: Orthopedics;  Laterality: Left;   ASD AND VSD REPAIR  1989   CARDIAC CATHETERIZATION  06/18/2006   CESAREAN SECTION  04/12/2009   COARCTATION OF AORTA REPAIR  1989   COLONOSCOPY N/A 05/20/2013   Procedure: COLONOSCOPY;  Surgeon: Margo LITTIE Haddock, MD;  [facility];  Service: Endoscopy;  Laterality: N/A;  115-moved to 1230 Leigh Ann notified pt   ESOPHAGOGASTRODUODENOSCOPY  04/04/2008     Normal esophagus/Mild patchy erythema in the antrum/ Normal duodenal bulb, normal small bowel biopsy   FLEXIBLE SIGMOIDOSCOPY  04/04/2008     Small internal hemorrhoids (poor bowel prep)   HEMORRHOID BANDING N/A 05/20/2013   Procedure: HEMORRHOID BANDING;  Surgeon: Margo LITTIE Haddock, MD;  [facility];  Service: Endoscopy;  Laterality: N/A;   NASAL SEPTOPLASTY W/ TURBINOPLASTY Bilateral 05/31/2012   Procedure: NASAL SEPTOPLASTY WITH TURBINATE REDUCTION;  Surgeon: Ana LELON Moccasin, MD;  Location: [facility];   Service: ENT;  Laterality: Bilateral;    Current Outpatient Medications  Medication Sig Dispense Refill   acetaminophen  (TYLENOL ) 500 MG tablet Take 1,000-2,000 mg by mouth every 6 (six) hours as needed for mild pain, moderate pain, fever or headache.     ARIPiprazole  (ABILIFY ) 15 MG tablet Take 1 tablet (15 mg total) by mouth daily. 90 tablet 0   FLUoxetine  (PROZAC ) 40 MG capsule Take 1 capsule (40 mg total) by mouth daily. 90 capsule 0   fluticasone  (FLONASE ) 50 MCG/ACT nasal spray USE 2 SPRAYS IN EACH NOSTRIL EVERY DAY (Patient taking differently: Place 2 sprays into both nostrils daily as needed.) 16 g 5   gabapentin  (NEURONTIN ) 100 MG capsule Take 100 mg by mouth 3 (three) times daily as needed.     hydrOXYzine  (ATARAX ) 25 MG tablet Take 1 tablet (25 mg total) by mouth daily as needed for anxiety. 90 tablet 0   lamoTRIgine  (LAMICTAL ) 100 MG tablet Take 1 tablet (100 mg total) by mouth at bedtime. 90 tablet 0   lisdexamfetamine (VYVANSE ) 40 MG capsule Take 1 capsule (40 mg total) by mouth daily. 30 capsule 0   medroxyPROGESTERone  (DEPO-PROVERA ) 150 MG/ML injection Inject 150 mg into the muscle every 3 (three) months.     metoprolol  succinate (TOPROL -XL) 50 MG 24 hr tablet Take 1 1/2 tablets (75 mg ) by mouth in the morning and 1 tablet (50 mg) in the evening. (Patient taking differently: Take 50 mg by mouth in the morning and at bedtime.)     pantoprazole  (PROTONIX ) 40 MG tablet Take 1 tablet (40 mg total) by mouth 2 (two) times daily. 60 tablet 0   prochlorperazine  (COMPAZINE ) 5 MG tablet Take 1 tablet (5 mg total) by mouth every 8 (eight) hours as needed for up to 3 days for nausea or vomiting. 9 tablet 0   No current facility-administered medications for this visit.    ALLERGIES: Sulfonamide derivatives and Coconut (cocos nucifera)  Family History  Problem Relation Age of Onset   Cancer Mother        leukemia   Hyperlipidemia Mother    Hypertension Mother    Depression Mother     Physical abuse Mother    Sexual abuse Mother        possibly   Sleep apnea Mother    Stroke Mother    Personality disorder Father    Bipolar disorder Father    Alcohol abuse Father    ADD / ADHD Sister    Anxiety disorder Brother    ADD / ADHD Brother    Heart disease Maternal Grandmother    Dementia Maternal Grandmother    Stroke Maternal Grandfather    Prostate cancer Maternal Grandfather    Thyroid  disease Maternal Grandfather    Emphysema Paternal Grandmother    Heart disease Paternal Grandmother    Bipolar disorder Paternal Grandmother    Dementia Paternal Grandmother    Seizures Daughter    Drug abuse Neg Hx    OCD Neg Hx    Paranoid behavior Neg Hx    Schizophrenia Neg Hx     Review of Systems  All other systems reviewed and are negative.   PHYSICAL EXAM:  There were no vitals taken for this visit.    General appearance: alert, cooperative and appears stated age Head: normocephalic, without obvious abnormality, atraumatic Neck: no adenopathy, supple, symmetrical, trachea midline and thyroid normal to inspection and palpation Lungs: clear to auscultation bilaterally Breasts: normal appearance, no masses or tenderness, No nipple retraction or dimpling, No nipple discharge or bleeding, No axillary adenopathy Heart: regular rate and rhythm Abdomen: soft, non-tender; no masses, no organomegaly Extremities: extremities normal, atraumatic, no cyanosis or edema Skin: skin color, texture, turgor normal. No rashes or lesions Lymph nodes: cervical, supraclavicular, and axillary nodes normal. Neurologic: grossly normal  Pelvic: External genitalia:  perineal white coloration of tissue, thickened area 3 - 4 mm.               No abnormal inguinal nodes palpated.              Urethra:  normal appearing urethra with no masses, tenderness or lesions              Bartholins and Skenes: normal                 Vagina: normal appearing vagina with normal color and discharge, no  lesions              Cervix: no lesions              Pap taken: no Bimanual Exam:  Uterus:  normal size, contour, position, consistency, mobility, non-tender              Adnexa: no mass, fullness, tenderness            Chaperone was present for exam:  Kari HERO, CMA  ASSESSMENT: Well woman visit with gynecologic exam. Hepatic focal nodular hyperplasia.  Depo Provera  use.  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 Bipolar disorder. Hx PP depression.  PHQ-2-9: 0 Vulvar lesion.   PLAN: Mammogram screening discussed. Self breast awareness reviewed. Pap and HRV collected:  no, due in 2027. Guidelines for Calcium, Vitamin D, regular exercise program including cardiovascular and weight bearing exercise. Medication refills:  Depo Provera  150 gm IM q 3 months for one year.  She will work on bone health with calcium and vitamin D intake and weight bearing exercise.  Labs with PCP.  Return for vulvar biopsy.   Rational explained.  Follow up:  for yearly exams also.

## 2024-02-11 NOTE — Telephone Encounter (Signed)
 She can contact primary care about phentermine.  It requires regular follow-up of liver function test.  Sometimes phentermine can cause manic symptoms so she need to watch carefully about her mood symptoms if she decided to go on phentermine.

## 2024-02-11 NOTE — Telephone Encounter (Signed)
 I called the patient and relayed Dr. Genoveva message. Patient voiced her understanding.

## 2024-02-11 NOTE — Telephone Encounter (Signed)
 Pt verbalizes understanding

## 2024-02-11 NOTE — Telephone Encounter (Signed)
 Patient called this morning, I told her what Dr. Curry said about the Vyvanse . Patient said that is fine, but she is not asking about Vyvanse . Please see patients original question and advise, thank you

## 2024-02-29 ENCOUNTER — Telehealth (HOSPITAL_COMMUNITY): Payer: Self-pay

## 2024-02-29 DIAGNOSIS — F900 Attention-deficit hyperactivity disorder, predominantly inattentive type: Secondary | ICD-10-CM

## 2024-02-29 NOTE — Telephone Encounter (Signed)
 Patient is calling for a refill on Vyvanse , last filled on 11/6, patient has a follow up on 1/9

## 2024-03-01 ENCOUNTER — Encounter: Payer: Self-pay | Admitting: Internal Medicine

## 2024-03-01 DIAGNOSIS — I493 Ventricular premature depolarization: Secondary | ICD-10-CM

## 2024-03-01 NOTE — Telephone Encounter (Signed)
 Her findings are ventricular premature complexes and stimulant can cause and lead to arrhythmia.  We will not prescribe stimulants.

## 2024-03-01 NOTE — Telephone Encounter (Signed)
 Pt advised. She says cardiologist has cleared her previously but will reach out to them for f/u. Pt says that she uses Delta 9 Gummies PRN for pain r/t her Fibromyalgia as to not use narcotics. FYI. She has f/u scheduled for 04/08/24.

## 2024-03-01 NOTE — Telephone Encounter (Signed)
 I reviewed chart.  She is positive for cannabis on her last admission.  Her EKG shows ventricular premature complexes with increased QTc prolongation.  Unfortunately cannot prescribe stimulants.

## 2024-03-03 ENCOUNTER — Ambulatory Visit: Attending: Internal Medicine

## 2024-03-03 DIAGNOSIS — I493 Ventricular premature depolarization: Secondary | ICD-10-CM

## 2024-03-03 NOTE — Progress Notes (Unsigned)
 Enrolled patient for a 3 day Zio XT monitor to be mailed to patients home

## 2024-03-03 NOTE — Telephone Encounter (Signed)
 Set patient up for a 72 hour Zio patch   For PVCs Also-- please ask her what her BP has been runnign

## 2024-03-13 NOTE — Progress Notes (Unsigned)
 Cardiology Office Note   Date:  03/14/2024   ID:  Kristin Stephens, Kristin Stephens [birth year], MRN [id number]  PCP:  Shepard Ade, MD  Cardiologist:   Vina Gull, MD   F/U of  ASD/VSD/ coarctation of aorta  Presents for follow up    History of Present Illness: Kristin Stephens is a 38 y.o. female with a history of ASD/VSD/Coarctation.  She is s/p repair in 1989.  She also has a hx of Palpitations, bipolar disorder and ADD  Hx PUD in the past   Last echo 2022   LVEF and RVEF normal  Mild RAE.   No definite AI  Peak gradient down descending aorta 19 mm Hg.   Aorta 43 mm    Oct 2023   MRI of chest  Aortic root 39 mm   Asc aorta 35 mm    Stable since 2017   I saw the pt in clinic in Dec 2024    March 2025  Zio monitor showed SR with rare PACs, PVCs   Few short bursts of SVT.  Average HR 80   REcomm 75 mg Toprol  XL AM and 50 mg PM  October 2025  Seen in ER for hyperemesis (after GLP1)    EKG showed ST 110   QT mildly prolonged  Reading of multform PVCs is not correct    Since seen the pt denies CP   Breathing is good    Stays active    Has some heart racing   Usually at night or when not active   No dizziness  Just mailed in monitor    Outpatient Medications Prior to Visit  Medication Sig Dispense Refill   acetaminophen  (TYLENOL ) 500 MG tablet Take 1,000-2,000 mg by mouth every 6 (six) hours as needed for mild pain, moderate pain, fever or headache.     ARIPiprazole  (ABILIFY ) 15 MG tablet Take 1 tablet (15 mg total) by mouth daily. 90 tablet 0   FLUoxetine  (PROZAC ) 40 MG capsule Take 1 capsule (40 mg total) by mouth daily. 90 capsule 0   fluticasone  (FLONASE ) 50 MCG/ACT nasal spray USE 2 SPRAYS IN EACH NOSTRIL EVERY DAY (Patient taking differently: Place 2 sprays into both nostrils daily as needed.) 16 g 5   gabapentin  (NEURONTIN ) 100 MG capsule Take 100 mg by mouth 3 (three) times daily as needed.     hydrOXYzine  (ATARAX ) 25 MG tablet Take 1 tablet (25 mg total) by mouth daily as needed  for anxiety. 90 tablet 0   lamoTRIgine  (LAMICTAL ) 100 MG tablet Take 1 tablet (100 mg total) by mouth at bedtime. 90 tablet 0   lisdexamfetamine  (VYVANSE ) 40 MG capsule Take 1 capsule (40 mg total) by mouth daily. 30 capsule 0   medroxyPROGESTERone  (DEPO-PROVERA ) 150 MG/ML injection Inject 150 mg into the muscle every 3 (three) months.     metoprolol  succinate (TOPROL -XL) 50 MG 24 hr tablet Take 1 1/2 tablets (75 mg ) by mouth in the morning and 1 tablet (50 mg) in the evening. (Patient taking differently: Take 50 mg by mouth in the morning and at bedtime.)     pantoprazole  (PROTONIX ) 40 MG tablet Take 1 tablet (40 mg total) by mouth 2 (two) times daily. 60 tablet 0   phentermine (ADIPEX-P) 37.5 MG tablet 1 tablet before breakfast Orally Once a day; Duration: 30 days     No facility-administered medications prior to visit.     Allergies:   Sulfonamide derivatives and Coconut (cocos nucifera)   Past Medical  History:  Diagnosis Date   Abnormal Pap smear of cervix    age 34 with hx of cryotherapy   ADHD (attention deficit hyperactivity disorder)    Anxiety    Asthma    daily and prn inhalers   Bipolar affective disorder (HCC)    COVID 05/2020   Dental crowns present    Depression    Deviated nasal septum 05/2012   Eczema    right leg   Esophageal reflux    Fibromyalgia    Focal nodular hyperplasia of liver    Heart murmur    Hypertension    IBS (irritable bowel syndrome)    Migraine headache    with aura   Nasal turbinate hypertrophy 05/2012   bilateral   OCD (obsessive compulsive disorder)    STD (sexually transmitted disease)    Hx of abnormal pap--age 58    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     Past Surgical History:  Procedure Laterality Date   ANKLE RECONSTRUCTION Left 08/14/2021   Procedure: RECONSTRUCTION ANKLE Open debridement of left peroneal tendons;  Surgeon: Barton Drape, MD;  Location: [facility];  Service:  Orthopedics;  Laterality: Left;   ASD AND VSD REPAIR  1989   CARDIAC CATHETERIZATION  06/18/2006   CESAREAN SECTION  04/12/2009   COARCTATION OF AORTA REPAIR  1989   COLONOSCOPY N/A 05/20/2013   Procedure: COLONOSCOPY;  Surgeon: Margo LITTIE Haddock, MD;  [facility];  Service: Endoscopy;  Laterality: N/A;  115-moved to 1230 Leigh Ann notified pt   ESOPHAGOGASTRODUODENOSCOPY  04/04/2008     Normal esophagus/Mild patchy erythema in the antrum/ Normal duodenal bulb, normal small bowel biopsy   FLEXIBLE SIGMOIDOSCOPY  04/04/2008     Small internal hemorrhoids (poor bowel prep)   HEMORRHOID BANDING N/A 05/20/2013   Procedure: HEMORRHOID BANDING;  Surgeon: Margo LITTIE Haddock, MD;  [facility];  Service: Endoscopy;  Laterality: N/A;   NASAL SEPTOPLASTY W/ TURBINOPLASTY Bilateral 05/31/2012   Procedure: NASAL SEPTOPLASTY WITH TURBINATE REDUCTION;  Surgeon: Ana LELON Moccasin, MD;  Location: [facility];  Service: ENT;  Laterality: Bilateral;     Social History:  The patient  reports that she has been smoking. She uses smokeless tobacco. She reports that she does not drink alcohol and does not use drugs.   Family History:  The patient's family history includes ADD / ADHD in her brother and sister; Alcohol abuse in her father; Anxiety disorder in her brother; Bipolar disorder in her father and paternal grandmother; Cancer in her mother; Dementia in her maternal grandmother and paternal grandmother; Depression in her mother; Emphysema in her paternal grandmother; Heart disease in her maternal grandmother and paternal grandmother; Hyperlipidemia in her mother; Hypertension in her mother; Personality disorder in her father; Physical abuse in her mother; Prostate cancer in her maternal grandfather; Seizures in her daughter; Sexual abuse in her mother; Sleep apnea in her mother; Stroke in her maternal grandfather and mother; Thyroid disease in her maternal grandfather.    ROS:  Please see the  history of present illness. All other systems are reviewed and  Negative to the above problem except as noted.    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   CONTRAST:  9.5 mL Gadopiclenol  (Vueway )   COMPARISON:  October 2017   FINDINGS: Cardiovascular: Preferential opacification of the thoracic aorta. Postsurgical changes of aortic coarctation repair without complicating feature. The ascending thoracic aorta measures 3.5 cm, within normal limits. Similar measurements of the aortic root measuring up to 3.9 cm, within expected limits. Normal heart size. No pericardial effusion.   Mediastinum/Nodes: No enlarged mediastinal, hilar, or axillary lymph nodes. The thyroid gland appears normal.   Lungs/Pleura: No pleural effusion. No mass or lobar consolidation.   Musculoskeletal: No aggressive osseous lesions.   Upper abdomen: Stable appearance of previously described focal nodular hyperplasia (FNH) in the right hepatic dome.   IMPRESSION: 1. Stable postsurgical changes of aortic coarctation repair without complicating feature. No findings of thoracic aortic aneurysm. Aortic measurements remain stable since 2017 as described.      Echo: 11/12/20  eft ventricular ejection fraction, by estimation, is 65  to 70%. The left ventricle has normal function. The left ventricle has no regional wall motion abnormalities. Left ventricular diastolic parameters were normal. 1. Right ventricular systolic function is normal. The right ventricular size is normal. There is normal pulmonary artery systolic pressure. 2. 3. Right atrial size was mildly dilated. 4. The mitral valve is normal in structure. Trivial mitral valve regurgitation. Diffcult to see aortic leaflets well. . The aortic valve is tricuspid. Aortic valve regurgitation is not visualized. Mild aortic valve sclerosis is present, with no evidence of aortic valve stenosis. 5. Acoustic window is difficult for accurate measurements of aortic root s/p coarctation repair (1989) Peak gradient down descending aorta is 19 mm Hg (previous echo from 2021, peak gradient was 22 mm Hg).. Aortic dilatation noted. There is moderate dilatation of the aortic root, measuring 43 mm. 6. The inferior vena cava is normal in size with greater than 50% respiratory variability, suggesting right atrial pressure of 3 mmHg.    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  The patient does not have concerns regarding medicines.  Signed, Vina Gull, MD  03/14/2024 5:13 PM    [facility] [address] [place], [place], [place]  [address] Phone: [phone]; Fax: [phone]

## 2024-03-14 ENCOUNTER — Ambulatory Visit: Attending: Internal Medicine | Admitting: Internal Medicine

## 2024-03-14 VITALS — BP 104/66 | HR 81 | Ht 65.0 in | Wt 235.0 lb

## 2024-03-14 DIAGNOSIS — I10 Essential (primary) hypertension: Secondary | ICD-10-CM

## 2024-03-14 DIAGNOSIS — Q251 Coarctation of aorta: Secondary | ICD-10-CM

## 2024-03-14 DIAGNOSIS — Q249 Congenital malformation of heart, unspecified: Secondary | ICD-10-CM

## 2024-03-14 NOTE — Patient Instructions (Signed)
 Medication Instructions:  Your physician recommends that you continue on your current medications as directed. Please refer to the Current Medication list given to you today.  *If you need a refill on your cardiac medications before your next appointment, please call your pharmacy*  Lab Work: A1c, NMR If you have labs (blood work) drawn today and your tests are completely normal, you will receive your results only by: MyChart Message (if you have MyChart) OR A paper copy in the mail If you have any lab test that is abnormal or we need to change your treatment, we will call you to review the results.  Testing/Procedures: ECHO  Your physician has requested that you have an echocardiogram. Echocardiography is a painless test that uses sound waves to create images of your heart. It provides your doctor with information about the size and shape of your heart and how well your hearts chambers and valves are working. This procedure takes approximately one hour. There are no restrictions for this procedure. Please do NOT wear cologne, perfume, aftershave, or lotions (deodorant is allowed). Please arrive 15 minutes prior to your appointment time.  Please note: We ask at that you not bring children with you during ultrasound (echo/ vascular) testing. Due to room size and safety concerns, children are not allowed in the ultrasound rooms during exams. Our front office staff cannot provide observation of children in our lobby area while testing is being conducted. An adult accompanying a patient to their appointment will only be allowed in the ultrasound room at the discretion of the ultrasound technician under special circumstances. We apologize for any inconvenience.   Follow-Up: At [facility], you and your health needs are our priority.  As part of our continuing mission to provide you with exceptional heart care, our providers are all part of one team.  This team includes your primary  Cardiologist (physician) and Advanced Practice Providers or APPs (Physician Assistants and Nurse Practitioners) who all work together to provide you with the care you need, when you need it.  Your next appointment:   1 Year  Provider:   Dr. Okey  We recommend signing up for the patient portal called MyChart.  Sign up information is provided on this After Visit Summary.  MyChart is used to connect with patients for Virtual Visits (Telemedicine).  Patients are able to view lab/test results, encounter notes, upcoming appointments, etc.  Non-urgent messages can be sent to your provider as well.   To learn more about what you can do with MyChart, go to [email or web address].   Other Instructions none

## 2024-03-15 ENCOUNTER — Telehealth: Payer: Self-pay | Admitting: Internal Medicine

## 2024-03-15 NOTE — Telephone Encounter (Signed)
 Pt inquiring if Echo was supposed to be done now or wait until December of next year. Informed patient Dr Okey wants it done before Dec 2026 and someone would reach out to schedule that with her this week.

## 2024-03-15 NOTE — Telephone Encounter (Signed)
 Patient would like to know how soon she needs to have echo. Is she needing it now or in December, 2026? Please clarify.

## 2024-03-15 NOTE — Progress Notes (Unsigned)
 GYNECOLOGY  VISIT   HPI: 38 y.o.   Domestic Partner  Caucasian female   G1P1001 with No LMP recorded. Patient has had an injection.   here for: Vulvar biopsy     Patient noticed the area on her vulva about 6 months ago.   Not painful or itching.      GYNECOLOGIC HISTORY: No LMP recorded. Patient has had an injection. Contraception:  Depo injection  Menopausal hormone therapy:  n/a Last 2 paps:   09/19/20 neg HR HPV neg, 10/16/14 neg  History of abnormal Pap or positive HPV:  yes, cryotherapy to cervix at age 15 yo  Mammogram:  n/a        OB History     Gravida  1   Para  1   Term  1   Preterm      AB      Living  1      SAB      IAB      Ectopic      Multiple      Live Births                 Patient Active Problem List   Diagnosis Date Noted   Intractable nausea and vomiting 01/26/2024   Hypokalemia 01/26/2024   Chronic tachycardia 01/26/2024   Prolonged Q-T interval on ECG 01/26/2024   Surveillance for Depo-Provera  contraception 11/12/2023   Intentional overdose of drug in tablet form (HCC) 01/26/2017   Bipolar 1 disorder, depressed, severe (HCC) 01/26/2017   Bipolar 2 disorder (HCC) 05/16/2014   Internal hemorrhoids with complication 05/18/2013   Vasomotor rhinitis 04/15/2013   Gastritis without bleeding 03/10/2013   Anal fissure 03/10/2013   Obsessive compulsive disorder 03/07/2013   Fibromyalgia 02/06/2013   Detachment of glenoid labrum 01/13/2013   Difficulty walking 02/18/2012   Seasonal affective disorder 11/25/2011   Asthma 08/28/2010   ADHD 01/19/2008   Anxiety associated with depression 12/08/2007   RHINITIS MEDICAMENTOSA 12/08/2007   ALLERGIC RHINITIS 12/08/2007   IBS 12/08/2007   ECZEMA 12/08/2007   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   Migraine headache 04/24/2007   Essential hypertension 04/24/2007   ASVD 04/24/2007   GERD (gastroesophageal reflux disease) 04/24/2007   VSD 04/24/2007   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     Past Medical History:  Diagnosis Date   Abnormal Pap smear of cervix    age 15 with hx of cryotherapy   ADHD (attention deficit hyperactivity disorder)    Anxiety    Asthma    daily and prn inhalers   Bipolar affective disorder (HCC)    COVID 05/2020   Dental crowns present    Depression    Deviated nasal septum 05/2012   Eczema    right leg   Esophageal reflux    Fibromyalgia    Focal nodular hyperplasia of liver    Heart murmur    Hypertension    IBS (irritable bowel syndrome)    Migraine headache    with aura   Nasal turbinate hypertrophy 05/2012   bilateral   OCD (obsessive compulsive disorder)    STD (sexually transmitted disease)    Hx of abnormal pap--age 9    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     Past Surgical History:  Procedure Laterality Date   ANKLE RECONSTRUCTION Left 08/14/2021   Procedure: RECONSTRUCTION ANKLE Open debridement of left peroneal tendons;  Surgeon: Barton Drape, MD;  Location: [facility];  Service: Orthopedics;  Laterality: Left;   ASD AND VSD REPAIR  1989   CARDIAC CATHETERIZATION  06/18/2006   CESAREAN SECTION  04/12/2009   COARCTATION OF AORTA REPAIR  1989   COLONOSCOPY N/A 05/20/2013   Procedure: COLONOSCOPY;  Surgeon: Margo LITTIE Haddock, MD;  Location: [facility];  Service: Endoscopy;  Laterality: N/A;  115-moved to 1230 Leigh Ann notified pt   ESOPHAGOGASTRODUODENOSCOPY  04/04/2008     Normal esophagus/Mild patchy erythema in the antrum/ Normal duodenal bulb, normal small bowel biopsy   FLEXIBLE SIGMOIDOSCOPY  04/04/2008     Small internal hemorrhoids (poor bowel prep)   HEMORRHOID BANDING N/A 05/20/2013   Procedure: HEMORRHOID BANDING;  Surgeon: Margo LITTIE Haddock, MD;  Location: [facility];  Service: Endoscopy;  Laterality: N/A;   NASAL SEPTOPLASTY W/ TURBINOPLASTY Bilateral 05/31/2012   Procedure: NASAL SEPTOPLASTY WITH TURBINATE REDUCTION;  Surgeon: Ana LELON Moccasin, MD;  Location: [facility];  Service: ENT;  Laterality: Bilateral;    Current Outpatient Medications  Medication Sig Dispense Refill   acetaminophen  (TYLENOL ) 500 MG tablet Take 1,000-2,000 mg by mouth every 6 (six) hours as needed for mild pain, moderate pain, fever or headache.     ARIPiprazole  (ABILIFY ) 15 MG tablet Take 1 tablet (15 mg total) by mouth daily. 90 tablet 0   FLUoxetine  (PROZAC ) 40 MG capsule Take 1 capsule (40 mg total) by mouth daily. 90 capsule 0   fluticasone  (FLONASE ) 50 MCG/ACT nasal spray USE 2 SPRAYS IN EACH NOSTRIL EVERY DAY (Patient taking differently: Place 2 sprays into both nostrils daily as needed.) 16 g 5   gabapentin  (NEURONTIN ) 100 MG capsule Take 100 mg by mouth 3 (three) times daily as needed.     hydrOXYzine  (ATARAX ) 25 MG tablet Take 1 tablet (25 mg total) by mouth daily as needed for anxiety. 90 tablet 0   lamoTRIgine  (LAMICTAL ) 100 MG tablet Take 1 tablet (100 mg total) by mouth at bedtime. 90 tablet 0   lisdexamfetamine  (VYVANSE ) 40 MG capsule Take 1 capsule (40 mg total) by mouth daily. 30 capsule 0   medroxyPROGESTERone  (DEPO-PROVERA ) 150 MG/ML injection Inject 150 mg into the muscle every 3 (three) months.     metoprolol  succinate (TOPROL -XL) 50 MG 24 hr tablet Take 1 1/2 tablets (75 mg ) by mouth in the morning and 1 tablet (50 mg) in the evening. (Patient taking differently: Take 50 mg by mouth in the morning and at bedtime.)     pantoprazole  (PROTONIX ) 40 MG tablet Take 1 tablet (40 mg total) by mouth 2 (two) times daily. 60 tablet 0   phentermine (ADIPEX-P) 37.5 MG tablet 1 tablet before breakfast Orally Once a day; Duration: 30 days     No current facility-administered medications for this visit.     ALLERGIES: Sulfonamide derivatives and Coconut (cocos nucifera)  Family History  Problem Relation Age of Onset   Cancer Mother        leukemia   Hyperlipidemia Mother    Hypertension Mother    Depression Mother    Physical abuse Mother    Sexual  abuse Mother        possibly   Sleep apnea Mother    Stroke Mother    Personality disorder Father    Bipolar disorder Father    Alcohol abuse Father    ADD / ADHD Sister    Anxiety disorder Brother    ADD / ADHD Brother    Heart disease Maternal Grandmother  Dementia Maternal Grandmother    Stroke Maternal Grandfather    Prostate cancer Maternal Grandfather    Thyroid disease Maternal Grandfather    Emphysema Paternal Grandmother    Heart disease Paternal Grandmother    Bipolar disorder Paternal Grandmother    Dementia Paternal Grandmother    Seizures Daughter    Drug abuse Neg Hx    OCD Neg Hx    Paranoid behavior Neg Hx    Schizophrenia Neg Hx     Social History   Socioeconomic History   Marital status: Media Planner    Spouse name: Not on file   Number of children: 1   Years of education: GED   Highest education level: Not on file  Occupational History   Occupation: Librarian, Academic  Tobacco Use   Smoking status: Every Day   Smokeless tobacco: Current   Tobacco comments:    vape  Vaping Use   Vaping status: Every Day  Substance and Sexual Activity   Alcohol use: No    Comment: recovering alcoholic 8 mos sober as of 04/15/17   Drug use: No   Sexual activity: Yes    Partners: Male    Birth control/protection: Injection    Comment: Depo Provera   Other Topics Concern   Not on file  Social History Narrative      Social Drivers of Health   Tobacco Use: High Risk (03/16/2024)   Patient History    Smoking Tobacco Use: Every Day    Smokeless Tobacco Use: Current    Passive Exposure: Not on file  Financial Resource Strain: Not on file  Food Insecurity: Not on file  Transportation Needs: Not on file  Physical Activity: Not on file  Stress: Not on file  Social Connections: Not on file  Intimate Partner Violence: Not on file  Depression (PHQ2-9): Low Risk (02/10/2024)   Depression (PHQ2-9)    PHQ-2 Score: 0  Alcohol Screen: Not on file  Housing: Not on file   Utilities: Not on file  Health Literacy: Not on file    Review of Systems  All other systems reviewed and are negative.   PHYSICAL EXAMINATION:   BP 122/80 (BP Location: Left Arm, Patient Position: Sitting)   Pulse 84   SpO2 99%     General appearance: alert, cooperative and appears stated age   Pelvic: External genitalia:   4.5 mm area of thickened epithelium of mid perineum with tiny ulceration/fissure in the center. Slightly tender.          Vulvar biopsy.  Consent and time out done.  Hibiclens  prep.  Local 1% lidocaine , lot [id number], exp June 28, 2025. 5 mm punch biopsy to excise the area.  Tissue to pathology. 2 interrupted sutures of 3/0 Vicryl.  Minimal EBL.  No complications.   Chaperone was present for exam:  Kari HERO, CMA  ASSESSMENT:  Vulvar lesion.   PLAN:  Fu biopsy.  Final plan to follow.

## 2024-03-16 ENCOUNTER — Other Ambulatory Visit (HOSPITAL_COMMUNITY)
Admission: RE | Admit: 2024-03-16 | Discharge: 2024-03-16 | Disposition: A | Source: Ambulatory Visit | Attending: Obstetrics and Gynecology | Admitting: Obstetrics and Gynecology

## 2024-03-16 ENCOUNTER — Ambulatory Visit: Admitting: Obstetrics and Gynecology

## 2024-03-16 ENCOUNTER — Encounter: Payer: Self-pay | Admitting: Obstetrics and Gynecology

## 2024-03-16 VITALS — BP 122/80 | HR 84

## 2024-03-16 DIAGNOSIS — N9089 Other specified noninflammatory disorders of vulva and perineum: Secondary | ICD-10-CM | POA: Insufficient documentation

## 2024-03-16 DIAGNOSIS — Z01812 Encounter for preprocedural laboratory examination: Secondary | ICD-10-CM

## 2024-03-16 LAB — PREGNANCY, URINE: Preg Test, Ur: NEGATIVE

## 2024-03-16 NOTE — Patient Instructions (Signed)
 Vulva Biopsy, Care After After a vulva biopsy, it is common to have: Slight bleeding from the biopsy site. Soreness or slight pain at the biopsy site. Follow these instructions at home: Your doctor may give you more instructions. If you have problems, contact your doctor. Biopsy site care  Follow instructions from your doctor about how to take care of your biopsy site. Make sure you: Clean the area using water and mild soap two times a day or as told by your doctor. Gently pat the area dry. You may shower 24 hours after the procedure. If you were prescribed an antibiotic ointment, apply it as told by your doctor. Do not stop using the antibiotic even if your condition gets better. If told by your doctor, take a warm water bath (sitz bath) to help with pain and soreness. A sitz bath is taken while you are sitting down. Do this as often as told by your doctor. The water should only come up to your hips and cover your butt. You may pat the area dry with a soft, clean towel. Leave stitches (sutures) or skin glue in place for at least 2 weeks. Leave tape strips alone unless you are told to take them off. You may trim the edges of the tape strips if they curl up. Check your biopsy area every day for signs of infection. It may be helpful to use a handheld mirror to do this. Check for: Redness, swelling, or more pain. More fluid or blood. Warmth. Pus or a bad smell. Do not rub the biopsy area after peeing (urinating). Gently pat the area dry, or use a bottle filled with warm water (peri bottle) to clean the area. Gently wipe from front to back. Lifestyle Wear loose, cotton underwear. Do not wear tight pants. For at least 1 week or until your doctor says it is okay: Do not use a tampon, douche, or put anything in your vagina. Do not have sex. Until your doctor says it is okay: Do not exercise. Do not swim or use a hot tub. General instructions Take over-the-counter and prescription  medicines only as told by your doctor. Drink enough fluid to keep your pee (urine) pale yellow. Use a sanitary pad until the bleeding stops. If told, put ice on the biopsy site. To do this: Place ice in a plastic bag. Place a towel between your skin and the bag. Leave the ice on for 20 minutes, 2-3 times a day. Take off the ice if your skin turns bright red. This is very important. If you cannot feel pain, heat, or cold, you have a greater risk of damage to the area. Keep all follow-up visits. Contact a doctor if: You have redness, swelling, or more pain around your biopsy site. You have more fluid or blood coming from your biopsy site. Your biopsy site feels warm when you touch it. Medicines or ice packs do not help with your pain. You have a fever or chills. Get help right away if: You have a lot of bleeding from the vulva. You have pus or a bad smell coming from your biopsy site. You have pain in your belly (abdomen). Summary After the procedure, it is common to have slight bleeding and soreness at the biopsy site. Follow all instructions as told by your doctor. Take sitz baths as told by your doctor. Leave any stitches in place. Check your biopsy site for infection. Signs include redness, swelling, more pain, more fluid or blood, or warmth. Get help  right away if you have a lot of bleeding, pus or a bad smell, or pain in your belly. This information is not intended to replace advice given to you by your health care provider. Make sure you discuss any questions you have with your health care provider. Document Revised: 12/04/2020 Document Reviewed: 12/04/2020 Elsevier Patient Education  2024 [employer].

## 2024-03-17 DIAGNOSIS — I493 Ventricular premature depolarization: Secondary | ICD-10-CM

## 2024-03-18 LAB — NMR, LIPOPROFILE
Cholesterol, Total: 199 mg/dL (ref 100–199)
HDL Particle Number: 34.7 umol/L
HDL-C: 57 mg/dL
LDL Particle Number: 1034 nmol/L — AB
LDL Size: 21.8 nm
LDL-C (NIH Calc): 122 mg/dL — AB (ref 0–99)
LP-IR Score: 27
Small LDL Particle Number: 281 nmol/L
Triglycerides: 115 mg/dL (ref 0–149)

## 2024-03-18 LAB — HEMOGLOBIN A1C
Est. average glucose Bld gHb Est-mCnc: 100 mg/dL
Hgb A1c MFr Bld: 5.1 % (ref 4.8–5.6)

## 2024-03-18 LAB — SURGICAL PATHOLOGY

## 2024-03-21 ENCOUNTER — Ambulatory Visit: Payer: Self-pay | Admitting: Internal Medicine

## 2024-03-21 ENCOUNTER — Ambulatory Visit: Payer: Self-pay | Admitting: Obstetrics and Gynecology

## 2024-03-21 DIAGNOSIS — I10 Essential (primary) hypertension: Secondary | ICD-10-CM

## 2024-03-21 DIAGNOSIS — Q249 Congenital malformation of heart, unspecified: Secondary | ICD-10-CM

## 2024-03-21 DIAGNOSIS — I493 Ventricular premature depolarization: Secondary | ICD-10-CM

## 2024-03-25 ENCOUNTER — Other Ambulatory Visit (HOSPITAL_COMMUNITY): Payer: Self-pay | Admitting: Psychiatry

## 2024-03-25 DIAGNOSIS — F900 Attention-deficit hyperactivity disorder, predominantly inattentive type: Secondary | ICD-10-CM

## 2024-03-29 ENCOUNTER — Telehealth (HOSPITAL_COMMUNITY): Payer: Self-pay

## 2024-03-29 NOTE — Telephone Encounter (Signed)
 Non urgent question from patient - I did advise her that you would not be in the office until Friday. Patient voiced her understanding. She saw her heart doctor on 12/15 - there is a note in EPIC, she also did a heart monitor for 4 days that came back with no concerns. Patient states that her cardiac doctor is fine with her taking Vyvance and patient wants to know if you need a note from the provider. Please review and advise, thank you

## 2024-04-07 ENCOUNTER — Other Ambulatory Visit (HOSPITAL_COMMUNITY): Payer: Self-pay | Admitting: Psychiatry

## 2024-04-07 ENCOUNTER — Other Ambulatory Visit: Payer: Self-pay | Admitting: Internal Medicine

## 2024-04-07 DIAGNOSIS — F3131 Bipolar disorder, current episode depressed, mild: Secondary | ICD-10-CM

## 2024-04-07 DIAGNOSIS — F419 Anxiety disorder, unspecified: Secondary | ICD-10-CM

## 2024-04-08 ENCOUNTER — Ambulatory Visit (HOSPITAL_COMMUNITY): Admitting: Psychiatry

## 2024-04-14 ENCOUNTER — Other Ambulatory Visit: Payer: Self-pay

## 2024-04-14 ENCOUNTER — Ambulatory Visit (HOSPITAL_COMMUNITY): Admitting: Psychiatry

## 2024-04-14 ENCOUNTER — Encounter (HOSPITAL_COMMUNITY): Payer: Self-pay | Admitting: Psychiatry

## 2024-04-14 VITALS — BP 118/82 | HR 91 | Ht 65.0 in | Wt 238.0 lb

## 2024-04-14 DIAGNOSIS — F338 Other recurrent depressive disorders: Secondary | ICD-10-CM

## 2024-04-14 DIAGNOSIS — F3131 Bipolar disorder, current episode depressed, mild: Secondary | ICD-10-CM

## 2024-04-14 DIAGNOSIS — F419 Anxiety disorder, unspecified: Secondary | ICD-10-CM | POA: Diagnosis not present

## 2024-04-14 DIAGNOSIS — F900 Attention-deficit hyperactivity disorder, predominantly inattentive type: Secondary | ICD-10-CM | POA: Diagnosis not present

## 2024-04-14 MED ORDER — ARIPIPRAZOLE 15 MG PO TABS: 15.0000 mg | ORAL_TABLET | Freq: Every day | ORAL | 0 refills | Status: AC

## 2024-04-14 MED ORDER — HYDROXYZINE HCL 25 MG PO TABS: 25.0000 mg | ORAL_TABLET | Freq: Every day | ORAL | 0 refills | Status: AC | PRN

## 2024-04-14 MED ORDER — LAMOTRIGINE 150 MG PO TABS: 150.0000 mg | ORAL_TABLET | Freq: Every day | ORAL | 0 refills | Status: AC

## 2024-04-14 MED ORDER — FLUOXETINE HCL 40 MG PO CAPS: 40.0000 mg | ORAL_CAPSULE | Freq: Every day | ORAL | 0 refills | Status: AC

## 2024-04-14 NOTE — Progress Notes (Signed)
 [facility] MD/PA/NP OP Progress Note  04/14/2024 8:59 AM Kristin Stephens  MRN:  [id number]  Chief Complaint:  Chief Complaint  Patient presents with   ADHD   Follow-up   HPI: Patient came to the office for her follow-up appointment.  She reported Christmas was okay.  She was able to see her daughter Shelba who is going to be 37 year old this February.  Patient is concerned about her sister who she believes started drinking and not working and she is concerned about her daughter who stays with her sister.  Patient started seeing therapist at [facility] counseling and that has been helpful.  She started DBT and she is able to control her triggers.  She is struggling with ADHD.  She was taking Vyvanse  however due to recent cardiology visit with prolonged QT, premature ventricle complexes, positive for cannabis and taking phentermine we have discontinued Vyvanse .  Patient reported her job is going okay but sometimes she struggle with time management.  She had a good support from her boyfriend and relationship is going very well.  Recently they celebrated 6 years of relationship.  She admitted smoking Gummies to calm herself and to help pain.  She has fibromyalgia and she takes gabapentin .  She reported her sleep is okay.  She admitted getting some time emotional tearful when she think about her daughter.  In the past she had tried Zepbound that causes nausea and now she is taking phentermine.  She is able to lost few pounds and she like to keep the phentermine from weight loss management.  She denies any hallucination, paranoia, active or passive suicidal thoughts or homicidal thoughts.  Recently had blood work and her hemoglobin A1c 5.  Patient reported this time she had extralight at home and that help dealing with seasonal affective disorder.  She is still try to go outside when weather is nice sunny and not as cool.   Visit Diagnosis:    ICD-10-CM   1. Bipolar affective disorder, currently depressed, mild  (HCC)  F31.31 ARIPiprazole  (ABILIFY ) 15 MG tablet    FLUoxetine  (PROZAC ) 40 MG capsule    hydrOXYzine  (ATARAX ) 25 MG tablet    lamoTRIgine  (LAMICTAL ) 150 MG tablet    2. Anxiety  F41.9 FLUoxetine  (PROZAC ) 40 MG capsule    hydrOXYzine  (ATARAX ) 25 MG tablet    lamoTRIgine  (LAMICTAL ) 150 MG tablet    3. Attention deficit hyperactivity disorder (ADHD), predominantly inattentive type  F90.0 FLUoxetine  (PROZAC ) 40 MG capsule    lamoTRIgine  (LAMICTAL ) 150 MG tablet    4. Seasonal affective disorder  F33.8 FLUoxetine  (PROZAC ) 40 MG capsule        Past Psychiatric History:  H/O mania, depression, anger, overdose and slashing her wrist.  Did IOP in 2016. Inpatient in October 2018. H/O ETOH. Took Adderall, Ritalin , Ativan , Gabapentin , Wellbutrin but limited response.      Past Medical History:  Past Medical History:  Diagnosis Date   Abnormal Pap smear of cervix    age 82 with hx of cryotherapy   ADHD (attention deficit hyperactivity disorder)    Anxiety    Asthma    daily and prn inhalers   Bipolar affective disorder (HCC)    COVID 05/2020   Dental crowns present    Depression    Deviated nasal septum 05/2012   Eczema    right leg   Esophageal reflux    Fibromyalgia    Focal nodular hyperplasia of liver    Heart murmur    Hypertension  IBS (irritable bowel syndrome)    Migraine headache    with aura   Nasal turbinate hypertrophy 05/2012   bilateral   OCD (obsessive compulsive disorder)    STD (sexually transmitted disease)    Hx of abnormal pap--age 55    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     Past Surgical History:  Procedure Laterality Date   ANKLE RECONSTRUCTION Left 08/14/2021   Procedure: RECONSTRUCTION ANKLE Open debridement of left peroneal tendons;  Surgeon: Barton Drape, MD;  Location: [facility];  Service: Orthopedics;  Laterality: Left;   ASD AND VSD REPAIR  1989   CARDIAC CATHETERIZATION  06/18/2006    CESAREAN SECTION  04/12/2009   COARCTATION OF AORTA REPAIR  1989   COLONOSCOPY N/A 05/20/2013   Procedure: COLONOSCOPY;  Surgeon: Margo LITTIE Haddock, MD;  [facility];  Service: Endoscopy;  Laterality: N/A;  115-moved to 1230 Leigh Ann notified pt   ESOPHAGOGASTRODUODENOSCOPY  04/04/2008     Normal esophagus/Mild patchy erythema in the antrum/ Normal duodenal bulb, normal small bowel biopsy   FLEXIBLE SIGMOIDOSCOPY  04/04/2008     Small internal hemorrhoids (poor bowel prep)   HEMORRHOID BANDING N/A 05/20/2013   Procedure: HEMORRHOID BANDING;  Surgeon: Margo LITTIE Haddock, MD;  [facility];  Service: Endoscopy;  Laterality: N/A;   NASAL SEPTOPLASTY W/ TURBINOPLASTY Bilateral 05/31/2012   Procedure: NASAL SEPTOPLASTY WITH TURBINATE REDUCTION;  Surgeon: Ana LELON Moccasin, MD;  Location: [facility];  Service: ENT;  Laterality: Bilateral;    Family Psychiatric History: Reviewed.  Family History:  Family History  Problem Relation Age of Onset   Cancer Mother        leukemia   Hyperlipidemia Mother    Hypertension Mother    Depression Mother    Physical abuse Mother    Sexual abuse Mother        possibly   Sleep apnea Mother    Stroke Mother    Personality disorder Father    Bipolar disorder Father    Alcohol abuse Father    ADD / ADHD Sister    Anxiety disorder Brother    ADD / ADHD Brother    Heart disease Maternal Grandmother    Dementia Maternal Grandmother    Stroke Maternal Grandfather    Prostate cancer Maternal Grandfather    Thyroid disease Maternal Grandfather    Emphysema Paternal Grandmother    Heart disease Paternal Grandmother    Bipolar disorder Paternal Grandmother    Dementia Paternal Grandmother    Seizures Daughter    Drug abuse Neg Hx    OCD Neg Hx    Paranoid behavior Neg Hx    Schizophrenia Neg Hx     Social History:  Social History   Socioeconomic History   Marital status: Media Planner    Spouse name: Not on file   Number  of children: 1   Years of education: GED   Highest education level: Not on file  Occupational History   Occupation: Librarian, Academic  Tobacco Use   Smoking status: Every Day   Smokeless tobacco: Current   Tobacco comments:    vape  Vaping Use   Vaping status: Every Day  Substance and Sexual Activity   Alcohol use: No    Comment: recovering alcoholic 8 mos sober as of 04/15/17   Drug use: No   Sexual activity: Yes    Partners: Male    Birth control/protection: Injection  Comment: Depo Provera   Other Topics Concern   Not on file  Social History Narrative      Social Drivers of Health   Tobacco Use: High Risk (04/14/2024)   Patient History    Smoking Tobacco Use: Every Day    Smokeless Tobacco Use: Current    Passive Exposure: Not on file  Financial Resource Strain: Not on file  Food Insecurity: Not on file  Transportation Needs: Not on file  Physical Activity: Not on file  Stress: Not on file  Social Connections: Not on file  Depression (PHQ2-9): Low Risk (02/10/2024)   Depression (PHQ2-9)    PHQ-2 Score: 0  Alcohol Screen: Not on file  Housing: Not on file  Utilities: Not on file  Health Literacy: Not on file    Allergies:  Allergies  Allergen Reactions   Sulfonamide Derivatives Swelling and Other (See Comments)    SWELLING OF EYES WITH OPHTHALMIC SULFA   Coconut (Cocos Nucifera) Dermatitis and Rash    Metabolic Disorder Labs: Lab Results  Component Value Date   HGBA1C 5.1 03/17/2024   MPG 97 08/28/2011   No results found for: PROLACTIN Lab Results  Component Value Date   CHOL 175 12/30/2018   TRIG 84 12/30/2018   HDL 45 12/30/2018   CHOLHDL 3.9 12/30/2018   VLDL 14 02/06/2007   LDLCALC 114 (H) 12/30/2018   LDLCALC 108 (H) 06/04/2017   Lab Results  Component Value Date   TSH 1.299 03/31/2008   TSH 1.424 Test methodology is 3rd generation TSH 02/05/2007    Therapeutic Level Labs: No results found for: LITHIUM No results found for:  VALPROATE No results found for: CBMZ  Current Medications: Current Outpatient Medications  Medication Sig Dispense Refill   acetaminophen  (TYLENOL ) 500 MG tablet Take 1,000-2,000 mg by mouth every 6 (six) hours as needed for mild pain, moderate pain, fever or headache.     ARIPiprazole  (ABILIFY ) 15 MG tablet Take 1 tablet (15 mg total) by mouth daily. 90 tablet 0   FLUoxetine  (PROZAC ) 40 MG capsule Take 1 capsule (40 mg total) by mouth daily. 90 capsule 0   fluticasone  (FLONASE ) 50 MCG/ACT nasal spray USE 2 SPRAYS IN EACH NOSTRIL EVERY DAY (Patient taking differently: Place 2 sprays into both nostrils daily as needed.) 16 g 5   gabapentin  (NEURONTIN ) 100 MG capsule Take 100 mg by mouth 3 (three) times daily as needed.     hydrOXYzine  (ATARAX ) 25 MG tablet Take 1 tablet (25 mg total) by mouth daily as needed for anxiety. 90 tablet 0   lamoTRIgine  (LAMICTAL ) 100 MG tablet Take 1 tablet (100 mg total) by mouth at bedtime. 90 tablet 0   lisdexamfetamine  (VYVANSE ) 40 MG capsule Take 1 capsule (40 mg total) by mouth daily. 30 capsule 0   medroxyPROGESTERone  (DEPO-PROVERA ) 150 MG/ML injection Inject 150 mg into the muscle every 3 (three) months.     metoprolol  succinate (TOPROL -XL) 50 MG 24 hr tablet TAKE 1 TABLET BY MOUTH 2 TIMES DAILY 180 tablet 3   pantoprazole  (PROTONIX ) 40 MG tablet Take 1 tablet (40 mg total) by mouth 2 (two) times daily. 60 tablet 0   phentermine (ADIPEX-P) 37.5 MG tablet 1 tablet before breakfast Orally Once a day; Duration: 30 days     No current facility-administered medications for this visit.     Musculoskeletal: Strength & Muscle Tone: within normal limits Gait & Station: normal Patient leans: N/A Psychiatric Specialty Exam: Physical Exam  Review of Systems  Blood pressure 118/82, pulse  91, height 5' 5 (1.651 m), weight 238 lb (108 kg).Body mass index is 39.61 kg/m.  General Appearance: Well Groomed  Eye Contact:  Good  Speech:  Normal Rate  Volume:   Normal  Mood:  Anxious and Dysphoric  Affect:  Appropriate  Thought Process:  Goal Directed  Orientation:  Full (Time, Place, and Person)  Thought Content:  WDL  Suicidal Thoughts:  No  Homicidal Thoughts:  No  Memory:  Immediate;   Good Recent;   Good Remote;   Good  Judgement:  Intact  Insight:  Present  Psychomotor Activity:  Normal  Concentration:  Concentration: Fair and Attention Span: Fair  Recall:  Good  Fund of Knowledge:  Good  Language:  Good  Akathisia:  No  Handed:  Right  AIMS (if indicated):     Assets:  Communication Skills Desire for Improvement Housing Resilience Social Support Talents/Skills Transportation  ADL's:  Intact  Cognition:  WNL  Sleep:   Okay     Screenings: AIMS    Flowsheet Row [facility]  AIMS Total Score 0   AUDIT    Flowsheet Row [facility]  Alcohol Use Disorder Identification Test Final Score (AUDIT) 0   Mini-Mental    Flowsheet Row Office Visit from 07/31/2016 in [facility]  Total Score (max 30 points ) 29   PHQ2-9    Flowsheet Row Office Visit from 02/10/2024 in [facility] Office Visit from 03/02/2017 in [facility]  PHQ-2 Total Score 0 2  PHQ-9 Total Score -- 11   Flowsheet Row ED to Hosp-Admission (Discharged) from 01/26/2024 in [facility] from 12/29/2023 in [facility] ED from 12/07/2022 in [facility] at [facility]  C-SSRS RISK CATEGORY No Risk No Risk No Risk     Assessment and Plan: Patient is 39 year old Caucasian female with a history of migraine headaches, hypertension, ASVD, VSD, coarctation of aorta, prolonged QT, GERD, IBS, bipolar disorder, anxiety and ADHD.  I reviewed collateral  information and blood work results.  Her hemoglobin A1c 5.0.  She is taking phentermine and admitted using delta 8 Gummies.  Recently had EKG and history of premature ventricle complexes.  She like to go back on Vyvanse  which we discontinued but given the above factors I recommend not to continue.  She like other options but unfortunately due to prolonged QT cannot provide Strattera .  She is able to keep her job and no issues other than some time time management.  Discussed frequent breaks may help to finish her task.  She admitted dysphoria because of situation with her sister and her daughter.  She is not happy as mother taking a neutral approach and not helping as much.  She is thinking to consider taking her sister to the court for custody.  She is in therapy with Melissa at [facility] counseling and going to discuss with her.  Recommend trial Lamictal  higher dose since it will help her mood depression.  She agreed to give a try.  Continue Lamictal  but higher dose to 150, Prozac  40 mg daily, Abilify  15 mg daily and hydroxyzine  25 mg as needed.  Patient like to keep the gabapentin , phentermine from other providers.  Recommend to call back if she is any question or any concern.  Patient is also using  delta 8 Gummies which she believes helped her chronic pain fibromyalgia.  Will follow-up in 3 months unless patient need a sooner appointment.   Collaboration of Care: Collaboration of Care: Other provider involved in patient's care AEB notes are available in epic to review.  Patient/Guardian was advised Release of Information must be obtained prior to any record release in order to collaborate their care with an outside provider. Patient/Guardian was advised if they have not already done so to contact the registration department to sign all necessary forms in order for us  to release information regarding their care.   Consent: Patient/Guardian gives verbal consent for treatment and assignment of benefits for  services provided during this visit. Patient/Guardian expressed understanding and agreed to proceed.   I provided 31 minutes face-to-face time during this encounter.  Leni ONEIDA Client, MD 04/14/2024, 8:59 AM

## 2024-04-15 ENCOUNTER — Ambulatory Visit (HOSPITAL_COMMUNITY)
Admission: RE | Admit: 2024-04-15 | Discharge: 2024-04-15 | Disposition: A | Discharge status: Home / Self Care | Source: Ambulatory Visit | Attending: Internal Medicine | Admitting: Internal Medicine

## 2024-04-15 DIAGNOSIS — Q249 Congenital malformation of heart, unspecified: Secondary | ICD-10-CM | POA: Diagnosis present

## 2024-04-15 DIAGNOSIS — I351 Nonrheumatic aortic (valve) insufficiency: Secondary | ICD-10-CM | POA: Diagnosis not present

## 2024-04-15 DIAGNOSIS — Q251 Coarctation of aorta: Secondary | ICD-10-CM | POA: Diagnosis present

## 2024-04-15 LAB — ECHOCARDIOGRAM COMPLETE
Area-P 1/2: 3.06 cm2
S' Lateral: 3.7 cm

## 2024-04-27 ENCOUNTER — Ambulatory Visit (INDEPENDENT_AMBULATORY_CARE_PROVIDER_SITE_OTHER)

## 2024-04-27 DIAGNOSIS — Z3042 Encounter for surveillance of injectable contraceptive: Secondary | ICD-10-CM | POA: Diagnosis not present

## 2024-04-27 MED ORDER — MEDROXYPROGESTERONE ACETATE 150 MG/ML IM SUSY
150.0000 mg | PREFILLED_SYRINGE | Freq: Once | INTRAMUSCULAR | Status: AC
  Administered 2024-04-27: 150 mg via INTRAMUSCULAR

## 2024-04-27 NOTE — Progress Notes (Signed)
 Medroxyprogesterone  acetate 150 mg given IM RUOQ.  Patient tolerated injection well.   AEX 02/10/24 Dr. Nikki Next depo due 07/13/24-04/29/2

## 2024-05-05 ENCOUNTER — Ambulatory Visit: Admitting: Emergency Medicine

## 2024-05-13 ENCOUNTER — Ambulatory Visit: Admitting: Gastroenterology

## 2024-07-13 ENCOUNTER — Ambulatory Visit

## 2024-07-14 ENCOUNTER — Ambulatory Visit (HOSPITAL_COMMUNITY): Admitting: Psychiatry
